# Patient Record
Sex: Female | Born: 1970 | Race: White | Hispanic: No
Health system: Southern US, Community
[De-identification: ages and names within clinical notes are randomized; demographics above are authoritative.]

## PROBLEM LIST (undated history)

## (undated) DIAGNOSIS — I422 Other hypertrophic cardiomyopathy: Secondary | ICD-10-CM

## (undated) DIAGNOSIS — I4819 Other persistent atrial fibrillation: Secondary | ICD-10-CM

## (undated) DIAGNOSIS — R000 Tachycardia, unspecified: Secondary | ICD-10-CM

## (undated) DIAGNOSIS — Z8719 Personal history of other diseases of the digestive system: Secondary | ICD-10-CM

## (undated) DIAGNOSIS — R931 Abnormal findings on diagnostic imaging of heart and coronary circulation: Secondary | ICD-10-CM

## (undated) DIAGNOSIS — R21 Rash and other nonspecific skin eruption: Secondary | ICD-10-CM

## (undated) DIAGNOSIS — I517 Cardiomegaly: Secondary | ICD-10-CM

## (undated) DIAGNOSIS — R011 Cardiac murmur, unspecified: Secondary | ICD-10-CM

## (undated) DIAGNOSIS — T4145XA Adverse effect of unspecified anesthetic, initial encounter: Secondary | ICD-10-CM

## (undated) DIAGNOSIS — I483 Typical atrial flutter: Secondary | ICD-10-CM

## (undated) DIAGNOSIS — Z720 Tobacco use: Secondary | ICD-10-CM

## (undated) DIAGNOSIS — I5030 Unspecified diastolic (congestive) heart failure: Secondary | ICD-10-CM

## (undated) DIAGNOSIS — T8859XA Other complications of anesthesia, initial encounter: Secondary | ICD-10-CM

## (undated) DIAGNOSIS — I639 Cerebral infarction, unspecified: Secondary | ICD-10-CM

## (undated) HISTORY — PX: ABDOMINAL HYSTERECTOMY: SHX81

## (undated) HISTORY — DX: Unspecified diastolic (congestive) heart failure: I50.30

## (undated) HISTORY — DX: Morbid (severe) obesity due to excess calories: E66.01

## (undated) HISTORY — PX: ANKLE SURGERY: SHX546

## (undated) HISTORY — DX: Cardiomegaly: I51.7

## (undated) HISTORY — DX: Cerebral infarction, unspecified: I63.9

## (undated) HISTORY — PX: TUBAL LIGATION: SHX77

## (undated) HISTORY — PX: HERNIA REPAIR: SHX51

## (undated) HISTORY — DX: Cardiac murmur, unspecified: R01.1

## (undated) HISTORY — DX: Tachycardia, unspecified: R00.0

## (undated) HISTORY — DX: Other persistent atrial fibrillation: I48.19

## (undated) HISTORY — DX: Tobacco use: Z72.0

---

## 2005-01-28 ENCOUNTER — Emergency Department: Payer: Self-pay | Admitting: Emergency Medicine

## 2005-01-30 ENCOUNTER — Emergency Department: Payer: Self-pay | Admitting: Emergency Medicine

## 2005-03-16 ENCOUNTER — Inpatient Hospital Stay: Payer: Self-pay | Admitting: Orthopedic Surgery

## 2006-08-28 ENCOUNTER — Ambulatory Visit: Payer: Self-pay | Admitting: Specialist

## 2008-04-24 ENCOUNTER — Emergency Department: Payer: Self-pay | Admitting: Unknown Physician Specialty

## 2008-05-21 ENCOUNTER — Ambulatory Visit: Payer: Self-pay | Admitting: Psychiatry

## 2010-11-14 ENCOUNTER — Ambulatory Visit: Payer: Self-pay | Admitting: Nephrology

## 2011-05-20 ENCOUNTER — Encounter: Payer: Self-pay | Admitting: Cardiovascular Disease

## 2011-05-21 ENCOUNTER — Encounter: Payer: Self-pay | Admitting: Cardiovascular Disease

## 2011-05-21 ENCOUNTER — Inpatient Hospital Stay: Payer: Self-pay | Admitting: *Deleted

## 2011-05-21 DIAGNOSIS — R7989 Other specified abnormal findings of blood chemistry: Secondary | ICD-10-CM

## 2011-05-21 DIAGNOSIS — I059 Rheumatic mitral valve disease, unspecified: Secondary | ICD-10-CM

## 2011-06-05 ENCOUNTER — Encounter: Payer: Self-pay | Admitting: Cardiovascular Disease

## 2011-06-05 ENCOUNTER — Ambulatory Visit (INDEPENDENT_AMBULATORY_CARE_PROVIDER_SITE_OTHER): Payer: 59 | Admitting: Cardiovascular Disease

## 2011-06-05 VITALS — BP 133/84 | HR 67 | Ht 66.0 in | Wt 320.5 lb

## 2011-06-05 DIAGNOSIS — R000 Tachycardia, unspecified: Secondary | ICD-10-CM

## 2011-06-05 DIAGNOSIS — I272 Pulmonary hypertension, unspecified: Secondary | ICD-10-CM

## 2011-06-05 DIAGNOSIS — E669 Obesity, unspecified: Secondary | ICD-10-CM

## 2011-06-05 DIAGNOSIS — I2789 Other specified pulmonary heart diseases: Secondary | ICD-10-CM

## 2011-06-05 DIAGNOSIS — R0602 Shortness of breath: Secondary | ICD-10-CM | POA: Insufficient documentation

## 2011-06-05 DIAGNOSIS — F17200 Nicotine dependence, unspecified, uncomplicated: Secondary | ICD-10-CM

## 2011-06-05 MED ORDER — FUROSEMIDE 40 MG PO TABS: 40.0000 mg | ORAL_TABLET | Freq: Every day | ORAL | Status: DC

## 2011-06-05 MED ORDER — METOPROLOL TARTRATE 25 MG PO TABS: 25.0000 mg | ORAL_TABLET | Freq: Three times a day (TID) | ORAL | Status: DC

## 2011-06-05 MED ORDER — POTASSIUM CHLORIDE CRYS ER 20 MEQ PO TBCR: 20.0000 meq | EXTENDED_RELEASE_TABLET | Freq: Every day | ORAL | Status: DC

## 2011-06-05 NOTE — Assessment & Plan Note (Signed)
We have encouraged continued exercise, careful diet management in an effort to lose weight. 

## 2011-06-05 NOTE — Progress Notes (Signed)
Patient ID: Hannah Zimmerman, female    DOB: 1971/07/21, 40 y.o.   MRN: [id number]  HPI Comments: [name] is a very pleasant 40 year old woman with history of obesity, Smoking, presented to the hospital with palpitations on May 21 2011. She had borderline elevated d-dimer and CT scan showed no embolus. Borderline elevated cardiac enzymes were felt to tachycardia. She was started on low-dose metoprolol tartrate. Echocardiogram showed normal LV systolic function, mild LVH, significantly elevated right ventricular systolic pressures estimated greater than 60 mm of mercury. BNP was 2500. She was started on Lasix 40 mg daily with potassium. She presents to the office to establish care.  Since his discharge, she reports that her breathing has improved. She is able to walk further as well as upstairs with significant improvement in her breathing.  She feels that she is close to her baseline though not 100% there.  Coughing has resolved.  Her heart rate has also been significantly improved on the metoprolol. She does have more breakthrough palpitations and fast heart rate at nighttime.  EKG shows normal sinus rhythm with rate 71 beats per minute with nonspecific ST abnormality in lead V5, V6, one, aVL    Outpatient Encounter Prescriptions as of 06/05/2011  Medication Sig Dispense Refill  . furosemide (LASIX) 40 MG tablet Take 1 tablet (40 mg total) by mouth daily.  90 tablet  4  . metoprolol tartrate (LOPRESSOR) 25 MG tablet Take 1 tablet (25 mg total) by mouth  2(two) times daily.  90 tablet  11  . potassium chloride SA (K-DUR,KLOR-CON) 20 MEQ tablet Take 1 tablet (20 mEq total) by mouth daily.  90 tablet  4      Review of Systems  Constitutional: Negative.   HENT: Negative.   Eyes: Negative.   Respiratory: Positive for shortness of breath.   Cardiovascular: Positive for palpitations.  Gastrointestinal: Negative.   Musculoskeletal: Negative.   Skin: Negative.   Neurological: Negative.     Hematological: Negative.   Psychiatric/Behavioral: Negative.   All other systems reviewed and are negative.    BP 133/84  Pulse 67  Ht 5\' 6"  (1.676 m)  Wt 320 lb 8 oz (145.378 kg)  BMI 51.73 kg/m2  Physical Exam  Nursing note and vitals reviewed. Constitutional: She is oriented to person, place, and time. She appears well-developed and well-nourished.       obese  HENT:  Head: Normocephalic.  Nose: Nose normal.  Mouth/Throat: Oropharynx is clear and moist.  Eyes: Conjunctivae are normal. Pupils are equal, round, and reactive to light.  Neck: Normal range of motion. Neck supple. No JVD present.  Cardiovascular: Normal rate, regular rhythm, S1 normal, S2 normal, normal heart sounds and intact distal pulses.  Exam reveals no gallop and no friction rub.   No murmur heard. Pulmonary/Chest: Effort normal and breath sounds normal. No respiratory distress. She has no wheezes. She has no rales. She exhibits no tenderness.  Abdominal: Soft. Bowel sounds are normal. She exhibits no distension. There is no tenderness.  Musculoskeletal: Normal range of motion. She exhibits no edema and no tenderness.  Lymphadenopathy:    She has no cervical adenopathy.  Neurological: She is alert and oriented to person, place, and time. Coordination normal.  Skin: Skin is warm and dry. No rash noted. No erythema.  Psychiatric: She has a normal mood and affect. Her behavior is normal. Judgment and thought content normal.         Assessment and Plan

## 2011-06-05 NOTE — Assessment & Plan Note (Signed)
Etiology of her pulmonary hypertension is concerning for tachycardia, diastolic dysfunction, or other etiology. Symptoms appear to be improving on diuretic and rate control. She feels well with no complaints. Would not plan a right heart catheterization at this time as she is doing well. We have strongly recommended smoking cessation.

## 2011-06-05 NOTE — Patient Instructions (Signed)
You are doing well. You can take an extra metoprolol at lunch or take 1 1/2 metoprolol late afternnoon Please call us if you have new issues that need to be addressed before your next appt.  We will call you for a follow up Appt. In 6 months]

## 2011-06-05 NOTE — Assessment & Plan Note (Signed)
We have encouraged him to continue to work on weaning his cigarettes and smoking cessation. He will continue to work on this and does not want any assistance with chantix.  

## 2011-06-05 NOTE — Assessment & Plan Note (Signed)
Heart rate has significant improved on metoprolol. She does have occasional breakthrough at nighttime. We have suggested she could try a slightly higher dose of metoprolol, 1-1/2 tablets in the p.m. Or take an additional metoprolol at lunchtime. No Holter ordered at this time. Symptoms have essentially resolved. Uncertain if her tachycardia was from atrial tachycardia secondary to stress or SVT. There did seem to be significant heart rate variation suggestive of atrial tachycardia.

## 2011-06-05 NOTE — Assessment & Plan Note (Signed)
Shortness of breath has improved on Lasix and with improved heart rate. Uncertain if her pulmonary hypertension was secondary to underlying tachycardia. She denies any sleep apnea. We'll not repeat echocardiogram at this time as she is feeling better symptomatically. We will check her basic metabolic panel to ensure that we are not overdiuresing.

## 2011-06-06 ENCOUNTER — Other Ambulatory Visit: Payer: Self-pay | Admitting: Cardiovascular Disease

## 2011-06-06 DIAGNOSIS — R0602 Shortness of breath: Secondary | ICD-10-CM

## 2011-06-11 ENCOUNTER — Encounter: Payer: Self-pay | Admitting: Cardiovascular Disease

## 2012-06-06 ENCOUNTER — Other Ambulatory Visit: Payer: Self-pay | Admitting: Cardiovascular Disease

## 2012-06-06 NOTE — Telephone Encounter (Signed)
Spoke with pt due to pt being overdue for f/u and scheduled appointment. Refilled metoprolol until able to be seen.

## 2012-06-17 ENCOUNTER — Encounter: Payer: Self-pay | Admitting: Cardiovascular Disease

## 2012-06-17 ENCOUNTER — Ambulatory Visit (INDEPENDENT_AMBULATORY_CARE_PROVIDER_SITE_OTHER): Payer: 59 | Admitting: Cardiovascular Disease

## 2012-06-17 VITALS — BP 124/76 | HR 76 | Ht 66.0 in | Wt 330.2 lb

## 2012-06-17 DIAGNOSIS — R002 Palpitations: Secondary | ICD-10-CM

## 2012-06-17 DIAGNOSIS — I272 Pulmonary hypertension, unspecified: Secondary | ICD-10-CM

## 2012-06-17 DIAGNOSIS — E669 Obesity, unspecified: Secondary | ICD-10-CM

## 2012-06-17 DIAGNOSIS — F17200 Nicotine dependence, unspecified, uncomplicated: Secondary | ICD-10-CM

## 2012-06-17 DIAGNOSIS — R000 Tachycardia, unspecified: Secondary | ICD-10-CM

## 2012-06-17 DIAGNOSIS — R0602 Shortness of breath: Secondary | ICD-10-CM

## 2012-06-17 DIAGNOSIS — I2789 Other specified pulmonary heart diseases: Secondary | ICD-10-CM

## 2012-06-17 MED ORDER — POTASSIUM CHLORIDE CRYS ER 10 MEQ PO TBCR: 10.0000 meq | EXTENDED_RELEASE_TABLET | Freq: Two times a day (BID) | ORAL | Status: DC | PRN

## 2012-06-17 MED ORDER — FUROSEMIDE 40 MG PO TABS: 20.0000 mg | ORAL_TABLET | Freq: Two times a day (BID) | ORAL | Status: DC | PRN

## 2012-06-17 MED ORDER — METOPROLOL TARTRATE 25 MG PO TABS: 25.0000 mg | ORAL_TABLET | Freq: Three times a day (TID) | ORAL | Status: DC

## 2012-06-17 MED ORDER — FUROSEMIDE 20 MG PO TABS: 20.0000 mg | ORAL_TABLET | Freq: Two times a day (BID) | ORAL | Status: DC | PRN

## 2012-06-17 NOTE — Assessment & Plan Note (Signed)
Previous history of pulmonary hypertension. We have suggested she stay on Lasix and we will decrease the dose to 20 mg daily and she is taking this sporadically given leg cramping.

## 2012-06-17 NOTE — Assessment & Plan Note (Signed)
Mild shortness of breath at baseline, possibly him deconditioning and obesity. Also with pulmonary hypertension likely related to weight. Continue Lasix.

## 2012-06-17 NOTE — Assessment & Plan Note (Signed)
Heart rate has improved with low-dose beta blockers eared we have suggested she take metoprolol at nighttime for afternoon as she is very fatigued in the morning.

## 2012-06-17 NOTE — Assessment & Plan Note (Signed)
We have encouraged continued exercise, careful diet management in an effort to lose weight. 

## 2012-06-17 NOTE — Assessment & Plan Note (Signed)
We have encouraged her to continue to work on weaning her cigarettes and smoking cessation. She will continue to work on this and does not want any assistance with chantix.  

## 2012-06-17 NOTE — Progress Notes (Signed)
Patient ID: Hannah Zimmerman, female    DOB: [birth year], 41 y.o.   MRN: [id number]  HPI Comments: Hannah Zimmerman is a very pleasant 41 year old woman with history of obesity, Smoking, presented to the hospital with palpitations on May 21 2011. She had borderline elevated d-dimer and CT scan showed no embolus. Borderline elevated cardiac enzymes were felt to tachycardia. She was started on low-dose metoprolol tartrate. Echocardiogram showed normal LV systolic function, mild LVH, significantly elevated right ventricular systolic pressures estimated greater than 60 mm of mercury. BNP was 2500. She was started on Lasix 40 mg daily with potassium. She presents to the office to establish care.  Overall she has been doing well. If she takes Lasix for consecutive days, she has significant leg cramping. Her work schedule has changed any she takes metoprolol in the morning, she has significant fatigue. Mild palpitations, occasional dizziness, occasional lower extreme edema otherwise she has been relatively stable. She continues to battle with her weight. She continues to smoke one pack per day, now rolling her own cigarettes.  EKG shows normal sinus rhythm with rate 76 beats per minute with nonspecific ST abnormality in lead V5, V6, one, aVL    Outpatient Encounter Prescriptions as of 06/17/2012  Medication Sig Dispense Refill  . metoprolol tartrate (LOPRESSOR) 25 MG tablet Take 1 tablet (25 mg total) by mouth 3 (three) times daily.  270 tablet  3  . potassium chloride SA (K-DUR,KLOR-CON) 10 MEQ tablet Take 1 tablet (10 mEq total) by mouth 2 (two) times daily as needed.  180 tablet  3  .  furosemide (LASIX) 40 MG tablet Take 1 tablet (40 mg total) by mouth daily.  90 tablet  4      Review of Systems  Constitutional: Negative.   HENT: Negative.   Eyes: Negative.   Respiratory: Positive for shortness of breath.   Cardiovascular: Positive for palpitations.  Gastrointestinal: Negative.   Musculoskeletal:  Negative.   Skin: Negative.   Neurological: Negative.   Hematological: Negative.   Psychiatric/Behavioral: Negative.   All other systems reviewed and are negative.    BP 124/76  Pulse 76  Ht 5\' 6"  (1.676 m)  Wt 330 lb 4 oz (149.8 kg)  BMI 53.30 kg/m2  Physical Exam  Nursing note and vitals reviewed. Constitutional: She is oriented to person, place, and time. She appears well-developed and well-nourished.       obese  HENT:  Head: Normocephalic.  Nose: Nose normal.  Mouth/Throat: Oropharynx is clear and moist.  Eyes: Conjunctivae normal are normal. Pupils are equal, round, and reactive to light.  Neck: Normal range of motion. Neck supple. No JVD present.  Cardiovascular: Normal rate, regular rhythm, S1 normal, S2 normal, normal heart sounds and intact distal pulses.  Exam reveals no gallop and no friction rub.   No murmur heard. Pulmonary/Chest: Effort normal and breath sounds normal. No respiratory distress. She has no wheezes. She has no rales. She exhibits no tenderness.  Abdominal: Soft. Bowel sounds are normal. She exhibits no distension. There is no tenderness.  Musculoskeletal: Normal range of motion. She exhibits no edema and no tenderness.  Lymphadenopathy:    She has no cervical adenopathy.  Neurological: She is alert and oriented to person, place, and time. Coordination normal.  Skin: Skin is warm and dry. No rash noted. No erythema.  Psychiatric: She has a normal mood and affect. Her behavior is normal. Judgment and thought content normal.         Assessment and Plan

## 2012-06-17 NOTE — Patient Instructions (Signed)
Please cut the lasix in 1/2 (20 mg) and take everyday or every other day Please take  Metoprolol in the late afternoon, or cut in 1/2   Please call us if you have new issues that need to be addressed before your next appt.  Your physician wants you to follow-up in: 12 months.  You will receive a reminder letter in the mail two months in advance. If you don't receive a letter, please call our office to schedule the follow-up appointment.

## 2013-06-09 ENCOUNTER — Ambulatory Visit: Payer: Self-pay | Admitting: Family Medicine

## 2013-08-05 ENCOUNTER — Ambulatory Visit (INDEPENDENT_AMBULATORY_CARE_PROVIDER_SITE_OTHER): Payer: 59 | Admitting: Cardiovascular Disease

## 2013-08-05 ENCOUNTER — Encounter: Payer: Self-pay | Admitting: Cardiovascular Disease

## 2013-08-05 VITALS — BP 140/92 | HR 78 | Ht 66.0 in | Wt 327.5 lb

## 2013-08-05 DIAGNOSIS — I272 Pulmonary hypertension, unspecified: Secondary | ICD-10-CM

## 2013-08-05 DIAGNOSIS — R0602 Shortness of breath: Secondary | ICD-10-CM

## 2013-08-05 DIAGNOSIS — F17200 Nicotine dependence, unspecified, uncomplicated: Secondary | ICD-10-CM

## 2013-08-05 DIAGNOSIS — E669 Obesity, unspecified: Secondary | ICD-10-CM

## 2013-08-05 DIAGNOSIS — R000 Tachycardia, unspecified: Secondary | ICD-10-CM

## 2013-08-05 DIAGNOSIS — I2789 Other specified pulmonary heart diseases: Secondary | ICD-10-CM

## 2013-08-05 NOTE — Patient Instructions (Signed)
You are doing well. No medication changes were made. Continue metoprolol and lasix as needed  Call the office if symptoms of dizziness gets worse Options include a 48 hour or 30 day monitor  Please call us if you have new issues that need to be addressed before your next appt.  Your physician wants you to follow-up in: 12 months.  You will receive a reminder letter in the mail two months in advance. If you don't receive a letter, please call our office to schedule the follow-up appointment.

## 2013-08-05 NOTE — Assessment & Plan Note (Signed)
Encouraged her to stay on her lasix, extra dosing for edema and shortness of breath

## 2013-08-05 NOTE — Assessment & Plan Note (Signed)
We have encouraged continued exercise, careful diet management in an effort to lose weight. 

## 2013-08-05 NOTE — Assessment & Plan Note (Signed)
We have encouraged her to continue to work on weaning her cigarettes and smoking cessation. She will continue to work on this and does not want any assistance with chantix.  

## 2013-08-05 NOTE — Assessment & Plan Note (Signed)
Stable sx, deconditioned, obesity

## 2013-08-05 NOTE — Progress Notes (Signed)
   Patient ID: Hannah Zimmerman, female    DOB: 09/15/1971, 42 y.o.   MRN: [id number]  HPI Comments: [name] is a very pleasant 42 year old woman with history of obesity, continued Smoking, presented to the hospital with palpitations on May 21 2011. She had borderline elevated d-dimer and CT scan showed no embolus. Borderline elevated cardiac enzymes were felt to tachycardia. She was started on low-dose metoprolol tartrate. Echocardiogram showed normal LV systolic function, mild LVH, significantly elevated right ventricular systolic pressures estimated greater than 60 mm of mercury. BNP was 2500. She was started on Lasix 40 mg daily with potassium. She presents for routine follow up.  Overall she has been doing well. she takes metoprolol once a day, lasix frequently.  Mild palpitations, occasional lower extremity edema otherwise she has been relatively stable. She continues to battle with her weight. She continues to smoke one pack per day, rolling her own cigarettes.  She does report having brief dizzy spells after a cough, usually improved with deep breaths. Sometimes happens without a cough, improved again with inspiration.  EKG shows normal sinus rhythm with rate 78 beats per minute with nonspecific ST abnormality in lead  V3 to V6, II, III and AVF    Outpatient Encounter Prescriptions as of 08/05/2013  Medication Sig  . furosemide (LASIX) 20 MG tablet Take 1 tablet (20 mg total) by mouth 2 (two) times daily as needed.  Marland Kitchen LORazepam (ATIVAN) 0.5 MG tablet Take 0.5 mg by mouth as needed for anxiety.  . metoprolol tartrate (LOPRESSOR) 25 MG tablet Take 1 tablet (25 mg total) by mouth 3 (three) times daily.  . potassium chloride SA (K-DUR,KLOR-CON) 10 MEQ tablet Take 1 tablet (10 mEq total) by mouth 2 (two) times daily as needed.      Review of Systems  Constitutional: Negative.   HENT: Negative.   Eyes: Negative.   Gastrointestinal: Negative.   Musculoskeletal: Negative.   Skin:  Negative.   Neurological: Negative.   Psychiatric/Behavioral: Negative.   All other systems reviewed and are negative.   BP 140/92  Pulse 78  Ht 5\' 6"  (1.676 m)  Wt 327 lb 8 oz (148.553 kg)  BMI 52.89 kg/m2  Physical Exam  Nursing note and vitals reviewed. Constitutional: She is oriented to person, place, and time. She appears well-developed and well-nourished.  obese  HENT:  Head: Normocephalic.  Nose: Nose normal.  Mouth/Throat: Oropharynx is clear and moist.  Eyes: Conjunctivae are normal. Pupils are equal, round, and reactive to light.  Neck: Normal range of motion. Neck supple. No JVD present.  Cardiovascular: Normal rate, regular rhythm, S1 normal, S2 normal, normal heart sounds and intact distal pulses.  Exam reveals no gallop and no friction rub.   No murmur heard. Pulmonary/Chest: Effort normal and breath sounds normal. No respiratory distress. She has no wheezes. She has no rales. She exhibits no tenderness.  Abdominal: Soft. Bowel sounds are normal. She exhibits no distension. There is no tenderness.  Musculoskeletal: Normal range of motion. She exhibits no edema and no tenderness.  Lymphadenopathy:    She has no cervical adenopathy.  Neurological: She is alert and oriented to person, place, and time. Coordination normal.  Skin: Skin is warm and dry. No rash noted. No erythema.  Psychiatric: She has a normal mood and affect. Her behavior is normal. Judgment and thought content normal.    Assessment and Plan

## 2013-08-05 NOTE — Assessment & Plan Note (Signed)
Improved sx on metoprolol daily

## 2013-10-20 ENCOUNTER — Inpatient Hospital Stay: Payer: Self-pay | Admitting: Internal Medicine

## 2013-10-20 LAB — BASIC METABOLIC PANEL
Anion Gap: 1 — ABNORMAL LOW (ref 7–16)
BUN: 9 mg/dL (ref 7–18)
Calcium, Total: 9.1 mg/dL (ref 8.5–10.1)
Chloride: 106 mmol/L (ref 98–107)
Co2: 28 mmol/L (ref 21–32)
Creatinine: 0.88 mg/dL (ref 0.60–1.30)
EGFR (African American): >60
EGFR (Non-African Amer.): >60
Glucose: 101 mg/dL — ABNORMAL HIGH (ref 65–99)
Osmolality: 269 (ref 275–301)
Potassium: 4.2 mmol/L (ref 3.5–5.1)
Sodium: 135 mmol/L — ABNORMAL LOW (ref 136–145)

## 2013-10-20 LAB — CBC WITH DIFFERENTIAL/PLATELET
Basophil #: 0.1 10*3/uL (ref 0.0–0.1)
Basophil %: 1 %
Eosinophil #: 0.3 10*3/uL (ref 0.0–0.7)
Eosinophil %: 4 %
HCT: 45.7 % (ref 35.0–47.0)
HGB: 15.5 g/dL (ref 12.0–16.0)
Lymphocyte #: 1.4 10*3/uL (ref 1.0–3.6)
Lymphocyte %: 16.7 %
MCH: 31.5 pg (ref 26.0–34.0)
MCHC: 34 g/dL (ref 32.0–36.0)
MCV: 93 fL (ref 80–100)
Monocyte #: 0.6 x10 3/mm (ref 0.2–0.9)
Monocyte %: 6.9 %
Neutrophil #: 6.1 10*3/uL (ref 1.4–6.5)
Neutrophil %: 71.4 %
Platelet: 337 10*3/uL (ref 150–440)
RBC: 4.93 10*6/uL (ref 3.80–5.20)
RDW: 15 % — ABNORMAL HIGH (ref 11.5–14.5)
WBC: 8.5 10*3/uL (ref 3.6–11.0)

## 2013-10-21 LAB — CBC WITH DIFFERENTIAL/PLATELET
Basophil #: 0.2 10*3/uL — ABNORMAL HIGH (ref 0.0–0.1)
Basophil %: 3.2 %
Eosinophil #: 0.4 10*3/uL (ref 0.0–0.7)
Eosinophil %: 5.9 %
HCT: 41.9 % (ref 35.0–47.0)
HGB: 14.3 g/dL (ref 12.0–16.0)
Lymphocyte #: 1.5 10*3/uL (ref 1.0–3.6)
Lymphocyte %: 20.9 %
MCH: 32 pg (ref 26.0–34.0)
MCHC: 34.2 g/dL (ref 32.0–36.0)
MCV: 94 fL (ref 80–100)
Monocyte #: 0.7 x10 3/mm (ref 0.2–0.9)
Monocyte %: 9.2 %
Neutrophil #: 4.4 10*3/uL (ref 1.4–6.5)
Neutrophil %: 60.8 %
Platelet: 289 10*3/uL (ref 150–440)
RBC: 4.48 10*6/uL (ref 3.80–5.20)
RDW: 15 % — ABNORMAL HIGH (ref 11.5–14.5)
WBC: 7.3 10*3/uL (ref 3.6–11.0)

## 2013-10-21 LAB — BASIC METABOLIC PANEL
Anion Gap: 2 — ABNORMAL LOW (ref 7–16)
BUN: 9 mg/dL (ref 7–18)
Calcium, Total: 8.9 mg/dL (ref 8.5–10.1)
Chloride: 107 mmol/L (ref 98–107)
Co2: 27 mmol/L (ref 21–32)
Creatinine: 0.84 mg/dL (ref 0.60–1.30)
EGFR (African American): >60
EGFR (Non-African Amer.): >60
Glucose: 104 mg/dL — ABNORMAL HIGH (ref 65–99)
Osmolality: 271 (ref 275–301)
Potassium: 4.4 mmol/L (ref 3.5–5.1)
Sodium: 136 mmol/L (ref 136–145)

## 2013-10-25 LAB — CULTURE, BLOOD (SINGLE)

## 2014-11-12 ENCOUNTER — Other Ambulatory Visit (INDEPENDENT_AMBULATORY_CARE_PROVIDER_SITE_OTHER): Payer: Self-pay | Admitting: Surgery

## 2014-12-02 ENCOUNTER — Ambulatory Visit (HOSPITAL_COMMUNITY)
Admission: RE | Admit: 2014-12-02 | Discharge: 2014-12-02 | Disposition: A | Discharge status: Home / Self Care | Payer: 59 | Source: Ambulatory Visit | Attending: Surgery | Admitting: Surgery

## 2014-12-02 ENCOUNTER — Other Ambulatory Visit: Payer: Self-pay

## 2014-12-15 ENCOUNTER — Other Ambulatory Visit: Payer: Self-pay | Admitting: Internal Medicine

## 2014-12-19 ENCOUNTER — Encounter: Payer: Self-pay | Admitting: Dietician

## 2014-12-19 ENCOUNTER — Encounter: Payer: 59 | Attending: Surgery | Admitting: Dietician

## 2014-12-19 VITALS — Ht 64.0 in | Wt 316.6 lb

## 2014-12-19 DIAGNOSIS — E669 Obesity, unspecified: Secondary | ICD-10-CM | POA: Diagnosis not present

## 2014-12-19 DIAGNOSIS — Z713 Dietary counseling and surveillance: Secondary | ICD-10-CM | POA: Diagnosis not present

## 2014-12-19 DIAGNOSIS — Z6841 Body Mass Index (BMI) 40.0 and over, adult: Secondary | ICD-10-CM | POA: Insufficient documentation

## 2014-12-19 NOTE — Progress Notes (Signed)
  Pre-Op Assessment Visit:  Pre-Operative Sleeve Gastrectomy Surgery  Medical Nutrition Therapy:  Appt start time: 4888   End time:  9169.  Patient was seen on 12/19/2014 for Pre-Operative Nutrition Assessment. Assessment and letter of approval faxed to [facility] Bariatric Surgery Program coordinator on 12/19/2014.   Preferred Learning Style:   No preference indicated   Learning Readiness:   Ready  Handouts given during visit include:  Pre-Op Goals Bariatric Surgery Protein Shakes   During the appointment today the following Pre-Op Goals were reviewed with the patient: Maintain or lose weight as instructed by your surgeon Make healthy food choices Begin to limit portion sizes Limited concentrated sugars and fried foods Keep fat/sugar in the single digits per serving on   food labels Practice CHEWING your food  (aim for 30 chews per bite or until applesauce consistency) Practice not drinking 15 minutes before, during, and 30 minutes after each meal/snack Avoid all carbonated beverages  Avoid/limit caffeinated beverages  Avoid all sugar-sweetened beverages Consume 3 meals per day; eat every 3-5 hours Make a list of non-food related activities Aim for 64-100 ounces of FLUID daily  Aim for at least 60-80 grams of PROTEIN daily Look for a liquid protein source that contain ?15 g protein and ?5 g carbohydrate  (ex: shakes, drinks, shots)  Patient-Centered Goals: She would like to be around to be healthy to see her kids grow up (avoid health conditions) and to be able to do more. 9.5 level of confidence/10 importance  Demonstrated degree of understanding via:  Teach Back  Teaching Method Utilized:  Visual Auditory Hands on  Barriers to learning/adherence to lifestyle change: none  Patient to call the [facility] to enroll in Pre-Op and Post-Op Nutrition Education when surgery date is scheduled.

## 2014-12-19 NOTE — Patient Instructions (Signed)

## 2015-01-09 NOTE — Discharge Summary (Signed)
PATIENT NAMETAMIEKA, Hannah Zimmerman MR#:  [id number] DATE OF BIRTH:  Sep 26, 1970  DATE OF ADMISSION:  10/20/2013 DATE OF DISCHARGE:  10/22/2013  ADMITTING PHYSICIAN: Shreyang H. Posey Pronto, MD   DISCHARGING PHYSICIAN: Gladstone Lighter, M.D.   PRIMARY CARE PHYSICIAN: Dr. Ellene Route .  [place]: None.   DISCHARGE DIAGNOSES: 1. Right leg pretibial cellulitis. 2.  Obesity.  3. Atrial tachycardia/history of supraventricular tachycardia.  4. History of pulmonary hypertension and left ventricular hypertrophy.   DISCHARGE HOME MEDICATIONS:  1. Clindamycin 300 mg p.o. q.8 hours for seven more days.  2. Metoprolol 25 mg p.o. 1 to 3 times a day as needed for palpitations.   DISCHARGE DIET: Regular diet.   DISCHARGE ACTIVITY: As tolerated.    FOLLOWUP INSTRUCTIONS: 1. PCP followup in one week.  2. Please watch for any diarrhea with the antibiotic. If diarrhea gets worse please seek medical attention.   LABORATORIES AND IMAGING STUDIES PRIOR TO DISCHARGE: WBC 7.3, hemoglobin 14.3, hematocrit 41.9, platelet count 289.   Sodium 136, potassium 4.4, chloride 107, bicarbonate 27, BUN 9, creatinine 0.84, glucose 104 and calcium of 8.9. Blood cultures are negative.   BRIEF HOSPITAL COURSE: [name] is a 44 year old obese Caucasian female with past medical history significant for history of SVT and palpitations. Presents to the hospital after she had a fall and had cellulitis of the right leg just beneath the knee. She used Bactrim for three days and failed treatment.  1. Right leg cellulitis failed outpatient treatment with Bactrim. She was started on IV vancomycin and Unasyn while in the hospital with some improvement with her symptoms. She is able to ambulate with minimal pain. She is being discharged on clindamycin. Her white count was normal and she has not had any fevers while in the hospital.  2. Atrial tachycardia. The patient was on metoprolol 1 to 3 times a day as needed prior to  admission, which she will continue taking.  3. Her course has been otherwise uneventful in the hospital.   DISCHARGE CONDITION: Stable.   DISCHARGE DISPOSITION: Home.   TIME SPENT ON DISCHARGE: 40 minutes.   ____________________________ Gladstone Lighter, MD rk:sg D: 10/22/2013 13:53:00 ET T: 10/22/2013 14:07:26 ET JOB#: [id number]  cc: Gladstone Lighter, MD, <Dictator> [name], MD Raye Sorrow, MD   Gladstone Lighter MD ELECTRONICALLY SIGNED 10/23/2013 14:59

## 2015-01-09 NOTE — H&P (Signed)
PATIENT NAMELITTIE, Hannah Zimmerman MR#:  [id number] DATE OF BIRTH:  May 13, 1971  DATE OF ADMISSION:  10/20/2013  PRIMARY CARE PROVIDER: Dr. Jeananne Rama.   EMERGENCY DEPARTMENT REFERRING PHYSICIAN: Dr. Mariea Clonts.   CHIEF COMPLAINT: Right pretibial area erythema.   HISTORY OF PRESENT ILLNESS: The patient is a 44 year old white female with previous history of atrial tachycardia, history of LVH with pulmonary hypertension noticed on previous echo, who has been doing well except about 2-1/2  ago, she fell on a concrete floor and scraped her leg. Subsequently developed an area of hardness and then started noticing erythema a few days ago. She was seen outpatient, was started on treatment with Bactrim and 1 dose of Rocephin. Her leg area continued to get worse with erythema and warmth; therefore, she came to the ED. We were asked to admit the patient for failed outpatient therapy for the right pretibial area cellulitis. The patient otherwise has had low-grade fevers, has not had any chest pains, palpitations or shortness of breath. No nausea, vomiting or diarrhea. Denies any urinary frequency, urgency or hesitancy.   PAST MEDICAL HISTORY: Morbid obesity, history of atrial tachycardia/SVT, history of LVH with pulmonary hypertension.   ALLERGIES: CODEINE AND HYDROCODONE.   CURRENT MEDICATIONS: She is on Bactrim double-strength 1 tab p.o. b.i.d., metoprolol tartrate 25 mg 1 tab p.o. t.i.d.   SOCIAL HISTORY: The patient is smoking 1/2 pack per day. Denies any alcohol or drug use.   FAMILY HISTORY: Father in 62s. Mother has hypertension and diabetes.   REVIEW OF SYSTEMS:    CONSTITUTIONAL: Complains of low-grade fevers, some pain in the right leg shin. No weight loss. No weight gain.  EYES: No blurred or double vision. No pain. No redness. No inflammation. No glaucoma.  EARS, NOSE, THROAT: No tinnitus. No ear pain. No hearing loss. No seasonal or year-round allergies. No epistaxis. No difficulty with swallowing.   RESPIRATORY: Denies any cough, wheezing, hemoptysis. No dyspnea.  CARDIOVASCULAR: Denies any chest pain, orthopnea, edema or arrhythmia. No palpitations.  GASTROINTESTINAL: No nausea, vomiting, diarrhea. No abdominal pain. No hematemesis. No melena. No ulcer. No GERD. No IBS. No jaundice.  GENITOURINARY: Denies any dysuria, hematuria, renal calculus or frequency.  ENDOCRINE: Denies any polyuria, nocturia or thyroid problems.  HEMATOLOGIC AND LYMPHATIC: Denies anemia, easy bruisability or bleeding.  SKIN: No acne. No rash. No changes in mole, hair or skin.  MUSCULOSKELETAL: No pain in neck, back or shoulder.  NEUROLOGIC: No numbness. No CVA. No TIA.  PSYCHIATRIC: No anxiety. No insomnia. No ADD.   PHYSICAL EXAMINATION: VITAL SIGNS: Temperature 97.7, pulse 86, respirations 18, blood pressure 123/71.  GENERAL: The patient is an obese female in no acute distress.  HEENT: Head atraumatic, normocephalic. Pupils equally round, reactive to light and accommodation. There is no conjunctival pallor. No scleral icterus. Nasal exam shows no drainage or ulceration. Oropharynx is clear without any exudate.  NECK: Supple without any JVD.  CARDIOVASCULAR: Regular rate and rhythm. No murmurs, rubs, clicks or gallops. PMI is not displaced.  LUNGS: Clear to auscultation bilaterally without any rales, rhonchi, wheezing.  ABDOMEN: Soft, nontender, nondistended. Positive bowel sounds x 4.  EXTREMITIES: No clubbing, cyanosis, edema.  SKIN: No rash.  LYMPHATICS: No lymph nodes palpable.  VASCULAR: Good DP, PT pulses.  SKIN: Right leg shin area: There is a large area of erythema and warmth that is extending below her knee.  NEUROLOGIC: Awake, alert, oriented x 3. No focal deficits.  PSYCHIATRIC: Not anxious or depressed.   LABORATORY DATA:  Glucose 101, BUN 9, creatinine 0.88, sodium 135, potassium 4.2, chloride 106; CO2 is 28, calcium 9.1. WBC 8.5, hemoglobin 15.5, platelet count 337.   ASSESSMENT AND PLAN: The  patient is a 44 year old white female with history of atrial tachycardia, comes in with a right pretibial area of cellulitis.  1.  Pretibial cellulitis, failed outpatient therapy: At this time, we will treat her with IV Unasyn. Obtain blood cultures. Follow her clinically. I expect she should be able to be discharged in the next 48 hours after receiving sufficient IV antibiotics.  2.  History of atrial tachycardia: Will continue metoprolol as taking at home.  3.  Nicotine addiction: The patient was counseled regarding smoking cessation, 4 minutes spent. The patient reports that she cannot tolerate nicotine substitutes, gives her severe headache. I strongly recommended that she stop smoking.    TIME SPENT: 50 minutes.     ____________________________ [name], MD shp:jcm D: 10/20/2013 15:50:22 ET T: 10/20/2013 16:11:09 ET JOB#: [id number]  cc: Hannah Zimmerman H. Posey Pronto, MD, <Dictator> Alric Seton MD ELECTRONICALLY SIGNED 11/02/2013 13:08

## 2015-01-18 ENCOUNTER — Encounter: Payer: 59 | Attending: Surgery | Admitting: Dietician

## 2015-01-18 ENCOUNTER — Encounter: Payer: Self-pay | Admitting: Dietician

## 2015-01-18 VITALS — Ht 64.0 in | Wt 312.1 lb

## 2015-01-18 DIAGNOSIS — Z6841 Body Mass Index (BMI) 40.0 and over, adult: Secondary | ICD-10-CM | POA: Diagnosis not present

## 2015-01-18 DIAGNOSIS — E669 Obesity, unspecified: Secondary | ICD-10-CM | POA: Diagnosis not present

## 2015-01-18 DIAGNOSIS — Z713 Dietary counseling and surveillance: Secondary | ICD-10-CM | POA: Insufficient documentation

## 2015-01-18 NOTE — Progress Notes (Signed)
  Supervised Weight Loss:  Appt start time: 455 end time:  510  SWL visit 1:  Primary concerns today: Damali returns having lost 4 pounds since last visit in preparation for gastric sleeve. Started taking sea kelp supplement. She also reports she has been active overall, "chasing kids" and walking 2x a week. She has been mindful of chewing more, cutting back on caffeine, and trying to drink more water. Also cutting back on sugar in tea to try to get used to having less. Cannot tolerate Splenda.  Weight: 312.1 lbs BMI: 53.7  Goal:  -Work on increasing water intake -Aim to walk at least 2x a week  MEDICATIONS: see list  DIETARY INTAKE:  24-hr recall:  B ( AM): vitamins, eggs or egg sandwich  Snk ( AM) :  L (1 PM): sandwich  Snk ( PM): sometimes pretzels D ( PM): meat, vegetable and starch  Snk ( PM):  Beverages: water or water with crystal light, sweet tea  Recent physical activity: walking, inconsistent   Estimated energy needs: 1600-1800 calories  Progress Towards Goal(s):  In progress.   Nutritional Diagnosis:  [place]-3.3 Overweight/obesity related to past poor dietary habits and physical inactivity as evidenced by patient in SWL for pending bariatric surgery following dietary guidelines for continued weight loss.     Intervention:  Nutrition counseling provided.  Monitoring/Evaluation:  Dietary intake, exercise, and body weight in 4 week(s).

## 2015-02-18 ENCOUNTER — Encounter: Payer: 59 | Attending: Surgery | Admitting: Dietician

## 2015-02-18 ENCOUNTER — Encounter: Payer: Self-pay | Admitting: Dietician

## 2015-02-18 VITALS — Ht 64.0 in | Wt 312.9 lb

## 2015-02-18 DIAGNOSIS — E669 Obesity, unspecified: Secondary | ICD-10-CM | POA: Diagnosis not present

## 2015-02-18 DIAGNOSIS — Z713 Dietary counseling and surveillance: Secondary | ICD-10-CM | POA: Insufficient documentation

## 2015-02-18 DIAGNOSIS — Z6841 Body Mass Index (BMI) 40.0 and over, adult: Secondary | ICD-10-CM | POA: Diagnosis not present

## 2015-02-18 NOTE — Progress Notes (Signed)
  Supervised Weight Loss:  Appt start time: 455 end time:  510  SWL visit 2:  Primary concerns today:  Started taking sea kelp supplement. She also reports she has been active overall, "chasing kids" and walking 2x a week. She has been mindful of chewing more, cutting back on caffeine, and trying to drink more water. Also cutting back on sugar in tea to try to get used to having less. Cannot tolerate Splenda.  Zylah returns having maintained her weight since last month. She reports that she has gotten at least an extra glass of water per day and is still working on reducing caffeine (has cut it in half). She is trying to walk more but schedule is busy with work and 3 kids.  She has a friend that will start walking with her this summer. Verta has a lot of support with friends and family.   Weight: 312.9 lbs BMI: 53.8  Goal:  -Work on increasing water intake -Aim to walk at least 3x a week  MEDICATIONS: see list  DIETARY INTAKE:  24-hr recall:  B ( AM): vitamins, eggs or egg sandwich  Snk ( AM) :  L (1 PM): sandwich  Snk ( PM): sometimes pretzels D ( PM): meat, vegetable and starch  Snk ( PM):  Beverages: water or water with crystal light, sweet tea  Recent physical activity: walking, inconsistent   Estimated energy needs: 1600-1800 calories  Progress Towards Goal(s):  In progress.   Nutritional Diagnosis:  [place]-3.3 Overweight/obesity related to past poor dietary habits and physical inactivity as evidenced by patient in SWL for pending bariatric surgery following dietary guidelines for continued weight loss.     Intervention:  Nutrition counseling provided.  Monitoring/Evaluation:  Dietary intake, exercise, and body weight in 4 week(s).

## 2015-03-23 ENCOUNTER — Encounter: Payer: 59 | Attending: Surgery | Admitting: Dietician

## 2015-03-23 ENCOUNTER — Encounter: Payer: Self-pay | Admitting: Dietician

## 2015-03-23 VITALS — Ht 64.0 in | Wt 314.1 lb

## 2015-03-23 DIAGNOSIS — Z6841 Body Mass Index (BMI) 40.0 and over, adult: Secondary | ICD-10-CM | POA: Insufficient documentation

## 2015-03-23 DIAGNOSIS — E669 Obesity, unspecified: Secondary | ICD-10-CM | POA: Diagnosis present

## 2015-03-23 DIAGNOSIS — Z713 Dietary counseling and surveillance: Secondary | ICD-10-CM | POA: Diagnosis not present

## 2015-03-23 NOTE — Patient Instructions (Signed)
Goal:  -Aim to walk at least 3x a week and/or 10,000 steps -Work on not drinking during meals -Try protein shakes

## 2015-03-23 NOTE — Progress Notes (Signed)
  Supervised Weight Loss:  Appt start time: 500 end time:  515  SWL visit 3:  Primary concerns today: Returns with a 2 lbs weight gain. Husband has been sick which has been making home life hard now. Tracking steps on her phone and averaging between 8000-10000 steps per day. Feels like she is chewing well. Having one glass of tea per and has been cutting down. Drinking more water. Not having sugar/fried foods. Working on not drinking during meals. Has not tried protein shakes yet.  Weight: 314.1 lbs BMI: 53.9  Goal:  -Aim to walk at least 3x a week and/or 10,000 steps -Work on not drinking during meals -Try protein shakes  MEDICATIONS: see list  DIETARY INTAKE:  24-hr recall:  B ( AM): vitamins, eggs or egg sandwich  Snk ( AM) :  L (1 PM): sandwich  Snk ( PM): sometimes pretzels D ( PM): meat, vegetable and starch  Snk ( PM):  Beverages: water or water with crystal light, sweet tea  Recent physical activity: walking, inconsistent   Estimated energy needs: 1600-1800 calories  Progress Towards Goal(s):  In progress.   Nutritional Diagnosis:  [place]-3.3 Overweight/obesity related to past poor dietary habits and physical inactivity as evidenced by patient in SWL for pending bariatric surgery following dietary guidelines for continued weight loss.     Intervention:  Nutrition counseling provided.  Monitoring/Evaluation:  Dietary intake, exercise, and body weight in 4 week(s).

## 2015-04-20 ENCOUNTER — Encounter: Payer: Self-pay | Admitting: Dietician

## 2015-04-20 ENCOUNTER — Encounter: Payer: 59 | Attending: Surgery | Admitting: Dietician

## 2015-04-20 VITALS — Ht 64.0 in | Wt 315.7 lb

## 2015-04-20 DIAGNOSIS — E669 Obesity, unspecified: Secondary | ICD-10-CM | POA: Diagnosis not present

## 2015-04-20 DIAGNOSIS — Z6841 Body Mass Index (BMI) 40.0 and over, adult: Secondary | ICD-10-CM | POA: Diagnosis not present

## 2015-04-20 DIAGNOSIS — Z713 Dietary counseling and surveillance: Secondary | ICD-10-CM | POA: Diagnosis not present

## 2015-04-20 NOTE — Patient Instructions (Signed)
Goal:  -Aim to walk at least 3 x a week and/or 10,000 steps -Work on not drinking during meals and chewing well -Try Premier Protein and Engineer, agricultural

## 2015-04-20 NOTE — Progress Notes (Signed)
  Supervised Weight Loss:  Appt start time: 445 end time:  500  SWL visit 3:  Primary concerns today: Returns with a 1 lbs weight gain. Feels like she is hold onto to some fluid. Has tried Protein shakes. Tracking steps on her phone and averaging between 9000 steps per day. Still having one glass of tea per day and has cut back on the sugar in the tea. Has been working on not drinking during meals which is hard.  Weight: 315.7 lbs BMI: 54.2  Goal:  -Aim to walk at least 3x a week and/or 10,000 steps -Work on not drinking during meals -Try Premier Protein  MEDICATIONS: see list  DIETARY INTAKE:  24-hr recall:  B ( AM): vitamins, eggs or egg sandwich  Snk ( AM) :  L (1 PM): sandwich  Snk ( PM): sometimes pretzels D ( PM): meat, vegetable and starch  Snk ( PM):  Beverages: water or water with crystal light, sweet tea  Recent physical activity: walking, inconsistent   Estimated energy needs: 1600-1800 calories  Progress Towards Goal(s):  In progress.   Nutritional Diagnosis:  [place]-3.3 Overweight/obesity related to past poor dietary habits and physical inactivity as evidenced by patient in SWL for pending bariatric surgery following dietary guidelines for continued weight loss.     Intervention:  Nutrition counseling provided.  Monitoring/Evaluation:  Dietary intake, exercise, and body weight in 4 week(s).

## 2015-05-25 ENCOUNTER — Encounter: Payer: 59 | Attending: Surgery | Admitting: Dietician

## 2015-05-25 VITALS — Ht 64.0 in | Wt 316.0 lb

## 2015-05-25 DIAGNOSIS — Z6841 Body Mass Index (BMI) 40.0 and over, adult: Secondary | ICD-10-CM | POA: Insufficient documentation

## 2015-05-25 DIAGNOSIS — E669 Obesity, unspecified: Secondary | ICD-10-CM

## 2015-05-25 DIAGNOSIS — Z713 Dietary counseling and surveillance: Secondary | ICD-10-CM | POA: Insufficient documentation

## 2015-05-25 NOTE — Progress Notes (Signed)
  Supervised Weight Loss:  Appt start time: 500 end time:  515  SWL visit 5:  Primary concerns today: Returns having maintained her weight. She is still working on reducing sugar in her tea and still only drinking 1 glass per day. Continues to get 8,000-10,000 steps per day. Tried Atkins Lift protein drinks and likes them. Has been working on not drinking while eating; still sometimes sips. Feels like she has been doing a good job chewing thoroughly. Working on quitting smoking (1-2 cigarettes per day).   Weight: 315.7 lbs BMI: 54.2  Goal:  -Work on not drinking during meals  MEDICATIONS: see list  DIETARY INTAKE:  24-hr recall:  B ( AM): vitamins, eggs or egg sandwich  Snk ( AM) :  L (1 PM): sandwich  Snk ( PM): sometimes pretzels D ( PM): meat, vegetable and starch  Snk ( PM):  Beverages: water or water with crystal light, 1 glass sweet tea per day  Recent physical activity: walking, inconsistent   Estimated energy needs: 1600-1800 calories  Progress Towards Goal(s):  In progress.   Nutritional Diagnosis:  -3.3 Overweight/obesity related to past poor dietary habits and physical inactivity as evidenced by patient in SWL for pending bariatric surgery following dietary guidelines for continued weight loss.     Intervention:  Nutrition counseling provided.  Monitoring/Evaluation:  Dietary intake, exercise, and body weight in 4 week(s).

## 2015-05-25 NOTE — Patient Instructions (Addendum)
-  Keep working on not drinking while eating

## 2015-05-26 ENCOUNTER — Encounter: Payer: Self-pay | Admitting: Dietician

## 2015-06-15 ENCOUNTER — Ambulatory Visit (INDEPENDENT_AMBULATORY_CARE_PROVIDER_SITE_OTHER): Payer: 59 | Admitting: Psychiatry

## 2015-06-21 ENCOUNTER — Encounter: Payer: Self-pay | Admitting: Dietician

## 2015-06-21 ENCOUNTER — Encounter: Payer: 59 | Attending: Surgery | Admitting: Dietician

## 2015-06-21 VITALS — Ht 64.0 in | Wt 318.8 lb

## 2015-06-21 DIAGNOSIS — E669 Obesity, unspecified: Secondary | ICD-10-CM | POA: Diagnosis not present

## 2015-06-21 DIAGNOSIS — Z6841 Body Mass Index (BMI) 40.0 and over, adult: Secondary | ICD-10-CM | POA: Diagnosis not present

## 2015-06-21 DIAGNOSIS — Z713 Dietary counseling and surveillance: Secondary | ICD-10-CM | POA: Diagnosis not present

## 2015-06-21 NOTE — Progress Notes (Signed)
  Supervised Weight Loss:  Appt start time: 500 end time:  515  SWL visit 6:  Primary concerns today: Returns having gained 3 pounds. She states she feels ready for surgery. She has not had a cigarette in 2.5 weeks. Scottlyn feels like she has gotten in the habit of chewing thoroughly. She finds it difficult not to have a sip of liquid during her meals but has mastered this for the most part.   Weight: 318.8 lbs BMI: 54.8  Goal:  -Work on not drinking during meals  MEDICATIONS: see list  DIETARY INTAKE:  24-hr recall:  B ( AM): vitamins, eggs or egg sandwich  Snk ( AM) :  L (1 PM): sandwich  Snk ( PM): sometimes pretzels D ( PM): meat, vegetable and starch  Snk ( PM):  Beverages: water or water with crystal light, 1 glass sweet tea per day  Recent physical activity: walking, inconsistent   Estimated energy needs: 1600-1800 calories  Progress Towards Goal(s):  In progress.   Nutritional Diagnosis:  [place]-3.3 Overweight/obesity related to past poor dietary habits and physical inactivity as evidenced by patient in SWL for pending bariatric surgery following dietary guidelines for continued weight loss.     Intervention:  Nutrition counseling provided.  Monitoring/Evaluation:  Dietary intake, exercise, and body weight in 4 week(s).

## 2015-06-29 ENCOUNTER — Ambulatory Visit (INDEPENDENT_AMBULATORY_CARE_PROVIDER_SITE_OTHER): Payer: 59 | Admitting: Psychiatry

## 2015-08-02 ENCOUNTER — Encounter: Payer: 59 | Attending: Surgery

## 2015-08-02 VITALS — Ht 64.0 in | Wt 316.0 lb

## 2015-08-02 DIAGNOSIS — Z6841 Body Mass Index (BMI) 40.0 and over, adult: Secondary | ICD-10-CM | POA: Diagnosis not present

## 2015-08-02 DIAGNOSIS — E669 Obesity, unspecified: Secondary | ICD-10-CM | POA: Insufficient documentation

## 2015-08-02 DIAGNOSIS — Z713 Dietary counseling and surveillance: Secondary | ICD-10-CM | POA: Insufficient documentation

## 2015-08-02 NOTE — Progress Notes (Signed)
  Pre-Operative Nutrition Class:  Appt start time: 9800   End time:  1830.  Patient was seen on 08/02/15 for Pre-Operative Bariatric Surgery Education at [facility].   Surgery date:  Surgery type: Sleeve gastrectomy Start weight at [facility]: 316.5 lbs on 12/19/14 Weight today: 316 lbs  TANITA  BODY COMP RESULTS  08/02/15   BMI (kg/m^2) 54.2   Fat Mass (lbs) 161.5   Fat Free Mass (lbs) 154.5   Total Body Water (lbs) 113   Samples given per MNT protocol. Patient educated on appropriate usage: Premier protein shake (vanilla - qty 1) Lot #: [id number] Exp: 06/2016  Celebrate Calcium Citrate chew (chocolate - qty 1) Lot #: [id number] Exp: 11/2016  Celebrate Vitamins Multivitamin chew (berry - qty 1) Lot #: [id number] Exp: 11/2016  PB2 (qty 1) Lot #: [id number] Exp:06/2017  Unjury Protein Powder (chicken soup - qty 1) Lot #: [id number] Exp: 03/2016  The following the learning objectives were met by the patient during this course:  Identify Pre-Op Dietary Goals and will begin 2 weeks pre-operatively  Identify appropriate sources of fluids and proteins   State protein recommendations and appropriate sources pre and post-operatively  Identify Post-Operative Dietary Goals and will follow for 2 weeks post-operatively  Identify appropriate multivitamin and calcium sources  Describe the need for physical activity post-operatively and will follow MD recommendations  State when to call healthcare provider regarding medication questions or post-operative complications  Handouts given during class include:  Pre-Op Bariatric Surgery Diet Handout  Protein Shake Handout  Post-Op Bariatric Surgery Nutrition Handout  BELT Program Information Flyer  Support Group Information Flyer  [facility] Bariatric Supplements Price List  Follow-Up Plan: Patient will follow-up at [facility] 2 weeks post operatively for diet advancement per MD.

## 2015-08-06 NOTE — Progress Notes (Signed)
Chest x ray 3/16 epic.  At pre op phone call scheduling patient stated had been seen by cardio- Dr Rockey Situ [phone] for "irregular heart beat"  Stated nothing changed in the two years she saw him and hasnt had to go back.  States is not on any heart medicine

## 2015-08-06 NOTE — Progress Notes (Signed)
ekg 3/16 epic.  Patient states was not required to have any clearances prior to surgery

## 2015-08-06 NOTE — Patient Instructions (Addendum)
SHAUNTAI GALPERIN  08/06/2015   Your procedure is scheduled on: 08-17-15  Report to [facility]  Entrance take [facility]  elevators to 3rd floor to  [place] at 745 AM.  Call this number if you have problems the morning of surgery [phone]   Remember: ONLY 1 PERSON MAY GO WITH YOU TO SHORT STAY TO GET  READY MORNING OF [place].  Do not eat food or drink liquids :After Midnight.     Take these medicines the morning of surgery with A SIP OF WATER: none                               You may not have any metal on your body including hair pins and              piercings  Do not wear jewelry, make-up, lotions, powders or perfumes, deodorant             Do not wear nail polish.  Do not shave  48 hours prior to surgery.              Men may shave face and neck.   Do not bring valuables to the hospital. [place].  Contacts, dentures or bridgework may not be worn into surgery.  Leave suitcase in the car. After surgery it may be brought to your room.     Patients discharged the day of surgery will not be allowed to drive home.  Name and phone number of your driver:  Special Instructions: N/A              Please read over the following fact sheets you were given: _____________________________________________________________________             [facility] - Preparing for Surgery Before surgery, you can play an important role.  Because skin is not sterile, your skin needs to be as free of germs as possible.  You can reduce the number of germs on your skin by washing with CHG (chlorahexidine gluconate) soap before surgery.  CHG is an antiseptic cleaner which kills germs and bonds with the skin to continue killing germs even after washing. Please DO NOT use if you have an allergy to CHG or antibacterial soaps.  If your skin becomes reddened/irritated stop using the CHG and inform your nurse when you  arrive at Short Stay. Do not shave (including legs and underarms) for at least 48 hours prior to the first CHG shower.  You may shave your face/neck. Please follow these instructions carefully:  1.  Shower with CHG Soap the night before surgery and the  morning of Surgery.  2.  If you choose to wash your hair, wash your hair first as usual with your  normal  shampoo.  3.  After you shampoo, rinse your hair and body thoroughly to remove the  shampoo.                           4.  Use CHG as you would any other liquid soap.  You can apply chg directly  to the skin and wash  Gently with a scrungie or clean washcloth.  5.  Apply the CHG Soap to your body ONLY FROM THE NECK DOWN.   Do not use on face/ open                           Wound or open sores. Avoid contact with eyes, ears mouth and genitals (private parts).                       Wash face,  Genitals (private parts) with your normal soap.             6.  Wash thoroughly, paying special attention to the area where your surgery  will be performed.  7.  Thoroughly rinse your body with warm water from the neck down.  8.  DO NOT shower/wash with your normal soap after using and rinsing off  the CHG Soap.                9.  Pat yourself dry with a clean towel.            10.  Wear clean pajamas.            11.  Place clean sheets on your bed the night of your first shower and do not  sleep with pets. Day of Surgery : Do not apply any lotions/deodorants the morning of surgery.  Please wear clean clothes to the hospital/surgery center.  FAILURE TO FOLLOW THESE INSTRUCTIONS MAY RESULT IN THE CANCELLATION OF YOUR SURGERY PATIENT SIGNATURE_________________________________  NURSE SIGNATURE__________________________________  ________________________________________________________________________

## 2015-08-09 ENCOUNTER — Ambulatory Visit: Payer: Self-pay | Admitting: Surgery

## 2015-08-09 ENCOUNTER — Encounter (HOSPITAL_COMMUNITY)
Admission: RE | Admit: 2015-08-09 | Discharge: 2015-08-09 | Disposition: A | Discharge status: Home / Self Care | Payer: 59 | Source: Ambulatory Visit | Attending: Surgery | Admitting: Surgery

## 2015-08-09 ENCOUNTER — Encounter (HOSPITAL_COMMUNITY): Payer: Self-pay

## 2015-08-09 DIAGNOSIS — Z01818 Encounter for other preprocedural examination: Secondary | ICD-10-CM | POA: Diagnosis not present

## 2015-08-09 HISTORY — DX: Rash and other nonspecific skin eruption: R21

## 2015-08-09 HISTORY — DX: Other complications of anesthesia, initial encounter: T88.59XA

## 2015-08-09 HISTORY — DX: Adverse effect of unspecified anesthetic, initial encounter: T41.45XA

## 2015-08-09 LAB — CBC
HCT: 46.5 % — ABNORMAL HIGH (ref 36.0–46.0)
Hemoglobin: 15.7 g/dL — ABNORMAL HIGH (ref 12.0–15.0)
MCH: 30.5 pg (ref 26.0–34.0)
MCHC: 33.8 g/dL (ref 30.0–36.0)
MCV: 90.5 fL (ref 78.0–100.0)
Platelets: 352 10*3/uL (ref 150–400)
RBC: 5.14 MIL/uL — ABNORMAL HIGH (ref 3.87–5.11)
RDW: 14.7 % (ref 11.5–15.5)
WBC: 11.3 10*3/uL — ABNORMAL HIGH (ref 4.0–10.5)

## 2015-08-09 LAB — BASIC METABOLIC PANEL
Anion gap: 8 (ref 5–15)
BUN: 20 mg/dL (ref 6–20)
CO2: 26 mmol/L (ref 22–32)
Calcium: 9.5 mg/dL (ref 8.9–10.3)
Chloride: 105 mmol/L (ref 101–111)
Creatinine, Ser: 0.64 mg/dL (ref 0.44–1.00)
GFR calc Af Amer: >60 mL/min (ref >60)
GFR calc non Af Amer: >60 mL/min (ref >60)
Glucose, Bld: 94 mg/dL (ref 65–99)
Potassium: 4.6 mmol/L (ref 3.5–5.1)
Sodium: 139 mmol/L (ref 135–145)

## 2015-08-09 NOTE — Progress Notes (Signed)
Spoke with dr Delma Post and made aware pt history and ekg results from 12-02-14 and 08-05-13, pt ok for surgery per dr Noel Gerold.

## 2015-08-10 ENCOUNTER — Ambulatory Visit: Payer: Self-pay | Admitting: Surgery

## 2015-08-10 ENCOUNTER — Other Ambulatory Visit (HOSPITAL_COMMUNITY): Payer: Self-pay | Admitting: *Deleted

## 2015-08-10 DIAGNOSIS — I272 Pulmonary hypertension, unspecified: Secondary | ICD-10-CM

## 2015-08-10 DIAGNOSIS — E669 Obesity, unspecified: Secondary | ICD-10-CM

## 2015-08-10 NOTE — H&P (Signed)
Chief Complaint:  Morbid obesity BMI 50  History of Present Illness:  Hannah Zimmerman is an 44 y.o. female who was seen by me initially back in February when she presented for bariatric evaluation.  She has been studying surgery for about 5 years.  She wants to proceed with sleeve gastrectomy and is admitted at this time for that operation .  She denies GERD and has had 3 C sections and hysterectomy  Past Medical History  Diagnosis Date  . Tachycardia   . LVH (left ventricular hypertrophy)     with pulmonary hypertension  . Complication of anesthesia 20 years ago    spinal with first c section went too high stopped breathing, low bp with 2nd c section, no further issues with anesthesia  . Rash     both arms from old bed bug bites, healing    Past Surgical History  Procedure Laterality Date  . Abdominal hysterectomy    . Tubal ligation      x 2  . Cesarean section      x 3  . Ankle surgery      left x 2    Current Outpatient Prescriptions  Medication Sig Dispense Refill  . calcium citrate-vitamin D (CITRACAL+D) 315-200 MG-UNIT tablet Take 1 tablet by mouth 2 (two) times daily.    Marland Kitchen ibuprofen (ADVIL,MOTRIN) 600 MG tablet Take 600 mg by mouth every 8 (eight) hours as needed (pain).     . Multiple Vitamin (MULTIVITAMIN WITH MINERALS) TABS tablet Take 1 tablet by mouth 2 (two) times daily.     No current facility-administered medications for this visit.   Codeine and Hydrocodone Family History  Problem Relation Age of Onset  . Hypertension Mother    Social History:   reports that she quit smoking about 3 months ago. Her smoking use included Cigarettes. She has a 20 pack-year smoking history. She has never used smokeless tobacco. She reports that she drinks about 0.5 oz of alcohol per week. She reports that she does not use illicit drugs.   REVIEW OF SYSTEMS : Negative except for see problem list  Physical Exam:   There were no vitals taken for this visit. There is no weight  on file to calculate BMI.--BMI 50   Gen:  WDWN WF NAD  Neurological: Alert and oriented to person, place, and time. Motor and sensory function is grossly intact  Head: Normocephalic and atraumatic.  Eyes: Conjunctivae are normal. Pupils are equal, round, and reactive to light. No scleral icterus.  Neck: Normal range of motion. Neck supple. No tracheal deviation or thyromegaly present.  Cardiovascular:  SR without murmurs or gallops.  No carotid bruits Breast:  Not examined Respiratory: Effort normal.  No respiratory distress. No chest wall tenderness. Breath sounds normal.  No wheezes, rales or rhonchi.  Abdomen:  nontender  GU:  Not examinted Musculoskeletal: Normal range of motion. Extremities are nontender. No cyanosis, edema or clubbing noted Lymphadenopathy: No cervical, preauricular, postauricular or axillary adenopathy is present Skin: Skin is warm and dry. No rash noted. No diaphoresis. No erythema. No pallor. Pscyh: Normal mood and affect. Behavior is normal. Judgment and thought content normal.   LABORATORY RESULTS: Results for orders placed or performed during the hospital encounter of 08/09/15 (from the past 48 hour(s))  Basic metabolic panel     Status: None   Collection Time: 08/09/15 11:35 AM  Result Value Ref Range   Sodium 139 135 - 145 mmol/L   Potassium 4.6 3.5 -  5.1 mmol/L   Chloride 105 101 - 111 mmol/L   CO2 26 22 - 32 mmol/L   Glucose, Bld 94 65 - 99 mg/dL   BUN 20 6 - 20 mg/dL   Creatinine, Ser 0.64 0.44 - 1.00 mg/dL   Calcium 9.5 8.9 - 10.3 mg/dL   GFR calc non Af Amer >60 >60 mL/min   GFR calc Af Amer >60 >60 mL/min    Comment: (NOTE) The eGFR has been calculated using the CKD EPI equation. This calculation has not been validated in all clinical situations. eGFR's persistently <60 mL/min signify possible Chronic Kidney Disease.    Anion gap 8 5 - 15  CBC     Status: Abnormal   Collection Time: 08/09/15 11:35 AM  Result Value Ref Range   WBC 11.3 (H)  4.0 - 10.5 K/uL   RBC 5.14 (H) 3.87 - 5.11 MIL/uL   Hemoglobin 15.7 (H) 12.0 - 15.0 g/dL   HCT 46.5 (H) 36.0 - 46.0 %   MCV 90.5 78.0 - 100.0 fL   MCH 30.5 26.0 - 34.0 pg   MCHC 33.8 30.0 - 36.0 g/dL   RDW 14.7 11.5 - 15.5 %   Platelets 352 150 - 400 K/uL     RADIOLOGY RESULTS: No results found.  Problem List: Patient Active Problem List   Diagnosis Date Noted  . Tachycardia 06/05/2011  . SOB (shortness of breath) 06/05/2011  . Pulmonary HTN ([place]) 06/05/2011  . Obesity 06/05/2011  . Smoking 06/05/2011    Assessment & Plan: Morbid obesity with BMI 50 for sleeve gastrectomy    Matt B. Hassell Done, MD, [facility] [phone] beeper [phone]  08/10/2015 3:36 PM

## 2015-08-17 ENCOUNTER — Encounter (HOSPITAL_COMMUNITY)
Admission: RE | Disposition: A | Discharge status: Home / Self Care | Payer: Self-pay | Source: Ambulatory Visit | Attending: Surgery

## 2015-08-17 ENCOUNTER — Inpatient Hospital Stay (HOSPITAL_COMMUNITY)
Admission: RE | Admit: 2015-08-17 | Discharge: 2015-08-19 | DRG: 621 | Disposition: A | Discharge status: Home / Self Care | Payer: 59 | Source: Ambulatory Visit | Attending: Surgery | Admitting: Surgery

## 2015-08-17 ENCOUNTER — Inpatient Hospital Stay (HOSPITAL_COMMUNITY): Payer: 59 | Admitting: Registered Nurse

## 2015-08-17 ENCOUNTER — Encounter (HOSPITAL_COMMUNITY): Payer: Self-pay | Admitting: *Deleted

## 2015-08-17 DIAGNOSIS — Z87891 Personal history of nicotine dependence: Secondary | ICD-10-CM

## 2015-08-17 DIAGNOSIS — Z6841 Body Mass Index (BMI) 40.0 and over, adult: Secondary | ICD-10-CM | POA: Diagnosis not present

## 2015-08-17 DIAGNOSIS — Z90710 Acquired absence of both cervix and uterus: Secondary | ICD-10-CM

## 2015-08-17 DIAGNOSIS — K429 Umbilical hernia without obstruction or gangrene: Secondary | ICD-10-CM | POA: Diagnosis present

## 2015-08-17 DIAGNOSIS — Z8249 Family history of ischemic heart disease and other diseases of the circulatory system: Secondary | ICD-10-CM

## 2015-08-17 DIAGNOSIS — K449 Diaphragmatic hernia without obstruction or gangrene: Secondary | ICD-10-CM | POA: Diagnosis present

## 2015-08-17 DIAGNOSIS — Z01812 Encounter for preprocedural laboratory examination: Secondary | ICD-10-CM

## 2015-08-17 DIAGNOSIS — I272 Other secondary pulmonary hypertension: Secondary | ICD-10-CM | POA: Diagnosis present

## 2015-08-17 DIAGNOSIS — Z9884 Bariatric surgery status: Secondary | ICD-10-CM

## 2015-08-17 HISTORY — PX: LAPAROSCOPIC GASTRIC SLEEVE RESECTION WITH HIATAL HERNIA REPAIR: SHX6512

## 2015-08-17 HISTORY — PX: UPPER GI ENDOSCOPY: SHX6162

## 2015-08-17 LAB — HEMOGLOBIN AND HEMATOCRIT, BLOOD
HCT: 44.3 % (ref 36.0–46.0)
Hemoglobin: 14.9 g/dL (ref 12.0–15.0)

## 2015-08-17 LAB — HEPATIC FUNCTION PANEL
ALT: 29 U/L (ref 14–54)
AST: 25 U/L (ref 15–41)
Albumin: 4.2 g/dL (ref 3.5–5.0)
Alkaline Phosphatase: 62 U/L (ref 38–126)
Bilirubin, Direct: 0.1 mg/dL (ref 0.1–0.5)
Indirect Bilirubin: 0.6 mg/dL (ref 0.3–0.9)
Total Bilirubin: 0.7 mg/dL (ref 0.3–1.2)
Total Protein: 7 g/dL (ref 6.5–8.1)

## 2015-08-17 LAB — CBC WITH DIFFERENTIAL/PLATELET
Basophils Absolute: 0.3 10*3/uL — ABNORMAL HIGH (ref 0.0–0.1)
Basophils Relative: 3 %
Eosinophils Absolute: 0.3 10*3/uL (ref 0.0–0.7)
Eosinophils Relative: 3 %
HCT: 45.3 % (ref 36.0–46.0)
Hemoglobin: 15.3 g/dL — ABNORMAL HIGH (ref 12.0–15.0)
Lymphocytes Relative: 23 %
Lymphs Abs: 2 10*3/uL (ref 0.7–4.0)
MCH: 31 pg (ref 26.0–34.0)
MCHC: 33.8 g/dL (ref 30.0–36.0)
MCV: 91.7 fL (ref 78.0–100.0)
Monocytes Absolute: 0.9 10*3/uL (ref 0.1–1.0)
Monocytes Relative: 10 %
Neutro Abs: 5.2 10*3/uL (ref 1.7–7.7)
Neutrophils Relative %: 61 %
Platelets: 342 10*3/uL (ref 150–400)
RBC: 4.94 MIL/uL (ref 3.87–5.11)
RDW: 14.9 % (ref 11.5–15.5)
WBC: 8.7 10*3/uL (ref 4.0–10.5)

## 2015-08-17 LAB — CREATININE, SERUM
Creatinine, Ser: 0.85 mg/dL (ref 0.44–1.00)
GFR calc Af Amer: >60 mL/min (ref >60)
GFR calc non Af Amer: >60 mL/min (ref >60)

## 2015-08-17 SURGERY — GASTRECTOMY, SLEEVE, LAPAROSCOPIC, WITH HIATAL HERNIA REPAIR
Anesthesia: General | Site: Esophagus

## 2015-08-17 MED ORDER — OXYCODONE HCL 5 MG/5ML PO SOLN
5.0000 mg | ORAL | Status: DC | PRN
  Administered 2015-08-18 (×2): 10 mg via ORAL
  Administered 2015-08-18: 5 mg via ORAL
  Administered 2015-08-19 (×3): 10 mg via ORAL
  Filled 2015-08-17 (×5): qty 10
  Filled 2015-08-17: qty 5

## 2015-08-17 MED ORDER — DEXAMETHASONE SODIUM PHOSPHATE 10 MG/ML IJ SOLN
INTRAMUSCULAR | Status: AC
  Filled 2015-08-17: qty 1

## 2015-08-17 MED ORDER — FENTANYL CITRATE (PF) 100 MCG/2ML IJ SOLN
12.5000 ug | INTRAMUSCULAR | Status: DC | PRN
  Administered 2015-08-17 – 2015-08-18 (×7): 12.5 ug via INTRAVENOUS
  Filled 2015-08-17 (×7): qty 2

## 2015-08-17 MED ORDER — ACETAMINOPHEN 160 MG/5ML PO SOLN: 325.0000 mg | ORAL | Status: DC | PRN

## 2015-08-17 MED ORDER — HYDROMORPHONE HCL 1 MG/ML IJ SOLN
INTRAMUSCULAR | Status: DC | PRN
  Administered 2015-08-17: 1 mg via INTRAVENOUS

## 2015-08-17 MED ORDER — KCL IN DEXTROSE-NACL 20-5-0.45 MEQ/L-%-% IV SOLN
INTRAVENOUS | Status: AC
  Filled 2015-08-17: qty 1000

## 2015-08-17 MED ORDER — MIDAZOLAM HCL 5 MG/5ML IJ SOLN
INTRAMUSCULAR | Status: DC | PRN
  Administered 2015-08-17: 2 mg via INTRAVENOUS

## 2015-08-17 MED ORDER — HEPARIN SODIUM (PORCINE) 5000 UNIT/ML IJ SOLN
5000.0000 [IU] | Freq: Three times a day (TID) | INTRAMUSCULAR | Status: DC
  Administered 2015-08-17 – 2015-08-19 (×4): 5000 [IU] via SUBCUTANEOUS
  Filled 2015-08-17 (×5): qty 1

## 2015-08-17 MED ORDER — ROCURONIUM BROMIDE 100 MG/10ML IV SOLN
INTRAVENOUS | Status: DC | PRN
  Administered 2015-08-17: 10 mg via INTRAVENOUS
  Administered 2015-08-17: 40 mg via INTRAVENOUS
  Administered 2015-08-17: 10 mg via INTRAVENOUS

## 2015-08-17 MED ORDER — HYDROMORPHONE HCL 1 MG/ML IJ SOLN
INTRAMUSCULAR | Status: AC
Start: 2015-08-17 — End: 2015-08-18
  Filled 2015-08-17: qty 1

## 2015-08-17 MED ORDER — SUGAMMADEX SODIUM 500 MG/5ML IV SOLN
INTRAVENOUS | Status: DC | PRN
  Administered 2015-08-17: 280 mg via INTRAVENOUS

## 2015-08-17 MED ORDER — LACTATED RINGERS IV SOLN
INTRAVENOUS | Status: DC
  Administered 2015-08-17: 1000 mL via INTRAVENOUS
  Administered 2015-08-17: 11:00:00 via INTRAVENOUS

## 2015-08-17 MED ORDER — BUPIVACAINE LIPOSOME 1.3 % IJ SUSP
20.0000 mL | Freq: Once | INTRAMUSCULAR | Status: AC
  Administered 2015-08-17: 20 mL
  Filled 2015-08-17: qty 20

## 2015-08-17 MED ORDER — CHLORHEXIDINE GLUCONATE CLOTH 2 % EX PADS: 6.0000 | MEDICATED_PAD | Freq: Once | CUTANEOUS | Status: DC

## 2015-08-17 MED ORDER — ONDANSETRON HCL 4 MG/2ML IJ SOLN
INTRAMUSCULAR | Status: AC
  Filled 2015-08-17: qty 2

## 2015-08-17 MED ORDER — DEXTROSE 5 % IV SOLN
INTRAVENOUS | Status: AC
  Filled 2015-08-17: qty 2

## 2015-08-17 MED ORDER — SUCCINYLCHOLINE CHLORIDE 20 MG/ML IJ SOLN
INTRAMUSCULAR | Status: DC | PRN
  Administered 2015-08-17: 100 mg via INTRAVENOUS

## 2015-08-17 MED ORDER — PROPOFOL 10 MG/ML IV BOLUS
INTRAVENOUS | Status: DC | PRN
  Administered 2015-08-17: 250 mg via INTRAVENOUS

## 2015-08-17 MED ORDER — DEXAMETHASONE SODIUM PHOSPHATE 10 MG/ML IJ SOLN
INTRAMUSCULAR | Status: DC | PRN
  Administered 2015-08-17: 10 mg via INTRAVENOUS

## 2015-08-17 MED ORDER — SUGAMMADEX SODIUM 500 MG/5ML IV SOLN
INTRAVENOUS | Status: AC
  Filled 2015-08-17: qty 5

## 2015-08-17 MED ORDER — IPRATROPIUM-ALBUTEROL 20-100 MCG/ACT IN AERS
INHALATION_SPRAY | RESPIRATORY_TRACT | Status: DC | PRN
  Administered 2015-08-17: 4 via RESPIRATORY_TRACT
  Administered 2015-08-17: 3 via RESPIRATORY_TRACT

## 2015-08-17 MED ORDER — 0.9 % SODIUM CHLORIDE (POUR BTL) OPTIME
TOPICAL | Status: DC | PRN
  Administered 2015-08-17: 1000 mL

## 2015-08-17 MED ORDER — ALBUTEROL SULFATE HFA 108 (90 BASE) MCG/ACT IN AERS
INHALATION_SPRAY | RESPIRATORY_TRACT | Status: AC
  Filled 2015-08-17: qty 6.7

## 2015-08-17 MED ORDER — FENTANYL CITRATE (PF) 100 MCG/2ML IJ SOLN
12.5000 ug | Freq: Once | INTRAMUSCULAR | Status: AC
  Administered 2015-08-17: 12.5 ug via INTRAVENOUS
  Filled 2015-08-17: qty 2

## 2015-08-17 MED ORDER — ONDANSETRON HCL 4 MG/2ML IJ SOLN
4.0000 mg | INTRAMUSCULAR | Status: DC | PRN
  Administered 2015-08-17 – 2015-08-19 (×9): 4 mg via INTRAVENOUS
  Filled 2015-08-17 (×8): qty 2

## 2015-08-17 MED ORDER — SODIUM CHLORIDE 0.9 % IJ SOLN
INTRAMUSCULAR | Status: DC | PRN
  Administered 2015-08-17: 20 mL

## 2015-08-17 MED ORDER — SODIUM CHLORIDE 0.9 % IJ SOLN
INTRAMUSCULAR | Status: AC
  Filled 2015-08-17: qty 20

## 2015-08-17 MED ORDER — LIDOCAINE HCL (CARDIAC) 20 MG/ML IV SOLN
INTRAVENOUS | Status: DC | PRN
  Administered 2015-08-17: 100 mg via INTRAVENOUS

## 2015-08-17 MED ORDER — LABETALOL HCL 5 MG/ML IV SOLN
INTRAVENOUS | Status: DC | PRN
  Administered 2015-08-17: 2.5 mg via INTRAVENOUS

## 2015-08-17 MED ORDER — DEXTROSE 5 % IV SOLN
2.0000 g | INTRAVENOUS | Status: AC
  Administered 2015-08-17: 2 g via INTRAVENOUS

## 2015-08-17 MED ORDER — LIDOCAINE HCL (CARDIAC) 20 MG/ML IV SOLN
INTRAVENOUS | Status: AC
  Filled 2015-08-17: qty 5

## 2015-08-17 MED ORDER — FENTANYL CITRATE (PF) 250 MCG/5ML IJ SOLN
INTRAMUSCULAR | Status: AC
  Filled 2015-08-17: qty 5

## 2015-08-17 MED ORDER — HYDROMORPHONE HCL 1 MG/ML IJ SOLN
0.2500 mg | INTRAMUSCULAR | Status: DC | PRN
  Administered 2015-08-17 (×2): 0.5 mg via INTRAVENOUS

## 2015-08-17 MED ORDER — FENTANYL CITRATE (PF) 100 MCG/2ML IJ SOLN
INTRAMUSCULAR | Status: DC | PRN
  Administered 2015-08-17 (×5): 50 ug via INTRAVENOUS

## 2015-08-17 MED ORDER — PREMIER PROTEIN SHAKE
2.0000 [oz_av] | Freq: Four times a day (QID) | ORAL | Status: DC
  Administered 2015-08-19: 2 [oz_av] via ORAL

## 2015-08-17 MED ORDER — LACTATED RINGERS IR SOLN
Status: DC | PRN
  Administered 2015-08-17: 1000 mL

## 2015-08-17 MED ORDER — PROPOFOL 10 MG/ML IV BOLUS
INTRAVENOUS | Status: AC
  Filled 2015-08-17: qty 40

## 2015-08-17 MED ORDER — LABETALOL HCL 5 MG/ML IV SOLN
INTRAVENOUS | Status: AC
  Filled 2015-08-17: qty 4

## 2015-08-17 MED ORDER — MIDAZOLAM HCL 2 MG/2ML IJ SOLN
INTRAMUSCULAR | Status: AC
  Filled 2015-08-17: qty 2

## 2015-08-17 MED ORDER — PROMETHAZINE HCL 25 MG/ML IJ SOLN
INTRAMUSCULAR | Status: AC
  Filled 2015-08-17: qty 1

## 2015-08-17 MED ORDER — PANTOPRAZOLE SODIUM 40 MG IV SOLR
40.0000 mg | Freq: Every day | INTRAVENOUS | Status: DC
  Administered 2015-08-17 – 2015-08-18 (×2): 40 mg via INTRAVENOUS
  Filled 2015-08-17 (×3): qty 40

## 2015-08-17 MED ORDER — KCL IN DEXTROSE-NACL 20-5-0.45 MEQ/L-%-% IV SOLN
INTRAVENOUS | Status: DC
  Administered 2015-08-17: 100 mL/h via INTRAVENOUS
  Administered 2015-08-17 – 2015-08-19 (×3): via INTRAVENOUS
  Filled 2015-08-17 (×6): qty 1000

## 2015-08-17 MED ORDER — ONDANSETRON HCL 4 MG/2ML IJ SOLN
INTRAMUSCULAR | Status: DC | PRN
  Administered 2015-08-17: 4 mg via INTRAVENOUS

## 2015-08-17 MED ORDER — ROCURONIUM BROMIDE 100 MG/10ML IV SOLN
INTRAVENOUS | Status: AC
  Filled 2015-08-17: qty 1

## 2015-08-17 MED ORDER — HYDROMORPHONE HCL 2 MG/ML IJ SOLN
INTRAMUSCULAR | Status: AC
  Filled 2015-08-17: qty 1

## 2015-08-17 MED ORDER — ACETAMINOPHEN 160 MG/5ML PO SOLN: 650.0000 mg | ORAL | Status: DC | PRN

## 2015-08-17 MED ORDER — HEPARIN SODIUM (PORCINE) 5000 UNIT/ML IJ SOLN
5000.0000 [IU] | INTRAMUSCULAR | Status: AC
  Administered 2015-08-17: 5000 [IU] via SUBCUTANEOUS
  Filled 2015-08-17: qty 1

## 2015-08-17 MED ORDER — PROMETHAZINE HCL 25 MG/ML IJ SOLN
6.2500 mg | INTRAMUSCULAR | Status: DC | PRN
  Administered 2015-08-17: 6.25 mg via INTRAVENOUS

## 2015-08-17 SURGICAL SUPPLY — 61 items
APPLICATOR COTTON TIP 6IN STRL (MISCELLANEOUS) ×8 IMPLANT
APPLIER CLIP 5 13 M/L LIGAMAX5 (MISCELLANEOUS)
APPLIER CLIP ROT 10 11.4 M/L (STAPLE)
APPLIER CLIP ROT 13.4 12 LRG (CLIP)
BLADE SURG 15 STRL LF DISP TIS (BLADE) ×3 IMPLANT
BLADE SURG 15 STRL SS (BLADE) ×1
CABLE HIGH FREQUENCY MONO STRZ (ELECTRODE) ×4 IMPLANT
CLIP APPLIE 5 13 M/L LIGAMAX5 (MISCELLANEOUS) IMPLANT
CLIP APPLIE ROT 10 11.4 M/L (STAPLE) IMPLANT
CLIP APPLIE ROT 13.4 12 LRG (CLIP) IMPLANT
COVER SURGICAL LIGHT HANDLE (MISCELLANEOUS) ×4 IMPLANT
DEVICE SUT QUICK LOAD TK 5 (STAPLE) IMPLANT
DEVICE SUT TI-KNOT TK 5X26 (MISCELLANEOUS) ×4 IMPLANT
DEVICE SUTURE ENDOST 10MM (ENDOMECHANICALS) ×4 IMPLANT
DEVICE TROCAR PUNCTURE CLOSURE (ENDOMECHANICALS) ×4 IMPLANT
DISSECTOR BLUNT TIP ENDO 5MM (MISCELLANEOUS) ×4 IMPLANT
DRAPE CAMERA CLOSED 9X96 (DRAPES) ×4 IMPLANT
ELECT REM PT RETURN 9FT ADLT (ELECTROSURGICAL) ×4
ELECTRODE REM PT RTRN 9FT ADLT (ELECTROSURGICAL) ×3 IMPLANT
GAUZE SPONGE 4X4 12PLY STRL (GAUZE/BANDAGES/DRESSINGS) IMPLANT
GLOVE BIOGEL M 8.0 STRL (GLOVE) ×4 IMPLANT
GOWN STRL REUS W/TWL XL LVL3 (GOWN DISPOSABLE) ×16 IMPLANT
HANDLE STAPLE EGIA 4 XL (STAPLE) ×4 IMPLANT
HOVERMATT SINGLE USE (MISCELLANEOUS) ×4 IMPLANT
KIT BASIN OR (CUSTOM PROCEDURE TRAY) ×4 IMPLANT
LIQUID BAND (GAUZE/BANDAGES/DRESSINGS) ×4 IMPLANT
NEEDLE SPNL 22GX3.5 QUINCKE BK (NEEDLE) ×4 IMPLANT
PACK UNIVERSAL I (CUSTOM PROCEDURE TRAY) ×4 IMPLANT
PEN SKIN MARKING BROAD (MISCELLANEOUS) ×4 IMPLANT
RELOAD TRI 45 ART MED THCK BLK (STAPLE) ×4 IMPLANT
RELOAD TRI 45 ART MED THCK PUR (STAPLE) ×4 IMPLANT
RELOAD TRI 60 ART MED THCK BLK (STAPLE) ×4 IMPLANT
RELOAD TRI 60 ART MED THCK PUR (STAPLE) ×16 IMPLANT
SCISSORS LAP 5X45 EPIX DISP (ENDOMECHANICALS) IMPLANT
SCRUB PCMX 4 OZ (MISCELLANEOUS) ×8 IMPLANT
SEALANT SURGICAL APPL DUAL CAN (MISCELLANEOUS) IMPLANT
SET IRRIG TUBING LAPAROSCOPIC (IRRIGATION / IRRIGATOR) ×4 IMPLANT
SHEARS HARMONIC ACE PLUS 45CM (MISCELLANEOUS) ×4 IMPLANT
SLEEVE ADV FIXATION 5X100MM (TROCAR) ×8 IMPLANT
SLEEVE GASTRECTOMY 36FR VISIGI (MISCELLANEOUS) ×4 IMPLANT
SOLUTION ANTI FOG 6CC (MISCELLANEOUS) ×4 IMPLANT
SPONGE LAP 18X18 X RAY DECT (DISPOSABLE) ×4 IMPLANT
STAPLER VISISTAT 35W (STAPLE) ×4 IMPLANT
SUT SURGIDAC NAB ES-9 0 48 120 (SUTURE) IMPLANT
SUT VIC AB 4-0 SH 18 (SUTURE) ×4 IMPLANT
SUT VICRYL 0 TIES 12 18 (SUTURE) ×4 IMPLANT
SYR 10ML ECCENTRIC (SYRINGE) ×4 IMPLANT
SYR 20CC LL (SYRINGE) ×4 IMPLANT
SYR 50ML LL SCALE MARK (SYRINGE) ×4 IMPLANT
TOWEL OR 17X26 10 PK STRL BLUE (TOWEL DISPOSABLE) ×8 IMPLANT
TOWEL OR NON WOVEN STRL DISP B (DISPOSABLE) ×4 IMPLANT
TRAY FOLEY W/METER SILVER 14FR (SET/KITS/TRAYS/PACK) IMPLANT
TRAY FOLEY W/METER SILVER 16FR (SET/KITS/TRAYS/PACK) IMPLANT
TROCAR ADV FIXATION 12X100MM (TROCAR) ×4 IMPLANT
TROCAR ADV FIXATION 5X100MM (TROCAR) ×4 IMPLANT
TROCAR BLADELESS 15MM (ENDOMECHANICALS) ×4 IMPLANT
TROCAR BLADELESS OPT 5 100 (ENDOMECHANICALS) ×4 IMPLANT
TUBE CALIBRATION LAPBAND (TUBING) IMPLANT
TUBING CONNECTING 10 (TUBING) ×4 IMPLANT
TUBING ENDO SMARTCAP (MISCELLANEOUS) ×4 IMPLANT
TUBING FILTER THERMOFLATOR (ELECTROSURGICAL) ×4 IMPLANT

## 2015-08-17 NOTE — Anesthesia Postprocedure Evaluation (Signed)
Anesthesia Post Note  Patient: Hannah Zimmerman  Procedure(s) Performed: Procedure(s): LAPAROSCOPIC GASTRIC SLEEVE RESECTION WITH HIATAL HERNIA REPAIR UPPER GI ENDOSCOPY  Patient location during evaluation: PACU Anesthesia Type: General Level of consciousness: awake and alert Pain management: pain level controlled Vital Signs Assessment: post-procedure vital signs reviewed and stable Respiratory status: spontaneous breathing, nonlabored ventilation, respiratory function stable and patient connected to nasal cannula oxygen Cardiovascular status: blood pressure returned to baseline and stable Postop Assessment: no signs of nausea or vomiting Anesthetic complications: no    Last Vitals:  Filed Vitals:   08/17/15 1600 08/17/15 1700  BP: 146/74 159/78  Pulse: 94 96  Temp: 36.8 C 36.7 C  Resp: 18 18    Last Pain:  Filed Vitals:   08/17/15 2013  PainSc: 6                  Karisma Meiser J

## 2015-08-17 NOTE — Anesthesia Preprocedure Evaluation (Addendum)
Anesthesia Evaluation  Patient identified by MRN, date of birth, ID band Patient awake  General Assessment Comment:Past Medical History Diagnosis Date . Tachycardia  . LVH (left ventricular hypertrophy)    with pulmonary hypertension . Complication of anesthesia 20 years ago   spinal with first c section went too high stopped breathing, low bp with 2nd c section, no further issues with anesthesia . Rash    both arms from old bed bug bites, healing       Reviewed: Allergy & Precautions, NPO status , Patient's Chart, lab work & pertinent test results  History of Anesthesia Complications (+) history of anesthetic complications  Airway Mallampati: II  TM Distance: >3 FB Neck ROM: Full    Dental no notable dental hx.    Pulmonary shortness of breath, former smoker,    Pulmonary exam normal breath sounds clear to auscultation       Cardiovascular Normal cardiovascular exam Rhythm:Regular Rate:Normal  Pulmonary HTN.  ECG reviewed. Prolonged QT. Lateral STand TWA  Cardiology office note with Dr. Rockey Situ 08-05-13 reviewed.   Neuro/Psych negative neurological ROS  negative psych ROS   GI/Hepatic negative GI ROS, Neg liver ROS,   Endo/Other  Morbid obesity  Renal/GU negative Renal ROS  negative genitourinary   Musculoskeletal negative musculoskeletal ROS (+)   Abdominal (+) + obese,   Peds negative pediatric ROS (+)  Hematology negative hematology ROS (+)   Anesthesia Other Findings   Reproductive/Obstetrics negative OB ROS                         Anesthesia Physical Anesthesia Plan  ASA: IV  Anesthesia Plan: General   Post-op Pain Management:    Induction: Intravenous  Airway Management Planned: Oral ETT  Additional Equipment:   Intra-op Plan:   Post-operative Plan: Extubation in OR  Informed Consent: I have reviewed the patients History and Physical,  chart, labs and discussed the procedure including the risks, benefits and alternatives for the proposed anesthesia with the patient or authorized representative who has indicated his/her understanding and acceptance.   Dental advisory given  Plan Discussed with: CRNA  Anesthesia Plan Comments:        Anesthesia Quick Evaluation

## 2015-08-17 NOTE — Op Note (Signed)
Surgeon: Kaylyn Lim, MD, FACS  Asst:  Greer Pickerel, MD, FACS  Anes:  General endotracheal  Procedure: Laparoscopic sleeve gastrectomy; posterior hiatal hernia repair with 2 sutures;  and upper endoscopy-Dr. Redmond Pulling  Diagnosis: Morbid obesity BMI 50 with pulmonary hypertension  Complications: none  EBL:   minimal cc  Description of Procedure:  The patient was take to OR 4 and given general anesthesia.  The abdomen was prepped with PCMX and draped sterilely.  A timeout was performed.  Access to the abdomen was achieved with 5 mm Optiview without difficulty.  Following insufflation, the state of the abdomen was found to be with a chronically incarcerated omental hernia up in an umbilical hernia.  A visible crural defect was seen anteriorly.  A balloon test was positive with 10 cc of air.  Two sutures were placed posteriorly and then the balloon test was negative.   The ViSiGi 36Fr tube was inserted to deflate the stomach and was pulled back into the esophagus.    The pylorus was identified and we measured 5 cm back and marked the antrum.  At that point we began dissection to take down the greater curvature of the stomach using the Harmonic scalpel.  This dissection was taken all the way up to the left crus.  Posterior attachments of the stomach were also taken down and these were slightly more than usual.    The ViSiGi tube was then passed into the antrum and suction applied so that it was snug along the lessor curvature.  The "crow's foot" or incisura was identified.  The sleeve gastrectomy was begun using the [employer] stapler beginning with a 4.5 cm black load with TRS. Another black load 6 cm was fired followed by several 6 cm purple loads with TRS.    When the sleeve was complete the tube was taken off suction and insufflated briefly.  The tube was withdrawn.  Upper endoscopy was then performed by Dr. Redmond Pulling.     The specimen was extracted through the 15 trocar site and this was  repaired with the Endoclose and a 0 vicryl suture.  Wounds were infiltrated with Exparel and closed with 4-0 vicryl.    Matt B. Hassell Done, [place], [facility], [place]

## 2015-08-17 NOTE — H&P (View-Only) (Signed)
Chief Complaint:  Morbid obesity BMI 50  History of Present Illness:  Hannah Zimmerman is an 44 y.o. female who was seen by me initially back in February when she presented for bariatric evaluation.  She has been studying surgery for about 5 years.  She wants to proceed with sleeve gastrectomy and is admitted at this time for that operation .  She denies GERD and has had 3 C sections and hysterectomy  Past Medical History  Diagnosis Date  . Tachycardia   . LVH (left ventricular hypertrophy)     with pulmonary hypertension  . Complication of anesthesia 20 years ago    spinal with first c section went too high stopped breathing, low bp with 2nd c section, no further issues with anesthesia  . Rash     both arms from old bed bug bites, healing    Past Surgical History  Procedure Laterality Date  . Abdominal hysterectomy    . Tubal ligation      x 2  . Cesarean section      x 3  . Ankle surgery      left x 2    Current Outpatient Prescriptions  Medication Sig Dispense Refill  . calcium citrate-vitamin D (CITRACAL+D) 315-200 MG-UNIT tablet Take 1 tablet by mouth 2 (two) times daily.    Marland Kitchen ibuprofen (ADVIL,MOTRIN) 600 MG tablet Take 600 mg by mouth every 8 (eight) hours as needed (pain).     . Multiple Vitamin (MULTIVITAMIN WITH MINERALS) TABS tablet Take 1 tablet by mouth 2 (two) times daily.     No current facility-administered medications for this visit.   Codeine and Hydrocodone Family History  Problem Relation Age of Onset  . Hypertension Mother    Social History:   reports that she quit smoking about 3 months ago. Her smoking use included Cigarettes. She has a 20 pack-year smoking history. She has never used smokeless tobacco. She reports that she drinks about 0.5 oz of alcohol per week. She reports that she does not use illicit drugs.   REVIEW OF SYSTEMS : Negative except for see problem list  Physical Exam:   There were no vitals taken for this visit. There is no weight  on file to calculate BMI.--BMI 50   Gen:  WDWN WF NAD  Neurological: Alert and oriented to person, place, and time. Motor and sensory function is grossly intact  Head: Normocephalic and atraumatic.  Eyes: Conjunctivae are normal. Pupils are equal, round, and reactive to light. No scleral icterus.  Neck: Normal range of motion. Neck supple. No tracheal deviation or thyromegaly present.  Cardiovascular:  SR without murmurs or gallops.  No carotid bruits Breast:  Not examined Respiratory: Effort normal.  No respiratory distress. No chest wall tenderness. Breath sounds normal.  No wheezes, rales or rhonchi.  Abdomen:  nontender  GU:  Not examinted Musculoskeletal: Normal range of motion. Extremities are nontender. No cyanosis, edema or clubbing noted Lymphadenopathy: No cervical, preauricular, postauricular or axillary adenopathy is present Skin: Skin is warm and dry. No rash noted. No diaphoresis. No erythema. No pallor. Pscyh: Normal mood and affect. Behavior is normal. Judgment and thought content normal.   LABORATORY RESULTS: Results for orders placed or performed during the hospital encounter of 08/09/15 (from the past 48 hour(s))  Basic metabolic panel     Status: None   Collection Time: 08/09/15 11:35 AM  Result Value Ref Range   Sodium 139 135 - 145 mmol/L   Potassium 4.6 3.5 -  5.1 mmol/L   Chloride 105 101 - 111 mmol/L   CO2 26 22 - 32 mmol/L   Glucose, Bld 94 65 - 99 mg/dL   BUN 20 6 - 20 mg/dL   Creatinine, Ser 0.64 0.44 - 1.00 mg/dL   Calcium 9.5 8.9 - 10.3 mg/dL   GFR calc non Af Amer >60 >60 mL/min   GFR calc Af Amer >60 >60 mL/min    Comment: (NOTE) The eGFR has been calculated using the CKD EPI equation. This calculation has not been validated in all clinical situations. eGFR's persistently <60 mL/min signify possible Chronic Kidney Disease.    Anion gap 8 5 - 15  CBC     Status: Abnormal   Collection Time: 08/09/15 11:35 AM  Result Value Ref Range   WBC 11.3 (H)  4.0 - 10.5 K/uL   RBC 5.14 (H) 3.87 - 5.11 MIL/uL   Hemoglobin 15.7 (H) 12.0 - 15.0 g/dL   HCT 46.5 (H) 36.0 - 46.0 %   MCV 90.5 78.0 - 100.0 fL   MCH 30.5 26.0 - 34.0 pg   MCHC 33.8 30.0 - 36.0 g/dL   RDW 14.7 11.5 - 15.5 %   Platelets 352 150 - 400 K/uL     RADIOLOGY RESULTS: No results found.  Problem List: Patient Active Problem List   Diagnosis Date Noted  . Tachycardia 06/05/2011  . SOB (shortness of breath) 06/05/2011  . Pulmonary HTN ([place]) 06/05/2011  . Obesity 06/05/2011  . Smoking 06/05/2011    Assessment & Plan: Morbid obesity with BMI 50 for sleeve gastrectomy    Matt B. Hassell Done, MD, [facility] [phone] beeper [phone]  08/10/2015 3:36 PM

## 2015-08-17 NOTE — Interval H&P Note (Signed)
History and Physical Interval Note:  08/17/2015 9:48 AM  Hannah Zimmerman  has presented today for surgery, with the diagnosis of Morbid Obesity  The various methods of treatment have been discussed with the patient and family. After consideration of risks, benefits and other options for treatment, the patient has consented to  Procedure(s): LAPAROSCOPIC GASTRIC SLEEVE RESECTION (N/A) as a surgical intervention .  The patient's history has been reviewed, patient examined, no change in status, stable for surgery.  I have reviewed the patient's chart and labs.  Questions were answered to the patient's satisfaction.     Randye Treichler B

## 2015-08-17 NOTE — Anesthesia Procedure Notes (Signed)
Procedure Name: Intubation Date/Time: 08/17/2015 10:27 AM Performed by: Carleene Cooper A Pre-anesthesia Checklist: Patient identified, Emergency Drugs available, Suction available, Patient being monitored and Timeout performed Patient Re-evaluated:Patient Re-evaluated prior to inductionOxygen Delivery Method: Circle system utilized Preoxygenation: Pre-oxygenation with 100% oxygen Intubation Type: IV induction Ventilation: Mask ventilation without difficulty Laryngoscope Size: Mac and 4 Grade View: Grade I Tube type: Oral Tube size: 7.5 mm Number of attempts: 1 Airway Equipment and Method: Stylet Placement Confirmation: ETT inserted through vocal cords under direct vision,  breath sounds checked- equal and bilateral and positive ETCO2 Secured at: 21 cm Tube secured with: Tape Dental Injury: Teeth and Oropharynx as per pre-operative assessment

## 2015-08-17 NOTE — Progress Notes (Signed)
Patient alert and oriented, Post op day 1.  Provided support and encouragement.  Encouraged pulmonary toilet and  Ambulation.  All questions answered.  Will continue to monitor.

## 2015-08-17 NOTE — Transfer of Care (Signed)
Immediate Anesthesia Transfer of Care Note  Patient: Hannah Zimmerman  Procedure(s) Performed: Procedure(s): LAPAROSCOPIC GASTRIC SLEEVE RESECTION WITH HIATAL HERNIA REPAIR UPPER GI ENDOSCOPY  Patient Location: PACU  Anesthesia Type:General  Level of Consciousness: awake, alert , oriented and patient cooperative  Airway & Oxygen Therapy: Patient Spontanous Breathing and Patient connected to face mask oxygen  Post-op Assessment: Report given to RN, Post -op Vital signs reviewed and stable and Patient moving all extremities  Post vital signs: Reviewed and stable  Last Vitals:  Filed Vitals:   08/17/15 0807  BP: 132/64  Pulse: 93  Temp: 36.7 C  Resp: 18    Complications: No apparent anesthesia complications

## 2015-08-17 NOTE — Op Note (Signed)
Hannah Zimmerman [id number] [birth year] 08/17/2015  Preoperative diagnosis: morbid obesity  Postoperative diagnosis: Same   Procedure: upper endoscopy   Surgeon: Leighton Ruff. Tucker Minter M.D., FACS   Anesthesia: Gen.   Indications for procedure: 44 y.o. year old female undergoing Laparoscopic Gastric Sleeve Resection and an EGD was requested to evaluate the new gastric sleeve.   Description of procedure: After we have completed the sleeve resection, I scrubbed out and obtained the Olympus endoscope. I gently placed endoscope in the patient's oropharynx and gently glided it down the esophagus without any difficulty under direct visualization. Once I was in the gastric sleeve, I insufflated the stomach with air. I was able to cannulate and advanced the scope through the gastric sleeve. I was able to cannulate the duodenum with ease. Dr. Hassell Done had placed saline in the upper abdomen. Upon further insufflation of the gastric sleeve there was no evidence of bubbles. GE junction located at 39 cm.  Upon further inspection of the gastric sleeve, the mucosa appeared normal. There is no evidence of any mucosal abnormality. The sleeve was widely patent at the angularis. There was no evidence of bleeding. The gastric sleeve was decompressed. The scope was withdrawn. The patient tolerated this portion of the procedure well. Please see Dr Earlie Server operative note for details regarding the laparoscopic gastric sleeve resection.   Leighton Ruff. Redmond Pulling, MD, FACS  General, Bariatric, & Minimally Invasive Surgery  [facility], [place]

## 2015-08-18 ENCOUNTER — Encounter (HOSPITAL_COMMUNITY): Payer: Self-pay | Admitting: Surgery

## 2015-08-18 LAB — HEMOGLOBIN AND HEMATOCRIT, BLOOD
HCT: 43.2 % (ref 36.0–46.0)
Hemoglobin: 14.1 g/dL (ref 12.0–15.0)

## 2015-08-18 LAB — CBC WITH DIFFERENTIAL/PLATELET
Basophils Absolute: 0 10*3/uL (ref 0.0–0.1)
Basophils Relative: 0 %
Eosinophils Absolute: 0 10*3/uL (ref 0.0–0.7)
Eosinophils Relative: 0 %
HCT: 41.5 % (ref 36.0–46.0)
Hemoglobin: 13.5 g/dL (ref 12.0–15.0)
Lymphocytes Relative: 7 %
Lymphs Abs: 1.5 10*3/uL (ref 0.7–4.0)
MCH: 29.7 pg (ref 26.0–34.0)
MCHC: 32.5 g/dL (ref 30.0–36.0)
MCV: 91.4 fL (ref 78.0–100.0)
Monocytes Absolute: 1.1 10*3/uL — ABNORMAL HIGH (ref 0.1–1.0)
Monocytes Relative: 6 %
Neutro Abs: 17.6 10*3/uL — ABNORMAL HIGH (ref 1.7–7.7)
Neutrophils Relative %: 87 %
Platelets: 374 10*3/uL (ref 150–400)
RBC: 4.54 MIL/uL (ref 3.87–5.11)
RDW: 14.6 % (ref 11.5–15.5)
WBC: 20.1 10*3/uL — ABNORMAL HIGH (ref 4.0–10.5)

## 2015-08-18 MED ORDER — ALBUTEROL SULFATE (2.5 MG/3ML) 0.083% IN NEBU
2.5000 mg | INHALATION_SOLUTION | Freq: Four times a day (QID) | RESPIRATORY_TRACT | Status: DC | PRN
  Administered 2015-08-18: 2.5 mg via RESPIRATORY_TRACT
  Filled 2015-08-18: qty 3

## 2015-08-18 NOTE — Care Management Note (Signed)
Case Management Note  Patient Details  Name: MELIDA TOLLER MRN: [id number] Date of Birth: [birth year]  Subjective/Objective:      S/p Laparoscopic sleeve gastrectomy; posterior hiatal hernia repair with 2 sutures; and upper endoscopy              Action/Plan: Discharge planning, no HH needs identified  Expected Discharge Date:                  Expected Discharge Plan:  Home/Self Care  In-House Referral:  NA  Discharge planning Services  CM Consult  Post Acute Care Choice:  NA Choice offered to:  NA  DME Arranged:  N/A DME Agency:  NA  HH Arranged:  NA HH Agency:  NA  Status of Service:  Completed, signed off  Medicare Important Message Given:    Date Medicare IM Given:    Medicare IM give by:    Date Additional Medicare IM Given:    Additional Medicare Important Message give by:     If discussed at [place] of Stay Meetings, dates discussed:    Additional Comments:  [name], RN 08/18/2015, 8:20 AM

## 2015-08-18 NOTE — Progress Notes (Signed)
Patient alert and oriented, Post op day 1.  Provided support and encouragement.  Encouraged pulmonary toilet, ambulation and small sips of liquids.  All questions answered.  Will continue to monitor. 

## 2015-08-18 NOTE — Plan of Care (Signed)
Problem: Food- and Nutrition-Related Knowledge Deficit (NB-1.1) Goal: Nutrition education Formal process to instruct or train a patient/client in a skill or to impart knowledge to help patients/clients voluntarily manage or modify food choices and eating behavior to maintain or improve health. Outcome: Completed/Met Date Met:  08/18/15 Nutrition Education Note  Received consult for diet education per DROP protocol.   Discussed 2 week post op diet with pt. Emphasized that liquids must be non carbonated, non caffeinated, and sugar free. Fluid goals discussed. Pt to follow up with outpatient bariatric RD for further diet progression after 2 weeks. Multivitamins and minerals also reviewed. Teach back method used, pt expressed understanding, expect good compliance.   Diet: First 2 Weeks  You will see the nutritionist about two (2) weeks after your surgery. The nutritionist will increase the types of foods you can eat if you are handling liquids well:  If you have severe vomiting or nausea and cannot handle clear liquids lasting longer than 1 day, call your surgeon  Protein Shake  Drink at least 2 ounces of shake 5-6 times per day  Each serving of protein shakes (usually 8 - 12 ounces) should have a minimum of:  15 grams of protein  And no more than 5 grams of carbohydrate  Goal for protein each day:  Men = 80 grams per day  Women = 60 grams per day  Protein powder may be added to fluids such as non-fat milk or Lactaid milk or Soy milk (limit to 35 grams added protein powder per serving)   Hydration  Slowly increase the amount of water and other clear liquids as tolerated (See Acceptable Fluids)  Slowly increase the amount of protein shake as tolerated  Sip fluids slowly and throughout the day  May use sugar substitutes in small amounts (no more than 6 - 8 packets per day; i.e. Splenda)   Fluid Goal  The first goal is to drink at least 8 ounces of protein shake/drink per day (or as directed  by the nutritionist); some examples of protein shakes are Syntrax Nectar, Adkins Advantage, EAS Edge HP, and Unjury. See handout from pre-op Bariatric Education Class:  Slowly increase the amount of protein shake you drink as tolerated  You may find it easier to slowly sip shakes throughout the day  It is important to get your proteins in first  Your fluid goal is to drink 64 - 100 ounces of fluid daily  It may take a few weeks to build up to this  32 oz (or more) should be clear liquids  And  32 oz (or more) should be full liquids (see below for examples)  Liquids should not contain sugar, caffeine, or carbonation   Clear Liquids:  Water or Sugar-free flavored water (i.e. Fruit H2O, Propel)  Decaffeinated coffee or tea (sugar-free)  Crystal Lite, Wyler's Lite, Minute Maid Lite  Sugar-free Jell-O  Bouillon or broth  Sugar-free Popsicle: *Less than 20 calories each; Limit 1 per day   Full Liquids:  Protein Shakes/Drinks + 2 choices per day of other full liquids  Full liquids must be:  No More Than 12 grams of Carbs per serving  No More Than 3 grams of Fat per serving  Strained low-fat cream soup  Non-Fat milk  Fat-free Lactaid Milk  Sugar-free yogurt (Dannon Lite & Fit, Greek yogurt)     Zedric Deroy, MS, RD, LDN Pager: [phone] After Hours Pager: [phone]        

## 2015-08-18 NOTE — Progress Notes (Signed)
Patient ID: Hannah Zimmerman, female   DOB: Oct 07, 1970, 44 y.o.   MRN: [id number] [facility] Progress Note:   1 Day Post-Op  Subjective: Mental status is clear Objective: Vital signs in last 24 hours: Temp:  [97.5 F (36.4 C)-98.9 F (37.2 C)] 98.4 F (36.9 C) (11/30 1000) Pulse Rate:  [78-98] 82 (11/30 1000) Resp:  [13-18] 18 (11/30 1000) BP: (130-180)/(71-108) 137/71 mmHg (11/30 1000) SpO2:  [90 %-100 %] 100 % (11/30 1000)  Intake/Output from previous day: 11/29 0701 - 11/30 0700 In: 3050 [I.V.:3000; IV Piggyback:50] Out: 625 [Urine:600; Blood:25] Intake/Output this shift: Total I/O In: 2033.3 [I.V.:2033.3] Out: 700 [Urine:700]  Physical Exam: Work of breathing is normal.  Up walking OK  Lab Results:  Results for orders placed or performed during the hospital encounter of 08/17/15 (from the past 48 hour(s))  CBC WITH DIFFERENTIAL     Status: Abnormal   Collection Time: 08/17/15  8:40 AM  Result Value Ref Range   WBC 8.7 4.0 - 10.5 K/uL   RBC 4.94 3.87 - 5.11 MIL/uL   Hemoglobin 15.3 (H) 12.0 - 15.0 g/dL   HCT 45.3 36.0 - 46.0 %   MCV 91.7 78.0 - 100.0 fL   MCH 31.0 26.0 - 34.0 pg   MCHC 33.8 30.0 - 36.0 g/dL   RDW 14.9 11.5 - 15.5 %   Platelets 342 150 - 400 K/uL   Neutrophils Relative % 61 %   Lymphocytes Relative 23 %   Monocytes Relative 10 %   Eosinophils Relative 3 %   Basophils Relative 3 %   Neutro Abs 5.2 1.7 - 7.7 K/uL   Lymphs Abs 2.0 0.7 - 4.0 K/uL   Monocytes Absolute 0.9 0.1 - 1.0 K/uL   Eosinophils Absolute 0.3 0.0 - 0.7 K/uL   Basophils Absolute 0.3 (H) 0.0 - 0.1 K/uL   Smear Review MORPHOLOGY UNREMARKABLE   Hepatic function panel     Status: None   Collection Time: 08/17/15  8:40 AM  Result Value Ref Range   Total Protein 7.0 6.5 - 8.1 g/dL   Albumin 4.2 3.5 - 5.0 g/dL   AST 25 15 - 41 U/L   ALT 29 14 - 54 U/L   Alkaline Phosphatase 62 38 - 126 U/L   Total Bilirubin 0.7 0.3 - 1.2 mg/dL   Bilirubin, Direct 0.1 0.1 - 0.5 mg/dL   Indirect Bilirubin 0.6 0.3 - 0.9 mg/dL  Creatinine, serum     Status: None   Collection Time: 08/17/15  8:40 AM  Result Value Ref Range   Creatinine, Ser 0.85 0.44 - 1.00 mg/dL   GFR calc non Af Amer >60 >60 mL/min   GFR calc Af Amer >60 >60 mL/min    Comment: (NOTE) The eGFR has been calculated using the CKD EPI equation. This calculation has not been validated in all clinical situations. eGFR's persistently <60 mL/min signify possible Chronic Kidney Disease.   Hemoglobin and hematocrit, blood     Status: None   Collection Time: 08/17/15  1:11 PM  Result Value Ref Range   Hemoglobin 14.9 12.0 - 15.0 g/dL   HCT 44.3 36.0 - 46.0 %  CBC WITH DIFFERENTIAL     Status: Abnormal   Collection Time: 08/18/15  5:55 AM  Result Value Ref Range   WBC 20.1 (H) 4.0 - 10.5 K/uL   RBC 4.54 3.87 - 5.11 MIL/uL   Hemoglobin 13.5 12.0 - 15.0 g/dL   HCT 41.5 36.0 - 46.0 %  MCV 91.4 78.0 - 100.0 fL   MCH 29.7 26.0 - 34.0 pg   MCHC 32.5 30.0 - 36.0 g/dL   RDW 14.6 11.5 - 15.5 %   Platelets 374 150 - 400 K/uL   Neutrophils Relative % 87 %   Neutro Abs 17.6 (H) 1.7 - 7.7 K/uL   Lymphocytes Relative 7 %   Lymphs Abs 1.5 0.7 - 4.0 K/uL   Monocytes Relative 6 %   Monocytes Absolute 1.1 (H) 0.1 - 1.0 K/uL   Eosinophils Relative 0 %   Eosinophils Absolute 0.0 0.0 - 0.7 K/uL   Basophils Relative 0 %   Basophils Absolute 0.0 0.0 - 0.1 K/uL    Radiology/Results: No results found.  Anti-infectives: Anti-infectives    Start     Dose/Rate Route Frequency Ordered Stop   08/17/15 0759  cefOXitin (MEFOXIN) 2 g in dextrose 5 % 50 mL IVPB     2 g 100 mL/hr over 30 Minutes Intravenous On call to O.R. 08/17/15 0759 08/17/15 1034      Assessment/Plan: Problem List: Patient Active Problem List   Diagnosis Date Noted  . S/P laparoscopic sleeve gastrectomy 08/17/2015  . Tachycardia 06/05/2011  . SOB (shortness of breath) 06/05/2011  . Pulmonary HTN ([place]) 06/05/2011  . Obesity 06/05/2011  . Smoking  06/05/2011    Begin PD 1 liquids 1 Day Post-Op    LOS: 1 day   Matt B. Hassell Done, MD, [facility] [phone] beeper [phone]  08/18/2015 12:13 PM

## 2015-08-19 LAB — CBC WITH DIFFERENTIAL/PLATELET
Basophils Absolute: 0.1 10*3/uL (ref 0.0–0.1)
Basophils Relative: 0 %
Eosinophils Absolute: 0 10*3/uL (ref 0.0–0.7)
Eosinophils Relative: 0 %
HCT: 41.8 % (ref 36.0–46.0)
Hemoglobin: 13.5 g/dL (ref 12.0–15.0)
Lymphocytes Relative: 11 %
Lymphs Abs: 1.8 10*3/uL (ref 0.7–4.0)
MCH: 30.3 pg (ref 26.0–34.0)
MCHC: 32.3 g/dL (ref 30.0–36.0)
MCV: 93.7 fL (ref 78.0–100.0)
Monocytes Absolute: 1.5 10*3/uL — ABNORMAL HIGH (ref 0.1–1.0)
Monocytes Relative: 9 %
Neutro Abs: 13.8 10*3/uL — ABNORMAL HIGH (ref 1.7–7.7)
Neutrophils Relative %: 80 %
Platelets: 360 10*3/uL (ref 150–400)
RBC: 4.46 MIL/uL (ref 3.87–5.11)
RDW: 15 % (ref 11.5–15.5)
WBC: 17.2 10*3/uL — ABNORMAL HIGH (ref 4.0–10.5)

## 2015-08-19 MED ORDER — HEPARIN SODIUM (PORCINE) 5000 UNIT/ML IJ SOLN
5000.0000 [IU] | Freq: Three times a day (TID) | INTRAMUSCULAR | Status: DC
  Administered 2015-08-19: 5000 [IU] via SUBCUTANEOUS
  Filled 2015-08-19 (×4): qty 1

## 2015-08-19 NOTE — Discharge Summary (Signed)
Physician Discharge Summary  Patient ID: Hannah Zimmerman MRN: [id number] DOB/AGE: [birth year] 44 y.o.  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  PD 1 began clears.  Protein shakes offered on PD 2.  Ready for discharge on PD 2.    Consults: none  Significant Diagnostic Studies: none    Discharge Exam: Blood pressure 134/72, pulse 108, temperature 98.6 F (37 C), temperature source Oral, resp. rate 20, height 5' 4.5" (1.638 m), weight 135.285 kg (298 lb 4 oz), SpO2 98 %. Incisions OK  Disposition:   Discharge Instructions    Ambulate hourly while awake    Complete by:  As directed      Call MD for:  difficulty breathing, headache or visual disturbances    Complete by:  As directed      Call MD for:  persistant dizziness or light-headedness    Complete by:  As directed      Call MD for:  persistant nausea and vomiting    Complete by:  As directed      Call MD for:  redness, tenderness, or signs of infection (pain, swelling, redness, odor or green/yellow discharge around incision site)    Complete by:  As directed      Call MD for:  severe uncontrolled pain    Complete by:  As directed      Call MD for:  temperature >101 F    Complete by:  As directed      Diet bariatric full liquid    Complete by:  As directed      Incentive spirometry    Complete by:  As directed   Perform hourly while awake            Medication List    TAKE these medications        calcium citrate-vitamin D 315-200 MG-UNIT tablet  Commonly known as:  CITRACAL+D  Take 1 tablet by mouth 2 (two) times daily.     ibuprofen 600 MG tablet  Commonly known as:  ADVIL,MOTRIN  Take 600 mg by mouth every 8 (eight) hours as  needed (pain).  Notes to Patient:  Avoid NSAIDs for 6-8 weeks after surgery      multivitamin with minerals Tabs tablet  Take 1 tablet by mouth 2 (two) times daily.           Follow-up Information    Follow up with Pedro Earls, MD. Go on 09/09/2015.   Specialty:  General Surgery   Why:  For Post-Op Check at 4:15   Contact information:   [address] [place] [address] [phone]       Follow up with Pedro Earls, MD. Go on 09/29/2015.   Specialty:  General Surgery   Why:  For Post-Op Check at 1:45   Contact information:   [address] [place] [address] [phone]       Signed: Pedro Earls 08/19/2015, 8:42 AM

## 2015-08-19 NOTE — Progress Notes (Signed)
Patient alert and oriented, pain is controlled. Patient is tolerating fluids,  advanced to protein shake today, patient tolerated well.  Reviewed Gastric sleeve discharge instructions with patient and patient is able to articulate understanding.  Provided information on BELT program, Support Group and [facility] outpatient pharmacy. All questions answered, will continue to monitor.  

## 2015-08-19 NOTE — Progress Notes (Signed)
Assessment unchanged. Tolerated shakes this am. Hannah Flake, RN completed bariatric teaching. Pt verbalized understanding of all discharge instructions including follow up care and when to call the doctor. Discharged via wc to front entrance to meet awaiting vehicle to carry home. Accompanied by NT and family member.

## 2015-08-19 NOTE — Discharge Instructions (Signed)

## 2015-08-25 ENCOUNTER — Telehealth (HOSPITAL_COMMUNITY): Payer: Self-pay

## 2015-08-25 NOTE — Telephone Encounter (Signed)

## 2015-08-31 ENCOUNTER — Encounter: Payer: 59 | Attending: Surgery

## 2015-08-31 VITALS — Ht 64.0 in | Wt 280.0 lb

## 2015-08-31 DIAGNOSIS — Z6841 Body Mass Index (BMI) 40.0 and over, adult: Secondary | ICD-10-CM | POA: Diagnosis not present

## 2015-08-31 DIAGNOSIS — E669 Obesity, unspecified: Secondary | ICD-10-CM | POA: Insufficient documentation

## 2015-08-31 DIAGNOSIS — Z713 Dietary counseling and surveillance: Secondary | ICD-10-CM | POA: Diagnosis not present

## 2015-09-01 NOTE — Progress Notes (Signed)
Bariatric Class:  Appt start time: 1530 end time:  1630.  2 Week Post-Operative Nutrition Class  Patient was seen on 08/31/2015 for Post-Operative Nutrition [facility].   Surgery date: 08/17/2015 Surgery type: Sleeve gastrectomy Start weight at [facility]: 316.5 lbs on 12/19/14 Weight today: 280.0 lbs  Weight change: 36 lbs  TANITA  BODY COMP RESULTS  08/02/15 09/01/15   BMI (kg/m^2) 54.2 48.1   Fat Mass (lbs) 161.5 149.5   Fat Free Mass (lbs) 154.5 130.5   Total Body Water (lbs) 113 95.5    The following the learning objectives were met by the patient during this course:  Identifies Phase 3A (Soft, High Proteins) Dietary Goals and will begin from 2 weeks post-operatively to 2 months post-operatively  Identifies appropriate sources of fluids and proteins   States protein recommendations and appropriate sources post-operatively  Identifies the need for appropriate texture modifications, mastication, and bite sizes when consuming solids  Identifies appropriate multivitamin and calcium sources post-operatively  Describes the need for physical activity post-operatively and will follow MD recommendations  States when to call healthcare provider regarding medication questions or post-operative complications  Handouts given during class include:  Phase 3A: Soft, High Protein Diet Handout  Follow-Up Plan: Patient will follow-up at [facility] in 6 weeks for 2 month post-op nutrition visit for diet advancement per MD.

## 2015-10-11 ENCOUNTER — Encounter: Payer: Self-pay | Admitting: Dietician

## 2015-10-11 ENCOUNTER — Encounter: Payer: 59 | Attending: Surgery | Admitting: Dietician

## 2015-10-11 DIAGNOSIS — Z713 Dietary counseling and surveillance: Secondary | ICD-10-CM | POA: Diagnosis not present

## 2015-10-11 DIAGNOSIS — E669 Obesity, unspecified: Secondary | ICD-10-CM | POA: Insufficient documentation

## 2015-10-11 DIAGNOSIS — Z6841 Body Mass Index (BMI) 40.0 and over, adult: Secondary | ICD-10-CM | POA: Diagnosis not present

## 2015-10-11 NOTE — Progress Notes (Signed)
  Follow-up visit:  8 Weeks Post-Operative sleeve gastrectomy Surgery  Medical Nutrition Therapy:  Appt start time: 250 end time:  330.  Primary concerns today: Post-operative Bariatric Surgery Nutrition Management. Hannah Zimmerman returns having lost a total of 63.5 lbs. She reports that she is able to eat only 1/2 oz of meat at a time. Also having difficulty meeting fluid needs. Able to tolerate cheese easily. Having difficulty tolerating most meats, especially chicken. Has tolerated about 2 Tbsp beans at a time. She reports an aversion to eggs. Feels nauseated most days and states the nausea worsens with sugar free flavoring; she has removed these from her diet. Has a bowel movement every other day and has not had any vomiting. Denies pain.   Surgery date: 08/17/2015 Surgery type: Sleeve gastrectomy Start weight at [facility]: 316.5 lbs on 12/19/14 Weight today: 253 lbs Weight change: 27 lbs Total weight lost: 63.5 lbs  TANITA  BODY COMP RESULTS  08/02/15 09/01/15 10/11/15   BMI (kg/m^2) 54.2 48.1 43.4   Fat Mass (lbs) 161.5 149.5 121.5   Fat Free Mass (lbs) 154.5 130.5 131.5   Total Body Water (lbs) 113 95.5 96.5    Preferred Learning Style:   No preference indicated   Learning Readiness:   Ready  24-hr recall: B (AM): water, Premier protein shake throughout the day (30g) Snk (AM):   L (PM):  Snk (PM):   D (PM): 1 slice cheese (4g) Snk (PM):   Fluid intake: 45-50 oz water and protein shakes Estimated total protein intake: 34g  Medications: see list Supplementation: taking  Drinking while eating: no Hair loss: none Carbonated beverages: none N/V/D/C: regurgitated 1x; had c-diff after surgery followed by constipation; taking stool softener every other day; stays nauseated Dumping syndrome: none  Recent physical activity:  walking daily  Progress Towards Goal(s):  In progress.  Handouts given during visit include:  none   Nutritional Diagnosis:  [name]-3.3 Overweight/obesity  related to past poor dietary habits and physical inactivity as evidenced by patient w/ recent sleeve gastrectomy surgery following dietary guidelines for continued weight loss.     Intervention:  Nutrition counseling provided.  Teaching Method Utilized:  Visual Auditory Hands on  Barriers to learning/adherence to lifestyle change: food aversions; unable to tolerate more than 1/2 oz food  Demonstrated degree of understanding via:  Teach Back   Monitoring/Evaluation:  Dietary intake, exercise, and body weight. Follow up in 1 months for 3 month post-op visit.

## 2015-10-11 NOTE — Patient Instructions (Addendum)
Goals:  Follow Phase 3B: High Protein + Non-Starchy Vegetables  Eat 3-6 small meals/snacks, every 3-5 hrs  Increase lean protein foods to meet 60g goal (beans, cheese, seafood, [place] sausage)  Try eating solid foods 2x a day instead of 1  Increase fluid intake to 64oz +  Keep sipping water and protein shake throughout the day  Avoid drinking 15 minutes before, during and 30 minutes after eating  Aim for >30 min of physical activity daily  Call [facility] to discuss nausea  Quest protein bars or Oh Yeah One bars  Try some low fiber veggies like green beans, zucchini, and squash (protein is the priority)  Surgery date: 08/17/2015 Surgery type: Sleeve gastrectomy Start weight at [facility]: 316.5 lbs on 12/19/14 Weight today: 253 lbs Weight change: 27 lbs Total weight lost: 63.5 lbs  TANITA  BODY COMP RESULTS  08/02/15 09/01/15 10/11/15   BMI (kg/m^2) 54.2 48.1 43.4   Fat Mass (lbs) 161.5 149.5 121.5   Fat Free Mass (lbs) 154.5 130.5 131.5   Total Body Water (lbs) 113 95.5 96.5

## 2015-11-15 ENCOUNTER — Encounter: Payer: 59 | Attending: Surgery | Admitting: Dietician

## 2015-11-15 DIAGNOSIS — Z6841 Body Mass Index (BMI) 40.0 and over, adult: Secondary | ICD-10-CM | POA: Insufficient documentation

## 2015-11-15 DIAGNOSIS — Z713 Dietary counseling and surveillance: Secondary | ICD-10-CM | POA: Insufficient documentation

## 2015-11-15 DIAGNOSIS — E669 Obesity, unspecified: Secondary | ICD-10-CM | POA: Diagnosis not present

## 2015-11-15 NOTE — Patient Instructions (Addendum)
Goals:  Follow Phase 3B: High Protein + Non-Starchy Vegetables  Eat 3-6 small meals/snacks, every 3-5 hrs  Increase lean protein foods to meet 60g goal (beans, cheese, seafood, [place] sausage)  Increase fluid intake to 64oz +  Keep sipping water and protein shake throughout the day  Avoid drinking 15 minutes before, during and 30 minutes after eating  Aim for >30 min of physical activity daily  Try some low fiber veggies like green beans, zucchini, and squash (protein is the priority)  Cut back on nuts if you see your weight loss slow  Surgery date: 08/17/2015 Surgery type: Sleeve gastrectomy Start weight at [facility]: 316.5 lbs on 12/19/14 Weight today: 241 lbs Weight change: 12 lbs Total weight lost: 75.5 lbs  TANITA  BODY COMP RESULTS  08/02/15 09/01/15 10/11/15 11/15/15   BMI (kg/m^2) 54.2 48.1 43.4 41.4   Fat Mass (lbs) 161.5 149.5 121.5 105.5   Fat Free Mass (lbs) 154.5 130.5 131.5 135.5   Total Body Water (lbs) 113 95.5 96.5 99

## 2015-11-15 NOTE — Progress Notes (Signed)
  Follow-up visit:  3 months Post-Operative sleeve gastrectomy Surgery  Medical Nutrition Therapy:  Appt start time: 525 end time:  545  Primary concerns today: Post-operative Bariatric Surgery Nutrition Management. Hannah Zimmerman returns having lost another 12 lbs. She states she is feeling much better and nausea has resolved. Able to tolerate about an ounce of meat at a time. Still short on protein intake but eating about every 2-3 hours.   Surgery date: 08/17/2015 Surgery type: Sleeve gastrectomy Start weight at [facility]: 316.5 lbs on 12/19/14 Weight today: 241 lbs Weight change: 12 lbs Total weight lost: 75.5 lbs  TANITA  BODY COMP RESULTS  08/02/15 09/01/15 10/11/15 11/15/15   BMI (kg/m^2) 54.2 48.1 43.4 41.4   Fat Mass (lbs) 161.5 149.5 121.5 105.5   Fat Free Mass (lbs) 154.5 130.5 131.5 135.5   Total Body Water (lbs) 113 95.5 96.5 99    Preferred Learning Style:   No preference indicated   Learning Readiness:   Ready  24-hr recall: B (AM): Premier protein shake throughout the day (30g) Snk (AM): nuts  (5g) L (PM): 1/2 cup homemade chicken soup or chili or clam chowder or tuna salad (7g) Snk (PM): nuts (5g) D (PM): 1 oz meat + vegetables (7g) Snk (PM): sometimes small [employer] ice cream bar  Fluid intake: 64 oz water with lemon + 11 oz protein shake Estimated total protein intake: ~50g  Medications: see list, none Supplementation: taking  Drinking while eating: no Hair loss: thinks she has noticed some shedding Carbonated beverages: none N/V/D/C: nausea resolving; constipation resolving with vitamin C Dumping syndrome: none  Recent physical activity:  walking daily at work and started going to the gym last week (elliptical 20-25 minutes and weight circuit)  Progress Towards Goal(s):  In progress.  Handouts given during visit include:  none   Nutritional Diagnosis:  [place]-3.3 Overweight/obesity related to past poor dietary habits and physical inactivity as  evidenced by patient w/ recent sleeve gastrectomy surgery following dietary guidelines for continued weight loss.     Intervention:  Nutrition counseling provided.  Teaching Method Utilized:  Visual Auditory Hands on  Barriers to learning/adherence to lifestyle change: none  Demonstrated degree of understanding via:  Teach Back   Monitoring/Evaluation:  Dietary intake, exercise, and body weight. Follow up in 2 months for 5 month post-op visit.

## 2015-11-16 ENCOUNTER — Encounter: Payer: Self-pay | Admitting: Dietician

## 2015-11-16 ENCOUNTER — Ambulatory Visit: Payer: Self-pay | Admitting: Dietician

## 2016-02-20 ENCOUNTER — Emergency Department
Admission: EM | Admit: 2016-02-20 | Discharge: 2016-02-20 | Disposition: A | Discharge status: Home / Self Care | Payer: 59 | Attending: Emergency Medicine | Admitting: Emergency Medicine

## 2016-02-20 ENCOUNTER — Emergency Department: Payer: 59

## 2016-02-20 ENCOUNTER — Encounter: Payer: Self-pay | Admitting: Emergency Medicine

## 2016-02-20 DIAGNOSIS — F17210 Nicotine dependence, cigarettes, uncomplicated: Secondary | ICD-10-CM | POA: Diagnosis not present

## 2016-02-20 DIAGNOSIS — R109 Unspecified abdominal pain: Secondary | ICD-10-CM

## 2016-02-20 DIAGNOSIS — R1031 Right lower quadrant pain: Secondary | ICD-10-CM | POA: Diagnosis not present

## 2016-02-20 LAB — URINALYSIS COMPLETE WITH MICROSCOPIC (ARMC ONLY)
Glucose, UA: NEGATIVE mg/dL
Hgb urine dipstick: NEGATIVE
Leukocytes, UA: NEGATIVE
Nitrite: NEGATIVE
Protein, ur: 30 mg/dL — AB
Specific Gravity, Urine: 1.035 — ABNORMAL HIGH (ref 1.005–1.030)
pH: 5 (ref 5.0–8.0)

## 2016-02-20 LAB — CBC
HCT: 43.8 % (ref 35.0–47.0)
Hemoglobin: 14.5 g/dL (ref 12.0–16.0)
MCH: 32.1 pg (ref 26.0–34.0)
MCHC: 33.1 g/dL (ref 32.0–36.0)
MCV: 96.9 fL (ref 80.0–100.0)
Platelets: 296 10*3/uL (ref 150–440)
RBC: 4.52 MIL/uL (ref 3.80–5.20)
RDW: 14.1 % (ref 11.5–14.5)
WBC: 13.4 10*3/uL — ABNORMAL HIGH (ref 3.6–11.0)

## 2016-02-20 LAB — COMPREHENSIVE METABOLIC PANEL
ALT: 14 U/L (ref 14–54)
AST: 16 U/L (ref 15–41)
Albumin: 4 g/dL (ref 3.5–5.0)
Alkaline Phosphatase: 58 U/L (ref 38–126)
Anion gap: 8 (ref 5–15)
BUN: 13 mg/dL (ref 6–20)
CO2: 24 mmol/L (ref 22–32)
Calcium: 9.1 mg/dL (ref 8.9–10.3)
Chloride: 106 mmol/L (ref 101–111)
Creatinine, Ser: 0.84 mg/dL (ref 0.44–1.00)
GFR calc Af Amer: >60 mL/min (ref >60)
GFR calc non Af Amer: >60 mL/min (ref >60)
Glucose, Bld: 92 mg/dL (ref 65–99)
Potassium: 3.6 mmol/L (ref 3.5–5.1)
Sodium: 138 mmol/L (ref 135–145)
Total Bilirubin: 0.6 mg/dL (ref 0.3–1.2)
Total Protein: 6.5 g/dL (ref 6.5–8.1)

## 2016-02-20 LAB — LIPASE, BLOOD: Lipase: 23 U/L (ref 11–51)

## 2016-02-20 MED ORDER — FENTANYL CITRATE (PF) 100 MCG/2ML IJ SOLN
INTRAMUSCULAR | Status: AC
  Administered 2016-02-20: 50 ug via INTRAVENOUS
  Filled 2016-02-20: qty 2

## 2016-02-20 MED ORDER — OXYCODONE-ACETAMINOPHEN 5-325 MG PO TABS: 1.0000 | ORAL_TABLET | Freq: Four times a day (QID) | ORAL | Status: DC | PRN

## 2016-02-20 MED ORDER — ONDANSETRON HCL 4 MG/2ML IJ SOLN
4.0000 mg | Freq: Once | INTRAMUSCULAR | Status: AC
  Administered 2016-02-20: 4 mg via INTRAVENOUS

## 2016-02-20 MED ORDER — OXYCODONE-ACETAMINOPHEN 5-325 MG PO TABS
1.0000 | ORAL_TABLET | Freq: Once | ORAL | Status: AC
  Administered 2016-02-20: 1 via ORAL

## 2016-02-20 MED ORDER — ONDANSETRON HCL 4 MG/2ML IJ SOLN
INTRAMUSCULAR | Status: AC
  Administered 2016-02-20: 4 mg via INTRAVENOUS
  Filled 2016-02-20: qty 2

## 2016-02-20 MED ORDER — OXYCODONE-ACETAMINOPHEN 5-325 MG PO TABS
ORAL_TABLET | ORAL | Status: AC
  Filled 2016-02-20: qty 1

## 2016-02-20 MED ORDER — SODIUM CHLORIDE 0.9 % IV BOLUS (SEPSIS)
1000.0000 mL | Freq: Once | INTRAVENOUS | Status: AC
  Administered 2016-02-20: 1000 mL via INTRAVENOUS

## 2016-02-20 MED ORDER — FENTANYL CITRATE (PF) 100 MCG/2ML IJ SOLN
50.0000 ug | Freq: Once | INTRAMUSCULAR | Status: AC
Start: 2016-02-20 — End: 2016-02-20
  Administered 2016-02-20: 50 ug via INTRAVENOUS

## 2016-02-20 NOTE — Discharge Instructions (Signed)

## 2016-02-20 NOTE — ED Provider Notes (Addendum)
[facility] Emergency Department Provider Note  ____________________________________________   I have reviewed the triage vital signs and the nursing notes.   HISTORY  Chief Complaint Abdominal Pain    HPI Hannah Zimmerman is a 45 y.o. female who presents today complaining of right-sided flank pain and right lower abdominal pain. Began suddenly about 2 hours ago. Felt nauseated and did not vomit. Nothing makes the pain better and nothing makes the pain worse. Does not feel that it is something she has ever felt before. Does have a history of ovarian cyst. Does not have a history of kidney stones. States he feels absolutely nothing like her prior ovarian cyst. Has not had no very insistent 16 years. States that she had a vaginal hysterectomy 6 years ago. She denies any other complaints. No fever or chills. Pain is sharp.      Past Medical History  Diagnosis Date  . Tachycardia   . LVH (left ventricular hypertrophy)     with pulmonary hypertension  . Complication of anesthesia 20 years ago    spinal with first c section went too high stopped breathing, low bp with 2nd c section, no further issues with anesthesia  . Rash     both arms from old bed bug bites, healing    Patient Active Problem List   Diagnosis Date Noted  . 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 Tachycardia 06/05/2011  . SOB (shortness of breath) 06/05/2011  . Pulmonary HTN ([place]) 06/05/2011  . Obesity 06/05/2011  . Smoking 06/05/2011    Past Surgical History  Procedure Laterality Date  . Abdominal hysterectomy    . Tubal ligation      x 2  . Cesarean section      x 3  . Ankle surgery      left x 2  . Laparoscopic gastric sleeve resection with hiatal hernia repair  08/17/2015    Procedure: LAPAROSCOPIC GASTRIC SLEEVE RESECTION WITH HIATAL HERNIA REPAIR;  Surgeon: Johnathan Hausen, MD;  Location: [facility] ORS;  Service: General;;  . Upper gi endoscopy  08/17/2015   Procedure: UPPER GI ENDOSCOPY;  Surgeon: Johnathan Hausen, MD;  Location: [facility];  Service: General;;    Current Outpatient Rx  Name  Route  Sig  Dispense  Refill  . calcium citrate-vitamin D (CITRACAL+D) 315-200 MG-UNIT tablet   Oral   Take 1 tablet by mouth 2 (two) times daily.         Marland Kitchen ibuprofen (ADVIL,MOTRIN) 600 MG tablet   Oral   Take 600 mg by mouth every 8 (eight) hours as needed (pain).          . Multiple Vitamin (MULTIVITAMIN WITH MINERALS) TABS tablet   Oral   Take 1 tablet by mouth 2 (two) times daily.           Allergies Codeine and Hydrocodone  Family History  Problem Relation Age of Onset  . Hypertension Mother     Social History Social History  Substance Use Topics  . Smoking status: Current Every Day Smoker -- 0.50 packs/day for 20 years    Types: Cigarettes    Last Attempt to Quit: 05/03/2015  . Smokeless tobacco: Never Used  . Alcohol Use: 0.5 oz/week    1 Standard drinks or equivalent per week     Comment: occasional    Review of Systems Constitutional: No fever/chills Eyes: No visual changes. ENT: No sore throat. No stiff neck no neck pain Cardiovascular: Denies chest pain.  Respiratory: Denies shortness of breath. Gastrointestinal:   no vomiting.  No diarrhea.  No constipation. Genitourinary: Negative for dysuria. Musculoskeletal: Negative lower extremity swelling Skin: Negative for rash. Neurological: Negative for headaches, focal weakness or numbness. 10-point ROS otherwise negative.  ____________________________________________   PHYSICAL EXAM:  VITAL SIGNS: ED Triage Vitals  Enc Vitals Group     BP 02/20/16 1917 144/78 mmHg     Pulse Rate 02/20/16 1917 85     Resp 02/20/16 1917 20     Temp 02/20/16 1917 97.8 F (36.6 C)     Temp Source 02/20/16 1917 Oral     SpO2 02/20/16 1917 96 %     Weight 02/20/16 1917 204 lb (92.534 kg)     Height 02/20/16 1917 5\' 4"  (1.626 m)     Head Cir --      Peak Flow --      Pain Score  02/20/16 1923 7     Pain Loc --      Pain Edu? --      Excl. in [place]? --     Constitutional: Alert and oriented. Well appearing and in no acute distress. Eyes: Conjunctivae are normal. PERRL. EOMI. Head: Atraumatic. Nose: No congestion/rhinnorhea. Mouth/Throat: Mucous membranes are moist.  Oropharynx non-erythematous. Neck: No stridor.   Nontender with no meningismus Cardiovascular: Normal rate, regular rhythm. Grossly normal heart sounds.  Good peripheral circulation. Respiratory: Normal respiratory effort.  No retractions. Lungs CTAB. Abdominal: Soft and tenderness palpation in the right lower quadrant going around towards the right flank. No distention. No guarding no rebound Back:  There is no focal tenderness or step off there is no midline tenderness there are no lesions noted. there is mild right CVA tenderness Musculoskeletal: No lower extremity tenderness. No joint effusions, no DVT signs strong distal pulses no edema Neurologic:  Normal speech and language. No gross focal neurologic deficits are appreciated.  Skin:  Skin is warm, dry and intact. No rash noted. Psychiatric: Mood and affect are normal. Speech and behavior are normal.  ____________________________________________   LABS (all labs ordered are listed, but only abnormal results are displayed)  Labs Reviewed  CBC - Abnormal; Notable for the following:    WBC 13.4 (*)    All other components within normal limits  URINALYSIS COMPLETEWITH MICROSCOPIC ([facility] ONLY) - Abnormal; Notable for the following:    Color, Urine AMBER (*)    APPearance CLOUDY (*)    Bilirubin Urine 1+ (*)    Ketones, ur 1+ (*)    Specific Gravity, Urine 1.035 (*)    Protein, ur 30 (*)    Bacteria, UA FEW (*)    Squamous Epithelial / LPF 6-30 (*)    All other components within normal limits  LIPASE, BLOOD  COMPREHENSIVE METABOLIC PANEL   ____________________________________________  EKG  I personally interpreted any EKGs ordered by me  or triage Sinus rhythm rate 88 bpm no acute ST elevation or acute ST depression nonspecific diffuse ST changes ____________________________________________  RADIOLOGY  I reviewed any imaging ordered by me or triage that were performed during my shift and, if possible, patient and/or family made aware of any abnormal findings. ____________________________________________   PROCEDURES  Procedure(s) performed: None  Critical Care performed: None  ____________________________________________   INITIAL IMPRESSION / ASSESSMENT AND PLAN / ED COURSE  Pertinent labs & imaging results that were available during my care of the patient were reviewed by me and considered in my medical decision making (see chart for details).  Patient  with rather sudden onset lower abdominal pain radiating to the right. She does not feel it is a cyst. She does have some minimal flank pain. This does not appear to be anything intrathoracic specifically noticed nothing to suggest that the patient as ACS PE or dissection causing her to have this reproducible lower abdominal pain. Given her history of gastric sleeve even though she does not have epigastric abdominal pain we'll obtain a CT scan and blood work and vital signs which are no evidence of any acute pathology.  ----------------------------------------- 9:45 PM on 02/20/2016 -----------------------------------------  Patient after pain medication can identify exactly where the pain is. There is a very circumscribed area partially 3 cm down into the right from the bellybutton were superficial palpation of the abdominal adipose tissue elicits discomfort. There is no obvious herniation although there is possibly a slight bulge there is not red or hot to touch. There is no induration or fluctuance. Deep palpation anywhere around here causes no discomfort. Patient does have an umbilical hernia but that is not incarcerated, does not contain intestines, and is  nontender. Patient states that she did have a cough when this started and she feels that she may have torn something in her abdomen is possible she has an abdominal wall injury from coughing. Her lungs are clear at this time. She states she has a occasional smoker's cough. There is no tenderness to palpation in the left flank or left lower pelvic suggesting that the small ovarian cyst that was noted as the cause of this discomfort patient would prefer not to have a pelvic exam. There is very isolated very specific spot of superficial tenderness to the abdominal wall with a reassuring CT.  ----------------------------------------- 10:17 PM on 02/20/2016 -----------------------------------------  Review CT scan with radiology they also see no evidence of any acute pathology in the area of the patient's primary concern. She has absolutely no right upper quadrant pain or tenderness. And deep palpation everywhere but that one spot is not evocative of any discomfort. I did offer to do a pelvic exam and the patient as she does have a left-sided ovarian cyst which could be causing some of her discomfort even though it is detached from where she feels the pain, and she declines. The cyst is small enough that I do not think likely to be a great benefit to do a pelvic exam anyway but it was available to the patient for diagnostic reasons. Patient would prefer to go home at this time which is not unreasonable. Return precautions and follow-up given and understood, she feels much better. She will follow-up with her doctor tomorrow for recheck and she does understand that she feels worse in any way she must come back. In addition, patient states that she is not allergic to Percocet she was on it after her surgery and has never had a problem with her. I did counsel her what to do should she develop allergic reaction to it because she has had allergic reactions to other narcotic-containing medications but she states that she  has had Percocet multiple times with no difficulty. Patient is very comfortable with this plan.   FINAL CLINICAL IMPRESSION(S) / ED DIAGNOSES  Final diagnoses:  Right flank pain      This chart was dictated using voice recognition software.  Despite best efforts to proofread,  errors can occur which can change meaning.     Schuyler Amor, MD 02/20/16 2027  Schuyler Amor, MD 02/20/16 2147  Gerda Diss  Burlene Arnt, MD 02/20/16 2218

## 2016-02-20 NOTE — ED Notes (Signed)
Pt. States rt. Abdominal pain that started around 5 pm today.  Pt. Denies n/v.   Pt. States having gastric bypass about 6 months ago.

## 2016-02-22 ENCOUNTER — Emergency Department (HOSPITAL_COMMUNITY): Payer: 59

## 2016-02-22 ENCOUNTER — Encounter (HOSPITAL_COMMUNITY): Payer: Self-pay | Admitting: Emergency Medicine

## 2016-02-22 ENCOUNTER — Emergency Department (HOSPITAL_COMMUNITY)
Admission: EM | Admit: 2016-02-22 | Discharge: 2016-02-22 | Disposition: A | Discharge status: Home / Self Care | Payer: 59 | Attending: Emergency Medicine | Admitting: Emergency Medicine

## 2016-02-22 DIAGNOSIS — R112 Nausea with vomiting, unspecified: Secondary | ICD-10-CM | POA: Insufficient documentation

## 2016-02-22 DIAGNOSIS — R110 Nausea: Secondary | ICD-10-CM

## 2016-02-22 DIAGNOSIS — F17210 Nicotine dependence, cigarettes, uncomplicated: Secondary | ICD-10-CM | POA: Diagnosis not present

## 2016-02-22 DIAGNOSIS — R109 Unspecified abdominal pain: Secondary | ICD-10-CM

## 2016-02-22 DIAGNOSIS — R197 Diarrhea, unspecified: Secondary | ICD-10-CM | POA: Diagnosis not present

## 2016-02-22 LAB — URINALYSIS, ROUTINE W REFLEX MICROSCOPIC
Glucose, UA: NEGATIVE mg/dL
Hgb urine dipstick: NEGATIVE
Ketones, ur: >80 mg/dL — AB
Leukocytes, UA: NEGATIVE
Nitrite: NEGATIVE
Protein, ur: NEGATIVE mg/dL
Specific Gravity, Urine: 1.03 (ref 1.005–1.030)
pH: 5.5 (ref 5.0–8.0)

## 2016-02-22 LAB — COMPREHENSIVE METABOLIC PANEL
ALT: 14 U/L (ref 14–54)
AST: 16 U/L (ref 15–41)
Albumin: 4 g/dL (ref 3.5–5.0)
Alkaline Phosphatase: 58 U/L (ref 38–126)
Anion gap: 7 (ref 5–15)
BUN: 9 mg/dL (ref 6–20)
CO2: 24 mmol/L (ref 22–32)
Calcium: 9.1 mg/dL (ref 8.9–10.3)
Chloride: 107 mmol/L (ref 101–111)
Creatinine, Ser: 0.64 mg/dL (ref 0.44–1.00)
GFR calc Af Amer: >60 mL/min (ref >60)
GFR calc non Af Amer: >60 mL/min (ref >60)
Glucose, Bld: 81 mg/dL (ref 65–99)
Potassium: 4 mmol/L (ref 3.5–5.1)
Sodium: 138 mmol/L (ref 135–145)
Total Bilirubin: 0.7 mg/dL (ref 0.3–1.2)
Total Protein: 6.5 g/dL (ref 6.5–8.1)

## 2016-02-22 LAB — CBC
HCT: 44.7 % (ref 36.0–46.0)
Hemoglobin: 15.3 g/dL — ABNORMAL HIGH (ref 12.0–15.0)
MCH: 32.3 pg (ref 26.0–34.0)
MCHC: 34.2 g/dL (ref 30.0–36.0)
MCV: 94.5 fL (ref 78.0–100.0)
Platelets: 320 10*3/uL (ref 150–400)
RBC: 4.73 MIL/uL (ref 3.87–5.11)
RDW: 13.8 % (ref 11.5–15.5)
WBC: 10.1 10*3/uL (ref 4.0–10.5)

## 2016-02-22 LAB — LIPASE, BLOOD: Lipase: 20 U/L (ref 11–51)

## 2016-02-22 LAB — URINE CULTURE

## 2016-02-22 MED ORDER — KETOROLAC TROMETHAMINE 30 MG/ML IJ SOLN
30.0000 mg | Freq: Once | INTRAMUSCULAR | Status: AC
  Administered 2016-02-22: 30 mg via INTRAVENOUS
  Filled 2016-02-22: qty 1

## 2016-02-22 MED ORDER — DICYCLOMINE HCL 20 MG PO TABS: 20.0000 mg | ORAL_TABLET | Freq: Four times a day (QID) | ORAL | Status: DC

## 2016-02-22 MED ORDER — OXYCODONE-ACETAMINOPHEN 5-325 MG PO TABS: 1.0000 | ORAL_TABLET | Freq: Three times a day (TID) | ORAL | Status: DC | PRN

## 2016-02-22 MED ORDER — ONDANSETRON HCL 4 MG/2ML IJ SOLN
4.0000 mg | Freq: Once | INTRAMUSCULAR | Status: AC
Start: 2016-02-22 — End: 2016-02-22
  Administered 2016-02-22: 4 mg via INTRAVENOUS
  Filled 2016-02-22: qty 2

## 2016-02-22 MED ORDER — SODIUM CHLORIDE 0.9 % IV BOLUS (SEPSIS)
1000.0000 mL | Freq: Once | INTRAVENOUS | Status: AC
  Administered 2016-02-22: 1000 mL via INTRAVENOUS

## 2016-02-22 MED ORDER — ONDANSETRON 4 MG PO TBDP
4.0000 mg | ORAL_TABLET | Freq: Three times a day (TID) | ORAL | Status: DC | PRN
Start: 2016-02-22 — End: 2016-03-02

## 2016-02-22 NOTE — Discharge Instructions (Signed)
Read the information below.   Your ultrasound did not show any gallstones. Your WBC was normal. Your lipase was normal.  Stick to a clear liquid diet for the next 24 hours. Then slowly re-introduce bland foods.  You are prescribed anti-nausea, anti-spasmodic, and pain medication. Take as prescribed.  Use the prescribed medication as directed.  Please discuss all new medications with your pharmacist.   Be sure to call and schedule a follow up appointment with your primary care provider within the next week.  You may return to the Emergency Department at any time for worsening condition or any new symptoms that concern you. Return to ED if your symptoms worsen, you develop a fever, you have focal abdominal pain, blood in urine or blood in stool, or if you are unable to keep fluids down.    Abdominal Pain, Adult Many things can cause belly (abdominal) pain. Most times, the belly pain is not dangerous. Many cases of belly pain can be watched and treated at home. HOME CARE   Do not take medicines that help you go poop (laxatives) unless told to by your doctor.  Only take medicine as told by your doctor.  Eat or drink as told by your doctor. Your doctor will tell you if you should be on a special diet. GET HELP IF:  You do not know what is causing your belly pain.  You have belly pain while you are sick to your stomach (nauseous) or have runny poop (diarrhea).  You have pain while you pee or poop.  Your belly pain wakes you up at night.  You have belly pain that gets worse or better when you eat.  You have belly pain that gets worse when you eat fatty foods.  You have a fever. GET HELP RIGHT AWAY IF:   The pain does not go away within 2 hours.  You keep throwing up (vomiting).  The pain changes and is only in the right or left part of the belly.  You have bloody or tarry looking poop. MAKE SURE YOU:   Understand these instructions.  Will watch your condition.  Will get help  right away if you are not doing well or get worse.   This information is not intended to replace advice given to you by your health care provider. Make sure you discuss any questions you have with your health care provider.   Document Released: 02/21/2008 Document Revised: 09/25/2014 Document Reviewed: 05/14/2013 Elsevier Interactive Patient Education 2016 [employer]   Nausea and Vomiting Nausea means you feel sick to your stomach. Throwing up (vomiting) is a reflex where stomach contents come out of your mouth. HOME CARE   Take medicine as told by your doctor.  Do not force yourself to eat. However, you do need to drink fluids.  If you feel like eating, eat a normal diet as told by your doctor.  Eat rice, wheat, potatoes, bread, lean meats, yogurt, fruits, and vegetables.  Avoid high-fat foods.  Drink enough fluids to keep your pee (urine) clear or pale yellow.  Ask your doctor how to replace body fluid losses (rehydrate). Signs of body fluid loss (dehydration) include:  Feeling very thirsty.  Dry lips and mouth.  Feeling dizzy.  Dark pee.  Peeing less than normal.  Feeling confused.  Fast breathing or heart rate. GET HELP RIGHT AWAY IF:   You have blood in your throw up.  You have black or bloody poop (stool).  You have a bad headache  or stiff neck.  You feel confused.  You have bad belly (abdominal) pain.  You have chest pain or trouble breathing.  You do not pee at least once every 8 hours.  You have cold, clammy skin.  You keep throwing up after 24 to 48 hours.  You have a fever. MAKE SURE YOU:   Understand these instructions.  Will watch your condition.  Will get help right away if you are not doing well or get worse.   This information is not intended to replace advice given to you by your health care provider. Make sure you discuss any questions you have with your health care provider.   Document Released: 02/21/2008 Document Revised:  11/27/2011 Document Reviewed: 02/03/2011 Elsevier Interactive Patient Education 2016 [employer].   Diarrhea Diarrhea is watery poop (stool). It can make you feel weak, tired, thirsty, or give you a dry mouth (signs of dehydration). Watery poop is a sign of another problem, most often an infection. It often lasts 2-3 days. It can last longer if it is a sign of something serious. Take care of yourself as told by your doctor. HOME CARE   Drink 1 cup (8 ounces) of fluid each time you have watery poop.  Do not drink the following fluids:  Those that contain simple sugars (fructose, glucose, galactose, lactose, sucrose, maltose).  Sports drinks.  Fruit juices.  Whole milk products.  Sodas.  Drinks with caffeine (coffee, tea, soda) or alcohol.  Oral rehydration solution may be used if the doctor says it is okay. You may make your own solution. Follow this recipe:   - teaspoon table salt.   teaspoon baking soda.   teaspoon salt substitute containing potassium chloride.  1 tablespoons sugar.  1 liter (34 ounces) of water.  Avoid the following foods:  High fiber foods, such as raw fruits and vegetables.  Nuts, seeds, and whole grain breads and cereals.   Those that are sweetened with sugar alcohols (xylitol, sorbitol, mannitol).  Try eating the following foods:  Starchy foods, such as rice, toast, pasta, low-sugar cereal, oatmeal, baked potatoes, crackers, and bagels.  Bananas.  Applesauce.  Eat probiotic-rich foods, such as yogurt and milk products that are fermented.  Wash your hands well after each time you have watery poop.  Only take medicine as told by your doctor.  Take a warm bath to help lessen burning or pain from having watery poop. GET HELP RIGHT AWAY IF:   You cannot drink fluids without throwing up (vomiting).  You keep throwing up.  You have blood in your poop, or your poop looks black and tarry.  You do not pee (urinate) in 6-8 hours, or  there is only a small amount of very dark pee.  You have belly (abdominal) pain that gets worse or stays in the same spot (localizes).  You are weak, dizzy, confused, or light-headed.  You have a very bad headache.  Your watery poop gets worse or does not get better.  You have a fever or lasting symptoms for more than 2-3 days.  You have a fever and your symptoms suddenly get worse. MAKE SURE YOU:   Understand these instructions.  Will watch your condition.  Will get help right away if you are not doing well or get worse.   This information is not intended to replace advice given to you by your health care provider. Make sure you discuss any questions you have with your health care provider.   Document Released:  02/21/2008 Document Revised: 09/25/2014 Document Reviewed: 05/12/2012 Elsevier Interactive Patient Education [employer].

## 2016-02-22 NOTE — ED Provider Notes (Signed)
CSN: [id number]     Arrival date & time 02/22/16  1521 History   First MD Initiated Contact with Patient 02/22/16 1936     Chief Complaint  Patient presents with  . Abdominal Pain  . Nausea  . Diarrhea     (Consider location/radiation/quality/duration/timing/severity/associated sxs/prior Treatment) HPI Comments: Hannah Zimmerman is a 45 y.o. Female with history of pulmonary hypertension, and obesity s/p gastric sleeve 6 months ago presents to ED with abdominal pain, nausea, and diarrhea. Symptoms started approximately two days ago. Pain initially in right flank and RLQ. Now she describes pain in suprapubic region and RUQ. Pain is a 7/10, constant dull pain with intermittently sharper pains into right back. Associated symptoms include subjective fevers, chills, and decrease appetite. No blood in stool. No vomiting. No urinary complaints. No gynecologic complaints.   She has had a three cesarean sections, tubal ligation, vaginal hysterectomy, and  ovarian cyst. She underwent gastric sleeve surgery approximately 6 months ago and had an unremarkable post-operative period.   She was seen at [facility] two days ago for her symptoms and underwent a CT scan that was unremarkable for an acute pathology; however, there was a question of possible gallbladder sludge noted. She was seen today at [facility] for her symptoms and was prompted to come to ED for further evaluation of possible cholecystitis.       The history is provided by the patient and medical records.    Past Medical History  Diagnosis Date  . Tachycardia   . LVH (left ventricular hypertrophy)     with pulmonary hypertension  . Complication of anesthesia 20 years ago    spinal with first c section went too high stopped breathing, low bp with 2nd c section, no further issues with anesthesia  . Rash     both arms from old bed bug bites, healing   Past Surgical History  Procedure Laterality Date  . Abdominal hysterectomy     . Tubal ligation      x 2  . Cesarean section      x 3  . Ankle surgery      left x 2  . Laparoscopic gastric sleeve resection with hiatal hernia repair  08/17/2015    Procedure: LAPAROSCOPIC GASTRIC SLEEVE RESECTION WITH HIATAL HERNIA REPAIR;  Surgeon: Johnathan Hausen, MD;  Location: [facility] ORS;  Service: General;;  . Upper gi endoscopy  08/17/2015    Procedure: UPPER GI ENDOSCOPY;  Surgeon: Johnathan Hausen, MD;  Location: [facility];  Service: General;;   Family History  Problem Relation Age of Onset  . Hypertension Mother    Social History  Substance Use Topics  . Smoking status: Current Every Day Smoker -- 0.50 packs/day for 20 years    Types: Cigarettes    Last Attempt to Quit: 05/03/2015  . Smokeless tobacco: Never Used  . Alcohol Use: 0.5 oz/week    1 Standard drinks or equivalent per week     Comment: occasional   OB History    No data available     Review of Systems  Constitutional: Positive for fever ( 99), chills and appetite change ( decrease).  Gastrointestinal: Positive for nausea, abdominal pain and diarrhea.  All other systems reviewed and are negative.     Allergies  Codeine and Hydrocodone  Home Medications   Prior to Admission medications   Medication Sig Start Date End Date Taking? Authorizing Provider  calcium citrate-vitamin D (CITRACAL+D) 315-200 MG-UNIT tablet Take 1 tablet by mouth  2 (two) times daily.   Yes Historical Provider, MD  Multiple Vitamin (MULTIVITAMIN WITH MINERALS) TABS tablet Take 1 tablet by mouth 2 (two) times daily.   Yes Historical Provider, MD  dicyclomine (BENTYL) 20 MG tablet Take 1 tablet (20 mg total) by mouth every 6 (six) hours. 02/22/16   Roxanna Mew, PA-C  ondansetron (ZOFRAN ODT) 4 MG disintegrating tablet Take 1 tablet (4 mg total) by mouth every 8 (eight) hours as needed for nausea or vomiting. 02/22/16   Roxanna Mew, PA-C  oxyCODONE-acetaminophen (ROXICET) 5-325 MG tablet Take 1 tablet by mouth every 8 (eight)  hours as needed for severe pain. 02/22/16   Roxanna Mew, PA-C   BP 115/71 mmHg  Pulse 68  Temp(Src) 98 F (36.7 C) (Oral)  Resp 18  Wt 92.534 kg  SpO2 95% Physical Exam  Constitutional: She appears well-developed and well-nourished. No distress.  HENT:  Head: Normocephalic and atraumatic.  Mouth/Throat: Oropharynx is clear and moist. No oropharyngeal exudate.  Eyes: Conjunctivae and EOM are normal. Pupils are equal, round, and reactive to light. Right eye exhibits no discharge. Left eye exhibits no discharge. No scleral icterus.  Neck: Normal range of motion. Neck supple.  Cardiovascular: Normal rate, regular rhythm, normal heart sounds and intact distal pulses.   No murmur heard. Pulmonary/Chest: Effort normal and breath sounds normal. No respiratory distress.  Abdominal: Soft. Bowel sounds are normal. She exhibits no pulsatile midline mass. There is tenderness ( suprapubic, RLQ, RUQ). There is guarding. There is no rebound and negative Murphy's sign.  Obese abdomen with well healed incisions.   Musculoskeletal: Normal range of motion.  Lymphadenopathy:    She has no cervical adenopathy.  Neurological: She is alert. Coordination normal.  Skin: Skin is warm and dry. She is not diaphoretic.  Psychiatric: She has a normal mood and affect.    ED Course  Procedures (including critical care time) Labs Review Labs Reviewed  CBC - Abnormal; Notable for the following:    Hemoglobin 15.3 (*)    All other components within normal limits  URINALYSIS, ROUTINE W REFLEX MICROSCOPIC (NOT AT [facility]) - Abnormal; Notable for the following:    Color, Urine AMBER (*)    APPearance CLOUDY (*)    Bilirubin Urine MODERATE (*)    Ketones, ur >80 (*)    All other components within normal limits  LIPASE, BLOOD  COMPREHENSIVE METABOLIC PANEL    Imaging Review US Abdomen Complete  02/22/2016  CLINICAL DATA:  Abdominal pain for 3 days, right upper quadrant EXAM: ABDOMEN ULTRASOUND COMPLETE  COMPARISON:  CT from 2 days ago FINDINGS: Gallbladder: No gallstones or wall thickening visualized. No sonographic Murphy sign noted by sonographer. Common bile duct: Diameter: 3 mm Liver: No focal lesion identified. Within normal limits in parenchymal echogenicity. IVC: No abnormality visualized. Pancreas: Visualized portion unremarkable. Spleen: Size and appearance within normal limits. Right Kidney: Length: 11.4 cm. Echogenicity within normal limits. No mass or hydronephrosis visualized. Left Kidney: Length: 11.9 cm. Echogenicity within normal limits. No mass or hydronephrosis visualized. Abdominal aorta: No aneurysm visualized. IMPRESSION: Negative abdominal ultrasound.  No cholelithiasis. Electronically Signed   By: Monte Fantasia M.D.   On: 02/22/2016 21:34   I have personally reviewed and evaluated these images and lab results as part of my medical decision-making.   EKG Interpretation None      MDM   Final diagnoses:  Abdominal pain, unspecified abdominal location  Non-intractable vomiting with nausea, vomiting of unspecified type  Diarrhea,  unspecified type    Patient is afebrile and non-toxic appearing. Her vital signs are stable. Physical exam remarkable for TTP in suprapubic, RLQ, and RUQ with mild guarding. Review of CT scan two days ago showed normal appearing appendix/pancreas/colon, no evidecne of renal stones or hydronephrosis, and left ovarian cyst; however, there was a question of possible gallbladder sludge. Concern for possible acute cholecystitis vs. Cholangitis vs. Cholelithiasis. Will check CMP, CBC, lipase, U/A, and abdominal US. IVF, zofran, and pain medication.   CBC and CMP reassuring. Lipase negative. Ketones noted on UA, suspect dehydration from diarrhea. Bilirubin present as well, no hemoglobin. No sign of infection. Abdominal US negative for acute cholecystitis and is otherwise normal.   On re-evaluation, patient states pain is 2/10. She is still TTP in  suprapubic, RLQ, and RUQ. ?gastroenteritis vs. ?IBS. Discussed laboratory and [place] results with patient. Rx anti-nausea, anti-spasmodic, and pain medication. Clear liquids for next 24hrs. Encouraged follow up with PCP in next few days for re-evaluation. Discussed return precautions. Patient voiced understanding and is agreeable.     Roxanna Mew, [place] 02/23/16 2212  Tanna Furry, MD 03/09/16 [phone]

## 2016-02-22 NOTE — ED Notes (Signed)
PA at bedside.

## 2016-02-22 NOTE — ED Notes (Signed)
Patient c/o abd pain x 2 days. Was seen at [facility] then also at bariatric clinic.  Patient sent to ED due to nausea and diarrhea starting down.  Patient had gastric sleeve surgery 6 months ago.

## 2016-02-24 ENCOUNTER — Ambulatory Visit: Payer: Self-pay | Admitting: Dietician

## 2016-03-02 ENCOUNTER — Encounter: Payer: Self-pay | Admitting: Cardiovascular Disease

## 2016-03-02 ENCOUNTER — Ambulatory Visit (INDEPENDENT_AMBULATORY_CARE_PROVIDER_SITE_OTHER): Payer: 59 | Admitting: Cardiovascular Disease

## 2016-03-02 VITALS — BP 90/60 | HR 64 | Ht 64.0 in | Wt 206.8 lb

## 2016-03-02 DIAGNOSIS — R6889 Other general symptoms and signs: Secondary | ICD-10-CM | POA: Diagnosis not present

## 2016-03-02 DIAGNOSIS — Z720 Tobacco use: Secondary | ICD-10-CM | POA: Diagnosis not present

## 2016-03-02 DIAGNOSIS — Z09 Encounter for follow-up examination after completed treatment for conditions other than malignant neoplasm: Secondary | ICD-10-CM | POA: Diagnosis not present

## 2016-03-02 DIAGNOSIS — R000 Tachycardia, unspecified: Secondary | ICD-10-CM | POA: Diagnosis not present

## 2016-03-02 DIAGNOSIS — I272 Other secondary pulmonary hypertension: Secondary | ICD-10-CM

## 2016-03-02 DIAGNOSIS — F17200 Nicotine dependence, unspecified, uncomplicated: Secondary | ICD-10-CM

## 2016-03-02 DIAGNOSIS — R899 Unspecified abnormal finding in specimens from other organs, systems and tissues: Secondary | ICD-10-CM

## 2016-03-02 DIAGNOSIS — E669 Obesity, unspecified: Secondary | ICD-10-CM

## 2016-03-02 MED ORDER — VARENICLINE TARTRATE 1 MG PO TABS: 1.0000 mg | ORAL_TABLET | Freq: Two times a day (BID) | ORAL | Status: DC

## 2016-03-02 NOTE — Addendum Note (Signed)
Addended by: Dede Query R on: 03/02/2016 09:36 AM   Modules accepted: Orders

## 2016-03-02 NOTE — Patient Instructions (Addendum)
You are doing well. No medication changes were made.  Please call us if you have new issues that need to be addressed before your next appt.  Your physician wants you to follow-up in: 12 months.  You will receive a reminder letter in the mail two months in advance. If you don't receive a letter, please call our office to schedule the follow-up appointment.  Steps to Quit Smoking  Smoking tobacco can be harmful to your health and can affect almost every organ in your body. Smoking puts you, and those around you, at risk for developing many serious chronic diseases. Quitting smoking is difficult, but it is one of the best things that you can do for your health. It is never too late to quit. WHAT ARE THE BENEFITS OF QUITTING SMOKING? When you quit smoking, you lower your risk of developing serious diseases and conditions, such as:  Lung cancer or lung disease, such as COPD.  Heart disease.  Stroke.  Heart attack.  Infertility.  Osteoporosis and bone fractures. Additionally, symptoms such as coughing, wheezing, and shortness of breath may get better when you quit. You may also find that you get sick less often because your body is stronger at fighting off colds and infections. If you are pregnant, quitting smoking can help to reduce your chances of having a baby of low birth weight. HOW DO I GET READY TO QUIT? When you decide to quit smoking, create a plan to make sure that you are successful. Before you quit:  Pick a date to quit. Set a date within the next two weeks to give you time to prepare.  Write down the reasons why you are quitting. Keep this list in places where you will see it often, such as on your bathroom mirror or in your car or wallet.  Identify the people, places, things, and activities that make you want to smoke (triggers) and avoid them. Make sure to take these actions:  Throw away all cigarettes at home, at work, and in your car.  Throw away smoking accessories,  such as Scientist, research (medical).  Clean your car and make sure to empty the ashtray.  Clean your home, including curtains and carpets.  Tell your family, friends, and coworkers that you are quitting. Support from your loved ones can make quitting easier.  Talk with your health care provider about your options for quitting smoking.  Find out what treatment options are covered by your health insurance. WHAT STRATEGIES CAN I USE TO QUIT SMOKING?  Talk with your healthcare provider about different strategies to quit smoking. Some strategies include:  Quitting smoking altogether instead of gradually lessening how much you smoke over a period of time. Research shows that quitting "cold [place]" is more successful than gradually quitting.  Attending in-person counseling to help you build problem-solving skills. You are more likely to have success in quitting if you attend several counseling sessions. Even short sessions of 10 minutes can be effective.  Finding resources and support systems that can help you to quit smoking and remain smoke-free after you quit. These resources are most helpful when you use them often. They can include:  Online chats with a Social worker.  Telephone quitlines.  Printed Furniture conservator/restorer.  Support groups or group counseling.  Text messaging programs.  Mobile phone applications.  Taking medicines to help you quit smoking. (If you are pregnant or breastfeeding, talk with your health care provider first.) Some medicines contain nicotine and some do not. Both types  of medicines help with cravings, but the medicines that include nicotine help to relieve withdrawal symptoms. Your health care provider may recommend:  Nicotine patches, gum, or lozenges.  Nicotine inhalers or sprays.  Non-nicotine medicine that is taken by mouth. Talk with your health care provider about combining strategies, such as taking medicines while you are also receiving in-person counseling.  Using these two strategies together makes you more likely to succeed in quitting than if you used either strategy on its own. If you are pregnant or breastfeeding, talk with your health care provider about finding counseling or other support strategies to quit smoking. Do not take medicine to help you quit smoking unless told to do so by your health care provider. WHAT THINGS CAN I DO TO MAKE IT EASIER TO QUIT? Quitting smoking might feel overwhelming at first, but there is a lot that you can do to make it easier. Take these important actions:  Reach out to your family and friends and ask that they support and encourage you during this time. Call telephone quitlines, reach out to support groups, or work with a counselor for support.  Ask people who smoke to avoid smoking around you.  Avoid places that trigger you to smoke, such as bars, parties, or smoke-break areas at work.  Spend time around people who do not smoke.  Lessen stress in your life, because stress can be a smoking trigger for some people. To lessen stress, try:  Exercising regularly.  Deep-breathing exercises.  Yoga.  Meditating.  Performing a body scan. This involves closing your eyes, scanning your body from head to toe, and noticing which parts of your body are particularly tense. Purposefully relax the muscles in those areas.  Download or purchase mobile phone or tablet apps (applications) that can help you stick to your quit plan by providing reminders, tips, and encouragement. There are many free apps, such as QuitGuide from the [employer] Office manager for Disease Control and Prevention). You can find other support for quitting smoking (smoking cessation) through smokefree.gov and other websites. HOW WILL I FEEL WHEN I QUIT SMOKING? Within the first 24 hours of quitting smoking, you may start to feel some withdrawal symptoms. These symptoms are usually most noticeable 2-3 days after quitting, but they usually do not last beyond  2-3 weeks. Changes or symptoms that you might experience include:  Mood swings.  Restlessness, anxiety, or irritation.  Difficulty concentrating.  Dizziness.  Strong cravings for sugary foods in addition to nicotine.  Mild weight gain.  Constipation.  Nausea.  Coughing or a sore throat.  Changes in how your medicines work in your body.  A depressed mood.  Difficulty sleeping (insomnia). After the first 2-3 weeks of quitting, you may start to notice more positive results, such as:  Improved sense of smell and taste.  Decreased coughing and sore throat.  Slower heart rate.  Lower blood pressure.  Clearer skin.  The ability to breathe more easily.  Fewer sick days. Quitting smoking is very challenging for most people. Do not get discouraged if you are not successful the first time. Some people need to make many attempts to quit before they achieve long-term success. Do your best to stick to your quit plan, and talk with your health care provider if you have any questions or concerns.   This information is not intended to replace advice given to you by your health care provider. Make sure you discuss any questions you have with your health care provider.  Document Released: 08/29/2001 Document Revised: 01/19/2015 Document Reviewed: 01/19/2015 Elsevier Interactive Patient Education 2016 [employer]. Smoking Hazards Smoking cigarettes is extremely bad for your health. Tobacco smoke has over 200 known poisons in it. It contains the poisonous gases nitrogen oxide and carbon monoxide. There are over 60 chemicals in tobacco smoke that cause cancer. Some of the chemicals found in cigarette smoke include:   Cyanide.   Benzene.   Formaldehyde.   Methanol (wood alcohol).   Acetylene (fuel used in welding torches).   Ammonia.  Even smoking lightly shortens your life expectancy by several years. You can greatly reduce the risk of medical problems for you and your  family by stopping now. Smoking is the most preventable cause of death and disease in our society. Within days of quitting smoking, your circulation improves, you decrease the risk of having a heart attack, and your lung capacity improves. There may be some increased phlegm in the first few days after quitting, and it may take months for your lungs to clear up completely. Quitting for 10 years reduces your risk of developing lung cancer to almost that of a nonsmoker.  WHAT ARE THE RISKS OF SMOKING? Cigarette smokers have an increased risk of many serious medical problems, including:  Lung cancer.   Lung disease (such as pneumonia, bronchitis, and emphysema).   Heart attack and chest pain due to the heart not getting enough oxygen (angina).   Heart disease and peripheral blood vessel disease.   Hypertension.   Stroke.   Oral cancer (cancer of the lip, mouth, or voice box).   Bladder cancer.   Pancreatic cancer.   Cervical cancer.   Pregnancy complications, including premature birth.   Stillbirths and smaller newborn babies, birth defects, and genetic damage to sperm.   Early menopause.   Lower estrogen level for women.   Infertility.   Facial wrinkles.   Blindness.   Increased risk of broken bones (fractures).   Senile dementia.   Stomach ulcers and internal bleeding.   Delayed wound healing and increased risk of complications during surgery. Because of secondhand smoke exposure, children of smokers have an increased risk of the following:   Sudden infant death syndrome (SIDS).   Respiratory infections.   Lung cancer.   Heart disease.   Ear infections.  WHY IS SMOKING ADDICTIVE? Nicotine is the chemical agent in tobacco that is capable of causing addiction or dependence. When you smoke and inhale, nicotine is absorbed rapidly into the bloodstream through your lungs. Both inhaled and noninhaled nicotine may be addictive.  WHAT ARE THE  BENEFITS OF QUITTING?  There are many health benefits to quitting smoking. Some are:   The likelihood of developing cancer and heart disease decreases. Health improvements are seen almost immediately.   Blood pressure, pulse rate, and breathing patterns start returning to normal soon after quitting.   People who quit may see an improvement in their overall quality of life.  HOW DO YOU QUIT SMOKING? Smoking is an addiction with both physical and psychological effects, and longtime habits can be hard to change. Your health care provider can recommend:  Programs and community resources, which may include group support, education, or therapy.  Replacement products, such as patches, gum, and nasal sprays. Use these products only as directed. Do not replace cigarette smoking with electronic cigarettes (commonly called e-cigarettes). The safety of e-cigarettes is unknown, and some may contain harmful chemicals. FOR MORE INFORMATION  American Lung Association: www.lung.org  American Cancer Society: www.cancer.org  This information is not intended to replace advice given to you by your health care provider. Make sure you discuss any questions you have with your health care provider.   Document Released: 10/12/2004 Document Revised: 06/25/2013 Document Reviewed: 02/24/2013 Elsevier Interactive Patient Education 2016 [place] WHAT IS SECONDHAND SMOKE? Secondhand smoke is smoke that comes from burning tobacco. It could be the smoke from a cigarette, a pipe, or a cigar. Even if you are not the one smoking, secondhand smoke exposes you to the dangers of smoking. This is called involuntary, or passive, smoking. There are two types of secondhand smoke:  Sidestream smoke is the smoke that comes off the lighted end of a cigarette, pipe, or cigar.  This type of smoke has the highest amount of cancer-causing agents (carcinogens).  The particles in sidestream smoke are smaller.  They get into your lungs more easily.  Mainstream smoke is the smoke that is exhaled by a person who is smoking.  This type of smoke is also dangerous to your health. HOW CAN SECONDHAND SMOKE AFFECT MY HEALTH? Studies show that there is no safe level of secondhand smoke. This smoke contains thousands of chemicals. At least 62 of them are known to cause cancer. Secondhand smoke can also cause many other health problems. It has been linked to:  Lung cancer.  Cancer of the voice box (larynx) or throat.  Cancer of the sinuses.  Brain cancer.  Bladder cancer.  Stomach cancer.  Breast cancer.  White blood cell cancers (lymphoma and leukemia).  Brain and liver tumors in children.  Heart disease and stroke in adults.  Pregnancy loss (miscarriage).  Diseases in children, such as:  Asthma.  Lung infections.  Ear infections.  Sudden infant death syndrome (SIDS).  Slow growth. WHERE CAN I BE AT RISK FOR EXPOSURE TO SECONDHAND SMOKE?   For adults, the workplace is the main source of exposure to secondhand smoke.  Your workplace should have a policy separating smoking areas from nonsmoking areas.  Smoking areas should have a system for ventilating and cleaning the air.  For children, the home may be the most dangerous place for exposure to secondhand smoke.  Children who live in apartment buildings may be at risk from smoke drifting from hallways or other people's homes.  For everyone, many public places are possible sources of exposure to secondhand smoke.  These places include restaurants, shopping centers, and parks. HOW CAN I REDUCE MY RISK FOR EXPOSURE TO SECONDHAND SMOKE? The most important thing you can do is not smoke. Discourage family members from smoking. Other ways to reduce exposure for you and your family include the following:  Keep your home smoke free.  Make sure your child care providers do not smoke.  Warn your child about the dangers of smoking  and secondhand smoke.  Do not allow smoking in your car. When someone smokes in a car, all the damaging chemicals from the smoke are confined in a small area.  Avoid public places where smoking is allowed.   This information is not intended to replace advice given to you by your health care provider. Make sure you discuss any questions you have with your health care provider.   Document Released: 10/12/2004 Document Revised: 09/25/2014 Document Reviewed: 12/19/2013 Elsevier Interactive Patient Education [employer].

## 2016-03-02 NOTE — Progress Notes (Signed)
Patient ID: Hannah Zimmerman, female   DOB: 02-19-1971, 45 y.o.   MRN: [id number] Cardiology Office Note  Date:  03/02/2016   ID:  Hannah Zimmerman, DOB 05-Feb-1971, MRN [id number]  PCP:  Golden Pop, MD   Chief Complaint  Patient presents with  . Tachycardia  . follow up blood work  . follow up after exam    @ the bariatric surgen hadblood work & it was elevated, per pt    HPI:  Hannah Zimmerman is a very pleasant 45 year old woman with history of obesity, continued Smoking, previous history of tachycardia, pulmonary hypertension who presents for routine follow-up of her tachycardia and pulmonary hypertension, last seen several years ago  Since her last clinic visit she has had gastric bypass surgery   lost 130 pounds, feels well More active, breathing is better, better energy  she has placed herself on a vitamin patch No regular exercise regiment, followed by dietitian   Recent lab work reviewed with her showing elevated BNP of 2000 She denies significance leg swelling, infect this has improved with weight loss Blood pressure running low typically. Today systolic 90 She is asymptomatic, no orthostasis  Recently in the emergency room for tachycardia, hernia  EKG on today's visit shows normal sinus rhythm with rate 70 bpm, diffuse ST and T wave abnormality, no change from prior EKG  Other past medical history reviewed presented to the hospital with palpitations on May 21 2011. She had borderline elevated d-dimer and CT scan showed no embolus. Borderline elevated cardiac enzymes were felt to tachycardia. She was started on low-dose metoprolol tartrate. Echocardiogram showed normal LV systolic function, mild LVH, significantly elevated right ventricular systolic pressures estimated greater than 60 mm of mercury. BNP was 2500. She was started on Lasix 40 mg daily with potassium.   Previously on metoprolol and Lasix   EKG shows normal sinus rhythm with rate 78 beats per minute with  nonspecific ST abnormality in lead V3 to V6, II, III and AVF  PMH:   has a past medical history of Tachycardia; LVH (left ventricular hypertrophy); Complication of anesthesia (20 years ago); and Rash.  PSH:    Past Surgical History  Procedure Laterality Date  . Abdominal hysterectomy    . Tubal ligation      x 2  . Cesarean section      x 3  . Ankle surgery      left x 2  . Laparoscopic gastric sleeve resection with hiatal hernia repair  08/17/2015    Procedure: LAPAROSCOPIC GASTRIC SLEEVE RESECTION WITH HIATAL HERNIA REPAIR;  Surgeon: Johnathan Hausen, MD;  Location: [facility];  Service: General;;  . Upper gi endoscopy  08/17/2015    Procedure: UPPER GI ENDOSCOPY;  Surgeon: Johnathan Hausen, MD;  Location: [facility];  Service: General;;    Current Outpatient Prescriptions  Medication Sig Dispense Refill  . calcium citrate-vitamin D (CITRACAL+D) 315-200 MG-UNIT tablet Take 1 tablet by mouth 2 (two) times daily.    . Multiple Vitamin (MULTIVITAMIN WITH MINERALS) TABS tablet Take 1 tablet by mouth 2 (two) times daily.     No current facility-administered medications for this visit.     Allergies:   Codeine and Hydrocodone   Social History:  The patient  reports that she has been smoking Cigarettes.  She has a 10 pack-year smoking history. She has never used smokeless tobacco. She reports that she drinks about 0.5 oz of alcohol per week. She reports that she does not use illicit drugs.  Family History:   family history includes Hypertension in her mother.    Review of Systems: Review of Systems  Constitutional: Positive for weight loss.  Respiratory: Negative.   Cardiovascular: Negative.   Gastrointestinal: Negative.   Musculoskeletal: Negative.   Neurological: Negative.   Psychiatric/Behavioral: Negative.   All other systems reviewed and are negative.    PHYSICAL EXAM: VS:  BP 90/60 mmHg  Pulse 64  Ht 5\' 4"  (1.626 m)  Wt 206 lb 12.8 oz (93.804 kg)  BMI 35.48 kg/m2  SpO2  97% , BMI Body mass index is 35.48 kg/(m^2). GEN: Well nourished, well developed, in no acute distress HEENT: normal Neck: no JVD, carotid bruits, or masses Cardiac: RRR; 1+ murmur right sternal border, no rubs, or gallops,no edema  Respiratory:  clear to auscultation bilaterally, normal work of breathing GI: soft, nontender, nondistended, + BS MS: no deformity or atrophy Skin: warm and dry, no rash Neuro:  Strength and sensation are intact Psych: euthymic mood, full affect    Recent Labs: 02/22/2016: ALT 14; BUN 9; Creatinine, Ser 0.64; Hemoglobin 15.3*; Platelets 320; Potassium 4.0; Sodium 138    Lipid Panel No results found for: CHOL, HDL, LDLCALC, TRIG    Wt Readings from Last 3 Encounters:  03/02/16 206 lb 12.8 oz (93.804 kg)  02/22/16 204 lb (92.534 kg)  02/20/16 204 lb (92.534 kg)       ASSESSMENT AND PLAN:     Tachycardia - Plan: EKG 12-Lead Recent tachycardia likely secondary to hernia   no further testing needed If she has recurrent tachycardia, recommended she call our office. Would have difficulty using metoprolol given low blood pressure  Smoking Long discussion concerning smoking cessation. We will start Chantix, prescription provided  Obesity Recent gastric bypass surgery, over 100 pound weight loss, feels well  Pulmonary hypertension Recent measurement of BNP of 2000 Leg swelling better, no significant shortness of breath. Unable to add diuretics given low blood pressure Will monitor for now If she develops worsening shortness of breath, leg swelling, would repeat echocardiogram Plan discussed with her in detail  Disposition:   F/U  12 months   Total encounter time more than 25 minutes  Greater than 50% was spent in counseling and coordination of care with the patient    Orders Placed This Encounter  Procedures  . EKG 12-Lead     Signed, Esmond Plants, M.D., Ph.D. 03/02/2016  [place],  [place]

## 2017-03-02 ENCOUNTER — Emergency Department: Payer: 59

## 2017-03-02 ENCOUNTER — Emergency Department
Admission: EM | Admit: 2017-03-02 | Discharge: 2017-03-03 | Disposition: A | Discharge status: Home / Self Care | Payer: 59 | Attending: Emergency Medicine | Admitting: Emergency Medicine

## 2017-03-02 DIAGNOSIS — R002 Palpitations: Secondary | ICD-10-CM

## 2017-03-02 DIAGNOSIS — R51 Headache: Secondary | ICD-10-CM | POA: Diagnosis not present

## 2017-03-02 DIAGNOSIS — R519 Headache, unspecified: Secondary | ICD-10-CM

## 2017-03-02 DIAGNOSIS — F17210 Nicotine dependence, cigarettes, uncomplicated: Secondary | ICD-10-CM | POA: Diagnosis not present

## 2017-03-02 LAB — COMPREHENSIVE METABOLIC PANEL
ALT: 25 U/L (ref 14–54)
AST: 39 U/L (ref 15–41)
Albumin: 4.1 g/dL (ref 3.5–5.0)
Alkaline Phosphatase: 54 U/L (ref 38–126)
Anion gap: 6 (ref 5–15)
BUN: 10 mg/dL (ref 6–20)
CO2: 25 mmol/L (ref 22–32)
Calcium: 9.1 mg/dL (ref 8.9–10.3)
Chloride: 109 mmol/L (ref 101–111)
Creatinine, Ser: 0.89 mg/dL (ref 0.44–1.00)
GFR calc Af Amer: >60 mL/min (ref >60)
GFR calc non Af Amer: >60 mL/min (ref >60)
Glucose, Bld: 101 mg/dL — ABNORMAL HIGH (ref 65–99)
Potassium: 4.1 mmol/L (ref 3.5–5.1)
Sodium: 140 mmol/L (ref 135–145)
Total Bilirubin: 0.7 mg/dL (ref 0.3–1.2)
Total Protein: 6.7 g/dL (ref 6.5–8.1)

## 2017-03-02 LAB — CBC
HCT: 42.4 % (ref 35.0–47.0)
Hemoglobin: 14.7 g/dL (ref 12.0–16.0)
MCH: 32.7 pg (ref 26.0–34.0)
MCHC: 34.6 g/dL (ref 32.0–36.0)
MCV: 94.6 fL (ref 80.0–100.0)
Platelets: 273 10*3/uL (ref 150–440)
RBC: 4.49 MIL/uL (ref 3.80–5.20)
RDW: 14.3 % (ref 11.5–14.5)
WBC: 9.8 10*3/uL (ref 3.6–11.0)

## 2017-03-02 LAB — TROPONIN I: Troponin I: <0.03 ng/mL (ref <0.03)

## 2017-03-02 NOTE — ED Notes (Signed)
Patient c/o sharp/stabbing left-sided headache that has since resolved. Pt reports continued weakness/dizziness/lightheadedness. Pt reports earlier episode of diaphoresis with headache - ems confirms patient to have been diaphoretic at arrival. EMS reports presence of PVCs on EKG.  Patient c/o 3 episodes of diarrhea today. Patient denies visual changes, no neurological changes present on exam.

## 2017-03-02 NOTE — ED Triage Notes (Signed)
Patient c/o headache earlier today that was sharp/stabbing on left side of head - since resolved. Pt c/o weakness and nausea. Pt has hx of arrhythmias.

## 2017-03-02 NOTE — ED Notes (Signed)
Patient transported to CT 

## 2017-03-03 LAB — TSH: TSH: 2.854 u[IU]/mL (ref 0.350–4.500)

## 2017-03-03 LAB — MAGNESIUM: Magnesium: 2 mg/dL (ref 1.7–2.4)

## 2017-03-03 LAB — TROPONIN I: Troponin I: <0.03 ng/mL (ref <0.03)

## 2017-03-03 LAB — T4, FREE: Free T4: 1.01 ng/dL (ref 0.61–1.12)

## 2017-03-03 NOTE — Discharge Instructions (Signed)
Please follow up with your primary care physician.

## 2017-03-03 NOTE — ED Notes (Signed)
Reviewed d/c instructions, follow-up care with patient. Pt verbalized understanding.  

## 2017-03-03 NOTE — ED Provider Notes (Signed)
[facility] Emergency Department Provider Note   ____________________________________________   First MD Initiated Contact with Patient 03/03/17 0006     (approximate)  I have reviewed the triage vital signs and the nursing notes.   HISTORY  Chief Complaint Weakness and Headache    HPI Hannah Zimmerman is a 46 y.o. female who comes into the hospital today with a sharp pain on the left side of her head as well as feeling hot and flushed. She reports that she felt a sharp pain on the left side of her head and then a minute or 2 later her heart started racing. She felt hot and flushed and clammy. She reports that she just didn't feel right. The patient has a history of irregular heartbeat and arrhythmia so she decided to contact the ambulance. She reports that the fast rate lasted about 5-10 minutes until she called the ambulance and then until she arrived to the hospital. She reports that the hot flushed and clammy feeling went away after the palpitations stopped. The patient reports that her head pain went away completely as well. She's had a couple of episodes of diarrhea today. The patient has no chest pain no shortness of breath and no vomiting. She did have some nausea which she reports is different from her normal nausea. She is here today for evaluation.    Past Medical History:  Diagnosis Date  . Complication of anesthesia 20 years ago   spinal with first c section went too high stopped breathing, low bp with 2nd c section, no further issues with anesthesia  . LVH (left ventricular hypertrophy)    with pulmonary hypertension  . Rash    both arms from old bed bug bites, healing  . Tachycardia     Patient Active Problem List   Diagnosis Date Noted  . 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 Tachycardia 06/05/2011  . SOB (shortness of breath) 06/05/2011  . Pulmonary HTN ([place]) 06/05/2011  . Obesity 06/05/2011  . Smoking 06/05/2011      Past Surgical History:  Procedure Laterality Date  . ABDOMINAL HYSTERECTOMY    . ANKLE SURGERY     left x 2  . CESAREAN SECTION     x 3  . LAPAROSCOPIC GASTRIC SLEEVE RESECTION WITH HIATAL HERNIA REPAIR  08/17/2015   Procedure: LAPAROSCOPIC GASTRIC SLEEVE RESECTION WITH HIATAL HERNIA REPAIR;  Surgeon: Johnathan Hausen, MD;  Location: [facility];  Service: General;;  . TUBAL LIGATION     x 2  . UPPER GI ENDOSCOPY  08/17/2015   Procedure: UPPER GI ENDOSCOPY;  Surgeon: Johnathan Hausen, MD;  Location: [facility];  Service: General;;    Prior to Admission medications   Medication Sig Start Date End Date Taking? Authorizing Provider  calcium citrate-vitamin D (CITRACAL+D) 315-200 MG-UNIT tablet Take 1 tablet by mouth 2 (two) times daily.    [provider]  Multiple Vitamin (MULTIVITAMIN WITH MINERALS) TABS tablet Take 1 tablet by mouth 2 (two) times daily.    [provider]  varenicline (CHANTIX) 1 MG tablet Take 1 tablet (1 mg total) by mouth 2 (two) times daily. 03/02/16   Minna Merritts, MD    Allergies Codeine and Hydrocodone  Family History  Problem Relation Age of Onset  . Hypertension Mother     Social History Social History  Substance Use Topics  . Smoking status: Current Every Day Smoker    Packs/day: 0.50    Years: 20.00  Types: Cigarettes    Last attempt to quit: 05/03/2015  . Smokeless tobacco: Never Used  . Alcohol use 0.5 oz/week    1 Standard drinks or equivalent per week     Comment: occasional    Review of Systems  Constitutional: No fever/chills Eyes: No visual changes. ENT: No sore throat. Cardiovascular: Palpitations Respiratory: Denies shortness of breath. Gastrointestinal: Nausea, No abdominal pain. no vomiting.  No diarrhea.  No constipation. Genitourinary: Negative for dysuria. Musculoskeletal: Negative for back pain. Skin: Negative for rash. Neurological: Negative for headaches, focal weakness or  numbness.   ____________________________________________   PHYSICAL EXAM:  VITAL SIGNS: ED Triage Vitals  Enc Vitals Group     BP 03/02/17 2306 120/74     Pulse Rate 03/02/17 2306 83     Resp 03/02/17 2306 18     Temp 03/02/17 2306 97.8 F (36.6 C)     Temp Source 03/02/17 2306 Oral     SpO2 03/02/17 2306 96 %     Weight 03/02/17 2307 176 lb (79.8 kg)     Height 03/02/17 2307 5\' 4"  (1.626 m)     Head Circumference --      Peak Flow --      Pain Score --      Pain Loc --      Pain Edu? --      Excl. in [place]? --     Constitutional: Alert and oriented. Well appearing and in mild distress. Eyes: Conjunctivae are normal. PERRL. EOMI. Head: Atraumatic. Nose: No congestion/rhinnorhea. Mouth/Throat: Mucous membranes are moist.  Oropharynx non-erythematous. Cardiovascular: Normal rate, regular rhythm. Systolic murmur  Good peripheral circulation. Respiratory: Normal respiratory effort.  No retractions. Lungs CTAB. Gastrointestinal: Soft and nontender. No distention. Positive bowel sounds Musculoskeletal: No lower extremity tenderness nor edema.  Neurologic:  Normal speech and language. Cranial nerves II through XII are grossly intact with no focal motor or neuro deficits Skin:  Skin is warm, dry and intact.  Psychiatric: Mood and affect are normal.   ____________________________________________   LABS (all labs ordered are listed, but only abnormal results are displayed)  Labs Reviewed  COMPREHENSIVE METABOLIC PANEL - Abnormal; Notable for the following:       Result Value   Glucose, Bld 101 (*)    All other components within normal limits  CBC  TROPONIN I  MAGNESIUM  TSH  T4, FREE  TROPONIN I  URINALYSIS, COMPLETE (UACMP) WITH MICROSCOPIC   ____________________________________________  EKG  ED ECG REPORT I, Loney Hering, the attending physician, personally viewed and interpreted this ECG.   Date: 03/02/2017  EKG Time: 2314  Rate: 66  Rhythm: normal  sinus rhythm with sinus arrythmia  Axis: normal  Intervals:none  ST&T Change: Flipped T waves in leads V4 V5 and V6.  ____________________________________________  RADIOLOGY  Ct Head Wo Contrast  Result Date: 03/02/2017 CLINICAL DATA:  Acute onset of sharp left-sided headache, generalized weakness and nausea. Initial encounter. EXAM: CT HEAD WITHOUT CONTRAST TECHNIQUE: Contiguous axial images were obtained from the base of the skull through the vertex without intravenous contrast. COMPARISON:  CT of the head performed 04/24/2008 FINDINGS: Brain: No evidence of acute infarction, hemorrhage, hydrocephalus, extra-axial collection or mass lesion/mass effect. Prominence of the sulci suggests mild cortical volume loss. The brainstem and fourth ventricle are within normal limits. The basal ganglia are unremarkable in appearance. The cerebral hemispheres demonstrate grossly normal gray-white differentiation. No mass effect or midline shift is seen. Vascular: No hyperdense vessel or unexpected  calcification. Skull: There is no evidence of fracture; visualized osseous structures are unremarkable in appearance. Sinuses/Orbits: The visualized portions of the orbits are within normal limits. The paranasal sinuses and mastoid air cells are well-aerated. Other: No significant soft tissue abnormalities are seen. IMPRESSION: 1. No acute intracranial pathology seen on CT. 2. Mild cortical volume loss suggested. Electronically Signed   By: Garald Balding M.D.   On: 03/02/2017 23:32    ____________________________________________   PROCEDURES  Procedure(s) performed: None  Procedures  Critical Care performed: No  ____________________________________________   INITIAL IMPRESSION / ASSESSMENT AND PLAN / ED COURSE  Pertinent labs & imaging results that were available during my care of the patient were reviewed by me and considered in my medical decision making (see chart for details).  This is a  46 year old female who comes into the hospital today with some sharp head pain and then palpitations. The patient felt hot and clammy while she had these palpitations. I will give the patient liter of normal saline. I will check some blood work to include a troponin and magnesium and a TSH. I will reassess the patient once I received all of her results.  Clinical Course as of Mar 04 339  Sat Mar 03, 2017  0016 1. No acute intracranial pathology seen on CT. 2. Mild cortical volume loss suggested.   CT Head Wo Contrast [AW]    Clinical Course User Index [AW] Loney Hering, MD    The patient's symptoms have resolved by the time she arrived to the emergency department. I did perform 2 troponins as well as other blood work which was unremarkable. I am unsure of the cause of the patient's symptoms but I will discharge the patient home to follow-up with her primary care physician. ____________________________________________   FINAL CLINICAL IMPRESSION(S) / ED DIAGNOSES  Final diagnoses:  Palpitations  Acute nonintractable headache, unspecified headache type      NEW MEDICATIONS STARTED DURING THIS VISIT:  New Prescriptions   No medications on file     Note:  This document was prepared using Dragon voice recognition software and may include unintentional dictation errors.    Loney Hering, MD 03/03/17 [phone]

## 2017-06-17 NOTE — Progress Notes (Signed)
Patient ID: Hannah Zimmerman, female   DOB: 11-Nov-1970, 46 y.o.   MRN: [id number] Cardiology Office Note  Date:  06/18/2017   ID:  Hannah Zimmerman, DOB 05-22-1971, MRN [id number]  PCP:  Hannah Maple, MD   Chief Complaint  Patient presents with  . other    ED follow up. Patient states Palpitaitons only happen when she is dehydrated. Meds reviewed verbally with patient.    HPI:  Hannah Zimmerman is a very pleasant 46 year old woman with history of  obesity,  Smoking,  previous history of tachycardia,  pulmonary hypertension  Gastric bypass surgery 07/2015, down 130 pounds, stable weight who presents for routine follow-up of her tachycardia and pulmonary hypertension  In follow-up today she reports that she is doing well Dramatic weight loss after gastric bypass surgery 2 years ago  In ER 03/03/17 for weakness and H/A Hospital records reviewed with the patient in detail Reports she got dehydrated, heatstroke  Denies any significant palpitations, chest pain or shortness of breath  EKG on today's visit shows normal sinus rhythm with rate 61 bpm, diffuse ST and T wave abnormality, no change from prior EKG  Other past medical history reviewed presented to the hospital with palpitations on May 21 2011. She had borderline elevated d-dimer and CT scan showed no embolus. Borderline elevated cardiac enzymes were felt to tachycardia. She was started on low-dose metoprolol tartrate. Echocardiogram showed normal LV systolic function, mild LVH, significantly elevated right ventricular systolic pressures estimated greater than 60 mm of mercury. BNP was 2500. She was started on Lasix 40 mg daily with potassium.   Previously on metoprolol and Lasix    PMH:   has a past medical history of Complication of anesthesia (20 years ago); LVH (left ventricular hypertrophy); Rash; and Tachycardia.  PSH:    Past Surgical History:  Procedure Laterality Date  . ABDOMINAL HYSTERECTOMY    . ANKLE SURGERY      left x 2  . CESAREAN SECTION     x 3  . LAPAROSCOPIC GASTRIC SLEEVE RESECTION WITH HIATAL HERNIA REPAIR  08/17/2015   Procedure: LAPAROSCOPIC GASTRIC SLEEVE RESECTION WITH HIATAL HERNIA REPAIR;  Surgeon: Hannah Hausen, MD;  Location: [facility];  Service: General;;  . TUBAL LIGATION     x 2  . UPPER GI ENDOSCOPY  08/17/2015   Procedure: UPPER GI ENDOSCOPY;  Surgeon: Hannah Hausen, MD;  Location: [facility];  Service: General;;    Current Outpatient Prescriptions  Medication Sig Dispense Refill  . calcium citrate-vitamin D (CITRACAL+D) 315-200 MG-UNIT tablet Take 1 tablet by mouth 2 (two) times daily.    . Multiple Vitamin (MULTIVITAMIN WITH MINERALS) TABS tablet Take 1 tablet by mouth 2 (two) times daily.     No current facility-administered medications for this visit.      Allergies:   Codeine and Hydrocodone   Social History:  The patient  reports that she has been smoking Cigarettes.  She has a 10.00 pack-year smoking history. She has never used smokeless tobacco. She reports that she drinks about 0.5 oz of alcohol per week . She reports that she does not use drugs.   Family History:   family history includes Hypertension in her mother.    Review of Systems: Review of Systems  Constitutional: Positive for weight loss.  Respiratory: Negative.   Cardiovascular: Negative.   Gastrointestinal: Negative.   Musculoskeletal: Negative.   Neurological: Negative.   Psychiatric/Behavioral: Negative.   All other systems reviewed and are negative.  PHYSICAL EXAM: VS:  BP 108/70 (BP Location: Left Arm, Patient Position: Sitting, Cuff Size: Normal)   Pulse 61   Ht 5\' 4"  (1.626 m)   Wt 175 lb (79.4 kg)   BMI 30.04 kg/m  , BMI Body mass index is 30.04 kg/m.  GEN: Well nourished, well developed, in no acute distress  HEENT: normal  Neck: no JVD, carotid bruits, or masses Cardiac: RRR; 1+ murmur right sternal border, no rubs, or gallops,no edema  Respiratory:  clear to auscultation  bilaterally, normal work of breathing GI: soft, nontender, nondistended, + BS MS: no deformity or atrophy  Skin: warm and dry, no rash Neuro:  Strength and sensation are intact Psych: euthymic mood, full affect    Recent Labs: 03/02/2017: ALT 25; BUN 10; Creatinine, Ser 0.89; Hemoglobin 14.7; Magnesium 2.0; Platelets 273; Potassium 4.1; Sodium 140; TSH 2.854    Lipid Panel No results found for: CHOL, HDL, LDLCALC, TRIG    Wt Readings from Last 3 Encounters:  06/18/17 175 lb (79.4 kg)  03/02/17 176 lb (79.8 kg)  03/02/16 206 lb 12.8 oz (93.8 kg)       ASSESSMENT AND PLAN:   Tachycardia - Plan: EKG 12-Lead No recent episodes of tachycardia Typically has tachycardia in the setting of anxiety/stress or dehydration/heat stroke Trying to stay hydrated  Smoking Long discussion concerning smoking cessation.  Unable to afford Chantix. She will try other modalities  Obesity History of gastric bypass surgery, over 100 pound weight loss, feels well  Pulmonary hypertension Elevated right heart pressures prior to dramatic weight loss following gastric bypass surgery Leg swelling better, no significant shortness of breath.  Taken off beta blockers and diuretics given low blood pressure and improved symptoms  Disposition:   F/U  12 months as needed   Total encounter time more than 25 minutes  Greater than 50% was spent in counseling and coordination of care with the patient    No orders of the defined types were placed in this encounter.    Signed, Hannah Zimmerman, M.D., Ph.D. 06/18/2017  [place], [place]

## 2017-06-18 ENCOUNTER — Ambulatory Visit (INDEPENDENT_AMBULATORY_CARE_PROVIDER_SITE_OTHER): Payer: 59 | Admitting: Cardiovascular Disease

## 2017-06-18 ENCOUNTER — Encounter: Payer: Self-pay | Admitting: Cardiovascular Disease

## 2017-06-18 VITALS — BP 108/70 | HR 61 | Ht 64.0 in | Wt 175.0 lb

## 2017-06-18 DIAGNOSIS — Z9884 Bariatric surgery status: Secondary | ICD-10-CM

## 2017-06-18 DIAGNOSIS — R0602 Shortness of breath: Secondary | ICD-10-CM

## 2017-06-18 DIAGNOSIS — F17200 Nicotine dependence, unspecified, uncomplicated: Secondary | ICD-10-CM

## 2017-06-18 DIAGNOSIS — I272 Pulmonary hypertension, unspecified: Secondary | ICD-10-CM | POA: Diagnosis not present

## 2017-06-18 NOTE — Patient Instructions (Signed)

## 2017-10-09 ENCOUNTER — Telehealth: Payer: Self-pay | Admitting: Cardiovascular Disease

## 2017-10-09 NOTE — Telephone Encounter (Signed)
Pt states for the last week she has had episodes of her heart racing. Denies any CP, SOB. Please call.

## 2017-10-09 NOTE — Telephone Encounter (Signed)
No answer. Left message to call back.   

## 2017-10-09 NOTE — Telephone Encounter (Signed)
Patient calling back. Patient has been experiencing "racing heartrate" over the past week. Sometimes it may last 1-2 minutes and sometime 5-10 minutes. She was on metoprolol in the past which helped but about 2 years ago she was able to come off the medication. She has history of bariatric surgery and she routinely has nausea and dizziness r/t to this. She would prefer to see Dr Rockey Situ to discuss symptoms and plan of care. Patient scheduled to see Dr Rockey Situ on 10/11/17. Pt verbalized understanding to call 911 or go to the emergency room, if he develops any new or worsening symptoms.

## 2017-10-10 NOTE — Progress Notes (Signed)
Patient ID: AVRYL ROEHM, female   DOB: 27-Apr-1971, 47 y.o.   MRN: [id number] Cardiology Office Note  Date:  10/11/2017   ID:  Hannah Zimmerman, DOB 05/06/1971, MRN [id number]  PCP:  Hannah Maple, MD   Chief Complaint  Patient presents with  . other    Early 12 month follow up. racing heart beat worsening over the past week along with skip beats. Meds reviewed verbally with patient.     HPI:  Hannah Zimmerman is a very pleasant 47 year old woman with history of  obesity,  Smoking,  previous history of tachycardia,  pulmonary hypertension  Gastric bypass surgery 07/2015, down 130 pounds, stable weight who presents for routine follow-up of her tachycardia and pulmonary hypertension  Tachycardia, episodes for the past 3 weeks or so Possibly related to stress, significant stressors with husband, sick children Acute onset, lasting several minutes at a time Continues to have problems with hydration, electrolyte repletion Waxing waning diarrhea with constipation  Dramatic weight loss after gastric bypass surgery   Tachycardia comes on abruptly rate typically 100 bpm or higher Denies any significant shortness of breath at baseline When she has tachycardia she has lightheadedness/orthostasis  EKG personally reviewed by myself on todays visit Shows normal sinus rhythm rate 69 bpm T wave abnormality anterior lateral leads, inferior leads, no change from previous EKG dating back to 2017  Past medical history reviewed In ER 03/03/17 for weakness and H/A Reports she got dehydrated, heatstroke  presented to the hospital with palpitations on May 21 2011. She had borderline elevated d-dimer and CT scan showed no embolus. Borderline elevated cardiac enzymes were felt to tachycardia. She was started on low-dose metoprolol tartrate. Echocardiogram showed normal LV systolic function, mild LVH, significantly elevated right ventricular systolic pressures estimated greater than 60 mm of mercury.  BNP was 2500. She was started on Lasix 40 mg daily with potassium.   Previously on metoprolol and Lasix    PMH:   has a past medical history of Complication of anesthesia (20 years ago), LVH (left ventricular hypertrophy), Rash, and Tachycardia.  PSH:    Past Surgical History:  Procedure Laterality Date  . ABDOMINAL HYSTERECTOMY    . ANKLE SURGERY     left x 2  . CESAREAN SECTION     x 3  . LAPAROSCOPIC GASTRIC SLEEVE RESECTION WITH HIATAL HERNIA REPAIR  08/17/2015   Procedure: LAPAROSCOPIC GASTRIC SLEEVE RESECTION WITH HIATAL HERNIA REPAIR;  Surgeon: Johnathan Hausen, MD;  Location: [facility];  Service: General;;  . TUBAL LIGATION     x 2  . UPPER GI ENDOSCOPY  08/17/2015   Procedure: UPPER GI ENDOSCOPY;  Surgeon: Johnathan Hausen, MD;  Location: [facility];  Service: General;;    Current Outpatient Medications  Medication Sig Dispense Refill  . calcium citrate-vitamin D (CITRACAL+D) 315-200 MG-UNIT tablet Take 1 tablet by mouth 2 (two) times daily.    . Multiple Vitamin (MULTIVITAMIN WITH MINERALS) TABS tablet Take 1 tablet by mouth 2 (two) times daily.     No current facility-administered medications for this visit.      Allergies:   Codeine and Hydrocodone   Social History:  The patient  reports that she has been smoking cigarettes.  She has a 10.00 pack-year smoking history. she has never used smokeless tobacco. She reports that she drinks about 0.5 oz of alcohol per week. She reports that she does not use drugs.   Family History:   family history includes Hypertension in her mother.  Review of Systems: Review of Systems  Constitutional: Negative.   Respiratory: Negative.   Cardiovascular: Positive for palpitations.       Tachycardia  Gastrointestinal: Negative.   Musculoskeletal: Negative.   Neurological: Negative.   Psychiatric/Behavioral: Negative.   All other systems reviewed and are negative.    PHYSICAL EXAM: VS:  BP 112/70 (BP Location: Left Arm, Patient  Position: Sitting, Cuff Size: Normal)   Pulse 69   Ht 5\' 4"  (1.626 m)   Wt 176 lb (79.8 kg)   BMI 30.21 kg/m  , BMI Body mass index is 30.21 kg/m.  Constitutional:  oriented to person, place, and time. No distress.  HENT:  Head: Normocephalic and atraumatic.  Eyes:  no discharge. No scleral icterus.  Neck: Normal range of motion. Neck supple. No JVD present.  Cardiovascular: Normal rate, regular rhythm, normal heart sounds and intact distal pulses. Exam reveals no gallop and no friction rub. No edema No murmur heard. Pulmonary/Chest: Effort normal and breath sounds normal. No stridor. No respiratory distress.  no wheezes.  no rales.  no tenderness.  Abdominal: Soft.  no distension.  no tenderness.  Musculoskeletal: Normal range of motion.  no  tenderness or deformity.  Neurological:  normal muscle tone. Coordination normal. No atrophy Skin: Skin is warm and dry. No rash noted. not diaphoretic.  Psychiatric:  normal mood and affect. behavior is normal. Thought content normal.       Recent Labs: 03/02/2017: ALT 25; BUN 10; Creatinine, Ser 0.89; Hemoglobin 14.7; Magnesium 2.0; Platelets 273; Potassium 4.1; Sodium 140; TSH 2.854    Lipid Panel No results found for: CHOL, HDL, LDLCALC, TRIG    Wt Readings from Last 3 Encounters:  10/11/17 176 lb (79.8 kg)  06/18/17 175 lb (79.4 kg)  03/02/17 176 lb (79.8 kg)       ASSESSMENT AND PLAN:   Tachycardia - Plan: EKG 12-Lead Recent episodes of tachycardia Etiology unclear, possibly secondary to electrolyte abnormality versus stress She is not excited to take metoprolol given low blood pressure, orthostasis Blood pressure heart rate is stable, recommended she try propranolol 5-10 mg as needed  Recommended Electrolyte repletion and hydration  Smoking Long discussion concerning smoking cessation.  Unable to afford Chantix. She will try other modalities Discussed with her again today  Morbid obesity History of gastric bypass  surgery, over 100 pound weight loss, feels well Continued problems with waxing waning constipation and diarrhea Weight has been stable  Pulmonary hypertension Currently not on any medications, denies shortness of breath  Disposition:   F/U  12 months    Total encounter time more than 25 minutes  Greater than 50% was spent in counseling and coordination of care with the patient    Orders Placed This Encounter  Procedures  . EKG 12-Lead     Signed, Esmond Plants, M.D., Ph.D. 10/11/2017  [place], [place]

## 2017-10-11 ENCOUNTER — Ambulatory Visit: Payer: 59 | Admitting: Cardiovascular Disease

## 2017-10-11 ENCOUNTER — Encounter: Payer: Self-pay | Admitting: Cardiovascular Disease

## 2017-10-11 VITALS — BP 112/70 | HR 69 | Ht 64.0 in | Wt 176.0 lb

## 2017-10-11 DIAGNOSIS — I272 Pulmonary hypertension, unspecified: Secondary | ICD-10-CM

## 2017-10-11 DIAGNOSIS — R0602 Shortness of breath: Secondary | ICD-10-CM | POA: Diagnosis not present

## 2017-10-11 DIAGNOSIS — R000 Tachycardia, unspecified: Secondary | ICD-10-CM | POA: Diagnosis not present

## 2017-10-11 DIAGNOSIS — IMO0001 Reserved for inherently not codable concepts without codable children: Secondary | ICD-10-CM

## 2017-10-11 DIAGNOSIS — F17200 Nicotine dependence, unspecified, uncomplicated: Secondary | ICD-10-CM | POA: Diagnosis not present

## 2017-10-11 MED ORDER — PROPRANOLOL HCL 10 MG PO TABS: 10.0000 mg | ORAL_TABLET | Freq: Three times a day (TID) | ORAL | 3 refills | Status: DC | PRN

## 2017-10-11 NOTE — Patient Instructions (Signed)
Medication Instructions:   Please take propranolol as needed , 10 mg  Lasts 4 to 6 hours  Labwork:  No new labs needed  Testing/Procedures:  No further testing at this time   Follow-Up: It was a pleasure seeing you in the office today. Please call us if you have new issues that need to be addressed before your next appt.  [phone]  Your physician wants you to follow-up in: 12 months.  You will receive a reminder letter in the mail two months in advance. If you don't receive a letter, please call our office to schedule the follow-up appointment.  If you need a refill on your cardiac medications before your next appointment, please call your pharmacy.

## 2018-09-18 HISTORY — PX: LAPAROSCOPIC GASTRIC SLEEVE RESECTION: SHX5895

## 2019-07-30 ENCOUNTER — Inpatient Hospital Stay
Admission: EM | Admit: 2019-07-30 | Discharge: 2019-08-02 | DRG: 308 | Disposition: A | Discharge status: Home / Self Care | Payer: 59 | Attending: Internal Medicine | Admitting: Internal Medicine

## 2019-07-30 ENCOUNTER — Other Ambulatory Visit: Payer: Self-pay

## 2019-07-30 ENCOUNTER — Emergency Department: Payer: 59

## 2019-07-30 ENCOUNTER — Encounter: Payer: Self-pay | Admitting: Emergency Medicine

## 2019-07-30 DIAGNOSIS — Z79899 Other long term (current) drug therapy: Secondary | ICD-10-CM

## 2019-07-30 DIAGNOSIS — J209 Acute bronchitis, unspecified: Secondary | ICD-10-CM | POA: Diagnosis present

## 2019-07-30 DIAGNOSIS — I340 Nonrheumatic mitral (valve) insufficiency: Secondary | ICD-10-CM | POA: Diagnosis not present

## 2019-07-30 DIAGNOSIS — I4891 Unspecified atrial fibrillation: Secondary | ICD-10-CM | POA: Diagnosis not present

## 2019-07-30 DIAGNOSIS — Z6837 Body mass index (BMI) 37.0-37.9, adult: Secondary | ICD-10-CM

## 2019-07-30 DIAGNOSIS — I509 Heart failure, unspecified: Secondary | ICD-10-CM | POA: Diagnosis not present

## 2019-07-30 DIAGNOSIS — IMO0001 Reserved for inherently not codable concepts without codable children: Secondary | ICD-10-CM | POA: Diagnosis present

## 2019-07-30 DIAGNOSIS — Z9884 Bariatric surgery status: Secondary | ICD-10-CM

## 2019-07-30 DIAGNOSIS — I5082 Biventricular heart failure: Secondary | ICD-10-CM | POA: Diagnosis present

## 2019-07-30 DIAGNOSIS — J40 Bronchitis, not specified as acute or chronic: Secondary | ICD-10-CM | POA: Diagnosis present

## 2019-07-30 DIAGNOSIS — Z8249 Family history of ischemic heart disease and other diseases of the circulatory system: Secondary | ICD-10-CM

## 2019-07-30 DIAGNOSIS — I5033 Acute on chronic diastolic (congestive) heart failure: Secondary | ICD-10-CM | POA: Diagnosis present

## 2019-07-30 DIAGNOSIS — Z20828 Contact with and (suspected) exposure to other viral communicable diseases: Secondary | ICD-10-CM | POA: Diagnosis present

## 2019-07-30 DIAGNOSIS — Z833 Family history of diabetes mellitus: Secondary | ICD-10-CM

## 2019-07-30 DIAGNOSIS — F17200 Nicotine dependence, unspecified, uncomplicated: Secondary | ICD-10-CM

## 2019-07-30 DIAGNOSIS — Z90710 Acquired absence of both cervix and uterus: Secondary | ICD-10-CM | POA: Diagnosis not present

## 2019-07-30 DIAGNOSIS — I4819 Other persistent atrial fibrillation: Secondary | ICD-10-CM | POA: Diagnosis present

## 2019-07-30 DIAGNOSIS — R600 Localized edema: Secondary | ICD-10-CM | POA: Diagnosis not present

## 2019-07-30 DIAGNOSIS — I272 Pulmonary hypertension, unspecified: Secondary | ICD-10-CM | POA: Diagnosis present

## 2019-07-30 DIAGNOSIS — I2729 Other secondary pulmonary hypertension: Secondary | ICD-10-CM | POA: Diagnosis present

## 2019-07-30 DIAGNOSIS — F17210 Nicotine dependence, cigarettes, uncomplicated: Secondary | ICD-10-CM | POA: Diagnosis present

## 2019-07-30 DIAGNOSIS — R7401 Elevation of levels of liver transaminase levels: Secondary | ICD-10-CM | POA: Diagnosis present

## 2019-07-30 DIAGNOSIS — I361 Nonrheumatic tricuspid (valve) insufficiency: Secondary | ICD-10-CM | POA: Diagnosis not present

## 2019-07-30 DIAGNOSIS — I5031 Acute diastolic (congestive) heart failure: Secondary | ICD-10-CM | POA: Diagnosis not present

## 2019-07-30 LAB — HEPATIC FUNCTION PANEL
ALT: 127 U/L — ABNORMAL HIGH (ref 0–44)
AST: 74 U/L — ABNORMAL HIGH (ref 15–41)
Albumin: 3.3 g/dL — ABNORMAL LOW (ref 3.5–5.0)
Alkaline Phosphatase: 69 U/L (ref 38–126)
Bilirubin, Direct: 0.1 mg/dL (ref 0.0–0.2)
Indirect Bilirubin: 0.7 mg/dL (ref 0.3–0.9)
Total Bilirubin: 0.8 mg/dL (ref 0.3–1.2)
Total Protein: 5.6 g/dL — ABNORMAL LOW (ref 6.5–8.1)

## 2019-07-30 LAB — T4, FREE: Free T4: 1.09 ng/dL (ref 0.61–1.12)

## 2019-07-30 LAB — CBC
HCT: 44.2 % (ref 36.0–46.0)
Hemoglobin: 15.2 g/dL — ABNORMAL HIGH (ref 12.0–15.0)
MCH: 32.5 pg (ref 26.0–34.0)
MCHC: 34.4 g/dL (ref 30.0–36.0)
MCV: 94.4 fL (ref 80.0–100.0)
Platelets: 307 10*3/uL (ref 150–400)
RBC: 4.68 MIL/uL (ref 3.87–5.11)
RDW: 15.5 % (ref 11.5–15.5)
WBC: 9.8 10*3/uL (ref 4.0–10.5)
nRBC: 0 % (ref 0.0–0.2)

## 2019-07-30 LAB — BASIC METABOLIC PANEL
Anion gap: 7 (ref 5–15)
BUN: 14 mg/dL (ref 6–20)
CO2: 25 mmol/L (ref 22–32)
Calcium: 8.5 mg/dL — ABNORMAL LOW (ref 8.9–10.3)
Chloride: 107 mmol/L (ref 98–111)
Creatinine, Ser: 0.63 mg/dL (ref 0.44–1.00)
GFR calc Af Amer: >60 mL/min (ref >60)
GFR calc non Af Amer: >60 mL/min (ref >60)
Glucose, Bld: 93 mg/dL (ref 70–99)
Potassium: 4 mmol/L (ref 3.5–5.1)
Sodium: 139 mmol/L (ref 135–145)

## 2019-07-30 LAB — TROPONIN I (HIGH SENSITIVITY)
Troponin I (High Sensitivity): 24 ng/L — ABNORMAL HIGH (ref <18)
Troponin I (High Sensitivity): 23 ng/L — ABNORMAL HIGH (ref <18)

## 2019-07-30 LAB — MAGNESIUM
Magnesium: 2 mg/dL (ref 1.7–2.4)
Magnesium: 1.8 mg/dL (ref 1.7–2.4)

## 2019-07-30 LAB — TSH: TSH: 2.226 u[IU]/mL (ref 0.350–4.500)

## 2019-07-30 LAB — HIV ANTIBODY (ROUTINE TESTING W REFLEX): HIV Screen 4th Generation wRfx: NONREACTIVE

## 2019-07-30 LAB — BRAIN NATRIURETIC PEPTIDE: B Natriuretic Peptide: 918 pg/mL — ABNORMAL HIGH (ref 0.0–100.0)

## 2019-07-30 MED ORDER — ALPRAZOLAM 0.25 MG PO TABS: 0.2500 mg | ORAL_TABLET | Freq: Two times a day (BID) | ORAL | Status: DC | PRN

## 2019-07-30 MED ORDER — SODIUM CHLORIDE 0.9 % IV SOLN: 250.0000 mL | INTRAVENOUS | Status: DC | PRN

## 2019-07-30 MED ORDER — VITAMIN D 25 MCG (1000 UNIT) PO TABS
1000.0000 [IU] | ORAL_TABLET | Freq: Every day | ORAL | Status: DC
  Administered 2019-07-31 – 2019-08-02 (×3): 1000 [IU] via ORAL
  Filled 2019-07-30 (×4): qty 1

## 2019-07-30 MED ORDER — ONDANSETRON HCL 4 MG/2ML IJ SOLN: 4.0000 mg | Freq: Four times a day (QID) | INTRAMUSCULAR | Status: DC | PRN

## 2019-07-30 MED ORDER — SODIUM CHLORIDE 0.9% FLUSH: 3.0000 mL | INTRAVENOUS | Status: DC | PRN

## 2019-07-30 MED ORDER — DILTIAZEM HCL 25 MG/5ML IV SOLN
10.0000 mg | Freq: Once | INTRAVENOUS | Status: AC
  Administered 2019-07-30: 10 mg via INTRAVENOUS

## 2019-07-30 MED ORDER — SODIUM CHLORIDE 0.9% FLUSH
3.0000 mL | Freq: Two times a day (BID) | INTRAVENOUS | Status: DC
  Administered 2019-07-31 – 2019-08-02 (×3): 3 mL via INTRAVENOUS

## 2019-07-30 MED ORDER — DILTIAZEM HCL 25 MG/5ML IV SOLN
15.0000 mg | Freq: Once | INTRAVENOUS | Status: DC
  Filled 2019-07-30: qty 5

## 2019-07-30 MED ORDER — ACETAMINOPHEN 325 MG PO TABS
650.0000 mg | ORAL_TABLET | ORAL | Status: DC | PRN
  Administered 2019-08-01: 650 mg via ORAL
  Filled 2019-07-30: qty 2

## 2019-07-30 MED ORDER — IOHEXOL 350 MG/ML SOLN
75.0000 mL | Freq: Once | INTRAVENOUS | Status: AC | PRN
  Administered 2019-07-30: 75 mL via INTRAVENOUS

## 2019-07-30 MED ORDER — METOPROLOL TARTRATE 12.5 MG HALF TABLET
12.5000 mg | ORAL_TABLET | Freq: Two times a day (BID) | ORAL | Status: DC
  Administered 2019-07-30 – 2019-07-31 (×2): 12.5 mg via ORAL
  Filled 2019-07-30 (×3): qty 1

## 2019-07-30 MED ORDER — ASPIRIN EC 81 MG PO TBEC
81.0000 mg | DELAYED_RELEASE_TABLET | Freq: Every day | ORAL | Status: DC
  Administered 2019-07-31 – 2019-08-02 (×3): 81 mg via ORAL
  Filled 2019-07-30 (×4): qty 1

## 2019-07-30 MED ORDER — SODIUM CHLORIDE 0.9 % IV SOLN
500.0000 mg | INTRAVENOUS | Status: DC
  Administered 2019-07-30 – 2019-07-31 (×2): 500 mg via INTRAVENOUS
  Filled 2019-07-30 (×4): qty 500

## 2019-07-30 MED ORDER — FUROSEMIDE 10 MG/ML IJ SOLN
20.0000 mg | Freq: Two times a day (BID) | INTRAMUSCULAR | Status: DC
  Administered 2019-07-31 – 2019-08-01 (×3): 20 mg via INTRAVENOUS
  Filled 2019-07-30 (×2): qty 2
  Filled 2019-07-30: qty 4
  Filled 2019-07-30: qty 2

## 2019-07-30 MED ORDER — ACETAMINOPHEN 325 MG PO TABS: 650.0000 mg | ORAL_TABLET | ORAL | Status: DC | PRN

## 2019-07-30 MED ORDER — ENOXAPARIN SODIUM 40 MG/0.4ML ~~LOC~~ SOLN
40.0000 mg | SUBCUTANEOUS | Status: DC
  Filled 2019-07-30 (×3): qty 0.4

## 2019-07-30 MED ORDER — ADULT MULTIVITAMIN W/MINERALS CH
1.0000 | ORAL_TABLET | Freq: Two times a day (BID) | ORAL | Status: DC
  Administered 2019-07-31 – 2019-08-02 (×5): 1 via ORAL
  Filled 2019-07-30 (×6): qty 1

## 2019-07-30 MED ORDER — CALCIUM CITRATE-VITAMIN D 500-500 MG-UNIT PO CHEW
1.0000 | CHEWABLE_TABLET | Freq: Two times a day (BID) | ORAL | Status: DC
  Administered 2019-07-31 – 2019-08-02 (×5): 1 via ORAL
  Filled 2019-07-30 (×10): qty 1

## 2019-07-30 MED ORDER — SODIUM CHLORIDE 0.9% FLUSH
3.0000 mL | Freq: Two times a day (BID) | INTRAVENOUS | Status: DC
  Administered 2019-07-31 – 2019-08-02 (×4): 3 mL via INTRAVENOUS

## 2019-07-30 NOTE — ED Triage Notes (Signed)
Pt in via POV, reports bilateral leg swelling x 2 days, reports approximate 15lb weight gain in last week.  Ambulatory to triage, NAD noted at this time.

## 2019-07-30 NOTE — ED Notes (Signed)
Patient transported to CT 

## 2019-07-30 NOTE — ED Notes (Signed)
Pt has returned from CT. NAD noted. Pt attached to cardiac monitor.

## 2019-07-30 NOTE — ED Notes (Signed)
Admitting MD at bedside at this time.

## 2019-07-30 NOTE — ED Notes (Signed)
During triage, pt heart rate up to 160's.  EKG performed; new onset Afibb w/ RVR.

## 2019-07-30 NOTE — ED Notes (Signed)
Assumed care of patient denies pain/sob or any discomforts, sinus on monitor. Reports no hx of afib. Patient vss, awaiting bed status.

## 2019-07-30 NOTE — ED Notes (Signed)
EDP at bedside at this time.  

## 2019-07-30 NOTE — H&P (Addendum)
History and Physical    Hannah Zimmerman [id number] DOB: 05-30-1971 DOA: 07/30/2019  PCP: [name], MD  Patient coming from: Home  I have personally briefly reviewed patient's old medical records in [place]  Chief Complaint: Leg extremity swelling  HPI: Hannah Zimmerman is a 48 y.o. female with medical history significant of tachycardia, pulmonary hypertension, status post gastric sleeve presented to the ED with a 1 to 2-day history of bilateral lower extremity swelling, 3-day history of fatigue in her legs and generalized weakness.  Patient does endorse a 15 pound weight gain over the past week.  Patient also endorses some lightheadedness.  Patient denies any fever, no chills, no chest pain, no shortness of breath, no change in chronic cough, no melena, no hematemesis, no hematochezia, no diarrhea no constipation.  Patient denies any asymmetric weakness or numbness.  Patient denies any loss of taste or smell.  Patient denies any palpitations.  Patient denies any syncopal episodes.  Patient states had 5 watery loose stools yesterday and 1 watery loose stool today.  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  Patient denies any recent antibiotic use.  ED Course: Patient seen in the ED and on presentation and noted to have heart rates in the 160s and atrial fibrillation.  Blood pressure on admission was borderline hypotensive of 95/65.  Patient noted to have sats range from 92 to 100% on room air.  Basic metabolic profile obtained was unremarkable.  Magnesium level of 2.  Hepatic function with a AST of 74, ALT of 127 otherwise was within normal limits.  High-sensitivity troponin was elevated at 24.  BNP of 918.  CBC with a hemoglobin of 15.2 otherwise was unremarkable.  TSH of 2.226.  Free T4 1.09.  Chest x-ray negative for any acute cardiopulmonary disease.  CT  angiogram chest done negative for PE.  Diffuse bilateral bronchial wall thickening consistent with nonspecific infectious or inflammatory bronchitis.  Mild geographic airspace attenuation throughout the lungs suggestive of small airways disease and air trapping.  Cardiomegaly.  Enlargement of main pulmonary artery as can be seen in pulmonary hypertension.  Lower extremity Dopplers done negative for DVT, pulsatile flow demonstrated within the bilateral lower extremity venous systems nonspecific but could be seen in the setting of right-sided heart failure.  Incidentally noted approximately 3.2 cm left sided Baker's cyst.   EKG with A. fib with RVR heart rate of 134.  Patient given 10 mg IV dose of diltiazem with heart rates in the 80s and going up into the 140s but trending back down into the 80s - 100s.  COVID-19 test ordered and pending.  Review of Systems: As per HPI otherwise 10 point review of systems negative.   Past Medical History:  Diagnosis Date   Complication of anesthesia 20 years ago   spinal with first c section went too high stopped breathing, low bp with 2nd c section, no further issues with anesthesia   LVH (left ventricular hypertrophy)    with pulmonary hypertension   Rash    both arms from old bed bug bites, healing   Tachycardia     Past Surgical History:  Procedure Laterality Date   ABDOMINAL HYSTERECTOMY     ANKLE SURGERY     left x 2   CESAREAN SECTION     x 3   LAPAROSCOPIC GASTRIC SLEEVE RESECTION WITH HIATAL  HERNIA REPAIR  08/17/2015   Procedure: LAPAROSCOPIC GASTRIC SLEEVE RESECTION WITH HIATAL HERNIA REPAIR;  Surgeon: Johnathan Hausen, MD;  Location: [facility];  Service: General;;   TUBAL LIGATION     x 2   UPPER GI ENDOSCOPY  08/17/2015   Procedure: UPPER GI ENDOSCOPY;  Surgeon: Johnathan Hausen, MD;  Location: [facility];  Service: General;;     reports that she has been smoking cigarettes. She has a 10.00 pack-year smoking history. She has never used  smokeless tobacco. She reports current alcohol use of about 1.0 standard drinks of alcohol per week. She reports that she does not use drugs.  Allergies  Allergen Reactions   Codeine Hives        Hydrocodone Hives    Family History  Problem Relation Age of Onset   Hypertension Mother    Diabetes Mother    Heart failure Mother    Mother alive age 55 with diabetes, hypertension, CHF, history of hip replacement.  Father alive age 74 and healthy.  Prior to Admission medications   Medication Sig Start Date End Date Taking? Authorizing Provider  calcium citrate-vitamin D (CITRACAL+D) 315-200 MG-UNIT tablet Take 1 tablet by mouth 2 (two) times daily.   Yes [provider]  cholecalciferol (VITAMIN D3) 25 MCG (1000 UT) tablet Take 1,000 Units by mouth daily.   Yes [provider]  Multiple Vitamin (MULTIVITAMIN WITH MINERALS) TABS tablet Take 1 tablet by mouth 2 (two) times daily.   Yes [provider]    Physical Exam: Vitals:   07/30/19 1623 07/30/19 1630 07/30/19 1654 07/30/19 1700  BP:  106/60  111/80  Pulse: 67 (!) 163 64 82  Resp: 16 16 16  (!) 25  Temp:      TempSrc:      SpO2: 95%  95% 92%  Weight:      Height:        Constitutional: NAD, calm, comfortable Vitals:   07/30/19 1623 07/30/19 1630 07/30/19 1654 07/30/19 1700  BP:  106/60  111/80  Pulse: 67 (!) 163 64 82  Resp: 16 16 16  (!) 25  Temp:      TempSrc:      SpO2: 95%  95% 92%  Weight:      Height:       Eyes: PERRL, lids and conjunctivae normal ENMT: Mucous membranes are moist. Posterior pharynx clear of any exudate or lesions.Normal dentition.  Neck: normal, supple, no masses, no thyromegaly Respiratory: clear to auscultation bilaterally, no wheezing, no crackles. Normal respiratory effort. No accessory muscle use.  Cardiovascular: Irregularly irregular.  No murmurs rubs or gallops.  2+ bilateral lower extremity edema. No carotid bruits.  Abdomen: no tenderness, no masses  palpated. No hepatosplenomegaly. Bowel sounds positive.  Musculoskeletal: no clubbing / cyanosis. No joint deformity upper and lower extremities. Good ROM, no contractures. Normal muscle tone.  Skin: no rashes, lesions, ulcers. No induration Neurologic: CN 2-12 grossly intact. Sensation intact, DTR normal. Strength 5/5 in all 4.  Psychiatric: Normal judgment and insight. Alert and oriented x 3. Normal mood.   Labs on Admission: I have personally reviewed following labs and imaging studies  CBC: Recent Labs  Lab 07/30/19 1417  WBC 9.8  HGB 15.2*  HCT 44.2  MCV 94.4  PLT [id number]   Basic Metabolic Panel: Recent Labs  Lab 07/30/19 1417 07/30/19 1424  NA 139  --   K 4.0  --   CL 107  --   CO2 25  --  GLUCOSE 93  --   BUN 14  --   CREATININE 0.63  --   CALCIUM 8.5*  --   MG  --  2.0   GFR: Estimated Creatinine Clearance: 99.5 mL/min (by C-G formula based on SCr of 0.63 mg/dL). Liver Function Tests: Recent Labs  Lab 07/30/19 1424  AST 74*  ALT 127*  ALKPHOS 69  BILITOT 0.8  PROT 5.6*  ALBUMIN 3.3*   No results for input(s): LIPASE, AMYLASE in the last 168 hours. No results for input(s): AMMONIA in the last 168 hours. Coagulation Profile: No results for input(s): INR, PROTIME in the last 168 hours. Cardiac Enzymes: No results for input(s): CKTOTAL, CKMB, CKMBINDEX, TROPONINI in the last 168 hours. BNP (last 3 results) No results for input(s): PROBNP in the last 8760 hours. HbA1C: No results for input(s): HGBA1C in the last 72 hours. CBG: No results for input(s): GLUCAP in the last 168 hours. Lipid Profile: No results for input(s): CHOL, HDL, LDLCALC, TRIG, CHOLHDL, LDLDIRECT in the last 72 hours. Thyroid Function Tests: Recent Labs    07/30/19 1424  TSH 2.226  FREET4 1.09   Anemia Panel: No results for input(s): VITAMINB12, FOLATE, FERRITIN, TIBC, IRON, RETICCTPCT in the last 72 hours. Urine analysis:    Component Value Date/Time   COLORURINE AMBER (A)  02/22/2016 2037   APPEARANCEUR CLOUDY (A) 02/22/2016 2037   LABSPEC 1.030 02/22/2016 2037   PHURINE 5.5 02/22/2016 2037   GLUCOSEU NEGATIVE 02/22/2016 2037   HGBUR NEGATIVE 02/22/2016 2037   BILIRUBINUR MODERATE (A) 02/22/2016 2037   KETONESUR >80 (A) 02/22/2016 2037   PROTEINUR NEGATIVE 02/22/2016 2037   NITRITE NEGATIVE 02/22/2016 2037   LEUKOCYTESUR NEGATIVE 02/22/2016 2037    Radiological Exams on Admission: Dg Chest 2 View  Result Date: 07/30/2019 CLINICAL DATA:  New onset atrial fibrillation. Bilateral leg swelling. EXAM: CHEST - 2 VIEW COMPARISON:  December 02, 2014 FINDINGS: The heart size and mediastinal contours are within normal limits. Both lungs are clear. The visualized skeletal structures are unremarkable. IMPRESSION: No active cardiopulmonary disease. Electronically Signed   By: Dorise Bullion III M.D   On: 07/30/2019 15:33   Ct Angio Chest Pe W And/or Wo Contrast  Result Date: 07/30/2019 CLINICAL DATA:  Chest pain, tachycardia, leg swelling intermediate probability of PE EXAM: CT ANGIOGRAPHY CHEST WITH CONTRAST TECHNIQUE: Multidetector CT imaging of the chest was performed using the standard protocol during bolus administration of intravenous contrast. Multiplanar CT image reconstructions and MIPs were obtained to evaluate the vascular anatomy. CONTRAST:  35mL OMNIPAQUE IOHEXOL 350 MG/ML SOLN COMPARISON:  None. FINDINGS: Cardiovascular: Satisfactory opacification of the pulmonary arteries to the segmental level. No evidence of pulmonary embolism. Cardiomegaly. Enlargement of the main pulmonary artery up to 3.8 cm. No pericardial effusion. Mediastinum/Nodes: No enlarged mediastinal, hilar, or axillary lymph nodes. Thyroid gland, trachea, and esophagus demonstrate no significant findings. Lungs/Pleura: Diffuse bilateral bronchial wall thickening. Mild geographic airspace attenuation throughout the lungs. No pleural effusion or pneumothorax. Upper Abdomen: No acute abnormality.  Musculoskeletal: No chest wall abnormality. No acute or significant osseous findings. Review of the MIP images confirms the above findings. IMPRESSION: 1.  Negative examination for pulmonary embolism. 2. Diffuse bilateral bronchial wall thickening, consistent with nonspecific infectious or inflammatory bronchitis. 3. Mild geographic airspace attenuation throughout the lungs, suggestive of small airways disease and air trapping. 4.  Cardiomegaly. 5. Enlargement of the main pulmonary artery, as can be seen in pulmonary hypertension. Electronically Signed   By: Dorna Bloom.D.  On: 07/30/2019 16:30   US Venous Img Lower Bilateral  Result Date: 07/30/2019 CLINICAL DATA:  Bilateral lower extremity edema since last night, left greater than right. History of smoking. Evaluate for DVT. EXAM: BILATERAL LOWER EXTREMITY VENOUS DOPPLER ULTRASOUND TECHNIQUE: Gray-scale sonography with graded compression, as well as color Doppler and duplex ultrasound were performed to evaluate the lower extremity deep venous systems from the level of the common femoral vein and including the common femoral, femoral, profunda femoral, popliteal and calf veins including the posterior tibial, peroneal and gastrocnemius veins when visible. The superficial great saphenous vein was also interrogated. Spectral Doppler was utilized to evaluate flow at rest and with distal augmentation maneuvers in the common femoral, femoral and popliteal veins. COMPARISON:  None. FINDINGS: RIGHT LOWER EXTREMITY Common Femoral Vein: No evidence of thrombus. Normal compressibility, respiratory phasicity and response to augmentation. Saphenofemoral Junction: No evidence of thrombus. Normal compressibility and flow on color Doppler imaging. Profunda Femoral Vein: No evidence of thrombus. Normal compressibility and flow on color Doppler imaging. Femoral Vein: No evidence of thrombus. Normal compressibility, respiratory phasicity and response to augmentation.  Popliteal Vein: No evidence of thrombus. Normal compressibility, respiratory phasicity and response to augmentation. Calf Veins: No evidence of thrombus. Normal compressibility and flow on color Doppler imaging. Superficial Great Saphenous Vein: No evidence of thrombus. Normal compressibility. Venous Reflux:  None. Other Findings: Pulsatile flow is demonstrated within the majority of the right lower extremity venous system. LEFT LOWER EXTREMITY Common Femoral Vein: No evidence of thrombus. Normal compressibility, respiratory phasicity and response to augmentation. Saphenofemoral Junction: No evidence of thrombus. Normal compressibility and flow on color Doppler imaging. Profunda Femoral Vein: No evidence of thrombus. Normal compressibility and flow on color Doppler imaging. Femoral Vein: No evidence of thrombus. Normal compressibility, respiratory phasicity and response to augmentation. Popliteal Vein: No evidence of thrombus. Normal compressibility, respiratory phasicity and response to augmentation. Calf Veins: No evidence of thrombus. Normal compressibility and flow on color Doppler imaging. Superficial Great Saphenous Vein: No evidence of thrombus. Normal compressibility. Venous Reflux:  None. Other Findings: Pulsatile flow is demonstrated within majority of left lower extremity venous system note is made of an approximately 3.2 x 2.4 x 1.1 cm anechoic left-sided Baker's cyst. IMPRESSION: 1. No evidence DVT within either lower extremity. 2. Pulsatile flow demonstrated within the bilateral lower extremity venous systems, nonspecific though could be seen in the setting of right-sided heart failure. Clinical correlation is advised. 3. Incidentally noted approximately 3.2 cm left-sided Baker's cyst Electronically Signed   By: Sandi Mariscal M.D.   On: 07/30/2019 15:33    EKG: Independently reviewed.  A. fib with RVR heart rate 134.  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 fib noted to have heart rates in the 160s on presentation to the ED and EKG consistent with A. fib.  Patient with no prior history of A. fib however that has a history of tachycardia on propranolol as needed being followed by cardiology.  Patient denied any chest pain or palpitations.  Patient given a dose of Cardizem 10 mg IV x1 with heart rate in the 70s to 80s however going up into the 140s transiently and trending back down  in the 70s - 100s.  TSH and free T4 within normal limits.  High-sensitivity troponin slightly elevated at 24.  BNP noted to be elevated at 918.  Check a 2D echo.  Cycle cardiac enzymes.  Patient with a CHA2DS2-VASc of less than 2.  Placed on low-dose metoprolol 12.5 mg twice daily for rate control and monitor on telemetry in the stepdown unit.  If patient goes back into A. fib with RVR will need to be placed on a Cardizem drip.  Place on aspirin.  Consult with cardiology for further evaluation and management.  2.  Acute exacerbation of CHF/?  Right heart failure Likely secondary to new onset A. fib versus pulmonary hypertension.  Patient with complaints on presentation with bilateral lower extremity edema with 15 pound weight gain over the past week.  BNP noted to be elevated at 918.  High-sensitivity troponin at 24.  Patient also noted to have a transaminitis.  Patient denies any chest pain.  Check a 2D echo.  Cycle cardiac enzymes.  patient noted to have borderline blood pressure and as such we will place on low-dose Lasix 20 mg IV every 12 hours.  Placed on low-dose beta-blocker.  Strict I's and O's.  Daily weights.  Consult with cardiology for further evaluation and management.  3.  Bronchitis Noted on CT angiogram  chest.  Patient ongoing tobacco use.  Patient with recent exposure to Covid +19 patient.  COVID-19 test pending.  Placed on azithromycin.  Tobacco cessation.  4.  Tobacco abuse Tobacco cessation stressed to patient.  5.  Transaminitis Likely secondary to problem #2.  Check an acute hepatitis panel.  Follow.  6.  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  Patient states had 1 day episode of diarrhea with watery stools.  Patient denies any significant shortness of breath.  Patient not noted to be hypoxic.  COVID-19 test pending.  Place on droplet and contact precautions pending COVID-19 test.  Supportive care.    DVT prophylaxis: Lovenox Code Status: Full Family Communication: Updated patient and family at bedside. Disposition Plan: Likely home when clinically improved. Consults called: Cardiology: [facility] Admission status: Admit to inpatient/stepdown unit   Irine Seal MD [facility]  If 7PM-7AM, please contact night-coverage [email or web address]  07/30/2019, 6:27 PM

## 2019-07-30 NOTE — ED Provider Notes (Signed)
[facility] Emergency Department Provider Note  ____________________________________________   First MD Initiated Contact with Patient 07/30/19 1420     (approximate)  I have reviewed the triage vital signs and the nursing notes.   HISTORY  Chief Complaint Leg Swelling    HPI Hannah Zimmerman is a 48 y.o. female with gastric sleeve, pulmonary hypertension who comes in with bilateral leg swelling x2 days.  Patient is reporting a 15 pound weight gain in the last 1 week.. Denies any chest pain or palpitations.  She noticed the leg swelling yesterday.  The left leg is worse than the right leg.  Is been constant, nothing makes it better, nothing makes it worse.  She has had some prior surgeries on her left leg.  Denies any estrogen use, recent travel, prior blood clots.          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 08/17/2015   Tachycardia 06/05/2011   SOB (shortness of breath) 06/05/2011   Pulmonary HTN ([place]) 06/05/2011   Morbid obesity ([place]) 06/05/2011   Smoking 06/05/2011    Past Surgical History:  Procedure Laterality Date   ABDOMINAL HYSTERECTOMY     ANKLE SURGERY     left x 2   CESAREAN SECTION     x 3   LAPAROSCOPIC GASTRIC SLEEVE RESECTION WITH HIATAL HERNIA REPAIR  08/17/2015   Procedure: LAPAROSCOPIC GASTRIC SLEEVE RESECTION WITH HIATAL HERNIA REPAIR;  Surgeon: Johnathan Hausen, MD;  Location: [facility] ORS;  Service: General;;   TUBAL LIGATION     x 2   UPPER GI ENDOSCOPY  08/17/2015   Procedure: UPPER GI ENDOSCOPY;  Surgeon:  Johnathan Hausen, MD;  Location: [facility];  Service: General;;    Prior to Admission medications   Medication Sig Start Date End Date Taking? Authorizing Provider  calcium citrate-vitamin D (CITRACAL+D) 315-200 MG-UNIT tablet Take 1 tablet by mouth 2 (two) times daily.    [provider]  Multiple Vitamin (MULTIVITAMIN WITH MINERALS) TABS tablet Take 1 tablet by mouth 2 (two) times daily.    [provider]  propranolol (INDERAL) 10 MG tablet Take 1 tablet (10 mg total) by mouth 3 (three) times daily as needed. 10/11/17   Minna Merritts, MD    Allergies Codeine and Hydrocodone  Family History  Problem Relation Age of Onset   Hypertension Mother     Social History Social History   Tobacco Use   Smoking status: Current Every Day Smoker    Packs/day: 0.50    Years: 20.00    Pack years: 10.00    Types: Cigarettes    Last attempt to quit: 05/03/2015    Years since quitting: 4.2   Smokeless tobacco: Never Used  Substance Use Topics   Alcohol use: Yes    Alcohol/week: 1.0 standard drinks    Types: 1 Standard drinks or equivalent per week   Drug use: No      Review of Systems Constitutional: No fever/chills Eyes: No visual changes. ENT: No sore throat. Cardiovascular: No  chest pain Respiratory: No shortness of breath Gastrointestinal: No abdominal pain.  No nausea, no vomiting.  No diarrhea.  No constipation. Genitourinary: Negative for dysuria. Musculoskeletal: Negative for back pain.  Bilateral leg swelling Skin: Negative for rash. Neurological: Negative for headaches, focal weakness or numbness. All other ROS negative ____________________________________________   PHYSICAL EXAM:  VITAL SIGNS: ED Triage Vitals  Enc Vitals Group     BP 07/30/19 1405 (!) 136/106     Pulse Rate 07/30/19 1405 (!) 122     Resp 07/30/19 1405 16     Temp 07/30/19 1405 97.7 F (36.5 C)     Temp Source 07/30/19 1405 Oral     SpO2 07/30/19 1405 100 %     Weight  07/30/19 1406 219 lb (99.3 kg)     Height 07/30/19 1406 5\' 4"  (1.626 m)     Head Circumference --      Peak Flow --      Pain Score 07/30/19 1405 0     Pain Loc --      Pain Edu? --      Excl. in [place]? --     Constitutional: Alert and oriented. Well appearing and in no acute distress. Eyes: Conjunctivae are normal. EOMI. Head: Atraumatic. Nose: No congestion/rhinnorhea. Mouth/Throat: Mucous membranes are moist.   Neck: No stridor. Trachea Midline. FROM Cardiovascular: Normal rate, regular rhythm. Grossly normal heart sounds.  Good peripheral circulation. Respiratory: Normal respiratory effort.  No retractions. Lungs CTAB. Gastrointestinal: Soft and nontender. No distention. No abdominal bruits.  Musculoskeletal: 2+ edema bilaterally.  Left no joint effusions. Neurologic:  Normal speech and language. No gross focal neurologic deficits are appreciated.  Skin:  Skin is warm, dry and intact. No rash noted. Psychiatric: Mood and affect are normal. Speech and behavior are normal. GU: Deferred   ____________________________________________   LABS (all labs ordered are listed, but only abnormal results are displayed)  Labs Reviewed  BASIC METABOLIC PANEL - Abnormal; Notable for the following components:      Result Value   Calcium 8.5 (*)    All other components within normal limits  CBC - Abnormal; Notable for the following components:   Hemoglobin 15.2 (*)    All other components within normal limits  HEPATIC FUNCTION PANEL - Abnormal; Notable for the following components:   Total Protein 5.6 (*)    Albumin 3.3 (*)    AST 74 (*)    ALT 127 (*)    All other components within normal limits  BRAIN NATRIURETIC PEPTIDE - Abnormal; Notable for the following components:   B Natriuretic Peptide 918.0 (*)    All other components within normal limits  TROPONIN I (HIGH SENSITIVITY) - Abnormal; Notable for the following components:   Troponin I (High Sensitivity) 24 (*)    All other  components within normal limits  MAGNESIUM  TSH  T4, FREE  POC URINE PREG, ED   ____________________________________________   ED ECG REPORT I, Vanessa McGregor, the attending physician, personally viewed and interpreted this ECG.  EKG is atrial fibrillation rate of 134, no ST elevation, T wave inversion in V5 and V6, normal intervals   ____________________________________________  RADIOLOGY Robert Bellow, personally viewed and evaluated these images (plain radiographs) as part of my medical decision making, as well as reviewing the written report by the radiologist.  ED MD interpretation: Chest x-ray no pneumonia  Official radiology report(s): Dg Chest 2 View  Result Date: 07/30/2019 CLINICAL DATA:  New onset atrial  fibrillation. Bilateral leg swelling. EXAM: CHEST - 2 VIEW COMPARISON:  December 02, 2014 FINDINGS: The heart size and mediastinal contours are within normal limits. Both lungs are clear. The visualized skeletal structures are unremarkable. IMPRESSION: No active cardiopulmonary disease. Electronically Signed   By: Dorise Bullion III M.D   On: 07/30/2019 15:33   Ct Angio Chest Pe W And/or Wo Contrast  Result Date: 07/30/2019 CLINICAL DATA:  Chest pain, tachycardia, leg swelling intermediate probability of PE EXAM: CT ANGIOGRAPHY CHEST WITH CONTRAST TECHNIQUE: Multidetector CT imaging of the chest was performed using the standard protocol during bolus administration of intravenous contrast. Multiplanar CT image reconstructions and MIPs were obtained to evaluate the vascular anatomy. CONTRAST:  53mL OMNIPAQUE IOHEXOL 350 MG/ML SOLN COMPARISON:  None. FINDINGS: Cardiovascular: Satisfactory opacification of the pulmonary arteries to the segmental level. No evidence of pulmonary embolism. Cardiomegaly. Enlargement of the main pulmonary artery up to 3.8 cm. No pericardial effusion. Mediastinum/Nodes: No enlarged mediastinal, hilar, or axillary lymph nodes. Thyroid gland, trachea, and  esophagus demonstrate no significant findings. Lungs/Pleura: Diffuse bilateral bronchial wall thickening. Mild geographic airspace attenuation throughout the lungs. No pleural effusion or pneumothorax. Upper Abdomen: No acute abnormality. Musculoskeletal: No chest wall abnormality. No acute or significant osseous findings. Review of the MIP images confirms the above findings. IMPRESSION: 1.  Negative examination for pulmonary embolism. 2. Diffuse bilateral bronchial wall thickening, consistent with nonspecific infectious or inflammatory bronchitis. 3. Mild geographic airspace attenuation throughout the lungs, suggestive of small airways disease and air trapping. 4.  Cardiomegaly. 5. Enlargement of the main pulmonary artery, as can be seen in pulmonary hypertension. Electronically Signed   By: Eddie Candle M.D.   On: 07/30/2019 16:30   US Venous Img Lower Bilateral  Result Date: 07/30/2019 CLINICAL DATA:  Bilateral lower extremity edema since last night, left greater than right. History of smoking. Evaluate for DVT. EXAM: BILATERAL LOWER EXTREMITY VENOUS DOPPLER ULTRASOUND TECHNIQUE: Gray-scale sonography with graded compression, as well as color Doppler and duplex ultrasound were performed to evaluate the lower extremity deep venous systems from the level of the common femoral vein and including the common femoral, femoral, profunda femoral, popliteal and calf veins including the posterior tibial, peroneal and gastrocnemius veins when visible. The superficial great saphenous vein was also interrogated. Spectral Doppler was utilized to evaluate flow at rest and with distal augmentation maneuvers in the common femoral, femoral and popliteal veins. COMPARISON:  None. FINDINGS: RIGHT LOWER EXTREMITY Common Femoral Vein: No evidence of thrombus. Normal compressibility, respiratory phasicity and response to augmentation. Saphenofemoral Junction: No evidence of thrombus. Normal compressibility and flow on color  Doppler imaging. Profunda Femoral Vein: No evidence of thrombus. Normal compressibility and flow on color Doppler imaging. Femoral Vein: No evidence of thrombus. Normal compressibility, respiratory phasicity and response to augmentation. Popliteal Vein: No evidence of thrombus. Normal compressibility, respiratory phasicity and response to augmentation. Calf Veins: No evidence of thrombus. Normal compressibility and flow on color Doppler imaging. Superficial Great Saphenous Vein: No evidence of thrombus. Normal compressibility. Venous Reflux:  None. Other Findings: Pulsatile flow is demonstrated within the majority of the right lower extremity venous system. LEFT LOWER EXTREMITY Common Femoral Vein: No evidence of thrombus. Normal compressibility, respiratory phasicity and response to augmentation. Saphenofemoral Junction: No evidence of thrombus. Normal compressibility and flow on color Doppler imaging. Profunda Femoral Vein: No evidence of thrombus. Normal compressibility and flow on color Doppler imaging. Femoral Vein: No evidence of thrombus. Normal compressibility, respiratory phasicity and response to augmentation. Popliteal Vein:  No evidence of thrombus. Normal compressibility, respiratory phasicity and response to augmentation. Calf Veins: No evidence of thrombus. Normal compressibility and flow on color Doppler imaging. Superficial Great Saphenous Vein: No evidence of thrombus. Normal compressibility. Venous Reflux:  None. Other Findings: Pulsatile flow is demonstrated within majority of left lower extremity venous system note is made of an approximately 3.2 x 2.4 x 1.1 cm anechoic left-sided Baker's cyst. IMPRESSION: 1. No evidence DVT within either lower extremity. 2. Pulsatile flow demonstrated within the bilateral lower extremity venous systems, nonspecific though could be seen in the setting of right-sided heart failure. Clinical correlation is advised. 3. Incidentally noted approximately 3.2 cm  left-sided Baker's cyst Electronically Signed   By: Sandi Mariscal M.D.   On: 07/30/2019 15:33    ____________________________________________   PROCEDURES  Procedure(s) performed (including Critical Care):  .Critical Care Performed by: Vanessa Lenoir, MD Authorized by: Vanessa [place], MD   Critical care provider statement:    Critical care time (minutes):  31   Critical care was necessary to treat or prevent imminent or life-threatening deterioration of the following conditions:  Cardiac failure   Critical care was time spent personally by me on the following activities:  Discussions with consultants, evaluation of patient's response to treatment, examination of patient, ordering and performing treatments and interventions, ordering and review of laboratory studies, ordering and review of radiographic studies, pulse oximetry, re-evaluation of patient's condition, obtaining history from patient or surrogate and review of old charts     ____________________________________________   INITIAL IMPRESSION / ASSESSMENT AND PLAN / ED COURSE  Hannah Zimmerman was evaluated in Emergency Department on 07/30/2019 for the symptoms described in the history of present illness. She was evaluated in the context of the global COVID-19 pandemic, which necessitated consideration that the patient might be at risk for infection with the SARS-CoV-2 virus that causes COVID-19. Institutional protocols and algorithms that pertain to the evaluation of patients at risk for COVID-19 are in a state of rapid change based on information released by regulatory bodies including the CDC and federal and state organizations. These policies and algorithms were followed during the patient's care in the ED.    Patient is EKG is consistent with new atrial fibrillation.  Will get labs evaluate for electrolyte abnormalities, AKI, thyroid issues.  Will get ultrasounds given the left leg is larger than the right leg to evaluate for  DVTs.  We will give a dose of diltiazem to help slow her heart rates.  I suspect that her leg swelling is secondary to her heart not tolerating the A. fib which is putting out into heart failure.   Labs notable for troponin of 24.  Most likely demand from the A. fib.  Her BNP is elevated 918 which is consistent with her fluid overload.  LFTs are slightly elevated but no right upper quadrant pain.  No evidence of thyroid issues.  Electrolytes are normal.  Ultrasound was concerning for right heart failure.  Will get CT scan shows no evidence of PE.  CT is without PE.  Patient's heart has come down with diltiazem.  Will discuss with hospital team for admission.   ____________________________________________   FINAL CLINICAL IMPRESSION(S) / ED DIAGNOSES   Final diagnoses:  Leg edema  Atrial fibrillation with rapid ventricular response (HCC)      MEDICATIONS GIVEN DURING THIS VISIT:  Medications  diltiazem (CARDIZEM) injection 10 mg (10 mg Intravenous Given 07/30/19 1600)  iohexol (OMNIPAQUE) 350 MG/ML injection 75 mL (  75 mLs Intravenous Contrast Given 07/30/19 1607)     ED Discharge Orders    None       Note:  This document was prepared using Dragon voice recognition software and may include unintentional dictation errors.   Vanessa Spencer, MD 07/30/19 [id number]

## 2019-07-31 ENCOUNTER — Inpatient Hospital Stay (HOSPITAL_COMMUNITY)
Admit: 2019-07-31 | Discharge: 2019-07-31 | Disposition: A | Discharge status: Home / Self Care | Payer: 59 | Attending: Internal Medicine | Admitting: Internal Medicine

## 2019-07-31 DIAGNOSIS — I5031 Acute diastolic (congestive) heart failure: Secondary | ICD-10-CM

## 2019-07-31 DIAGNOSIS — J40 Bronchitis, not specified as acute or chronic: Secondary | ICD-10-CM | POA: Diagnosis present

## 2019-07-31 DIAGNOSIS — I4891 Unspecified atrial fibrillation: Principal | ICD-10-CM

## 2019-07-31 DIAGNOSIS — I361 Nonrheumatic tricuspid (valve) insufficiency: Secondary | ICD-10-CM

## 2019-07-31 DIAGNOSIS — R600 Localized edema: Secondary | ICD-10-CM

## 2019-07-31 DIAGNOSIS — I340 Nonrheumatic mitral (valve) insufficiency: Secondary | ICD-10-CM

## 2019-07-31 LAB — COMPREHENSIVE METABOLIC PANEL
ALT: 98 U/L — ABNORMAL HIGH (ref 0–44)
AST: 51 U/L — ABNORMAL HIGH (ref 15–41)
Albumin: 2.9 g/dL — ABNORMAL LOW (ref 3.5–5.0)
Alkaline Phosphatase: 63 U/L (ref 38–126)
Anion gap: 7 (ref 5–15)
BUN: 12 mg/dL (ref 6–20)
CO2: 22 mmol/L (ref 22–32)
Calcium: 8 mg/dL — ABNORMAL LOW (ref 8.9–10.3)
Chloride: 112 mmol/L — ABNORMAL HIGH (ref 98–111)
Creatinine, Ser: 0.51 mg/dL (ref 0.44–1.00)
GFR calc Af Amer: >60 mL/min (ref >60)
GFR calc non Af Amer: >60 mL/min (ref >60)
Glucose, Bld: 79 mg/dL (ref 70–99)
Potassium: 3.9 mmol/L (ref 3.5–5.1)
Sodium: 141 mmol/L (ref 135–145)
Total Bilirubin: 1 mg/dL (ref 0.3–1.2)
Total Protein: 4.7 g/dL — ABNORMAL LOW (ref 6.5–8.1)

## 2019-07-31 LAB — LIPID PANEL
Cholesterol: 121 mg/dL (ref 0–200)
HDL: 35 mg/dL — ABNORMAL LOW (ref >40)
LDL Cholesterol: 72 mg/dL (ref 0–99)
Total CHOL/HDL Ratio: 3.5 RATIO
Triglycerides: 68 mg/dL (ref <150)
VLDL: 14 mg/dL (ref 0–40)

## 2019-07-31 LAB — CBC
HCT: 42 % (ref 36.0–46.0)
Hemoglobin: 14.1 g/dL (ref 12.0–15.0)
MCH: 32.9 pg (ref 26.0–34.0)
MCHC: 33.6 g/dL (ref 30.0–36.0)
MCV: 97.9 fL (ref 80.0–100.0)
Platelets: 254 10*3/uL (ref 150–400)
RBC: 4.29 MIL/uL (ref 3.87–5.11)
RDW: 15.6 % — ABNORMAL HIGH (ref 11.5–15.5)
WBC: 7.1 10*3/uL (ref 4.0–10.5)
nRBC: 0 % (ref 0.0–0.2)

## 2019-07-31 LAB — ECHOCARDIOGRAM COMPLETE
Height: 64 in
Weight: 3504 oz

## 2019-07-31 LAB — HEPATITIS PANEL, ACUTE
HCV Ab: NONREACTIVE
Hep A IgM: NONREACTIVE
Hep B C IgM: NONREACTIVE
Hepatitis B Surface Ag: NONREACTIVE

## 2019-07-31 LAB — SARS CORONAVIRUS 2 (TAT 6-24 HRS): SARS Coronavirus 2: NEGATIVE

## 2019-07-31 LAB — TROPONIN I (HIGH SENSITIVITY): Troponin I (High Sensitivity): 22 ng/L — ABNORMAL HIGH (ref <18)

## 2019-07-31 MED ORDER — APIXABAN 5 MG PO TABS
5.0000 mg | ORAL_TABLET | Freq: Two times a day (BID) | ORAL | Status: DC
  Administered 2019-07-31 – 2019-08-02 (×5): 5 mg via ORAL
  Filled 2019-07-31 (×6): qty 1

## 2019-07-31 MED ORDER — METOPROLOL TARTRATE 12.5 MG HALF TABLET
12.5000 mg | ORAL_TABLET | Freq: Once | ORAL | Status: AC
  Administered 2019-07-31: 12.5 mg via ORAL
  Filled 2019-07-31: qty 1

## 2019-07-31 MED ORDER — LEVALBUTEROL HCL 0.63 MG/3ML IN NEBU
0.6300 mg | INHALATION_SOLUTION | Freq: Four times a day (QID) | RESPIRATORY_TRACT | Status: DC
  Administered 2019-07-31 – 2019-08-01 (×3): 0.63 mg via RESPIRATORY_TRACT
  Filled 2019-07-31 (×6): qty 3

## 2019-07-31 MED ORDER — IPRATROPIUM BROMIDE 0.02 % IN SOLN
0.5000 mg | Freq: Four times a day (QID) | RESPIRATORY_TRACT | Status: DC
  Administered 2019-07-31 – 2019-08-01 (×3): 0.5 mg via RESPIRATORY_TRACT
  Filled 2019-07-31 (×6): qty 2.5

## 2019-07-31 MED ORDER — METOPROLOL TARTRATE 25 MG PO TABS
25.0000 mg | ORAL_TABLET | Freq: Two times a day (BID) | ORAL | Status: DC
  Administered 2019-07-31 – 2019-08-02 (×4): 25 mg via ORAL
  Filled 2019-07-31: qty 1
  Filled 2019-07-31: qty 0.5
  Filled 2019-07-31 (×3): qty 1

## 2019-07-31 MED ORDER — MAGNESIUM SULFATE 2 GM/50ML IV SOLN
2.0000 g | Freq: Once | INTRAVENOUS | Status: AC
  Administered 2019-07-31: 2 g via INTRAVENOUS
  Filled 2019-07-31: qty 50

## 2019-07-31 MED ORDER — METHYLPREDNISOLONE SODIUM SUCC 125 MG IJ SOLR
60.0000 mg | Freq: Once | INTRAMUSCULAR | Status: AC
  Administered 2019-07-31: 60 mg via INTRAVENOUS
  Filled 2019-07-31: qty 2

## 2019-07-31 MED ORDER — DIGOXIN 0.25 MG/ML IJ SOLN
0.2500 mg | INTRAMUSCULAR | Status: AC
  Administered 2019-07-31 (×2): 0.25 mg via INTRAVENOUS
  Filled 2019-07-31 (×2): qty 2

## 2019-07-31 NOTE — Progress Notes (Signed)
*  PRELIMINARY RESULTS* Echocardiogram 2D Echocardiogram has been performed.  Hannah Zimmerman [name] 07/31/2019, 11:31 AM

## 2019-07-31 NOTE — ED Notes (Signed)
This RN asked pharmacy to tube floor medications to main ed tube station.

## 2019-07-31 NOTE — Progress Notes (Signed)
Talked to Dr. Grandville Silos and notify him about patient's HR to 150's with ambulation. Patient received 25 mg of p.o metoprolol today. Per MD to call cardiology on call, notify primary RN.

## 2019-07-31 NOTE — Progress Notes (Signed)
PROGRESS NOTE    TAMATHA MURTHY  [id number] DOB: Nov 14, 1970 DOA: 07/30/2019 PCP: [name], MD    Brief Narrative:  Patient is a pleasant 48 year old female history of tachycardia, pulmonary hypertension, status post gastric sleeve presented to the ED with a 1 to 2-day history of bilateral lower extremity edema.  Patient noted to be in new onset A. fib with RVR.  CT angiogram chest done negative for PE, however consistent with bronchitis.  Lower extremity Dopplers done negative for DVT.  Patient placed on IV Lasix, oral metoprolol for rate control.  COVID-19 test negative.  Cardiology consulted.   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 Fib with RVR Patient presented with new onset A. fib with heart rates in the 160s.  Questionable etiology.  CT angiogram chest negative for PE however consistent with bronchitis.  Patient with no prior history of A. fib however history of tachycardia was on propranolol as needed.  Patient received a dose of Cardizem 10 mg IV x1 and heart rate was in the 70s in the ED however will transiently go back and forth to the 140s.  TSH and free T4 within normal limits.  2D echo pending.  Cardiac enzymes minimally elevated at 24 however seem flattened and likely secondary to demand.  Patient noted to have a elevated BNP of 918.  Patient placed on oral propranolol 12.5 mg twice daily.  Patient noted to have heart rates ranging from the 90s to the 140s.  2D echo pending.???  Oral Cardizem however blood pressure somewhat borderline.  Cardiology consultation pending.  CHA2DS2-VASc of less than 2.  Continue aspirin.  Follow.  2.  Acute exacerbation of CHF, not currently  specified/right heart failure Likely secondary to new onset A. fib versus history of pulmonary hypertension.  Patient did complain of a 15 pound weight gain over the past week with worsening lower extremity edema.  BNP noted to be at 918.  High-sensitivity troponin is in the 20s however flattened.  2D echo pending.  Patient with borderline blood pressure and as such started on low-dose Lasix 20 mg IV every 12 hours.  Patient just received her first dose this morning.  Urine output pending.  Daily weights pending.  Patient is now on low-dose beta-blocker secondary to problem #1.  Strict I's and O's.  Cardiology consulted.  Follow.  3.  Bronchitis with wheezing Patient noted to have some wheezing this morning on examination.  CT angiogram on admission consistent with bronchitis.  Patient with ongoing tobacco use and tobacco cessation expressed to patient.  COVID-19 negative.  Continue azithromycin.  Give a dose of IV Solu-Medrol, placed on scheduled Xopenex and Atrovent nebs, continue azithromycin.  Follow.  4.  Tobacco abuse Tobacco cessation stressed to patient.  Patient currently does not want a nicotine patch at this time and will let us know if she changes her mind.  5.  Transaminitis Likely secondary to problem #2.  LFTs trending down.  Acute hepatitis panel negative.  Follow.  6.  COVID-19 ruled out. Patient stated that had exposure to somebody recently diagnosed with COVID-19 on 07/29/2019.  COVID-19 negative.  Continue supportive care.    DVT  prophylaxis: Lovenox Code Status: Full Family Communication: Updated patient.  No family at bedside. Disposition Plan: Likely home when clinically improved, A. fib is rate controlled, patient is euvolemic and per cardiology.   Consultants:   Cardiology pending  Procedures:  2D echo pending  Chest x-ray 07/30/2019  CT angiogram chest 07/30/2019  Bilateral lower extremity Dopplers 07/30/2019  Antimicrobials:   IV azithromycin  07/30/2019   Subjective: Patient laying in bed.  Denies any shortness of breath.  States some improvement with lower extremity edema after receiving her first dose of Lasix this morning.  Patient states did not receive any Lasix yesterday evening nor did she get any food.  Denies any chest pain.  Patient noted to have heart rates ranging from the 90s to 140s during my interview in the room.  Patient did not receive any medications in the ED overnight.  Objective: Vitals:   07/31/19 0500 07/31/19 0515 07/31/19 0717 07/31/19 0830  BP:   (!) 126/100   Pulse: 64 (!) 56 (!) 57 100  Resp: 17 17 (!) 24 17  Temp:   97.6 F (36.4 C)   TempSrc:   Oral   SpO2: 92% 93% 94% 95%  Weight:      Height:        Intake/Output Summary (Last 24 hours) at 07/31/2019 0922 Last data filed at 07/31/2019 0908 Gross per 24 hour  Intake 490 ml  Output -  Net 490 ml   Filed Weights   07/30/19 1406  Weight: 99.3 kg    Examination:  General exam: Appears calm and comfortable  Respiratory system: Mild expiratory wheezing.  Fair air movement.  Speaking in full sentences.   Cardiovascular system: Irregularly irregular.  2+ bilateral lower extremity edema.  Gastrointestinal system: Abdomen is nondistended, soft and nontender. No organomegaly or masses felt. Normal bowel sounds heard. Central nervous system: Alert and oriented. No focal neurological deficits. Extremities: 2+ bilateral lower extremity edema.  Skin: No rashes, lesions or ulcers Psychiatry: Judgement and insight appear normal. Mood & affect appropriate.     Data Reviewed: I have personally reviewed following labs and imaging studies  CBC: Recent Labs  Lab 07/30/19 1417 07/31/19 0559  WBC 9.8 7.1  HGB 15.2* 14.1  HCT 44.2 42.0  MCV 94.4 97.9  PLT 307 0000000   Basic Metabolic Panel: Recent Labs  Lab 07/30/19 1417 07/30/19 1424 07/30/19 1835 07/31/19 0559  NA 139  --   --  141  K 4.0  --   --  3.9  CL 107  --   --  112*  CO2  25  --   --  22  GLUCOSE 93  --   --  79  BUN 14  --   --  12  CREATININE 0.63  --   --  0.51  CALCIUM 8.5*  --   --  8.0*  MG  --  2.0 1.8  --    GFR: Estimated Creatinine Clearance: 99.5 mL/min (by C-G formula based on SCr of 0.51 mg/dL). Liver Function Tests: Recent Labs  Lab 07/30/19 1424 07/31/19 0559  AST 74* 51*  ALT 127* 98*  ALKPHOS 69 63  BILITOT 0.8 1.0  PROT 5.6* 4.7*  ALBUMIN 3.3* 2.9*   No results for input(s): LIPASE, AMYLASE in the last 168 hours. No results for input(s): AMMONIA in the last 168 hours. Coagulation Profile: No results for input(s): INR, PROTIME in the last 168 hours. Cardiac Enzymes: No results for input(s): CKTOTAL, CKMB, CKMBINDEX,  TROPONINI in the last 168 hours. BNP (last 3 results) No results for input(s): PROBNP in the last 8760 hours. HbA1C: No results for input(s): HGBA1C in the last 72 hours. CBG: No results for input(s): GLUCAP in the last 168 hours. Lipid Profile: Recent Labs    07/31/19 0559  CHOL 121  HDL 35*  LDLCALC 72  TRIG 68  CHOLHDL 3.5   Thyroid Function Tests: Recent Labs    07/30/19 1424  TSH 2.226  FREET4 1.09   Anemia Panel: No results for input(s): VITAMINB12, FOLATE, FERRITIN, TIBC, IRON, RETICCTPCT in the last 72 hours. Sepsis Labs: No results for input(s): PROCALCITON, LATICACIDVEN in the last 168 hours.  Recent Results (from the past 240 hour(s))  SARS CORONAVIRUS 2 (TAT 6-24 HRS) Nasopharyngeal Nasopharyngeal Swab     Status: None   Collection Time: 07/30/19  5:48 PM   Specimen: Nasopharyngeal Swab  Result Value Ref Range Status   SARS Coronavirus 2 NEGATIVE NEGATIVE Final    Comment: (NOTE) SARS-CoV-2 target nucleic acids are NOT DETECTED. The SARS-CoV-2 RNA is generally detectable in upper and lower respiratory specimens during the acute phase of infection. Negative results do not preclude SARS-CoV-2 infection, do not rule out co-infections with other pathogens, and should not be used as  the sole basis for treatment or other patient management decisions. Negative results must be combined with clinical observations, patient history, and epidemiological information. The expected result is Negative. Fact Sheet for Patients: SugarRoll.be Fact Sheet for Healthcare Providers: https://www.woods-mathews.com/ This test is not yet approved or cleared by the [place] FDA and  has been authorized for detection and/or diagnosis of SARS-CoV-2 by FDA under an Emergency Use Authorization (EUA). This EUA will remain  in effect (meaning this test can be used) for the duration of the COVID-19 declaration under Section 56 4(b)(1) of the Act, 21 U.S.C. section 360bbb-3(b)(1), unless the authorization is terminated or revoked sooner. Performed at [facility], [place] [address] [place], [place] [address]          Radiology Studies: Dg Chest 2 View  Result Date: 07/30/2019 CLINICAL DATA:  New onset atrial fibrillation. Bilateral leg swelling. EXAM: CHEST - 2 VIEW COMPARISON:  December 02, 2014 FINDINGS: The heart size and mediastinal contours are within normal limits. Both lungs are clear. The visualized skeletal structures are unremarkable. IMPRESSION: No active cardiopulmonary disease. Electronically Signed   By: Dorise Bullion III M.D   On: 07/30/2019 15:33   Ct Angio Chest Pe W And/or Wo Contrast  Result Date: 07/30/2019 CLINICAL DATA:  Chest pain, tachycardia, leg swelling intermediate probability of PE EXAM: CT ANGIOGRAPHY CHEST WITH CONTRAST TECHNIQUE: Multidetector CT imaging of the chest was performed using the standard protocol during bolus administration of intravenous contrast. Multiplanar CT image reconstructions and MIPs were obtained to evaluate the vascular anatomy. CONTRAST:  29mL OMNIPAQUE IOHEXOL 350 MG/ML SOLN COMPARISON:  None. FINDINGS: Cardiovascular: Satisfactory opacification of the pulmonary arteries to the  segmental level. No evidence of pulmonary embolism. Cardiomegaly. Enlargement of the main pulmonary artery up to 3.8 cm. No pericardial effusion. Mediastinum/Nodes: No enlarged mediastinal, hilar, or axillary lymph nodes. Thyroid gland, trachea, and esophagus demonstrate no significant findings. Lungs/Pleura: Diffuse bilateral bronchial wall thickening. Mild geographic airspace attenuation throughout the lungs. No pleural effusion or pneumothorax. Upper Abdomen: No acute abnormality. Musculoskeletal: No chest wall abnormality. No acute or significant osseous findings. Review of the MIP images confirms the above findings. IMPRESSION: 1.  Negative examination for pulmonary embolism. 2. Diffuse  bilateral bronchial wall thickening, consistent with nonspecific infectious or inflammatory bronchitis. 3. Mild geographic airspace attenuation throughout the lungs, suggestive of small airways disease and air trapping. 4.  Cardiomegaly. 5. Enlargement of the main pulmonary artery, as can be seen in pulmonary hypertension. Electronically Signed   By: Eddie Candle M.D.   On: 07/30/2019 16:30   US Venous Img Lower Bilateral  Result Date: 07/30/2019 CLINICAL DATA:  Bilateral lower extremity edema since last night, left greater than right. History of smoking. Evaluate for DVT. EXAM: BILATERAL LOWER EXTREMITY VENOUS DOPPLER ULTRASOUND TECHNIQUE: Gray-scale sonography with graded compression, as well as color Doppler and duplex ultrasound were performed to evaluate the lower extremity deep venous systems from the level of the common femoral vein and including the common femoral, femoral, profunda femoral, popliteal and calf veins including the posterior tibial, peroneal and gastrocnemius veins when visible. The superficial great saphenous vein was also interrogated. Spectral Doppler was utilized to evaluate flow at rest and with distal augmentation maneuvers in the common femoral, femoral and popliteal veins. COMPARISON:  None.  FINDINGS: RIGHT LOWER EXTREMITY Common Femoral Vein: No evidence of thrombus. Normal compressibility, respiratory phasicity and response to augmentation. Saphenofemoral Junction: No evidence of thrombus. Normal compressibility and flow on color Doppler imaging. Profunda Femoral Vein: No evidence of thrombus. Normal compressibility and flow on color Doppler imaging. Femoral Vein: No evidence of thrombus. Normal compressibility, respiratory phasicity and response to augmentation. Popliteal Vein: No evidence of thrombus. Normal compressibility, respiratory phasicity and response to augmentation. Calf Veins: No evidence of thrombus. Normal compressibility and flow on color Doppler imaging. Superficial Great Saphenous Vein: No evidence of thrombus. Normal compressibility. Venous Reflux:  None. Other Findings: Pulsatile flow is demonstrated within the majority of the right lower extremity venous system. LEFT LOWER EXTREMITY Common Femoral Vein: No evidence of thrombus. Normal compressibility, respiratory phasicity and response to augmentation. Saphenofemoral Junction: No evidence of thrombus. Normal compressibility and flow on color Doppler imaging. Profunda Femoral Vein: No evidence of thrombus. Normal compressibility and flow on color Doppler imaging. Femoral Vein: No evidence of thrombus. Normal compressibility, respiratory phasicity and response to augmentation. Popliteal Vein: No evidence of thrombus. Normal compressibility, respiratory phasicity and response to augmentation. Calf Veins: No evidence of thrombus. Normal compressibility and flow on color Doppler imaging. Superficial Great Saphenous Vein: No evidence of thrombus. Normal compressibility. Venous Reflux:  None. Other Findings: Pulsatile flow is demonstrated within majority of left lower extremity venous system note is made of an approximately 3.2 x 2.4 x 1.1 cm anechoic left-sided Baker's cyst. IMPRESSION: 1. No evidence DVT within either lower extremity.  2. Pulsatile flow demonstrated within the bilateral lower extremity venous systems, nonspecific though could be seen in the setting of right-sided heart failure. Clinical correlation is advised. 3. Incidentally noted approximately 3.2 cm left-sided Baker's cyst Electronically Signed   By: Sandi Mariscal M.D.   On: 07/30/2019 15:33        Scheduled Meds: . aspirin EC  81 mg Oral Daily  . calcium citrate-vitamin D  1 tablet Oral BID  . cholecalciferol  1,000 Units Oral Daily  . enoxaparin (LOVENOX) injection  40 mg Subcutaneous Q24H  . furosemide  20 mg Intravenous Q12H  . metoprolol tartrate  12.5 mg Oral BID  . multivitamin with minerals  1 tablet Oral BID  . sodium chloride flush  3 mL Intravenous Q12H  . sodium chloride flush  3 mL Intravenous Q12H   Continuous Infusions: . sodium chloride    .  sodium chloride    . azithromycin Stopped (07/31/19 0022)  . magnesium sulfate bolus IVPB 2 g (07/31/19 0900)     LOS: 1 day    Time spent: 40 minutes    Irine Seal, MD [facility]  If 7PM-7AM, please contact night-coverage [email or web address] 07/31/2019, 9:22 AM

## 2019-07-31 NOTE — ED Notes (Signed)
Given toothrush/toothpaste and deodorant.

## 2019-07-31 NOTE — Consult Note (Signed)
Cardiology Consultation:   Patient ID: Hannah Zimmerman; [id number]; 19-Feb-1971   Admit date: 07/30/2019 Date of Consult: 07/31/2019  Primary Care Provider: [name], MD Primary Cardiologist: Rockey Situ   Patient Profile:   Hannah Zimmerman is a 48 y.o. female with a hx of pulmonary hypertension, morbid obesity s/p gastric bypass, tachy-palpitations of uncertain etiology on as needed propranolol, and tobacco abuse who is being seen today for the evaluation of new onset A. fib with RVR and volume overload at the request of Dr. Grandville Silos.  History of Present Illness:   Hannah Zimmerman has a long history of tachypalpitations of uncertain etiology and has been managed on as needed propanolol.  She was admitted to hospital in 2012 with palpitations and noted to have elevated D-dimer with CTA showing no evidence of PE.  Echo showed normal LV systolic function, mild LVH, significant elevated right heart pressures estimated at 60 mmHg and a BNP of 2500.  She was last seen in the office in 09/2017 noting continued intermittent tachypalpitations.  She comes in with a 1 to 1.5 week history of worsening lower extremity swelling and SOB. She noted some tachypalpitations with this, though did not think much of them given her history of palpitations. She reports her weight has gone up by 15 pounds over the past 1 to 1.5 weeks with associated lower extremity swelling and mild orthopnea, though not worse than her baseline.   BP in the ED noted to be soft in the 90s over 60s.  EKG showed A. fib with RVR as detailed below, CXR showed no active cardiopulmonary disease.  Lower extremity venous ultrasound negative for DVT bilaterally with pulsatile flow demonstrated in the bilateral lower extremity venous systems along with a left-sided Baker's cyst.  CTA negative for PE with diffuse bilateral bronchial wall thickening, small airway disease, cardiomegaly, and enlargement of the main pulmonary artery.  Labs showed  initial high-sensitivity troponin of 24 with a delta of 23, BNP 918, potassium 4.0, BUN 14, serum creatinine 0.63, albumin 3.3 trending to 2.9, AST 74 trending to 51, ALT 127 trended to 98, WBC 9.8, Hgb 15.2, PLT 307, magnesium 1.8, COVID-19 negative, TSH normal.  She was given IV diltiazem 10 mg with brief improvement in ventricular rates.  Upon admission she was placed on Lopressor 12.5 mg twice daily for rate control along with IV Lasix 20 mg twice daily volume overload as well as azithromycin, IV steroids, and nebulizers for acute bronchitis.  Currently, notes some improvement in her lower extremity swelling. She continues to note tachy-palpitations and mild SOB. No chest pain.   Past Medical History:  Diagnosis Date  . Complication of anesthesia 20 years ago   spinal with first c section went too high stopped breathing, low bp with 2nd c section, no further issues with anesthesia  . LVH (left ventricular hypertrophy)    with pulmonary hypertension  . Rash    both arms from old bed bug bites, healing  . Tachycardia     Past Surgical History:  Procedure Laterality Date  . ABDOMINAL HYSTERECTOMY    . ANKLE SURGERY     left x 2  . CESAREAN SECTION     x 3  . LAPAROSCOPIC GASTRIC SLEEVE RESECTION WITH HIATAL HERNIA REPAIR  08/17/2015   Procedure: LAPAROSCOPIC GASTRIC SLEEVE RESECTION WITH HIATAL HERNIA REPAIR;  Surgeon: Johnathan Hausen, MD;  Location: [facility];  Service: General;;  . TUBAL LIGATION     x 2  .  UPPER GI ENDOSCOPY  08/17/2015   Procedure: UPPER GI ENDOSCOPY;  Surgeon: Johnathan Hausen, MD;  Location: [facility];  Service: General;;     Home Meds: Prior to Admission medications   Medication Sig Start Date End Date Taking? Authorizing Provider  calcium citrate-vitamin D (CITRACAL+D) 315-200 MG-UNIT tablet Take 1 tablet by mouth 2 (two) times daily.   Yes [provider]  cholecalciferol (VITAMIN D3) 25 MCG (1000 UT) tablet Take 1,000 Units by mouth daily.   Yes [provider]  Multiple Vitamin (MULTIVITAMIN WITH MINERALS) TABS tablet Take 1 tablet by mouth 2 (two) times daily.   Yes [provider]    Inpatient Medications: Scheduled Meds: . aspirin EC  81 mg Oral Daily  . calcium citrate-vitamin D  1 tablet Oral BID  . cholecalciferol  1,000 Units Oral Daily  . enoxaparin (LOVENOX) injection  40 mg Subcutaneous Q24H  . furosemide  20 mg Intravenous Q12H  . ipratropium  0.5 mg Nebulization Q6H  . levalbuterol  0.63 mg Nebulization Q6H  . metoprolol tartrate  12.5 mg Oral BID  . multivitamin with minerals  1 tablet Oral BID  . sodium chloride flush  3 mL Intravenous Q12H  . sodium chloride flush  3 mL Intravenous Q12H   Continuous Infusions: . sodium chloride    . sodium chloride    . azithromycin Stopped (07/31/19 0022)   PRN Meds: sodium chloride, sodium chloride, acetaminophen, ALPRAZolam, ondansetron (ZOFRAN) IV, sodium chloride flush, sodium chloride flush  Allergies:   Allergies  Allergen Reactions  . Codeine Hives       . Hydrocodone Hives    Social History:   Social History   Socioeconomic History  . Marital status: Married    Spouse name: Not on file  . Number of children: Not on file  . Years of education: Not on file  . Highest education level: Not on file  Occupational History  . Not on file  Social Needs  . Financial resource strain: Not on file  . Food insecurity    Worry: Not on file    Inability: Not on file  . Transportation needs    Medical: Not on file    Non-medical: Not on file  Tobacco Use  . Smoking status: Current Every Day Smoker    Packs/day: 0.50    Years: 20.00    Pack years: 10.00    Types: Cigarettes    Last attempt to quit: 05/03/2015    Years since quitting: 4.2  . Smokeless tobacco: Never Used  Substance and Sexual Activity  . Alcohol use: Yes    Alcohol/week: 1.0 standard drinks    Types: 1 Standard drinks or equivalent per week  . Drug use: No  . Sexual activity:  Not on file  Lifestyle  . Physical activity    Days per week: Not on file    Minutes per session: Not on file  . Stress: Not on file  Relationships  . Social Herbalist on phone: Not on file    Gets together: Not on file    Attends religious service: Not on file    Active member of club or organization: Not on file    Attends meetings of clubs or organizations: Not on file    Relationship status: Not on file  . Intimate partner violence    Fear of current or ex partner: Not on file    Emotionally abused: Not on file  Physically abused: Not on file    Forced sexual activity: Not on file  Other Topics Concern  . Not on file  Social History Narrative  . Not on file     Family History:   Family History  Problem Relation Age of Onset  . Hypertension Mother   . Diabetes Mother   . Heart failure Mother     ROS:  Review of Systems  Constitutional: Positive for malaise/fatigue. Negative for chills, diaphoresis, fever and weight loss.  HENT: Negative for congestion.   Eyes: Negative for discharge and redness.  Respiratory: Positive for shortness of breath. Negative for cough, hemoptysis, sputum production and wheezing.   Cardiovascular: Positive for palpitations, orthopnea and leg swelling. Negative for chest pain, claudication and PND.  Gastrointestinal: Negative for abdominal pain, blood in stool, heartburn, melena, nausea and vomiting.  Genitourinary: Negative for hematuria.  Musculoskeletal: Negative for falls and myalgias.  Skin: Negative for rash.  Neurological: Positive for weakness. Negative for dizziness, tingling, tremors, sensory change, speech change, focal weakness and loss of consciousness.  Endo/Heme/Allergies: Does not bruise/bleed easily.  Psychiatric/Behavioral: Negative for substance abuse. The patient is not nervous/anxious.   All other systems reviewed and are negative.     Physical Exam/Data:   Vitals:   07/31/19 0900 07/31/19 0930 07/31/19  0943 07/31/19 1000  BP: 104/76 (!) 120/92  (!) 125/95  Pulse: 84 (!) 131  72  Resp: 15 19  (!) 29  Temp:      TempSrc:      SpO2: 92% 95%  93%  Weight:   99.3 kg   Height:        Intake/Output Summary (Last 24 hours) at 07/31/2019 1108 Last data filed at 07/31/2019 1005 Gross per 24 hour  Intake 540 ml  Output -  Net 540 ml   Filed Weights   07/30/19 1406 07/31/19 0943  Weight: 99.3 kg 99.3 kg   Body mass index is 37.59 kg/m.   Physical Exam: General: Well developed, well nourished, in no acute distress. Head: Normocephalic, atraumatic, sclera non-icteric, no xanthomas, nares without discharge.  Neck: Negative for carotid bruits. JVD not elevated. Lungs: Clear bilaterally to auscultation without wheezes, rales, or rhonchi. Breathing is unlabored. Heart: Tachycardic, irregularly irregular with S1 S2. II/VI systolic murmur at the apex, no rubs, or gallops appreciated. Abdomen: Soft, non-tender, non-distended with normoactive bowel sounds. No hepatomegaly. No rebound/guarding. No obvious abdominal masses. Msk:  Strength and tone appear normal for age. Extremities: No clubbing or cyanosis. 1+ bilateral lower extremity pitting edema. Distal pedal pulses are 2+ and equal bilaterally. Neuro: Alert and oriented X 3. No facial asymmetry. No focal deficit. Moves all extremities spontaneously. Psych:  Responds to questions appropriately with a normal affect.   EKG:  The EKG was personally reviewed and demonstrates: Afib with RVR, 134 bpm, cannot exclude prior septal infarct, nonspecific inferolateral st/t changes Telemetry:  Telemetry was personally reviewed and demonstrates: Afib with intermittent RVR with ventricular rates in the 90s to 130s bpm  Weights: Filed Weights   07/30/19 1406 07/31/19 0943  Weight: 99.3 kg 99.3 kg    Relevant CV Studies: As above. __________  2D echo this admission pending  Laboratory Data:  Chemistry Recent Labs  Lab 07/30/19 1417 07/31/19  0559  NA 139 141  K 4.0 3.9  CL 107 112*  CO2 25 22  GLUCOSE 93 79  BUN 14 12  CREATININE 0.63 0.51  CALCIUM 8.5* 8.0*  GFRNONAA >60 >60  GFRAA >60 >60  ANIONGAP 7 7    Recent Labs  Lab 07/30/19 1424 07/31/19 0559  PROT 5.6* 4.7*  ALBUMIN 3.3* 2.9*  AST 74* 51*  ALT 127* 98*  ALKPHOS 69 63  BILITOT 0.8 1.0   Hematology Recent Labs  Lab 07/30/19 1417 07/31/19 0559  WBC 9.8 7.1  RBC 4.68 4.29  HGB 15.2* 14.1  HCT 44.2 42.0  MCV 94.4 97.9  MCH 32.5 32.9  MCHC 34.4 33.6  RDW 15.5 15.6*  PLT 307 254   Cardiac EnzymesNo results for input(s): TROPONINI in the last 168 hours. No results for input(s): TROPIPOC in the last 168 hours.  BNP Recent Labs  Lab 07/30/19 1426  BNP 918.0*    DDimer No results for input(s): DDIMER in the last 168 hours.  Radiology/Studies:  Dg Chest 2 View  Result Date: 07/30/2019 IMPRESSION: No active cardiopulmonary disease. Electronically Signed   By: Dorise Bullion III M.D   On: 07/30/2019 15:33   Ct Angio Chest Pe W And/or Wo Contrast  Result Date: 07/30/2019 IMPRESSION: 1.  Negative examination for pulmonary embolism. 2. Diffuse bilateral bronchial wall thickening, consistent with nonspecific infectious or inflammatory bronchitis. 3. Mild geographic airspace attenuation throughout the lungs, suggestive of small airways disease and air trapping. 4.  Cardiomegaly. 5. Enlargement of the main pulmonary artery, as can be seen in pulmonary hypertension. Electronically Signed   By: Eddie Candle M.D.   On: 07/30/2019 16:30   US Venous Img Lower Bilateral  Result Date: 07/30/2019 IMPRESSION: 1. No evidence DVT within either lower extremity. 2. Pulsatile flow demonstrated within the bilateral lower extremity venous systems, nonspecific though could be seen in the setting of right-sided heart failure. Clinical correlation is advised. 3. Incidentally noted approximately 3.2 cm left-sided Baker's cyst Electronically Signed   By: Sandi Mariscal M.D.    On: 07/30/2019 15:33    Assessment and Plan:   1.  New onset A. fib with RVR: -Of uncertain duration at this time -Remains in Afib with intermittent RVR into the 130s bpm with movement -Increase Lopressor to 25 mg bid -CHADS2VASc at least 2 (CHF, female) -Preliminary echo with preserved LVSF with moderately dilated left atrium indicating we may need AAT moving forward  -Start Eliquis 5 mg bid -If she proves to have difficult to control ventricular rates with ambulation prior to discharge, she will require TEE-guided DCCV prior to discharge; otherwise, we will plan for DCCV as an outpatient after she has been adequately anticoagulated without interruption for the next 3-4 weeks -Potassium at goal -TSH normal -Discuss outpatient sleep study in follow up  2.  Acute CHF: -Type uncertain -Echo pending -Weight is up 15 pounds over the past 1.5 weeks -Cannot exclude some degree of third spacing from hypoalbuminemia  -IV Lasix 20 mg bid, consider escalation with KCl repletion   3.  Pulmonary hypertension: -Diuresis as above -Echo as above -Outpatient follow up -Smoking cessation advised -Discuss outpatient sleep study in follow up  4.  High-sensitivity troponin: -Initial high-sensitivity troponin 24 with a delta of 23, not consistent with ACS -Likely supply demand ischemia in the setting of A. fib with RVR volume overload -Heparin gtt as above -Echo pending as above -She will need ischemic evaluation which will be determined based on echo results  5.  Transaminitis: -Possibly in setting of hepatic congestion secondary to volume overload -Diuresis as above -Continue to trend  6.  Possible bronchitis: -Antibiotics, steroids, nebs per IM  7.  Morbid obesity status post gastric bypass: -Continued  weight loss is advised  8.  Tobacco abuse: -Complete cessation is recommended   For questions or updates, please contact [place] Please consult [email or web address] for contact  info under Cardiology/STEMI.   Signed, Christell Faith, PA-C [place] Pager: [phone] 07/31/2019, 11:08 AM

## 2019-07-31 NOTE — ED Notes (Signed)
Report given to Anna, RN 

## 2019-07-31 NOTE — Progress Notes (Signed)
Patients HR goes up to 150s - 160s every time she gets up and ambulates. Messaged Dr. Curt Bears nut patient had already got back in the bed and her HR had returned to normal at 88. Dr. Curt Bears suggested that patient had complete bedrest until cardiology sees patient tomorrow.

## 2019-07-31 NOTE — Progress Notes (Signed)
PT Cancellation Note  Patient Details Name: MCKINZLEY SONNENTAG MRN: [id number] DOB: [birth year]   Cancelled Treatment:    Reason Eval/Treat Not Completed: Patient at procedure or test/unavailable(Consult received and chart reviewed.  Patient currently transitioning from ER to floor.  Will hold at this time to allow for admission assessment.  Will re-attempt at later time/date as medically appropriate and available.)   Minnette Merida H. Owens Shark, PT, DPT, NCS 07/31/19, 4:14 PM [phone]

## 2019-08-01 DIAGNOSIS — I509 Heart failure, unspecified: Secondary | ICD-10-CM

## 2019-08-01 LAB — CBC WITH DIFFERENTIAL/PLATELET
Abs Immature Granulocytes: 0.08 10*3/uL — ABNORMAL HIGH (ref 0.00–0.07)
Basophils Absolute: 0.1 10*3/uL (ref 0.0–0.1)
Basophils Relative: 1 %
Eosinophils Absolute: 0 10*3/uL (ref 0.0–0.5)
Eosinophils Relative: 0 %
HCT: 42.2 % (ref 36.0–46.0)
Hemoglobin: 14.6 g/dL (ref 12.0–15.0)
Immature Granulocytes: 1 %
Lymphocytes Relative: 11 %
Lymphs Abs: 1.4 10*3/uL (ref 0.7–4.0)
MCH: 33.7 pg (ref 26.0–34.0)
MCHC: 34.6 g/dL (ref 30.0–36.0)
MCV: 97.5 fL (ref 80.0–100.0)
Monocytes Absolute: 0.9 10*3/uL (ref 0.1–1.0)
Monocytes Relative: 7 %
Neutro Abs: 10.5 10*3/uL — ABNORMAL HIGH (ref 1.7–7.7)
Neutrophils Relative %: 80 %
Platelets: 283 10*3/uL (ref 150–400)
RBC: 4.33 MIL/uL (ref 3.87–5.11)
RDW: 15.2 % (ref 11.5–15.5)
WBC: 12.9 10*3/uL — ABNORMAL HIGH (ref 4.0–10.5)
nRBC: 0 % (ref 0.0–0.2)

## 2019-08-01 LAB — COMPREHENSIVE METABOLIC PANEL
ALT: 86 U/L — ABNORMAL HIGH (ref 0–44)
AST: 30 U/L (ref 15–41)
Albumin: 2.8 g/dL — ABNORMAL LOW (ref 3.5–5.0)
Alkaline Phosphatase: 63 U/L (ref 38–126)
Anion gap: 6 (ref 5–15)
BUN: 9 mg/dL (ref 6–20)
CO2: 29 mmol/L (ref 22–32)
Calcium: 8.6 mg/dL — ABNORMAL LOW (ref 8.9–10.3)
Chloride: 107 mmol/L (ref 98–111)
Creatinine, Ser: 0.5 mg/dL (ref 0.44–1.00)
GFR calc Af Amer: >60 mL/min (ref >60)
GFR calc non Af Amer: >60 mL/min (ref >60)
Glucose, Bld: 94 mg/dL (ref 70–99)
Potassium: 4 mmol/L (ref 3.5–5.1)
Sodium: 142 mmol/L (ref 135–145)
Total Bilirubin: 0.7 mg/dL (ref 0.3–1.2)
Total Protein: 4.9 g/dL — ABNORMAL LOW (ref 6.5–8.1)

## 2019-08-01 LAB — MAGNESIUM: Magnesium: 2 mg/dL (ref 1.7–2.4)

## 2019-08-01 MED ORDER — IPRATROPIUM BROMIDE 0.02 % IN SOLN: 0.5000 mg | Freq: Four times a day (QID) | RESPIRATORY_TRACT | Status: DC | PRN

## 2019-08-01 MED ORDER — GUAIFENESIN ER 600 MG PO TB12
1200.0000 mg | ORAL_TABLET | Freq: Two times a day (BID) | ORAL | Status: DC
  Administered 2019-08-01 – 2019-08-02 (×3): 1200 mg via ORAL
  Filled 2019-08-01 (×3): qty 2

## 2019-08-01 MED ORDER — LEVALBUTEROL HCL 0.63 MG/3ML IN NEBU
0.6300 mg | INHALATION_SOLUTION | Freq: Four times a day (QID) | RESPIRATORY_TRACT | Status: DC | PRN
  Administered 2019-08-01: 0.63 mg via RESPIRATORY_TRACT
  Filled 2019-08-01: qty 3

## 2019-08-01 MED ORDER — AZITHROMYCIN 250 MG PO TABS
500.0000 mg | ORAL_TABLET | Freq: Every day | ORAL | Status: DC
  Administered 2019-08-01: 500 mg via ORAL
  Filled 2019-08-01: qty 2

## 2019-08-01 MED ORDER — PREDNISONE 20 MG PO TABS
40.0000 mg | ORAL_TABLET | Freq: Every day | ORAL | Status: DC
  Administered 2019-08-01 – 2019-08-02 (×2): 40 mg via ORAL
  Filled 2019-08-01 (×2): qty 2

## 2019-08-01 MED ORDER — DIGOXIN 250 MCG PO TABS
0.2500 mg | ORAL_TABLET | Freq: Once | ORAL | Status: AC
  Administered 2019-08-01: 12:00:00 0.25 mg via ORAL
  Filled 2019-08-01: qty 1

## 2019-08-01 MED ORDER — FUROSEMIDE 10 MG/ML IJ SOLN
20.0000 mg | Freq: Three times a day (TID) | INTRAMUSCULAR | Status: DC
  Administered 2019-08-01 – 2019-08-02 (×3): 20 mg via INTRAVENOUS
  Filled 2019-08-01 (×3): qty 2

## 2019-08-01 MED ORDER — POTASSIUM CHLORIDE CRYS ER 10 MEQ PO TBCR
10.0000 meq | EXTENDED_RELEASE_TABLET | Freq: Two times a day (BID) | ORAL | Status: DC
  Administered 2019-08-01 – 2019-08-02 (×3): 10 meq via ORAL
  Filled 2019-08-01 (×3): qty 1

## 2019-08-01 MED ORDER — SODIUM CHLORIDE 0.9 % IV SOLN: 500.0000 mg | INTRAVENOUS | Status: DC

## 2019-08-01 NOTE — Progress Notes (Signed)
PT Cancellation Note  Patient Details Name: Hannah Zimmerman MRN: [id number] DOB: [birth year]   Cancelled Treatment:    Reason Eval/Treat Not Completed: PT screened, no needs identified, will sign off(Per chart review (and confirmed with primary RN), patient indep ambulatory in room and around unit without deficit in balance, strength or safety.  Nursing aware of and monitoring HR response to activity.  No acute PT needs identified at this time.  Will complete order at this time; please re-consult as medically appropriate should needs change.)   Xaine Sansom H. Owens Shark, PT, DPT, NCS 08/01/19, 9:13 AM [phone]

## 2019-08-01 NOTE — Progress Notes (Signed)
Progress Note  Patient Name: Hannah Zimmerman Date of Encounter: 08/01/2019  Primary Cardiologist: [place] in Afib with controlled ventricular response at rest. With ambulation, ventricular rates will go into the 120s bpm. Improved lower extremity swelling and dyspnea. Received IV digoxin 0.25 mg x 2 overnight. Documented UOP of 1.5 L for the past 24 hours with a net - 1.3 L for the admission. Weight 99.3-->98.9 kg. Potassium 4.0. BUN/SCr 12/0.51-->9/0.5. Albumin 2.9-->2.8. AST 51-->30, ALT 98-->86. WBC 7.1-->12.9. HGB 14.6. Magnesium 2.0.   Inpatient Medications    Scheduled Meds: . apixaban  5 mg Oral BID  . aspirin EC  81 mg Oral Daily  . calcium citrate-vitamin D  1 tablet Oral BID  . cholecalciferol  1,000 Units Oral Daily  . furosemide  20 mg Intravenous Q12H  . ipratropium  0.5 mg Nebulization Q6H  . levalbuterol  0.63 mg Nebulization Q6H  . metoprolol tartrate  25 mg Oral BID  . multivitamin with minerals  1 tablet Oral BID  . sodium chloride flush  3 mL Intravenous Q12H  . sodium chloride flush  3 mL Intravenous Q12H   Continuous Infusions: . sodium chloride    . sodium chloride    . azithromycin 500 mg (07/31/19 1837)   PRN Meds: sodium chloride, sodium chloride, acetaminophen, ALPRAZolam, ondansetron (ZOFRAN) IV, sodium chloride flush, sodium chloride flush   Vital Signs    Vitals:   07/31/19 1613 07/31/19 1839 07/31/19 1953 08/01/19 0517  BP: (!) 132/96 (!) 118/99 117/78 (!) 116/92  Pulse: 66 84 71 79  Resp: 19 20 18 18   Temp: 97.8 F (36.6 C)  98.2 F (36.8 C) 98.3 F (36.8 C)  TempSrc: Oral  Oral Oral  SpO2: 98% 97% 96% 97%  Weight: 99.3 kg   98.9 kg  Height: 5\' 4"  (1.626 m)       Intake/Output Summary (Last 24 hours) at 08/01/2019 0723 Last data filed at 08/01/2019 [id number] Gross per 24 hour  Intake 530 ml  Output 2100 ml  Net -1570 ml   Filed Weights   07/31/19 0943 07/31/19 1613 08/01/19 0517  Weight: 99.3 kg 99.3 kg  98.9 kg    Telemetry    Afib with ventricular rates ranging from the 80s to 120s bpm - Personally Reviewed  ECG    No new tracings - Personally Reviewed  Physical Exam   GEN: No acute distress.   Neck: No JVD. Cardiac: Irregularly irregular, no murmurs, rubs, or gallops.  Respiratory: Clear to auscultation bilaterally.  GI: Soft, nontender, non-distended.   MS: Improving lower extremity edema; No deformity. Neuro:  Alert and oriented x 3; Nonfocal.  Psych: Normal affect.  Labs    Chemistry Recent Labs  Lab 07/30/19 1417 07/30/19 1424 07/31/19 0559 08/01/19 0514  NA 139  --  141 142  K 4.0  --  3.9 4.0  CL 107  --  112* 107  CO2 25  --  22 29  GLUCOSE 93  --  79 94  BUN 14  --  12 9  CREATININE 0.63  --  0.51 0.50  CALCIUM 8.5*  --  8.0* 8.6*  PROT  --  5.6* 4.7* 4.9*  ALBUMIN  --  3.3* 2.9* 2.8*  AST  --  74* 51* 30  ALT  --  127* 98* 86*  ALKPHOS  --  69 63 63  BILITOT  --  0.8 1.0 0.7  GFRNONAA >60  --  >60 >  60  GFRAA >60  --  >60 >60  ANIONGAP 7  --  7 6     Hematology Recent Labs  Lab 07/30/19 1417 07/31/19 0559 08/01/19 0514  WBC 9.8 7.1 12.9*  RBC 4.68 4.29 4.33  HGB 15.2* 14.1 14.6  HCT 44.2 42.0 42.2  MCV 94.4 97.9 97.5  MCH 32.5 32.9 33.7  MCHC 34.4 33.6 34.6  RDW 15.5 15.6* 15.2  PLT 307 254 283    Cardiac EnzymesNo results for input(s): TROPONINI in the last 168 hours. No results for input(s): TROPIPOC in the last 168 hours.   BNP Recent Labs  Lab 07/30/19 1426  BNP 918.0*     DDimer No results for input(s): DDIMER in the last 168 hours.   Radiology    Dg Chest 2 View  Result Date: 07/30/2019 IMPRESSION: No active cardiopulmonary disease. Electronically Signed   By: Dorise Bullion III M.D   On: 07/30/2019 15:33   Ct Angio Chest Pe W And/or Wo Contrast  Result Date: 07/30/2019 IMPRESSION: 1.  Negative examination for pulmonary embolism. 2. Diffuse bilateral bronchial wall thickening, consistent with nonspecific  infectious or inflammatory bronchitis. 3. Mild geographic airspace attenuation throughout the lungs, suggestive of small airways disease and air trapping. 4.  Cardiomegaly. 5. Enlargement of the main pulmonary artery, as can be seen in pulmonary hypertension. Electronically Signed   By: Eddie Candle M.D.   On: 07/30/2019 16:30   US Venous Img Lower Bilateral  Result Date: 07/30/2019 IMPRESSION: 1. No evidence DVT within either lower extremity. 2. Pulsatile flow demonstrated within the bilateral lower extremity venous systems, nonspecific though could be seen in the setting of right-sided heart failure. Clinical correlation is advised. 3. Incidentally noted approximately 3.2 cm left-sided Baker's cyst Electronically Signed   By: Sandi Mariscal M.D.   On: 07/30/2019 15:33    Cardiac Studies   2D Echo 07/31/2019: 1. Left ventricular ejection fraction, by visual estimation, is 60 to 65%. The left ventricle has normal function. There is moderately increased left ventricular hypertrophy.  2. Left ventricular diastolic parameters are indeterminate.  3. Global right ventricle has normal systolic function.The right ventricular size is normal. No increase in right ventricular wall thickness.  4. Left atrial size was severely dilated.  5. Right atrial size was mildly dilated  6. Moderately elevated pulmonary artery systolic pressure.  7. Rhythm is atrial fibrillation.  Patient Profile     48 y.o. female with history of pulmonary hypertension, morbid obesity s/p gastric bypass, tachy-palpitations of uncertain etiology on as needed propranolol, and tobacco abuse who is being seen today for the evaluation of new onset A. fib with RVR and volume overload.  Assessment & Plan    1. New onset A. fib with RVR: -Of uncertain duration at this time -Remains in Afib with intermittent RVR into the 120s bpm with movement -At rest, ventricular rates are in the 80s bpm -Received IV digoxin 0.25 mg x 2 overnight  -Discussed with MD, plan for PO digoxin 0.25 mg at noon today -Check digoxin level in the morning -Maintain K+ 4.0 -Continue Lopressor to 25 mg bid, BP precludes further escalation at this time -Would try to avoid CCB in the setting of lower extremity swelling -CHADS2VASc at least 2 (CHF, female) -Echo as above -Continue Eliquis 5 mg bid -If she proves to have difficult to control ventricular rates with ambulation prior to discharge, she will require TEE-guided DCCV prior to discharge; otherwise, we will plan for DCCV as an outpatient  after she has been adequately anticoagulated without interruption for the next 3-4 weeks -Ambulate today -Potassium at goal -TSH normal -Discuss outpatient sleep study in follow up  2.  Acute on chronic HFpEF: -Volume status improving, though still volume up -Echo as above -Increase IV Lasix to 20 mg tid with KCl repletion -Weight is up 15 pounds over the past 1.5 weeks  3.  Pulmonary hypertension: -Increase siuresis as above -Echo as above -Outpatient follow up -Smoking cessation advised -Discuss outpatient sleep study in follow up  4.  High-sensitivity troponin: -Initial high-sensitivity troponin 24 with a delta of 23, not consistent with ACS -Likely supply demand ischemia in the setting of A. fib with RVR volume overload -Heparin gtt as above -Echo pending as above -She will need ischemic evaluation which will be determined based on echo results  5.  Transaminitis: -Possibly in setting of hepatic congestion secondary to volume overload -Diuresis as above -Continue to trend  6.  Possible bronchitis: -Antibiotics, steroids, nebs per IM  7.  Morbid obesity status post gastric bypass: -Continued weight loss is advised  8.  Tobacco abuse: -Complete cessation is recommended  For questions or updates, please contact [place] Please consult [email or web address] for contact info under Cardiology/STEMI.    Signed, Christell Faith, PA-C  [place] Pager: [phone] 08/01/2019, 7:23 AM

## 2019-08-01 NOTE — Progress Notes (Signed)
OT Cancellation Note  Patient Details Name: Hannah Zimmerman MRN: [id number] DOB: Jan 27, 1971   Cancelled Treatment:    Reason Eval/Treat Not Completed: OT screened, no needs identified, will sign off. OT screened, no needs identified, will sign off. (Per chart review (and confirmed with primary RN), patient indep ambulatory in room and around unit without deficit in balance, strength or safety. Nursing aware of and monitoring HR response to activity. No acute OT needs identified at this time. Will complete order at this time; please re-consult as medically appropriate should needs change.  Jeni Salles, MPH, MS, OTR/L ascom [phone] 08/01/19, 9:15 AM

## 2019-08-01 NOTE — Progress Notes (Addendum)
PROGRESS NOTE    Hannah Zimmerman  [id number] DOB: 07-Jul-1971 DOA: 07/30/2019 PCP: [name], MD    Brief Narrative:  Patient is a pleasant 48 year old female history of tachycardia, pulmonary hypertension, status post gastric sleeve presented to the ED with a 1 to 2-day history of bilateral lower extremity edema.  Patient noted to be in new onset A. fib with RVR.  CT angiogram chest done negative for PE, however consistent with bronchitis.  Lower extremity Dopplers done negative for DVT.  Patient placed on IV Lasix, oral metoprolol for rate control.  COVID-19 test negative.  Cardiology consulted.   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 Fib with RVR CHA2DS2VASC score 2 Patient presented with new onset A. fib with heart rates in the 160s.  Questionable etiology.  CT angiogram chest negative for PE however consistent with bronchitis.  Patient with no prior history of A. fib however history of tachycardia was on propranolol as needed.  Patient received a dose of Cardizem 10 mg IV x1 and heart rate was in the 70s in the ED however will transiently go back and forth to the 140s.  TSH and free T4 within normal limits.  2D echo EF of 60 to 65%, moderate LVH, severely dilated left atrium, mildly dilated right atrium.  Cardiac enzymes minimally elevated at 24 however seem flattened and likely secondary to demand.  Patient noted to have a elevated BNP of 918.  Patient noted to have heart rates in the 140s overnight on minimal exertion.  Patient received 2 doses of IV digoxin per cardiology.  Patient currently on metoprolol 25 mg twice daily.  Patient also started on oral digoxin per cardiology.  Patient also  on Eliquis for anticoagulation per cardiology to see patient of probable cardioversion as outpatient after 4 weeks of anticoagulation.    2.  Acute exacerbation of diastolic CHF, Likely secondary to new onset A. fib versus history of pulmonary hypertension.  Patient did complain of a 15 pound weight gain over the past week with worsening lower extremity edema.  BNP noted to be at 918.  High-sensitivity troponin is in the 20s however flattened.  2D echo with EF 60- to 65%, moderate LVH, severely dilated left atrium, mildly dilated right atrium, moderately elevated pulmonary artery systolic pressure. Patient with borderline blood pressure and as such started on low-dose Lasix 20 mg IV every 12 hours.  Urine output of 2.1 L over the past 24 hours.  Patient is -2.8 L during this hospitalization.  Continue IV Lasix, metoprolol, aspirin strict I's and O's.  Daily weights.  Cardiology following and appreciate input and recommendations.   3.  Bronchitis with wheezing Patient noted to have some wheezing the morning of 07/31/2019 on examination.  CT angiogram on admission consistent with bronchitis.  Patient with ongoing tobacco use and tobacco cessation expressed to patient.  COVID-19 negative.  Continue azithromycin.  Patient received a dose of IV Solu-Medrol 07/31/2019.  Will place on prednisone 40 mg daily x4 to 5 days.  Continue scheduled Xopenex and Atrovent nebs.  Add Mucinex twice daily.  Follow.   4.  Tobacco abuse Tobacco cessation stressed to patient.  Patient  currently does not want a nicotine patch at this time and will let us know if she changes her mind.  5.  Transaminitis Likely secondary to problem #2.  LFTs trending down with diuresis.  Acute hepatitis panel negative.  Outpatient follow-up.    6.  COVID-19 ruled out. Patient stated that had exposure to somebody recently diagnosed with COVID-19 on 07/29/2019.  COVID-19 negative.  Continue supportive care.    DVT prophylaxis: Eliquis Code  Status: Full Family Communication: Updated patient.  No family at bedside. Disposition Plan: Likely home when clinically improved, A. fib is rate controlled, patient is euvolemic and per cardiology.   Consultants:   Cardiology: Dr. Rockey Situ 07/31/2019  Procedures:  2D echo pending  Chest x-ray 07/30/2019  CT angiogram chest 07/30/2019  Bilateral lower extremity Dopplers 07/30/2019  Antimicrobials:   IV azithromycin 07/30/2019>>> oral azithromycin 08/01/2019   Subjective: Patient sitting up in bed.  States she is feeling better.  His lower extremity swelling improved.  States diuresing well.  Noted to have tachycardia with minimal exertion with heart rates going into the 140s yesterday evening with minimal exertion.  Patient noted to have received 2 doses of IV digoxin overnight.  Objective: Vitals:   07/31/19 1839 07/31/19 1953 08/01/19 0517 08/01/19 0829  BP: (!) 118/99 117/78 (!) 116/92 106/67  Pulse: 84 71 79 60  Resp: 20 18 18    Temp:  98.2 F (36.8 C) 98.3 F (36.8 C) 97.6 F (36.4 C)  TempSrc:  Oral Oral Oral  SpO2: 97% 96% 97% 97%  Weight:   98.9 kg   Height:        Intake/Output Summary (Last 24 hours) at 08/01/2019 1024 Last data filed at 08/01/2019 0831 Gross per 24 hour  Intake 240 ml  Output 3200 ml  Net -2960 ml   Filed Weights   07/31/19 0943 07/31/19 1613 08/01/19 0517  Weight: 99.3 kg 99.3 kg 98.9 kg    Examination:  General exam: NAD  Respiratory system: Minimal expiratory wheezing.  Fair air movement.  No crackles.    Cardiovascular system: Irregularly irregular.  1-2+ bilateral lower extremity edema.  Gastrointestinal system: Abdomen is soft, nontender, nondistended, positive bowel sounds.  No rebound.  No guarding.  Central nervous system: Alert and oriented. No focal neurological deficits. Extremities: 1-2+ bilateral lower extremity edema.  Skin: No rashes, lesions or ulcers Psychiatry: Judgement and insight appear normal. Mood &  affect appropriate.     Data Reviewed: I have personally reviewed following labs and imaging studies  CBC: Recent Labs  Lab 07/30/19 1417 07/31/19 0559 08/01/19 0514  WBC 9.8 7.1 12.9*  NEUTROABS  --   --  10.5*  HGB 15.2* 14.1 14.6  HCT 44.2 42.0 42.2  MCV 94.4 97.9 97.5  PLT 307 254 [id number]   Basic Metabolic Panel: Recent Labs  Lab 07/30/19 1417 07/30/19 1424 07/30/19 1835 07/31/19 0559 08/01/19 0514  NA 139  --   --  141 142  K 4.0  --   --  3.9 4.0  CL 107  --   --  112* 107  CO2 25  --   --  22 29  GLUCOSE 93  --   --  79 94  BUN 14  --   --  12 9  CREATININE 0.63  --   --  0.51 0.50  CALCIUM 8.5*  --   --  8.0* 8.6*  MG  --  2.0 1.8  --  2.0   GFR: Estimated Creatinine  Clearance: 99.4 mL/min (by C-G formula based on SCr of 0.5 mg/dL). Liver Function Tests: Recent Labs  Lab 07/30/19 1424 07/31/19 0559 08/01/19 0514  AST 74* 51* 30  ALT 127* 98* 86*  ALKPHOS 69 63 63  BILITOT 0.8 1.0 0.7  PROT 5.6* 4.7* 4.9*  ALBUMIN 3.3* 2.9* 2.8*   No results for input(s): LIPASE, AMYLASE in the last 168 hours. No results for input(s): AMMONIA in the last 168 hours. Coagulation Profile: No results for input(s): INR, PROTIME in the last 168 hours. Cardiac Enzymes: No results for input(s): CKTOTAL, CKMB, CKMBINDEX, TROPONINI in the last 168 hours. BNP (last 3 results) No results for input(s): PROBNP in the last 8760 hours. HbA1C: No results for input(s): HGBA1C in the last 72 hours. CBG: No results for input(s): GLUCAP in the last 168 hours. Lipid Profile: Recent Labs    07/31/19 0559  CHOL 121  HDL 35*  LDLCALC 72  TRIG 68  CHOLHDL 3.5   Thyroid Function Tests: Recent Labs    07/30/19 1424  TSH 2.226  FREET4 1.09   Anemia Panel: No results for input(s): VITAMINB12, FOLATE, FERRITIN, TIBC, IRON, RETICCTPCT in the last 72 hours. Sepsis Labs: No results for input(s): PROCALCITON, LATICACIDVEN in the last 168 hours.  Recent Results (from the past 240  hour(s))  SARS CORONAVIRUS 2 (TAT 6-24 HRS) Nasopharyngeal Nasopharyngeal Swab     Status: None   Collection Time: 07/30/19  5:48 PM   Specimen: Nasopharyngeal Swab  Result Value Ref Range Status   SARS Coronavirus 2 NEGATIVE NEGATIVE Final    Comment: (NOTE) SARS-CoV-2 target nucleic acids are NOT DETECTED. The SARS-CoV-2 RNA is generally detectable in upper and lower respiratory specimens during the acute phase of infection. Negative results do not preclude SARS-CoV-2 infection, do not rule out co-infections with other pathogens, and should not be used as the sole basis for treatment or other patient management decisions. Negative results must be combined with clinical observations, patient history, and epidemiological information. The expected result is Negative. Fact Sheet for Patients: SugarRoll.be Fact Sheet for Healthcare Providers: https://www.woods-mathews.com/ This test is not yet approved or cleared by the [place] FDA and  has been authorized for detection and/or diagnosis of SARS-CoV-2 by FDA under an Emergency Use Authorization (EUA). This EUA will remain  in effect (meaning this test can be used) for the duration of the COVID-19 declaration under Section 56 4(b)(1) of the Act, 21 U.S.C. section 360bbb-3(b)(1), unless the authorization is terminated or revoked sooner. Performed at [facility], [place] [address] [place], [place] [address]          Radiology Studies: Dg Chest 2 View  Result Date: 07/30/2019 CLINICAL DATA:  New onset atrial fibrillation. Bilateral leg swelling. EXAM: CHEST - 2 VIEW COMPARISON:  December 02, 2014 FINDINGS: The heart size and mediastinal contours are within normal limits. Both lungs are clear. The visualized skeletal structures are unremarkable. IMPRESSION: No active cardiopulmonary disease. Electronically Signed   By: Dorise Bullion III M.D   On: 07/30/2019 15:33   Ct Angio Chest Pe  W And/or Wo Contrast  Result Date: 07/30/2019 CLINICAL DATA:  Chest pain, tachycardia, leg swelling intermediate probability of PE EXAM: CT ANGIOGRAPHY CHEST WITH CONTRAST TECHNIQUE: Multidetector CT imaging of the chest was performed using the standard protocol during bolus administration of intravenous contrast. Multiplanar CT image reconstructions and MIPs were obtained to evaluate the vascular anatomy. CONTRAST:  12mL OMNIPAQUE IOHEXOL 350 MG/ML SOLN COMPARISON:  None. FINDINGS: Cardiovascular:  Satisfactory opacification of the pulmonary arteries to the segmental level. No evidence of pulmonary embolism. Cardiomegaly. Enlargement of the main pulmonary artery up to 3.8 cm. No pericardial effusion. Mediastinum/Nodes: No enlarged mediastinal, hilar, or axillary lymph nodes. Thyroid gland, trachea, and esophagus demonstrate no significant findings. Lungs/Pleura: Diffuse bilateral bronchial wall thickening. Mild geographic airspace attenuation throughout the lungs. No pleural effusion or pneumothorax. Upper Abdomen: No acute abnormality. Musculoskeletal: No chest wall abnormality. No acute or significant osseous findings. Review of the MIP images confirms the above findings. IMPRESSION: 1.  Negative examination for pulmonary embolism. 2. Diffuse bilateral bronchial wall thickening, consistent with nonspecific infectious or inflammatory bronchitis. 3. Mild geographic airspace attenuation throughout the lungs, suggestive of small airways disease and air trapping. 4.  Cardiomegaly. 5. Enlargement of the main pulmonary artery, as can be seen in pulmonary hypertension. Electronically Signed   By: Eddie Candle M.D.   On: 07/30/2019 16:30   US Venous Img Lower Bilateral  Result Date: 07/30/2019 CLINICAL DATA:  Bilateral lower extremity edema since last night, left greater than right. History of smoking. Evaluate for DVT. EXAM: BILATERAL LOWER EXTREMITY VENOUS DOPPLER ULTRASOUND TECHNIQUE: Gray-scale sonography with  graded compression, as well as color Doppler and duplex ultrasound were performed to evaluate the lower extremity deep venous systems from the level of the common femoral vein and including the common femoral, femoral, profunda femoral, popliteal and calf veins including the posterior tibial, peroneal and gastrocnemius veins when visible. The superficial great saphenous vein was also interrogated. Spectral Doppler was utilized to evaluate flow at rest and with distal augmentation maneuvers in the common femoral, femoral and popliteal veins. COMPARISON:  None. FINDINGS: RIGHT LOWER EXTREMITY Common Femoral Vein: No evidence of thrombus. Normal compressibility, respiratory phasicity and response to augmentation. Saphenofemoral Junction: No evidence of thrombus. Normal compressibility and flow on color Doppler imaging. Profunda Femoral Vein: No evidence of thrombus. Normal compressibility and flow on color Doppler imaging. Femoral Vein: No evidence of thrombus. Normal compressibility, respiratory phasicity and response to augmentation. Popliteal Vein: No evidence of thrombus. Normal compressibility, respiratory phasicity and response to augmentation. Calf Veins: No evidence of thrombus. Normal compressibility and flow on color Doppler imaging. Superficial Great Saphenous Vein: No evidence of thrombus. Normal compressibility. Venous Reflux:  None. Other Findings: Pulsatile flow is demonstrated within the majority of the right lower extremity venous system. LEFT LOWER EXTREMITY Common Femoral Vein: No evidence of thrombus. Normal compressibility, respiratory phasicity and response to augmentation. Saphenofemoral Junction: No evidence of thrombus. Normal compressibility and flow on color Doppler imaging. Profunda Femoral Vein: No evidence of thrombus. Normal compressibility and flow on color Doppler imaging. Femoral Vein: No evidence of thrombus. Normal compressibility, respiratory phasicity and response to augmentation.  Popliteal Vein: No evidence of thrombus. Normal compressibility, respiratory phasicity and response to augmentation. Calf Veins: No evidence of thrombus. Normal compressibility and flow on color Doppler imaging. Superficial Great Saphenous Vein: No evidence of thrombus. Normal compressibility. Venous Reflux:  None. Other Findings: Pulsatile flow is demonstrated within majority of left lower extremity venous system note is made of an approximately 3.2 x 2.4 x 1.1 cm anechoic left-sided Baker's cyst. IMPRESSION: 1. No evidence DVT within either lower extremity. 2. Pulsatile flow demonstrated within the bilateral lower extremity venous systems, nonspecific though could be seen in the setting of right-sided heart failure. Clinical correlation is advised. 3. Incidentally noted approximately 3.2 cm left-sided Baker's cyst Electronically Signed   By: Sandi Mariscal M.D.   On: 07/30/2019 15:33  Scheduled Meds:  apixaban  5 mg Oral BID   aspirin EC  81 mg Oral Daily   calcium citrate-vitamin D  1 tablet Oral BID   cholecalciferol  1,000 Units Oral Daily   digoxin  0.25 mg Oral Once   furosemide  20 mg Intravenous Q8H   ipratropium  0.5 mg Nebulization Q6H   levalbuterol  0.63 mg Nebulization Q6H   metoprolol tartrate  25 mg Oral BID   multivitamin with minerals  1 tablet Oral BID   potassium chloride  10 mEq Oral BID   predniSONE  40 mg Oral QAC breakfast   sodium chloride flush  3 mL Intravenous Q12H   sodium chloride flush  3 mL Intravenous Q12H   Continuous Infusions:  sodium chloride     sodium chloride     azithromycin 500 mg (07/31/19 1837)     LOS: 2 days    Time spent: 40 minutes    Irine Seal, MD [facility]  If 7PM-7AM, please contact night-coverage [email or web address] 08/01/2019, 10:24 AM

## 2019-08-02 DIAGNOSIS — I5033 Acute on chronic diastolic (congestive) heart failure: Secondary | ICD-10-CM

## 2019-08-02 LAB — BASIC METABOLIC PANEL
Anion gap: 8 (ref 5–15)
BUN: 12 mg/dL (ref 6–20)
CO2: 33 mmol/L — ABNORMAL HIGH (ref 22–32)
Calcium: 8.9 mg/dL (ref 8.9–10.3)
Chloride: 103 mmol/L (ref 98–111)
Creatinine, Ser: 0.81 mg/dL (ref 0.44–1.00)
GFR calc Af Amer: >60 mL/min (ref >60)
GFR calc non Af Amer: >60 mL/min (ref >60)
Glucose, Bld: 85 mg/dL (ref 70–99)
Potassium: 3.7 mmol/L (ref 3.5–5.1)
Sodium: 144 mmol/L (ref 135–145)

## 2019-08-02 LAB — CBC
HCT: 42.6 % (ref 36.0–46.0)
Hemoglobin: 14.9 g/dL (ref 12.0–15.0)
MCH: 33.2 pg (ref 26.0–34.0)
MCHC: 35 g/dL (ref 30.0–36.0)
MCV: 94.9 fL (ref 80.0–100.0)
Platelets: 267 10*3/uL (ref 150–400)
RBC: 4.49 MIL/uL (ref 3.87–5.11)
RDW: 15.2 % (ref 11.5–15.5)
WBC: 12.6 10*3/uL — ABNORMAL HIGH (ref 4.0–10.5)
nRBC: 0 % (ref 0.0–0.2)

## 2019-08-02 LAB — DIGOXIN LEVEL: Digoxin Level: 1 ng/mL (ref 0.8–2.0)

## 2019-08-02 MED ORDER — METOPROLOL TARTRATE 25 MG PO TABS: 37.5000 mg | ORAL_TABLET | Freq: Two times a day (BID) | ORAL | 1 refills | Status: DC

## 2019-08-02 MED ORDER — GUAIFENESIN ER 600 MG PO TB12: 1200.0000 mg | ORAL_TABLET | Freq: Two times a day (BID) | ORAL | 0 refills | Status: AC

## 2019-08-02 MED ORDER — AZITHROMYCIN 500 MG PO TABS: 500.0000 mg | ORAL_TABLET | Freq: Every day | ORAL | 0 refills | Status: AC

## 2019-08-02 MED ORDER — LEVALBUTEROL TARTRATE 45 MCG/ACT IN AERO: 2.0000 | INHALATION_SPRAY | RESPIRATORY_TRACT | 1 refills | Status: DC | PRN

## 2019-08-02 MED ORDER — DIGOXIN 125 MCG PO TABS: 0.1250 mg | ORAL_TABLET | Freq: Every day | ORAL | 1 refills | Status: DC

## 2019-08-02 MED ORDER — FUROSEMIDE 40 MG PO TABS: 40.0000 mg | ORAL_TABLET | Freq: Every day | ORAL | 1 refills | Status: DC

## 2019-08-02 MED ORDER — FUROSEMIDE 40 MG PO TABS: 40.0000 mg | ORAL_TABLET | Freq: Every day | ORAL | Status: DC

## 2019-08-02 MED ORDER — DIGOXIN 125 MCG PO TABS
0.1250 mg | ORAL_TABLET | Freq: Every day | ORAL | Status: DC
  Administered 2019-08-02: 0.125 mg via ORAL
  Filled 2019-08-02: qty 1

## 2019-08-02 MED ORDER — POTASSIUM CHLORIDE CRYS ER 20 MEQ PO TBCR
20.0000 meq | EXTENDED_RELEASE_TABLET | Freq: Once | ORAL | Status: AC
  Administered 2019-08-02: 20 meq via ORAL

## 2019-08-02 MED ORDER — PREDNISONE 20 MG PO TABS: 40.0000 mg | ORAL_TABLET | Freq: Every day | ORAL | 0 refills | Status: AC

## 2019-08-02 MED ORDER — POTASSIUM CHLORIDE CRYS ER 20 MEQ PO TBCR: 20.0000 meq | EXTENDED_RELEASE_TABLET | Freq: Every day | ORAL | 1 refills | Status: DC

## 2019-08-02 MED ORDER — POTASSIUM CHLORIDE CRYS ER 20 MEQ PO TBCR
20.0000 meq | EXTENDED_RELEASE_TABLET | Freq: Every day | ORAL | Status: DC
  Filled 2019-08-02: qty 1

## 2019-08-02 MED ORDER — APIXABAN 5 MG PO TABS: 5.0000 mg | ORAL_TABLET | Freq: Two times a day (BID) | ORAL | 1 refills | Status: DC

## 2019-08-02 NOTE — Progress Notes (Addendum)
Progress Note  Patient Name: Hannah Zimmerman Date of Encounter: 08/02/2019  Primary Cardiologist: Rockey Situ  Subjective   Feels great this morning. No chest pain, SOB, or palpitations. Lower extremity swelling has resolved. Has ambulated without symptoms and improved ventricular rates. Documented UOP of 3 L over the past 24 hours with a net - 4.4 L for the admission. Digoxin level of 1.0 this morning. Potassium 4.0-->3.7, BUN/SCr 9/0.5-->12/0.81. Weight 99.3-->98.9-->94.3 kg.   Inpatient Medications    Scheduled Meds: . apixaban  5 mg Oral BID  . aspirin EC  81 mg Oral Daily  . azithromycin  500 mg Oral Daily  . calcium citrate-vitamin D  1 tablet Oral BID  . cholecalciferol  1,000 Units Oral Daily  . furosemide  20 mg Intravenous Q8H  . guaiFENesin  1,200 mg Oral BID  . metoprolol tartrate  25 mg Oral BID  . multivitamin with minerals  1 tablet Oral BID  . potassium chloride  10 mEq Oral BID  . predniSONE  40 mg Oral QAC breakfast  . sodium chloride flush  3 mL Intravenous Q12H  . sodium chloride flush  3 mL Intravenous Q12H   Continuous Infusions: . sodium chloride    . sodium chloride     PRN Meds: sodium chloride, sodium chloride, acetaminophen, ALPRAZolam, ipratropium, levalbuterol, ondansetron (ZOFRAN) IV, sodium chloride flush, sodium chloride flush   Vital Signs    Vitals:   08/01/19 1229 08/01/19 1533 08/01/19 1944 08/02/19 0421  BP:  105/84 117/82 105/81  Pulse: 65 76 61 78  Resp:   20 20  Temp:  (!) 97.5 F (36.4 C) 98.5 F (36.9 C) 97.7 F (36.5 C)  TempSrc:  Oral Oral Oral  SpO2:  99% 95% 96%  Weight:    94.3 kg  Height:        Intake/Output Summary (Last 24 hours) at 08/02/2019 0736 Last data filed at 08/02/2019 0700 Gross per 24 hour  Intake 120 ml  Output 2700 ml  Net -2580 ml   Filed Weights   07/31/19 1613 08/01/19 0517 08/02/19 0421  Weight: 99.3 kg 98.9 kg 94.3 kg    Telemetry    Afib 70s to 80s bpm, rare RVR into the 120s bpm -  Personally Reviewed  ECG    No new tracings - Personally Reviewed  Physical Exam   GEN: No acute distress.   Neck: No JVD. Cardiac: Irregularly irregular, no murmurs, rubs, or gallops.  Respiratory: Clear to auscultation bilaterally.  GI: Soft, nontender, non-distended.   MS: Improving lower extremity edema; No deformity. DP/PT pulses 2+ bilaterally.  Neuro:  Alert and oriented x 3; Nonfocal.  Psych: Normal affect.  Labs    Chemistry Recent Labs  Lab 07/30/19 1424 07/31/19 0559 08/01/19 0514 08/02/19 0454  NA  --  141 142 144  K  --  3.9 4.0 3.7  CL  --  112* 107 103  CO2  --  22 29 33*  GLUCOSE  --  79 94 85  BUN  --  12 9 12   CREATININE  --  0.51 0.50 0.81  CALCIUM  --  8.0* 8.6* 8.9  PROT 5.6* 4.7* 4.9*  --   ALBUMIN 3.3* 2.9* 2.8*  --   AST 74* 51* 30  --   ALT 127* 98* 86*  --   ALKPHOS 69 63 63  --   BILITOT 0.8 1.0 0.7  --   GFRNONAA  --  >60 >60 >60  GFRAA  --  >  60 >60 >60  ANIONGAP  --  7 6 8      Hematology Recent Labs  Lab 07/31/19 0559 08/01/19 0514 08/02/19 0454  WBC 7.1 12.9* 12.6*  RBC 4.29 4.33 4.49  HGB 14.1 14.6 14.9  HCT 42.0 42.2 42.6  MCV 97.9 97.5 94.9  MCH 32.9 33.7 33.2  MCHC 33.6 34.6 35.0  RDW 15.6* 15.2 15.2  PLT 254 283 267    Cardiac EnzymesNo results for input(s): TROPONINI in the last 168 hours. No results for input(s): TROPIPOC in the last 168 hours.   BNP Recent Labs  Lab 07/30/19 1426  BNP 918.0*     DDimer No results for input(s): DDIMER in the last 168 hours.   Radiology    Dg Chest 2 View  Result Date: 07/30/2019 IMPRESSION: No active cardiopulmonary disease. Electronically Signed   By: Dorise Bullion III M.D   On: 07/30/2019 15:33   Ct Angio Chest Pe W And/or Wo Contrast  Result Date: 07/30/2019 IMPRESSION: 1.  Negative examination for pulmonary embolism. 2. Diffuse bilateral bronchial wall thickening, consistent with nonspecific infectious or inflammatory bronchitis. 3. Mild geographic airspace  attenuation throughout the lungs, suggestive of small airways disease and air trapping. 4.  Cardiomegaly. 5. Enlargement of the main pulmonary artery, as can be seen in pulmonary hypertension. Electronically Signed   By: Eddie Candle M.D.   On: 07/30/2019 16:30   US Venous Img Lower Bilateral  Result Date: 07/30/2019 IMPRESSION: 1. No evidence DVT within either lower extremity. 2. Pulsatile flow demonstrated within the bilateral lower extremity venous systems, nonspecific though could be seen in the setting of right-sided heart failure. Clinical correlation is advised. 3. Incidentally noted approximately 3.2 cm left-sided Baker's cyst Electronically Signed   By: Sandi Mariscal M.D.   On: 07/30/2019 15:33    Cardiac Studies   2D Echo 07/31/2019: 1. Left ventricular ejection fraction, by visual estimation, is 60 to 65%. The left ventricle has normal function. There is moderately increased left ventricular hypertrophy.  2. Left ventricular diastolic parameters are indeterminate.  3. Global right ventricle has normal systolic function.The right ventricular size is normal. No increase in right ventricular wall thickness.  4. Left atrial size was severely dilated.  5. Right atrial size was mildly dilated  6. Moderately elevated pulmonary artery systolic pressure.  7. Rhythm is atrial fibrillation.  Patient Profile     48 y.o. female with history of pulmonary hypertension, morbid obesity s/p gastric bypass, tachy-palpitations of uncertain etiology on as needed propranolol, and tobacco abuse who is being seen today for the evaluation of new onset A. fib with RVR and volume overload.  Assessment & Plan    1. New onset A. fib with RVR: -Of uncertain duration at this time -Remains in Afib with improvement in ventricular rates -Received IV digoxin 0.25 mg x 2 overnight on 11/12 followed by PO digoxin 0.25 on 11/13 with a digoxin level of 1.0 this morning -Decrease digoxin to 0.125 mg -Maintain K+ 4.0  -Continue Lopressor to 25 mg bid, BP precludes further escalation at this time -Would try to avoid CCB in the setting of lower extremity swelling -CHADS2VASc at least 2 (CHF, female) -Echo as above -Continue Eliquis 5 mg bid -Ambulate today, if ventricular rates remained well controlled, plan for DCCV as an outpatient after she has been adequately anticoagulated without interruption for the next 3-4 weeks -If she demonstrates poorly controlled ventricular rates with ambulation, she may need to remain admitted for TEE-guided DCCV on Monday,  11/16 -TSH normal -Discuss outpatient sleep study in follow up  2.  Acute on chronic HFpEF: -Volume status improving, though still volume up -Echo as above -Transition from IV Lasix to PO 40 mg daily with KCl repletion -Weight is up 15 pounds over the past 1.5 weeks -Diuresed 11 pounds this admission to date  3.  Pulmonary hypertension: -Increase siuresis as above -Echo as above -Outpatient follow up -Smoking cessation advised -Discuss outpatient sleep study in follow up  4.  High-sensitivity troponin: -Initial high-sensitivity troponin 24 with a delta of 23, not consistent with ACS -Likely supply demand ischemia in the setting of A. fib with RVR volume overload -Heparin gtt as above -Echo pending as above -She will need ischemic evaluation which will be determined based on echo results  5.  Transaminitis: -Possibly in setting of hepatic congestion secondary to volume overload -Diuresis as above -Improving   6.  Possible bronchitis: -Steroids, nebs per IM  7.  Morbid obesity status post gastric bypass: -Continued weight loss is advised  8.  Tobacco abuse: -Complete cessation is recommended   For questions or updates, please contact [place] Please consult [email or web address] for contact info under Cardiology/STEMI.    Signed, Christell Faith, PA-C [facility] Pager: [phone] 08/02/2019, 7:36 AM  I have seen and  examined this patient with Christell Faith.  Agree with above, note added to reflect my findings.  On exam, regular, tachycardic, lungs clear.  Remains in rate controlled atrial fibrillation.  She does have heart rates at times ago into the 120s when she is up and moving around.  I do think that she is stable to return home.  She did have some wide-complex tachycardia which could have been Ashman's phenomenon in atrial fibrillation.  Despite that, she is comfortable and feeling well.  We will arrange follow-up in cardiology clinic for discussion of cardioversion.  Would plan for discharge on digoxin and metoprolol 37.5 mg twice a day.  Will M. Camnitz MD 08/02/2019 1:05 PM

## 2019-08-02 NOTE — Progress Notes (Signed)
[name] to be D/C'd Home per MD order.  Discussed prescriptions and follow up appointments with the patient. Prescriptions given to patient, medication list explained in detail. Pt verbalized understanding.  Allergies as of 08/02/2019      Reactions   Codeine Hives      Hydrocodone Hives      Medication List    TAKE these medications   apixaban 5 MG Tabs tablet Commonly known as: ELIQUIS Take 1 tablet (5 mg total) by mouth 2 (two) times daily.   azithromycin 500 MG tablet Commonly known as: ZITHROMAX Take 1 tablet (500 mg total) by mouth daily for 2 days.   calcium citrate-vitamin D 315-200 MG-UNIT tablet Commonly known as: CITRACAL+D Take 1 tablet by mouth 2 (two) times daily.   cholecalciferol 25 MCG (1000 UT) tablet Commonly known as: VITAMIN D3 Take 1,000 Units by mouth daily.   digoxin 0.125 MG tablet Commonly known as: LANOXIN Take 1 tablet (0.125 mg total) by mouth daily. Start taking on: August 03, 2019   furosemide 40 MG tablet Commonly known as: LASIX Take 1 tablet (40 mg total) by mouth daily. Start taking on: August 03, 2019   guaiFENesin 600 MG 12 hr tablet Commonly known as: MUCINEX Take 2 tablets (1,200 mg total) by mouth 2 (two) times daily for 4 days.   levalbuterol 45 MCG/ACT inhaler Commonly known as: XOPENEX HFA Inhale 2 puffs into the lungs every 4 (four) hours as needed for wheezing. Inhale 2 puffs 3 times daily x4 days, then every 4 hours as needed.   metoprolol tartrate 25 MG tablet Commonly known as: LOPRESSOR Take 1.5 tablets (37.5 mg total) by mouth 2 (two) times daily.   multivitamin with minerals Tabs tablet Take 1 tablet by mouth 2 (two) times daily.   potassium chloride SA 20 MEQ tablet Commonly known as: KLOR-CON Take 1 tablet (20 mEq total) by mouth daily. Start taking on: August 03, 2019   predniSONE 20 MG tablet Commonly known as: DELTASONE Take 2 tablets (40 mg total) by mouth daily before breakfast for 2  days. Start taking on: August 03, 2019       Vitals:   08/02/19 0849 08/02/19 1125  BP: 114/82 98/75  Pulse: 69 68  Resp:    Temp:    SpO2:  95%    Tele box removed and returned. Skin clean, dry and intact without evidence of skin break down, no evidence of skin tears noted. IV catheter discontinued intact. Site without signs and symptoms of complications. Dressing and pressure applied. Pt denies pain at this time. No complaints noted.  An After Visit Summary was printed and given to the patient. Patient escorted via [place], and D/C home via private auto.  Rolley Sims

## 2019-08-02 NOTE — Progress Notes (Signed)
With ambulation around the nurses station patient had heart rate into the 120s and briefly into the 150s bpm.  Upon reaching her room she had a 12 beat run of WCT, cannot exclude Ashman's phenomenon versus NSVT.  We will have EP review strip.

## 2019-08-02 NOTE — Progress Notes (Addendum)
Per CCMD pt had 12 beat run of nonstain vtach, pt asymtomatic, VSS. Christell Faith PA on the floor and notified.Will continue to monitor.

## 2019-08-02 NOTE — Discharge Summary (Signed)
Physician Discharge Summary  RAYNI HINDSMAN [id number] DOB: 02-Mar-1971 DOA: 07/30/2019  PCP: [name], MD  Admit date: 07/30/2019 Discharge date: 08/02/2019  Time spent: 60 minutes  Recommendations for Outpatient Follow-up:  1. Follow-up with [name], MD in 2 weeks.  On follow-up patient's bronchitis will need to be followed up upon. 2. Follow-up with Dr. Rockey Situ in 1 to 2 weeks. 3. Follow up at [facility] heart failure clinic on 08/07/2019 at 2:30 PM.   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 female with medical history significant of tachycardia, pulmonary hypertension, status post gastric sleeve presented to the ED with a 1 to 2-day history of bilateral lower extremity swelling, 3-day history of fatigue in her legs and generalized weakness.  Patient does endorse a 15 pound weight gain over the past week.  Patient also endorsed some lightheadedness.  Patient denied any fever, no chills, no chest pain, no shortness of breath, no change in chronic cough, no melena, no hematemesis, no hematochezia, no diarrhea no constipation.  Patient denied any asymmetric weakness or numbness.  Patient denied any loss of taste or smell.  Patient denied any palpitations.  Patient denied any syncopal episodes.  Patient stated had 5 watery loose stools yesterday and 1 watery loose stool today.  Patient did state that she had been exposed to a Covid positive person  who was her supervisor at work who was recently diagnosed with Covid yesterday 07/29/2019.  Patient denies any recent antibiotic use.  ED Course: Patient seen in the ED and on presentation and noted to have heart rates in the 160s and atrial fibrillation.  Blood pressure on admission was borderline hypotensive of 95/65.  Patient noted to have sats range from 92 to 100% on room air.  Basic metabolic profile obtained was unremarkable.  Magnesium level of 2.  Hepatic function with a AST of 74, ALT of 127 otherwise was within normal limits.  High-sensitivity troponin was elevated at 24.  BNP of 918.  CBC with a hemoglobin of 15.2 otherwise was unremarkable.  TSH of 2.226.  Free T4 1.09.  Chest x-ray negative for any acute cardiopulmonary disease.  CT angiogram chest done negative for PE.  Diffuse bilateral bronchial wall thickening consistent with nonspecific infectious or inflammatory bronchitis.  Mild geographic airspace attenuation throughout the lungs suggestive of small airways disease and air trapping.  Cardiomegaly.  Enlargement of main pulmonary artery as can be seen in pulmonary hypertension.  Lower extremity Dopplers done negative for DVT, pulsatile flow demonstrated within the bilateral lower extremity venous systems nonspecific but could be seen in the setting of right-sided heart failure.  Incidentally noted approximately 3.2 cm left sided Baker's cyst.   EKG with A. fib with RVR heart rate of 134.  Patient given 10 mg IV dose of diltiazem with heart rates in the 80s and going up into the 140s but trending back down into the 80s - 100s.  COVID-19 test ordered and pending.  Hospital Course:  1 new onset A. Fib with RVR CHA2DS2VASC score 2 Patient presented with new onset A. fib with heart rates in the 160s.  Questionable etiology.  CT angiogram chest negative for PE however consistent with bronchitis.  Patient with no prior history of A. fib however history of tachycardia was on propranolol as  needed.  Patient received a dose of Cardizem 10 mg IV x1 and heart rate was in the 70s in the ED however will transiently go back and forth to the 140s.  TSH and free T4 within normal limits.  2D echo EF of 60 to 65%, moderate LVH, severely dilated left atrium, mildly dilated right atrium.  Cardiac enzymes minimally elevated at 24 however seem flattened and likely secondary to demand.  Patient noted to have a elevated BNP of 918.  Patient noted to have heart rates in the 140s early on during the hospitalization on minimal exertion.  Patient received 2 doses of IV digoxin per cardiology some oral digoxin.  Patient also placed on metoprolol 25 mg twice daily.  Patient noted on day of discharge to have heart rates at times going to the 120s when up and moving around however per cardiology/EP patient was stable for return home.  It was noted that patient may have had some wide-complex tachycardia which could have been Ashman's phenomenon in atrial fibrillation.  Despite that patient felt comfortable.  Patient was also started on anticoagulation.  Patient will follow up in the outpatient setting with cardiology for discussion on cardioversion in the outpatient setting after 4 weeks of anticoagulation.  Patient will be discharged home in stable and improved condition.   2.  Acute exacerbation of diastolic CHF, Likely secondary to new onset A. fib versus history of pulmonary hypertension.  Patient did complain of a 15 pound weight gain over the past week with worsening lower extremity edema.  BNP noted to be at 918 on admission.  High-sensitivity troponin is in the 20s however flattened.  2D echo with EF 60- to 65%, moderate LVH, severely dilated left atrium, mildly dilated right atrium, moderately elevated pulmonary artery systolic pressure. Patient with borderline blood pressure and as such started on low-dose Lasix 20 mg IV every 12 hours.   Patient with good urine output  and was -4.360 L during this  hospitalization.  Patient improved clinically.  Patient subsequently transition to oral Lasix which will be discharged home on.  Patient also placed on metoprolol for rate control as well as digoxin.  Patient was followed by cardiology throughout the hospitalization.  Outpatient follow-up with cardiology.    3.  Bronchitis with wheezing Patient noted to have some wheezing the morning of 07/31/2019 on examination.  CT angiogram on admission consistent with bronchitis.  Patient with ongoing tobacco use and tobacco cessation expressed to patient.  COVID-19 negative.    Patient was placed on azithromycin as well as IV Solu-Medrol and subsequently oral prednisone taper.  Patient was placed on Xopenex and Atrovent nebs as well as Mucinex with clinical improvement.  Patient was discharged home on 2 more days of azithromycin, Mucinex, steroid taper.  Outpatient follow-up with PCP.   4.  Tobacco abuse Tobacco cessation stressed to patient.  Patient refused nicotine patch.  Outpatient follow-up.   5.  Transaminitis Likely secondary to problem #2.  LFTs trended down with diuresis.  Acute hepatitis panel was negative.  Outpatient follow-up.    6.  COVID-19 ruled out. Patient stated that had  exposure to somebody recently diagnosed with COVID-19 on 07/29/2019.  COVID-19 negative.  7.  Morbid obesity status post gastric bypass Outpatient follow-up.    Procedures:  2D echo 07/31/2019  Chest x-ray 07/30/2019  CT angiogram chest 07/30/2019  Bilateral lower extremity Dopplers 07/30/2019  Consultations:  Cardiology: Dr. Rockey Situ 07/31/2019  Discharge Exam: Vitals:   08/02/19 0849 08/02/19 1125  BP: 114/82 98/75  Pulse: 69 68  Resp:    Temp:    SpO2:  95%    General: NAD Cardiovascular: RRR Respiratory: Minimal expiratory wheezing.  Discharge Instructions   Discharge Instructions    Amb referral to AFIB Clinic   Complete by: As directed    Diet - low sodium heart healthy    Complete by: As directed    Increase activity slowly   Complete by: As directed      Allergies as of 08/02/2019      Reactions   Codeine Hives      Hydrocodone Hives      Medication List    TAKE these medications   apixaban 5 MG Tabs tablet Commonly known as: ELIQUIS Take 1 tablet (5 mg total) by mouth 2 (two) times daily.   azithromycin 500 MG tablet Commonly known as: ZITHROMAX Take 1 tablet (500 mg total) by mouth daily for 2 days.   calcium citrate-vitamin D 315-200 MG-UNIT tablet Commonly known as: CITRACAL+D Take 1 tablet by mouth 2 (two) times daily.   cholecalciferol 25 MCG (1000 UT) tablet Commonly known as: VITAMIN D3 Take 1,000 Units by mouth daily.   digoxin 0.125 MG tablet Commonly known as: LANOXIN Take 1 tablet (0.125 mg total) by mouth daily. Start taking on: August 03, 2019   furosemide 40 MG tablet Commonly known as: LASIX Take 1 tablet (40 mg total) by mouth daily. Start taking on: August 03, 2019   guaiFENesin 600 MG 12 hr tablet Commonly known as: MUCINEX Take 2 tablets (1,200 mg total) by mouth 2 (two) times daily for 4 days.   levalbuterol 45 MCG/ACT inhaler Commonly known as: XOPENEX HFA Inhale 2 puffs into the lungs every 4 (four) hours as needed for wheezing. Inhale 2 puffs 3 times daily x4 days, then every 4 hours as needed.   metoprolol tartrate 25 MG tablet Commonly known as: LOPRESSOR Take 1.5 tablets (37.5 mg total) by mouth 2 (two) times daily.   multivitamin with minerals Tabs tablet Take 1 tablet by mouth 2 (two) times daily.   potassium chloride SA 20 MEQ tablet Commonly known as: KLOR-CON Take 1 tablet (20 mEq total) by mouth daily. Start taking on: August 03, 2019   predniSONE 20 MG tablet Commonly known as: DELTASONE Take 2 tablets (40 mg total) by mouth daily before breakfast for 2 days. Start taking on: August 03, 2019      Allergies  Allergen Reactions  . Codeine Hives       . Hydrocodone Hives    Follow-up Information    [facility] Follow up on 08/07/2019.   Specialty: Cardiology Why: @ 2:30pm. Please enter through the [place] entrance Contact information: [place] [employer]       Minna Merritts, MD Follow up.   Specialty: Cardiology Why: please schdule patient for TCM within 1-2 weeks.  Contact information: [place] [address] [phone]        [name], MD. Schedule an appointment as  soon as possible for a visit in 2 week(s).   Specialty: Family Medicine Contact information: [address] [place]  [address] [phone]            The results of significant diagnostics from this hospitalization (including imaging, microbiology, ancillary and laboratory) are listed below for reference.    Significant Diagnostic Studies: Dg Chest 2 View  Result Date: 07/30/2019 CLINICAL DATA:  New onset atrial fibrillation. Bilateral leg swelling. EXAM: CHEST - 2 VIEW COMPARISON:  December 02, 2014 FINDINGS: The heart size and mediastinal contours are within normal limits. Both lungs are clear. The visualized skeletal structures are unremarkable. IMPRESSION: No active cardiopulmonary disease. Electronically Signed   By: Dorise Bullion III M.D   On: 07/30/2019 15:33   Ct Angio Chest Pe W And/or Wo Contrast  Result Date: 07/30/2019 CLINICAL DATA:  Chest pain, tachycardia, leg swelling intermediate probability of PE EXAM: CT ANGIOGRAPHY CHEST WITH CONTRAST TECHNIQUE: Multidetector CT imaging of the chest was performed using the standard protocol during bolus administration of intravenous contrast. Multiplanar CT image reconstructions and MIPs were obtained to evaluate the vascular anatomy. CONTRAST:  80mL OMNIPAQUE IOHEXOL 350 MG/ML SOLN COMPARISON:  None. FINDINGS: Cardiovascular: Satisfactory opacification of the pulmonary  arteries to the segmental level. No evidence of pulmonary embolism. Cardiomegaly. Enlargement of the main pulmonary artery up to 3.8 cm. No pericardial effusion. Mediastinum/Nodes: No enlarged mediastinal, hilar, or axillary lymph nodes. Thyroid gland, trachea, and esophagus demonstrate no significant findings. Lungs/Pleura: Diffuse bilateral bronchial wall thickening. Mild geographic airspace attenuation throughout the lungs. No pleural effusion or pneumothorax. Upper Abdomen: No acute abnormality. Musculoskeletal: No chest wall abnormality. No acute or significant osseous findings. Review of the MIP images confirms the above findings. IMPRESSION: 1.  Negative examination for pulmonary embolism. 2. Diffuse bilateral bronchial wall thickening, consistent with nonspecific infectious or inflammatory bronchitis. 3. Mild geographic airspace attenuation throughout the lungs, suggestive of small airways disease and air trapping. 4.  Cardiomegaly. 5. Enlargement of the main pulmonary artery, as can be seen in pulmonary hypertension. Electronically Signed   By: Eddie Candle M.D.   On: 07/30/2019 16:30   US Venous Img Lower Bilateral  Result Date: 07/30/2019 CLINICAL DATA:  Bilateral lower extremity edema since last night, left greater than right. History of smoking. Evaluate for DVT. EXAM: BILATERAL LOWER EXTREMITY VENOUS DOPPLER ULTRASOUND TECHNIQUE: Gray-scale sonography with graded compression, as well as color Doppler and duplex ultrasound were performed to evaluate the lower extremity deep venous systems from the level of the common femoral vein and including the common femoral, femoral, profunda femoral, popliteal and calf veins including the posterior tibial, peroneal and gastrocnemius veins when visible. The superficial great saphenous vein was also interrogated. Spectral Doppler was utilized to evaluate flow at rest and with distal augmentation maneuvers in the common femoral, femoral and popliteal veins.  COMPARISON:  None. FINDINGS: RIGHT LOWER EXTREMITY Common Femoral Vein: No evidence of thrombus. Normal compressibility, respiratory phasicity and response to augmentation. Saphenofemoral Junction: No evidence of thrombus. Normal compressibility and flow on color Doppler imaging. Profunda Femoral Vein: No evidence of thrombus. Normal compressibility and flow on color Doppler imaging. Femoral Vein: No evidence of thrombus. Normal compressibility, respiratory phasicity and response to augmentation. Popliteal Vein: No evidence of thrombus. Normal compressibility, respiratory phasicity and response to augmentation. Calf Veins: No evidence of thrombus. Normal compressibility and flow on color Doppler imaging. Superficial Great Saphenous Vein: No evidence of thrombus. Normal compressibility. Venous Reflux:  None. Other Findings: Pulsatile flow is  demonstrated within the majority of the right lower extremity venous system. LEFT LOWER EXTREMITY Common Femoral Vein: No evidence of thrombus. Normal compressibility, respiratory phasicity and response to augmentation. Saphenofemoral Junction: No evidence of thrombus. Normal compressibility and flow on color Doppler imaging. Profunda Femoral Vein: No evidence of thrombus. Normal compressibility and flow on color Doppler imaging. Femoral Vein: No evidence of thrombus. Normal compressibility, respiratory phasicity and response to augmentation. Popliteal Vein: No evidence of thrombus. Normal compressibility, respiratory phasicity and response to augmentation. Calf Veins: No evidence of thrombus. Normal compressibility and flow on color Doppler imaging. Superficial Great Saphenous Vein: No evidence of thrombus. Normal compressibility. Venous Reflux:  None. Other Findings: Pulsatile flow is demonstrated within majority of left lower extremity venous system note is made of an approximately 3.2 x 2.4 x 1.1 cm anechoic left-sided Baker's cyst. IMPRESSION: 1. No evidence DVT within  either lower extremity. 2. Pulsatile flow demonstrated within the bilateral lower extremity venous systems, nonspecific though could be seen in the setting of right-sided heart failure. Clinical correlation is advised. 3. Incidentally noted approximately 3.2 cm left-sided Baker's cyst Electronically Signed   By: Sandi Mariscal M.D.   On: 07/30/2019 15:33    Microbiology: Recent Results (from the past 240 hour(s))  SARS CORONAVIRUS 2 (TAT 6-24 HRS) Nasopharyngeal Nasopharyngeal Swab     Status: None   Collection Time: 07/30/19  5:48 PM   Specimen: Nasopharyngeal Swab  Result Value Ref Range Status   SARS Coronavirus 2 NEGATIVE NEGATIVE Final    Comment: (NOTE) SARS-CoV-2 target nucleic acids are NOT DETECTED. The SARS-CoV-2 RNA is generally detectable in upper and lower respiratory specimens during the acute phase of infection. Negative results do not preclude SARS-CoV-2 infection, do not rule out co-infections with other pathogens, and should not be used as the sole basis for treatment or other patient management decisions. Negative results must be combined with clinical observations, patient history, and epidemiological information. The expected result is Negative. Fact Sheet for Patients: SugarRoll.be Fact Sheet for Healthcare Providers: https://www.woods-mathews.com/ This test is not yet approved or cleared by the [place] FDA and  has been authorized for detection and/or diagnosis of SARS-CoV-2 by FDA under an Emergency Use Authorization (EUA). This EUA will remain  in effect (meaning this test can be used) for the duration of the COVID-19 declaration under Section 56 4(b)(1) of the Act, 21 U.S.C. section 360bbb-3(b)(1), unless the authorization is terminated or revoked sooner. Performed at [facility], [place] [address] [place], [place] [address]      Labs: Basic Metabolic Panel: Recent Labs  Lab 07/30/19 1417 07/30/19 1424  07/30/19 1835 07/31/19 0559 08/01/19 0514 08/02/19 0454  NA 139  --   --  141 142 144  K 4.0  --   --  3.9 4.0 3.7  CL 107  --   --  112* 107 103  CO2 25  --   --  22 29 33*  GLUCOSE 93  --   --  79 94 85  BUN 14  --   --  12 9 12   CREATININE 0.63  --   --  0.51 0.50 0.81  CALCIUM 8.5*  --   --  8.0* 8.6* 8.9  MG  --  2.0 1.8  --  2.0  --    Liver Function Tests: Recent Labs  Lab 07/30/19 1424 07/31/19 0559 08/01/19 0514  AST 74* 51* 30  ALT 127* 98* 86*  ALKPHOS 69 63 63  BILITOT  0.8 1.0 0.7  PROT 5.6* 4.7* 4.9*  ALBUMIN 3.3* 2.9* 2.8*   No results for input(s): LIPASE, AMYLASE in the last 168 hours. No results for input(s): AMMONIA in the last 168 hours. CBC: Recent Labs  Lab 07/30/19 1417 07/31/19 0559 08/01/19 0514 08/02/19 0454  WBC 9.8 7.1 12.9* 12.6*  NEUTROABS  --   --  10.5*  --   HGB 15.2* 14.1 14.6 14.9  HCT 44.2 42.0 42.2 42.6  MCV 94.4 97.9 97.5 94.9  PLT 307 254 283 267   Cardiac Enzymes: No results for input(s): CKTOTAL, CKMB, CKMBINDEX, TROPONINI in the last 168 hours. BNP: BNP (last 3 results) Recent Labs    07/30/19 1426  BNP 918.0*    ProBNP (last 3 results) No results for input(s): PROBNP in the last 8760 hours.  CBG: No results for input(s): GLUCAP in the last 168 hours.     Signed:  Irine Seal MD.  [facility] 08/02/2019, 2:52 PM

## 2019-08-02 NOTE — Progress Notes (Signed)
Pt ambulated around the nurses stationx3  without difficulty, no shortness of breath, no dizziness, HR remained in the Afib 90-120's highest was 126. Will continue to monitor.

## 2019-08-04 ENCOUNTER — Telehealth: Payer: Self-pay | Admitting: Cardiovascular Disease

## 2019-08-04 NOTE — Telephone Encounter (Signed)
1st TCM attempt. No answer. Left message to call back.

## 2019-08-04 NOTE — Telephone Encounter (Signed)
TCM....  Patient is being discharged   They saw [facility]  They are scheduled to see Angelica Ran on 11/19  They were seen for new onset afib with RVR  They need to be seen within 1-2 weeks  Pt not added to wait list   Please call

## 2019-08-05 NOTE — Telephone Encounter (Signed)
Patient returning call for tcm fu call

## 2019-08-05 NOTE — Telephone Encounter (Signed)
Patient contacted regarding discharge from [facility] on 08/02/19.  Patient understands to follow up with provider 08/07/19 on 08:25 am at [facility]. Patient understands discharge instructions? yes Patient understands medications and regiment? yes Patient understands to bring all medications to this visit? yes

## 2019-08-06 ENCOUNTER — Telehealth: Payer: Self-pay | Admitting: Cardiovascular Disease

## 2019-08-06 NOTE — Telephone Encounter (Signed)
° °  Patient concerned about extreme fatigue she has had since discharging from [facility] and also questions about medications they added at discharge   She wanted to discuss at appt tomorrow with Sharolyn Douglas that was rescheduled .    Please call.

## 2019-08-06 NOTE — Telephone Encounter (Signed)
Spoke with patient and she went to hospital last week and was in afib. They started her on medications and now she is just so tired. Instructed her to start keeping a blood pressure log and to bring that in with her. Encouraged her to increase activity slowly and monitor her symptoms. Advised that medications do have some side effects of fatigue and hopefully that will improve once she has been on it longer. Confirmed her upcoming appointment with provider here in the office and also to call back if her symptoms should persist or worsen. She verbalized understanding of our conversation with no further questions at this time.

## 2019-08-06 NOTE — Patient Instructions (Signed)
How to Take Your Blood Pressure You can take your blood pressure at home with a machine. You may need to check your blood pressure at home:  To check if you have high blood pressure (hypertension).  To check your blood pressure over time.  To make sure your blood pressure medicine is working. Supplies needed: You will need a blood pressure machine, or monitor. You can buy one at a drugstore or online. When choosing one:  Choose one with an arm cuff.  Choose one that wraps around your upper arm. Only one finger should fit between your arm and the cuff.  Do not choose one that measures your blood pressure from your wrist or finger. Your doctor can suggest a monitor. How to prepare Avoid these things for 30 minutes before checking your blood pressure:  Drinking caffeine.  Drinking alcohol.  Eating.  Smoking.  Exercising. Five minutes before checking your blood pressure:  Pee.  Sit in a dining chair. Avoid sitting in a soft couch or armchair.  Be quiet. Do not talk. How to take your blood pressure Follow the instructions that came with your machine. If you have a digital blood pressure monitor, these may be the instructions: 1. Sit up straight. 2. Place your feet on the floor. Do not cross your ankles or legs. 3. Rest your left arm at the level of your heart. You may rest it on a table, desk, or chair. 4. Pull up your shirt sleeve. 5. Wrap the blood pressure cuff around the upper part of your left arm. The cuff should be 1 inch (2.5 cm) above your elbow. It is best to wrap the cuff around bare skin. 6. Fit the cuff snugly around your arm. You should be able to place only one finger between the cuff and your arm. 7. Put the cord inside the groove of your elbow. 8. Press the power button. 9. Sit quietly while the cuff fills with air and loses air. 10. Write down the numbers on the screen. 11. Wait 2-3 minutes and then repeat steps 1-10. What do the numbers mean? Two  numbers make up your blood pressure. The first number is called systolic pressure. The second is called diastolic pressure. An example of a blood pressure reading is "120 over 80" (or 120/80). If you are an adult and do not have a medical condition, use this guide to find out if your blood pressure is normal: Normal  First number: below 120.  Second number: below 80. Elevated  First number: 120-129.  Second number: below 80. Hypertension stage 1  First number: 130-139.  Second number: 80-89. Hypertension stage 2  First number: 140 or above.  Second number: 90 or above. Your blood pressure is above normal even if only the top or bottom number is above normal. Follow these instructions at home:  Check your blood pressure as often as your doctor tells you to.  Take your monitor to your next doctor's appointment. Your doctor will: ? Make sure you are using it correctly. ? Make sure it is working right.  Make sure you understand what your blood pressure numbers should be.  Tell your doctor if your medicines are causing side effects. Contact a doctor if:  Your blood pressure keeps being high. Get help right away if:  Your first blood pressure number is higher than 180.  Your second blood pressure number is higher than 120. This information is not intended to replace advice given to you by your health   care provider. Make sure you discuss any questions you have with your health care provider. Document Released: 08/17/2008 Document Revised: 08/17/2017 Document Reviewed: 02/11/2016 Elsevier Patient Education  2020 [employer].  Blood Pressure Record Sheet To take your blood pressure, you will need a blood pressure machine. You can buy a blood pressure machine (blood pressure monitor) at your clinic, drug store, or online. When choosing one, consider:  An automatic monitor that has an arm cuff.  A cuff that wraps snugly around your upper arm. You should be able to fit only  one finger between your arm and the cuff.  A device that stores blood pressure reading results.  Do not choose a monitor that measures your blood pressure from your wrist or finger. Follow your health care provider's instructions for how to take your blood pressure. To use this form:  Get one reading in the morning (a.m.) before you take any medicines.  Get one reading in the evening (p.m.) before supper.  Take at least 2 readings with each blood pressure check. This makes sure the results are correct. Wait 1-2 minutes between measurements.  Write down the results in the spaces on this form.  Repeat this once a week, or as told by your health care provider.  Make a follow-up appointment with your health care provider to discuss the results. Blood pressure log Date: _______________________  a.m. _____________________(1st reading) _____________________(2nd reading)  p.m. _____________________(1st reading) _____________________(2nd reading) Date: _______________________  a.m. _____________________(1st reading) _____________________(2nd reading)  p.m. _____________________(1st reading) _____________________(2nd reading) Date: _______________________  a.m. _____________________(1st reading) _____________________(2nd reading)  p.m. _____________________(1st reading) _____________________(2nd reading) Date: _______________________  a.m. _____________________(1st reading) _____________________(2nd reading)  p.m. _____________________(1st reading) _____________________(2nd reading) Date: _______________________  a.m. _____________________(1st reading) _____________________(2nd reading)  p.m. _____________________(1st reading) _____________________(2nd reading) This information is not intended to replace advice given to you by your health care provider. Make sure you discuss any questions you have with your health care provider. Document Released: 06/03/2003 Document Revised:  11/02/2017 Document Reviewed: 09/04/2017 Elsevier Patient Education  2020 [employer].

## 2019-08-07 ENCOUNTER — Ambulatory Visit: Payer: 59 | Admitting: Family

## 2019-08-07 ENCOUNTER — Ambulatory Visit: Payer: 59 | Admitting: Nurse Practitioner
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
## 2019-08-11 ENCOUNTER — Encounter: Payer: Self-pay | Admitting: Cardiovascular Disease

## 2019-08-11 ENCOUNTER — Ambulatory Visit (INDEPENDENT_AMBULATORY_CARE_PROVIDER_SITE_OTHER): Payer: 59 | Admitting: Cardiovascular Disease

## 2019-08-11 ENCOUNTER — Other Ambulatory Visit: Payer: Self-pay

## 2019-08-11 VITALS — BP 80/58 | HR 75 | Ht 64.0 in | Wt 199.0 lb

## 2019-08-11 DIAGNOSIS — I5033 Acute on chronic diastolic (congestive) heart failure: Secondary | ICD-10-CM

## 2019-08-11 DIAGNOSIS — I4891 Unspecified atrial fibrillation: Secondary | ICD-10-CM

## 2019-08-11 DIAGNOSIS — I272 Pulmonary hypertension, unspecified: Secondary | ICD-10-CM | POA: Diagnosis not present

## 2019-08-11 DIAGNOSIS — F17200 Nicotine dependence, unspecified, uncomplicated: Secondary | ICD-10-CM

## 2019-08-11 MED ORDER — POTASSIUM CHLORIDE CRYS ER 20 MEQ PO TBCR: 20.0000 meq | EXTENDED_RELEASE_TABLET | Freq: Every day | ORAL | 2 refills | Status: DC

## 2019-08-11 MED ORDER — METOPROLOL TARTRATE 25 MG PO TABS: 25.0000 mg | ORAL_TABLET | Freq: Two times a day (BID) | ORAL | 2 refills | Status: DC

## 2019-08-11 MED ORDER — FUROSEMIDE 40 MG PO TABS: 40.0000 mg | ORAL_TABLET | Freq: Every day | ORAL | 2 refills | Status: DC

## 2019-08-11 MED ORDER — APIXABAN 5 MG PO TABS: 5.0000 mg | ORAL_TABLET | Freq: Two times a day (BID) | ORAL | 2 refills | Status: DC

## 2019-08-11 NOTE — Patient Instructions (Addendum)
Medication Instructions:  Decrease the metoprolol down to 25 mg twice a day  If you need a refill on your cardiac medications before your next appointment, please call your pharmacy.    Lab work: Digoxin level today to [facility]   If you have labs (blood work) drawn today and your tests are completely normal, you will receive your results only by: Marland Kitchen MyChart Message (if you have MyChart) OR . A paper copy in the mail If you have any lab test that is abnormal or we need to change your treatment, we will call you to review the results.   Testing/Procedures: No new testing needed   Follow-Up: At [facility], you and your health needs are our priority.  As part of our continuing mission to provide you with exceptional heart care, we have created designated Provider Care Teams.  These Care Teams include your primary Cardiologist (physician) and Advanced Practice Providers (APPs -  Physician Assistants and Nurse Practitioners) who all work together to provide you with the care you need, when you need it.  . You will need a follow up EKG in 3 weeks on 09/01/19 at 08:30. Arrive at 08:15 am at [facility].   Send [place] a [employer] or call to let us know you have had it done and for further instructions.   Providers on your designated Care Team:   . Murray Hodgkins, NP . Christell Faith, PA-C . Marrianne Mood, PA-C  Any Other Special Instructions Will Be Listed Below (If Applicable).  For educational health videos Log in to : [email or web address] Or : [email or web address], password : [email or web address]

## 2019-08-12 ENCOUNTER — Ambulatory Visit: Payer: 59 | Admitting: Nurse Practitioner

## 2019-08-12 ENCOUNTER — Encounter: Payer: Self-pay | Admitting: Cardiovascular Disease

## 2019-08-12 NOTE — Telephone Encounter (Signed)
Routing to Dr Rockey Situ. Results from [facility] are under the Media Tab.  Digoxin level from 08/12/19 is 0.5.

## 2019-08-12 NOTE — Telephone Encounter (Signed)
increase digoxin up to 0.25 mg daily Would stay on metop 25 BID if no orthostasis or extreme fatigue, Otherwise we might need to drop metoprolol to 12.5 BID

## 2019-08-12 NOTE — Telephone Encounter (Signed)
This encounter was created in error - please disregard.

## 2019-08-13 ENCOUNTER — Other Ambulatory Visit: Payer: Self-pay | Admitting: *Deleted

## 2019-08-13 MED ORDER — DIGOXIN 250 MCG PO TABS: 0.2500 mg | ORAL_TABLET | Freq: Every day | ORAL | 3 refills | Status: DC

## 2019-08-22 ENCOUNTER — Encounter

## 2019-08-22 ENCOUNTER — Ambulatory Visit: Payer: 59 | Admitting: Nurse Practitioner

## 2019-08-22 ENCOUNTER — Ambulatory Visit: Payer: 59 | Attending: Family | Admitting: Family

## 2019-08-22 ENCOUNTER — Other Ambulatory Visit: Payer: Self-pay

## 2019-08-22 ENCOUNTER — Encounter: Payer: Self-pay | Admitting: Family

## 2019-08-22 VITALS — BP 100/69 | HR 71 | Resp 18 | Ht 64.0 in | Wt 199.6 lb

## 2019-08-22 DIAGNOSIS — Z7901 Long term (current) use of anticoagulants: Secondary | ICD-10-CM | POA: Insufficient documentation

## 2019-08-22 DIAGNOSIS — Z8249 Family history of ischemic heart disease and other diseases of the circulatory system: Secondary | ICD-10-CM | POA: Diagnosis not present

## 2019-08-22 DIAGNOSIS — I5032 Chronic diastolic (congestive) heart failure: Secondary | ICD-10-CM | POA: Diagnosis not present

## 2019-08-22 DIAGNOSIS — F17210 Nicotine dependence, cigarettes, uncomplicated: Secondary | ICD-10-CM | POA: Diagnosis not present

## 2019-08-22 DIAGNOSIS — I4891 Unspecified atrial fibrillation: Secondary | ICD-10-CM | POA: Insufficient documentation

## 2019-08-22 DIAGNOSIS — Z9884 Bariatric surgery status: Secondary | ICD-10-CM | POA: Diagnosis not present

## 2019-08-22 DIAGNOSIS — Z720 Tobacco use: Secondary | ICD-10-CM

## 2019-08-22 DIAGNOSIS — I4819 Other persistent atrial fibrillation: Secondary | ICD-10-CM

## 2019-08-22 DIAGNOSIS — F419 Anxiety disorder, unspecified: Secondary | ICD-10-CM | POA: Insufficient documentation

## 2019-08-22 DIAGNOSIS — I272 Pulmonary hypertension, unspecified: Secondary | ICD-10-CM | POA: Diagnosis not present

## 2019-08-22 DIAGNOSIS — Z79899 Other long term (current) drug therapy: Secondary | ICD-10-CM | POA: Diagnosis not present

## 2019-08-22 MED ORDER — FUROSEMIDE 40 MG PO TABS: 40.0000 mg | ORAL_TABLET | Freq: Every day | ORAL | 2 refills | Status: DC

## 2019-08-22 NOTE — Patient Instructions (Signed)
Continue weighing daily and call for an overnight weight gain of > 2 pounds or a weekly weight gain of >5 pounds. 

## 2019-08-22 NOTE — Progress Notes (Signed)
Patient ID: Hannah Zimmerman, female    DOB: [birth year], 48 y.o.   MRN: [id number]  HPI  Hannah Zimmerman is a 48 y/o female with a history of atrial fibrillation, pulmonary HTN, current tobacco use and chronic heart failure.   Echo report from 07/31/2019 reviewed and showed an EF of 60-65% along with moderately elevated PA pressure.   Admitted 07/30/2019 due to new-onset atrial fibrillation. Cardiology consult obtained. CT angiogram was negative for PE. Initially given IV lasix and then transitioned to oral diuretics. Given IV cardizem for a. Fib. Given antibiotics and solu-medrol due to bronchitis/wheezing. Discharged after 3 days.   She presents today for her initial visit with a chief complaint of minimal fatigue upon moderate exertion. She describes this as having been present for several months. She has associated intermittent palpitations, dizziness and anxiety along with this. She denies any difficulty sleeping, abdominal distention, pedal edema, chest pain, shortness of breath or weight gain. She does notice that she has a decreased appetite.   She admits to being anxious because her husband just left her earlier this week. She says that he has a history of schizophrenia and bipolar. She does report a good support system that she can lean on.   Past Medical History:  Diagnosis Date  . Arrhythmia    atrial fibrillation  . CHF (congestive heart failure) ([place])   . Complication of anesthesia 20 years ago   spinal with first c section went too high stopped breathing, low bp with 2nd c section, no further issues with anesthesia  . Heart murmur   . LVH (left ventricular hypertrophy)    with pulmonary hypertension  . Rash    both arms from old bed bug bites, healing  . Tachycardia    Past Surgical History:  Procedure Laterality Date  . ABDOMINAL HYSTERECTOMY    . ANKLE SURGERY     left x 2  . CESAREAN SECTION     x 3  . LAPAROSCOPIC GASTRIC SLEEVE RESECTION WITH HIATAL HERNIA REPAIR   08/17/2015   Procedure: LAPAROSCOPIC GASTRIC SLEEVE RESECTION WITH HIATAL HERNIA REPAIR;  Surgeon: Johnathan Hausen, MD;  Location: [facility];  Service: General;;  . TUBAL LIGATION     x 2  . UPPER GI ENDOSCOPY  08/17/2015   Procedure: UPPER GI ENDOSCOPY;  Surgeon: Johnathan Hausen, MD;  Location: [facility];  Service: General;;   Family History  Problem Relation Age of Onset  . Hypertension Mother   . Diabetes Mother   . Heart failure Mother    Social History   Tobacco Use  . Smoking status: Current Every Day Smoker    Packs/day: 0.50    Years: 20.00    Pack years: 10.00    Types: Cigarettes    Last attempt to quit: 05/03/2015    Years since quitting: 4.3  . Smokeless tobacco: Never Used  Substance Use Topics  . Alcohol use: Yes    Alcohol/week: 1.0 standard drinks    Types: 1 Standard drinks or equivalent per week   Allergies  Allergen Reactions  . Codeine Hives       . Hydrocodone Hives   Prior to Admission medications   Medication Sig Start Date End Date Taking? Authorizing Provider  apixaban (ELIQUIS) 5 MG TABS tablet Take 1 tablet (5 mg total) by mouth 2 (two) times daily. 08/11/19  Yes Minna Merritts, MD  calcium citrate-vitamin D (CITRACAL+D) 315-200 MG-UNIT tablet Take 1 tablet by mouth 2 (two) times daily.  Yes [provider]  cholecalciferol (VITAMIN D3) 25 MCG (1000 UT) tablet Take 1,000 Units by mouth daily.   Yes [provider]  digoxin (LANOXIN) 0.25 MG tablet Take 1 tablet (0.25 mg total) by mouth daily. 08/13/19  Yes Gollan, Kathlene November, MD  furosemide (LASIX) 40 MG tablet Take 1 tablet (40 mg total) by mouth daily. 08/22/19  Yes Darylene Price A, FNP  levalbuterol [facility] HFA) 45 MCG/ACT inhaler Inhale 2 puffs into the lungs every 4 (four) hours as needed for wheezing. Inhale 2 puffs 3 times daily x4 days, then every 4 hours as needed. 08/02/19 08/01/20 Yes Eugenie Filler, MD  metoprolol tartrate (LOPRESSOR) 25 MG tablet Take 1 tablet (25 mg  total) by mouth 2 (two) times daily. 08/11/19  Yes Minna Merritts, MD  Multiple Vitamin (MULTIVITAMIN WITH MINERALS) TABS tablet Take 1 tablet by mouth 2 (two) times daily.   Yes [provider]  potassium chloride SA (KLOR-CON) 20 MEQ tablet Take 1 tablet (20 mEq total) by mouth daily. 08/11/19  Yes Minna Merritts, MD    Review of Systems  Constitutional: Positive for appetite change (decreased) and fatigue.  HENT: Negative for congestion, postnasal drip and sore throat.   Eyes: Negative.   Respiratory: Negative for chest tightness and shortness of breath.   Cardiovascular: Positive for palpitations (at times). Negative for chest pain and leg swelling.  Gastrointestinal: Negative for abdominal distention and abdominal pain.  Endocrine: Negative.   Genitourinary: Negative.   Musculoskeletal: Positive for back pain. Negative for neck pain.  Skin: Negative.   Allergic/Immunologic: Negative.   Neurological: Positive for dizziness and light-headedness.  Hematological: Negative for adenopathy. Does not bruise/bleed easily.  Psychiatric/Behavioral: Negative for dysphoric mood and sleep disturbance. The patient is nervous/anxious.     Vitals:   08/22/19 0855  BP: 100/69  Pulse: 71  Resp: 18  SpO2: 99%  Weight: 199 lb 9.6 oz (90.5 kg)  Height: 5\' 4"  (1.626 m)   Wt Readings from Last 3 Encounters:  08/22/19 199 lb 9.6 oz (90.5 kg)  08/11/19 199 lb (90.3 kg)  08/02/19 207 lb 12.8 oz (94.3 kg)   Lab Results  Component Value Date   CREATININE 0.81 08/02/2019   CREATININE 0.50 08/01/2019   CREATININE 0.51 07/31/2019     Physical Exam Vitals signs and nursing note reviewed.  Constitutional:      Appearance: Normal appearance.  HENT:     Head: Normocephalic and atraumatic.  Neck:     Musculoskeletal: Normal range of motion and neck supple.  Cardiovascular:     Rate and Rhythm: Normal rate. Rhythm irregular.  Pulmonary:     Effort: Pulmonary effort is normal. No  respiratory distress.     Breath sounds: Wheezing (LLL) present. No rales.  Abdominal:     General: There is no distension.     Palpations: Abdomen is soft.     Tenderness: There is no abdominal tenderness.  Musculoskeletal:        General: No tenderness.     Right lower leg: No edema.     Left lower leg: No edema.  Skin:    General: Skin is warm and dry.  Neurological:     General: No focal deficit present.     Mental Status: She is alert and oriented to person, place, and time.  Psychiatric:        Mood and Affect: Mood is anxious. Affect is tearful.        Behavior:  Behavior normal.    Assessment & Plan:  1: Chronic heart failure with preserved ejection fraction- - NYHA class II - euvolemic today - weighing daily and she was reminded to call for an overnight weight gain of >2 pounds or a weekly weight gain of >5 pounds - rarely adds salt to her food; reviewed the importance of reading food labels so that she can closely follow a 2000mg  sodium / day; written dietary information along with a low sodium cookbook was given to patient - BNP 07/30/2019 was 918.0 - she does not take the flu vaccine; good handwashing emphasized  2: Atrial fibrillation- - saw cardiology Rockey Situ) 08/11/2019 - possible cardioversion in the future - BMP from 08/02/2019 reviewed and showed sodium 144, potassium 3.7, creatinine 0.81 and GFR >60  3: Anxiety- - patient is tearful as she says that her husband recently left and she was wondering what she could take to help her deal with her anxiety over the situation - she says that she is planning on getting established with a PCP; explained that any antidepressant would need to come from a PCP - if she's having trouble sleeping she could try melatonin or benadryl but did caution about drowsiness - she says that she does have a good support system  4: Tobacco use- - currently smoking 1/4 ppd of cigarettes - says that her resolution is to completely stop  the first of 2021 - complete cessation discussed for 3 minutes with her  Patient did not bring her medications nor a list. Each medication was verbally reviewed with the patient and she was encouraged to bring the bottles to every visit to confirm accuracy of list.  Return in 2 months or sooner for any questions/problems before then.

## 2019-09-01 ENCOUNTER — Ambulatory Visit
Admission: RE | Admit: 2019-09-01 | Discharge: 2019-09-01 | Disposition: A | Discharge status: Home / Self Care | Payer: 59 | Source: Ambulatory Visit | Attending: Cardiovascular Disease | Admitting: Cardiovascular Disease

## 2019-09-01 ENCOUNTER — Other Ambulatory Visit: Payer: Self-pay

## 2019-09-01 DIAGNOSIS — I4891 Unspecified atrial fibrillation: Secondary | ICD-10-CM

## 2019-09-08 ENCOUNTER — Telehealth: Payer: Self-pay | Admitting: Cardiovascular Disease

## 2019-09-08 ENCOUNTER — Other Ambulatory Visit: Payer: Self-pay | Admitting: *Deleted

## 2019-09-08 DIAGNOSIS — I4891 Unspecified atrial fibrillation: Secondary | ICD-10-CM

## 2019-09-08 NOTE — Telephone Encounter (Signed)
Spoke with patient and reviewed provider instructions to STOP digoxin and schedule cardioversion if interested. Patient is interested in having that done and reviewed that we would need to schedule labs, COVID test, and procedure because it is after hours. Let her know that I would send this to triage for someone to assist with scheduling. She was appreciative for the call with no further questions at this time.

## 2019-09-08 NOTE — Telephone Encounter (Signed)
EKG shows atrial fibrillation Ventricular rate is slow Would stop the digoxin  It is difficult to start antiarrhythmics given slow ventricular rate as we could make this worse Would suggest we arrange cardioversion, possibly even this week if she is available  If she can watch heart rate for [place] and call us with some pulse numbers

## 2019-09-09 NOTE — Telephone Encounter (Addendum)
Spoke with the pt to follow up with her re: Dr. Donivan Scull recommendations for Cardioversion.. pt says she is open to anytime including the quarantine time after her COVID test. Which she verbalized understanding of... will call the pt back with a date and time and the rest of her instructions.   Dr. Donivan Scull next available for cardioversion is 09/24/2019 will forward to him to see if appropriate to wait until then.

## 2019-09-11 NOTE — Telephone Encounter (Signed)
Secure chat sent to Dr. Rockey Situ to advise if he is ok for her to wait until the week after next for her DCCV.

## 2019-09-11 NOTE — Telephone Encounter (Signed)
Secure chat received back from Dr. Rockey Situ- ok to schedule DCCV for week of 09/22/19  To Dr. Donivan Scull nurse arrange when back in clinic next week.

## 2019-09-11 NOTE — Telephone Encounter (Signed)
Patient calling to check on status.

## 2019-09-16 NOTE — Telephone Encounter (Signed)
Spoke with patient and reviewed instructions and information for upcoming procedure. Instructed her to have labs done at the [facility] on Monday then go to the [facility] for her COVID swab between 12:30 PM and 2:30 PM on Monday 09/22/2019.   You are scheduled for a Cardioversion on Wednesday 09/24/19 with Dr. Rockey Situ Please arrive at the [facility] at 06:30 a.m. on the day of your procedure.  DIET INSTRUCTIONS:  Nothing to eat or drink after midnight except your medications with a sip of water.         1) Labs: CBC & BMP to be done on Monday 09/22/2019 at [facility]tr[facility] 2) COVID test to be done on Monday 09/22/2019 between 12:30PM and 2:30PM then you must go home and quarantine until the morning of your procedure on 09/24/2019  3) Medications:  Hold Furosemide (Lasix) on YOU MAY TAKE ALL of your remaining medications with a small amount of water.  4) Must have a responsible person to drive you home.  5) Bring a current list of your medications and current insurance cards.    If you have any questions after you get home, please call the office at [phone]

## 2019-09-16 NOTE — Telephone Encounter (Signed)
Left voicemail message for patient to call back.

## 2019-09-16 NOTE — Telephone Encounter (Signed)
Spoke with patient and reviewed cardioversion and dates that are available. She was agreeable and told her I would get everything scheduled and give her a call back with updated instructions.

## 2019-09-22 ENCOUNTER — Other Ambulatory Visit
Admission: RE | Admit: 2019-09-22 | Discharge: 2019-09-22 | Disposition: A | Discharge status: Home / Self Care | Payer: Managed Care, Other (non HMO) | Source: Ambulatory Visit | Attending: Cardiovascular Disease | Admitting: Cardiovascular Disease

## 2019-09-22 DIAGNOSIS — I4891 Unspecified atrial fibrillation: Secondary | ICD-10-CM

## 2019-09-22 DIAGNOSIS — Z01812 Encounter for preprocedural laboratory examination: Secondary | ICD-10-CM | POA: Diagnosis present

## 2019-09-22 DIAGNOSIS — Z20822 Contact with and (suspected) exposure to covid-19: Secondary | ICD-10-CM | POA: Diagnosis not present

## 2019-09-22 LAB — BASIC METABOLIC PANEL
Anion gap: 9 (ref 5–15)
BUN: 17 mg/dL (ref 6–20)
CO2: 27 mmol/L (ref 22–32)
Calcium: 8.7 mg/dL — ABNORMAL LOW (ref 8.9–10.3)
Chloride: 103 mmol/L (ref 98–111)
Creatinine, Ser: 0.79 mg/dL (ref 0.44–1.00)
GFR calc Af Amer: >60 mL/min (ref >60)
GFR calc non Af Amer: >60 mL/min (ref >60)
Glucose, Bld: 95 mg/dL (ref 70–99)
Potassium: 4.1 mmol/L (ref 3.5–5.1)
Sodium: 139 mmol/L (ref 135–145)

## 2019-09-22 LAB — CBC
HCT: 42.9 % (ref 36.0–46.0)
Hemoglobin: 15.1 g/dL — ABNORMAL HIGH (ref 12.0–15.0)
MCH: 32.8 pg (ref 26.0–34.0)
MCHC: 35.2 g/dL (ref 30.0–36.0)
MCV: 93.3 fL (ref 80.0–100.0)
Platelets: 218 10*3/uL (ref 150–400)
RBC: 4.6 MIL/uL (ref 3.87–5.11)
RDW: 14.7 % (ref 11.5–15.5)
WBC: 8.6 10*3/uL (ref 4.0–10.5)
nRBC: 0 % (ref 0.0–0.2)

## 2019-09-22 LAB — SARS CORONAVIRUS 2 (TAT 6-24 HRS): SARS Coronavirus 2: NEGATIVE

## 2019-09-23 ENCOUNTER — Other Ambulatory Visit: Payer: Self-pay | Admitting: Cardiovascular Disease

## 2019-09-24 ENCOUNTER — Other Ambulatory Visit: Payer: Self-pay

## 2019-09-24 ENCOUNTER — Encounter: Admission: RE | Payer: Self-pay | Source: Home / Self Care

## 2019-09-24 ENCOUNTER — Ambulatory Visit: Payer: Managed Care, Other (non HMO) | Admitting: Anesthesiology

## 2019-09-24 ENCOUNTER — Encounter: Payer: Self-pay | Admitting: Cardiovascular Disease

## 2019-09-24 ENCOUNTER — Ambulatory Visit
Admission: RE | Admit: 2019-09-24 | Discharge: 2019-09-24 | Disposition: A | Discharge status: Home / Self Care | Payer: Managed Care, Other (non HMO) | Attending: Cardiovascular Disease | Admitting: Cardiovascular Disease

## 2019-09-24 ENCOUNTER — Encounter
Admission: RE | Disposition: A | Discharge status: Home / Self Care | Payer: Self-pay | Source: Home / Self Care | Attending: Cardiovascular Disease

## 2019-09-24 DIAGNOSIS — Z79899 Other long term (current) drug therapy: Secondary | ICD-10-CM | POA: Diagnosis not present

## 2019-09-24 DIAGNOSIS — I272 Pulmonary hypertension, unspecified: Secondary | ICD-10-CM | POA: Insufficient documentation

## 2019-09-24 DIAGNOSIS — I509 Heart failure, unspecified: Secondary | ICD-10-CM | POA: Insufficient documentation

## 2019-09-24 DIAGNOSIS — F419 Anxiety disorder, unspecified: Secondary | ICD-10-CM | POA: Diagnosis not present

## 2019-09-24 DIAGNOSIS — Z7901 Long term (current) use of anticoagulants: Secondary | ICD-10-CM | POA: Diagnosis not present

## 2019-09-24 DIAGNOSIS — Z6834 Body mass index (BMI) 34.0-34.9, adult: Secondary | ICD-10-CM | POA: Diagnosis not present

## 2019-09-24 DIAGNOSIS — Z9884 Bariatric surgery status: Secondary | ICD-10-CM | POA: Insufficient documentation

## 2019-09-24 DIAGNOSIS — F17210 Nicotine dependence, cigarettes, uncomplicated: Secondary | ICD-10-CM | POA: Insufficient documentation

## 2019-09-24 DIAGNOSIS — I4819 Other persistent atrial fibrillation: Secondary | ICD-10-CM | POA: Insufficient documentation

## 2019-09-24 DIAGNOSIS — E669 Obesity, unspecified: Secondary | ICD-10-CM | POA: Diagnosis not present

## 2019-09-24 HISTORY — PX: CARDIOVERSION: SHX1299

## 2019-09-24 SURGERY — CARDIOVERSION
Anesthesia: General

## 2019-09-24 MED ORDER — PROPOFOL 10 MG/ML IV BOLUS
INTRAVENOUS | Status: AC
  Filled 2019-09-24: qty 40

## 2019-09-24 MED ORDER — SODIUM CHLORIDE 0.9 % IV SOLN: INTRAVENOUS | Status: DC | PRN

## 2019-09-24 MED ORDER — PROPOFOL 10 MG/ML IV BOLUS
INTRAVENOUS | Status: DC | PRN
  Administered 2019-09-24: 60 mg via INTRAVENOUS
  Administered 2019-09-24: 30 mg via INTRAVENOUS

## 2019-09-24 NOTE — Transfer of Care (Signed)
Immediate Anesthesia Transfer of Care Note  Patient: Hannah Zimmerman  Procedure(s) Performed: CARDIOVERSION (N/A )  Patient Location: PACU  Anesthesia Type:General  Level of Consciousness: drowsy and patient cooperative  Airway & Oxygen Therapy: Patient Spontanous Breathing and Patient connected to nasal cannula oxygen  Post-op Assessment: Report given to RN and Post -op Vital signs reviewed and stable  Post vital signs: Reviewed and stable  Last Vitals:  Vitals Value Taken Time  BP 78/55 09/24/19 0750  Temp    Pulse 45 09/24/19 0751  Resp 22 09/24/19 0751  SpO2 97 % 09/24/19 0751  Vitals shown include unvalidated device data.  Last Pain:  Vitals:   09/24/19 0704  PainSc: 0-No pain         Complications: No apparent anesthesia complications

## 2019-09-24 NOTE — CV Procedure (Signed)
Cardioversion procedure note For atrial fibrillation, persistent  Procedure Details:  Consent: Risks of procedure as well as the alternatives and risks of each were explained to the (patient/caregiver).  Consent for procedure obtained.  Time Out: Verified patient identification, verified procedure, site/side was marked, verified correct patient position, special equipment/implants available, medications/allergies/relevent history reviewed, required imaging and test results available.  Performed  Patient placed on cardiac monitor, pulse oximetry, supplemental oxygen as necessary.   Sedation given: propofol IV, Dr. Karenz Pacer pads placed anterior and posterior chest.   Cardioverted 1 time(s).   Cardioverted at  150 J. Synchronized biphasic Converted to NSR   Evaluation: Findings: Post procedure EKG shows: NSR Complications: None Patient did tolerate procedure well.  Time Spent Directly with the Patient:  45 minutes   Tim Normal Recinos, M.D., Ph.D.  

## 2019-09-24 NOTE — H&P (Signed)
H&P Addendum, pre-cardioversion  Patient was seen and evaluated prior to -cardioversion procedure Symptoms, prior testing details again confirmed with the patient Patient examined, no significant change from prior exam Lab work reviewed in detail personally by myself Patient understands risk and benefit of the procedure, willing to proceed  Signed, Tim Yoltzin Barg, MD, Ph.D [facility]  

## 2019-09-24 NOTE — Anesthesia Preprocedure Evaluation (Signed)
Anesthesia Evaluation  Patient identified by MRN, date of birth, ID band Patient awake    Reviewed: Allergy & Precautions, H&P , NPO status , Patient's Chart, lab work & pertinent test results, reviewed documented beta blocker date and time   History of Anesthesia Complications (+) PROLONGED EMERGENCE and history of anesthetic complications  Airway Mallampati: III  TM Distance: >3 FB Neck ROM: full    Dental  (+) Dental Advidsory Given, Poor Dentition, Missing   Pulmonary neg shortness of breath, neg COPD, neg recent URI, Current Smoker and Patient abstained from smoking.,    Pulmonary exam normal        Cardiovascular Exercise Tolerance: Good (-) angina+CHF  (-) CAD, (-) Past MI, (-) Cardiac Stents and (-) CABG + dysrhythmias Atrial Fibrillation + Valvular Problems/Murmurs      Neuro/Psych negative neurological ROS  negative psych ROS   GI/Hepatic negative GI ROS, Neg liver ROS,   Endo/Other  negative endocrine ROS  Renal/GU negative Renal ROS  negative genitourinary   Musculoskeletal   Abdominal   Peds  Hematology negative hematology ROS (+)   Anesthesia Other Findings Past Medical History: No date: Arrhythmia     Comment:  atrial fibrillation No date: CHF (congestive heart failure) (HCC) 20 years ago: Complication of anesthesia     Comment:  spinal with first c section went too high stopped               breathing, low bp with 2nd c section, no further issues               with anesthesia No date: Heart murmur No date: LVH (left ventricular hypertrophy)     Comment:  with pulmonary hypertension No date: Rash     Comment:  both arms from old bed bug bites, healing No date: Tachycardia   Reproductive/Obstetrics negative OB ROS                             Anesthesia Physical Anesthesia Plan  ASA: II  Anesthesia Plan: General   Post-op Pain Management:    Induction:  Intravenous  PONV Risk Score and Plan: 2 and Propofol infusion and TIVA  Airway Management Planned: Natural Airway and Nasal Cannula  Additional Equipment:   Intra-op Plan:   Post-operative Plan:   Informed Consent: I have reviewed the patients History and Physical, chart, labs and discussed the procedure including the risks, benefits and alternatives for the proposed anesthesia with the patient or authorized representative who has indicated his/her understanding and acceptance.     Dental Advisory Given  Plan Discussed with: Anesthesiologist, CRNA and Surgeon  Anesthesia Plan Comments:         Anesthesia Quick Evaluation

## 2019-09-24 NOTE — Anesthesia Postprocedure Evaluation (Signed)
Anesthesia Post Note  Patient: Hannah Zimmerman  Procedure(s) Performed: CARDIOVERSION (N/A )  Patient location during evaluation: PACU Anesthesia Type: General Level of consciousness: awake and alert Pain management: pain level controlled Vital Signs Assessment: post-procedure vital signs reviewed and stable Respiratory status: spontaneous breathing, nonlabored ventilation, respiratory function stable and patient connected to nasal cannula oxygen Cardiovascular status: blood pressure returned to baseline and stable Postop Assessment: no apparent nausea or vomiting Anesthetic complications: no     Last Vitals:  Vitals:   09/24/19 0830 09/24/19 0845  BP: 93/72 98/63  Pulse: (!) 34 (!) 49  Resp: 14 15  Temp:    SpO2: 99% 99%    Last Pain:  Vitals:   09/24/19 0845  PainSc: 0-No pain                 Martha Clan

## 2019-09-26 ENCOUNTER — Ambulatory Visit
Admission: RE | Admit: 2019-09-26 | Payer: Managed Care, Other (non HMO) | Source: Home / Self Care | Admitting: Cardiovascular Disease

## 2019-09-30 ENCOUNTER — Telehealth: Payer: Self-pay | Admitting: Cardiovascular Disease

## 2019-09-30 NOTE — Telephone Encounter (Signed)
Patient c/o Palpitations:  High priority if patient c/o lightheadedness, shortness of breath, or chest pain  1) How long have you had palpitations/irregular HR/ Afib? Are you having the symptoms now? yes  2) Are you currently experiencing lightheadedness, SOB or CP? No SOB   3) Do you have a history of afib (atrial fibrillation) or irregular heart rhythm? yes  4) Have you checked your BP or HR? (document readings if available): HR has been fluctuating from 133-53  5) Are you experiencing any other symptoms? Headache won't go away, swelling and weight gain is back - just had cardioversion recently

## 2019-09-30 NOTE — Telephone Encounter (Signed)
Spoke with patient and she reports that she is back in afib. Reports heart rates from 138 then down to 51 with increased fluid, headache, and blood pressure was 118/81. Scheduled her to come in this week to see APP and she was agreeable with this plan. She was appreciative for taking the call with no further questions for now.

## 2019-10-01 NOTE — Progress Notes (Signed)
Office Visit    Patient Name: Hannah Zimmerman Date of Encounter: 10/02/2019  Primary Care Provider:  Patient, No Pcp Per Primary Cardiologist:  [name], MD  Chief Complaint    49 year old female with a history of morbid obesity status post gastric bypass in 2016, tobacco abuse, and recent diagnosis of HFpEF and persistent atrial fibrillation, who presents for follow-up after recent cardioversion.  Past Medical History    Past Medical History:  Diagnosis Date  . (HFpEF) heart failure with preserved ejection fraction ([place])    a. 07/2019 Echo: EF 60-65%, mod LVH. Sev dil LA, mildly dil RA. Mod elev PASP.  Marland Kitchen Complication of anesthesia 20 years ago   a. spinal with first c section went too high stopped breathing, low bp with 2nd c section, no further issues with anesthesia  . Heart murmur    a. 07/2019 Echo: no significant valvular dzs.  Marland Kitchen LVH (left ventricular hypertrophy)    a. 07/2019 Echo: Mod LVH.  . Morbid obesity ([place])    a. 09/2014 s/p gastric bypass.  . Persistent atrial fibrillation ([place])    a. Dx 07/2019-->CHA2DS2VASc = 2 (diast CHF/Fem)-->Eliquis; b. 09/2018 s/p DCCV (150J (biphasic) x 1).  . Rash    both arms from old bed bug bites, healing  . Tobacco abuse    Past Surgical History:  Procedure Laterality Date  . ABDOMINAL HYSTERECTOMY    . ANKLE SURGERY     left x 2  . CARDIOVERSION N/A 09/24/2019   Procedure: CARDIOVERSION;  Surgeon: Minna Merritts, MD;  Location: [facility] ORS;  Service: Cardiovascular;  Laterality: N/A;  . CESAREAN SECTION     x 3  . LAPAROSCOPIC GASTRIC SLEEVE RESECTION WITH HIATAL HERNIA REPAIR  08/17/2015   Procedure: LAPAROSCOPIC GASTRIC SLEEVE RESECTION WITH HIATAL HERNIA REPAIR;  Surgeon: Johnathan Hausen, MD;  Location: [facility] ORS;  Service: General;;  . TUBAL LIGATION     x 2  . UPPER GI ENDOSCOPY  08/17/2015   Procedure: UPPER GI ENDOSCOPY;  Surgeon: Johnathan Hausen, MD;  Location: [facility] ORS;  Service: General;;     Allergies  Allergies  Allergen Reactions  . Codeine Hives       . Hydrocodone Hives    History of Present Illness    49 year old female with the above past medical history including morbid obesity status post gastric bypass in 2016, and ongoing tobacco abuse.  In November 2020, she presented to [facility] with a 2-week history of increasing leg swelling and 15 pound weight gain.  She was found to be in atrial fibrillation.  Echocardiogram showed normal LV function with moderate LVH and a severely dilated left atrium.  Her pulmonary artery systolic pressure was also moderately elevated.  Following aggressive diuresis down to 200 pounds, she was discharged on Lasix, digoxin, metoprolol, and Eliquis.  She was stable at follow-up on November 23 with a plan for continued anticoagulation followed by cardioversion.  Cardioversion was successfully carried out on January 6.    Unfortunately, she believes that she only held sinus rhythm for 1 day, as she started noticing increases in heart rates into the 130s by January 7 and over the past week, she has put back on 10 pounds.  With this, she has had increased lower extremity swelling.  She has been doubling her Lasix the past few days.  She is in atrial fibrillation today at a rate of 76.  She denies chest pain, PND, orthopnea, dizziness, syncope, or early satiety.  Home  Medications    Prior to Admission medications   Medication Sig Start Date End Date Taking? Authorizing Provider  apixaban (ELIQUIS) 5 MG TABS tablet Take 1 tablet (5 mg total) by mouth 2 (two) times daily. 08/11/19   Minna Merritts, MD  calcium citrate-vitamin D (CITRACAL+D) 315-200 MG-UNIT tablet Take 1 tablet by mouth 2 (two) times daily.    [provider]  cholecalciferol (VITAMIN D3) 25 MCG (1000 UT) tablet Take 1,000 Units by mouth daily.    [provider]  furosemide (LASIX) 40 MG tablet Take 1 tablet (40 mg total) by mouth daily. 08/22/19    Alisa Graff, FNP  levalbuterol [facility] HFA) 45 MCG/ACT inhaler Inhale 2 puffs into the lungs every 4 (four) hours as needed for wheezing. Inhale 2 puffs 3 times daily x4 days, then every 4 hours as needed. Patient not taking: Reported on 09/16/2019 08/02/19 08/01/20  Eugenie Filler, MD  metoprolol tartrate (LOPRESSOR) 25 MG tablet Take 1 tablet (25 mg total) by mouth 2 (two) times daily. 08/11/19   Minna Merritts, MD  Multiple Vitamin (MULTIVITAMIN WITH MINERALS) TABS tablet Take 1 tablet by mouth 2 (two) times daily.    [provider]  potassium chloride SA (KLOR-CON) 20 MEQ tablet Take 1 tablet (20 mEq total) by mouth daily. 08/11/19   Minna Merritts, MD    Review of Systems    Increasing lower extremity swelling with 10 pound weight gain, dyspnea, along with recurrent palpitations in the setting of recurrent atrial fibrillation.  She denies chest pain, palpitations, PND, orthopnea, dizziness, syncope, or early satiety.  All other systems reviewed and are otherwise negative except as noted above.  Physical Exam    VS:  BP 100/70 (BP Location: Left Arm, Patient Position: Sitting, Cuff Size: Normal)   Pulse 76   Ht 5\' 4"  (1.626 m)   Wt 209 lb 8 oz (95 kg)   SpO2 97%   BMI 35.96 kg/m  , BMI Body mass index is 35.96 kg/m. GEN: Well nourished, well developed, in no acute distress. HEENT: normal. Neck: Supple, no JVD, carotid bruits, or masses. Cardiac: Irregularly, irregular, no murmurs, rubs, or gallops. No clubbing, cyanosis, trace to 1+ bilateral ankle edema.  Radials/PT 2+ and equal bilaterally.  Respiratory:  Respirations regular and unlabored, clear to auscultation bilaterally. GI: Soft, nontender, nondistended, BS + x 4. MS: no deformity or atrophy. Skin: warm and dry, no rash. Neuro:  Strength and sensation are intact. Psych: Normal affect.  Accessory Clinical Findings    ECG personally reviewed by me today - afib, 76, inf, antlat twi - no acute  changes.  Lab Results  Component Value Date   WBC 8.6 09/22/2019   HGB 15.1 (H) 09/22/2019   HCT 42.9 09/22/2019   MCV 93.3 09/22/2019   PLT 218 09/22/2019   Lab Results  Component Value Date   CREATININE 0.79 09/22/2019   BUN 17 09/22/2019   NA 139 09/22/2019   K 4.1 09/22/2019   CL 103 09/22/2019   CO2 27 09/22/2019   Lab Results  Component Value Date   ALT 86 (H) 08/01/2019   AST 30 08/01/2019   ALKPHOS 63 08/01/2019   BILITOT 0.7 08/01/2019   Lab Results  Component Value Date   CHOL 121 07/31/2019   HDL 35 (L) 07/31/2019   LDLCALC 72 07/31/2019   TRIG 68 07/31/2019   CHOLHDL 3.5 07/31/2019     Assessment & Plan    1.  Persistent  Afib: Patient diagnosed with atrial fibrillation in November 2020 in the setting of presentation with acute diastolic heart failure.  Echo showed normal LV function with severely dilated left atrium and moderately elevated PASP.  She has been rate controlled on beta-blocker therapy and anticoagulated with Eliquis and subsequently underwent successful cardioversion on January 6 however, by January 7 she noted recurrent palpitations and tachycardia and has since been experiencing lower extremity swelling and need for additional Lasix.  She is in atrial fibrillation at a rate of 76 bpm today.  We did discuss options for management and I also reviewed her ECGs and echocardiogram with Dr. Klein-electrophysiology-today.  We will plan to initiate flecainide 50 mg twice daily today, though we do have concern that this may not be successful in the setting of severely dilated left atrium.  We would not expect flecainide to contribute to further bradycardia in the absence of heart block.  I will plan to see her back in approximately 1 week and if she remains in atrial fibrillation at that time, we can pursue cardioversion as she has not missed any doses of Eliquis.  She has never undergone sleep evaluation and we agreed that we can consider this in the near  future.  Finally, Dr. Caryl Comes has recommended that given youth and structural abnormalities noted on echo, that she may benefit from cardiac MRI to better define her chamber sizes and septal thickness, which may guide therapies in the future.  2.  Acute on chronic diastolic ingestive heart failure: In the setting of recurrent A. fib, she has noted increasing lower extremity swelling and is up 9.8 pounds since previous visit.  She has been doubling up on her Lasix and has trace to 1+ lower extremity edema today.  Continue Lasix 40 twice daily and follow-up basic metabolic panel today.  Hopefully with restoration of sinus rhythm, she will have resolution of heart failure symptoms.  Blood pressure and heart rate are currently well controlled.    3.  Tob abuse: Cessation advised.  4.  Disposition: Follow-up basic metabolic panel today.  Follow-up in clinic in 1 week or sooner if necessary.  For future planning, will need sleep study and can consider cardiac MRI as outlined above.  Murray Hodgkins, NP 10/02/2019, 9:07 AM

## 2019-10-02 ENCOUNTER — Other Ambulatory Visit: Payer: Self-pay

## 2019-10-02 ENCOUNTER — Ambulatory Visit (INDEPENDENT_AMBULATORY_CARE_PROVIDER_SITE_OTHER): Payer: Managed Care, Other (non HMO) | Admitting: Nurse Practitioner

## 2019-10-02 ENCOUNTER — Encounter: Payer: Self-pay | Admitting: Nurse Practitioner

## 2019-10-02 VITALS — BP 100/70 | HR 76 | Ht 64.0 in | Wt 209.5 lb

## 2019-10-02 DIAGNOSIS — Z720 Tobacco use: Secondary | ICD-10-CM

## 2019-10-02 DIAGNOSIS — I5033 Acute on chronic diastolic (congestive) heart failure: Secondary | ICD-10-CM

## 2019-10-02 DIAGNOSIS — I4819 Other persistent atrial fibrillation: Secondary | ICD-10-CM

## 2019-10-02 MED ORDER — FUROSEMIDE 40 MG PO TABS: 40.0000 mg | ORAL_TABLET | Freq: Two times a day (BID) | ORAL | 3 refills | Status: DC

## 2019-10-02 MED ORDER — FLECAINIDE ACETATE 50 MG PO TABS: 50.0000 mg | ORAL_TABLET | Freq: Two times a day (BID) | ORAL | 3 refills | Status: DC

## 2019-10-02 NOTE — Patient Instructions (Signed)
Medication Instructions:  1- We updated Lasix Rx to reflect what you have been taking. Take 1 tablet (40 mg total) by mouth 2 (two) times daily.  2- START Flecainide Take 1 tablet (50 mg total) by mouth 2 (two) times daily.  *If you need a refill on your cardiac medications before your next appointment, please call your pharmacy*  Lab Work: Your physician recommends that you have lab work today(BMET)  If you have labs (blood work) drawn today and your tests are completely normal, you will receive your results only by: Marland Kitchen MyChart Message (if you have MyChart) OR . A paper copy in the mail If you have any lab test that is abnormal or we need to change your treatment, we will call you to review the results.  Testing/Procedures: None ordered   Follow-Up: At [facility], you and your health needs are our priority.  As part of our continuing mission to provide you with exceptional heart care, we have created designated Provider Care Teams.  These Care Teams include your primary Cardiologist (physician) and Advanced Practice Providers (APPs -  Physician Assistants and Nurse Practitioners) who all work together to provide you with the care you need, when you need it.  Your next appointment:   1 week(s)  The format for your next appointment:   In Person  Provider:    You may see [name], MD or Murray Hodgkins, NP.

## 2019-10-03 LAB — BASIC METABOLIC PANEL
BUN/Creatinine Ratio: 24 — ABNORMAL HIGH (ref 9–23)
BUN: 17 mg/dL (ref 6–24)
CO2: 28 mmol/L (ref 20–29)
Calcium: 9.2 mg/dL (ref 8.7–10.2)
Chloride: 101 mmol/L (ref 96–106)
Creatinine, Ser: 0.71 mg/dL (ref 0.57–1.00)
GFR calc Af Amer: 116 mL/min/{1.73_m2} (ref >59)
GFR calc non Af Amer: 101 mL/min/{1.73_m2} (ref >59)
Glucose: 78 mg/dL (ref 65–99)
Potassium: 4.3 mmol/L (ref 3.5–5.2)
Sodium: 141 mmol/L (ref 134–144)

## 2019-10-08 NOTE — Progress Notes (Addendum)
Office Visit    Patient Name: Hannah Zimmerman Date of Encounter: 10/09/2019  Primary Care Provider:  Patient, No Pcp Per Primary Cardiologist:  [name], MD Electrophysiologist:  None   Chief Complaint    Hannah Zimmerman is a 49 y.o. female with a hx of morbid obesity s/p gastric bypass 2016, tobacco abuse, recent diagnosis HFpEF, persistent atrial fibrillation presents today for follow up of atrial fibrillation.   Past Medical History    Past Medical History:  Diagnosis Date  . (HFpEF) heart failure with preserved ejection fraction ([place])    a. 07/2019 Echo: EF 60-65%, mod LVH. Sev dil LA, mildly dil RA. Mod elev PASP.  Marland Kitchen Complication of anesthesia 20 years ago   a. spinal with first c section went too high stopped breathing, low bp with 2nd c section, no further issues with anesthesia  . Heart murmur    a. 07/2019 Echo: no significant valvular dzs.  Marland Kitchen LVH (left ventricular hypertrophy)    a. 07/2019 Echo: Mod LVH.  . Morbid obesity ([place])    a. 09/2014 s/p gastric bypass.  . Persistent atrial fibrillation ([place])    a. Dx 07/2019-->CHA2DS2VASc = 2 (diast CHF/Fem)-->Eliquis; b. 09/2018 s/p DCCV (150J (biphasic) x 1).  . Rash    both arms from old bed bug bites, healing  . Tobacco abuse    Past Surgical History:  Procedure Laterality Date  . ABDOMINAL HYSTERECTOMY    . ANKLE SURGERY     left x 2  . CARDIOVERSION N/A 09/24/2019   Procedure: CARDIOVERSION;  Surgeon: Minna Merritts, MD;  Location: [facility] ORS;  Service: Cardiovascular;  Laterality: N/A;  . CESAREAN SECTION     x 3  . LAPAROSCOPIC GASTRIC SLEEVE RESECTION WITH HIATAL HERNIA REPAIR  08/17/2015   Procedure: LAPAROSCOPIC GASTRIC SLEEVE RESECTION WITH HIATAL HERNIA REPAIR;  Surgeon: Johnathan Hausen, MD;  Location: [facility] ORS;  Service: General;;  . TUBAL LIGATION     x 2  . UPPER GI ENDOSCOPY  08/17/2015   Procedure: UPPER GI ENDOSCOPY;  Surgeon: Johnathan Hausen, MD;  Location: [facility] ORS;  Service: General;;     Allergies  Allergies  Allergen Reactions  . Codeine Hives       . Hydrocodone Hives    History of Present Illness    Hannah Zimmerman is a 49 y.o. female with a hx of arrhythmia/PE gastric bypass thousand 17, tobacco use, HFpEF, persistent atrial fibrillation on anticoagulation and flecainide therapy.  Last seen by Ignacia Bayley, NP 10/02/19.  November 2020 presented to [facility] with 2-week history of increasing lower extremity edema and 15 pound weight gain.  Noted to be in atrial fibrillation.  Echo with normal LV function with moderate LVH and severely dilated LA.  Pulmonary artery systolic pressure moderately elevated.  Post diuresis tender to 100 pounds was discharged on Lasix, digoxin, metoprolol and Eliquis.  She had cardioversion 07/24/2020.  Unfortunately lateral sinus rhythm for 1 day and her conversion back to the 130s by 09/25/2019.  At her last office visit 10/01/2018 and flecainide was initiated.  She was recommended to follow-up today for consideration of repeat cardioversion.  EKG today shows rate controlled atrial fibrillation.  No acute evidence of QTC prolongation.  She has been checking her heart rate at home and reports it is typically 70s 80s.  She has however atrial fibrillation she notes her lower extremities are weak and she tires more quickly normal.  But she is still holding onto some fluid but  reports this is better compared to when she was last seen.  Her weight at home has been trending down.  On our scale today she is down 6 pounds from her visit last week.  We discussed repeat cardioversion in the flecainide or her planing of heart hold sinus rhythm.  She is agreeable to proceed with procedure.  We discussed the risks and benefits at length.  She tells me he has missed no doses of her Eliquis in the last 3 weeks.  Denies bleeding complications. EKGs/Labs/Other Studies Reviewed:   The following studies were reviewed today:  Echo 07/31/2019  1. Left ventricular  ejection fraction, by visual estimation, is 60 to 65%. The left ventricle has normal function. There is moderately increased left ventricular hypertrophy.  2. Left ventricular diastolic parameters are indeterminate.  3. Global right ventricle has normal systolic function.The right ventricular size is normal. No increase in right ventricular wall thickness.  4. Left atrial size was severely dilated.  5. Right atrial size was mildly dilated  6. Moderately elevated pulmonary artery systolic pressure.  7. Rhythm is atrial fibrillation.   EKG:  EKG is ordered today.  The ekg ordered today demonstrates rate controlled atrial fibrillation stable t-wave inversion in inferiorior and anteriorlateral leads - no acute ST/T wave changes. Two EKGs today with same findings with exception of rates - one with rate 78 bpm and 1 with rate 59 bpm.   Recent Labs: 07/30/2019: B Natriuretic Peptide 918.0; TSH 2.226 08/01/2019: ALT 86; Magnesium 2.0 09/22/2019: Hemoglobin 15.1; Platelets 218 10/02/2019: BUN 17; Creatinine, Ser 0.71; Potassium 4.3; Sodium 141  Recent Lipid Panel    Component Value Date/Time   CHOL 121 07/31/2019 0559   TRIG 68 07/31/2019 0559   HDL 35 (L) 07/31/2019 0559   CHOLHDL 3.5 07/31/2019 0559   VLDL 14 07/31/2019 0559   LDLCALC 72 07/31/2019 0559    Home Medications   Current Meds  Medication Sig  . apixaban (ELIQUIS) 5 MG TABS tablet Take 1 tablet (5 mg total) by mouth 2 (two) times daily.  . calcium citrate-vitamin D (CITRACAL+D) 315-200 MG-UNIT tablet Take 1 tablet by mouth 2 (two) times daily.  . cholecalciferol (VITAMIN D3) 25 MCG (1000 UT) tablet Take 1,000 Units by mouth daily.  . flecainide (TAMBOCOR) 50 MG tablet Take 1 tablet (50 mg total) by mouth 2 (two) times daily.  . furosemide (LASIX) 40 MG tablet Take 1 tablet (40 mg total) by mouth 2 (two) times daily.  Marland Kitchen levalbuterol (XOPENEX HFA) 45 MCG/ACT inhaler Inhale 2 puffs into the lungs every 4 (four) hours as needed for  wheezing. Inhale 2 puffs 3 times daily x4 days, then every 4 hours as needed. (Patient taking differently: Inhale 2 puffs into the lungs every 4 (four) hours as needed for wheezing. )  . metoprolol tartrate (LOPRESSOR) 25 MG tablet Take 1 tablet (25 mg total) by mouth 2 (two) times daily.  . Multiple Vitamin (MULTIVITAMIN WITH MINERALS) TABS tablet Take 1 tablet by mouth 2 (two) times daily.  . potassium chloride SA (KLOR-CON) 20 MEQ tablet Take 1 tablet (20 mEq total) by mouth daily.      Review of Systems      Review of Systems  Constitution: Positive for malaise/fatigue. Negative for chills and fever.  Cardiovascular: Positive for irregular heartbeat and leg swelling. Negative for chest pain, dyspnea on exertion, near-syncope, orthopnea, palpitations and syncope.  Respiratory: Negative for cough, shortness of breath and wheezing.   Gastrointestinal: Negative for melena, nausea  and vomiting.  Genitourinary: Negative for hematuria.  Neurological: Negative for dizziness, light-headedness and weakness.   All other systems reviewed and are otherwise negative except as noted above.  Physical Exam    VS:  BP 100/64 (BP Location: Right Arm, Patient Position: Sitting, Cuff Size: Large)   Pulse (!) 59   Ht 5\' 4"  (1.626 m)   Wt 203 lb (92.1 kg)   SpO2 98%   BMI 34.84 kg/m  , BMI Body mass index is 34.84 kg/m. GEN: Well nourished, overweight, well developed, in no acute distress. HEENT: normal. Neck: Supple, no JVD, carotid bruits, or masses. Cardiac: Irregularly irregular, no murmurs, rubs, or gallops. No clubbing, cyanosis.  Trace pedal edema bilateral.  Radials/DP/PT 2+ and equal bilaterally.  Respiratory:  Respirations regular and unlabored, clear to auscultation bilaterally. GI: Soft, nontender, nondistended, BS + x 4. MS: No deformity or atrophy. Skin: Warm and dry, no rash. Neuro:  Strength and sensation are intact. Psych: Normal affect.  Accessory Clinical Findings    ECG  personally reviewed by me today -  rate controlled atrial fibrillation stable t-wave inversion in inferiorior and anteriorlateral leads - no acute ST/T wave changes. Two EKGs today with same findings with exception of rates - one with rate 78 bpm and 1 with rate 59 bpm.  - no acute changes.  Assessment & Plan    1. Persistent atrial fibrillation -s/p cardioversion 09/24/2019 that lasted 24 hours.  She has since been started on Flecainide.  Her atrial fibrillation is symptomatic fluid retention, fatigue.  Heart rate better controlled since addition of Flecainide 50 mg twice daily.  Anticoagulated on Eliquis.  Continue present metoprolol dose.  We will plan for cardioversion.  Risks and benefits of the procedure were discussed with the patient in depth and she is agreeable to proceed.  Has never undergone sleep evaluation.  We discussed this today and she will consider in the future  At last office visit with Jorja Loa, NP -  Dr. Caryl Comes recommended given youth and structural abnormalities noted on echo that she may benefit from cardiac MRI to better define chamber sizes and septal thickness to guide future therapies.  Cardiac MRI ordered today.  2. Chronic anticoagulation - Secondary to PAF.  Denies missed doses of Eliquis.  Denies bleeding complications.  CBC prior to cardioversion.  3. High risk medication use - On Flecainide secondary to PAF.  EKG today after initiation shows rate controlled atrial fibrillation with normal QTC.  Remains on metoprolol for rate control.  4. Chronic diastolic congestive heart failure -Trace lower extremity edema on exam today..  Down 6 pounds since her last visit.  Improvement in volume status with improvement in rate control.  Continue Lasix 40 mg twice daily, potassium 20 mEq daily.  Edema next week.  Hopefully with restoration of sinus rhythm until resolution of heart failure symptoms.  5. Tobacco use - Smoking cessation encouraged. Recommend utilization of  [id number].  Disposition: BMET, CBC, COVID test 10/15/19. Plan for cardioversion 10/17/19. Follow up in 1 week(s) after cardioversion with Dr. Rockey Situ on 10/27/19.  Cardiac MRI ordered today.  Loel Dubonnet, NP 10/09/2019, 8:27 AM

## 2019-10-09 ENCOUNTER — Other Ambulatory Visit: Payer: Self-pay

## 2019-10-09 ENCOUNTER — Ambulatory Visit (INDEPENDENT_AMBULATORY_CARE_PROVIDER_SITE_OTHER): Payer: Managed Care, Other (non HMO) | Admitting: Family

## 2019-10-09 ENCOUNTER — Encounter: Payer: Self-pay | Admitting: Family

## 2019-10-09 VITALS — BP 100/64 | HR 59 | Ht 64.0 in | Wt 203.0 lb

## 2019-10-09 DIAGNOSIS — Z7901 Long term (current) use of anticoagulants: Secondary | ICD-10-CM

## 2019-10-09 DIAGNOSIS — I517 Cardiomegaly: Secondary | ICD-10-CM | POA: Diagnosis not present

## 2019-10-09 DIAGNOSIS — I4819 Other persistent atrial fibrillation: Secondary | ICD-10-CM

## 2019-10-09 DIAGNOSIS — Z720 Tobacco use: Secondary | ICD-10-CM | POA: Diagnosis not present

## 2019-10-09 DIAGNOSIS — Z79899 Other long term (current) drug therapy: Secondary | ICD-10-CM

## 2019-10-09 DIAGNOSIS — I5032 Chronic diastolic (congestive) heart failure: Secondary | ICD-10-CM

## 2019-10-09 NOTE — Patient Instructions (Addendum)
Medication Instructions:  Your physician recommends that you continue on your current medications as directed. Please refer to the Current Medication list given to you today. *If you need a refill on your cardiac medications before your next appointment, please call your pharmacy*  Lab Work: 1- Your physician recommends that you return for lab work on 1/27 at the [facility] mall. (CBC, BMET) No appt is needed. Hours are M-F 7AM- 6 PM. 2- CV19 Pre admit testing DRIVE THRU  Please report to the PAT testing site ([facility]) on _____1/27____ date ______12:30-2:30PM_____ time for your DRIVE THRU covid testing that is required prior to your procedure.  Following covid testing, please remain in quarantine. If you must be around others, please wash hands, avoid touching face and wear your mask.    If you have labs (blood work) drawn today and your tests are completely normal, you will receive your results only by: Marland Kitchen MyChart Message (if you have MyChart) OR . A paper copy in the mail If you have any lab test that is abnormal or we need to change your treatment, we will call you to review the results.  Testing/Procedures: 1-  You are scheduled for a Cardioversion on 1/29 with Dr. Rockey Situ.  Please arrive at the [place] [facility] at 0630 am. (1 hour prior to procedure unless lab work is needed; if lab work is needed arrive 1.5 hours ahead)  DIET: Nothing to eat or drink after midnight except a sip of water with medications (see medication instructions below)  Medication Instructions: Hold Lasix  Take Metoprolol  Continue your anticoagulant: Eliquis You will need to continue your anticoagulant after your procedure until you are told by your  Provider that it is safe to stop   Labs: see other   You must have a responsible person to drive you home and stay in the waiting area during your procedure. Failure to do so could result in cancellation.  Bring your insurance  cards.  *Special Note: Every effort is made to have your procedure done on time. Occasionally there are emergencies that occur at the hospital that may cause delays. Please be patient if a delay does occur.   2- Scheduling will call you!! You are scheduled for Cardiac MRI on ______. Please arrive at the [facility] main entrance of [facility] at ________ (30-45 minutes prior to test start time). ?  [facility]  [address]  [place], [place] [address]  [phone]  Proceed to the [facility] Radiology Department (First Floor).  ?  Magnetic resonance imaging (MRI) is a painless test that produces images of the inside of the body without using X-rays. During an MRI, strong magnets and radio waves work together in a [employer], political party to form detailed images. MRI images may provide more details about a medical condition than X-rays, CT scans, and ultrasounds can provide.  You may be given earphones to listen for instructions.  You may eat a light breakfast and take medications as ordered with the exception of Lasix (fluid pill, other). If a contrast material will be used, an IV will be inserted into one of your veins. Contrast material will be injected into your IV.  You will be asked to remove all metal, including: Watch, jewelry, and other metal objects including hearing aids, hair pieces and dentures. (Braces and fillings normally are not a problem.)  If contrast material was used:  It will leave your body through your urine within a  day. You may be told to drink plenty of fluids to help flush the contrast material out of your system.  TEST WILL TAKE APPROXIMATELY 1 HOUR  PLEASE NOTIFY SCHEDULING AT LEAST 24 HOURS IN ADVANCE IF YOU ARE UNABLE TO KEEP YOUR APPOINTMENT.     Follow-Up: At [facility], you and your health needs are our priority.  As part of our continuing mission to provide you with exceptional heart care, we have created designated Provider Care Teams.   These Care Teams include your primary Cardiologist (physician) and Advanced Practice Providers (APPs -  Physician Assistants and Nurse Practitioners) who all work together to provide you with the care you need, when you need it.  Your next appointment:   2-3 weeks with Dr. Rockey Situ or app.

## 2019-10-15 ENCOUNTER — Other Ambulatory Visit
Admission: RE | Admit: 2019-10-15 | Discharge: 2019-10-15 | Disposition: A | Discharge status: Home / Self Care | Payer: Managed Care, Other (non HMO) | Source: Ambulatory Visit | Attending: Cardiovascular Disease | Admitting: Cardiovascular Disease

## 2019-10-15 ENCOUNTER — Telehealth: Payer: Self-pay

## 2019-10-15 ENCOUNTER — Other Ambulatory Visit
Admission: RE | Admit: 2019-10-15 | Discharge: 2019-10-15 | Disposition: A | Discharge status: Home / Self Care | Payer: Managed Care, Other (non HMO) | Source: Ambulatory Visit | Attending: Family | Admitting: Family

## 2019-10-15 ENCOUNTER — Other Ambulatory Visit: Payer: Self-pay

## 2019-10-15 DIAGNOSIS — Z20822 Contact with and (suspected) exposure to covid-19: Secondary | ICD-10-CM | POA: Insufficient documentation

## 2019-10-15 DIAGNOSIS — Z01812 Encounter for preprocedural laboratory examination: Secondary | ICD-10-CM | POA: Insufficient documentation

## 2019-10-15 DIAGNOSIS — I4819 Other persistent atrial fibrillation: Secondary | ICD-10-CM

## 2019-10-15 LAB — BASIC METABOLIC PANEL
Anion gap: 7 (ref 5–15)
BUN: 18 mg/dL (ref 6–20)
CO2: 29 mmol/L (ref 22–32)
Calcium: 8.6 mg/dL — ABNORMAL LOW (ref 8.9–10.3)
Chloride: 105 mmol/L (ref 98–111)
Creatinine, Ser: 0.85 mg/dL (ref 0.44–1.00)
GFR calc Af Amer: >60 mL/min (ref >60)
GFR calc non Af Amer: >60 mL/min (ref >60)
Glucose, Bld: 81 mg/dL (ref 70–99)
Potassium: 4 mmol/L (ref 3.5–5.1)
Sodium: 141 mmol/L (ref 135–145)

## 2019-10-15 LAB — SARS CORONAVIRUS 2 (TAT 6-24 HRS): SARS Coronavirus 2: NEGATIVE

## 2019-10-15 LAB — CBC
HCT: 47.2 % — ABNORMAL HIGH (ref 36.0–46.0)
Hemoglobin: 15.9 g/dL — ABNORMAL HIGH (ref 12.0–15.0)
MCH: 32.7 pg (ref 26.0–34.0)
MCHC: 33.7 g/dL (ref 30.0–36.0)
MCV: 97.1 fL (ref 80.0–100.0)
Platelets: 275 10*3/uL (ref 150–400)
RBC: 4.86 MIL/uL (ref 3.87–5.11)
RDW: 15 % (ref 11.5–15.5)
WBC: 9.5 10*3/uL (ref 4.0–10.5)
nRBC: 0 % (ref 0.0–0.2)

## 2019-10-15 NOTE — Telephone Encounter (Signed)
Results provided to patient as per Laurann Montana, NP. Pt had no questions and she verbalized understanding.

## 2019-10-15 NOTE — Telephone Encounter (Signed)
-----   Message from Loel Dubonnet, NP sent at 10/15/2019  1:36 PM EST ----- CBC (complete blood count) is stable compared to previous. No evidence of infection or anemia. Normal kidney function. Normal potassium. Calcium mildly low, but stable compared to previous and not of concern. Continue with plan for cardioversion as discussed in office visit.

## 2019-10-15 NOTE — Telephone Encounter (Signed)
LVM to call back.

## 2019-10-17 ENCOUNTER — Ambulatory Visit: Payer: Managed Care, Other (non HMO) | Admitting: Anesthesiology

## 2019-10-17 ENCOUNTER — Other Ambulatory Visit: Payer: Self-pay

## 2019-10-17 ENCOUNTER — Encounter: Payer: Self-pay | Admitting: Cardiovascular Disease

## 2019-10-17 ENCOUNTER — Ambulatory Visit
Admission: RE | Admit: 2019-10-17 | Discharge: 2019-10-17 | Disposition: A | Discharge status: Home / Self Care | Payer: Managed Care, Other (non HMO) | Attending: Cardiovascular Disease | Admitting: Cardiovascular Disease

## 2019-10-17 ENCOUNTER — Encounter
Admission: RE | Disposition: A | Discharge status: Home / Self Care | Payer: Managed Care, Other (non HMO) | Source: Home / Self Care | Attending: Cardiovascular Disease

## 2019-10-17 DIAGNOSIS — I4819 Other persistent atrial fibrillation: Secondary | ICD-10-CM

## 2019-10-17 DIAGNOSIS — Z885 Allergy status to narcotic agent status: Secondary | ICD-10-CM | POA: Insufficient documentation

## 2019-10-17 DIAGNOSIS — F17200 Nicotine dependence, unspecified, uncomplicated: Secondary | ICD-10-CM | POA: Insufficient documentation

## 2019-10-17 DIAGNOSIS — Z7901 Long term (current) use of anticoagulants: Secondary | ICD-10-CM | POA: Diagnosis not present

## 2019-10-17 DIAGNOSIS — Z6834 Body mass index (BMI) 34.0-34.9, adult: Secondary | ICD-10-CM | POA: Diagnosis not present

## 2019-10-17 DIAGNOSIS — Z79899 Other long term (current) drug therapy: Secondary | ICD-10-CM | POA: Insufficient documentation

## 2019-10-17 DIAGNOSIS — I5032 Chronic diastolic (congestive) heart failure: Secondary | ICD-10-CM | POA: Insufficient documentation

## 2019-10-17 DIAGNOSIS — Z9884 Bariatric surgery status: Secondary | ICD-10-CM | POA: Diagnosis not present

## 2019-10-17 HISTORY — PX: CARDIOVERSION: SHX1299

## 2019-10-17 SURGERY — CARDIOVERSION
Anesthesia: General

## 2019-10-17 MED ORDER — SODIUM CHLORIDE 0.9 % IV SOLN: INTRAVENOUS | Status: DC | PRN

## 2019-10-17 MED ORDER — ONDANSETRON HCL 4 MG/2ML IJ SOLN: 4.0000 mg | Freq: Once | INTRAMUSCULAR | Status: DC | PRN

## 2019-10-17 MED ORDER — FENTANYL CITRATE (PF) 100 MCG/2ML IJ SOLN: 25.0000 ug | INTRAMUSCULAR | Status: DC | PRN

## 2019-10-17 MED ORDER — PROPOFOL 10 MG/ML IV BOLUS
INTRAVENOUS | Status: DC | PRN
  Administered 2019-10-17: 80 mg via INTRAVENOUS

## 2019-10-17 MED ORDER — PROPOFOL 10 MG/ML IV BOLUS
INTRAVENOUS | Status: AC
  Filled 2019-10-17: qty 20

## 2019-10-17 MED ORDER — EPHEDRINE SULFATE 50 MG/ML IJ SOLN
INTRAMUSCULAR | Status: DC | PRN
  Administered 2019-10-17: 10 mg via INTRAVENOUS

## 2019-10-17 NOTE — Progress Notes (Signed)
Reviewed medication change recommendations with patient per Dr. Donivan Scull verbal instructions given to patient. Patient states understanding and no further questions at this time. Patient states she will contact Dr. Donivan Scull office if she has any questions. Recommended to patient to monitor blood pressure and heart rate periodically.

## 2019-10-17 NOTE — Anesthesia Preprocedure Evaluation (Signed)
Anesthesia Evaluation  Patient identified by MRN, date of birth, ID band Patient awake    Reviewed: Allergy & Precautions, H&P , NPO status , Patient's Chart, lab work & pertinent test results, reviewed documented beta blocker date and time   History of Anesthesia Complications (+) PROLONGED EMERGENCE and history of anesthetic complications  Airway Mallampati: III  TM Distance: >3 FB Neck ROM: full    Dental  (+) Dental Advidsory Given, Poor Dentition, Missing   Pulmonary neg shortness of breath, neg COPD, neg recent URI, Current Smoker and Patient abstained from smoking.,    Pulmonary exam normal        Cardiovascular Exercise Tolerance: Good (-) angina+CHF  (-) CAD, (-) Past MI, (-) Cardiac Stents and (-) CABG + dysrhythmias Atrial Fibrillation + Valvular Problems/Murmurs      Neuro/Psych negative neurological ROS  negative psych ROS   GI/Hepatic negative GI ROS, Neg liver ROS,   Endo/Other  negative endocrine ROS  Renal/GU negative Renal ROS  negative genitourinary   Musculoskeletal   Abdominal   Peds  Hematology negative hematology ROS (+)   Anesthesia Other Findings Past Medical History: No date: Arrhythmia     Comment:  atrial fibrillation No date: CHF (congestive heart failure) (HCC) 20 years ago: Complication of anesthesia     Comment:  spinal with first c section went too high stopped               breathing, low bp with 2nd c section, no further issues               with anesthesia No date: Heart murmur No date: LVH (left ventricular hypertrophy)     Comment:  with pulmonary hypertension No date: Rash     Comment:  both arms from old bed bug bites, healing No date: Tachycardia   Reproductive/Obstetrics negative OB ROS                             Anesthesia Physical  Anesthesia Plan  ASA: III  Anesthesia Plan: General   Post-op Pain Management:    Induction:  Intravenous  PONV Risk Score and Plan: 2 and Propofol infusion and TIVA  Airway Management Planned: Nasal Cannula and Natural Airway  Additional Equipment:   Intra-op Plan:   Post-operative Plan:   Informed Consent: I have reviewed the patients History and Physical, chart, labs and discussed the procedure including the risks, benefits and alternatives for the proposed anesthesia with the patient or authorized representative who has indicated his/her understanding and acceptance.     Dental Advisory Given  Plan Discussed with: Anesthesiologist, CRNA and Surgeon  Anesthesia Plan Comments:         Anesthesia Quick Evaluation

## 2019-10-17 NOTE — Transfer of Care (Signed)
Immediate Anesthesia Transfer of Care Note  Patient: Hannah Zimmerman  Procedure(s) Performed: CARDIOVERSION (N/A )  Patient Location: PACU  Anesthesia Type:General  Level of Consciousness: awake, alert  and oriented  Airway & Oxygen Therapy: Patient Spontanous Breathing and Patient connected to nasal cannula oxygen  Post-op Assessment: Report given to RN and Post -op Vital signs reviewed and stable  Post vital signs: Reviewed and stable  Last Vitals:  Vitals Value Taken Time  BP    Temp    Pulse    Resp    SpO2      Last Pain:  Vitals:   10/17/19 0649  TempSrc: Oral  PainSc: 0-No pain         Complications: No apparent anesthesia complications

## 2019-10-17 NOTE — Discharge Instructions (Signed)
Moderate Conscious Sedation, Adult, Care After These instructions provide you with information about caring for yourself after your procedure. Your health care provider may also give you more specific instructions. Your treatment has been planned according to current medical practices, but problems sometimes occur. Call your health care provider if you have any problems or questions after your procedure. What can I expect after the procedure? After your procedure, it is common:  To feel sleepy for several hours.  To feel clumsy and have poor balance for several hours.  To have poor judgment for several hours.  To vomit if you eat too soon. Follow these instructions at home: For at least 24 hours after the procedure:   Do not: ? Participate in activities where you could fall or become injured. ? Drive. ? Use heavy machinery. ? Drink alcohol. ? Take sleeping pills or medicines that cause drowsiness. ? Make important decisions or sign legal documents. ? Take care of children on your own.  Rest. Eating and drinking  Follow the diet recommended by your health care provider.  If you vomit: ? Drink water, juice, or soup when you can drink without vomiting. ? Make sure you have little or no nausea before eating solid foods. General instructions  Have a responsible adult stay with you until you are awake and alert.  Take over-the-counter and prescription medicines only as told by your health care provider.  If you smoke, do not smoke without supervision.  Keep all follow-up visits as told by your health care provider. This is important. Contact a health care provider if:  You keep feeling nauseous or you keep vomiting.  You feel light-headed.  You develop a rash.  You have a fever. Get help right away if:  You have trouble breathing. This information is not intended to replace advice given to you by your health care provider. Make sure you discuss any questions you have  with your health care provider. Document Revised: 08/17/2017 Document Reviewed: 12/25/2015 Elsevier Patient Education  2020 Elsevier Inc. Electrical Cardioversion Electrical cardioversion is the delivery of a jolt of electricity to restore a normal rhythm to the heart. A rhythm that is too fast or is not regular keeps the heart from pumping well. In this procedure, sticky patches or metal paddles are placed on the chest to deliver electricity to the heart from a device. This procedure may be done in an emergency if:  There is low or no blood pressure as a result of the heart rhythm.  Normal rhythm must be restored as fast as possible to protect the brain and heart from further damage.  It may save a life. This may also be a scheduled procedure for irregular or fast heart rhythms that are not immediately life-threatening. Tell a health care provider about:  Any allergies you have.  All medicines you are taking, including vitamins, herbs, eye drops, creams, and over-the-counter medicines.  Any problems you or family members have had with anesthetic medicines.  Any blood disorders you have.  Any surgeries you have had.  Any medical conditions you have.  Whether you are pregnant or may be pregnant. What are the risks? Generally, this is a safe procedure. However, problems may occur, including:  Allergic reactions to medicines.  A blood clot that breaks free and travels to other parts of your body.  The possible return of an abnormal heart rhythm within hours or days after the procedure.  Your heart stopping (cardiac arrest). This is rare. What happens   before the procedure? Medicines  Your health care provider may have you start taking: ? Blood-thinning medicines (anticoagulants) so your blood does not clot as easily. ? Medicines to help stabilize your heart rate and rhythm.  Ask your health care provider about: ? Changing or stopping your regular medicines. This is  especially important if you are taking diabetes medicines or blood thinners. ? Taking medicines such as aspirin and ibuprofen. These medicines can thin your blood. Do not take these medicines unless your health care provider tells you to take them. ? Taking over-the-counter medicines, vitamins, herbs, and supplements. General instructions  Follow instructions from your health care provider about eating or drinking restrictions.  Plan to have someone take you home from the hospital or clinic.  If you will be going home right after the procedure, plan to have someone with you for 24 hours.  Ask your health care provider what steps will be taken to help prevent infection. These may include washing your skin with a germ-killing soap. What happens during the procedure?   An IV will be inserted into one of your veins.  Sticky patches (electrodes) or metal paddles may be placed on your chest.  You will be given a medicine to help you relax (sedative).  An electrical shock will be delivered. The procedure may vary among health care providers and hospitals. What can I expect after the procedure?  Your blood pressure, heart rate, breathing rate, and blood oxygen level will be monitored until you leave the hospital or clinic.  Your heart rhythm will be watched to make sure it does not change.  You may have some redness on the skin where the shocks were given. Follow these instructions at home:  Do not drive for 24 hours if you were given a sedative during your procedure.  Take over-the-counter and prescription medicines only as told by your health care provider.  Ask your health care provider how to check your pulse. Check it often.  Rest for 48 hours after the procedure or as told by your health care provider.  Avoid or limit your caffeine use as told by your health care provider.  Keep all follow-up visits as told by your health care provider. This is important. Contact a health  care provider if:  You feel like your heart is beating too quickly or your pulse is not regular.  You have a serious muscle cramp that does not go away. Get help right away if:  You have discomfort in your chest.  You are dizzy or you feel faint.  You have trouble breathing or you are short of breath.  Your speech is slurred.  You have trouble moving an arm or leg on one side of your body.  Your fingers or toes turn cold or blue. Summary  Electrical cardioversion is the delivery of a jolt of electricity to restore a normal rhythm to the heart.  This procedure may be done right away in an emergency or may be a scheduled procedure if the condition is not an emergency.  Generally, this is a safe procedure.  After the procedure, check your pulse often as told by your health care provider. This information is not intended to replace advice given to you by your health care provider. Make sure you discuss any questions you have with your health care provider. Document Revised: 04/07/2019 Document Reviewed: 04/07/2019 Elsevier Patient Education  2020 Elsevier Inc.  

## 2019-10-17 NOTE — H&P (Signed)
H&P Addendum, pre-cardioversion  Patient was seen and evaluated prior to -cardioversion procedure Symptoms, prior testing details again confirmed with the patient Patient examined, no significant change from prior exam Lab work reviewed in detail personally by myself Patient understands risk and benefit of the procedure, willing to proceed  Signed, Tim Falynn Ailey, MD, Ph.D [facility]  

## 2019-10-17 NOTE — CV Procedure (Signed)
Cardioversion procedure note For atrial fibrillation/flutter, persistent.  Procedure Details:  Consent: Risks of procedure as well as the alternatives and risks of each were explained to the (patient/caregiver). Consent for procedure obtained.  Time Out: Verified patient identification, verified procedure, site/side was marked, verified correct patient position, special equipment/implants available, medications/allergies/relevent history reviewed, required imaging and test results available. Performed  Patient placed on cardiac monitor, pulse oximetry, supplemental oxygen as necessary.  Sedation given: propofol IV, Dr. Kayleen Memos Pacer pads placed anterior and posterior chest.   Cardioverted 1 time(s).  Cardioverted at  150 J. Synchronized biphasic Converted to NSR   Evaluation: Findings: Post procedure EKG shows: NSR Complications: None Patient did tolerate procedure well.  Time Spent Directly with the Patient:  52 minutes   Esmond Plants, M.D., Ph.D.

## 2019-10-17 NOTE — Anesthesia Procedure Notes (Signed)
Date/Time: 10/17/2019 7:23 AM Performed by: Nelda Marseille, CRNA Pre-anesthesia Checklist: Patient identified, Emergency Drugs available, Suction available, Patient being monitored and Timeout performed Oxygen Delivery Method: Nasal cannula

## 2019-10-18 NOTE — Anesthesia Postprocedure Evaluation (Signed)
Anesthesia Post Note  Patient: Hannah Zimmerman  Procedure(s) Performed: CARDIOVERSION (N/A )  Patient location during evaluation: PACU Anesthesia Type: General Level of consciousness: awake and alert and oriented Pain management: pain level controlled Vital Signs Assessment: post-procedure vital signs reviewed and stable Respiratory status: spontaneous breathing Cardiovascular status: blood pressure returned to baseline Anesthetic complications: no     Last Vitals:  Vitals:   10/17/19 0815 10/17/19 0830  BP: (!) 84/63 (!) 86/64  Pulse: (!) 53 (!) 50  Resp: 15 13  Temp:    SpO2: 98% 99%    Last Pain:  Vitals:   10/17/19 0830  TempSrc:   PainSc: 0-No pain                 Sanders Manninen

## 2019-10-18 NOTE — Progress Notes (Deleted)
Patient ID: ALEXYZ GUT, female    DOB: 11/07/1970, 49 y.o.   MRN: [id number]  HPI  Ms Hannah Zimmerman is a 49 y/o female with a history of atrial fibrillation, pulmonary HTN, current tobacco use and chronic heart failure.   Echo report from 07/31/2019 reviewed and showed an EF of 60-65% along with moderately elevated PA pressure.   Cardioverted as outpatient on 10/17/19. Admitted 07/30/2019 due to new-onset atrial fibrillation. Cardiology consult obtained. CT angiogram was negative for PE. Initially given IV lasix and then transitioned to oral diuretics. Given IV cardizem for a. Fib. Given antibiotics and solu-medrol due to bronchitis/wheezing. Discharged after 3 days.   She presents today for a follow-up visit with a chief complaint of   .   Past Medical History:  Diagnosis Date  . (HFpEF) heart failure with preserved ejection fraction ([place])    a. 07/2019 Echo: EF 60-65%, mod LVH. Sev dil LA, mildly dil RA. Mod elev PASP.  Marland Kitchen Complication of anesthesia 20 years ago   a. spinal with first c section went too high stopped breathing, low bp with 2nd c section, no further issues with anesthesia  . Heart murmur    a. 07/2019 Echo: no significant valvular dzs.  Marland Kitchen LVH (left ventricular hypertrophy)    a. 07/2019 Echo: Mod LVH.  . Morbid obesity ([place])    a. 09/2014 s/p gastric bypass.  . Persistent atrial fibrillation ([place])    a. Dx 07/2019-->CHA2DS2VASc = 2 (diast CHF/Fem)-->Eliquis; b. 09/2018 s/p DCCV (150J (biphasic) x 1).  . Rash    both arms from old bed bug bites, healing  . Tobacco abuse    Past Surgical History:  Procedure Laterality Date  . ABDOMINAL HYSTERECTOMY    . ANKLE SURGERY     left x 2  . CARDIOVERSION N/A 09/24/2019   Procedure: CARDIOVERSION;  Surgeon: Minna Merritts, MD;  Location: [facility] ORS;  Service: Cardiovascular;  Laterality: N/A;  . CARDIOVERSION N/A 10/17/2019   Procedure: CARDIOVERSION;  Surgeon: Minna Merritts, MD;  Location: [facility] ORS;  Service:  Cardiovascular;  Laterality: N/A;  . CESAREAN SECTION     x 3  . LAPAROSCOPIC GASTRIC SLEEVE RESECTION WITH HIATAL HERNIA REPAIR  08/17/2015   Procedure: LAPAROSCOPIC GASTRIC SLEEVE RESECTION WITH HIATAL HERNIA REPAIR;  Surgeon: Johnathan Hausen, MD;  Location: [facility];  Service: General;;  . TUBAL LIGATION     x 2  . UPPER GI ENDOSCOPY  08/17/2015   Procedure: UPPER GI ENDOSCOPY;  Surgeon: Johnathan Hausen, MD;  Location: [facility];  Service: General;;   Family History  Problem Relation Age of Onset  . Hypertension Mother   . Diabetes Mother   . Heart failure Mother    Social History   Tobacco Use  . Smoking status: Current Every Day Smoker    Packs/day: 0.50    Years: 31.00    Pack years: 15.50    Types: Cigarettes    Last attempt to quit: 05/03/2015    Years since quitting: 4.4  . Smokeless tobacco: Never Used  Substance Use Topics  . Alcohol use: Yes    Alcohol/week: 1.0 standard drinks    Types: 1 Standard drinks or equivalent per week   Allergies  Allergen Reactions  . Codeine Hives       . Hydrocodone Hives     Review of Systems  Constitutional: Positive for appetite change (decreased) and fatigue.  HENT: Negative for congestion, postnasal drip and sore throat.   Eyes: Negative.  Respiratory: Negative for chest tightness and shortness of breath.   Cardiovascular: Positive for palpitations (at times). Negative for chest pain and leg swelling.  Gastrointestinal: Negative for abdominal distention and abdominal pain.  Endocrine: Negative.   Genitourinary: Negative.   Musculoskeletal: Positive for back pain. Negative for neck pain.  Skin: Negative.   Allergic/Immunologic: Negative.   Neurological: Positive for dizziness and light-headedness.  Hematological: Negative for adenopathy. Does not bruise/bleed easily.  Psychiatric/Behavioral: Negative for dysphoric mood and sleep disturbance. The patient is nervous/anxious.        Physical Exam Vitals and nursing note  reviewed.  Constitutional:      Appearance: Normal appearance.  HENT:     Head: Normocephalic and atraumatic.  Cardiovascular:     Rate and Rhythm: Normal rate. Rhythm irregular.  Pulmonary:     Effort: Pulmonary effort is normal. No respiratory distress.     Breath sounds: Wheezing (LLL) present. No rales.  Abdominal:     General: There is no distension.     Palpations: Abdomen is soft.     Tenderness: There is no abdominal tenderness.  Musculoskeletal:        General: No tenderness.     Cervical back: Normal range of motion and neck supple.     Right lower leg: No edema.     Left lower leg: No edema.  Skin:    General: Skin is warm and dry.  Neurological:     General: No focal deficit present.     Mental Status: She is alert and oriented to person, place, and time.  Psychiatric:        Mood and Affect: Mood is anxious. Affect is tearful.        Behavior: Behavior normal.    Assessment & Plan:  1: Chronic heart failure with preserved ejection fraction- - NYHA class II - euvolemic today - weighing daily and she was reminded to call for an overnight weight gain of >2 pounds or a weekly weight gain of >5 pounds - weight 199.9 from last visit here 2 months ago - rarely adds salt to her food; reviewed the importance of reading food labels so that she can closely follow a 2000mg  sodium / day - BNP 07/30/2019 was 918.0 - she does not take the flu vaccine; good handwashing emphasized  2: Atrial fibrillation- - saw cardiology Gilford Rile) 10/09/19 - cardioverted 10/17/19 - BMP from 10/15/19 reviewed and showed sodium 141, potassium 4.0, creatinine 0.85 and GFR >60  3: Anxiety- - patient is tearful as she says that her husband recently left and she was wondering what she could take to help her deal with her anxiety over the situation - she says that she is planning on getting established with a PCP; explained that any antidepressant would need to come from a PCP - if she's having  trouble sleeping she could try melatonin or benadryl but did caution about drowsiness - she says that she does have a good support system  4: Tobacco use- - currently smoking 1/4 ppd of cigarettes - says that her resolution is to completely stop the first of 2021 - complete cessation discussed for 3 minutes with her  Patient did not bring her medications nor a list. Each medication was verbally reviewed with the patient and she was encouraged to bring the bottles to every visit to confirm accuracy of list.

## 2019-10-20 ENCOUNTER — Ambulatory Visit: Payer: 59 | Admitting: Family

## 2019-10-20 ENCOUNTER — Telehealth: Payer: Self-pay | Admitting: Family

## 2019-10-20 NOTE — Telephone Encounter (Signed)
Patient did not show for her Heart Failure Clinic appointment on 10/20/19. Will attempt to reschedule.

## 2019-10-24 ENCOUNTER — Encounter: Payer: Self-pay | Admitting: *Deleted

## 2019-10-24 ENCOUNTER — Telehealth: Payer: Self-pay | Admitting: *Deleted

## 2019-10-24 NOTE — Telephone Encounter (Signed)
Left message regarding appointment for cardiac mri scheduled 11/20/19 at 3:00pm at [facility]---arrival time is 2:15pm 1st floor admissions office----will mail information to patient and it is also available in My Chart

## 2019-10-25 NOTE — Progress Notes (Signed)
Patient ID: Hannah Zimmerman, female   DOB: May 27, 1971, 49 y.o.   MRN: [id number] Cardiology Office Note  Date:  10/27/2019   ID:  Hannah Zimmerman, DOB 1970-12-19, MRN [id number]  PCP:  Patient, No Pcp Per   Chief Complaint  Patient presents with  . office visit    Cardioversion. irrregular heart beat. Pt suspects Afib. Meds verbally reviewed w/ pt.    HPI:  Hannah Zimmerman is a very pleasant 49 year old woman with history of  obesity,  Smoking,  previous history of tachycardia,  pulmonary hypertension  Gastric bypass surgery 07/2015, down 130 pounds, stable weight who presents for routine follow-up of her tachycardia and pulmonary hypertension, atrial fibrillation  History of atrial fibrillation, tachy-brady She has completed 2 cardioversions On flecainide with metoprolol Presents today, reports that normal sinus rhythm did not last more than 12 hours following most recent cardioversion  Reports she continues to have symptoms from her atrial fibrillation, general fatigue, leg weakness, shortness of breath on exertion -She would very much like to consider other options such as ablation for her atrial fibrillation  Cardiac MRI planned early March 2021 Was ordered given size of her left atrium and possible need for ablation in the future after discussion with Dr. Caryl Comes  Prior records date back to November 2020, new onset atrial fibrillation with congestive heart failure At that time she had leg swelling, 15 pound weight gain  -Echocardiogram with normal ejection fraction,  severely dilated left atrium Was started on Eliquis in the hospital  aggressive diuresis Goal weight 200 pounds, she was up to 215  She had anticoagulation 4 weeks then cardioversion This did not hold, then started on flecainide  On Lasix 40 twice daily, weight stable  EKG personally reviewed by myself on todays visit Shows atrial fibrillation with ventricular rate 85 bpm ST-T wave abnormality V4 through V6, 1  aVL   PMH:   has a past medical history of (HFpEF) heart failure with preserved ejection fraction ([place]), Complication of anesthesia (20 years ago), Heart murmur, LVH (left ventricular hypertrophy), Morbid obesity ([place]), Persistent atrial fibrillation ([place]), Rash, and Tobacco abuse.  PSH:    Past Surgical History:  Procedure Laterality Date  . ABDOMINAL HYSTERECTOMY    . ANKLE SURGERY     left x 2  . CARDIOVERSION N/A 09/24/2019   Procedure: CARDIOVERSION;  Surgeon: Minna Merritts, MD;  Location: [facility];  Service: Cardiovascular;  Laterality: N/A;  . CARDIOVERSION N/A 10/17/2019   Procedure: CARDIOVERSION;  Surgeon: Minna Merritts, MD;  Location: [facility];  Service: Cardiovascular;  Laterality: N/A;  . CESAREAN SECTION     x 3  . LAPAROSCOPIC GASTRIC SLEEVE RESECTION WITH HIATAL HERNIA REPAIR  08/17/2015   Procedure: LAPAROSCOPIC GASTRIC SLEEVE RESECTION WITH HIATAL HERNIA REPAIR;  Surgeon: Johnathan Hausen, MD;  Location: [facility];  Service: General;;  . TUBAL LIGATION     x 2  . UPPER GI ENDOSCOPY  08/17/2015   Procedure: UPPER GI ENDOSCOPY;  Surgeon: Johnathan Hausen, MD;  Location: [facility];  Service: General;;    Current Outpatient Medications  Medication Sig Dispense Refill  . acetaminophen (TYLENOL) 500 MG tablet Take 1,000 mg by mouth every 6 (six) hours as needed (for headaches.).    Marland Kitchen apixaban (ELIQUIS) 5 MG TABS tablet Take 1 tablet (5 mg total) by mouth 2 (two) times daily. 180 tablet 2  . calcium citrate-vitamin D (CITRACAL+D) 315-200 MG-UNIT tablet Take 1 tablet by mouth daily.     Marland Kitchen  cholecalciferol (VITAMIN D3) 25 MCG (1000 UT) tablet Take 1,000 Units by mouth daily.    . flecainide (TAMBOCOR) 50 MG tablet Take 1 tablet (50 mg total) by mouth 2 (two) times daily. (Patient taking differently: Take 100 mg by mouth 2 (two) times daily. ) 180 tablet 3  . furosemide (LASIX) 40 MG tablet Take 1 tablet (40 mg total) by mouth 2 (two) times daily. 180 tablet 3  . metoprolol  tartrate (LOPRESSOR) 25 MG tablet Take 1 tablet (25 mg total) by mouth 2 (two) times daily. 180 tablet 2  . Multiple Vitamin (MULTIVITAMIN WITH MINERALS) TABS tablet Take 1 tablet by mouth daily.     . potassium chloride SA (KLOR-CON) 20 MEQ tablet Take 1 tablet (20 mEq total) by mouth daily. 90 tablet 2   No current facility-administered medications for this visit.    Allergies:   Codeine and Hydrocodone   Social History:  The patient  reports that she has been smoking cigarettes. She has a 15.50 pack-year smoking history. She has never used smokeless tobacco. She reports current alcohol use of about 1.0 standard drinks of alcohol per week. She reports that she does not use drugs.   Family History:   family history includes Diabetes in her mother; Heart failure in her mother; Hypertension in her mother.    Review of Systems: Review of Systems  Constitutional: Positive for malaise/fatigue.  HENT: Negative.   Respiratory: Positive for shortness of breath.   Cardiovascular: Positive for palpitations.  Gastrointestinal: Negative.   Musculoskeletal: Negative.   Neurological: Negative.   Psychiatric/Behavioral: Negative.   All other systems reviewed and are negative.  PHYSICAL EXAM: VS:  BP 92/62 (BP Location: Left Arm, Patient Position: Sitting, Cuff Size: Large)   Pulse 85   Ht 5\' 4"  (1.626 m)   Wt 203 lb 8 oz (92.3 kg)   SpO2 96%   BMI 34.93 kg/m  , BMI Body mass index is 34.93 kg/m.  Constitutional:  oriented to person, place, and time. No distress.  HENT:  Head: Normocephalic and atraumatic.  Eyes:  no discharge. No scleral icterus.  Neck: Normal range of motion. Neck supple. No JVD present.  Cardiovascular: Irregularly irregular normal heart sounds and intact distal pulses. Exam reveals no gallop and no friction rub. No edema No murmur heard. Pulmonary/Chest: Effort normal and breath sounds normal. No stridor. No respiratory distress.  no wheezes.  no rales.  no  tenderness.  Abdominal: Soft.  no distension.  no tenderness.  Musculoskeletal: Normal range of motion.  no  tenderness or deformity.  Neurological:  normal muscle tone. Coordination normal. No atrophy Skin: Skin is warm and dry. No rash noted. not diaphoretic.  Psychiatric:  normal mood and affect. behavior is normal. Thought content normal.   Recent Labs: 07/30/2019: B Natriuretic Peptide 918.0; TSH 2.226 08/01/2019: ALT 86; Magnesium 2.0 10/15/2019: BUN 18; Creatinine, Ser 0.85; Hemoglobin 15.9; Platelets 275; Potassium 4.0; Sodium 141    Lipid Panel Lab Results  Component Value Date   CHOL 121 07/31/2019   HDL 35 (L) 07/31/2019   LDLCALC 72 07/31/2019   TRIG 68 07/31/2019      Wt Readings from Last 3 Encounters:  10/27/19 203 lb 8 oz (92.3 kg)  10/17/19 199 lb (90.3 kg)  10/09/19 203 lb (92.1 kg)     ASSESSMENT AND PLAN:  Atrial fibrillation, persistent Frustrating situation, has had 2 cardioversions, not holding Most recent cardioversion did not hold more than 12 hours even on metoprolol with  flecainide -Of note is in normal sinus rhythm she has profound bradycardia limiting advancement of her medications Heart rates typically 40 Size of left atrium previously reported to be severely dilated on echo, cardiac MRI pending the annual March 2021 -She is very eager to consider ablation of her atrial fibrillation given her age and symptoms from atrial fibrillation -Discussed with her, will place referral to EP for consideration of ablation  Smoking Smoking cessation recommended  Morbid obesity History of gastric bypass surgery, over 100 pound weight loss, Weight stable, goal weight 200 pounds  Pulmonary hypertension/diastolic CHF Exacerbated by atrial fibrillation, recommend she stay on Lasix 40 twice daily  Disposition:   F/U 6 month   Total encounter time more than 25 minutes  Greater than 50% was spent in counseling and coordination of care with the  patient   Orders Placed This Encounter  Procedures  . Ambulatory referral to Cardiac Electrophysiology  . EKG 12-Lead     Signed, Esmond Plants, M.D., Ph.D. 10/27/2019  [place], [place]

## 2019-10-27 ENCOUNTER — Encounter: Payer: Self-pay | Admitting: Cardiovascular Disease

## 2019-10-27 ENCOUNTER — Ambulatory Visit (INDEPENDENT_AMBULATORY_CARE_PROVIDER_SITE_OTHER): Payer: Managed Care, Other (non HMO) | Admitting: Cardiovascular Disease

## 2019-10-27 ENCOUNTER — Other Ambulatory Visit: Payer: Self-pay

## 2019-10-27 VITALS — BP 92/62 | HR 85 | Ht 64.0 in | Wt 203.5 lb

## 2019-10-27 DIAGNOSIS — F419 Anxiety disorder, unspecified: Secondary | ICD-10-CM

## 2019-10-27 DIAGNOSIS — Z720 Tobacco use: Secondary | ICD-10-CM | POA: Diagnosis not present

## 2019-10-27 DIAGNOSIS — I5032 Chronic diastolic (congestive) heart failure: Secondary | ICD-10-CM | POA: Diagnosis not present

## 2019-10-27 DIAGNOSIS — I4819 Other persistent atrial fibrillation: Secondary | ICD-10-CM

## 2019-10-27 DIAGNOSIS — I272 Pulmonary hypertension, unspecified: Secondary | ICD-10-CM

## 2019-10-27 NOTE — Patient Instructions (Addendum)
We will place a referral to EP/[facility] for atrial fib ablation   Medication Instructions:  No changes  If you need a refill on your cardiac medications before your next appointment, please call your pharmacy.    Lab work: No new labs needed   If you have labs (blood work) drawn today and your tests are completely normal, you will receive your results only by: Marland Kitchen MyChart Message (if you have MyChart) OR . A paper copy in the mail If you have any lab test that is abnormal or we need to change your treatment, we will call you to review the results.   Testing/Procedures: No new testing needed   Follow-Up: At [facility], you and your health needs are our priority.  As part of our continuing mission to provide you with exceptional heart care, we have created designated Provider Care Teams.  These Care Teams include your primary Cardiologist (physician) and Advanced Practice Providers (APPs -  Physician Assistants and Nurse Practitioners) who all work together to provide you with the care you need, when you need it.  . You will need a follow up appointment in 3 months   . Providers on your designated Care Team:   . Murray Hodgkins, NP . Christell Faith, PA-C . Marrianne Mood, PA-C  Any Other Special Instructions Will Be Listed Below (If Applicable).  For educational health videos Log in to : [email or web address] Or : [email or web address], password : [email or web address]

## 2019-11-04 ENCOUNTER — Ambulatory Visit (INDEPENDENT_AMBULATORY_CARE_PROVIDER_SITE_OTHER): Payer: Managed Care, Other (non HMO) | Admitting: Cardiology

## 2019-11-04 ENCOUNTER — Other Ambulatory Visit: Payer: Self-pay

## 2019-11-04 ENCOUNTER — Encounter: Payer: Self-pay | Admitting: Cardiology

## 2019-11-04 VITALS — BP 90/64 | HR 80 | Ht 64.0 in | Wt 202.0 lb

## 2019-11-04 DIAGNOSIS — Z01812 Encounter for preprocedural laboratory examination: Secondary | ICD-10-CM

## 2019-11-04 DIAGNOSIS — I4819 Other persistent atrial fibrillation: Secondary | ICD-10-CM

## 2019-11-04 NOTE — Patient Instructions (Addendum)
Medication Instructions:  Your physician recommends that you continue on your current medications as directed. Please refer to the Current Medication list given to you today.  *If you need a refill on your cardiac medications before your next appointment, please call your pharmacy*  Lab Work: Go by [facility] between: 3/22 - 3/26 for pre procedure lab work. If you have labs (blood work) drawn today and your tests are completely normal, you will receive your results only by: Marland Kitchen MyChart Message (if you have MyChart) OR . A paper copy in the mail If you have any lab test that is abnormal or we need to change your treatment, we will call you to review the results.  Testing/Procedures: Your physician has requested that you have cardiac CT within 7 days prior to your ablation. Cardiac computed tomography (CT) is a painless test that uses an x-ray machine to take clear, detailed pictures of your heart. For further information please visit HugeFiesta.tn. Please follow instruction sheet as given.   Your physician has recommended that you have an ablation. Catheter ablation is a medical procedure used to treat some cardiac arrhythmias (irregular heartbeats). During catheter ablation, a long, thin, flexible tube is put into a blood vessel in your groin (upper thigh), or neck. This tube is called an ablation catheter. It is then guided to your heart through the blood vessel. Radio frequency waves destroy small areas of heart tissue where abnormal heartbeats may cause an arrhythmia to start. Please see the instruction sheet given to you today.    Follow-Up: At [facility], you and your health needs are our priority.  As part of our continuing mission to provide you with exceptional heart care, we have created designated Provider Care Teams.  These Care Teams include your primary Cardiologist (physician) and Advanced Practice Providers (APPs -  Physician Assistants and Nurse Practitioners) who all work  together to provide you with the care you need, when you need it.  Your next appointment:   To be determined once procedure date is scheduled.  Other Instructions  CT INSTRUCTIONS Your cardiac CT will be scheduled at:  [facility] [address] [place], [place] [address] [phone]  Please arrive at the [facility] main entrance of [facility] on ___________ @ ___________, please 30 minutes prior to test start time. Proceed to the [facility] Radiology Department (first floor) to check-in and test prep.  Please follow these instructions carefully (unless otherwise directed):  On the Night Before the Test: . Be sure to Drink plenty of water. . Do not consume any caffeinated/decaffeinated beverages or chocolate 12 hours prior to your test. . Do not take any antihistamines 12 hours prior to your test.  On the Day of the Test: . Drink plenty of water. Do not drink any water within one hour of the test. . Do not eat any food 4 hours prior to the test. . You may take your regular medications prior to the test.  . Take metoprolol (Lopressor) two hours prior to test. . HOLD Furosemide/Hydrochlorothiazide morning of the test. . FEMALES- please wear underwire-free bra if available      After the Test: . Drink plenty of water. . After receiving IV contrast, you may experience a mild flushed feeling. This is normal. . On occasion, you may experience a mild rash up to 24 hours after the test. This is not dangerous. If this occurs, you can take Benadryl 25 mg and increase your fluid intake. . If  you experience trouble breathing, this can be serious. If it is severe call 911 IMMEDIATELY. If it is mild, please call our office.   Once we have confirmed authorization from your insurance company, we will call you to set up a date and time for your test.   For non-scheduling related questions, please contact the cardiac imaging nurse navigator should you have any  questions/concerns: Marchia Bond, RN Navigator Cardiac Imaging [name] [phone] mobile     Electrophysiology/Ablation Procedure Instructions   You are scheduled for a(n)  ablation on 12/24/19 with Dr. Allegra Lai.   1.   Pre procedure testing-             A.  LAB WORK --- Between 3/22 - 3/26  for your pre procedure blood work.                 B. COVID TEST-- On 12/22/2019 @ 9:00 am at the [facility].  Stay in your car and the nurse team will come to your car to test you.  After you are tested please go home and self quarantine until the day of your procedure.     2. On the day of your procedure 12/24/19 you will go to [facility] hospital ([address]) at 6:30 am.  Dennis Bast will go to the main entrance A [employer]) and enter where the [employer] are.  Your driver will drop you off and you will head down the hallway to ADMITTING.  You may have one support person come in to the hospital with you.  They will be asked to wait in the waiting room.   3.   Do not eat or drink after midnight prior to your procedure.   4.   Do NOT take any medications the morning of your procedure.   5.  Plan for an overnight stay.  If you use your phone frequently bring your phone charger.   6. You will follow up with the AFIB clinic 4 weeks after your procedure.  You will follow up with Dr. Curt Bears  3 months after your procedure.  These appointments will be made for you.   * If you have ANY questions please call the office [phone] and ask for Marcus Groll RN or send me a MyChart message   * Occasionally, EP Studies and ablations can become lengthy.  Please make your family aware of this before your procedure starts.  Average time ranges from 2-8 hours for EP studies/ablations.  Your physician will call your family after the procedure with the results.          Cardiac Ablation Cardiac ablation is a procedure to disable (ablate) a small amount of heart tissue  in very specific places. The heart has many electrical connections. Sometimes these connections are abnormal and can cause the heart to beat very fast or irregularly. Ablating some of the problem areas can improve the heart rhythm or return it to normal. Ablation may be done for people who:  Have Wolff-Parkinson-White syndrome.  Have fast heart rhythms (tachycardia).  Have taken medicines for an abnormal heart rhythm (arrhythmia) that were not effective or caused side effects.  Have a high-risk heartbeat that may be life-threatening. During the procedure, a small incision is made in the neck or the groin, and a long, thin, flexible tube (catheter) is inserted into the incision and moved to the heart. Small devices (electrodes) on the tip of the catheter will  send out electrical currents. A type of X-ray (fluoroscopy) will be used to help guide the catheter and to provide images of the heart. Tell a health care provider about:  Any allergies you have.  All medicines you are taking, including vitamins, herbs, eye drops, creams, and over-the-counter medicines.  Any problems you or family members have had with anesthetic medicines.  Any blood disorders you have.  Any surgeries you have had.  Any medical conditions you have, such as kidney failure.  Whether you are pregnant or may be pregnant. What are the risks? Generally, this is a safe procedure. However, problems may occur, including:  Infection.  Bruising and bleeding at the catheter insertion site.  Bleeding into the chest, especially into the sac that surrounds the heart. This is a serious complication.  Stroke or blood clots.  Damage to other structures or organs.  Allergic reaction to medicines or dyes.  Need for a permanent pacemaker if the normal electrical system is damaged. A pacemaker is a small computer that sends electrical signals to the heart and helps your heart beat normally.  The procedure not being fully  effective. This may not be recognized until months later. Repeat ablation procedures are sometimes required. What happens before the procedure?  Follow instructions from your health care provider about eating or drinking restrictions.  Ask your health care provider about: ? Changing or stopping your regular medicines. This is especially important if you are taking diabetes medicines or blood thinners. ? Taking medicines such as aspirin and ibuprofen. These medicines can thin your blood. Do not take these medicines before your procedure if your health care provider instructs you not to.  Plan to have someone take you home from the hospital or clinic.  If you will be going home right after the procedure, plan to have someone with you for 24 hours. What happens during the procedure?  To lower your risk of infection: ? Your health care team will wash or sanitize their hands. ? Your skin will be washed with soap. ? Hair may be removed from the incision area.  An IV tube will be inserted into one of your veins.  You will be given a medicine to help you relax (sedative).  The skin on your neck or groin will be numbed.  An incision will be made in your neck or your groin.  A needle will be inserted through the incision and into a large vein in your neck or groin.  A catheter will be inserted into the needle and moved to your heart.  Dye may be injected through the catheter to help your surgeon see the area of the heart that needs treatment.  Electrical currents will be sent from the catheter to ablate heart tissue in desired areas. There are three types of energy that may be used to ablate heart tissue: ? Heat (radiofrequency energy). ? Laser energy. ? Extreme cold (cryoablation).  When the necessary tissue has been ablated, the catheter will be removed.  Pressure will be held on the catheter insertion area to prevent excessive bleeding.  A bandage (dressing) will be placed over the  catheter insertion area. The procedure may vary among health care providers and hospitals. What happens after the procedure?  Your blood pressure, heart rate, breathing rate, and blood oxygen level will be monitored until the medicines you were given have worn off.  Your catheter insertion area will be monitored for bleeding. You will need to lie still for a few hours  to ensure that you do not bleed from the catheter insertion area.  Do not drive for 24 hours or as long as directed by your health care provider. Summary  Cardiac ablation is a procedure to disable (ablate) a small amount of heart tissue in very specific places. Ablating some of the problem areas can improve the heart rhythm or return it to normal.  During the procedure, electrical currents will be sent from the catheter to ablate heart tissue in desired areas. This information is not intended to replace advice given to you by your health care provider. Make sure you discuss any questions you have with your health care provider. Document Revised: 02/25/2018 Document Reviewed: 07/24/2016 Elsevier Patient Education  [place].

## 2019-11-04 NOTE — Progress Notes (Signed)
Electrophysiology Office Note   Date:  11/04/2019   ID:  TAETUM ARRATIA, DOB [birth year], MRN [id number]  PCP:  Patient, No Pcp Per  Cardiologist:  Rockey Situ Primary Electrophysiologist:  Dvora Buitron Meredith Leeds, MD    Chief Complaint: AF   History of Present Illness: OSMARY SCHLEIFER is a 49 y.o. female who is being seen today for the evaluation of AF at the request of Gollan, Kathlene November, MD. Presenting today for electrophysiology evaluation.  She has a history significant for heart failure with preserved ejection fraction, morbid obesity status post gastric bypass, persistent atrial fibrillation, and tobacco abuse.  Her symptoms from atrial fibrillation include generalized fatigue, leg weakness, and dyspnea on exertion.  He has had 2 cardioversions in the past, but neither of them lasted long.  Her left atrium is severely dilated and she has a cardiac MRI to further evaluate this.  She states that she is gotten quite frustrated with her atrial fibrillation.  She is fatigued and tired all the time which is gotten in the way of her daily activities.  Today, she denies symptoms of palpitations, chest pain, shortness of breath, orthopnea, PND, lower extremity edema, claudication, dizziness, presyncope, syncope, bleeding, or neurologic sequela. The patient is tolerating medications without difficulties.    Past Medical History:  Diagnosis Date  . (HFpEF) heart failure with preserved ejection fraction ([place])    a. 07/2019 Echo: EF 60-65%, mod LVH. Sev dil LA, mildly dil RA. Mod elev PASP.  Marland Kitchen Complication of anesthesia 20 years ago   a. spinal with first c section went too high stopped breathing, low bp with 2nd c section, no further issues with anesthesia  . Heart murmur    a. 07/2019 Echo: no significant valvular dzs.  Marland Kitchen LVH (left ventricular hypertrophy)    a. 07/2019 Echo: Mod LVH.  . Morbid obesity ([place])    a. 09/2014 s/p gastric bypass.  . Persistent atrial fibrillation ([place])    a. Dx  07/2019-->CHA2DS2VASc = 2 (diast CHF/Fem)-->Eliquis; b. 09/2018 s/p DCCV (150J (biphasic) x 1).  . Rash    both arms from old bed bug bites, healing  . Tobacco abuse    Past Surgical History:  Procedure Laterality Date  . ABDOMINAL HYSTERECTOMY    . ANKLE SURGERY     left x 2  . CARDIOVERSION N/A 09/24/2019   Procedure: CARDIOVERSION;  Surgeon: Minna Merritts, MD;  Location: [facility] ORS;  Service: Cardiovascular;  Laterality: N/A;  . CARDIOVERSION N/A 10/17/2019   Procedure: CARDIOVERSION;  Surgeon: Minna Merritts, MD;  Location: [facility] ORS;  Service: Cardiovascular;  Laterality: N/A;  . CESAREAN SECTION     x 3  . LAPAROSCOPIC GASTRIC SLEEVE RESECTION WITH HIATAL HERNIA REPAIR  08/17/2015   Procedure: LAPAROSCOPIC GASTRIC SLEEVE RESECTION WITH HIATAL HERNIA REPAIR;  Surgeon: Johnathan Hausen, MD;  Location: [facility] ORS;  Service: General;;  . TUBAL LIGATION     x 2  . UPPER GI ENDOSCOPY  08/17/2015   Procedure: UPPER GI ENDOSCOPY;  Surgeon: Johnathan Hausen, MD;  Location: [facility] ORS;  Service: General;;     Current Outpatient Medications  Medication Sig Dispense Refill  . acetaminophen (TYLENOL) 500 MG tablet Take 1,000 mg by mouth every 6 (six) hours as needed (for headaches.).    Marland Kitchen apixaban (ELIQUIS) 5 MG TABS tablet Take 1 tablet (5 mg total) by mouth 2 (two) times daily. 180 tablet 2  . calcium citrate-vitamin D (CITRACAL+D) 315-200 MG-UNIT tablet Take 1 tablet by mouth daily.     Marland Kitchen  cholecalciferol (VITAMIN D3) 25 MCG (1000 UT) tablet Take 1,000 Units by mouth daily.    . flecainide (TAMBOCOR) 100 MG tablet Take 100 mg by mouth 2 (two) times daily.    . furosemide (LASIX) 40 MG tablet Take 1 tablet (40 mg total) by mouth 2 (two) times daily. 180 tablet 3  . metoprolol tartrate (LOPRESSOR) 25 MG tablet Take 1 tablet (25 mg total) by mouth 2 (two) times daily. 180 tablet 2  . Multiple Vitamin (MULTIVITAMIN WITH MINERALS) TABS tablet Take 1 tablet by mouth daily.     . potassium chloride SA  (KLOR-CON) 20 MEQ tablet Take 1 tablet (20 mEq total) by mouth daily. 90 tablet 2   No current facility-administered medications for this visit.    Allergies:   Codeine and Hydrocodone   Social History:  The patient  reports that she has been smoking cigarettes. She has a 15.50 pack-year smoking history. She has never used smokeless tobacco. She reports current alcohol use of about 1.0 standard drinks of alcohol per week. She reports that she does not use drugs.   Family History:  The patient's family history includes Diabetes in her mother; Heart failure in her mother; Hypertension in her mother.    ROS:  Please see the history of present illness.   Otherwise, review of systems is positive for none.   All other systems are reviewed and negative.    PHYSICAL EXAM: VS:  BP 90/64   Pulse 80   Ht 5\' 4"  (1.626 m)   Wt 202 lb (91.6 kg)   BMI 34.67 kg/m  , BMI Body mass index is 34.67 kg/m. GEN: Well nourished, well developed, in no acute distress  HEENT: normal  Neck: no JVD, carotid bruits, or masses Cardiac: irregular; no murmurs, rubs, or gallops,no edema  Respiratory:  clear to auscultation bilaterally, normal work of breathing GI: soft, nontender, nondistended, + BS MS: no deformity or atrophy  Skin: warm and dry Neuro:  Strength and sensation are intact Psych: euthymic mood, full affect  EKG:  EKG is not ordered today. Personal review of the ekg ordered 10/27/19 shows AF, rate 85  Recent Labs: 07/30/2019: B Natriuretic Peptide 918.0; TSH 2.226 08/01/2019: ALT 86; Magnesium 2.0 10/15/2019: BUN 18; Creatinine, Ser 0.85; Hemoglobin 15.9; Platelets 275; Potassium 4.0; Sodium 141    Lipid Panel     Component Value Date/Time   CHOL 121 07/31/2019 0559   TRIG 68 07/31/2019 0559   HDL 35 (L) 07/31/2019 0559   CHOLHDL 3.5 07/31/2019 0559   VLDL 14 07/31/2019 0559   LDLCALC 72 07/31/2019 0559     Wt Readings from Last 3 Encounters:  11/04/19 202 lb (91.6 kg)  10/27/19 203  lb 8 oz (92.3 kg)  10/17/19 199 lb (90.3 kg)      Other studies Reviewed: Additional studies/ records that were reviewed today include: TTE 07/31/19  Review of the above records today demonstrates:  1. Left ventricular ejection fraction, by visual estimation, is 60 to  65%. The left ventricle has normal function. There is moderately increased  left ventricular hypertrophy.  2. Left ventricular diastolic parameters are indeterminate.  3. Global right ventricle has normal systolic function.The right  ventricular size is normal. No increase in right ventricular wall  thickness.  4. Left atrial size was severely dilated.  5. Right atrial size was mildly dilated  6. Moderately elevated pulmonary artery systolic pressure.  7. Rhythm is atrial fibrillation.    ASSESSMENT AND PLAN:  1.  Persistent atrial fibrillation: Currently on Eliquis, flecainide, metoprolol.  She has had 2 cardioversions.  CHA2DS2-VASc of 1.  She is quite symptomatic with her atrial fibrillation and would prefer ablation.  Risks and benefits were discussed which include bleeding, tamponade, infection, heart block, stroke, pneumothorax.  She understands these risks and is agreed to the procedure.  We Kimmie Doren review her cardiac MRI prior to her procedure.  2.  Tobacco abuse: Smoking cessation recommended  3.  Morbid obesity: Goal weight of 200 pounds.  Diet and exercise encouraged  Case discussed with referring cardiologist  Current medicines are reviewed at length with the patient today.   The patient does not have concerns regarding her medicines.  The following changes were made today:  none  Labs/ tests ordered today include:  Orders Placed This Encounter  Procedures  . CT CARDIAC MORPH/PULM VEIN W/CM&W/O CA SCORE  . CT CORONARY FRACTIONAL FLOW RESERVE DATA PREP  . CT CORONARY FRACTIONAL FLOW RESERVE FLUID ANALYSIS  . Basic metabolic panel  . CBC     Disposition:   FU with Kenasia Scheller 3  months  Signed, Deshondra Worst Meredith Leeds, MD  11/04/2019 10:37 AM     [facility] [address] [place] [address] [phone] (office) [phone] (fax)

## 2019-11-19 ENCOUNTER — Telehealth (HOSPITAL_COMMUNITY): Payer: Self-pay | Admitting: Emergency Medicine

## 2019-11-19 ENCOUNTER — Encounter (HOSPITAL_COMMUNITY): Payer: Self-pay

## 2019-11-19 NOTE — Telephone Encounter (Signed)
Reaching out to patient to offer assistance regarding upcoming cardiac imaging study; pt verbalizes understanding of appt date/time, parking situation and where to check in, and verified current allergies; name and call back number provided for further questions should they arise Hannah Bond RN Navigator Cardiac Imaging [facility] [phone] office [phone] cell   Pt denies implants, denies claustro, denies diabetes supplies

## 2019-11-20 ENCOUNTER — Telehealth (HOSPITAL_COMMUNITY): Payer: Self-pay | Admitting: Emergency Medicine

## 2019-11-20 ENCOUNTER — Ambulatory Visit (HOSPITAL_COMMUNITY): Admission: RE | Admit: 2019-11-20 | Payer: Managed Care, Other (non HMO) | Source: Ambulatory Visit

## 2019-11-20 NOTE — Telephone Encounter (Signed)
Informed by MR tech that the order placed for this patient and indication is wrong. She needs contrast and the study ordered is without contrast.   A new order needs placed and auth'd and we will r/s patient.   I attempted to call patient and inform her that her appt is cancelled however she didn't answer so I left detailed message on VM.   Marchia Bond RN Navigator Cardiac Imaging [facility] [phone] Office  [phone] Cell

## 2019-11-20 NOTE — Addendum Note (Signed)
Addended by: Loel Dubonnet on: 11/20/2019 02:16 PM   Modules accepted: Orders

## 2019-11-24 ENCOUNTER — Other Ambulatory Visit: Payer: Self-pay | Admitting: Cardiovascular Disease

## 2019-11-24 MED ORDER — FLECAINIDE ACETATE 100 MG PO TABS: 100.0000 mg | ORAL_TABLET | Freq: Two times a day (BID) | ORAL | 3 refills | Status: DC

## 2019-11-24 NOTE — Telephone Encounter (Signed)
Please review for refill.  Medication change was made by Dr. Curt Bears:   Medication Changes   Flecainide Acetate    (Error)  Patient taking differently:  Take 100 mg by mouth 2 (two) times daily.   100 mg Oral 2 times daily

## 2019-11-24 NOTE — Telephone Encounter (Signed)
°*  STAT* If patient is at the pharmacy, call can be transferred to refill team.   1. Which medications need to be refilled? (please list name of each medication and dose if known)  flecainide (TAMBOCOR) 100 MG 2 times daily   2. Which pharmacy/location (including street and city if local pharmacy) is medication to be sent to? [facility] on [address] and [address]  3. Do they need a 30 day or 90 day supply? 90 day

## 2019-11-27 ENCOUNTER — Telehealth: Payer: Self-pay

## 2019-11-27 MED ORDER — APIXABAN 5 MG PO TABS: 5.0000 mg | ORAL_TABLET | Freq: Two times a day (BID) | ORAL | 1 refills | Status: DC

## 2019-11-27 NOTE — Addendum Note (Signed)
Addended by: Brynda Peon on: 11/27/2019 03:48 PM   Modules accepted: Orders

## 2019-11-27 NOTE — Telephone Encounter (Signed)
Incoming fax from [facility] a refill for Eliquis 5MG 

## 2019-11-27 NOTE — Telephone Encounter (Signed)
Pt last saw Dr Curt Bears 11/04/19, last labs 10/15/19 Creat 0.85, age 49, weight 91.6kg, based on specified criteria pt is on appropriate dosage of Eliquis 5mg  BID.  Will refill rx.

## 2019-12-18 ENCOUNTER — Encounter (HOSPITAL_COMMUNITY): Payer: Self-pay

## 2019-12-19 ENCOUNTER — Other Ambulatory Visit: Payer: Self-pay

## 2019-12-19 ENCOUNTER — Ambulatory Visit (HOSPITAL_COMMUNITY)
Admission: RE | Admit: 2019-12-19 | Discharge: 2019-12-19 | Disposition: A | Discharge status: Home / Self Care | Payer: Managed Care, Other (non HMO) | Source: Ambulatory Visit | Attending: Cardiology | Admitting: Cardiology

## 2019-12-19 DIAGNOSIS — I4819 Other persistent atrial fibrillation: Secondary | ICD-10-CM | POA: Insufficient documentation

## 2019-12-19 MED ORDER — METOPROLOL TARTRATE 5 MG/5ML IV SOLN
INTRAVENOUS | Status: AC
  Filled 2019-12-19: qty 5

## 2019-12-19 MED ORDER — IOHEXOL 350 MG/ML SOLN
80.0000 mL | Freq: Once | INTRAVENOUS | Status: AC | PRN
  Administered 2019-12-19: 80 mL via INTRAVENOUS

## 2019-12-22 ENCOUNTER — Other Ambulatory Visit: Payer: Self-pay

## 2019-12-22 ENCOUNTER — Other Ambulatory Visit
Admission: RE | Admit: 2019-12-22 | Discharge: 2019-12-22 | Disposition: A | Discharge status: Home / Self Care | Payer: Managed Care, Other (non HMO) | Source: Ambulatory Visit | Attending: Cardiology | Admitting: Cardiology

## 2019-12-22 DIAGNOSIS — Z20822 Contact with and (suspected) exposure to covid-19: Secondary | ICD-10-CM | POA: Diagnosis not present

## 2019-12-22 DIAGNOSIS — Z01812 Encounter for preprocedural laboratory examination: Secondary | ICD-10-CM | POA: Insufficient documentation

## 2019-12-22 LAB — SARS CORONAVIRUS 2 (TAT 6-24 HRS): SARS Coronavirus 2: NEGATIVE

## 2019-12-23 ENCOUNTER — Telehealth (INDEPENDENT_AMBULATORY_CARE_PROVIDER_SITE_OTHER): Payer: Managed Care, Other (non HMO) | Admitting: Cardiology

## 2019-12-23 ENCOUNTER — Telehealth: Payer: Self-pay | Admitting: Cardiology

## 2019-12-23 DIAGNOSIS — I4891 Unspecified atrial fibrillation: Secondary | ICD-10-CM | POA: Diagnosis not present

## 2019-12-23 LAB — BASIC METABOLIC PANEL
BUN/Creatinine Ratio: 14 (ref 9–23)
BUN: 14 mg/dL (ref 6–24)
CO2: 26 mmol/L (ref 20–29)
Calcium: 9.3 mg/dL (ref 8.7–10.2)
Chloride: 99 mmol/L (ref 96–106)
Creatinine, Ser: 0.97 mg/dL (ref 0.57–1.00)
GFR calc Af Amer: 80 mL/min/{1.73_m2} (ref >59)
GFR calc non Af Amer: 69 mL/min/{1.73_m2} (ref >59)
Glucose: 86 mg/dL (ref 65–99)
Potassium: 3.8 mmol/L (ref 3.5–5.2)
Sodium: 142 mmol/L (ref 134–144)

## 2019-12-23 LAB — CBC
Hematocrit: 49.4 % — ABNORMAL HIGH (ref 34.0–46.6)
Hemoglobin: 17.5 g/dL — ABNORMAL HIGH (ref 11.1–15.9)
MCH: 35.8 pg — ABNORMAL HIGH (ref 26.6–33.0)
MCHC: 35.4 g/dL (ref 31.5–35.7)
MCV: 101 fL — ABNORMAL HIGH (ref 79–97)
Platelets: 274 10*3/uL (ref 150–450)
RBC: 4.89 x10E6/uL (ref 3.77–5.28)
RDW: 14.8 % (ref 11.7–15.4)
WBC: 9 10*3/uL (ref 3.4–10.8)

## 2019-12-23 NOTE — Progress Notes (Signed)
Electrophysiology TeleHealth Note   Due to national recommendations of social distancing due to COVID 19, an audio/video telehealth visit is felt to be most appropriate for this patient at this time.  See Epic message for the patient's consent to telehealth for [facility].   Date:  12/23/2019   ID:  Hannah Zimmerman, DOB [birth year], MRN [id number]  Location: patient's home  Provider location: [address] [place]  Evaluation Performed: Follow-up visit  PCP:  Patient, No Pcp Per  Cardiologist:  [name], MD  Electrophysiologist:  Dr Curt Bears  Chief Complaint: Atrial fibrillation  History of Present Illness:    Hannah Zimmerman is a 49 y.o. female who presents via audio/video conferencing for a telehealth visit today.  Since last being seen in our clinic, the patient reports doing very well.  Today, she denies symptoms of palpitations, chest pain, shortness of breath,  lower extremity edema, dizziness, presyncope, or syncope.  The patient is otherwise without complaint today.  The patient denies symptoms of fevers, chills, cough, or new SOB worrisome for COVID 19.  She has a history of diastolic heart failure, LVH, morbid obesity, and persistent atrial fibrillation.  She has plans for atrial fibrillation ablation tomorrow.  She had a preablation CT scan that showed evidence of hypertrophic cardiomyopathy.  Today, denies symptoms of chest pain,  orthopnea, PND, lower extremity edema, claudication, dizziness, presyncope, syncope, bleeding, or neurologic sequela. The patient is tolerating medications without difficulties.  Unfortunately, she remains in atrial fibrillation.  She has been compliant with her anticoagulation.  She continues to have palpitations and variable heart rates, up into the low 100s.  Past Medical History:  Diagnosis Date  . (HFpEF) heart failure with preserved ejection fraction ([place])    a. 07/2019 Echo: EF 60-65%, mod LVH. Sev dil LA, mildly  dil RA. Mod elev PASP.  Marland Kitchen Complication of anesthesia 20 years ago   a. spinal with first c section went too high stopped breathing, low bp with 2nd c section, no further issues with anesthesia  . Heart murmur    a. 07/2019 Echo: no significant valvular dzs.  Marland Kitchen LVH (left ventricular hypertrophy)    a. 07/2019 Echo: Mod LVH.  . Morbid obesity ([place])    a. 09/2014 s/p gastric bypass.  . Persistent atrial fibrillation ([place])    a. Dx 07/2019-->CHA2DS2VASc = 2 (diast CHF/Fem)-->Eliquis; b. 09/2018 s/p DCCV (150J (biphasic) x 1).  . Rash    both arms from old bed bug bites, healing  . Tobacco abuse     Past Surgical History:  Procedure Laterality Date  . ABDOMINAL HYSTERECTOMY    . ANKLE SURGERY     left x 2  . CARDIOVERSION N/A 09/24/2019   Procedure: CARDIOVERSION;  Surgeon: Minna Merritts, MD;  Location: [facility] ORS;  Service: Cardiovascular;  Laterality: N/A;  . CARDIOVERSION N/A 10/17/2019   Procedure: CARDIOVERSION;  Surgeon: Minna Merritts, MD;  Location: [facility] ORS;  Service: Cardiovascular;  Laterality: N/A;  . CESAREAN SECTION     x 3  . LAPAROSCOPIC GASTRIC SLEEVE RESECTION WITH HIATAL HERNIA REPAIR  08/17/2015   Procedure: LAPAROSCOPIC GASTRIC SLEEVE RESECTION WITH HIATAL HERNIA REPAIR;  Surgeon: Johnathan Hausen, MD;  Location: [facility] ORS;  Service: General;;  . TUBAL LIGATION     x 2  . UPPER GI ENDOSCOPY  08/17/2015   Procedure: UPPER GI ENDOSCOPY;  Surgeon: Johnathan Hausen, MD;  Location: [facility] ORS;  Service: General;;    Current Outpatient  Medications  Medication Sig Dispense Refill  . acetaminophen (TYLENOL) 500 MG tablet Take 1,000 mg by mouth every 6 (six) hours as needed (for headaches.).    Marland Kitchen apixaban (ELIQUIS) 5 MG TABS tablet Take 1 tablet (5 mg total) by mouth 2 (two) times daily. 180 tablet 1  . flecainide (TAMBOCOR) 100 MG tablet Take 1 tablet (100 mg total) by mouth 2 (two) times daily. 180 tablet 3  . furosemide (LASIX) 40 MG tablet Take 1 tablet (40 mg total) by mouth  2 (two) times daily. 180 tablet 3  . metoprolol tartrate (LOPRESSOR) 25 MG tablet Take 1 tablet (25 mg total) by mouth 2 (two) times daily. 180 tablet 2  . potassium chloride SA (KLOR-CON) 20 MEQ tablet Take 1 tablet (20 mEq total) by mouth daily. 90 tablet 2   No current facility-administered medications for this visit.    Allergies:   Codeine and Hydrocodone   Social History:  The patient  reports that she has been smoking cigarettes. She has a 15.50 pack-year smoking history. She has never used smokeless tobacco. She reports current alcohol use of about 1.0 standard drinks of alcohol per week. She reports that she does not use drugs.   Family History:  The patient's  family history includes Diabetes in her mother; Heart failure in her mother; Hypertension in her mother.   ROS:  Please see the history of present illness.   All other systems are personally reviewed and negative.    Exam:    Vital Signs:  There were no vitals taken for this visit.  no acute distress, no shortness of breath.  Labs/Other Tests and Data Reviewed:    Recent Labs: 07/30/2019: B Natriuretic Peptide 918.0; TSH 2.226 08/01/2019: ALT 86; Magnesium 2.0 12/22/2019: BUN 14; Creatinine, Ser 0.97; Hemoglobin 17.5; Platelets 274; Potassium 3.8; Sodium 142   Wt Readings from Last 3 Encounters:  11/04/19 202 lb (91.6 kg)  10/27/19 203 lb 8 oz (92.3 kg)  10/17/19 199 lb (90.3 kg)     Other studies personally reviewed: Additional studies/ records that were reviewed today include: ECG 10/27/2019 personally reviewed Review of the above records today demonstrates: Atrial fibrillation    ASSESSMENT & PLAN:    1.  Persistent atrial fibrillation: Patient currently on Eliquis and flecainide.  Despite this she remains in atrial fibrillation.  We Adit Riddles plan for ablation.  Risks and benefits were discussed which include bleeding, tamponade, heart block, stroke, damage surrounding organs.  She understand these risks and has  agreed to the procedure.  2.  Hypertrophic cardiomyopathy: Diagnosed on CT scan preablation.  We Sharita Bienaime plan for MRI for further evaluation as an outpatient.   COVID 19 screen The patient denies symptoms of COVID 19 at this time.  The importance of social distancing was discussed today.  Follow-up: 3 months   Current medicines are reviewed at length with the patient today.   The patient does not have concerns regarding her medicines.  The following changes were made today:  none  Labs/ tests ordered today include:  No orders of the defined types were placed in this encounter.    Patient Risk:  after full review of this patients clinical status, I feel that they are at moderate risk at this time.  Today, I have spent 9 minutes with the patient with telehealth technology discussing AF .    Signed, Hannah Zimmerman Meredith Leeds, MD  12/23/2019 9:59 AM     [facility] [address]  [place] [address] [phone] (office) [phone] (fax)

## 2019-12-23 NOTE — Progress Notes (Signed)
Attempted to call patient regarding procedure scheduled for tomorrow.  No answer

## 2019-12-23 NOTE — Telephone Encounter (Signed)
New message  Patient states that she is returning a call to [facility]. Please give patient a call back.

## 2019-12-23 NOTE — Telephone Encounter (Signed)
Pt scheduled for virtual visit w/ Camnitz at 9:15 this morning for H&P for ablation procedure tomorrow. Pt agreeable to plan.

## 2019-12-24 ENCOUNTER — Encounter (HOSPITAL_COMMUNITY)
Admission: RE | Disposition: A | Discharge status: Home / Self Care | Payer: Managed Care, Other (non HMO) | Source: Home / Self Care | Attending: Cardiology

## 2019-12-24 ENCOUNTER — Ambulatory Visit (HOSPITAL_COMMUNITY)
Admission: RE | Admit: 2019-12-24 | Discharge: 2019-12-24 | Disposition: A | Discharge status: Home / Self Care | Payer: Managed Care, Other (non HMO) | Attending: Cardiology | Admitting: Cardiology

## 2019-12-24 ENCOUNTER — Ambulatory Visit (HOSPITAL_COMMUNITY): Payer: Managed Care, Other (non HMO) | Admitting: Anesthesiology

## 2019-12-24 ENCOUNTER — Other Ambulatory Visit: Payer: Self-pay

## 2019-12-24 ENCOUNTER — Encounter (HOSPITAL_COMMUNITY): Payer: Self-pay | Admitting: Cardiology

## 2019-12-24 DIAGNOSIS — I4892 Unspecified atrial flutter: Secondary | ICD-10-CM | POA: Insufficient documentation

## 2019-12-24 DIAGNOSIS — Z885 Allergy status to narcotic agent status: Secondary | ICD-10-CM | POA: Diagnosis not present

## 2019-12-24 DIAGNOSIS — F17200 Nicotine dependence, unspecified, uncomplicated: Secondary | ICD-10-CM | POA: Insufficient documentation

## 2019-12-24 DIAGNOSIS — Z6833 Body mass index (BMI) 33.0-33.9, adult: Secondary | ICD-10-CM | POA: Insufficient documentation

## 2019-12-24 DIAGNOSIS — Z8249 Family history of ischemic heart disease and other diseases of the circulatory system: Secondary | ICD-10-CM | POA: Insufficient documentation

## 2019-12-24 DIAGNOSIS — Z79899 Other long term (current) drug therapy: Secondary | ICD-10-CM | POA: Insufficient documentation

## 2019-12-24 DIAGNOSIS — Z7901 Long term (current) use of anticoagulants: Secondary | ICD-10-CM | POA: Insufficient documentation

## 2019-12-24 DIAGNOSIS — I483 Typical atrial flutter: Secondary | ICD-10-CM | POA: Diagnosis not present

## 2019-12-24 DIAGNOSIS — Z903 Acquired absence of stomach [part of]: Secondary | ICD-10-CM | POA: Insufficient documentation

## 2019-12-24 DIAGNOSIS — I422 Other hypertrophic cardiomyopathy: Secondary | ICD-10-CM | POA: Diagnosis not present

## 2019-12-24 DIAGNOSIS — I5030 Unspecified diastolic (congestive) heart failure: Secondary | ICD-10-CM | POA: Insufficient documentation

## 2019-12-24 DIAGNOSIS — I4819 Other persistent atrial fibrillation: Secondary | ICD-10-CM | POA: Insufficient documentation

## 2019-12-24 HISTORY — PX: ATRIAL FIBRILLATION ABLATION: EP1191

## 2019-12-24 LAB — POCT ACTIVATED CLOTTING TIME
Activated Clotting Time: 274 seconds
Activated Clotting Time: 307 seconds
Activated Clotting Time: 307 seconds
Activated Clotting Time: 334 seconds

## 2019-12-24 SURGERY — ATRIAL FIBRILLATION ABLATION
Anesthesia: General

## 2019-12-24 MED ORDER — PROTAMINE SULFATE 10 MG/ML IV SOLN
INTRAVENOUS | Status: DC | PRN
  Administered 2019-12-24: 50 mg via INTRAVENOUS

## 2019-12-24 MED ORDER — OXYCODONE HCL 5 MG/5ML PO SOLN: 5.0000 mg | Freq: Once | ORAL | Status: DC | PRN

## 2019-12-24 MED ORDER — ONDANSETRON HCL 4 MG/2ML IJ SOLN
INTRAMUSCULAR | Status: DC | PRN
  Administered 2019-12-24: 4 mg via INTRAVENOUS

## 2019-12-24 MED ORDER — HEPARIN (PORCINE) IN NACL 1000-0.9 UT/500ML-% IV SOLN
INTRAVENOUS | Status: DC | PRN
  Administered 2019-12-24: 500 mL

## 2019-12-24 MED ORDER — FENTANYL CITRATE (PF) 100 MCG/2ML IJ SOLN: 25.0000 ug | INTRAMUSCULAR | Status: DC | PRN

## 2019-12-24 MED ORDER — PHENYLEPHRINE HCL-NACL 10-0.9 MG/250ML-% IV SOLN
INTRAVENOUS | Status: DC | PRN
  Administered 2019-12-24: 20 ug/min via INTRAVENOUS

## 2019-12-24 MED ORDER — SUGAMMADEX SODIUM 200 MG/2ML IV SOLN
INTRAVENOUS | Status: DC | PRN
  Administered 2019-12-24: 190 mg via INTRAVENOUS

## 2019-12-24 MED ORDER — ONDANSETRON HCL 4 MG/2ML IJ SOLN: 4.0000 mg | Freq: Four times a day (QID) | INTRAMUSCULAR | Status: DC | PRN

## 2019-12-24 MED ORDER — HEPARIN SODIUM (PORCINE) 1000 UNIT/ML IJ SOLN
INTRAMUSCULAR | Status: AC
  Filled 2019-12-24: qty 1

## 2019-12-24 MED ORDER — SODIUM CHLORIDE 0.9 % IV SOLN
INTRAVENOUS | Status: DC
  Administered 2019-12-24: 50 mL/h via INTRAVENOUS

## 2019-12-24 MED ORDER — MIDAZOLAM HCL 2 MG/2ML IJ SOLN
INTRAMUSCULAR | Status: DC | PRN
  Administered 2019-12-24: 2 mg via INTRAVENOUS

## 2019-12-24 MED ORDER — ROCURONIUM BROMIDE 10 MG/ML (PF) SYRINGE
PREFILLED_SYRINGE | INTRAVENOUS | Status: DC | PRN
  Administered 2019-12-24: 50 mg via INTRAVENOUS

## 2019-12-24 MED ORDER — ALBUMIN HUMAN 5 % IV SOLN: INTRAVENOUS | Status: DC | PRN

## 2019-12-24 MED ORDER — OXYCODONE HCL 5 MG PO TABS: 5.0000 mg | ORAL_TABLET | Freq: Once | ORAL | Status: DC | PRN

## 2019-12-24 MED ORDER — DOBUTAMINE IN D5W 4-5 MG/ML-% IV SOLN
INTRAVENOUS | Status: DC | PRN
  Administered 2019-12-24: 4 ug/kg/min via INTRAVENOUS

## 2019-12-24 MED ORDER — DEXAMETHASONE SODIUM PHOSPHATE 10 MG/ML IJ SOLN
INTRAMUSCULAR | Status: DC | PRN
  Administered 2019-12-24: 10 mg via INTRAVENOUS

## 2019-12-24 MED ORDER — PHENYLEPHRINE 40 MCG/ML (10ML) SYRINGE FOR IV PUSH (FOR BLOOD PRESSURE SUPPORT)
PREFILLED_SYRINGE | INTRAVENOUS | Status: DC | PRN
  Administered 2019-12-24 (×2): 80 ug via INTRAVENOUS
  Administered 2019-12-24: 40 ug via INTRAVENOUS
  Administered 2019-12-24: 80 ug via INTRAVENOUS

## 2019-12-24 MED ORDER — SODIUM CHLORIDE 0.9% FLUSH: 3.0000 mL | INTRAVENOUS | Status: DC | PRN

## 2019-12-24 MED ORDER — DOBUTAMINE IN D5W 4-5 MG/ML-% IV SOLN
INTRAVENOUS | Status: AC
  Filled 2019-12-24: qty 250

## 2019-12-24 MED ORDER — HEPARIN (PORCINE) IN NACL 1000-0.9 UT/500ML-% IV SOLN
INTRAVENOUS | Status: AC
  Filled 2019-12-24: qty 2500

## 2019-12-24 MED ORDER — FENTANYL CITRATE (PF) 250 MCG/5ML IJ SOLN
INTRAMUSCULAR | Status: DC | PRN
  Administered 2019-12-24 (×2): 50 ug via INTRAVENOUS

## 2019-12-24 MED ORDER — SODIUM CHLORIDE 0.9% FLUSH: 3.0000 mL | Freq: Two times a day (BID) | INTRAVENOUS | Status: DC

## 2019-12-24 MED ORDER — SODIUM CHLORIDE 0.9 % IV SOLN: 250.0000 mL | INTRAVENOUS | Status: DC | PRN

## 2019-12-24 MED ORDER — LIDOCAINE 2% (20 MG/ML) 5 ML SYRINGE
INTRAMUSCULAR | Status: DC | PRN
  Administered 2019-12-24: 40 mg via INTRAVENOUS

## 2019-12-24 MED ORDER — HEPARIN SODIUM (PORCINE) 1000 UNIT/ML IJ SOLN
INTRAMUSCULAR | Status: DC | PRN
  Administered 2019-12-24: 14000 [IU] via INTRAVENOUS
  Administered 2019-12-24: 5000 [IU] via INTRAVENOUS
  Administered 2019-12-24: 3000 [IU] via INTRAVENOUS
  Administered 2019-12-24 (×2): 2000 [IU] via INTRAVENOUS

## 2019-12-24 MED ORDER — PROPOFOL 10 MG/ML IV BOLUS
INTRAVENOUS | Status: DC | PRN
  Administered 2019-12-24: 100 mg via INTRAVENOUS
  Administered 2019-12-24 (×2): 10 mg via INTRAVENOUS

## 2019-12-24 MED ORDER — ACETAMINOPHEN 325 MG PO TABS: 650.0000 mg | ORAL_TABLET | ORAL | Status: DC | PRN

## 2019-12-24 MED ORDER — DEXAMETHASONE SODIUM PHOSPHATE 10 MG/ML IJ SOLN: INTRAMUSCULAR | Status: DC | PRN

## 2019-12-24 SURGICAL SUPPLY — 23 items
BAG SNAP BAND KOVER 36X36 (MISCELLANEOUS) ×1 IMPLANT
BLANKET WARM UNDERBOD FULL ACC (MISCELLANEOUS) ×2 IMPLANT
CATH 8FR REPROCESSED SOUNDSTAR (CATHETERS) ×2 IMPLANT
CATH 8FR SOUNDSTAR REPROCESSED (CATHETERS) IMPLANT
CATH MAPPNG PENTARAY F 2-6-2MM (CATHETERS) IMPLANT
CATH SMTCH THERMOCOOL SF DF (CATHETERS) ×1 IMPLANT
CATH WEBSTER BI DIR CS D-F CRV (CATHETERS) ×1 IMPLANT
COVER SWIFTLINK CONNECTOR (BAG) ×2 IMPLANT
DEVICE CLOSURE PERCLS PRGLD 6F (VASCULAR PRODUCTS) IMPLANT
PACK EP LATEX FREE (CUSTOM PROCEDURE TRAY) ×1
PACK EP LF (CUSTOM PROCEDURE TRAY) ×1 IMPLANT
PAD PRO RADIOLUCENT 2001M-C (PAD) ×2 IMPLANT
PATCH CARTO3 (PAD) ×1 IMPLANT
PENTARAY F 2-6-2MM (CATHETERS) ×2
PERCLOSE PROGLIDE 6F (VASCULAR PRODUCTS) ×10
SHEATH BAYLIS SUREFLEX  M 8.5 (SHEATH) ×1
SHEATH BAYLIS SUREFLEX M 8.5 (SHEATH) IMPLANT
SHEATH BAYLIS TRANSSEPTAL 98CM (NEEDLE) ×1 IMPLANT
SHEATH CARTO VIZIGO SM CVD (SHEATH) ×1 IMPLANT
SHEATH PINNACLE 7F 10CM (SHEATH) ×1 IMPLANT
SHEATH PINNACLE 8F 10CM (SHEATH) ×2 IMPLANT
SHEATH PINNACLE 9F 10CM (SHEATH) ×1 IMPLANT
TUBING SMART ABLATE COOLFLOW (TUBING) ×1 IMPLANT

## 2019-12-24 NOTE — Anesthesia Preprocedure Evaluation (Addendum)
Anesthesia Evaluation  Patient identified by MRN, date of birth, ID band Patient awake    Reviewed: Allergy & Precautions, H&P , NPO status , Patient's Chart, lab work & pertinent test results  Airway Mallampati: II  TM Distance: >3 FB Neck ROM: full    Dental  (+) Poor Dentition, Dental Advisory Given   Pulmonary Current Smoker and Patient abstained from smoking.,    breath sounds clear to auscultation       Cardiovascular +CHF  + dysrhythmias Atrial Fibrillation  Rhythm:regular Rate:Normal  LVH   Neuro/Psych    GI/Hepatic   Endo/Other  obese  Renal/GU      Musculoskeletal   Abdominal   Peds  Hematology   Anesthesia Other Findings   Reproductive/Obstetrics                            Anesthesia Physical Anesthesia Plan  ASA: III  Anesthesia Plan: General   Post-op Pain Management:    Induction: Intravenous  PONV Risk Score and Plan: 2 and Ondansetron, Dexamethasone, Midazolam and Treatment may vary due to age or medical condition  Airway Management Planned: Oral ETT  Additional Equipment:   Intra-op Plan:   Post-operative Plan: Extubation in OR  Informed Consent: I have reviewed the patients History and Physical, chart, labs and discussed the procedure including the risks, benefits and alternatives for the proposed anesthesia with the patient or authorized representative who has indicated his/her understanding and acceptance.       Plan Discussed with: CRNA, Anesthesiologist and Surgeon  Anesthesia Plan Comments:         Anesthesia Quick Evaluation

## 2019-12-24 NOTE — Transfer of Care (Signed)
Immediate Anesthesia Transfer of Care Note  Patient: Hannah Zimmerman  Procedure(s) Performed: ATRIAL FIBRILLATION ABLATION (N/A )  Patient Location: PACU  Anesthesia Type:General  Level of Consciousness: awake and patient cooperative  Airway & Oxygen Therapy: Patient Spontanous Breathing  Post-op Assessment: Report given to RN and Post -op Vital signs reviewed and stable  Post vital signs: Reviewed and stable  Last Vitals:  Vitals Value Taken Time  BP    Temp    Pulse    Resp    SpO2      Last Pain:  Vitals:   12/24/19 0623  PainSc: 0-No pain      Patients Stated Pain Goal: 3 ([id number])  Complications: No apparent anesthesia complications

## 2019-12-24 NOTE — H&P (Signed)
Hannah Zimmerman has presented today for surgery, with the diagnosis of atrial fibrillation.  The various methods of treatment have been discussed with the patient and family. After consideration of risks, benefits and other options for treatment, the patient has consented to  Procedure(s): Catheter ablation as a surgical intervention .  Risks include but not limited to bleeding, tamponade, heart block, stroke, damage to surrounding organs, among others. The patient's history has been reviewed, patient examined, no change in status, stable for surgery.  I have reviewed the patient's chart and labs.  Questions were answered to the patient's satisfaction.    Alekhya Gravlin Curt Bears, MD 12/24/2019 7:59 AM

## 2019-12-24 NOTE — Discharge Instructions (Signed)
Cardiac Ablation, Care After This sheet gives you information about how to care for yourself after your procedure. Your health care provider may also give you more specific instructions. If you have problems or questions, contact your health care provider. What can I expect after the procedure? After the procedure, it is common to have:  Bruising around your puncture site.  Tenderness around your puncture site.  Skipped heartbeats.  Tiredness (fatigue). Follow these instructions at home: Puncture site care   Follow instructions from your health care provider about how to take care of your puncture site. Make sure you: ? Wash your hands with soap and water before you change your bandage (dressing). If soap and water are not available, use hand sanitizer. ? Change your dressing as told by your health care provider. ? Leave stitches (sutures), skin glue, or adhesive strips in place. These skin closures may need to stay in place for up to 2 weeks. If adhesive strip edges start to loosen and curl up, you may trim the loose edges. Do not remove adhesive strips completely unless your health care provider tells you to do that.  Check your puncture site every day for signs of infection. Check for: ? Redness, swelling, or pain. ? Fluid or blood. If your puncture site starts to bleed, lie down on your back, apply firm pressure to the area, and contact your health care provider. ? Warmth. ? Pus or a bad smell. Driving  Ask your health care provider when it is safe for you to drive again after the procedure.  Do not drive or use heavy machinery while taking prescription pain medicine.  Do not drive for 24 hours if you were given a medicine to help you relax (sedative) during your procedure. Activity  Avoid activities that take a lot of effort for at least 3 days after your procedure.  Do not lift anything that is heavier than 10 lb (4.5 kg), or the limit that you are told, until your health  care provider says that it is safe.  Return to your normal activities as told by your health care provider. Ask your health care provider what activities are safe for you. General instructions  Take over-the-counter and prescription medicines only as told by your health care provider.  Do not use any products that contain nicotine or tobacco, such as cigarettes and e-cigarettes. If you need help quitting, ask your health care provider.  Do not take baths, swim, or use a hot tub until your health care provider approves.  Do not drink alcohol for 24 hours after your procedure.  Keep all follow-up visits as told by your health care provider. This is important. Contact a health care provider if:  You have redness, mild swelling, or pain around your puncture site.  You have fluid or blood coming from your puncture site that stops after applying firm pressure to the area.  Your puncture site feels warm to the touch.  You have pus or a bad smell coming from your puncture site.  You have a fever.  You have chest pain or discomfort that spreads to your neck, jaw, or arm.  You are sweating a lot.  You feel nauseous.  You have a fast or irregular heartbeat.  You have shortness of breath.  You are dizzy or light-headed and feel the need to lie down.  You have pain or numbness in the arm or leg closest to your puncture site. Get help right away if:  Your puncture   site suddenly swells.  Your puncture site is bleeding and the bleeding does not stop after applying firm pressure to the area. These symptoms may represent a serious problem that is an emergency. Do not wait to see if the symptoms will go away. Get medical help right away. Call your local emergency services (911 in the [place] Do not drive yourself to the hospital. Summary  After the procedure, it is normal to have bruising and tenderness at the puncture site in your groin, neck, or forearm.  Check your puncture site every  day for signs of infection.  Get help right away if your puncture site is bleeding and the bleeding does not stop after applying firm pressure to the area. This is a medical emergency. This information is not intended to replace advice given to you by your health care provider. Make sure you discuss any questions you have with your health care provider. Document Revised: 08/17/2017 Document Reviewed: 12/14/2016 Elsevier Patient Education  2020 [employer]  

## 2019-12-24 NOTE — Progress Notes (Signed)
Discharge instructions reviewed with pt and her sister (via telephone) both voice understanding. 

## 2019-12-24 NOTE — Progress Notes (Signed)
Ambulated in hallway tol well  

## 2019-12-25 MED ORDER — COLCHICINE 0.6 MG PO TABS: 0.6000 mg | ORAL_TABLET | Freq: Every day | ORAL | 0 refills | Status: DC

## 2019-12-25 NOTE — Telephone Encounter (Signed)
Pt reports chest discomfort.  She can't take ibuprofen d/t stomach issues when taking after her gastric bypass. Pt advised, per Dr. Curt Bears, to take Colchicine 0.6 mg once daily for once week.  She will take it for 3 days and stop if pain improves after the 3 days. She also reports 12 lb weight gain post procedure yesterday.  Advised, per Camnitz, to increase Lasix to 80 mg BID & K+ 40 mEq daily for next 3 days. Patient verbalized understanding and agreeable to plan.

## 2019-12-26 ENCOUNTER — Ambulatory Visit (HOSPITAL_COMMUNITY): Payer: Managed Care, Other (non HMO)

## 2019-12-26 NOTE — Anesthesia Postprocedure Evaluation (Signed)
Anesthesia Post Note  Patient: Hannah Zimmerman  Procedure(s) Performed: ATRIAL FIBRILLATION ABLATION (N/A )     Patient location during evaluation: PACU Anesthesia Type: General Level of consciousness: awake and alert Pain management: pain level controlled Vital Signs Assessment: post-procedure vital signs reviewed and stable Respiratory status: spontaneous breathing, nonlabored ventilation, respiratory function stable and patient connected to nasal cannula oxygen Cardiovascular status: blood pressure returned to baseline and stable Postop Assessment: no apparent nausea or vomiting Anesthetic complications: no    Last Vitals:  Vitals:   12/24/19 1715 12/24/19 1730  BP: (!) 87/57 100/70  Pulse: (!) 58 63  Resp: 17 16  Temp:    SpO2: 98% 95%    Last Pain:  Vitals:   12/25/19 1325  TempSrc:   PainSc: 0-No pain                 Cohan Stipes S

## 2019-12-31 ENCOUNTER — Encounter: Payer: Self-pay | Admitting: *Deleted

## 2019-12-31 ENCOUNTER — Telehealth: Payer: Self-pay | Admitting: Cardiovascular Disease

## 2019-12-31 NOTE — Telephone Encounter (Signed)
Spoke with patient regarding appointment for Cardiac MRI scheduled 01/28/20 at 8:00 am at [facility]---arrival time is 7:15 am 1st floor radiology.  Will mail information to patient and it is available in My Chart

## 2020-01-21 ENCOUNTER — Encounter (HOSPITAL_COMMUNITY): Payer: Self-pay | Admitting: Physician Assistant

## 2020-01-21 ENCOUNTER — Ambulatory Visit (HOSPITAL_COMMUNITY)
Admission: RE | Admit: 2020-01-21 | Discharge: 2020-01-21 | Disposition: A | Discharge status: Home / Self Care | Payer: Managed Care, Other (non HMO) | Source: Ambulatory Visit | Attending: Physician Assistant | Admitting: Physician Assistant

## 2020-01-21 ENCOUNTER — Other Ambulatory Visit: Payer: Self-pay

## 2020-01-21 VITALS — BP 118/78 | HR 53 | Ht 64.0 in | Wt 197.2 lb

## 2020-01-21 DIAGNOSIS — Z79899 Other long term (current) drug therapy: Secondary | ICD-10-CM | POA: Diagnosis not present

## 2020-01-21 DIAGNOSIS — F17210 Nicotine dependence, cigarettes, uncomplicated: Secondary | ICD-10-CM | POA: Insufficient documentation

## 2020-01-21 DIAGNOSIS — Z6833 Body mass index (BMI) 33.0-33.9, adult: Secondary | ICD-10-CM | POA: Insufficient documentation

## 2020-01-21 DIAGNOSIS — I4819 Other persistent atrial fibrillation: Secondary | ICD-10-CM | POA: Diagnosis present

## 2020-01-21 DIAGNOSIS — Z8249 Family history of ischemic heart disease and other diseases of the circulatory system: Secondary | ICD-10-CM | POA: Insufficient documentation

## 2020-01-21 DIAGNOSIS — Z7901 Long term (current) use of anticoagulants: Secondary | ICD-10-CM | POA: Insufficient documentation

## 2020-01-21 DIAGNOSIS — I5030 Unspecified diastolic (congestive) heart failure: Secondary | ICD-10-CM | POA: Insufficient documentation

## 2020-01-21 DIAGNOSIS — Z9884 Bariatric surgery status: Secondary | ICD-10-CM | POA: Diagnosis not present

## 2020-01-21 DIAGNOSIS — I440 Atrioventricular block, first degree: Secondary | ICD-10-CM | POA: Diagnosis not present

## 2020-01-21 DIAGNOSIS — I422 Other hypertrophic cardiomyopathy: Secondary | ICD-10-CM | POA: Insufficient documentation

## 2020-01-21 NOTE — Progress Notes (Signed)
Primary Care Physician: Patient, No Pcp Per Primary Cardiologist: Dr Rockey Situ Primary Electrophysiologist: Dr Curt Bears Referring Physician: Dr Pat Patrick is a 49 y.o. female with a history of diastolic CHF, morbid obesity s/p gastric bypass, hypertrophic CM, and persistent atrial fibrillation who presents for follow up in the [facility]. Patient is on Eliquis for a CHADS2VASC score of 1. Patient continued to have episodes of afib with symptoms of generalized fatigue and dyspnea with exertion despite flecainide. She underwent afib ablation with Dr Curt Bears on 12/24/19. She had two very brief episodes of heart racing lasting only a few seconds since the procedure. She denies CP, swallowing, or groin issues. She has a cardiac MRI scheduled for next week.   Today, she denies symptoms of chest pain, shortness of breath, orthopnea, PND, lower extremity edema, dizziness, presyncope, syncope, snoring, daytime somnolence, bleeding, or neurologic sequela. The patient is tolerating medications without difficulties and is otherwise without complaint today.    Atrial Fibrillation Risk Factors:  she does not have symptoms or diagnosis of sleep apnea. she does not have a history of rheumatic fever. The patient does have a history of early familial atrial fibrillation or other arrhythmias.  she has a BMI of Body mass index is 33.85 kg/m.Marland Kitchen Filed Weights   01/21/20 1539  Weight: 89.4 kg    Family History  Problem Relation Age of Onset  . Hypertension Mother   . Diabetes Mother   . Heart failure Mother      Atrial Fibrillation Management history:  Previous antiarrhythmic drugs: flecainide Previous cardioversions: 09/24/19, 10/17/19 Previous ablations: 12/24/19 CHADS2VASC score: 1 Anticoagulation history: Eliquis   Past Medical History:  Diagnosis Date  . (HFpEF) heart failure with preserved ejection fraction ([place])    a. 07/2019 Echo: EF 60-65%, mod LVH.  Sev dil LA, mildly dil RA. Mod elev PASP.  Marland Kitchen Complication of anesthesia 20 years ago   a. spinal with first c section went too high stopped breathing, low bp with 2nd c section, no further issues with anesthesia  . Heart murmur    a. 07/2019 Echo: no significant valvular dzs.  Marland Kitchen LVH (left ventricular hypertrophy)    a. 07/2019 Echo: Mod LVH.  . Morbid obesity ([place])    a. 09/2014 s/p gastric bypass.  . Persistent atrial fibrillation ([place])    a. Dx 07/2019-->CHA2DS2VASc = 2 (diast CHF/Fem)-->Eliquis; b. 09/2018 s/p DCCV (150J (biphasic) x 1).  . Rash    both arms from old bed bug bites, healing  . Tobacco abuse    Past Surgical History:  Procedure Laterality Date  . ABDOMINAL HYSTERECTOMY    . ANKLE SURGERY     left x 2  . ATRIAL FIBRILLATION ABLATION N/A 12/24/2019   Procedure: ATRIAL FIBRILLATION ABLATION;  Surgeon: Constance Haw, MD;  Location: [facility];  Service: Cardiovascular;  Laterality: N/A;  . CARDIOVERSION N/A 09/24/2019   Procedure: CARDIOVERSION;  Surgeon: Minna Merritts, MD;  Location: [facility];  Service: Cardiovascular;  Laterality: N/A;  . CARDIOVERSION N/A 10/17/2019   Procedure: CARDIOVERSION;  Surgeon: Minna Merritts, MD;  Location: [facility];  Service: Cardiovascular;  Laterality: N/A;  . CESAREAN SECTION     x 3  . LAPAROSCOPIC GASTRIC SLEEVE RESECTION WITH HIATAL HERNIA REPAIR  08/17/2015   Procedure: LAPAROSCOPIC GASTRIC SLEEVE RESECTION WITH HIATAL HERNIA REPAIR;  Surgeon: Johnathan Hausen, MD;  Location: [facility];  Service: General;;  . TUBAL LIGATION  x 2  . UPPER GI ENDOSCOPY  08/17/2015   Procedure: UPPER GI ENDOSCOPY;  Surgeon: Johnathan Hausen, MD;  Location: [facility];  Service: General;;    Current Outpatient Medications  Medication Sig Dispense Refill  . acetaminophen (TYLENOL) 500 MG tablet Take 1,000 mg by mouth every 6 (six) hours as needed (for headaches.).    Marland Kitchen apixaban (ELIQUIS) 5 MG TABS tablet Take 1 tablet (5 mg total) by mouth 2  (two) times daily. 180 tablet 1  . flecainide (TAMBOCOR) 100 MG tablet Take 1 tablet (100 mg total) by mouth 2 (two) times daily. 180 tablet 3  . furosemide (LASIX) 40 MG tablet Take 1 tablet (40 mg total) by mouth 2 (two) times daily. 180 tablet 3  . metoprolol tartrate (LOPRESSOR) 25 MG tablet Take 1 tablet (25 mg total) by mouth 2 (two) times daily. 180 tablet 2  . potassium chloride SA (KLOR-CON) 20 MEQ tablet Take 1 tablet (20 mEq total) by mouth daily. 90 tablet 2   No current facility-administered medications for this encounter.    Allergies  Allergen Reactions  . Codeine Hives       . Hydrocodone Hives    Social History   Socioeconomic History  . Marital status: Legally Separated    Spouse name: Not on file  . Number of children: Not on file  . Years of education: Not on file  . Highest education level: Not on file  Occupational History  . Not on file  Tobacco Use  . Smoking status: Current Every Day Smoker    Packs/day: 0.50    Years: 31.00    Pack years: 15.50    Types: Cigarettes    Last attempt to quit: 05/03/2015    Years since quitting: 4.7  . Smokeless tobacco: Never Used  . Tobacco comment: half pack daily  Substance and Sexual Activity  . Alcohol use: Yes    Alcohol/week: 1.0 standard drinks    Types: 1 Standard drinks or equivalent per week  . Drug use: No  . Sexual activity: Yes  Other Topics Concern  . Not on file  Social History Narrative  . Not on file   Social Determinants of Health   Financial Resource Strain: Low Risk   . Difficulty of Paying Living Expenses: Not hard at all  Food Insecurity: No Food Insecurity  . Worried About Charity fundraiser in the Last Year: Never true  . Ran Out of Food in the Last Year: Never true  Transportation Needs: No Transportation Needs  . Lack of Transportation (Medical): No  . Lack of Transportation (Non-Medical): No  Physical Activity: Sufficiently Active  . Days of Exercise per Week: 7 days  .  Minutes of Exercise per Session: 150+ min  Stress: No Stress Concern Present  . Feeling of Stress : Only a little  Social Connections: Somewhat Isolated  . Frequency of Communication with Friends and Family: More than three times a week  . Frequency of Social Gatherings with Friends and Family: More than three times a week  . Attends Religious Services: 1 to 4 times per year  . Active Member of Clubs or Organizations: No  . Attends Archivist Meetings: Never  . Marital Status: Separated  Intimate Partner Violence: Not At Risk  . Fear of Current or Ex-Partner: No  . Emotionally Abused: No  . Physically Abused: No  . Sexually Abused: No     ROS- All systems are reviewed and negative except as  per the HPI above.  Physical Exam: Vitals:   01/21/20 1539  BP: 118/78  Pulse: (!) 53  Weight: 89.4 kg  Height: 5\' 4"  (1.626 m)    GEN- The patient is well appearing obese female, alert and oriented x 3 today.   Head- normocephalic, atraumatic Eyes-  Sclera clear, conjunctiva pink Ears- hearing intact Oropharynx- clear Neck- supple  Lungs- Clear to ausculation bilaterally, normal work of breathing Heart- Regular rate and rhythm, no murmurs, rubs or gallops  GI- soft, NT, ND, + BS Extremities- no clubbing, cyanosis, or edema MS- no significant deformity or atrophy Skin- no rash or lesion Psych- euthymic mood, full affect Neuro- strength and sensation are intact  Wt Readings from Last 3 Encounters:  01/21/20 89.4 kg  12/24/19 88.5 kg  11/04/19 91.6 kg    EKG today demonstrates SB HR 53, NST, 1st degree AV block, PR 206, QRS 82, QTc 422  Echo 07/31/19 demonstrated  1. Left ventricular ejection fraction, by visual estimation, is 60 to  65%. The left ventricle has normal function. There is moderately increased  left ventricular hypertrophy.  2. Left ventricular diastolic parameters are indeterminate.  3. Global right ventricle has normal systolic function.The right   ventricular size is normal. No increase in right ventricular wall  thickness.  4. Left atrial size was severely dilated.  5. Right atrial size was mildly dilated  6. Moderately elevated pulmonary artery systolic pressure.  7. Rhythm is atrial fibrillation.   Epic records are reviewed at length today  CHA2DS2-VASc Score = 1  The patient's score is based upon: CHF History: 0 HTN History: 0 Age : 0 Diabetes History: 0 Stroke History: 0 Vascular Disease History: 0 Gender: 1      ASSESSMENT AND PLAN: 1. Persistent Atrial Fibrillation (ICD10:  I48.19) The patient's CHA2DS2-VASc score is 1, indicating a 0.6% annual risk of stroke.   S/p afib ablation on 12/24/19. Patient appears to be maintaining SR. Continue Eliquis 5 mg BID with no missed doses for at least 3 months post ablation. Continue flecainide 100 mg BID.  If her MRI shows HCM, may need to consider alternate AAD or d/c flecainide. Long term anticoagulation would also be indicated despite low CHADS2VASC score.  Continue Lopressor 25 mg BID  2. Obesity Body mass index is 33.85 kg/m. S/p gastric bypass. Lifestyle modification was discussed at length including regular exercise and weight reduction.   3. Hypertrophic CM Noted on cardiac CT. Cardiac MRI pending.   Follow up with Dr Rockey Situ as scheduled and Dr Curt Bears per recall.    [facility] [address] [place], [place] [address] [phone] 01/21/2020 4:02 PM

## 2020-01-26 ENCOUNTER — Ambulatory Visit: Payer: Managed Care, Other (non HMO) | Admitting: Cardiovascular Disease

## 2020-01-27 ENCOUNTER — Telehealth (HOSPITAL_COMMUNITY): Payer: Self-pay | Admitting: Emergency Medicine

## 2020-01-27 NOTE — Telephone Encounter (Signed)
Reaching out to patient to offer assistance regarding upcoming cardiac imaging study; pt verbalizes understanding of appt date/time, parking situation and where to check in, and verified current allergies; name and call back number provided for further questions should they arise Hannah Mountz RN Navigator Cardiac Imaging [facility] [phone] office [phone] cell   Denies implants, denies claustro  

## 2020-01-28 ENCOUNTER — Other Ambulatory Visit: Payer: Self-pay

## 2020-01-28 ENCOUNTER — Ambulatory Visit (HOSPITAL_COMMUNITY)
Admission: RE | Admit: 2020-01-28 | Discharge: 2020-01-28 | Disposition: A | Discharge status: Home / Self Care | Payer: Managed Care, Other (non HMO) | Source: Ambulatory Visit | Attending: Family | Admitting: Family

## 2020-01-28 DIAGNOSIS — I517 Cardiomegaly: Secondary | ICD-10-CM

## 2020-01-28 DIAGNOSIS — I5032 Chronic diastolic (congestive) heart failure: Secondary | ICD-10-CM

## 2020-01-28 DIAGNOSIS — I4819 Other persistent atrial fibrillation: Secondary | ICD-10-CM | POA: Diagnosis present

## 2020-01-28 MED ORDER — GADOBUTROL 1 MMOL/ML IV SOLN
9.0000 mL | Freq: Once | INTRAVENOUS | Status: AC | PRN
  Administered 2020-01-28: 9 mL via INTRAVENOUS

## 2020-03-01 NOTE — Progress Notes (Signed)
Patient ID: MARGEAN KORELL, female   DOB: June 03, 1971, 49 y.o.   MRN: [id number] Cardiology Office Note  Date:  03/02/2020   ID:  LESA VANDALL, DOB [birth year], MRN [id number]  PCP:  Patient, No Pcp Per   Chief Complaint  Patient presents with  . Other    3 monthf ollow up. meds reviewed verbally with patient.     HPI:  [name] is a very pleasant 49 year old woman with history of  obesity,  Smoking,  previous history of tachycardia,  pulmonary hypertension  Gastric bypass surgery 07/2015, down 130 pounds, stable weight who presents for routine follow-up of her tachycardia and pulmonary hypertension, atrial fibrillation  Recent events discussed in detail S/p EP study 12/24/2019 1. Persistent atrial fibrillation. 2. Typical appearing atrial flutter  CONCLUSIONS: 1. Sinus rhythm upon presentation.   2. Successful electrical isolation and anatomical encircling of all four pulmonary veins with radiofrequency current.   Further ablation across the roof of the left atrium. 3. Cavo-tricuspid isthmus ablation was performed with complete bidirectional isthmus block achieved.  4. No inducible arrhythmias following ablation both on and off of dobutamine 5.  Cardioversion  Reports that she has felt well, denies any significant tachyarrhythmias Sometimes very short event that is self-limiting Has been more active, denies significant shortness of breath on exertion  Separated from her husband, Reports it was a good move, less stress  Has had orthostasis on metoprolol 25 twice daily, she decrease the dose down to 12.5 twice daily with flecainide 100 twice daily Remains on anticoagulation  She remains on Lasix 40 twice daily Denies leg swelling  EKG personally reviewed by myself on todays visit Shows normal sinus rhythm with rate 59 bpm nonspecific T wave abnormality anterolateral leads inferior leads   PMH:   has a past medical history of (HFpEF) heart failure with preserved  ejection fraction ([place]), Complication of anesthesia (20 years ago), Heart murmur, LVH (left ventricular hypertrophy), Morbid obesity ([place]), Persistent atrial fibrillation ([place]), Rash, and Tobacco abuse.  PSH:    Past Surgical History:  Procedure Laterality Date  . ABDOMINAL HYSTERECTOMY    . ANKLE SURGERY     left x 2  . ATRIAL FIBRILLATION ABLATION N/A 12/24/2019   Procedure: ATRIAL FIBRILLATION ABLATION;  Surgeon: Constance Haw, MD;  Location: [facility];  Service: Cardiovascular;  Laterality: N/A;  . CARDIOVERSION N/A 09/24/2019   Procedure: CARDIOVERSION;  Surgeon: Minna Merritts, MD;  Location: [facility];  Service: Cardiovascular;  Laterality: N/A;  . CARDIOVERSION N/A 10/17/2019   Procedure: CARDIOVERSION;  Surgeon: Minna Merritts, MD;  Location: [facility];  Service: Cardiovascular;  Laterality: N/A;  . CESAREAN SECTION     x 3  . LAPAROSCOPIC GASTRIC SLEEVE RESECTION WITH HIATAL HERNIA REPAIR  08/17/2015   Procedure: LAPAROSCOPIC GASTRIC SLEEVE RESECTION WITH HIATAL HERNIA REPAIR;  Surgeon: Johnathan Hausen, MD;  Location: [facility];  Service: General;;  . TUBAL LIGATION     x 2  . UPPER GI ENDOSCOPY  08/17/2015   Procedure: UPPER GI ENDOSCOPY;  Surgeon: Johnathan Hausen, MD;  Location: [facility];  Service: General;;    Current Outpatient Medications  Medication Sig Dispense Refill  . acetaminophen (TYLENOL) 500 MG tablet Take 1,000 mg by mouth every 6 (six) hours as needed (for headaches.).    Marland Kitchen apixaban (ELIQUIS) 5 MG TABS tablet Take 1 tablet (5 mg total) by mouth 2 (two) times daily. 180 tablet 1  . flecainide (TAMBOCOR) 100 MG tablet Take 1  tablet (100 mg total) by mouth 2 (two) times daily. 180 tablet 3  . furosemide (LASIX) 40 MG tablet Take 1 tablet (40 mg total) by mouth 2 (two) times daily. 180 tablet 3  . metoprolol tartrate (LOPRESSOR) 25 MG tablet Take 12.5 mg by mouth 2 (two) times daily.    . potassium chloride SA (KLOR-CON) 20 MEQ tablet Take 1 tablet (20 mEq  total) by mouth daily. 90 tablet 2   No current facility-administered medications for this visit.    Allergies:   Codeine and Hydrocodone   Social History:  The patient  reports that she has been smoking cigarettes. She has a 15.50 pack-year smoking history. She has never used smokeless tobacco. She reports current alcohol use of about 1.0 standard drink of alcohol per week. She reports that she does not use drugs.   Family History:   family history includes Diabetes in her mother; Heart failure in her mother; Hypertension in her mother.    Review of Systems: Review of Systems  HENT: Negative.   Respiratory: Negative.   Cardiovascular: Negative.   Gastrointestinal: Negative.   Musculoskeletal: Negative.   Neurological: Negative.   Psychiatric/Behavioral: Negative.   All other systems reviewed and are negative.  PHYSICAL EXAM: VS:  BP 100/66 (BP Location: Left Arm, Patient Position: Sitting, Cuff Size: Normal)   Pulse (!) 59   Ht 5\' 4"  (1.626 m)   Wt 197 lb (89.4 kg)   SpO2 93%   BMI 33.81 kg/m  , BMI Body mass index is 33.81 kg/m.  Constitutional:  oriented to person, place, and time. No distress.  HENT:  Head: Normocephalic and atraumatic.  Eyes:  no discharge. No scleral icterus.  Neck: Normal range of motion. Neck supple. No JVD present.  Cardiovascular: Irregularly irregular normal heart sounds and intact distal pulses. Exam reveals no gallop and no friction rub. No edema No murmur heard. Pulmonary/Chest: Effort normal and breath sounds normal. No stridor. No respiratory distress.  no wheezes.  no rales.  no tenderness.  Abdominal: Soft.  no distension.  no tenderness.  Musculoskeletal: Normal range of motion.  no  tenderness or deformity.  Neurological:  normal muscle tone. Coordination normal. No atrophy Skin: Skin is warm and dry. No rash noted. not diaphoretic.  Psychiatric:  normal mood and affect. behavior is normal. Thought content normal.   Recent  Labs: 07/30/2019: B Natriuretic Peptide 918.0; TSH 2.226 08/01/2019: ALT 86; Magnesium 2.0 12/22/2019: BUN 14; Creatinine, Ser 0.97; Hemoglobin 17.5; Platelets 274; Potassium 3.8; Sodium 142    Lipid Panel Lab Results  Component Value Date   CHOL 121 07/31/2019   HDL 35 (L) 07/31/2019   LDLCALC 72 07/31/2019   TRIG 68 07/31/2019      Wt Readings from Last 3 Encounters:  03/02/20 197 lb (89.4 kg)  01/21/20 197 lb 3.2 oz (89.4 kg)  12/24/19 195 lb (88.5 kg)     ASSESSMENT AND PLAN:  Atrial fibrillation, persistent Recent ablation for flutter, fib Reports it was very successful has had minimal arrhythmia since procedure April 2021 Will stay on low-dose metoprolol and flecainide twice daily, On anticoagulation, has follow-up with Dr. Curt Bears  Smoking Smoking cessation recommended Very strategies discussed  Morbid obesity History of gastric bypass surgery, over 100 pound weight loss, Weight stable,  Active now that arrhythmia issues are well controlled  Pulmonary hypertension/diastolic CHF Continue Lasix 40 twice daily, hold dose for orthostasis She does have high fluid intake  Disposition:   F/U 12 month  Total encounter time more than 25 minutes  Greater than 50% was spent in counseling and coordination of care with the patient   No orders of the defined types were placed in this encounter.    Signed, Esmond Plants, M.D., Ph.D. 03/02/2020  [place], [place]

## 2020-03-02 ENCOUNTER — Encounter: Payer: Self-pay | Admitting: Cardiovascular Disease

## 2020-03-02 ENCOUNTER — Other Ambulatory Visit: Payer: Self-pay

## 2020-03-02 ENCOUNTER — Ambulatory Visit: Payer: Managed Care, Other (non HMO) | Admitting: Cardiovascular Disease

## 2020-03-02 VITALS — BP 100/66 | HR 59 | Ht 64.0 in | Wt 197.0 lb

## 2020-03-02 DIAGNOSIS — I4819 Other persistent atrial fibrillation: Secondary | ICD-10-CM

## 2020-03-02 DIAGNOSIS — I5032 Chronic diastolic (congestive) heart failure: Secondary | ICD-10-CM | POA: Diagnosis not present

## 2020-03-02 DIAGNOSIS — I272 Pulmonary hypertension, unspecified: Secondary | ICD-10-CM | POA: Diagnosis not present

## 2020-03-02 DIAGNOSIS — Z720 Tobacco use: Secondary | ICD-10-CM | POA: Diagnosis not present

## 2020-03-02 DIAGNOSIS — I517 Cardiomegaly: Secondary | ICD-10-CM

## 2020-03-02 NOTE — Patient Instructions (Signed)

## 2020-03-31 ENCOUNTER — Telehealth: Payer: Self-pay | Admitting: Cardiovascular Disease

## 2020-03-31 NOTE — Telephone Encounter (Signed)
Left voicemail message to call back  

## 2020-03-31 NOTE — Telephone Encounter (Signed)
Spoke to pt. Scheduled to see Dr. Curt Bears on Monday, 7/19, for post ablation follow up. She reports that she feels "off" and BP have been low, she has reduced her Metoprolol to 12.5 mg BID. Aware we will further address when we see her Monday. Advised to call the office if worsening issues prior to Monday. Patient verbalized understanding and agreeable to plan.

## 2020-03-31 NOTE — Telephone Encounter (Signed)
S/p ablation 3 months feels "off" fatigue  wants ekg    Please call

## 2020-04-05 ENCOUNTER — Encounter: Payer: Self-pay | Admitting: *Deleted

## 2020-04-05 ENCOUNTER — Encounter: Payer: Self-pay | Admitting: Cardiology

## 2020-04-05 ENCOUNTER — Other Ambulatory Visit: Payer: Self-pay

## 2020-04-05 ENCOUNTER — Ambulatory Visit (INDEPENDENT_AMBULATORY_CARE_PROVIDER_SITE_OTHER): Payer: Managed Care, Other (non HMO) | Admitting: Cardiology

## 2020-04-05 VITALS — BP 102/60 | HR 63 | Ht 64.0 in | Wt 200.0 lb

## 2020-04-05 DIAGNOSIS — I4819 Other persistent atrial fibrillation: Secondary | ICD-10-CM

## 2020-04-05 DIAGNOSIS — R002 Palpitations: Secondary | ICD-10-CM | POA: Diagnosis not present

## 2020-04-05 NOTE — Progress Notes (Signed)
Patient ID: Hannah Zimmerman, female   DOB: [birth year], 49 y.o.   MRN: [id number] Patient enrolled for [employer] to ship a14 day ZIO XT long term holter monitor to her home.

## 2020-04-05 NOTE — Progress Notes (Signed)
Electrophysiology Office Note   Date:  04/05/2020   ID:  Hannah Zimmerman, DOB August 31, 1971, MRN [id number]  PCP:  Patient, No Pcp Per  Cardiologist:  Hannah Zimmerman Primary Electrophysiologist:  Hannah Zimmerman Hannah Leeds, MD    Chief Complaint: AF   History of Present Illness: Hannah Zimmerman is a 49 y.o. female who is being seen today for the evaluation of AF at the request of No ref. provider found. Presenting today for electrophysiology evaluation.  She has a history significant for heart failure with preserved ejection fraction, morbid obesity status post gastric bypass, persistent atrial fibrillation, and tobacco abuse.  Her symptoms from atrial fibrillation include generalized fatigue, leg weakness, and dyspnea on exertion.  He has had 2 cardioversions in the past, but neither of them lasted long.  Her left atrium is severely dilated and she has a cardiac MRI to further evaluate this.  She states that she is gotten quite frustrated with her atrial fibrillation.  She is fatigued and tired all the time which is gotten in the way of her daily activities.  Today, denies symptoms of palpitations, chest pain, shortness of breath, orthopnea, PND, lower extremity edema, claudication, dizziness, presyncope, syncope, bleeding, or neurologic sequela. The patient is tolerating medications without difficulties.  She has been doing well.  She has no chest pain or shortness of breath.  She does note intermittent palpitations that feel different from her atrial fibrillation.  She also has felt somewhat fatigued over the last few weeks.  She does not feel like she has had any further episodes of AF.   Past Medical History:  Diagnosis Date  . (HFpEF) heart failure with preserved ejection fraction ([place])    a. 07/2019 Echo: EF 60-65%, mod LVH. Sev dil LA, mildly dil RA. Mod elev PASP.  Hannah Zimmerman Complication of anesthesia 20 years ago   a. spinal with first c section went too high stopped breathing, low bp with 2nd c  section, no further issues with anesthesia  . Heart murmur    a. 07/2019 Echo: no significant valvular dzs.  Hannah Zimmerman LVH (left ventricular hypertrophy)    a. 07/2019 Echo: Mod LVH.  . Morbid obesity ([place])    a. 09/2014 s/p gastric bypass.  . Persistent atrial fibrillation ([place])    a. Dx 07/2019-->CHA2DS2VASc = 2 (diast CHF/Fem)-->Eliquis; b. 09/2018 s/p DCCV (150J (biphasic) x 1).  . Rash    both arms from old bed bug bites, healing  . Tobacco abuse    Past Surgical History:  Procedure Laterality Date  . ABDOMINAL HYSTERECTOMY    . ANKLE SURGERY     left x 2  . ATRIAL FIBRILLATION ABLATION N/A 12/24/2019   Procedure: ATRIAL FIBRILLATION ABLATION;  Surgeon: Constance Haw, MD;  Location: [facility];  Service: Cardiovascular;  Laterality: N/A;  . CARDIOVERSION N/A 09/24/2019   Procedure: CARDIOVERSION;  Surgeon: Minna Merritts, MD;  Location: [facility];  Service: Cardiovascular;  Laterality: N/A;  . CARDIOVERSION N/A 10/17/2019   Procedure: CARDIOVERSION;  Surgeon: Minna Merritts, MD;  Location: [facility];  Service: Cardiovascular;  Laterality: N/A;  . CESAREAN SECTION     x 3  . LAPAROSCOPIC GASTRIC SLEEVE RESECTION WITH HIATAL HERNIA REPAIR  08/17/2015   Procedure: LAPAROSCOPIC GASTRIC SLEEVE RESECTION WITH HIATAL HERNIA REPAIR;  Surgeon: Hannah Hausen, MD;  Location: [facility];  Service: General;;  . TUBAL LIGATION     x 2  . UPPER GI ENDOSCOPY  08/17/2015   Procedure: UPPER GI ENDOSCOPY;  Surgeon: Hannah Hausen, MD;  Location: [facility];  Service: General;;     Current Outpatient Medications  Medication Sig Dispense Refill  . acetaminophen (TYLENOL) 500 MG tablet Take 1,000 mg by mouth every 6 (six) hours as needed (for headaches.).    Hannah Zimmerman apixaban (ELIQUIS) 5 MG TABS tablet Take 1 tablet (5 mg total) by mouth 2 (two) times daily. 180 tablet 1  . furosemide (LASIX) 40 MG tablet Take 1 tablet (40 mg total) by mouth 2 (two) times daily. 180 tablet 3  . potassium chloride SA  (KLOR-CON) 20 MEQ tablet Take 1 tablet (20 mEq total) by mouth daily. 90 tablet 2   No current facility-administered medications for this visit.    Allergies:   Codeine and Hydrocodone   Social History:  The patient  reports that she has been smoking cigarettes. She has a 15.50 pack-year smoking history. She has never used smokeless tobacco. She reports current alcohol use of about 1.0 standard drink of alcohol per week. She reports that she does not use drugs.   Family History:  The patient's family history includes Diabetes in her mother; Heart failure in her mother; Hypertension in her mother.   ROS:  Please see the history of present illness.   Otherwise, review of systems is positive for none.   All other systems are reviewed and negative.   PHYSICAL EXAM: VS:  BP 102/60   Pulse 63   Ht 5\' 4"  (1.626 m)   Wt 200 lb (90.7 kg)   SpO2 96%   BMI 34.33 kg/m  , BMI Body mass index is 34.33 kg/m. GEN: Well nourished, well developed, in no acute distress  HEENT: normal  Neck: no JVD, carotid bruits, or masses Cardiac: RRR; no murmurs, rubs, or gallops,no edema  Respiratory:  clear to auscultation bilaterally, normal work of breathing GI: soft, nontender, nondistended, + BS MS: no deformity or atrophy  Skin: warm and dry Neuro:  Strength and sensation are intact Psych: euthymic mood, full affect  EKG:  EKG is ordered today. Personal review of the ekg ordered shows sinus rhythm, rate 63  Recent Labs: 07/30/2019: B Natriuretic Peptide 918.0; TSH 2.226 08/01/2019: ALT 86; Magnesium 2.0 12/22/2019: BUN 14; Creatinine, Ser 0.97; Hemoglobin 17.5; Platelets 274; Potassium 3.8; Sodium 142    Lipid Panel     Component Value Date/Time   CHOL 121 07/31/2019 0559   TRIG 68 07/31/2019 0559   HDL 35 (L) 07/31/2019 0559   CHOLHDL 3.5 07/31/2019 0559   VLDL 14 07/31/2019 0559   LDLCALC 72 07/31/2019 0559     Wt Readings from Last 3 Encounters:  04/05/20 200 lb (90.7 kg)  03/02/20 197  lb (89.4 kg)  01/21/20 197 lb 3.2 oz (89.4 kg)      Other studies Reviewed: Additional studies/ records that were reviewed today include: Cardiac MRI 01/28/2020 Review of the above records today demonstrates:  1.  Normal LV chamber size, with hyperdynamic function, LVEF 67%.  2.  Normal RV chamber size, RVEF 70%.  3. 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 Maximal wall thickness at the mid ventricular level is 19 mm.  4. Large area of dense delayed myocardial enhancement at the mid ventricular level in the area of maximal wall thickness. Visually appears > 15% of the myocardial mass.  5. Severe left atrial enlargement. Iatrogenic left to right atrial level shunt.  6.  No findings to suggests cardiac amyloidosis.  ECV 22%.   ASSESSMENT AND PLAN:  1.  Persistent atrial fibrillation: Currently on Eliquis, flecainide, metoprolol.  Status post AF ablation 12/24/2019.  She does not feel like she has had any further episodes of atrial fibrillation.  Due to that, we Vishruth Seoane stop flecainide and metoprolol.  She is having some mild palpitations.  We Amparo Donalson have her wear a 2-week monitor.  2.  Tobacco abuse: Smoking cessation encouraged  3.  Morbid obesity: Goal weight of 200 pounds.  Diet and exercise encouraged  4.  Hypertrophic cardiomyopathy: Found on CT scan.  Cardiac MRI confirmed, though not massive LVH.  She does have some LGE noted.  We Arali Somera plan for cardiac monitoring.  Current medicines are reviewed at length with the patient today.   The patient does not have concerns regarding her medicines.  The following changes were made today: Stop flecainide, metoprolol  Labs/ tests ordered today include:  Orders Placed This Encounter  Procedures  . LONG TERM MONITOR (3-14 DAYS)  . EKG 12-Lead     Disposition:   FU with Vi Biddinger 3 months  Signed, Torrey Horseman Hannah Leeds, MD  04/05/2020 1:48 PM     [place] [address] [phone] (office) [phone] (fax)

## 2020-04-05 NOTE — Patient Instructions (Signed)
Medication Instructions:  Your physician has recommended you make the following change in your medication: 1. STOP Flecainide 2. STOP Metoprolol (Lopressor  *If you need a refill on your cardiac medications before your next appointment, please call your pharmacy*   Lab Work: None ordered   Testing/Procedures: Your physician has recommended that you wear a 2 week holter monitor. Holter monitors are medical devices that record the heart's electrical activity. Doctors most often use these monitors to diagnose arrhythmias. Arrhythmias are problems with the speed or rhythm of the heartbeat. The monitor is a small, portable device. You can wear one while you do your normal daily activities. This is usually used to diagnose what is causing palpitations/syncope (passing out).   Follow-Up: At [facility], you and your health needs are our priority.  As part of our continuing mission to provide you with exceptional heart care, we have created designated Provider Care Teams.  These Care Teams include your primary Cardiologist (physician) and Advanced Practice Providers (APPs -  Physician Assistants and Nurse Practitioners) who all work together to provide you with the care you need, when you need it.  We recommend signing up for the patient portal called "MyChart".  Sign up information is provided on this After Visit Summary.  MyChart is used to connect with patients for Virtual Visits (Telemedicine).  Patients are able to view lab/test results, encounter notes, upcoming appointments, etc.  Non-urgent messages can be sent to your provider as well.   To learn more about what you can do with MyChart, go to [email or web address].    Your next appointment:   3 month(s)  The format for your next appointment:   In Person  Provider:   Allegra Lai, MD   Thank you for choosing [facility]!!   Trinidad Curet, RN [phone]    Other Instructions  ZIO XT- Long Term Monitor Instructions     Your physician has requested you wear your ZIO patch monitor_______days.   This is a single patch monitor.  [employer] supplies one patch monitor per enrollment.  Additional stickers are not available.   Please do not apply patch if you will be having a Nuclear Stress Test, Echocardiogram, Cardiac CT, MRI, or Chest Xray during the time frame you would be wearing the monitor. The patch cannot be worn during these tests.  You cannot remove and re-apply the ZIO XT patch monitor.   Your ZIO patch monitor will be sent USPS Priority mail from [facility] directly to your home address. The monitor may also be mailed to a PO BOX if home delivery is not available.   It may take 3-5 days to receive your monitor after you have been enrolled.   Once you have received you monitor, please review enclosed instructions.  Your monitor has already been registered assigning a specific monitor serial # to you.   Applying the monitor   Shave hair from upper left chest.   Hold abrader disc by orange tab.  Rub abrader in 40 strokes over left upper chest as indicated in your monitor instructions.   Clean area with 4 enclosed alcohol pads .  Use all pads to assure are is cleaned thoroughly.  Let dry.   Apply patch as indicated in monitor instructions.  Patch will be place under collarbone on left side of chest with arrow pointing upward.   Rub patch adhesive wings for 2 minutes.Remove white label marked "1".  Remove white label marked "2".  Rub patch adhesive wings for 2  additional minutes.   While looking in a mirror, press and release button in center of patch.  A small green light will flash 3-4 times .  This will be your only indicator the monitor has been turned on.     Do not shower for the first 24 hours.  You may shower after the first 24 hours.   Press button if you feel a symptom. You will hear a small click.  Record Date, Time and Symptom in the Patient Log Book.   When you are ready to  remove patch, follow instructions on last 2 pages of Patient Log Book.  Stick patch monitor onto last page of Patient Log Book.   Place Patient Log Book in [place] box.  Use locking tab on box and tape box closed securely.  The [employer] and [employer] has [employer] on it.  Please place in mailbox as soon as possible.  Your physician should have your test results approximately 7 days after the monitor has been mailed back to [facility].   Call [place] at [phone] if you have questions regarding your ZIO XT patch monitor.  Call them immediately if you see an orange light blinking on your monitor.   If your monitor falls off in less than 4 days contact our Monitor department at [phone].  If your monitor becomes loose or falls off after 4 days call [employer] at [phone] for suggestions on securing your monitor.

## 2020-05-13 ENCOUNTER — Other Ambulatory Visit: Payer: Self-pay | Admitting: Cardiovascular Disease

## 2020-05-13 NOTE — Telephone Encounter (Signed)
Please review for refill. Thanks!  

## 2020-07-05 ENCOUNTER — Other Ambulatory Visit: Payer: Self-pay

## 2020-07-05 ENCOUNTER — Ambulatory Visit (INDEPENDENT_AMBULATORY_CARE_PROVIDER_SITE_OTHER): Payer: Managed Care, Other (non HMO) | Admitting: Cardiology

## 2020-07-05 ENCOUNTER — Encounter: Payer: Self-pay | Admitting: Cardiology

## 2020-07-05 VITALS — BP 114/68 | HR 94 | Ht 64.0 in | Wt 196.8 lb

## 2020-07-05 DIAGNOSIS — I4819 Other persistent atrial fibrillation: Secondary | ICD-10-CM | POA: Diagnosis not present

## 2020-07-05 DIAGNOSIS — Z01812 Encounter for preprocedural laboratory examination: Secondary | ICD-10-CM | POA: Diagnosis not present

## 2020-07-05 NOTE — Patient Instructions (Addendum)
Medication Instructions:  Your physician recommends that you continue on your current medications as directed. Please refer to the Current Medication list given to you today.  *If you need a refill on your cardiac medications before your next appointment, please call your pharmacy*   Lab Work: Pre procedure labs between 10/25 - 11/12:  BMP & CBC You can go to the [facility] for this blood work, you do NOT need to be fasting. If you have labs (blood work) drawn today and your tests are completely normal, you will receive your results only by: Marland Kitchen MyChart Message (if you have MyChart) OR . A paper copy in the mail If you have any lab test that is abnormal or we need to change your treatment, we will call you to review the results.   Testing/Procedures: Your physician has requested that you have cardiac CT within 7 days PRIOR to your ablation. Cardiac computed tomography (CT) is a painless test that uses an x-ray machine to take clear, detailed pictures of your heart.  Please follow instruction below located under "other instructions". You will get a call from our office to schedule the date for this test.  Your physician has recommended that you have an ablation. Catheter ablation is a medical procedure used to treat some cardiac arrhythmias (irregular heartbeats). During catheter ablation, a long, thin, flexible tube is put into a blood vessel in your groin (upper thigh), or neck. This tube is called an ablation catheter. It is then guided to your heart through the blood vessel. Radio frequency waves destroy small areas of heart tissue where abnormal heartbeats may cause an arrhythmia to start. Please follow instruction below located under "other instructions".   Follow-Up: At [facility], you and your health needs are our priority.  As part of our continuing mission to provide you with exceptional heart care, we have created designated Provider Care Teams.  These Care Teams include  your primary Cardiologist (physician) and Advanced Practice Providers (APPs -  Physician Assistants and Nurse Practitioners) who all work together to provide you with the care you need, when you need it.   You will need an H&P within 30 days prior to repeat ablation.  We can do this as a virtual visit (phone call only).  You are scheduled on _________________ @ ________________.   Your next appointment:   1 month(s) after your ablation on 08/11/20  The format for your next appointment:   In Person  Provider:   AFib clinic   Thank you for choosing [facility]!!   Trinidad Curet, RN [phone]    Other Instruction  CT INSTRUCTIONS Your cardiac CT will be scheduled at:  [facility] [address] [place], [place] [address] [phone]  If scheduled at [facility], please arrive 15 mins early for check-in and test prep.   Please follow these instructions carefully (unless otherwise directed):  On the Night Before the Test: . Be sure to Drink plenty of water. . Do not consume any caffeinated/decaffeinated beverages or chocolate 12 hours prior to your test. . Do not take any antihistamines 12 hours prior to your test.  On the Day of the Test: . Drink plenty of water. Do not drink any water within one hour of the test. . Do not eat any food 4 hours prior to the test. . You may take your regular medications prior to the test.  . Take metoprolol (Lopressor) 25 mg two hours  prior to test. . HOLD Furosemide/Hydrochlorothiazide morning of the test. . FEMALES- please wear underwire-free bra if available       After the Test: . Drink plenty of water. . After receiving IV contrast, you may experience a mild flushed feeling. This is normal. . On occasion, you may experience a mild rash up to 24 hours after the test. This is not dangerous. If this occurs, you can take Benadryl 25 mg and increase your  fluid intake. . If you experience trouble breathing, this can be serious. If it is severe call 911 IMMEDIATELY. If it is mild, please call our office. . If you take any of these medications: Glipizide/Metformin, Avandament, Glucavance, please do not take 48 hours after completing test unless otherwise instructed.   Once we have confirmed authorization from your insurance company, we will call you to set up a date and time for your test. Based on how quickly your insurance processes prior authorizations requests, please allow up to 4 weeks to be contacted for scheduling your Cardiac CT appointment. Be advised that routine Cardiac CT appointments could be scheduled as many as 8 weeks after your provider has ordered it.  For non-scheduling related questions, please contact the cardiac imaging nurse navigator should you have any questions/concerns: Marchia Bond, Cardiac Imaging Nurse Navigator Burley Saver, Interim Cardiac Imaging Nurse [place] [facility] Direct Office Dial: [phone]   For scheduling needs, including cancellations and rescheduling, please call Vivien Rota at [phone], option 3.      Electrophysiology/Ablation Procedure Instructions   You are scheduled for a(n) repeat ablation on 08/11/2020 with Dr. Allegra Lai.   1.   Pre procedure testing-             A.  LAB WORK --- Between  10/25 - 11/12 for your pre procedure blood work. You do NOT need to be fasting.  You can stop by [facility] for this bloodwork                B. COVID TEST-- On 08/09/2020 -  This is a Drive Up Visit at [address] [place], [place] [address].  Someone will direct you to the appropriate testing line. Stay in your car and someone will be with you shortly.   After you are tested please go home and self quarantine until the day of your procedure.     2. On the day of your procedure 08/11/20 you will go to [facility] [phone]) at 6:30 am.  Dennis Bast will  go to the main entrance [address] [employer]) and enter where the [employer] are.  Your driver will drop you off and you will head down the hallway to ADMITTING.  You may have one support person come in to the hospital with you.  They will be asked to wait in the waiting room.   3.   Do not eat or drink after midnight prior to your procedure.   4.   Do NOT take any medications the morning of your procedure.   5.  Plan for an overnight stay (if you go home same day, you will need someone to stay with you for 24 hours after the procedure).  If you use your phone frequently bring your phone charger.   6. You will follow up with the AFIB clinic 4 weeks after your procedure.  You will follow up with Dr. Curt Bears  3 months after your procedure.  These appointments  will be made for you.   * If you have ANY questions please call the office [phone] and ask for Shalana Jardin RN or send me a MyChart message   * Occasionally, EP Studies and ablations can become lengthy.  Please make your family aware of this before your procedure starts.  Average time ranges from 2-8 hours for EP studies/ablations.  Your physician will call your family after the procedure with the results.

## 2020-07-05 NOTE — Progress Notes (Signed)
Electrophysiology Office Note   Date:  07/06/2020   ID:  Hannah Zimmerman, DOB [birth year], MRN [id number]  PCP:  Patient, No Pcp Per  Cardiologist:  Rockey Situ Primary Electrophysiologist:  Marcanthony Sleight Meredith Leeds, MD    Chief Complaint: AF   History of Present Illness: Hannah Zimmerman is a 49 y.o. female who is being seen today for the evaluation of AF at the request of No ref. provider found. Presenting today for electrophysiology evaluation.  She has a history significant for heart failure with preserved ejection fraction, morbid obesity status post gastric bypass, persistent atrial fibrillation, and tobacco abuse.  She is now status post AF ablation on 12/24/2019.  Her CT prior to ablation showed hypertrophic cardiomyopathy which was confirmed by cardiac MRI with greater than 15% LGE.  Today, denies symptoms of palpitations, chest pain, shortness of breath, orthopnea, PND, lower extremity edema, claudication, dizziness, presyncope, syncope, bleeding, or neurologic sequela. The patient is tolerating medications without difficulties.  Unfortunately she has gone into atrial flutter.  Atrial flutter cycle length is different from her prior.  At this point, she would like to avoid cardioversion and plan for repeat ablation.  She does have some weakness and fatigue.  Otherwise she has been doing well.   Past Medical History:  Diagnosis Date  . (HFpEF) heart failure with preserved ejection fraction ([place])    a. 07/2019 Echo: EF 60-65%, mod LVH. Sev dil LA, mildly dil RA. Mod elev PASP.  Marland Kitchen Complication of anesthesia 20 years ago   a. spinal with first c section went too high stopped breathing, low bp with 2nd c section, no further issues with anesthesia  . Heart murmur    a. 07/2019 Echo: no significant valvular dzs.  Marland Kitchen LVH (left ventricular hypertrophy)    a. 07/2019 Echo: Mod LVH.  . Morbid obesity ([place])    a. 09/2014 s/p gastric bypass.  . Persistent atrial fibrillation ([place])    a. Dx  07/2019-->CHA2DS2VASc = 2 (diast CHF/Fem)-->Eliquis; b. 09/2018 s/p DCCV (150J (biphasic) x 1).  . Rash    both arms from old bed bug bites, healing  . Tobacco abuse    Past Surgical History:  Procedure Laterality Date  . ABDOMINAL HYSTERECTOMY    . ANKLE SURGERY     left x 2  . ATRIAL FIBRILLATION ABLATION N/A 12/24/2019   Procedure: ATRIAL FIBRILLATION ABLATION;  Surgeon: Constance Haw, MD;  Location: [place] [facility];  Service: Cardiovascular;  Laterality: N/A;  . CARDIOVERSION N/A 09/24/2019   Procedure: CARDIOVERSION;  Surgeon: Minna Merritts, MD;  Location: [facility];  Service: Cardiovascular;  Laterality: N/A;  . CARDIOVERSION N/A 10/17/2019   Procedure: CARDIOVERSION;  Surgeon: Minna Merritts, MD;  Location: [facility];  Service: Cardiovascular;  Laterality: N/A;  . CESAREAN SECTION     x 3  . LAPAROSCOPIC GASTRIC SLEEVE RESECTION WITH HIATAL HERNIA REPAIR  08/17/2015   Procedure: LAPAROSCOPIC GASTRIC SLEEVE RESECTION WITH HIATAL HERNIA REPAIR;  Surgeon: Johnathan Hausen, MD;  [facility];  Service: General;;  . TUBAL LIGATION     x 2  . UPPER GI ENDOSCOPY  08/17/2015   Procedure: UPPER GI ENDOSCOPY;  Surgeon: Johnathan Hausen, MD;  [facility];  Service: General;;     Current Outpatient Medications  Medication Sig Dispense Refill  . acetaminophen (TYLENOL) 500 MG tablet Take 1,000 mg by mouth every 6 (six) hours as needed (for headaches.).    Marland Kitchen ELIQUIS 5 MG TABS tablet TAKE 1 TABLET(5  MG) BY MOUTH TWICE DAILY 180 tablet 1  . flecainide (TAMBOCOR) 100 MG tablet Take 100 mg by mouth 2 (two) times daily.    . furosemide (LASIX) 40 MG tablet Take 40 mg by mouth daily as needed.    . metoprolol tartrate (LOPRESSOR) 25 MG tablet Take 12.5 mg by mouth daily.    . potassium chloride SA (KLOR-CON) 20 MEQ tablet Take 20 mEq by mouth daily as needed.     No current facility-administered medications for this visit.    Allergies:   Codeine and Hydrocodone   Social  History:  The patient  reports that she has been smoking cigarettes. She has a 15.50 pack-year smoking history. She has never used smokeless tobacco. She reports current alcohol use of about 1.0 standard drink of alcohol per week. She reports that she does not use drugs.   Family History:  The patient's family history includes Diabetes in her mother; Heart failure in her mother; Hypertension in her mother.   ROS:  Please see the history of present illness.   Otherwise, review of systems is positive for none.   All other systems are reviewed and negative.   PHYSICAL EXAM: VS:  BP 114/68   Pulse 94   Ht 5\' 4"  (1.626 m)   Wt 196 lb 12.8 oz (89.3 kg)   SpO2 98%   BMI 33.78 kg/m  , BMI Body mass index is 33.78 kg/m. GEN: Well nourished, well developed, in no acute distress  HEENT: normal  Neck: no JVD, carotid bruits, or masses Cardiac: RRR; no murmurs, rubs, or gallops,no edema  Respiratory:  clear to auscultation bilaterally, normal work of breathing GI: soft, nontender, nondistended, + BS MS: no deformity or atrophy  Skin: warm and dry Neuro:  Strength and sensation are intact Psych: euthymic mood, full affect  EKG:  EKG is ordered today. Personal review of the ekg ordered shows atrial flutter, rate 103  Recent Labs: 07/30/2019: B Natriuretic Peptide 918.0; TSH 2.226 08/01/2019: ALT 86; Magnesium 2.0 12/22/2019: BUN 14; Creatinine, Ser 0.97; Hemoglobin 17.5; Platelets 274; Potassium 3.8; Sodium 142    Lipid Panel     Component Value Date/Time   CHOL 121 07/31/2019 0559   TRIG 68 07/31/2019 0559   HDL 35 (L) 07/31/2019 0559   CHOLHDL 3.5 07/31/2019 0559   VLDL 14 07/31/2019 0559   LDLCALC 72 07/31/2019 0559     Wt Readings from Last 3 Encounters:  07/05/20 196 lb 12.8 oz (89.3 kg)  04/05/20 200 lb (90.7 kg)  03/02/20 197 lb (89.4 kg)      Other studies Reviewed: Additional studies/ records that were reviewed today include: Cardiac MRI 01/28/2020 Review of the above  records today demonstrates:  1.  Normal LV chamber size, with hyperdynamic function, LVEF 67%.  2.  Normal RV chamber size, RVEF 70%.  3. 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 Maximal wall thickness at the mid ventricular level is 19 mm.  4. Large area of dense delayed myocardial enhancement at the mid ventricular level in the area of maximal wall thickness. Visually appears > 15% of the myocardial mass.  5. Severe left atrial enlargement. Iatrogenic left to right atrial level shunt.  6.  No findings to suggests cardiac amyloidosis.  ECV 22%.   ASSESSMENT AND PLAN:  1.  Persistent atrial fibrillation/atrial flutter: Currently on Eliquis and flecainide (ECG monitoring for high risk medication).  CHA2DS2-VASc of 1.  Status post AF ablation 12/24/2019.  Unfortunately she returns  today in atrial flutter.  She would like to avoid further medications and cardioversion and would prefer ablation.  Risks and benefits were discussed which include bleeding, tamponade, heart block, stroke, damage to chest organs.  She understands these risks and has agreed to the procedure.  We Xaria Judon plan to stop her flecainide today.  2.  Tobacco abuse: Smoking cessation encouraged  3.  Morbid obesity: Goal weight of 200 pounds.  Diet and exercise encouraged  4.  Hypertrophic cardiomyopathy: On MRI.  She does have LGE.  Current medicines are reviewed at length with the patient today.   The patient does not have concerns regarding her medicines.  The following changes were made today: Stop flecainide  Labs/ tests ordered today include:  Orders Placed This Encounter  Procedures  . CT CARDIAC MORPH/PULM VEIN W/CM&W/O CA SCORE  . Basic metabolic panel  . CBC  . EKG 12-Lead     Disposition:   FU with Keerat [place] 3 months  Signed, Kemya Shed Meredith Leeds, MD  07/06/2020 7:42 AM     [facility] [address] [place]  [address] [phone] (office) [phone] (fax)

## 2020-07-16 NOTE — Addendum Note (Signed)
Addended by: Stanton Kidney on: 07/16/2020 05:57 PM   Modules accepted: Orders

## 2020-08-03 ENCOUNTER — Other Ambulatory Visit
Admission: RE | Admit: 2020-08-03 | Discharge: 2020-08-03 | Disposition: A | Discharge status: Home / Self Care | Payer: Managed Care, Other (non HMO) | Attending: Cardiology | Admitting: Cardiology

## 2020-08-03 ENCOUNTER — Telehealth (HOSPITAL_COMMUNITY): Payer: Self-pay | Admitting: Emergency Medicine

## 2020-08-03 ENCOUNTER — Telehealth (INDEPENDENT_AMBULATORY_CARE_PROVIDER_SITE_OTHER): Payer: Managed Care, Other (non HMO) | Admitting: Cardiology

## 2020-08-03 ENCOUNTER — Encounter: Payer: Self-pay | Admitting: Cardiology

## 2020-08-03 ENCOUNTER — Other Ambulatory Visit: Payer: Self-pay

## 2020-08-03 DIAGNOSIS — Z79899 Other long term (current) drug therapy: Secondary | ICD-10-CM | POA: Insufficient documentation

## 2020-08-03 DIAGNOSIS — Z720 Tobacco use: Secondary | ICD-10-CM | POA: Diagnosis not present

## 2020-08-03 DIAGNOSIS — Z7901 Long term (current) use of anticoagulants: Secondary | ICD-10-CM | POA: Insufficient documentation

## 2020-08-03 DIAGNOSIS — I484 Atypical atrial flutter: Secondary | ICD-10-CM

## 2020-08-03 DIAGNOSIS — Z01812 Encounter for preprocedural laboratory examination: Secondary | ICD-10-CM | POA: Diagnosis not present

## 2020-08-03 DIAGNOSIS — I4819 Other persistent atrial fibrillation: Secondary | ICD-10-CM | POA: Diagnosis not present

## 2020-08-03 LAB — CBC
HCT: 43.3 % (ref 36.0–46.0)
Hemoglobin: 15 g/dL (ref 12.0–15.0)
MCH: 36.3 pg — ABNORMAL HIGH (ref 26.0–34.0)
MCHC: 34.6 g/dL (ref 30.0–36.0)
MCV: 104.8 fL — ABNORMAL HIGH (ref 80.0–100.0)
Platelets: 247 10*3/uL (ref 150–400)
RBC: 4.13 MIL/uL (ref 3.87–5.11)
RDW: 14.4 % (ref 11.5–15.5)
WBC: 9.3 10*3/uL (ref 4.0–10.5)
nRBC: 0 % (ref 0.0–0.2)

## 2020-08-03 LAB — BASIC METABOLIC PANEL
Anion gap: 8 (ref 5–15)
BUN: 14 mg/dL (ref 6–20)
CO2: 27 mmol/L (ref 22–32)
Calcium: 8.7 mg/dL — ABNORMAL LOW (ref 8.9–10.3)
Chloride: 106 mmol/L (ref 98–111)
Creatinine, Ser: 0.76 mg/dL (ref 0.44–1.00)
GFR, Estimated: >60 mL/min (ref >60)
Glucose, Bld: 94 mg/dL (ref 70–99)
Potassium: 3.6 mmol/L (ref 3.5–5.1)
Sodium: 141 mmol/L (ref 135–145)

## 2020-08-03 NOTE — Progress Notes (Signed)
Electrophysiology TeleHealth Note   Due to national recommendations of social distancing due to COVID 19, an audio/video telehealth visit is felt to be most appropriate for this patient at this time.  See Epic message for the patient's consent to telehealth for [facility].   Date:  08/03/2020   ID:  Hannah Zimmerman, DOB [birth year], MRN [id number]  Location: patient's home  Provider location: [address] [place]  Evaluation Performed: Follow-up visit  PCP:  Patient, No Pcp Per  Cardiologist:  [name], MD  Electrophysiologist:  Dr Curt Bears  Chief Complaint:  AF  History of Present Illness:    Hannah Zimmerman is a 49 y.o. female who presents via audio/video conferencing for a telehealth visit today.  Since last being seen in our clinic, the patient reports doing very well.  Today, she denies symptoms of palpitations, chest pain, shortness of breath,  lower extremity edema, dizziness, presyncope, or syncope.  The patient is otherwise without complaint today.  The patient denies symptoms of fevers, chills, cough, or new SOB worrisome for COVID 19.  She has a history of heart failure with preserved ejection fraction, morbid obesity status post gastric bypass, persistent atrial fibrillation, and tobacco abuse.  She is status post AF ablation, but unfortunately has now had more frequent episodes of atrial flutter.  Plan for atrial flutter ablation 08/11/2020.  Today, denies symptoms of palpitations, chest pain, shortness of breath, orthopnea, PND, lower extremity edema, claudication, dizziness, presyncope, syncope, bleeding, or neurologic sequela. The patient is tolerating medications without difficulties.  She currently feels well.  She has no major complaints.  She states that she had her vitals taking approximately 1 week ago, and her heart rate and blood pressure are around where they usually are.  Her heart rate was in the 90s to 100.  Past Medical History:    Diagnosis Date  . (HFpEF) heart failure with preserved ejection fraction ([place])    a. 07/2019 Echo: EF 60-65%, mod LVH. Sev dil LA, mildly dil RA. Mod elev PASP.  Marland Kitchen Complication of anesthesia 20 years ago   a. spinal with first c section went too high stopped breathing, low bp with 2nd c section, no further issues with anesthesia  . Heart murmur    a. 07/2019 Echo: no significant valvular dzs.  Marland Kitchen LVH (left ventricular hypertrophy)    a. 07/2019 Echo: Mod LVH.  . Morbid obesity ([place])    a. 09/2014 s/p gastric bypass.  . Persistent atrial fibrillation ([place])    a. Dx 07/2019-->CHA2DS2VASc = 2 (diast CHF/Fem)-->Eliquis; b. 09/2018 s/p DCCV (150J (biphasic) x 1).  . Rash    both arms from old bed bug bites, healing  . Tobacco abuse     Past Surgical History:  Procedure Laterality Date  . ABDOMINAL HYSTERECTOMY    . ANKLE SURGERY     left x 2  . ATRIAL FIBRILLATION ABLATION N/A 12/24/2019   Procedure: ATRIAL FIBRILLATION ABLATION;  Surgeon: Constance Haw, MD;  Location: [place] [facility];  Service: Cardiovascular;  Laterality: N/A;  . CARDIOVERSION N/A 09/24/2019   Procedure: CARDIOVERSION;  Surgeon: Minna Merritts, MD;  Location: [facility];  Service: Cardiovascular;  Laterality: N/A;  . CARDIOVERSION N/A 10/17/2019   Procedure: CARDIOVERSION;  Surgeon: Minna Merritts, MD;  Location: [facility];  Service: Cardiovascular;  Laterality: N/A;  . CESAREAN SECTION     x 3  . LAPAROSCOPIC GASTRIC SLEEVE RESECTION WITH HIATAL HERNIA REPAIR  08/17/2015  Procedure: LAPAROSCOPIC GASTRIC SLEEVE RESECTION WITH HIATAL HERNIA REPAIR;  Surgeon: Johnathan Hausen, MD;  [facility];  Service: General;;  . TUBAL LIGATION     x 2  . UPPER GI ENDOSCOPY  08/17/2015   Procedure: UPPER GI ENDOSCOPY;  Surgeon: Johnathan Hausen, MD;  [facility];  Service: General;;    Current Outpatient Medications  Medication Sig Dispense Refill  . acetaminophen (TYLENOL) 500 MG tablet Take 1,000 mg by mouth  every 6 (six) hours as needed (for headaches.).    Marland Kitchen ELIQUIS 5 MG TABS tablet TAKE 1 TABLET(5 MG) BY MOUTH TWICE DAILY 180 tablet 1  . flecainide (TAMBOCOR) 100 MG tablet Take 100 mg by mouth 2 (two) times daily.    . furosemide (LASIX) 40 MG tablet Take 40 mg by mouth daily as needed.    . metoprolol tartrate (LOPRESSOR) 25 MG tablet Take 12.5 mg by mouth daily.    . potassium chloride SA (KLOR-CON) 20 MEQ tablet Take 20 mEq by mouth daily as needed.     No current facility-administered medications for this visit.    Allergies:   Codeine and Hydrocodone   Social History:  The patient  reports that she has been smoking cigarettes. She has a 15.50 pack-year smoking history. She has never used smokeless tobacco. She reports current alcohol use of about 1.0 standard drink of alcohol per week. She reports that she does not use drugs.   Family History:  The patient's  family history includes Diabetes in her mother; Heart failure in her mother; Hypertension in her mother.   ROS:  Please see the history of present illness.   All other systems are personally reviewed and negative.    Exam:    Vital Signs:  There were no vitals taken for this visit.  no acute distress, no shortness of breath.  Labs/Other Tests and Data Reviewed:    Recent Labs: 12/22/2019: BUN 14; Creatinine, Ser 0.97; Hemoglobin 17.5; Platelets 274; Potassium 3.8; Sodium 142   Wt Readings from Last 3 Encounters:  07/05/20 196 lb 12.8 oz (89.3 kg)  04/05/20 200 lb (90.7 kg)  03/02/20 197 lb (89.4 kg)     Other studies personally reviewed: Additional studies/ records that were reviewed today include: ECG 07/05/20  Review of the above records today demonstrates:  Atrial flutter  ASSESSMENT & PLAN:    1.  Persistent atrial fibrillation/atrial flutter: Currently on Eliquis and flecainide.  CHA2DS2-VASc of 1.  Status post AF ablation 12/24/2019.  Unfortunately she is now in atrial flutter.  Plan for ablation 08/11/2020.   Risks and benefits have been discussed.  Risk include bleeding, tamponade, heart block, stroke, damage to chest organs, among others.  She understands these risks and is agreed to the procedure.  2.  Tobacco abuse: Complete cessation encouraged  3.  Morbid obesity: Goal weight less than 200 pounds.  Diet and exercise encouraged.  COVID 19 screen The patient denies symptoms of COVID 19 at this time.  The importance of social distancing was discussed today.  Follow-up:  3 months  Current medicines are reviewed at length with the patient today.   The patient does not have concerns regarding her medicines.  The following changes were made today:  none  Labs/ tests ordered today include:  No orders of the defined types were placed in this encounter.    Patient Risk:  after full review of this patients clinical status, I feel that they are at moderate risk at this time.  Today, I have spent 10 minutes with the patient with telehealth technology discussing AF .    Signed, Lemont Sitzmann Meredith Leeds, MD  08/03/2020 8:32 AM     [facility] [address] [place] [address] [phone] (office) [phone] (fax)

## 2020-08-03 NOTE — Telephone Encounter (Signed)
Reaching out to patient to offer assistance regarding upcoming cardiac imaging study; pt verbalizes understanding of appt date/time, parking situation and where to check in, pre-test NPO status and medications ordered, and verified current allergies; name and call back number provided for further questions should they arise Marchia Bond RN [facility] [phone] office [phone] cell  Pt states office reviewed CT instructions few days ago, denies further questions. Call back number given. Phone call interrupted by call from [facility] office. Pt will call back if needed. Clarise Cruz

## 2020-08-03 NOTE — Addendum Note (Signed)
Addended by: Stanton Kidney on: 08/03/2020 08:34 AM   Modules accepted: Orders

## 2020-08-03 NOTE — H&P (View-Only) (Signed)
Electrophysiology TeleHealth Note   Due to national recommendations of social distancing due to COVID 19, an audio/video telehealth visit is felt to be most appropriate for this patient at this time.  See Epic message for the patient's consent to telehealth for [facility].   Date:  08/03/2020   ID:  Hannah Zimmerman, DOB [birth year], MRN [id number]  Location: patient's home  Provider location: [address] [place]  Evaluation Performed: Follow-up visit  PCP:  Patient, No Pcp Per  Cardiologist:  [name], MD  Electrophysiologist:  Dr Curt Bears  Chief Complaint:  AF  History of Present Illness:    Hannah Zimmerman is a 49 y.o. female who presents via audio/video conferencing for a telehealth visit today.  Since last being seen in our clinic, the patient reports doing very well.  Today, she denies symptoms of palpitations, chest pain, shortness of breath,  lower extremity edema, dizziness, presyncope, or syncope.  The patient is otherwise without complaint today.  The patient denies symptoms of fevers, chills, cough, or new SOB worrisome for COVID 19.  She has a history of heart failure with preserved ejection fraction, morbid obesity status post gastric bypass, persistent atrial fibrillation, and tobacco abuse.  She is status post AF ablation, but unfortunately has now had more frequent episodes of atrial flutter.  Plan for atrial flutter ablation 08/11/2020.  Today, denies symptoms of palpitations, chest pain, shortness of breath, orthopnea, PND, lower extremity edema, claudication, dizziness, presyncope, syncope, bleeding, or neurologic sequela. The patient is tolerating medications without difficulties.  She currently feels well.  She has no major complaints.  She states that she had her vitals taking approximately 1 week ago, and her heart rate and blood pressure are around where they usually are.  Her heart rate was in the 90s to 100.  Past Medical History:   Diagnosis Date  . (HFpEF) heart failure with preserved ejection fraction ([place])    a. 07/2019 Echo: EF 60-65%, mod LVH. Sev dil LA, mildly dil RA. Mod elev PASP.  Marland Kitchen Complication of anesthesia 20 years ago   a. spinal with first c section went too high stopped breathing, low bp with 2nd c section, no further issues with anesthesia  . Heart murmur    a. 07/2019 Echo: no significant valvular dzs.  Marland Kitchen LVH (left ventricular hypertrophy)    a. 07/2019 Echo: Mod LVH.  . Morbid obesity ([place])    a. 09/2014 s/p gastric bypass.  . Persistent atrial fibrillation ([place])    a. Dx 07/2019-->CHA2DS2VASc = 2 (diast CHF/Fem)-->Eliquis; b. 09/2018 s/p DCCV (150J (biphasic) x 1).  . Rash    both arms from old bed bug bites, healing  . Tobacco abuse     Past Surgical History:  Procedure Laterality Date  . ABDOMINAL HYSTERECTOMY    . ANKLE SURGERY     left x 2  . ATRIAL FIBRILLATION ABLATION N/A 12/24/2019   Procedure: ATRIAL FIBRILLATION ABLATION;  Surgeon: Constance Haw, MD;  Location: [place] [facility];  Service: Cardiovascular;  Laterality: N/A;  . CARDIOVERSION N/A 09/24/2019   Procedure: CARDIOVERSION;  Surgeon: Minna Merritts, MD;  Location: [facility];  Service: Cardiovascular;  Laterality: N/A;  . CARDIOVERSION N/A 10/17/2019   Procedure: CARDIOVERSION;  Surgeon: Minna Merritts, MD;  Location: [facility];  Service: Cardiovascular;  Laterality: N/A;  . CESAREAN SECTION     x 3  . LAPAROSCOPIC GASTRIC SLEEVE RESECTION WITH HIATAL HERNIA REPAIR  08/17/2015  Procedure: LAPAROSCOPIC GASTRIC SLEEVE RESECTION WITH HIATAL HERNIA REPAIR;  Surgeon: Johnathan Hausen, MD;  [facility];  Service: General;;  . TUBAL LIGATION     x 2  . UPPER GI ENDOSCOPY  08/17/2015   Procedure: UPPER GI ENDOSCOPY;  Surgeon: Johnathan Hausen, MD;  [facility];  Service: General;;    Current Outpatient Medications  Medication Sig Dispense Refill  . acetaminophen (TYLENOL) 500 MG tablet Take 1,000 mg by mouth  every 6 (six) hours as needed (for headaches.).    Marland Kitchen ELIQUIS 5 MG TABS tablet TAKE 1 TABLET(5 MG) BY MOUTH TWICE DAILY 180 tablet 1  . flecainide (TAMBOCOR) 100 MG tablet Take 100 mg by mouth 2 (two) times daily.    . furosemide (LASIX) 40 MG tablet Take 40 mg by mouth daily as needed.    . metoprolol tartrate (LOPRESSOR) 25 MG tablet Take 12.5 mg by mouth daily.    . potassium chloride SA (KLOR-CON) 20 MEQ tablet Take 20 mEq by mouth daily as needed.     No current facility-administered medications for this visit.    Allergies:   Codeine and Hydrocodone   Social History:  The patient  reports that she has been smoking cigarettes. She has a 15.50 pack-year smoking history. She has never used smokeless tobacco. She reports current alcohol use of about 1.0 standard drink of alcohol per week. She reports that she does not use drugs.   Family History:  The patient's  family history includes Diabetes in her mother; Heart failure in her mother; Hypertension in her mother.   ROS:  Please see the history of present illness.   All other systems are personally reviewed and negative.    Exam:    Vital Signs:  There were no vitals taken for this visit.  no acute distress, no shortness of breath.  Labs/Other Tests and Data Reviewed:    Recent Labs: 12/22/2019: BUN 14; Creatinine, Ser 0.97; Hemoglobin 17.5; Platelets 274; Potassium 3.8; Sodium 142   Wt Readings from Last 3 Encounters:  07/05/20 196 lb 12.8 oz (89.3 kg)  04/05/20 200 lb (90.7 kg)  03/02/20 197 lb (89.4 kg)     Other studies personally reviewed: Additional studies/ records that were reviewed today include: ECG 07/05/20  Review of the above records today demonstrates:  Atrial flutter  ASSESSMENT & PLAN:    1.  Persistent atrial fibrillation/atrial flutter: Currently on Eliquis and flecainide.  CHA2DS2-VASc of 1.  Status post AF ablation 12/24/2019.  Unfortunately she is now in atrial flutter.  Plan for ablation 08/11/2020.   Risks and benefits have been discussed.  Risk include bleeding, tamponade, heart block, stroke, damage to chest organs, among others.  She understands these risks and is agreed to the procedure.  2.  Tobacco abuse: Complete cessation encouraged  3.  Morbid obesity: Goal weight less than 200 pounds.  Diet and exercise encouraged.  COVID 19 screen The patient denies symptoms of COVID 19 at this time.  The importance of social distancing was discussed today.  Follow-up:  3 months  Current medicines are reviewed at length with the patient today.   The patient does not have concerns regarding her medicines.  The following changes were made today:  none  Labs/ tests ordered today include:  No orders of the defined types were placed in this encounter.    Patient Risk:  after full review of this patients clinical status, I feel that they are at moderate risk at this time.  Today, I have spent 10 minutes with the patient with telehealth technology discussing AF .    Signed, Olivya Sobol Meredith Leeds, MD  08/03/2020 8:32 AM     [facility] [address] [place] [address] [phone] (office) [phone] (fax)

## 2020-08-04 ENCOUNTER — Ambulatory Visit (HOSPITAL_COMMUNITY)
Admission: RE | Admit: 2020-08-04 | Discharge: 2020-08-04 | Disposition: A | Discharge status: Home / Self Care | Payer: Managed Care, Other (non HMO) | Source: Ambulatory Visit | Attending: Cardiology | Admitting: Cardiology

## 2020-08-04 ENCOUNTER — Other Ambulatory Visit: Payer: Self-pay

## 2020-08-04 DIAGNOSIS — I4819 Other persistent atrial fibrillation: Secondary | ICD-10-CM | POA: Diagnosis not present

## 2020-08-04 MED ORDER — IOHEXOL 350 MG/ML SOLN
80.0000 mL | Freq: Once | INTRAVENOUS | Status: AC | PRN
  Administered 2020-08-04: 80 mL via INTRAVENOUS

## 2020-08-09 ENCOUNTER — Other Ambulatory Visit: Payer: Self-pay

## 2020-08-09 ENCOUNTER — Other Ambulatory Visit
Admission: RE | Admit: 2020-08-09 | Discharge: 2020-08-09 | Disposition: A | Discharge status: Home / Self Care | Payer: Managed Care, Other (non HMO) | Source: Ambulatory Visit | Attending: Cardiology | Admitting: Cardiology

## 2020-08-09 DIAGNOSIS — Z20822 Contact with and (suspected) exposure to covid-19: Secondary | ICD-10-CM | POA: Insufficient documentation

## 2020-08-09 DIAGNOSIS — Z01812 Encounter for preprocedural laboratory examination: Secondary | ICD-10-CM | POA: Diagnosis not present

## 2020-08-09 LAB — SARS CORONAVIRUS 2 (TAT 6-24 HRS): SARS Coronavirus 2: NEGATIVE

## 2020-08-10 NOTE — Progress Notes (Signed)
Instructed patient on the following items: Arrival time 0630 Nothing to eat or drink after midnight No meds AM of procedure Responsible person to drive you home and stay with you for 24 hrs  Have you missed any doses of anti-coagulant Eliquis- hasn't missed any doses.   

## 2020-08-11 ENCOUNTER — Ambulatory Visit (HOSPITAL_COMMUNITY): Payer: Managed Care, Other (non HMO) | Admitting: Anesthesiology

## 2020-08-11 ENCOUNTER — Other Ambulatory Visit: Payer: Self-pay

## 2020-08-11 ENCOUNTER — Ambulatory Visit (HOSPITAL_COMMUNITY)
Admission: RE | Admit: 2020-08-11 | Discharge: 2020-08-11 | Disposition: A | Discharge status: Home / Self Care | Payer: Managed Care, Other (non HMO) | Attending: Cardiology | Admitting: Cardiology

## 2020-08-11 ENCOUNTER — Encounter (HOSPITAL_COMMUNITY): Payer: Self-pay | Admitting: Cardiology

## 2020-08-11 ENCOUNTER — Ambulatory Visit (HOSPITAL_BASED_OUTPATIENT_CLINIC_OR_DEPARTMENT_OTHER): Payer: Managed Care, Other (non HMO)

## 2020-08-11 ENCOUNTER — Encounter (HOSPITAL_COMMUNITY)
Admission: RE | Disposition: A | Discharge status: Home / Self Care | Payer: Self-pay | Source: Home / Self Care | Attending: Cardiology

## 2020-08-11 DIAGNOSIS — I361 Nonrheumatic tricuspid (valve) insufficiency: Secondary | ICD-10-CM

## 2020-08-11 DIAGNOSIS — I4819 Other persistent atrial fibrillation: Secondary | ICD-10-CM | POA: Diagnosis present

## 2020-08-11 DIAGNOSIS — I340 Nonrheumatic mitral (valve) insufficiency: Secondary | ICD-10-CM

## 2020-08-11 DIAGNOSIS — F17210 Nicotine dependence, cigarettes, uncomplicated: Secondary | ICD-10-CM | POA: Insufficient documentation

## 2020-08-11 DIAGNOSIS — I484 Atypical atrial flutter: Secondary | ICD-10-CM

## 2020-08-11 DIAGNOSIS — Z7901 Long term (current) use of anticoagulants: Secondary | ICD-10-CM | POA: Diagnosis not present

## 2020-08-11 DIAGNOSIS — Z885 Allergy status to narcotic agent status: Secondary | ICD-10-CM | POA: Diagnosis not present

## 2020-08-11 DIAGNOSIS — I4892 Unspecified atrial flutter: Secondary | ICD-10-CM | POA: Insufficient documentation

## 2020-08-11 DIAGNOSIS — I081 Rheumatic disorders of both mitral and tricuspid valves: Secondary | ICD-10-CM | POA: Insufficient documentation

## 2020-08-11 DIAGNOSIS — Z6833 Body mass index (BMI) 33.0-33.9, adult: Secondary | ICD-10-CM | POA: Insufficient documentation

## 2020-08-11 DIAGNOSIS — Z79899 Other long term (current) drug therapy: Secondary | ICD-10-CM | POA: Diagnosis not present

## 2020-08-11 HISTORY — PX: TEE WITHOUT CARDIOVERSION: SHX5443

## 2020-08-11 HISTORY — PX: ATRIAL FIBRILLATION ABLATION: EP1191

## 2020-08-11 LAB — POCT ACTIVATED CLOTTING TIME
Activated Clotting Time: 318 seconds
Activated Clotting Time: 362 seconds

## 2020-08-11 SURGERY — ATRIAL FIBRILLATION ABLATION
Anesthesia: General

## 2020-08-11 MED ORDER — ONDANSETRON HCL 4 MG/2ML IJ SOLN
INTRAMUSCULAR | Status: DC | PRN
  Administered 2020-08-11: 4 mg via INTRAVENOUS

## 2020-08-11 MED ORDER — DEXAMETHASONE SODIUM PHOSPHATE 10 MG/ML IJ SOLN
INTRAMUSCULAR | Status: DC | PRN
  Administered 2020-08-11: 4 mg via INTRAVENOUS

## 2020-08-11 MED ORDER — SUGAMMADEX SODIUM 200 MG/2ML IV SOLN
INTRAVENOUS | Status: DC | PRN
  Administered 2020-08-11: 200 mg via INTRAVENOUS

## 2020-08-11 MED ORDER — HEPARIN SODIUM (PORCINE) 1000 UNIT/ML IJ SOLN
INTRAMUSCULAR | Status: AC
  Filled 2020-08-11: qty 2

## 2020-08-11 MED ORDER — SODIUM CHLORIDE 0.9 % IV SOLN: INTRAVENOUS | Status: DC

## 2020-08-11 MED ORDER — FENTANYL CITRATE (PF) 100 MCG/2ML IJ SOLN
INTRAMUSCULAR | Status: AC
  Filled 2020-08-11: qty 2

## 2020-08-11 MED ORDER — ACETAMINOPHEN 325 MG PO TABS: 650.0000 mg | ORAL_TABLET | ORAL | Status: DC | PRN

## 2020-08-11 MED ORDER — DOBUTAMINE IN D5W 4-5 MG/ML-% IV SOLN
INTRAVENOUS | Status: AC
  Filled 2020-08-11: qty 250

## 2020-08-11 MED ORDER — PROTAMINE SULFATE 10 MG/ML IV SOLN
INTRAVENOUS | Status: DC | PRN
  Administered 2020-08-11: 40 mg via INTRAVENOUS

## 2020-08-11 MED ORDER — HEPARIN (PORCINE) IN NACL 1000-0.9 UT/500ML-% IV SOLN
INTRAVENOUS | Status: AC
  Filled 2020-08-11: qty 500

## 2020-08-11 MED ORDER — SODIUM CHLORIDE 0.9% FLUSH: 3.0000 mL | INTRAVENOUS | Status: DC | PRN

## 2020-08-11 MED ORDER — ALBUTEROL SULFATE HFA 108 (90 BASE) MCG/ACT IN AERS
INHALATION_SPRAY | RESPIRATORY_TRACT | Status: DC | PRN
  Administered 2020-08-11: 6 via RESPIRATORY_TRACT

## 2020-08-11 MED ORDER — HEPARIN SODIUM (PORCINE) 1000 UNIT/ML IJ SOLN
INTRAMUSCULAR | Status: DC | PRN
  Administered 2020-08-11: 1000 [IU] via INTRAVENOUS

## 2020-08-11 MED ORDER — DOBUTAMINE IN D5W 4-5 MG/ML-% IV SOLN
INTRAVENOUS | Status: DC | PRN
  Administered 2020-08-11: 20 ug/kg/min via INTRAVENOUS

## 2020-08-11 MED ORDER — SODIUM CHLORIDE 0.9 % IV SOLN: 250.0000 mL | INTRAVENOUS | Status: DC | PRN

## 2020-08-11 MED ORDER — ROCURONIUM BROMIDE 10 MG/ML (PF) SYRINGE
PREFILLED_SYRINGE | INTRAVENOUS | Status: DC | PRN
  Administered 2020-08-11: 80 mg via INTRAVENOUS

## 2020-08-11 MED ORDER — HEPARIN (PORCINE) IN NACL 1000-0.9 UT/500ML-% IV SOLN
INTRAVENOUS | Status: DC | PRN
  Administered 2020-08-11 (×5): 500 mL

## 2020-08-11 MED ORDER — PROPOFOL 10 MG/ML IV BOLUS
INTRAVENOUS | Status: DC | PRN
  Administered 2020-08-11 (×3): 50 mg via INTRAVENOUS
  Administered 2020-08-11: 150 mg via INTRAVENOUS

## 2020-08-11 MED ORDER — HEPARIN SODIUM (PORCINE) 1000 UNIT/ML IJ SOLN
INTRAMUSCULAR | Status: DC | PRN
  Administered 2020-08-11: 15000 [IU] via INTRAVENOUS
  Administered 2020-08-11: 2000 [IU] via INTRAVENOUS

## 2020-08-11 MED ORDER — ONDANSETRON HCL 4 MG/2ML IJ SOLN: 4.0000 mg | Freq: Four times a day (QID) | INTRAMUSCULAR | Status: DC | PRN

## 2020-08-11 MED ORDER — MIDAZOLAM HCL 2 MG/2ML IJ SOLN
INTRAMUSCULAR | Status: AC
  Filled 2020-08-11: qty 2

## 2020-08-11 MED ORDER — LIDOCAINE 2% (20 MG/ML) 5 ML SYRINGE
INTRAMUSCULAR | Status: DC | PRN
  Administered 2020-08-11: 60 mg via INTRAVENOUS

## 2020-08-11 MED ORDER — SODIUM CHLORIDE 0.9% FLUSH: 3.0000 mL | Freq: Two times a day (BID) | INTRAVENOUS | Status: DC

## 2020-08-11 MED ORDER — FENTANYL CITRATE (PF) 100 MCG/2ML IJ SOLN
INTRAMUSCULAR | Status: DC | PRN
  Administered 2020-08-11 (×2): 50 ug via INTRAVENOUS

## 2020-08-11 MED ORDER — MIDAZOLAM HCL 5 MG/5ML IJ SOLN
INTRAMUSCULAR | Status: DC | PRN
  Administered 2020-08-11: 2 mg via INTRAVENOUS

## 2020-08-11 SURGICAL SUPPLY — 21 items
BAG SNAP BAND KOVER 36X36 (MISCELLANEOUS) ×2 IMPLANT
BLANKET WARM UNDERBOD FULL ACC (MISCELLANEOUS) ×2 IMPLANT
CATH 8FR REPROCESSED SOUNDSTAR (CATHETERS) ×2 IMPLANT
CATH MAPPNG PENTARAY F 2-6-2MM (CATHETERS) ×1 IMPLANT
CATH S CIRCA THERM PROBE 10F (CATHETERS) ×2 IMPLANT
CATH SMTCH THERMOCOOL SF DF (CATHETERS) ×2 IMPLANT
CATH WEBSTER BI DIR CS D-F CRV (CATHETERS) ×2 IMPLANT
CLOSURE PERCLOSE PROSTYLE (VASCULAR PRODUCTS) ×8 IMPLANT
COVER SWIFTLINK CONNECTOR (BAG) ×2 IMPLANT
KIT VERSACROSS STEERABLE D1 (CATHETERS) ×2 IMPLANT
MAT PREVALON FULL STRYKER (MISCELLANEOUS) ×2 IMPLANT
PACK EP LATEX FREE (CUSTOM PROCEDURE TRAY) ×2
PACK EP LF (CUSTOM PROCEDURE TRAY) ×1 IMPLANT
PAD PRO RADIOLUCENT 2001M-C (PAD) ×2 IMPLANT
PATCH CARTO3 (PAD) ×2 IMPLANT
PENTARAY F 2-6-2MM (CATHETERS) ×2
SHEATH CARTO VIZIGO SM CVD (SHEATH) ×2 IMPLANT
SHEATH PINNACLE 7F 10CM (SHEATH) ×2 IMPLANT
SHEATH PINNACLE 8F 10CM (SHEATH) ×4 IMPLANT
SHEATH PINNACLE 9F 10CM (SHEATH) ×2 IMPLANT
TUBING SMART ABLATE COOLFLOW (TUBING) ×2 IMPLANT

## 2020-08-11 NOTE — Interval H&P Note (Signed)
History and Physical Interval Note:  08/11/2020 7:43 AM  Hannah Zimmerman  has presented today for surgery, with the diagnosis of Atrial fibrillation.  The various methods of treatment have been discussed with the patient and family. After consideration of risks, benefits and other options for treatment, the patient has consented to  Procedure(s): ATRIAL FIBRILLATION ABLATION (N/A) TRANSESOPHAGEAL ECHOCARDIOGRAM (TEE) (N/A) as a surgical intervention.  The patient's history has been reviewed, patient examined, no change in status, stable for surgery.  I have reviewed the patient's chart and labs.  Questions were answered to the patient's satisfaction.     Charie Pinkus A Artist Bloom

## 2020-08-11 NOTE — Transfer of Care (Signed)
Immediate Anesthesia Transfer of Care Note  Patient: [name]  Procedure(s) Performed: ATRIAL FIBRILLATION ABLATION (N/A ) TRANSESOPHAGEAL ECHOCARDIOGRAM (TEE) (N/A )  Patient Location: Cath Lab  Anesthesia Type:General  Level of Consciousness: awake and alert   Airway & Oxygen Therapy: Patient Spontanous Breathing and Patient connected to nasal cannula oxygen  Post-op Assessment: Report given to RN and Post -op Vital signs reviewed and stable  Post vital signs: Reviewed and stable  Last Vitals:  Vitals Value Taken Time  BP 127/81 08/11/20 1044  Temp    Pulse 87 08/11/20 1047  Resp 18 08/11/20 1047  SpO2 91 % 08/11/20 1047  Vitals shown include unvalidated device data.  Last Pain:  Vitals:   08/11/20 0709  TempSrc:   PainSc: 0-No pain         Complications: No complications documented.

## 2020-08-11 NOTE — Interval H&P Note (Signed)
History and Physical Interval Note:  08/11/2020 8:05 AM  Hannah Zimmerman  has presented today for surgery, with the diagnosis of Atrial fibrillation.  The various methods of treatment have been discussed with the patient and family. After consideration of risks, benefits and other options for treatment, the patient has consented to  Procedure(s): ATRIAL FIBRILLATION ABLATION (N/A) TRANSESOPHAGEAL ECHOCARDIOGRAM (TEE) (N/A) as a surgical intervention.  The patient's history has been reviewed, patient examined, no change in status, stable for surgery.  I have reviewed the patient's chart and labs.  Questions were answered to the patient's satisfaction.     Hannah Zimmerman [employer]

## 2020-08-11 NOTE — Progress Notes (Signed)
Echocardiogram Echocardiogram Transesophageal has been performed.  Oneal Deputy Jared Whorley 08/11/2020, 8:49 AM

## 2020-08-11 NOTE — Discharge Instructions (Signed)
Post procedure care instructions No driving for 4 days. No lifting over 5 lbs for 1 week. No vigorous or sexual activity for 1 week. You may return to work/your usual activities on 08/18/2020. Keep procedure site clean & dry. If you notice increased pain, swelling, bleeding or pus, call/return!  You may shower after 24 hours, but no soaking in baths/hot tubs/pools for 1 week.     You have an appointment set up with the [facility].  Multiple studies have shown that being followed by a dedicated atrial fibrillation clinic in addition to the standard care you receive from your other physicians improves health. We believe that enrollment in the atrial fibrillation clinic will allow [place] to better care for you.   The phone number to the [facility] is [phone]. The clinic is staffed Monday through Friday from 8:30am to 5pm.  Parking Directions: The clinic is located in the Heart and Vascular Building connected to [facility]. 1)From [address] turn on to [address] and go to the 3rd entrance  (Heart and Vascular entrance) on the right. 2)Look to the right for Heart &Vascular Parking Garage. 3)A code for the entrance is required, for December is 3007.   4)Take the elevators to the 1st floor. Registration is in the room with the glass walls at the end of the hallway.  If you have any trouble parking or locating the clinic, please don't hesitate to call [phone].

## 2020-08-11 NOTE — Anesthesia Procedure Notes (Signed)
Procedure Name: Intubation Date/Time: 08/11/2020 8:16 AM Performed by: Inda Coke, CRNA Pre-anesthesia Checklist: Patient identified, Emergency Drugs available, Suction available and Patient being monitored Patient Re-evaluated:Patient Re-evaluated prior to induction Oxygen Delivery Method: Circle System Utilized Preoxygenation: Pre-oxygenation with 100% oxygen Induction Type: IV induction Ventilation: Mask ventilation without difficulty Laryngoscope Size: Mac and 3 Grade View: Grade I Tube type: Oral Tube size: 7.0 mm Number of attempts: 1 Airway Equipment and Method: Stylet and Oral airway Placement Confirmation: ETT inserted through vocal cords under direct vision,  positive ETCO2 and breath sounds checked- equal and bilateral Secured at: 21 cm Tube secured with: Tape Dental Injury: Teeth and Oropharynx as per pre-operative assessment

## 2020-08-11 NOTE — Progress Notes (Signed)
Patient took home dose of Eliquis per Dr. Curt Bears verbal instructions.

## 2020-08-11 NOTE — Anesthesia Postprocedure Evaluation (Signed)
Anesthesia Post Note  Patient: Hannah Zimmerman  Procedure(s) Performed: ATRIAL FIBRILLATION ABLATION (N/A ) TRANSESOPHAGEAL ECHOCARDIOGRAM (TEE) (N/A )     Patient location during evaluation: PACU Anesthesia Type: General Level of consciousness: awake and alert Pain management: pain level controlled Vital Signs Assessment: post-procedure vital signs reviewed and stable Respiratory status: spontaneous breathing, nonlabored ventilation, respiratory function stable and patient connected to nasal cannula oxygen Cardiovascular status: blood pressure returned to baseline and stable Postop Assessment: no apparent nausea or vomiting Anesthetic complications: no   No complications documented.  Last Vitals:  Vitals:   08/11/20 1235 08/11/20 1250  BP: 129/88 120/72  Pulse: 81 83  Resp: 14 (!) 22  Temp:    SpO2: 90% (!) 89%    Last Pain:  Vitals:   08/11/20 1215  TempSrc:   PainSc: 0-No pain                 Delsa Walder L Kinzlee Selvy

## 2020-08-11 NOTE — CV Procedure (Signed)
    TRANSESOPHAGEAL ECHOCARDIOGRAM   NAME:  Hannah Zimmerman    MRN: [id number] DOB:  06/11/1971    ADMIT DATE: 08/11/2020  INDICATIONS: Eval of LAA Thrombus  PROCEDURE:   Informed consent was obtained prior to the procedure. The risks, benefits and alternatives for the procedure were discussed and the patient comprehended these risks.  Risks include, but are not limited to, cough, sore throat, vomiting, nausea, somnolence, esophageal and stomach trauma or perforation, bleeding, low blood pressure, aspiration, pneumonia, infection, trauma to the teeth and death.    Procedural time out performed.   Anesthesia was administered by anesthesia team concurrently used for likely atrial flutter ablation (general anesthesia).  The patient's heart rate, blood pressure, and oxygen saturation are monitored continuously during the procedure.  The transesophageal probe was inserted in the esophagus and stomach without difficulty and multiple views were obtained.   COMPLICATIONS:    There were no immediate complications.  KEY FINDINGS:  1. No left atrial appendage thrombus. 2. Moderate, central tricuspid regurgitation 3. There is a left to right shunt, likely iatrogenic in the setting of prior atrial fibrillation ablation 4. Full report to follow. 5. Further management per primary team.   Rudean Haskell, MD [place]  8:33 AM

## 2020-08-11 NOTE — Anesthesia Preprocedure Evaluation (Signed)
Anesthesia Evaluation  Patient identified by MRN, date of birth, ID band Patient awake    Reviewed: Allergy & Precautions, NPO status , Patient's Chart, lab work & pertinent test results, reviewed documented beta blocker date and time   Airway Mallampati: II  TM Distance: >3 FB Neck ROM: Full    Dental  (+) Poor Dentition, Dental Advisory Given, Missing,    Pulmonary neg pulmonary ROS, Current Smoker and Patient abstained from smoking.,    Pulmonary exam normal breath sounds clear to auscultation       Cardiovascular +CHF  Normal cardiovascular exam+ dysrhythmias (on eliquis) Atrial Fibrillation  Rhythm:Irregular Rate:Normal  TTE 2020 1. Left ventricular ejection fraction, by visual estimation, is 60 to  65%. The left ventricle has normal function. There is moderately increased  left ventricular hypertrophy.  2. Left ventricular diastolic parameters are indeterminate.  3. Global right ventricle has normal systolic function.The right  ventricular size is normal. No increase in right ventricular wall  thickness.  4. Left atrial size was severely dilated.  5. Right atrial size was mildly dilated  6. Moderately elevated pulmonary artery systolic pressure.  7. Rhythm is atrial fibrillation.    Neuro/Psych negative neurological ROS  negative psych ROS   GI/Hepatic negative GI ROS, Neg liver ROS,   Endo/Other  negative endocrine ROS  Renal/GU negative Renal ROS  negative genitourinary   Musculoskeletal negative musculoskeletal ROS (+)   Abdominal   Peds  Hematology negative hematology ROS (+)   Anesthesia Other Findings   Reproductive/Obstetrics                             Anesthesia Physical Anesthesia Plan  ASA: III  Anesthesia Plan: General   Post-op Pain Management:    Induction: Intravenous  PONV Risk Score and Plan: 2 and Midazolam, Dexamethasone and Ondansetron  Airway  Management Planned: Oral ETT  Additional Equipment:   Intra-op Plan:   Post-operative Plan: Extubation in OR  Informed Consent: I have reviewed the patients History and Physical, chart, labs and discussed the procedure including the risks, benefits and alternatives for the proposed anesthesia with the patient or authorized representative who has indicated his/her understanding and acceptance.     Dental advisory given  Plan Discussed with: CRNA  Anesthesia Plan Comments:         Anesthesia Quick Evaluation

## 2020-09-13 ENCOUNTER — Ambulatory Visit (HOSPITAL_COMMUNITY)
Admission: RE | Admit: 2020-09-13 | Discharge: 2020-09-13 | Disposition: A | Discharge status: Home / Self Care | Payer: Managed Care, Other (non HMO) | Source: Ambulatory Visit | Attending: Physician Assistant | Admitting: Physician Assistant

## 2020-09-13 ENCOUNTER — Other Ambulatory Visit: Payer: Self-pay

## 2020-09-13 VITALS — BP 138/88 | HR 85 | Ht 64.0 in | Wt 198.8 lb

## 2020-09-13 DIAGNOSIS — Z8249 Family history of ischemic heart disease and other diseases of the circulatory system: Secondary | ICD-10-CM | POA: Insufficient documentation

## 2020-09-13 DIAGNOSIS — Z7901 Long term (current) use of anticoagulants: Secondary | ICD-10-CM | POA: Insufficient documentation

## 2020-09-13 DIAGNOSIS — Z885 Allergy status to narcotic agent status: Secondary | ICD-10-CM | POA: Diagnosis not present

## 2020-09-13 DIAGNOSIS — Z9884 Bariatric surgery status: Secondary | ICD-10-CM | POA: Insufficient documentation

## 2020-09-13 DIAGNOSIS — Z79899 Other long term (current) drug therapy: Secondary | ICD-10-CM | POA: Diagnosis not present

## 2020-09-13 DIAGNOSIS — I5032 Chronic diastolic (congestive) heart failure: Secondary | ICD-10-CM | POA: Insufficient documentation

## 2020-09-13 DIAGNOSIS — Z6834 Body mass index (BMI) 34.0-34.9, adult: Secondary | ICD-10-CM | POA: Diagnosis not present

## 2020-09-13 DIAGNOSIS — F17210 Nicotine dependence, cigarettes, uncomplicated: Secondary | ICD-10-CM | POA: Diagnosis not present

## 2020-09-13 DIAGNOSIS — I4892 Unspecified atrial flutter: Secondary | ICD-10-CM | POA: Insufficient documentation

## 2020-09-13 DIAGNOSIS — I4819 Other persistent atrial fibrillation: Secondary | ICD-10-CM

## 2020-09-13 DIAGNOSIS — E669 Obesity, unspecified: Secondary | ICD-10-CM | POA: Insufficient documentation

## 2020-09-13 DIAGNOSIS — I422 Other hypertrophic cardiomyopathy: Secondary | ICD-10-CM | POA: Diagnosis not present

## 2020-09-13 NOTE — Progress Notes (Signed)
Primary Care Physician: Patient, No Pcp Per Primary Cardiologist: Dr Rockey Situ Primary Electrophysiologist: Dr Curt Bears Referring Physician: Dr Pat Patrick is a 49 y.o. female with a history of diastolic CHF, morbid obesity s/p gastric bypass, hypertrophic CM, and persistent atrial fibrillation who presents for follow up in the [facility]. Patient is on Eliquis for a CHADS2VASC score of 1. Patient continued to have episodes of afib with symptoms of generalized fatigue and dyspnea with exertion despite flecainide. She underwent afib ablation with Dr Curt Bears on 12/24/19 and repeat ablation 08/11/20. Cardiac MRI 01/28/20 confirmed HCM without obstruction.   On follow up today, patient reports that she has done well since her repeat ablation. She denies any heart racing or palpitations. She denies any CP, swallowing, or groin issues. No bleeding issues on anticoagulation.   Today, she denies symptoms of palpitaitons, chest pain, shortness of breath, orthopnea, PND, lower extremity edema, dizziness, presyncope, syncope, snoring, daytime somnolence, bleeding, or neurologic sequela. The patient is tolerating medications without difficulties and is otherwise without complaint today.    Atrial Fibrillation Risk Factors:  she does not have symptoms or diagnosis of sleep apnea. she does not have a history of rheumatic fever. The patient does have a history of early familial atrial fibrillation or other arrhythmias.  she has a BMI of Body mass index is 34.12 kg/m.Marland Kitchen Filed Weights   09/13/20 1535  Weight: 90.2 kg    Family History  Problem Relation Age of Onset  . Hypertension Mother   . Diabetes Mother   . Heart failure Mother      Atrial Fibrillation Management history:  Previous antiarrhythmic drugs: flecainide Previous cardioversions: 09/24/19, 10/17/19 Previous ablations: 12/24/19, 08/11/20 CHADS2VASC score: 1 Anticoagulation history:  Eliquis   Past Medical History:  Diagnosis Date  . (HFpEF) heart failure with preserved ejection fraction ([place])    a. 07/2019 Echo: EF 60-65%, mod LVH. Sev dil LA, mildly dil RA. Mod elev PASP.  Marland Kitchen Complication of anesthesia 20 years ago   a. spinal with first c section went too high stopped breathing, low bp with 2nd c section, no further issues with anesthesia  . Heart murmur    a. 07/2019 Echo: no significant valvular dzs.  Marland Kitchen LVH (left ventricular hypertrophy)    a. 07/2019 Echo: Mod LVH.  . Morbid obesity ([place])    a. 09/2014 s/p gastric bypass.  . Persistent atrial fibrillation ([place])    a. Dx 07/2019-->CHA2DS2VASc = 2 (diast CHF/Fem)-->Eliquis; b. 09/2018 s/p DCCV (150J (biphasic) x 1).  . Rash    both arms from old bed bug bites, healing  . Tobacco abuse    Past Surgical History:  Procedure Laterality Date  . ABDOMINAL HYSTERECTOMY    . ANKLE SURGERY     left x 2  . ATRIAL FIBRILLATION ABLATION N/A 12/24/2019   Procedure: ATRIAL FIBRILLATION ABLATION;  Surgeon: Constance Haw, MD;  Location: [place] [facility];  Service: Cardiovascular;  Laterality: N/A;  . ATRIAL FIBRILLATION ABLATION N/A 08/11/2020   Procedure: ATRIAL FIBRILLATION ABLATION;  Surgeon: Constance Haw, MD;  Location: [facility] [facility];  Service: Cardiovascular;  Laterality: N/A;  . CARDIOVERSION N/A 09/24/2019   Procedure: CARDIOVERSION;  Surgeon: Minna Merritts, MD;  Location: [facility];  Service: Cardiovascular;  Laterality: N/A;  . CARDIOVERSION N/A 10/17/2019   Procedure: CARDIOVERSION;  Surgeon: Minna Merritts, MD;  Location: [facility];  Service: Cardiovascular;  Laterality: N/A;  . CESAREAN SECTION  x 3  . LAPAROSCOPIC GASTRIC SLEEVE RESECTION WITH HIATAL HERNIA REPAIR  08/17/2015   Procedure: LAPAROSCOPIC GASTRIC SLEEVE RESECTION WITH HIATAL HERNIA REPAIR;  Surgeon: Johnathan Hausen, MD;  Location: [facility];  Service: General;;  . TEE WITHOUT CARDIOVERSION N/A 08/11/2020   Procedure:  TRANSESOPHAGEAL ECHOCARDIOGRAM (TEE);  Surgeon: Constance Haw, MD;  Location: [place] [facility];  Service: Cardiovascular;  Laterality: N/A;  . TUBAL LIGATION     x 2  . UPPER GI ENDOSCOPY  08/17/2015   Procedure: UPPER GI ENDOSCOPY;  Surgeon: Johnathan Hausen, MD;  Location: [facility];  Service: General;;    Current Outpatient Medications  Medication Sig Dispense Refill  . acetaminophen (TYLENOL) 500 MG tablet Take 1,000 mg by mouth every 6 (six) hours as needed (for headaches.).    Marland Kitchen ELIQUIS 5 MG TABS tablet TAKE 1 TABLET(5 MG) BY MOUTH TWICE DAILY (Patient taking differently: Take 5 mg by mouth 2 (two) times daily.) 180 tablet 1  . furosemide (LASIX) 40 MG tablet Take 40 mg by mouth daily as needed for edema.     . potassium chloride SA (KLOR-CON) 20 MEQ tablet Take 20 mEq by mouth daily as needed (when taking lasix).      No current facility-administered medications for this encounter.    Allergies  Allergen Reactions  . Codeine Hives       . Hydrocodone Hives    Social History   Socioeconomic History  . Marital status: Legally Separated    Spouse name: Not on file  . Number of children: Not on file  . Years of education: Not on file  . Highest education level: Not on file  Occupational History  . Not on file  Tobacco Use  . Smoking status: Current Every Day Smoker    Packs/day: 0.50    Years: 31.00    Pack years: 15.50    Types: Cigarettes    Last attempt to quit: 05/03/2015    Years since quitting: 5.3  . Smokeless tobacco: Never Used  . Tobacco comment: half pack daily  Vaping Use  . Vaping Use: Never used  Substance and Sexual Activity  . Alcohol use: Yes    Alcohol/week: 1.0 standard drink    Types: 1 Standard drinks or equivalent per week  . Drug use: No  . Sexual activity: Yes  Other Topics Concern  . Not on file  Social History Narrative  . Not on file   Social Determinants of Health   Financial Resource Strain: Not on file  Food Insecurity:  Not on file  Transportation Needs: Not on file  Physical Activity: Not on file  Stress: Not on file  Social Connections: Not on file  Intimate Partner Violence: Not on file     ROS- All systems are reviewed and negative except as per the HPI above.  Physical Exam: Vitals:   09/13/20 1535  BP: 138/88  Pulse: 85  Weight: 90.2 kg  Height: 5\' 4"  (1.626 m)    GEN- The patient is well appearing obese female, alert and oriented x 3 today.   HEENT-head normocephalic, atraumatic, sclera clear, conjunctiva pink, hearing intact, trachea midline. Lungs- Clear to ausculation bilaterally, normal work of breathing Heart- Regular rate and rhythm, no murmurs, rubs or gallops  GI- soft, NT, ND, + BS Extremities- no clubbing, cyanosis, or edema MS- no significant deformity or atrophy Skin- no rash or lesion Psych- euthymic mood, full affect Neuro- strength and sensation are intact   Wt Readings from  Last 3 Encounters:  09/13/20 90.2 kg  08/11/20 89.8 kg  07/05/20 89.3 kg    EKG today demonstrates SR HR 85, NST, PR 164, QRS 72, QTc 464  Echo 07/31/19 demonstrated  1. Left ventricular ejection fraction, by visual estimation, is 60 to  65%. The left ventricle has normal function. There is moderately increased  left ventricular hypertrophy.  2. Left ventricular diastolic parameters are indeterminate.  3. Global right ventricle has normal systolic function.The right  ventricular size is normal. No increase in right ventricular wall  thickness.  4. Left atrial size was severely dilated.  5. Right atrial size was mildly dilated  6. Moderately elevated pulmonary artery systolic pressure.  7. Rhythm is atrial fibrillation.   Epic records are reviewed at length today  CHA2DS2-VASc Score = 1  The patient's score is based upon: CHF History: No HTN History: No Diabetes History: No Stroke History: No Vascular Disease History: No      ASSESSMENT AND PLAN: 1. Persistent Atrial  Fibrillation/atrial flutter The patient's CHA2DS2-VASc score is 1, indicating a 0.6% annual risk of stroke.   S/p afib ablation on 12/24/19 with repeat ablation on 08/11/20 Patient appears to be maintaining SR. Continue Eliquis 5 mg BID with no missed doses for at least 3 months post ablation. Long term anticoagulation indicated despite low CHADS2VASC score given HCM.  Continue Lopressor 25 mg BID  2. Obesity Body mass index is 34.12 kg/m. S/p gastric bypass. Lifestyle modification was discussed and encouraged including regular physical activity and weight reduction.  3. Hypertrophic CM Noted on cardiac CT, MRI confirmed.   Follow up with Dr [name] as scheduled.     Jorja Loa PA-C [facility] [address] [place], [place] [address] [phone] 09/13/2020 3:55 PM

## 2020-10-29 ENCOUNTER — Other Ambulatory Visit: Payer: Self-pay | Admitting: Cardiovascular Disease

## 2020-10-29 NOTE — Telephone Encounter (Signed)
Pt last saw clint Fenton, PA on 09/13/20, last labs 08/03/20 Creat 0.76, age 50, weight 90.2kg, based on specified criteria pt is on appropriate dosage of Eliquis 5mg  BID for afib.  Will refill rx.

## 2020-10-29 NOTE — Telephone Encounter (Signed)
Refill request

## 2020-11-18 ENCOUNTER — Other Ambulatory Visit: Payer: Self-pay

## 2020-11-18 ENCOUNTER — Encounter: Payer: Self-pay | Admitting: Cardiology

## 2020-11-18 ENCOUNTER — Ambulatory Visit: Payer: Managed Care, Other (non HMO) | Admitting: Cardiology

## 2020-11-18 VITALS — BP 128/82 | HR 87 | Ht 64.0 in | Wt 201.0 lb

## 2020-11-18 DIAGNOSIS — I4819 Other persistent atrial fibrillation: Secondary | ICD-10-CM

## 2020-11-18 NOTE — Patient Instructions (Signed)
Medication Instructions:  Your physician recommends that you continue on your current medications as directed. Please refer to the Current Medication list given to you today.  *If you need a refill on your cardiac medications before your next appointment, please call your pharmacy*   Lab Work: None ordered   Testing/Procedures: None ordered   Follow-Up: At [facility], you and your health needs are our priority.  As part of our continuing mission to provide you with exceptional heart care, we have created designated Provider Care Teams.  These Care Teams include your primary Cardiologist (physician) and Advanced Practice Providers (APPs -  Physician Assistants and Nurse Practitioners) who all work together to provide you with the care you need, when you need it.  Your next appointment:   3 month(s)  The format for your next appointment:   In Person  Provider:   Will Camnitz, MD    Thank you for choosing [facility]!!   Kaizlee Carlino, RN [phone]     

## 2020-11-18 NOTE — Progress Notes (Signed)
Electrophysiology Office Note   Date:  11/18/2020   ID:  Hannah Zimmerman, DOB 1971-02-28, MRN [id number]  PCP:  Patient, No Pcp Per  Cardiologist:  Hannah Zimmerman Primary Electrophysiologist:  Hannah Buehrle Meredith Leeds, MD    Chief Complaint: AF   History of Present Illness: Hannah Zimmerman is a 50 y.o. female who is being seen today for the evaluation of AF at the request of No ref. provider found. Presenting today for electrophysiology evaluation.  She has a history significant for heart failure with preserved ejection fraction, morbid obesity status post gastric bypass, persistent atrial fibrillation, and tobacco abuse.  She is status post AF ablation 12/24/2019.  Her CT prior to ablation showed hypertrophic cardiomyopathy which is confirmed by MRI with greater than 15%.  She return to clinic with left atrial flutter.  She is now status post ablation 08/11/2020.  Today, denies symptoms of palpitations, chest pain, shortness of breath, orthopnea, PND, lower extremity edema, claudication, dizziness, presyncope, syncope, bleeding, or neurologic sequela. The patient is tolerating medications without difficulties.  Since her ablation she has done well.  She is noted no further episodes of palpitations.  She is able do all of her daily activities without restriction.   Past Medical History:  Diagnosis Date  . (HFpEF) heart failure with preserved ejection fraction ([place])    a. 07/2019 Echo: EF 60-65%, mod LVH. Sev dil LA, mildly dil RA. Mod elev PASP.  Marland Kitchen Complication of anesthesia 20 years ago   a. spinal with first c section went too high stopped breathing, low bp with 2nd c section, no further issues with anesthesia  . Heart murmur    a. 07/2019 Echo: no significant valvular dzs.  Marland Kitchen LVH (left ventricular hypertrophy)    a. 07/2019 Echo: Mod LVH.  . Morbid obesity ([place])    a. 09/2014 s/p gastric bypass.  . Persistent atrial fibrillation ([place])    a. Dx 07/2019-->CHA2DS2VASc = 2 (diast  CHF/Fem)-->Eliquis; b. 09/2018 s/p DCCV (150J (biphasic) x 1).  . Rash    both arms from old bed bug bites, healing  . Tobacco abuse    Past Surgical History:  Procedure Laterality Date  . ABDOMINAL HYSTERECTOMY    . ANKLE SURGERY     left x 2  . ATRIAL FIBRILLATION ABLATION N/A 12/24/2019   Procedure: ATRIAL FIBRILLATION ABLATION;  Surgeon: Constance Haw, MD;  Location: [place] [facility];  Service: Cardiovascular;  Laterality: N/A;  . ATRIAL FIBRILLATION ABLATION N/A 08/11/2020   Procedure: ATRIAL FIBRILLATION ABLATION;  Surgeon: Constance Haw, MD;  Location: [facility];  Service: Cardiovascular;  Laterality: N/A;  . CARDIOVERSION N/A 09/24/2019   Procedure: CARDIOVERSION;  Surgeon: Minna Merritts, MD;  Location: [facility];  Service: Cardiovascular;  Laterality: N/A;  . CARDIOVERSION N/A 10/17/2019   Procedure: CARDIOVERSION;  Surgeon: Minna Merritts, MD;  Location: [facility];  Service: Cardiovascular;  Laterality: N/A;  . CESAREAN SECTION     x 3  . LAPAROSCOPIC GASTRIC SLEEVE RESECTION WITH HIATAL HERNIA REPAIR  08/17/2015   Procedure: LAPAROSCOPIC GASTRIC SLEEVE RESECTION WITH HIATAL HERNIA REPAIR;  Surgeon: Johnathan Hausen, MD;  [facility];  Service: General;;  . TEE WITHOUT CARDIOVERSION N/A 08/11/2020   Procedure: TRANSESOPHAGEAL ECHOCARDIOGRAM (TEE);  Surgeon: Constance Haw, MD;  Location: [place] [facility];  Service: Cardiovascular;  Laterality: N/A;  . TUBAL LIGATION     x 2  . UPPER GI ENDOSCOPY  08/17/2015   Procedure: UPPER GI ENDOSCOPY;  Surgeon: Johnathan Hausen, MD;  [facility];  Service: General;;     Current Outpatient Medications  Medication Sig Dispense Refill  . acetaminophen (TYLENOL) 500 MG tablet Take 1,000 mg by mouth every 6 (six) hours as needed (for headaches.).    Marland Kitchen ELIQUIS 5 MG TABS tablet TAKE 1 TABLET(5 MG) BY MOUTH TWICE DAILY 180 tablet 1  . furosemide (LASIX) 40 MG tablet Take 40 mg by mouth daily as needed  for edema.     . potassium chloride SA (KLOR-CON) 20 MEQ tablet Take 20 mEq by mouth daily as needed (when taking lasix).      No current facility-administered medications for this visit.    Allergies:   Codeine and Hydrocodone   Social History:  The patient  reports that she has been smoking cigarettes. She has a 15.50 pack-year smoking history. She has never used smokeless tobacco. She reports current alcohol use of about 1.0 standard drink of alcohol per week. She reports that she does not use drugs.   Family History:  The patient's family history includes Diabetes in her mother; Heart failure in her mother; Hypertension in her mother.  ROS:  Please see the history of present illness.   Otherwise, review of systems is positive for none.   All other systems are reviewed and negative.   PHYSICAL EXAM: VS:  BP 128/82   Pulse 87   Ht 5\' 4"  (1.626 m)   Wt 201 lb (91.2 kg)   SpO2 98%   BMI 34.50 kg/m  , BMI Body mass index is 34.5 kg/m. GEN: Well nourished, well developed, in no acute distress  HEENT: normal  Neck: no JVD, carotid bruits, or masses Cardiac: RRR; no murmurs, rubs, or gallops,no edema  Respiratory:  clear to auscultation bilaterally, normal work of breathing GI: soft, nontender, nondistended, + BS MS: no deformity or atrophy  Skin: warm and dry Neuro:  Strength and sensation are intact Psych: euthymic mood, full affect  EKG:  EKG is ordered today. Personal review of the ekg ordered shows sinus rhythm, rate 87  Recent Labs: 08/03/2020: BUN 14; Creatinine, Ser 0.76; Hemoglobin 15.0; Platelets 247; Potassium 3.6; Sodium 141    Lipid Panel     Component Value Date/Time   CHOL 121 07/31/2019 0559   TRIG 68 07/31/2019 0559   HDL 35 (L) 07/31/2019 0559   CHOLHDL 3.5 07/31/2019 0559   VLDL 14 07/31/2019 0559   LDLCALC 72 07/31/2019 0559     Wt Readings from Last 3 Encounters:  11/18/20 201 lb (91.2 kg)  09/13/20 198 lb 12.8 oz (90.2 kg)  08/11/20 198 lb  (89.8 kg)      Other studies Reviewed: Additional studies/ records that were reviewed today include: Cardiac MRI 01/28/2020 Review of the above records today demonstrates:  1.  Normal LV chamber size, with hyperdynamic function, LVEF 67%.  2.  Normal RV chamber size, RVEF 70%.  3. 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 Maximal wall thickness at the mid ventricular level is 19 mm.  4. Large area of dense delayed myocardial enhancement at the mid ventricular level in the area of maximal wall thickness. Visually appears > 15% of the myocardial mass.  5. Severe left atrial enlargement. Iatrogenic left to right atrial level shunt.  6.  No findings to suggests cardiac amyloidosis.  ECV 22%.  TEE 08/11/2020 1. Left ventricular ejection fraction, by estimation, is 50 to 55%. The  left ventricle has low normal function. The left  ventricle has no regional  wall motion abnormalities. There is severe concentric left ventricular  hypertrophy. Left ventricular  diastolic function could not be evaluated.  2. Right ventricular systolic function is normal. The right ventricular  size is normal.  3. Left atrial size was severely dilated. No left atrial/left atrial  appendage thrombus was detected. The LAA emptying velocity was 36 cm/s.  4. Right atrial size was moderately dilated.  5. The mitral valve is grossly normal. Mild to moderate mitral valve  regurgitation.  6. Tricuspid valve regurgitation is moderate.  7. The aortic valve is tricuspid. There is mild calcification of the  aortic valve. There is mild thickening of the aortic valve. Aortic valve  regurgitation is not visualized.  8. The left upper pulmonary vein and left lower pulmonary vein are  abnormal.  9. Evidence of atrial level shunting detected by color flow Doppler.  ASSESSMENT AND PLAN:  1.  Persistent atrial fibrillation/atrial flutter: Currently on  Eliquis.  CHA2DS2-VASc of 1.  Status post ablation 12/24/2019.  She return to clinic in atrial flutter and is now status post repeat ablation 08/11/2020.  She remains in sinus rhythm.  No changes.  2.  Tobacco abuse: Smoking cessation encouraged  3.  Morbid obesity: Goal weight loss of 200 pounds.  Diet and exercise encouraged.  4.  Hypertrophic cardiomyopathy: Found on MRI.  She had does have LGE.  Current medicines are reviewed at length with the patient today.   The patient does not have concerns regarding her medicines.  The following changes were made today: None  Labs/ tests ordered today include:  Orders Placed This Encounter  Procedures  . EKG 12-Lead     Disposition:   FU with Clair Bardwell 3 months  Signed, Caelin Rayl Meredith Leeds, MD  11/18/2020 4:01 PM     [address] [place] [address] [phone] (office) [phone] (fax)

## 2021-02-17 ENCOUNTER — Ambulatory Visit: Payer: Managed Care, Other (non HMO) | Admitting: Cardiology

## 2021-11-03 ENCOUNTER — Other Ambulatory Visit: Payer: Self-pay

## 2021-11-03 ENCOUNTER — Emergency Department: Payer: Managed Care, Other (non HMO)

## 2021-11-03 ENCOUNTER — Inpatient Hospital Stay
Admission: EM | Admit: 2021-11-03 | Discharge: 2021-11-04 | DRG: 309 | Disposition: A | Discharge status: Home / Self Care | Payer: Managed Care, Other (non HMO) | Attending: Internal Medicine | Admitting: Internal Medicine

## 2021-11-03 DIAGNOSIS — I483 Typical atrial flutter: Secondary | ICD-10-CM | POA: Diagnosis present

## 2021-11-03 DIAGNOSIS — I248 Other forms of acute ischemic heart disease: Secondary | ICD-10-CM | POA: Diagnosis present

## 2021-11-03 DIAGNOSIS — I361 Nonrheumatic tricuspid (valve) insufficiency: Secondary | ICD-10-CM | POA: Diagnosis not present

## 2021-11-03 DIAGNOSIS — I422 Other hypertrophic cardiomyopathy: Secondary | ICD-10-CM | POA: Diagnosis present

## 2021-11-03 DIAGNOSIS — Z885 Allergy status to narcotic agent status: Secondary | ICD-10-CM | POA: Diagnosis not present

## 2021-11-03 DIAGNOSIS — Z90710 Acquired absence of both cervix and uterus: Secondary | ICD-10-CM | POA: Diagnosis not present

## 2021-11-03 DIAGNOSIS — E876 Hypokalemia: Secondary | ICD-10-CM | POA: Diagnosis present

## 2021-11-03 DIAGNOSIS — Z20822 Contact with and (suspected) exposure to covid-19: Secondary | ICD-10-CM | POA: Diagnosis present

## 2021-11-03 DIAGNOSIS — Z597 Insufficient social insurance and welfare support: Secondary | ICD-10-CM | POA: Diagnosis not present

## 2021-11-03 DIAGNOSIS — I4819 Other persistent atrial fibrillation: Secondary | ICD-10-CM | POA: Diagnosis not present

## 2021-11-03 DIAGNOSIS — F17210 Nicotine dependence, cigarettes, uncomplicated: Secondary | ICD-10-CM | POA: Diagnosis present

## 2021-11-03 DIAGNOSIS — D751 Secondary polycythemia: Secondary | ICD-10-CM | POA: Diagnosis present

## 2021-11-03 DIAGNOSIS — Z8249 Family history of ischemic heart disease and other diseases of the circulatory system: Secondary | ICD-10-CM

## 2021-11-03 DIAGNOSIS — I4891 Unspecified atrial fibrillation: Secondary | ICD-10-CM | POA: Diagnosis present

## 2021-11-03 DIAGNOSIS — I2510 Atherosclerotic heart disease of native coronary artery without angina pectoris: Secondary | ICD-10-CM | POA: Diagnosis present

## 2021-11-03 DIAGNOSIS — I484 Atypical atrial flutter: Secondary | ICD-10-CM

## 2021-11-03 DIAGNOSIS — I480 Paroxysmal atrial fibrillation: Secondary | ICD-10-CM | POA: Diagnosis not present

## 2021-11-03 DIAGNOSIS — Z9851 Tubal ligation status: Secondary | ICD-10-CM | POA: Diagnosis not present

## 2021-11-03 DIAGNOSIS — Z635 Disruption of family by separation and divorce: Secondary | ICD-10-CM | POA: Diagnosis not present

## 2021-11-03 DIAGNOSIS — I5032 Chronic diastolic (congestive) heart failure: Secondary | ICD-10-CM | POA: Diagnosis present

## 2021-11-03 DIAGNOSIS — Z9884 Bariatric surgery status: Secondary | ICD-10-CM

## 2021-11-03 DIAGNOSIS — E860 Dehydration: Secondary | ICD-10-CM | POA: Diagnosis present

## 2021-11-03 DIAGNOSIS — I959 Hypotension, unspecified: Secondary | ICD-10-CM

## 2021-11-03 HISTORY — DX: Typical atrial flutter: I48.3

## 2021-11-03 HISTORY — DX: Other hypertrophic cardiomyopathy: I42.2

## 2021-11-03 HISTORY — DX: Abnormal findings on diagnostic imaging of heart and coronary circulation: R93.1

## 2021-11-03 LAB — RESP PANEL BY RT-PCR (FLU A&B, COVID) ARPGX2
Influenza A by PCR: NEGATIVE
Influenza B by PCR: NEGATIVE
SARS Coronavirus 2 by RT PCR: NEGATIVE

## 2021-11-03 LAB — MAGNESIUM: Magnesium: 2 mg/dL (ref 1.7–2.4)

## 2021-11-03 LAB — PHOSPHORUS: Phosphorus: 3.7 mg/dL (ref 2.5–4.6)

## 2021-11-03 LAB — CBC
HCT: 52.1 % — ABNORMAL HIGH (ref 36.0–46.0)
Hemoglobin: 18.1 g/dL — ABNORMAL HIGH (ref 12.0–15.0)
MCH: 35.1 pg — ABNORMAL HIGH (ref 26.0–34.0)
MCHC: 34.7 g/dL (ref 30.0–36.0)
MCV: 101.2 fL — ABNORMAL HIGH (ref 80.0–100.0)
Platelets: 309 10*3/uL (ref 150–400)
RBC: 5.15 MIL/uL — ABNORMAL HIGH (ref 3.87–5.11)
RDW: 12.8 % (ref 11.5–15.5)
WBC: 10.9 10*3/uL — ABNORMAL HIGH (ref 4.0–10.5)
nRBC: 0 % (ref 0.0–0.2)

## 2021-11-03 LAB — BASIC METABOLIC PANEL
Anion gap: 9 (ref 5–15)
BUN: 21 mg/dL — ABNORMAL HIGH (ref 6–20)
CO2: 28 mmol/L (ref 22–32)
Calcium: 9.3 mg/dL (ref 8.9–10.3)
Chloride: 102 mmol/L (ref 98–111)
Creatinine, Ser: 0.83 mg/dL (ref 0.44–1.00)
GFR, Estimated: >60 mL/min (ref >60)
Glucose, Bld: 129 mg/dL — ABNORMAL HIGH (ref 70–99)
Potassium: 3.2 mmol/L — ABNORMAL LOW (ref 3.5–5.1)
Sodium: 139 mmol/L (ref 135–145)

## 2021-11-03 LAB — APTT: aPTT: 30 seconds (ref 24–36)

## 2021-11-03 LAB — TROPONIN I (HIGH SENSITIVITY)
Troponin I (High Sensitivity): 111 ng/L (ref <18)
Troponin I (High Sensitivity): 71 ng/L — ABNORMAL HIGH (ref <18)

## 2021-11-03 LAB — HEPARIN LEVEL (UNFRACTIONATED): Heparin Unfractionated: 0.18 IU/mL — ABNORMAL LOW (ref 0.30–0.70)

## 2021-11-03 LAB — PROTIME-INR
INR: 1 (ref 0.8–1.2)
Prothrombin Time: 12.8 seconds (ref 11.4–15.2)

## 2021-11-03 MED ORDER — HEPARIN BOLUS VIA INFUSION
2100.0000 [IU] | Freq: Once | INTRAVENOUS | Status: AC
  Administered 2021-11-03: 2100 [IU] via INTRAVENOUS
  Filled 2021-11-03: qty 2100

## 2021-11-03 MED ORDER — POTASSIUM CHLORIDE CRYS ER 20 MEQ PO TBCR: 40.0000 meq | EXTENDED_RELEASE_TABLET | Freq: Once | ORAL | Status: DC

## 2021-11-03 MED ORDER — AMIODARONE HCL IN DEXTROSE 360-4.14 MG/200ML-% IV SOLN
30.0000 mg/h | INTRAVENOUS | Status: DC
  Administered 2021-11-03: 30 mg/h via INTRAVENOUS
  Filled 2021-11-03 (×2): qty 200

## 2021-11-03 MED ORDER — DIGOXIN 0.25 MG/ML IJ SOLN
2.5000 ug/kg | Freq: Once | INTRAMUSCULAR | Status: AC
  Administered 2021-11-03: 187.5 ug via INTRAVENOUS
  Filled 2021-11-03: qty 2

## 2021-11-03 MED ORDER — SODIUM CHLORIDE 0.9 % IV BOLUS
500.0000 mL | Freq: Once | INTRAVENOUS | Status: AC
  Administered 2021-11-03: 500 mL via INTRAVENOUS

## 2021-11-03 MED ORDER — SODIUM CHLORIDE 0.9 % IV BOLUS
1000.0000 mL | Freq: Once | INTRAVENOUS | Status: AC
  Administered 2021-11-03: 500 mL via INTRAVENOUS

## 2021-11-03 MED ORDER — HEPARIN (PORCINE) 25000 UT/250ML-% IV SOLN
1050.0000 [IU]/h | INTRAVENOUS | Status: DC
  Administered 2021-11-03: 800 [IU]/h via INTRAVENOUS
  Filled 2021-11-03 (×2): qty 250

## 2021-11-03 MED ORDER — AMIODARONE HCL IN DEXTROSE 360-4.14 MG/200ML-% IV SOLN
60.0000 mg/h | INTRAVENOUS | Status: AC
  Administered 2021-11-03: 60 mg/h via INTRAVENOUS
  Filled 2021-11-03 (×2): qty 200

## 2021-11-03 MED ORDER — HEPARIN BOLUS VIA INFUSION
4000.0000 [IU] | Freq: Once | INTRAVENOUS | Status: AC
  Administered 2021-11-03: 4000 [IU] via INTRAVENOUS
  Filled 2021-11-03: qty 4000

## 2021-11-03 MED ORDER — POTASSIUM CHLORIDE CRYS ER 20 MEQ PO TBCR
40.0000 meq | EXTENDED_RELEASE_TABLET | Freq: Once | ORAL | Status: AC
  Administered 2021-11-03: 40 meq via ORAL
  Filled 2021-11-03: qty 2

## 2021-11-03 MED ORDER — ACETAMINOPHEN 500 MG PO TABS
1000.0000 mg | ORAL_TABLET | Freq: Four times a day (QID) | ORAL | Status: DC | PRN
  Administered 2021-11-03: 1000 mg via ORAL
  Filled 2021-11-03: qty 2

## 2021-11-03 MED ORDER — ONDANSETRON HCL 4 MG/2ML IJ SOLN: 4.0000 mg | Freq: Four times a day (QID) | INTRAMUSCULAR | Status: DC | PRN

## 2021-11-03 MED ORDER — ACETAMINOPHEN 325 MG PO TABS: 650.0000 mg | ORAL_TABLET | ORAL | Status: DC | PRN

## 2021-11-03 MED ORDER — AMIODARONE LOAD VIA INFUSION
150.0000 mg | Freq: Once | INTRAVENOUS | Status: AC
  Administered 2021-11-03: 150 mg via INTRAVENOUS
  Filled 2021-11-03: qty 83.34

## 2021-11-03 NOTE — H&P (Signed)
History and Physical    Hannah Zimmerman BMW:[id number] DOB: 02/22/1971 DOA: 11/03/2021  PCP: Patient, No Pcp Per (Inactive) (Confirm with patient/family/NH records and if not entered, this has to be entered at [facility] point of entry) Patient coming from: Home  I have personally briefly reviewed patient's old medical records in [place]  Chief Complaint: Palpitations  HPI: Hannah Zimmerman is a 51 y.o. female with medical history significant of PAF s/p cardioversion x3 and ablation x2, off anticoagulations, chronic HFpEF came with palpitations.  Patient underwent most recent ablation about 1 year ago and since then she has been remaining in sinus rhythm, and she was taken off of rate control medication and Eliquis things.  Starting 3 to 4 days ago, patient started to feel episode of palpitations associated with lightheadedness.  Denied loss of consciousness or fall, no chest pains.  This morning, patient started to feel palpitation become constant and came to the hospital.   ED Course: Patient was found to be in rapid A-fib no hypoxia, BP on the lower side.  Chest x-ray clear no infiltrates.  Troponin 111, potassium 3.4.  Patient was started on amiodarone drip and 1 dose of digoxin and heparin drip.  Cardiology consulted.  Review of Systems: As per HPI otherwise 14 point review of systems negative.    Past Medical History:  Diagnosis Date   (HFpEF) heart failure with preserved ejection fraction ([place])    a. 07/2019 Echo: EF 60-65%, mod LVH. Sev dil LA, mildly dil RA. Mod elev PASP.   Complication of anesthesia 20 years ago   a. spinal with first c section went too high stopped breathing, low bp with 2nd c section, no further issues with anesthesia   Heart murmur    a. 07/2019 Echo: no significant valvular dzs.   LVH (left ventricular hypertrophy)    a. 07/2019 Echo: Mod LVH.   Morbid obesity ([place])    a. 09/2014 s/p gastric bypass.   Persistent atrial fibrillation ([place])    a. Dx  07/2019-->CHA2DS2VASc = 2 (diast CHF/Fem)-->Eliquis; b. 09/2018 s/p DCCV (150J (biphasic) x 1).   Rash    both arms from old bed bug bites, healing   Tobacco abuse     Past Surgical History:  Procedure Laterality Date   ABDOMINAL HYSTERECTOMY     ANKLE SURGERY     left x 2   ATRIAL FIBRILLATION ABLATION N/A 12/24/2019   Procedure: ATRIAL FIBRILLATION ABLATION;  Surgeon: Constance Haw, MD;  Location: [place] [facility];  Service: Cardiovascular;  Laterality: N/A;   ATRIAL FIBRILLATION ABLATION N/A 08/11/2020   Procedure: ATRIAL FIBRILLATION ABLATION;  Surgeon: Constance Haw, MD;  Location: [place] [facility];  Service: Cardiovascular;  Laterality: N/A;   CARDIOVERSION N/A 09/24/2019   Procedure: CARDIOVERSION;  Surgeon: Minna Merritts, MD;  Location: [place] [facility];  Service: Cardiovascular;  Laterality: N/A;   CARDIOVERSION N/A 10/17/2019   Procedure: CARDIOVERSION;  Surgeon: Minna Merritts, MD;  Location: [facility];  Service: Cardiovascular;  Laterality: N/A;   CESAREAN SECTION     x 3   LAPAROSCOPIC GASTRIC SLEEVE RESECTION WITH HIATAL HERNIA REPAIR  08/17/2015   Procedure: LAPAROSCOPIC GASTRIC SLEEVE RESECTION WITH HIATAL HERNIA REPAIR;  Surgeon: Johnathan Hausen, MD;  [facility];  Service: General;;   TEE WITHOUT CARDIOVERSION N/A 08/11/2020   Procedure: TRANSESOPHAGEAL ECHOCARDIOGRAM (TEE);  Surgeon: Constance Haw, MD;  Location: [place] [facility];  Service: Cardiovascular;  Laterality: N/A;   TUBAL LIGATION  x 2   UPPER GI ENDOSCOPY  08/17/2015   Procedure: UPPER GI ENDOSCOPY;  Surgeon: Johnathan Hausen, MD;  [facility];  Service: General;;     reports that she has been smoking cigarettes. She has a 15.50 pack-year smoking history. She has never used smokeless tobacco. She reports current alcohol use of about 1.0 standard drink per week. She reports that she does not use drugs.  Allergies  Allergen Reactions   Codeine Hives        Hydrocodone  Hives    Family History  Problem Relation Age of Onset   Hypertension Mother    Diabetes Mother    Heart failure Mother      Prior to Admission medications   Medication Sig Start Date End Date Taking? Authorizing Provider  acetaminophen (TYLENOL) 500 MG tablet Take 1,000 mg by mouth every 6 (six) hours as needed (for headaches.).    [provider]  ELIQUIS 5 MG TABS tablet TAKE 1 TABLET(5 MG) BY MOUTH TWICE DAILY Patient not taking: Reported on 11/03/2021 10/29/20   Minna Merritts, MD  furosemide (LASIX) 40 MG tablet Take 40 mg by mouth daily as needed for edema.  Patient not taking: Reported on 11/03/2021    [provider]  potassium chloride SA (KLOR-CON) 20 MEQ tablet Take 20 mEq by mouth daily as needed (when taking lasix).  Patient not taking: Reported on 11/03/2021    [provider]    Physical Exam: Vitals:   11/03/21 1411 11/03/21 1430 11/03/21 1438 11/03/21 1444  BP: 100/81 101/89    Pulse:   (!) 132 93  Resp: 20 19 (!) 23   Temp:      SpO2: 98%  98% 94%  Weight:      Height:        Constitutional: NAD, calm, comfortable Vitals:   11/03/21 1411 11/03/21 1430 11/03/21 1438 11/03/21 1444  BP: 100/81 101/89    Pulse:   (!) 132 93  Resp: 20 19 (!) 23   Temp:      SpO2: 98%  98% 94%  Weight:      Height:       Eyes: PERRL, lids and conjunctivae normal ENMT: Mucous membranes are moist. Posterior pharynx clear of any exudate or lesions.Normal dentition.  Neck: normal, supple, no masses, no thyromegaly Respiratory: clear to auscultation bilaterally, no wheezing, no crackles. Normal respiratory effort. No accessory muscle use.  Cardiovascular: Irregular heart rate, tachycardia, no murmurs / rubs / gallops. No extremity edema. 2+ pedal pulses. No carotid bruits.  Abdomen: no tenderness, no masses palpated. No hepatosplenomegaly. Bowel sounds positive.  Musculoskeletal: no clubbing / cyanosis. No joint deformity upper and lower  extremities. Good ROM, no contractures. Normal muscle tone.  Skin: no rashes, lesions, ulcers. No induration Neurologic: CN 2-12 grossly intact. Sensation intact, DTR normal. Strength 5/5 in all 4.  Psychiatric: Normal judgment and insight. Alert and oriented x 3. Normal mood.     Labs on Admission: I have personally reviewed following labs and imaging studies  CBC: Recent Labs  Lab 11/03/21 1255  WBC 10.9*  HGB 18.1*  HCT 52.1*  MCV 101.2*  PLT 308   Basic Metabolic Panel: Recent Labs  Lab 11/03/21 1255  NA 139  K 3.2*  CL 102  CO2 28  GLUCOSE 129*  BUN 21*  CREATININE 0.83  CALCIUM 9.3   GFR: Estimated Creatinine Clearance: 80.5 mL/min (by C-G formula based on SCr of 0.83 mg/dL). Liver Function Tests:  No results for input(s): AST, ALT, ALKPHOS, BILITOT, PROT, ALBUMIN in the last 168 hours. No results for input(s): LIPASE, AMYLASE in the last 168 hours. No results for input(s): AMMONIA in the last 168 hours. Coagulation Profile: Recent Labs  Lab 11/03/21 1255  INR 1.0   Cardiac Enzymes: No results for input(s): CKTOTAL, CKMB, CKMBINDEX, TROPONINI in the last 168 hours. BNP (last 3 results) No results for input(s): PROBNP in the last 8760 hours. HbA1C: No results for input(s): HGBA1C in the last 72 hours. CBG: No results for input(s): GLUCAP in the last 168 hours. Lipid Profile: No results for input(s): CHOL, HDL, LDLCALC, TRIG, CHOLHDL, LDLDIRECT in the last 72 hours. Thyroid Function Tests: No results for input(s): TSH, T4TOTAL, FREET4, T3FREE, THYROIDAB in the last 72 hours. Anemia Panel: No results for input(s): VITAMINB12, FOLATE, FERRITIN, TIBC, IRON, RETICCTPCT in the last 72 hours. Urine analysis:    Component Value Date/Time   COLORURINE AMBER (A) 02/22/2016 2037   APPEARANCEUR CLOUDY (A) 02/22/2016 2037   LABSPEC 1.030 02/22/2016 2037   PHURINE 5.5 02/22/2016 2037   GLUCOSEU NEGATIVE 02/22/2016 2037   HGBUR NEGATIVE 02/22/2016 2037    BILIRUBINUR MODERATE (A) 02/22/2016 2037   KETONESUR >80 (A) 02/22/2016 2037   PROTEINUR NEGATIVE 02/22/2016 2037   NITRITE NEGATIVE 02/22/2016 2037   LEUKOCYTESUR NEGATIVE 02/22/2016 2037    Radiological Exams on Admission: DG Chest Port 1 View  Result Date: 11/03/2021 CLINICAL DATA:  Atrial fibrillation.  Near-syncope. EXAM: PORTABLE CHEST 1 VIEW COMPARISON:  Chest x-ray dated July 30, 2019. FINDINGS: Stable cardiomediastinal silhouette with pulmonary artery enlargement. Normal pulmonary vascularity. No focal consolidation, pleural effusion, or pneumothorax. No acute osseous abnormality. IMPRESSION: 1. No active disease. Electronically Signed   By: Titus Dubin M.D.   On: 11/03/2021 13:24    EKG: Independently reviewed.  Rapid A-fib, no acute ST changes.  Assessment/Plan Principal Problem:   A-fib [facility]) Active Problems:   Atrial fibrillation with rapid ventricular response (HCC)  (please populate well all problems here in Problem List. (For example, if patient is on BP meds at home and you resume or decide to hold them, it is a problem that needs to be her. Same for CAD, COPD, HLD and so on)  A-fib with RVR -On amiodarone loading and heparin drip.  Also received 1 dose of digoxin.  Cardiology aware. -Will keep patient NPO after midnight in case ablation needed. -TSH, UDS  Near syncope -Secondary to uncontrolled A-fib, treatment as above.  Hypokalemia -P.o. replacement x2, check magnesium and phosphorus level.  Dehydration with hemoconcentration -We will give another 500 mL bolus in addition to the 1 L bolus received in the ED.  DVT prophylaxis: Heparin drip Code Status: Full code Family Communication: None at bedside Disposition Plan: A-fib with RVR, heart rate controlled in the past, on amiodarone loading x2 days, expect more than 2 midnight hospital stay. Consults called: Cardiology Dr. Rockey Situ Admission status: PCU   Lequita Halt MD [facility] Pager  [phone]  11/03/2021, 3:08 PM

## 2021-11-03 NOTE — Consult Note (Signed)
[place] for IV Heparin Indication: chest pain/ACS  Patient Measurements: Height: 5\' 4"  (162.6 cm) Weight: 75.1 kg (165 lb 8 oz) IBW/kg (Calculated) : 54.7 Heparin Dosing Weight: 70.4 kg  Labs: Recent Labs    11/03/21 1255 11/03/21 1427 11/03/21 2040  HGB 18.1*  --   --   HCT 52.1*  --   --   PLT 309  --   --   APTT 30  --   --   LABPROT 12.8  --   --   INR 1.0  --   --   HEPARINUNFRC  --   --  0.18*  CREATININE 0.83  --   --   TROPONINIHS 111* 71*  --      Estimated Creatinine Clearance: 80.5 mL/min (by C-G formula based on SCr of 0.83 mg/dL).   Medical History: Past Medical History:  Diagnosis Date   (HFpEF) heart failure with preserved ejection fraction ([place])    a. 07/2019 Echo: EF 60-65%, mod LVH. Sev dil LA, mildly dil RA. Mod elev PASP; b. 07/2020 TEE: EF 50-55%, no rwma, sev conc LVH. Nl RV fxn. Mod dil RA. Mild-mod MR, mod TR.   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 07/2020 Cardiac CT: Cor Ca2+ = 338 (99th %'ile).   Complication of anesthesia 20 years ago   a. spinal with first c section went too high stopped breathing, low bp with 2nd c section, no further issues with anesthesia   Heart murmur    a. 07/2019 Echo: no significant valvular dzs; b. 07/2020 TEE: Mild-mod MR, mod TR.   Hypertrophic cardiomyopathy ([place])    a. 07/2019 Echo: Mod LVH; b. 01/2020 cMRI: EF 67%, HCM w/o obstruction. Max wall thickness 79mm. Sev LAE w/ L->R atrial level shunt. Large area of LGE @ mid-ventricular level in area of max wall thickness; c. 07/2020 Cardiac CT: Asymm hypertrophy up to 48mm in mid inferoseptum consistent w/ known HCM.   Morbid obesity ([place])    a. 09/2014 s/p gastric bypass.   Persistent atrial fibrillation ([place])    a. Dx 07/2019-->CHA2DS2VASc = 2 (diast CHF/Fem)-->Eliquis; b. 09/2018 s/p DCCV (150J (biphasic) x 1); c. 12/2019 RFCA/PVI; d. 07/2020 repeat RFCA/PVI.   Rash    both arms from old bed bug  bites, healing   Tobacco abuse    Typical atrial flutter ([place])    a. 12/2019 s/p RFCA.    Medications:  Apixaban 5 mg BID on PTA medication list but patient reports not taking. No evident fill history  Assessment: Patient is a 51 y/o F with medical history as above who presented to the ED 2/16 c/o Afib and near syncope. Patient reports history of ablations and is not currently taking medication for atrial fibrillation. Onset of symptoms was 3-4 days prior to presentation. Pharmacy consulted to initiate heparin infusion for ACS.   Baseline aPTT and PT-INR are within normal limits. Baseline CBC acceptable, mild polycythemia.   2/16 2040 HL 0.18  Goal of Therapy:  Heparin level 0.3-0.7 units/ml Monitor platelets by anticoagulation protocol: Yes   Plan:  2/16 2040 HL 0.18 Heparin 2100 unit IV bolus x 1  INCREASE heparin infusion to 1050 units/hr Will check HL 6 hours after rate change Daily CBC per protocol while on IV heparin  Dorothe Pea, PharmD, BCPS Clinical Pharmacist   11/03/2021,9:25 PM

## 2021-11-03 NOTE — ED Notes (Addendum)
Orders locked for Heparin rate/dose change by Virl Cagey, [facility]

## 2021-11-03 NOTE — ED Notes (Signed)
Pt's oxygen sats drop to 88% on room air while sleeping. Pt placed on 2 liters of oxygen via nasal canula while sleeping.

## 2021-11-03 NOTE — ED Triage Notes (Signed)
Pt comes with c/o AFib and feeling like she is going to pass out. Pt states hx of ablations and no current meds.  Pt states this Started 3-4 days ago. Pt states no CP or pain.

## 2021-11-03 NOTE — Consult Note (Signed)
ANTICOAGULATION CONSULT NOTE  Pharmacy Consult for IV Heparin Indication: chest pain/ACS  Patient Measurements: Height: 5\' 4"  (162.6 cm) Weight: 75.1 kg (165 lb 8 oz) IBW/kg (Calculated) : 54.7 Heparin Dosing Weight: 70.4 kg  Labs: Recent Labs    11/03/21 1255  HGB 18.1*  HCT 52.1*  PLT 309  CREATININE 0.83  TROPONINIHS 111*    Estimated Creatinine Clearance: 80.5 mL/min (by C-G formula based on SCr of 0.83 mg/dL).   Medical History: Past Medical History:  Diagnosis Date   (HFpEF) heart failure with preserved ejection fraction ([place])    a. 07/2019 Echo: EF 60-65%, mod LVH. Sev dil LA, mildly dil RA. Mod elev PASP.   Complication of anesthesia 20 years ago   a. spinal with first c section went too high stopped breathing, low bp with 2nd c section, no further issues with anesthesia   Heart murmur    a. 07/2019 Echo: no significant valvular dzs.   LVH (left ventricular hypertrophy)    a. 07/2019 Echo: Mod LVH.   Morbid obesity ([place])    a. 09/2014 s/p gastric bypass.   Persistent atrial fibrillation ([place])    a. Dx 07/2019-->CHA2DS2VASc = 2 (diast CHF/Fem)-->Eliquis; b. 09/2018 s/p DCCV (150J (biphasic) x 1).   Rash    both arms from old bed bug bites, healing   Tobacco abuse     Medications:  Apixaban 5 mg BID on PTA medication list but patient reports not taking. No evident fill history  Assessment: Patient is a 51 y/o F with medical history as above who presented to the ED 2/16 c/o Afib and near syncope. Patient reports history of ablations and is not currently taking medication for atrial fibrillation. Onset of symptoms was 3-4 days prior to presentation. Pharmacy consulted to initiate heparin infusion for ACS.   Baseline aPTT and PT-INR are within normal limits. Baseline CBC acceptable, mild polycythemia.   Goal of Therapy:  Heparin level 0.3-0.7 units/ml Monitor platelets by anticoagulation protocol: Yes   Plan:  --Heparin 4000 unit IV bolus followed by  continuous infusion at 800 units/hr --Will check HL 6 hours after initiation of infusion --Daily CBC per protocol while on IV heparin  Benita Gutter 11/03/2021,2:20 PM

## 2021-11-03 NOTE — ED Provider Notes (Signed)
[facility] Provider Note    Event Date/Time   First MD Initiated Contact with Patient 11/03/21 1256     (approximate)   History   Atrial Fibrillation   HPI  Hannah Zimmerman is a 51 y.o. female with a history of A-fib not currently on anticoagulation or rate controlling medication with history of LVH presents to the ER for evaluation of palpitations feeling she is about to pass out for the past 4 days.  She denies any chest pain.  No cough or fevers.  She was taken off of her medications about a year ago as she was not having persistent A-fib.  She has required multiple ablations in the past.  Has also required cardioversion.  Was previously on digoxin.      Physical Exam   Triage Vital Signs: ED Triage Vitals  Enc Vitals Group     BP 11/03/21 1247 (!) 79/69     Pulse Rate 11/03/21 1247 (!) 167     Resp 11/03/21 1247 18     Temp 11/03/21 1247 98 F (36.7 C)     Temp src --      SpO2 11/03/21 1247 99 %     Weight --      Height --      Head Circumference --      Peak Flow --      Pain Score 11/03/21 1252 0     Pain Loc --      Pain Edu? --      Excl. in [place]? --     Most recent vital signs: Vitals:   11/03/21 1430 11/03/21 1438  BP: 101/89   Pulse:  (!) 132  Resp: 19 (!) 23  Temp:    SpO2:  98%     Constitutional: Alert  Eyes: Conjunctivae are normal.  Head: Atraumatic. Nose: No congestion/rhinnorhea. Mouth/Throat: Mucous membranes are moist.   Neck: Painless ROM.  Cardiovascular:   Good peripheral circulation. Respiratory: Normal respiratory effort.  No retractions.  Gastrointestinal: Soft and nontender.  Musculoskeletal:  no deformity Neurologic:  MAE spontaneously. No gross focal neurologic deficits are appreciated.  Skin:  Skin is warm, dry and intact. No rash noted. Psychiatric: Mood and affect are normal. Speech and behavior are normal.    ED Results / Procedures / Treatments   Labs (all labs ordered are listed, but  only abnormal results are displayed) Labs Reviewed  BASIC METABOLIC PANEL - Abnormal; Notable for the following components:      Result Value   Potassium 3.2 (*)    Glucose, Bld 129 (*)    BUN 21 (*)    All other components within normal limits  CBC - Abnormal; Notable for the following components:   WBC 10.9 (*)    RBC 5.15 (*)    Hemoglobin 18.1 (*)    HCT 52.1 (*)    MCV 101.2 (*)    MCH 35.1 (*)    All other components within normal limits  TROPONIN I (HIGH SENSITIVITY) - Abnormal; Notable for the following components:   Troponin I (High Sensitivity) 111 (*)    All other components within normal limits  RESP PANEL BY RT-PCR (FLU A&B, COVID) ARPGX2  APTT  PROTIME-INR  HEPARIN LEVEL (UNFRACTIONATED)  TROPONIN I (HIGH SENSITIVITY)     EKG  ED ECG REPORT I, Merlyn Lot, the attending physician, personally viewed and interpreted this ECG.   Date: 11/03/2021  EKG Time: 12:43  Rate: 170  Rhythm:  afib with rvr  Axis: normal  Intervals: afib  ST&T Change: nonspecific st abn likely rate dependent    RADIOLOGY Please see ED Course for my review and interpretation.  I personally reviewed all radiographic images ordered to evaluate for the above acute complaints and reviewed radiology reports and findings.  These findings were personally discussed with the patient.  Please see medical record for radiology report.    PROCEDURES:  Critical Care performed: Yes, see critical care procedure note(s)  .Critical Care Performed by: Merlyn Lot, MD Authorized by: Merlyn Lot, MD   Critical care provider statement:    Critical care time (minutes):  40   Critical care was necessary to treat or prevent imminent or life-threatening deterioration of the following conditions:  Circulatory failure and cardiac failure   Critical care was time spent personally by me on the following activities:  Ordering and performing treatments and interventions, ordering and review  of laboratory studies, ordering and review of radiographic studies, pulse oximetry, re-evaluation of patient's condition, review of old charts, obtaining history from patient or surrogate, examination of patient, evaluation of patient's response to treatment, discussions with primary provider, discussions with consultants and development of treatment plan with patient or surrogate .1-3 Lead EKG Interpretation Performed by: Merlyn Lot, MD Authorized by: Merlyn Lot, MD     Interpretation: abnormal     ECG rate:  120   ECG rate assessment: tachycardic     Rhythm: atrial fibrillation     MEDICATIONS ORDERED IN ED: Medications  amiodarone (NEXTERONE) 1.8 mg/mL load via infusion 150 mg (150 mg Intravenous Bolus from Bag 11/03/21 1350)    Followed by  amiodarone (NEXTERONE PREMIX) 360-4.14 MG/200ML-% (1.8 mg/mL) IV infusion (60 mg/hr Intravenous New Bag/Given 11/03/21 1350)    Followed by  amiodarone (NEXTERONE PREMIX) 360-4.14 MG/200ML-% (1.8 mg/mL) IV infusion (has no administration in time range)  heparin bolus via infusion 4,000 Units (4,000 Units Intravenous Bolus from Bag 11/03/21 1436)    Followed by  heparin ADULT infusion 100 units/mL (25000 units/237mL) (800 Units/hr Intravenous New Bag/Given 11/03/21 1437)  sodium chloride 0.9 % bolus 1,000 mL (0 mLs Intravenous Stopped 11/03/21 1425)  digoxin (LANOXIN) 0.25 MG/ML injection 187.5 mcg (187.5 mcg Intravenous Given 11/03/21 1428)  potassium chloride SA (KLOR-CON M) CR tablet 40 mEq (40 mEq Oral Given 11/03/21 1437)     IMPRESSION / MDM / ASSESSMENT AND PLAN / ED COURSE  I reviewed the triage vital signs and the nursing notes.                              Differential diagnosis includes, but is not limited to, dysrhythmia, lecture light abnormality, CHF, ACS, sepsis, pneumonia, pneumothorax  Presenting to the ER for evaluation of palpitations malaise and symptoms as described above appears to be in acute A-fib with RVR  starting roughly 4 days ago not currently on anticoagulation.  Reported history of CHF but most recent echocardiogram in 2021 showed EF of 50 to 55%.  No exacerbating factors.  Patient placed on cardiac monitor bladder present for the blood differential given IV fluids.  She is mildly hypotensive but mentating well and appears well perfused.   Clinical Course as of 11/03/21 1442  Thu Nov 03, 2021  1400 Discussed case in consultation with Dr. Rockey Situ cardiology.  She is mentating well but her blood pressures are soft she has been on digoxin in the past will give slow loading dose of  digoxin in combination with amiodarone.  She is receiving IV fluids because I do suspect she is mildly dehydrated.  Despite low blood pressure she does appear well perfused and is mentating well.  We discussed possible need for cardioversion but also addressed the risk associated with this that she is not on anticoagulation.  I am getting go ahead and heparinize as her troponin is elevated although this is likely demand ischemia in the setting of her A-fib she will need anticoagulation. [PR]  1412 Heart rate is improving with amiodarone.  Blood pressure stabilizing. [PR]  1426 Reassessed.  Heart rate is improving blood pressure also stabilizing.  Does appear appropriate for admission to the hospital. [PR]    Clinical Course User Index [PR] Merlyn Lot, MD     FINAL CLINICAL IMPRESSION(S) / ED DIAGNOSES   Final diagnoses:  Atrial fibrillation with RVR ([place])     Rx / DC Orders   ED Discharge Orders     None        Note:  This document was prepared using Dragon voice recognition software and may include unintentional dictation errors.    Merlyn Lot, MD 11/03/21 [phone]

## 2021-11-03 NOTE — Consult Note (Addendum)
Cardiology Consult    Patient ID: [name] MRN: [id number], DOB/AGE: 1971-07-06   Admit date: 11/03/2021 Date of Consult: 11/03/2021  Primary Physician: Patient, No Pcp Per (Inactive) Primary Cardiologist: Hannah Rogue, MD Requesting Provider: Celesta Gentile, MD  Patient Profile    Hannah Zimmerman is a 51 y.o. female with a history of persistent atrial fibrillation status post catheter ablation, typical atrial flutter status post catheter ablation, chronic HFpEF, hypertrophic cardiomyopathy, coronary calcifications, obesity status post gastric bypass, and tobacco abuse, who is being seen today for the evaluation of persistent atrial fibrillation at the request of Hannah Zimmerman.  Past Medical History   Past Medical History:  Diagnosis Date   (HFpEF) heart failure with preserved ejection fraction ([place])    a. 07/2019 Echo: EF 60-65%, mod LVH. Sev dil LA, mildly dil RA. Mod elev PASP; b. 07/2020 TEE: EF 50-55%, no rwma, sev conc LVH. Nl RV fxn. Mod dil RA. Mild-mod MR, mod TR.   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 07/2020 Cardiac CT: Cor Ca2+ = 338 (99th %'ile).   Complication of anesthesia 20 years ago   a. spinal with first c section went too high stopped breathing, low bp with 2nd c section, no further issues with anesthesia   Heart murmur    a. 07/2019 Echo: no significant valvular dzs; b. 07/2020 TEE: Mild-mod MR, mod TR.   Hypertrophic cardiomyopathy ([place])    a. 07/2019 Echo: Mod LVH; b. 01/2020 cMRI: EF 67%, HCM w/o obstruction. Max wall thickness 65mm. Sev LAE w/ L->R atrial level shunt. Large area of LGE @ mid-ventricular level in area of max wall thickness; c. 07/2020 Cardiac CT: Asymm hypertrophy up to 25mm in mid inferoseptum consistent w/ known HCM.   Morbid obesity ([place])    a. 09/2014 s/p gastric bypass.   Persistent atrial fibrillation ([place])    a. Dx 07/2019-->CHA2DS2VASc = 2 (diast CHF/Fem)-->Eliquis; b. 09/2018 s/p DCCV (150J (biphasic) x 1);  c. 12/2019 RFCA/PVI; d. 07/2020 repeat RFCA/PVI.   Rash    both arms from old bed bug bites, healing   Tobacco abuse    Typical atrial flutter ([place])    a. 12/2019 s/p RFCA.    Past Surgical History:  Procedure Laterality Date   ABDOMINAL HYSTERECTOMY     ANKLE SURGERY     left x 2   ATRIAL FIBRILLATION ABLATION N/A 12/24/2019   Procedure: ATRIAL FIBRILLATION ABLATION;  Surgeon: Constance Haw, MD;  Location: [place] [facility];  Service: Cardiovascular;  Laterality: N/A;   ATRIAL FIBRILLATION ABLATION N/A 08/11/2020   Procedure: ATRIAL FIBRILLATION ABLATION;  Surgeon: Constance Haw, MD;  Location: [place] [facility];  Service: Cardiovascular;  Laterality: N/A;   CARDIOVERSION N/A 09/24/2019   Procedure: CARDIOVERSION;  Surgeon: Minna Merritts, MD;  Location: [place] [facility];  Service: Cardiovascular;  Laterality: N/A;   CARDIOVERSION N/A 10/17/2019   Procedure: CARDIOVERSION;  Surgeon: Minna Merritts, MD;  Location: [facility];  Service: Cardiovascular;  Laterality: N/A;   CESAREAN SECTION     x 3   LAPAROSCOPIC GASTRIC SLEEVE RESECTION WITH HIATAL HERNIA REPAIR  08/17/2015   Procedure: LAPAROSCOPIC GASTRIC SLEEVE RESECTION WITH HIATAL HERNIA REPAIR;  Surgeon: Johnathan Hausen, MD;  Location: [facility];  Service: General;;   TEE WITHOUT CARDIOVERSION N/A 08/11/2020   Procedure: TRANSESOPHAGEAL ECHOCARDIOGRAM (TEE);  Surgeon: Constance Haw, MD;  Location: [place] [facility];  Service: Cardiovascular;  Laterality: N/A;   TUBAL  LIGATION     x 2   UPPER GI ENDOSCOPY  08/17/2015   Procedure: UPPER GI ENDOSCOPY;  Surgeon: Johnathan Hausen, MD;  Location: [facility];  Service: General;;     Allergies  Allergies  Allergen Reactions   Codeine Hives        Hydrocodone Hives    History of Present Illness    51 year old female with the above past medical history including persistent atrial fibrillation, typical atrial flutter, chronic HFpEF, hypertrophic cardiomyopathy, coronary  calcifications, obesity status post gastric bypass, and tobacco abuse.  In November 2020, she presented to [facility] with a 2-week history of increasing leg swelling and 15 pound weight gain.  She was found to be in atrial fibrillation.  Echo showed normal LV function with moderate LVH and severely dilated left atrium.  She was aggressively diuresed and subsequently underwent successful cardioversion in early January 2021.  She had ERAF and was placed on flecainide 50 mg twice daily and underwent repeat cardioversion in late January 2021, which was only successful for about 12 hours.  She subsequently evaluated by EP and in April 2021, underwent catheter ablations for atrial fibrillation and flutter.  She was maintained on flecainide and Eliquis postprocedure but later in the year developed left-sided atrial flutter and subsequently underwent repeat catheter ablation in November 2021.  She was last seen in cardiology clinic in March 2022, at which time she was doing well.  She subsequently came off of Eliquis in June 2022.  She was in her usoh until ~ 1 wk ago, when she had a hot flash and this was followed by tachypalpitations.  She believes that she's been in afib/flutter since last Friday, with variable rates.  Over the past 3-4 days, heart rates have been higher and tachycardia more persistent.  This became associated with lightheadedness, and as a result, she presented to the ED today.  Here, she was found to be in rapid atypical atrial flutter, initially in the 170's.  She was initially hypotensive @ 79/69.  In the setting hypotension, she was given IV digoxin (187.5 mcg) and started on amio and heparin.  Rates have been trending in the low 100s to 1 teens.  She currently denies chest pain or dyspnea.  Inpatient Medications     potassium chloride  40 mEq Oral Once  Amiodarone infusion Heparin infusion  Prior to Admission medications   Medication Sig Start Date End Date Taking? Authorizing  Provider  acetaminophen (TYLENOL) 500 MG tablet Take 1,000 mg by mouth every 6 (six) hours as needed (for headaches.).    [provider]  furosemide (LASIX) 40 MG tablet Take 40 mg by mouth daily as needed for edema.  Patient not taking: Reported on 11/03/2021    [provider]  potassium chloride SA (KLOR-CON) 20 MEQ tablet Take 20 mEq by mouth daily as needed (when taking lasix).  Patient not taking: Reported on 11/03/2021    [provider]     Family History    Family History  Problem Relation Age of Onset   Hypertension Mother    Diabetes Mother    Heart failure Mother    She indicated that her mother is alive. She indicated that her father is alive. She indicated that her sister is alive. She indicated that both of her brothers are alive.   Social History    Social History   Socioeconomic History   Marital status: Legally Separated    Spouse name: Not on file  Number of children: Not on file   Years of education: Not on file   Highest education level: Not on file  Occupational History   Not on file  Tobacco Use   Smoking status: Every Day    Packs/day: 0.50    Years: 31.00    Pack years: 15.50    Types: Cigarettes    Last attempt to quit: 05/03/2015    Years since quitting: 6.5   Smokeless tobacco: Never   Tobacco comments:    half pack daily  Vaping Use   Vaping Use: Never used  Substance and Sexual Activity   Alcohol use: Yes    Alcohol/week: 1.0 standard drink    Types: 1 Standard drinks or equivalent per week   Drug use: No   Sexual activity: Yes  Other Topics Concern   Not on file  Social History Narrative   Not on file   Social Determinants of Health   Financial Resource Strain: Not on file  Food Insecurity: Not on file  Transportation Needs: Not on file  Physical Activity: Not on file  Stress: Not on file  Social Connections: Not on file  Intimate Partner Violence: Not on file     Review of Systems    General:   No chills, fever, night sweats or weight changes.  Cardiovascular:  No chest pain, +++ dyspnea on exertion, no edema, orthopnea, +++ palpitations, no paroxysmal nocturnal dyspnea. Dermatological: No rash, lesions/masses Respiratory: No cough, +++ dyspnea Urologic: No hematuria, dysuria Abdominal:   No nausea, vomiting, diarrhea, bright red blood per rectum, melena, or hematemesis Neurologic:  No visual changes, wkns, changes in mental status. All other systems reviewed and are otherwise negative except as noted above.  Physical Exam    Blood pressure 95/79, pulse 61, temperature 98 F (36.7 C), resp. rate 16, height 5\' 4"  (1.626 m), weight 75.1 kg, SpO2 96 %.  General: Pleasant, NAD Psych: Normal affect. Neuro: Alert and oriented X 3. Moves all extremities spontaneously. HEENT: Normal  Neck: Supple without bruits or JVD. Lungs:  Resp regular and unlabored, CTA. Heart: Irregularly irregular, tachycardic, no s3, s4, or murmurs. Abdomen: Soft, non-tender, non-distended, BS + x 4.  Extremities: No clubbing, cyanosis or edema. DP/PT2+, Radials 2+ and equal bilaterally.  Labs    Cardiac Enzymes Recent Labs  Lab 11/03/21 1255 11/03/21 1427  TROPONINIHS 111* 71*      Lab Results  Component Value Date   WBC 10.9 (H) 11/03/2021   HGB 18.1 (H) 11/03/2021   HCT 52.1 (H) 11/03/2021   MCV 101.2 (H) 11/03/2021   PLT 309 11/03/2021    Recent Labs  Lab 11/03/21 1255  NA 139  K 3.2*  CL 102  CO2 28  BUN 21*  CREATININE 0.83  CALCIUM 9.3  GLUCOSE 129*   Lab Results  Component Value Date   CHOL 121 07/31/2019   HDL 35 (L) 07/31/2019   LDLCALC 72 07/31/2019   TRIG 68 07/31/2019     Radiology Studies    DG Chest Port 1 View  Result Date: 11/03/2021 CLINICAL DATA:  Atrial fibrillation.  Near-syncope. EXAM: PORTABLE CHEST 1 VIEW COMPARISON:  Chest x-ray dated July 30, 2019. FINDINGS: Stable cardiomediastinal silhouette with pulmonary artery enlargement. Normal pulmonary  vascularity. No focal consolidation, pleural effusion, or pneumothorax. No acute osseous abnormality. IMPRESSION: 1. No active disease. Electronically Signed   By: Titus Dubin M.D.   On: 11/03/2021 13:24    ECG & Cardiac Imaging    Afib  vs atypical flutter, 171, inferolateral ST depression - personally reviewed.  Assessment & Plan    1.  Persistent atypical atrial flutter: Patient with prior history of A-fib and flutter status post catheter ablations in April 2021 with touchup ablation for atypical flutter in November 2021.  She came off of anticoagulation in June 2022 in the setting of lack of insurance, and has been lost to follow-up.  Over the past week, she has noticed an increase in tachypalpitations, with more persistent tachycardia over the past 3 to 4 days.  She presented to the ED today and was found to be in rapid atypical atrial flutter in the 170s.  She was also hypotensive, limiting medical therapy.  She was placed on amiodarone and provided IV digoxin with improved rates, though she still remains mildly tachycardic.  Currently she is asymptomatic.  She had previously been managed with flecainide and metoprolol but again notes today that metoprolol caused hypotension in the past.  Given duration of greater than 48 hours in the absence of anticoagulation, we do have concern with ongoing amiodarone usage however in the setting of hypotension, options are limited.  If rates remain poorly controlled in the morning, we will likely plan on TEE and cardioversion at that point, can consider switching her back to flecainide 100 mg twice daily with low-dose beta-blocker if appropriate, and plan for follow-up with Dr. Curt Bears as soon as possible.  Of note, though flecainide and low-dose beta-blocker were unsuccessful in keeping her in sinus rhythm in the past, she was at least rate controlled on those agents, and may have a stronger likelihood of maintaining sinus rhythm now that she is status post  ablation x2.  Cont heparin for now and can transition to eliquis tomorrow after decision re: TEE/DCCV.  2.  Chronic HFpEF/hypertrophic cardiomyopathy: Currently euvolemic.  No role for diuresis at this time.  3.  Hypokalemia: Potassium 3.2 on arrival.  This has been supplemented.  4.  Tobacco abuse: Cessation advised.  Signed, Murray Hodgkins, NP 11/03/2021, 5:29 PM  For questions or updates, please contact   Please consult [email or web address] for contact info under Cardiology/STEMI.

## 2021-11-04 ENCOUNTER — Other Ambulatory Visit: Payer: Self-pay | Admitting: Nurse Practitioner

## 2021-11-04 DIAGNOSIS — I4891 Unspecified atrial fibrillation: Secondary | ICD-10-CM

## 2021-11-04 DIAGNOSIS — I484 Atypical atrial flutter: Secondary | ICD-10-CM

## 2021-11-04 LAB — HEPARIN LEVEL (UNFRACTIONATED)
Heparin Unfractionated: 0.48 IU/mL (ref 0.30–0.70)
Heparin Unfractionated: 0.32 IU/mL (ref 0.30–0.70)

## 2021-11-04 LAB — CBC
HCT: 44.4 % (ref 36.0–46.0)
Hemoglobin: 15.1 g/dL — ABNORMAL HIGH (ref 12.0–15.0)
MCH: 34.6 pg — ABNORMAL HIGH (ref 26.0–34.0)
MCHC: 34 g/dL (ref 30.0–36.0)
MCV: 101.6 fL — ABNORMAL HIGH (ref 80.0–100.0)
Platelets: 202 10*3/uL (ref 150–400)
RBC: 4.37 MIL/uL (ref 3.87–5.11)
RDW: 12.6 % (ref 11.5–15.5)
WBC: 5.6 10*3/uL (ref 4.0–10.5)
nRBC: 0 % (ref 0.0–0.2)

## 2021-11-04 LAB — HIV ANTIBODY (ROUTINE TESTING W REFLEX): HIV Screen 4th Generation wRfx: NONREACTIVE

## 2021-11-04 MED ORDER — METOPROLOL TARTRATE 25 MG PO TABS
25.0000 mg | ORAL_TABLET | Freq: Two times a day (BID) | ORAL | 0 refills | Status: DC
Start: 2021-11-04 — End: 2021-11-30

## 2021-11-04 MED ORDER — METOPROLOL TARTRATE 25 MG PO TABS
25.0000 mg | ORAL_TABLET | Freq: Two times a day (BID) | ORAL | Status: DC
  Administered 2021-11-04: 25 mg via ORAL
  Filled 2021-11-04: qty 1

## 2021-11-04 MED ORDER — APIXABAN 5 MG PO TABS: 5.0000 mg | ORAL_TABLET | Freq: Two times a day (BID) | ORAL | 0 refills | Status: DC

## 2021-11-04 MED ORDER — APIXABAN 5 MG PO TABS
5.0000 mg | ORAL_TABLET | Freq: Two times a day (BID) | ORAL | Status: DC
Start: 2021-11-04 — End: 2021-11-04
  Administered 2021-11-04: 5 mg via ORAL
  Filled 2021-11-04: qty 1

## 2021-11-04 NOTE — Progress Notes (Addendum)
Progress Note  Patient Name: Hannah Zimmerman Date of Encounter: 11/04/2021  Primary Cardiologist: [name], MD  Subjective   Feels well this AM.  Remains in afib and continues to note palpitations.  Interested in pursuing TEE and cardioversion as rates while awake remain over 100.  Inpatient Medications    Scheduled Meds:  potassium chloride  40 mEq Oral Once   Continuous Infusions:  amiodarone 30 mg/hr (11/04/21 0033)   heparin 1,050 Units/hr (11/03/21 2233)   PRN Meds: acetaminophen, acetaminophen, ondansetron (ZOFRAN) IV   Vital Signs    Vitals:   11/04/21 0400 11/04/21 0430 11/04/21 0500 11/04/21 0700  BP: 98/86 113/84 (!) 114/96 (!) 120/102  Pulse:    (!) 49  Resp: 16 18 15 20   Temp:      SpO2:    98%  Weight:      Height:        Intake/Output Summary (Last 24 hours) at 11/04/2021 0725 Last data filed at 11/04/2021 0033 Gross per 24 hour  Intake 1334.22 ml  Output --  Net 1334.22 ml   Filed Weights   11/03/21 1402  Weight: 75.1 kg    Physical Exam   GEN: Well nourished, well developed, in no acute distress.  HEENT: Grossly normal.  Neck: Supple, no JVD, carotid bruits, or masses. Cardiac: Irregularly irregular, tachycardic, no murmurs, rubs, or gallops. No clubbing, cyanosis, edema.  Radials 2+, DP/PT 2+ and equal bilaterally.  Respiratory:  Respirations regular and unlabored, clear to auscultation bilaterally. GI: Soft, nontender, nondistended, BS + x 4. MS: no deformity or atrophy. Skin: warm and dry, no rash. Neuro:  Strength and sensation are intact. Psych: AAOx3.  Normal affect.  Labs    Chemistry Recent Labs  Lab 11/03/21 1255  NA 139  K 3.2*  CL 102  CO2 28  GLUCOSE 129*  BUN 21*  CREATININE 0.83  CALCIUM 9.3  GFRNONAA >60  ANIONGAP 9     Hematology Recent Labs  Lab 11/03/21 1255 11/04/21 0357  WBC 10.9* 5.6  RBC 5.15* 4.37  HGB 18.1* 15.1*  HCT 52.1* 44.4  MCV 101.2* 101.6*  MCH 35.1* 34.6*  MCHC 34.7 34.0   RDW 12.8 12.6  PLT 309 202    Cardiac Enzymes  Recent Labs  Lab 11/03/21 1255 11/03/21 1427  TROPONINIHS 111* 71*     Lipids  Lab Results  Component Value Date   CHOL 121 07/31/2019   HDL 35 (L) 07/31/2019   LDLCALC 72 07/31/2019   TRIG 68 07/31/2019   CHOLHDL 3.5 07/31/2019    Radiology    DG Chest Port 1 View  Result Date: 11/03/2021 CLINICAL DATA:  Atrial fibrillation.  Near-syncope. EXAM: PORTABLE CHEST 1 VIEW COMPARISON:  Chest x-ray dated July 30, 2019. FINDINGS: Stable cardiomediastinal silhouette with pulmonary artery enlargement. Normal pulmonary vascularity. No focal consolidation, pleural effusion, or pneumothorax. No acute osseous abnormality. IMPRESSION: 1. No active disease. Electronically Signed   By: Titus Dubin M.D.   On: 11/03/2021 13:24    Telemetry    Persistent atrial fibrillation versus atypical atrial flutter, 70s to 90s overnight though now greater than 100 while awake- Personally Reviewed  Cardiac Studies   -----------------------  Patient Profile     51 y.o. female with a history of persistent atrial fibrillation status post catheter ablation, typical atrial flutter status post Ablation, chronic HFpEF, hypertrophic cardiomyopathy, coronary calcifications, obesity status post gastric bypass, and ongoing tobacco abuse, who presented to the [facility] ED on February 16  with a 6-day history of tachypalpitations and persistent A-fib/flutter.  Assessment & Plan    1.  Persistent atrial fibrillation/atypical atrial flutter: Status post catheter ablations in April 2021 with touchup ablation for atypical flutter in November 2021.  She came off of anticoagulation in June 2022 in the setting of lack of insurance.  Over the past week, she has been experiencing tachypalpitations, which have been more persistent over the past 3 to 4 days, prompting ED evaluation February 16, at which time she was noted to be in atrial flutter at 170 bpm.  She was initially  hypotensive and treated with IV digoxin and subsequently intravenous amiodarone.  She has remained on amiodarone overnight and rates were mostly in the 70-90 range.  Currently 100-120 while sitting up and awake.  Still with some palpitations though overall improved from the time of presentation.  Discussed options for management today, and we mutually agreed that proceeding with TEE and cardioversion is the safest and most appropriate approach.  I will try and get this scheduled for sometime around lunch.  If we're unable to perform TEE/DCCV today, we will start metoprolol 25 bid, and look to d/c amio if rate controlled.  Pt would like to go home if at all possible, so if rate-controlled, would add eliquis and arrange for outpt EP f/u for consideration of touchup ablation versus resumption of antiarrhythmics.  2.  Chronic HFpEF/hypertrophic cardiomyopathy: Euvolemic.  3.  Elevated troponin/demand ischemia: Troponin was 111 on arrival with subsequent value of 71.  She has not been experiencing chest pain.  Suspect demand ischemia in the setting of rapid atrial flutter and history of hypertrophic cardiomyopathy.  TEE pending to evaluate LV function.  We will need to consider ischemic evaluation in the inpatient versus outpatient setting depending on LV function, especially if we are to consider resumption of flecainide, which she was on before.  She is currently on heparin which be transitioned to Eliquis post cardioversion.  4.  Hypokalemia: Potassium was 3.2 on arrival.  This was supplemented.  Pending this morning.  5.  Ongoing tobacco abuse: Cessation advised.  Signed, Murray Hodgkins, NP  11/04/2021, 7:25 AM    For questions or updates, please contact   Please consult [email or web address] for contact info under Cardiology/STEMI.

## 2021-11-04 NOTE — H&P (View-Only) (Signed)
Progress Note  Patient Name: Hannah Zimmerman Date of Encounter: 11/04/2021  Primary Cardiologist: [name], MD  Subjective   Feels well this AM.  Remains in afib and continues to note palpitations.  Interested in pursuing TEE and cardioversion as rates while awake remain over 100.  Inpatient Medications    Scheduled Meds:  potassium chloride  40 mEq Oral Once   Continuous Infusions:  amiodarone 30 mg/hr (11/04/21 0033)   heparin 1,050 Units/hr (11/03/21 2233)   PRN Meds: acetaminophen, acetaminophen, ondansetron (ZOFRAN) IV   Vital Signs    Vitals:   11/04/21 0400 11/04/21 0430 11/04/21 0500 11/04/21 0700  BP: 98/86 113/84 (!) 114/96 (!) 120/102  Pulse:    (!) 49  Resp: 16 18 15 20   Temp:      SpO2:    98%  Weight:      Height:        Intake/Output Summary (Last 24 hours) at 11/04/2021 0725 Last data filed at 11/04/2021 0033 Gross per 24 hour  Intake 1334.22 ml  Output --  Net 1334.22 ml   Filed Weights   11/03/21 1402  Weight: 75.1 kg    Physical Exam   GEN: Well nourished, well developed, in no acute distress.  HEENT: Grossly normal.  Neck: Supple, no JVD, carotid bruits, or masses. Cardiac: Irregularly irregular, tachycardic, no murmurs, rubs, or gallops. No clubbing, cyanosis, edema.  Radials 2+, DP/PT 2+ and equal bilaterally.  Respiratory:  Respirations regular and unlabored, clear to auscultation bilaterally. GI: Soft, nontender, nondistended, BS + x 4. MS: no deformity or atrophy. Skin: warm and dry, no rash. Neuro:  Strength and sensation are intact. Psych: AAOx3.  Normal affect.  Labs    Chemistry Recent Labs  Lab 11/03/21 1255  NA 139  K 3.2*  CL 102  CO2 28  GLUCOSE 129*  BUN 21*  CREATININE 0.83  CALCIUM 9.3  GFRNONAA >60  ANIONGAP 9     Hematology Recent Labs  Lab 11/03/21 1255 11/04/21 0357  WBC 10.9* 5.6  RBC 5.15* 4.37  HGB 18.1* 15.1*  HCT 52.1* 44.4  MCV 101.2* 101.6*  MCH 35.1* 34.6*  MCHC 34.7 34.0   RDW 12.8 12.6  PLT 309 202    Cardiac Enzymes  Recent Labs  Lab 11/03/21 1255 11/03/21 1427  TROPONINIHS 111* 71*     Lipids  Lab Results  Component Value Date   CHOL 121 07/31/2019   HDL 35 (L) 07/31/2019   LDLCALC 72 07/31/2019   TRIG 68 07/31/2019   CHOLHDL 3.5 07/31/2019    Radiology    DG Chest Port 1 View  Result Date: 11/03/2021 CLINICAL DATA:  Atrial fibrillation.  Near-syncope. EXAM: PORTABLE CHEST 1 VIEW COMPARISON:  Chest x-ray dated July 30, 2019. FINDINGS: Stable cardiomediastinal silhouette with pulmonary artery enlargement. Normal pulmonary vascularity. No focal consolidation, pleural effusion, or pneumothorax. No acute osseous abnormality. IMPRESSION: 1. No active disease. Electronically Signed   By: Titus Dubin M.D.   On: 11/03/2021 13:24    Telemetry    Persistent atrial fibrillation versus atypical atrial flutter, 70s to 90s overnight though now greater than 100 while awake- Personally Reviewed  Cardiac Studies   -----------------------  Patient Profile     51 y.o. female with a history of persistent atrial fibrillation status post catheter ablation, typical atrial flutter status post Ablation, chronic HFpEF, hypertrophic cardiomyopathy, coronary calcifications, obesity status post gastric bypass, and ongoing tobacco abuse, who presented to the [facility] ED on February 16  with a 6-day history of tachypalpitations and persistent A-fib/flutter.  Assessment & Plan    1.  Persistent atrial fibrillation/atypical atrial flutter: Status post catheter ablations in April 2021 with touchup ablation for atypical flutter in November 2021.  She came off of anticoagulation in June 2022 in the setting of lack of insurance.  Over the past week, she has been experiencing tachypalpitations, which have been more persistent over the past 3 to 4 days, prompting ED evaluation February 16, at which time she was noted to be in atrial flutter at 170 bpm.  She was initially  hypotensive and treated with IV digoxin and subsequently intravenous amiodarone.  She has remained on amiodarone overnight and rates were mostly in the 70-90 range.  Currently 100-120 while sitting up and awake.  Still with some palpitations though overall improved from the time of presentation.  Discussed options for management today, and we mutually agreed that proceeding with TEE and cardioversion is the safest and most appropriate approach.  I will try and get this scheduled for sometime around lunch.  If we're unable to perform TEE/DCCV today, we will start metoprolol 25 bid, and look to d/c amio if rate controlled.  Pt would like to go home if at all possible, so if rate-controlled, would add eliquis and arrange for outpt EP f/u for consideration of touchup ablation versus resumption of antiarrhythmics.  2.  Chronic HFpEF/hypertrophic cardiomyopathy: Euvolemic.  3.  Elevated troponin/demand ischemia: Troponin was 111 on arrival with subsequent value of 71.  She has not been experiencing chest pain.  Suspect demand ischemia in the setting of rapid atrial flutter and history of hypertrophic cardiomyopathy.  TEE pending to evaluate LV function.  We will need to consider ischemic evaluation in the inpatient versus outpatient setting depending on LV function, especially if we are to consider resumption of flecainide, which she was on before.  She is currently on heparin which be transitioned to Eliquis post cardioversion.  4.  Hypokalemia: Potassium was 3.2 on arrival.  This was supplemented.  Pending this morning.  5.  Ongoing tobacco abuse: Cessation advised.  Signed, Murray Hodgkins, NP  11/04/2021, 7:25 AM    For questions or updates, please contact   Please consult [email or web address] for contact info under Cardiology/STEMI.

## 2021-11-04 NOTE — TOC CM/SW Note (Signed)
Patient has orders to discharge home today. Chart reviewed. On room air. No wounds. No TOC needs identified. CSW signing off.  Dayton Scrape, [place]

## 2021-11-04 NOTE — ED Notes (Signed)
Informed RN bed assigned 

## 2021-11-04 NOTE — Consult Note (Signed)
[place] for IV Heparin Indication: chest pain/ACS  Patient Measurements: Height: 5\' 4"  (162.6 cm) Weight: 75.1 kg (165 lb 8 oz) IBW/kg (Calculated) : 54.7 Heparin Dosing Weight: 70.4 kg  Labs: Recent Labs    11/03/21 1255 11/03/21 1427 11/03/21 2040 11/04/21 0357  HGB 18.1*  --   --  15.1*  HCT 52.1*  --   --  44.4  PLT 309  --   --  202  APTT 30  --   --   --   LABPROT 12.8  --   --   --   INR 1.0  --   --   --   HEPARINUNFRC  --   --  0.18* 0.48  CREATININE 0.83  --   --   --   TROPONINIHS 111* 71*  --   --      Estimated Creatinine Clearance: 80.5 mL/min (by C-G formula based on SCr of 0.83 mg/dL).   Medical History: Past Medical History:  Diagnosis Date   (HFpEF) heart failure with preserved ejection fraction ([place])    a. 07/2019 Echo: EF 60-65%, mod LVH. Sev dil LA, mildly dil RA. Mod elev PASP; b. 07/2020 TEE: EF 50-55%, no rwma, sev conc LVH. Nl RV fxn. Mod dil RA. Mild-mod MR, mod TR.   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 07/2020 Cardiac CT: Cor Ca2+ = 338 (99th %'ile).   Complication of anesthesia 20 years ago   a. spinal with first c section went too high stopped breathing, low bp with 2nd c section, no further issues with anesthesia   Heart murmur    a. 07/2019 Echo: no significant valvular dzs; b. 07/2020 TEE: Mild-mod MR, mod TR.   Hypertrophic cardiomyopathy ([place])    a. 07/2019 Echo: Mod LVH; b. 01/2020 cMRI: EF 67%, HCM w/o obstruction. Max wall thickness 33mm. Sev LAE w/ L->R atrial level shunt. Large area of LGE @ mid-ventricular level in area of max wall thickness; c. 07/2020 Cardiac CT: Asymm hypertrophy up to 76mm in mid inferoseptum consistent w/ known HCM.   Morbid obesity ([place])    a. 09/2014 s/p gastric bypass.   Persistent atrial fibrillation ([place])    a. Dx 07/2019-->CHA2DS2VASc = 2 (diast CHF/Fem)-->Eliquis; b. 09/2018 s/p DCCV (150J (biphasic) x 1); c. 12/2019 RFCA/PVI; d. 07/2020  repeat RFCA/PVI.   Rash    both arms from old bed bug bites, healing   Tobacco abuse    Typical atrial flutter ([place])    a. 12/2019 s/p RFCA.    Medications:  Apixaban 5 mg BID on PTA medication list but patient reports not taking. No evident fill history  Assessment: Patient is a 51 y/o F with medical history as above who presented to the ED 2/16 c/o Afib and near syncope. Patient reports history of ablations and is not currently taking medication for atrial fibrillation. Onset of symptoms was 3-4 days prior to presentation. Pharmacy consulted to initiate heparin infusion for ACS.   Baseline aPTT and PT-INR are within normal limits. Baseline CBC acceptable, mild polycythemia.   2/16 2040 HL 0.18  Goal of Therapy:  Heparin level 0.3-0.7 units/ml Monitor platelets by anticoagulation protocol: Yes   Plan:  2/17:  HL @ 0357 = 0.48, therapeutic X 1  Will continue pt on current rate and draw confirmation level in 6 hrs @ 1000.   Orene Desanctis, PharmD Clinical Pharmacist   11/04/2021,5:05 AM

## 2021-11-05 NOTE — Discharge Summary (Signed)
Physician Discharge Summary   Patient: Hannah Zimmerman MRN: [id number] DOB: 1971-01-15  Admit date:     11/03/2021  Discharge date: 11/04/2021  Discharge Physician: Max Sane   PCP: Patient, No Pcp Per (Inactive)   Recommendations at discharge:   Follow-up with outpatient providers as requested  Discharge Diagnoses: Principal Problem:   A-fib [facility]) Active Problems:   Atrial fibrillation with rapid ventricular response [facility])  Hospital Course: 51 year old female admitted for persistent A-fib.   Persistent A-fib with RVR -Rate is controlled at this time  patient was seen by cardiology with plan was to proceed with TEE guided cardioversion but there were no availability in the lab.  Patient was also anxious to leave and did not have much symptoms.  After discussion with cardiology and patient she was discharged home with medical management. -Metoprolol 25 mg p.o. twice daily and anticoagulation with Eliquis 5 mg p.o. twice daily. She is scheduled for outpatient cardioversion on coming Tuesday 2/21  Near syncope Now symptoms resolved.  This was likely secondary to uncontrolled A-fib  Hypokalemia Repleted and resolved  Dehydration Improved with IV hydration.  Encourage oral hydration       Consultants: Cardiology Disposition: Home Diet recommendation:  Discharge Diet Orders (From admission, onward)     Start     Ordered   11/04/21 0000  Diet - low sodium heart healthy        11/04/21 1458           Cardiac diet  DISCHARGE MEDICATION: Allergies as of 11/04/2021       Reactions   Codeine Hives      Hydrocodone Hives        Medication List     STOP taking these medications    furosemide 40 MG tablet Commonly known as: LASIX   potassium chloride SA 20 MEQ tablet Commonly known as: KLOR-CON M       TAKE these medications    acetaminophen 500 MG tablet Commonly known as: TYLENOL Take 1,000 mg by mouth every 6 (six) hours as needed (for  headaches.).   apixaban 5 MG Tabs tablet Commonly known as: ELIQUIS Take 1 tablet (5 mg total) by mouth 2 (two) times daily. What changed: See the new instructions.   metoprolol tartrate 25 MG tablet Commonly known as: LOPRESSOR Take 1 tablet (25 mg total) by mouth 2 (two) times daily.        Follow-up Information     [facility] Follow up on 11/08/2021.   Why: -Nothing to eat after midnight. -Present to [facility] on Tues 2/21 @ 6:30 AM for 7:30 AM TEE and cardioversion. -Take your metoprolol and eliquis as Rx. Contact information: [place] [id number] [address] [place]        Minna Merritts, MD. Schedule an appointment as soon as possible for a visit in 1 week(s).   Specialty: Cardiology Why: [facility] Discharge F/UP Contact information: [place] [address] [phone]         Constance Haw, MD .   Specialty: Cardiology Why: We will arrange for follow-up and contact you. Contact information: [address] [place] [address] [phone]                 Discharge Exam: [place] Danker Weights   11/03/21 1402  Weight: 75.1 kg   Eyes: PERRL, lids and conjunctivae normal ENMT: Mucous membranes are moist.  Posterior pharynx clear of any exudate or lesions.Normal dentition.  Neck: normal, supple, no masses, no thyromegaly Respiratory: clear to auscultation bilaterally, no wheezing, no crackles. Normal respiratory effort. No accessory muscle use.  Cardiovascular: Irregular heart rate, tachycardia, no murmurs / rubs / gallops. No extremity edema. 2+ pedal pulses. No carotid bruits.  Abdomen: no tenderness, no masses palpated. No hepatosplenomegaly. Bowel sounds positive.  Musculoskeletal: no clubbing / cyanosis. No joint deformity upper and lower extremities. Good ROM, no contractures. Normal muscle tone.  Skin: no  rashes, lesions, ulcers. No induration Neurologic: CN 2-12 grossly intact. Sensation intact, DTR normal. Strength 5/5 in all 4.  Psychiatric: Normal judgment and insight. Alert and oriented x 3. Normal mood.     Condition at discharge: good  The results of significant diagnostics from this hospitalization (including imaging, microbiology, ancillary and laboratory) are listed below for reference.   Imaging Studies: DG Chest Port 1 View  Result Date: 11/03/2021 CLINICAL DATA:  Atrial fibrillation.  Near-syncope. EXAM: PORTABLE CHEST 1 VIEW COMPARISON:  Chest x-ray dated July 30, 2019. FINDINGS: Stable cardiomediastinal silhouette with pulmonary artery enlargement. Normal pulmonary vascularity. No focal consolidation, pleural effusion, or pneumothorax. No acute osseous abnormality. IMPRESSION: 1. No active disease. Electronically Signed   By: Titus Dubin M.D.   On: 11/03/2021 13:24    Microbiology: Results for orders placed or performed during the hospital encounter of 11/03/21  Resp Panel by RT-PCR (Flu A&B, Covid) Nasopharyngeal Swab     Status: None   Collection Time: 11/03/21  2:03 PM   Specimen: Nasopharyngeal Swab; Nasopharyngeal(NP) swabs in vial transport medium  Result Value Ref Range Status   SARS Coronavirus 2 by RT PCR NEGATIVE NEGATIVE Final    Comment: (NOTE) SARS-CoV-2 target nucleic acids are NOT DETECTED.  The SARS-CoV-2 RNA is generally detectable in upper respiratory specimens during the acute phase of infection. The lowest concentration of SARS-CoV-2 viral copies this assay can detect is 138 copies/mL. A negative result does not preclude SARS-Cov-2 infection and should not be used as the sole basis for treatment or other patient management decisions. A negative result may occur with  improper specimen collection/handling, submission of specimen other than nasopharyngeal swab, presence of viral mutation(s) within the areas targeted by this assay, and inadequate  number of viral copies(<138 copies/mL). A negative result must be combined with clinical observations, patient history, and epidemiological information. The expected result is Negative.  Fact Sheet for Patients:  EntrepreneurPulse.com.au  Fact Sheet for Healthcare Providers:  IncredibleEmployment.be  This test is no t yet approved or cleared by the [place] FDA and  has been authorized for detection and/or diagnosis of SARS-CoV-2 by FDA under an Emergency Use Authorization (EUA). This EUA will remain  in effect (meaning this test can be used) for the duration of the COVID-19 declaration under Section 564(b)(1) of the Act, 21 U.S.C.section 360bbb-3(b)(1), unless the authorization is terminated  or revoked sooner.       Influenza A by PCR NEGATIVE NEGATIVE Final   Influenza B by PCR NEGATIVE NEGATIVE Final    Comment: (NOTE) The Xpert Xpress SARS-CoV-2/FLU/RSV plus assay is intended as an aid in the diagnosis of influenza from Nasopharyngeal swab specimens and should not be used as a sole basis for treatment. Nasal washings and aspirates are unacceptable for Xpert Xpress SARS-CoV-2/FLU/RSV testing.  Fact Sheet for Patients: EntrepreneurPulse.com.au  Fact Sheet for Healthcare Providers: IncredibleEmployment.be  This test is not yet approved or cleared by the [place] FDA and has been  authorized for detection and/or diagnosis of SARS-CoV-2 by FDA under an Emergency Use Authorization (EUA). This EUA will remain in effect (meaning this test can be used) for the duration of the COVID-19 declaration under Section 564(b)(1) of the Act, 21 U.S.C. section 360bbb-3(b)(1), unless the authorization is terminated or revoked.  Performed at [facility], [address] [place], [place] [address]     Labs: CBC: Recent Labs  Lab 11/03/21 1255 11/04/21 0357  WBC 10.9* 5.6  HGB 18.1* 15.1*   HCT 52.1* 44.4  MCV 101.2* 101.6*  PLT 309 638   Basic Metabolic Panel: Recent Labs  Lab 11/03/21 1255  NA 139  K 3.2*  CL 102  CO2 28  GLUCOSE 129*  BUN 21*  CREATININE 0.83  CALCIUM 9.3  MG 2.0  PHOS 3.7   Liver Function Tests: No results for input(s): AST, ALT, ALKPHOS, BILITOT, PROT, ALBUMIN in the last 168 hours. CBG: No results for input(s): GLUCAP in the last 168 hours.  Discharge time spent: greater than 30 minutes.  Signed: Max Sane, MD [facility] 11/05/2021

## 2021-11-08 ENCOUNTER — Other Ambulatory Visit: Payer: Self-pay | Admitting: Cardiovascular Disease

## 2021-11-08 ENCOUNTER — Ambulatory Visit (HOSPITAL_COMMUNITY)
Admission: RE | Admit: 2021-11-08 | Discharge: 2021-11-08 | Disposition: A | Discharge status: Home / Self Care | Payer: Commercial Managed Care - HMO | Source: Ambulatory Visit | Attending: Nurse Practitioner | Admitting: Nurse Practitioner

## 2021-11-08 ENCOUNTER — Ambulatory Visit: Payer: Commercial Managed Care - HMO | Admitting: Anesthesiology

## 2021-11-08 ENCOUNTER — Encounter: Payer: Self-pay | Admitting: Cardiovascular Disease

## 2021-11-08 ENCOUNTER — Ambulatory Visit
Admission: RE | Admit: 2021-11-08 | Discharge: 2021-11-08 | Disposition: A | Discharge status: Home / Self Care | Payer: Commercial Managed Care - HMO | Source: Ambulatory Visit | Attending: Cardiovascular Disease | Admitting: Cardiovascular Disease

## 2021-11-08 ENCOUNTER — Encounter
Admission: RE | Disposition: A | Discharge status: Home / Self Care | Payer: Self-pay | Source: Ambulatory Visit | Attending: Cardiovascular Disease

## 2021-11-08 DIAGNOSIS — I5032 Chronic diastolic (congestive) heart failure: Secondary | ICD-10-CM | POA: Insufficient documentation

## 2021-11-08 DIAGNOSIS — F17210 Nicotine dependence, cigarettes, uncomplicated: Secondary | ICD-10-CM | POA: Diagnosis not present

## 2021-11-08 DIAGNOSIS — Z9884 Bariatric surgery status: Secondary | ICD-10-CM | POA: Diagnosis not present

## 2021-11-08 DIAGNOSIS — I422 Other hypertrophic cardiomyopathy: Secondary | ICD-10-CM | POA: Insufficient documentation

## 2021-11-08 DIAGNOSIS — I483 Typical atrial flutter: Secondary | ICD-10-CM

## 2021-11-08 DIAGNOSIS — E876 Hypokalemia: Secondary | ICD-10-CM | POA: Diagnosis not present

## 2021-11-08 DIAGNOSIS — Z7901 Long term (current) use of anticoagulants: Secondary | ICD-10-CM | POA: Diagnosis not present

## 2021-11-08 DIAGNOSIS — I4819 Other persistent atrial fibrillation: Secondary | ICD-10-CM | POA: Diagnosis present

## 2021-11-08 DIAGNOSIS — I361 Nonrheumatic tricuspid (valve) insufficiency: Secondary | ICD-10-CM

## 2021-11-08 DIAGNOSIS — E669 Obesity, unspecified: Secondary | ICD-10-CM | POA: Diagnosis not present

## 2021-11-08 DIAGNOSIS — I2510 Atherosclerotic heart disease of native coronary artery without angina pectoris: Secondary | ICD-10-CM | POA: Insufficient documentation

## 2021-11-08 HISTORY — PX: CARDIOVERSION: SHX1299

## 2021-11-08 HISTORY — PX: TEE WITHOUT CARDIOVERSION: SHX5443

## 2021-11-08 SURGERY — CARDIOVERSION
Anesthesia: General

## 2021-11-08 SURGERY — ECHOCARDIOGRAM, TRANSESOPHAGEAL
Anesthesia: General

## 2021-11-08 MED ORDER — SODIUM CHLORIDE 0.9 % IV SOLN: INTRAVENOUS | Status: DC

## 2021-11-08 MED ORDER — PROPOFOL 10 MG/ML IV BOLUS
INTRAVENOUS | Status: AC
  Filled 2021-11-08: qty 20

## 2021-11-08 MED ORDER — FLECAINIDE ACETATE 100 MG PO TABS: 100.0000 mg | ORAL_TABLET | Freq: Two times a day (BID) | ORAL | 1 refills | Status: DC

## 2021-11-08 MED ORDER — MIDAZOLAM HCL 2 MG/2ML IJ SOLN
INTRAMUSCULAR | Status: AC
  Filled 2021-11-08: qty 2

## 2021-11-08 MED ORDER — MIDAZOLAM HCL 2 MG/2ML IJ SOLN
INTRAMUSCULAR | Status: DC | PRN
Start: 2021-11-08 — End: 2021-11-08
  Administered 2021-11-08 (×2): 1 mg via INTRAVENOUS

## 2021-11-08 MED ORDER — LIDOCAINE VISCOUS HCL 2 % MT SOLN
OROMUCOSAL | Status: AC
  Filled 2021-11-08: qty 15

## 2021-11-08 MED ORDER — PROPOFOL 10 MG/ML IV BOLUS
INTRAVENOUS | Status: DC | PRN
  Administered 2021-11-08: 10 mg via INTRAVENOUS
  Administered 2021-11-08 (×2): 30 mg via INTRAVENOUS

## 2021-11-08 MED ORDER — SODIUM CHLORIDE FLUSH 0.9 % IV SOLN
INTRAVENOUS | Status: AC
  Filled 2021-11-08: qty 10

## 2021-11-08 MED ORDER — BUTAMBEN-TETRACAINE-BENZOCAINE 2-2-14 % EX AERO
INHALATION_SPRAY | CUTANEOUS | Status: AC
  Filled 2021-11-08: qty 5

## 2021-11-08 NOTE — Transfer of Care (Signed)
Immediate Anesthesia Transfer of Care Note  Patient: CHRYSTINE FROGGE  Procedure(s) Performed: TRANSESOPHAGEAL ECHOCARDIOGRAM (TEE) CARDIOVERSION  Patient Location: PACU  Anesthesia Type:General  Level of Consciousness: awake, alert  and oriented  Airway & Oxygen Therapy: Patient Spontanous Breathing and Patient connected to nasal cannula oxygen  Post-op Assessment: Report given to RN and Post -op Vital signs reviewed and stable  Post vital signs: Reviewed and stable  Last Vitals:  Vitals Value Taken Time  BP 90/63 11/08/21 0805  Temp    Pulse 74 11/08/21 0805  Resp    SpO2 100 % 11/08/21 0805    Last Pain:  Vitals:   11/08/21 0704  TempSrc: Oral  PainSc: 0-No pain         Complications: No notable events documented.

## 2021-11-08 NOTE — Anesthesia Preprocedure Evaluation (Addendum)
Anesthesia Evaluation   Patient awake    Reviewed: Allergy & Precautions, NPO status , Patient's Chart, lab work & pertinent test results, reviewed documented beta blocker date and time   Airway Mallampati: II  TM Distance: >3 FB Neck ROM: Full    Dental  (+) Poor Dentition, Dental Advisory Given, Missing,    Pulmonary Current Smoker and Patient abstained from smoking.,    Pulmonary exam normal breath sounds clear to auscultation       Cardiovascular +CHF (chronic HFpEF)  + dysrhythmias (on eliquis) Atrial Fibrillation  Rhythm:Irregular Rate:Tachycardia  07/2020 TEE: Mild-mod MR, mod TR.  Afib- a. Dx 07/2019-->CHA2DS2VASc = 2 (diast CHF/Fem)-->Eliquis; b. 09/2018 s/p DCCV (150J (biphasic) x 1); c. 12/2019 RFCA/PVI; d. 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   Endo/Other  negative endocrine ROS  Renal/GU negative Renal ROS  negative genitourinary   Musculoskeletal negative musculoskeletal ROS (+)   Abdominal Normal abdominal exam  (+)   Peds  Hematology negative hematology ROS (+)   Anesthesia Other Findings   Reproductive/Obstetrics                           Anesthesia Physical  Anesthesia Plan  ASA: III  Anesthesia Plan: General   Post-op Pain Management:    Induction: Intravenous  PONV Risk Score and Plan: 2 and Propofol infusion and Treatment may vary due to age or medical condition  Airway Management Planned: Natural Airway  Additional Equipment:   Intra-op Plan:   Post-operative Plan:   Informed Consent: I have reviewed the patients History and Physical, chart, labs and discussed the procedure including the risks, benefits and alternatives for the proposed anesthesia with the patient or authorized representative who has indicated  his/her understanding and acceptance.     Dental advisory given  Plan Discussed with: CRNA  Anesthesia Plan Comments:         Anesthesia Quick Evaluation

## 2021-11-08 NOTE — Progress Notes (Signed)
Transesophageal Echocardiogram :  Indication: Atrial flutter with rapid ventricular rate Requesting/ordering  physician: Fletcher Anon  Procedure: Benzocaine spray x2 and 2 mls x 2 of viscous lidocaine were given orally to provide local anesthesia to the oropharynx. The patient was positioned supine on the left side, bite block provided. The patient was moderately sedated with the doses of versed and fentanyl as detailed below.  Using digital technique an omniplane probe was advanced into the distal esophagus without incident.   Moderate sedation: 1. Sedation used: Propofol given by anesthesia   See report in EPIC  for complete details: In brief, transgastric imaging revealed normal LV function with no RWMAs and no mural apical thrombus.  .  Estimated ejection fraction was 50% accurate estimation of LV function challenging in the setting of near cavity obliteration, tachycardia rate 140 bpm . right sided cardiac chambers were normal with no evidence of pulmonary hypertension.  Imaging of the septum showed no ASD or VSD Bubble study was negative for shunt 2D and color flow confirmed no PFO  Left atrium severely dilated The LA was well visualized in orthogonal views.  There was no spontaneous contrast and no thrombus in the LA and LA appendage   Mild to moderate tricuspid valve regurgitation right ventricular systolic pressure estimated 35 to 40 mmHg  The descending thoracic aorta had no  mural aortic debris with no evidence of aneurysmal dilation or disection.  Mild aortic atherosclerosis  Cardioversion to follow   Hannah Zimmerman 11/08/2021 8:18 AM

## 2021-11-08 NOTE — Anesthesia Procedure Notes (Signed)
Date/Time: 11/08/2021 7:34 AM Performed by: Johnna Acosta, CRNA Pre-anesthesia Checklist: Patient identified, Emergency Drugs available, Suction available, Patient being monitored and Timeout performed Patient Re-evaluated:Patient Re-evaluated prior to induction Oxygen Delivery Method: Nasal cannula Preoxygenation: Pre-oxygenation with 100% oxygen Induction Type: IV induction

## 2021-11-08 NOTE — CV Procedure (Addendum)
Cardioversion procedure note For atrial flutter with rapid ventricular rate  Procedure Details:  Consent: Risks of procedure as well as the alternatives and risks of each were explained to the (patient/caregiver).  Consent for procedure obtained.  Time Out: Verified patient identification, verified procedure, site/side was marked, verified correct patient position, special equipment/implants available, medications/allergies/relevent history reviewed, required imaging and test results available.  Performed  Patient placed on cardiac monitor, pulse oximetry, supplemental oxygen as necessary.   Sedation given: propofol IV, Dr. Wynetta Emery Pacer pads placed anterior and posterior chest.   Cardioverted 1 time(s).   Cardioverted at 120 J. Synchronized biphasic Converted to NSR   Evaluation: Findings: Post procedure EKG shows: NSR Complications: None Patient did tolerate procedure well.  Discussed various medication options with Ms. Schurman in effort to maintain normal sinus rhythm She prefers to restart flecainide with her metoprolol Prescription sent in for flecainide 100 twice daily continue metoprolol tartrate 25 twice daily and Eliquis 5 twice daily We will arrange close follow-up in clinic We will send a note to EP  Time Spent Directly with the Patient:  45 minutes   Esmond Plants, M.D., Ph.D.

## 2021-11-08 NOTE — Anesthesia Postprocedure Evaluation (Signed)
Anesthesia Post Note  Patient: Hannah Zimmerman  Procedure(s) Performed: TRANSESOPHAGEAL ECHOCARDIOGRAM (TEE) CARDIOVERSION  Patient location during evaluation: Specials Recovery Anesthesia Type: General Level of consciousness: awake and alert Pain management: pain level controlled Vital Signs Assessment: post-procedure vital signs reviewed and stable Respiratory status: spontaneous breathing, nonlabored ventilation and respiratory function stable Cardiovascular status: blood pressure returned to baseline and stable Postop Assessment: no apparent nausea or vomiting Anesthetic complications: no   No notable events documented.   Last Vitals:  Vitals:   11/08/21 0815 11/08/21 0845  BP:  91/72  Pulse: 65   Resp: 11   Temp:    SpO2: 100%     Last Pain:  Vitals:   11/08/21 0845  TempSrc:   PainSc: 0-No pain                 Iran Ouch

## 2021-11-08 NOTE — Progress Notes (Signed)
*  PRELIMINARY RESULTS* Echocardiogram 2D Echocardiogram has been performed.  Sherrie Sport 11/08/2021, 8:06 AM

## 2021-11-09 ENCOUNTER — Encounter: Payer: Self-pay | Admitting: Cardiovascular Disease

## 2021-11-10 ENCOUNTER — Ambulatory Visit (INDEPENDENT_AMBULATORY_CARE_PROVIDER_SITE_OTHER): Payer: Managed Care, Other (non HMO) | Admitting: Cardiology

## 2021-11-10 ENCOUNTER — Encounter: Payer: Self-pay | Admitting: Cardiology

## 2021-11-10 ENCOUNTER — Other Ambulatory Visit: Payer: Self-pay

## 2021-11-10 VITALS — BP 106/78 | HR 51 | Ht 64.0 in | Wt 169.8 lb

## 2021-11-10 DIAGNOSIS — I4819 Other persistent atrial fibrillation: Secondary | ICD-10-CM | POA: Diagnosis not present

## 2021-11-10 MED ORDER — PROPAFENONE HCL ER 325 MG PO CP12: 325.0000 mg | ORAL_CAPSULE | Freq: Two times a day (BID) | ORAL | 3 refills | Status: DC

## 2021-11-10 NOTE — Patient Instructions (Addendum)
Medication Instructions:  Your physician has recommended you make the following change in your medication:  STOP Flecainide START Propafenone 325 mg twice daily  *If you need a refill on your cardiac medications before your next appointment, please call your pharmacy*   Lab Work: None ordered   Testing/Procedures: None ordered   Follow-Up: At [facility], you and your health needs are our priority.  As part of our continuing mission to provide you with exceptional heart care, we have created designated Provider Care Teams.  These Care Teams include your primary Cardiologist (physician) and Advanced Practice Providers (APPs -  Physician Assistants and Nurse Practitioners) who all work together to provide you with the care you need, when you need it.  Your next appointment:   3 month(s)  The format for your next appointment:   In Person  Provider:   Tommye Standard,  [place] or Oda Kilts, [place]    Thank you for choosing [facility]!!   Trinidad Curet, RN [phone]   Other Instructions  Propafenone Tablets What is this medication? PROPAFENONE (proe pa FEEN one) prevents and treats a fast or irregular heartbeat (arrhythmia). It is often used to treat a type of arrhythmia known as AFib (atrial fibrillation). It works by slowing down overactive electric signals in the heart, which stabilizes your heart rhythm. It belongs to a group of medications called antiarrhythmics. This medicine may be used for other purposes; ask your health care provider or pharmacist if you have questions. COMMON BRAND NAME(S): Rythmol What should I tell my care team before I take this medication? They need to know if you have any of these conditions: Heart disease High potassium level Kidney disease Liver disease Low blood pressure Lung or breathing disease like asthma, chronic bronchitis, or emphysema Pacemaker Slow heart rate An unusual or allergic reaction to propafenone, other  medications, foods, dyes, or preservatives Pregnant or trying to get pregnant Breast-feeding How should I use this medication? Take this medication by mouth with a glass of water. Follow the directions on the prescription label. You can take this medication with or without food. Take your doses at regular intervals. Do not take your medication more often than directed. Do not stop taking except on the advice of your care team. Talk to your care team regarding the use of this medication in children. Special care may be needed. Overdosage: If you think you have taken too much of this medicine contact a poison control center or emergency room at once. NOTE: This medicine is only for you. Do not share this medicine with others. What if I miss a dose? If you miss a dose, take it as soon as you can. If it is almost time for your next dose, take only that dose. Do not take double or extra doses. What may interact with this medication? Do not take this medication with any of the following: Arsenic trioxide Certain antibiotics like clarithromycin, erythromycin, grepafloxacin, pentamidine, sparfloxacin, troleandomycin Certain medications for depression or mental illness like amoxapine, haloperidol, maprotiline, pimozide, sertindole, thioridazine, tricyclic antidepressants Certain medications for fungal infections like fluconazole, itraconazole, ketoconazole, posaconazole, voriconazole Certain medications for irregular heartbeat like dronedarone Certain medications for malaria like chloroquine, halofantrine Cisapride Droperidol Levomethadyl Ranolazine This medication may also interact with the following: Certain medications for angina or blood pressure Certain medications for asthma or breathing difficulties like formoterol, salmeterol Certain medications that treat or prevent blood clots like warfarin Cimetidine Cyclosporine Digoxin Diuretics Local anesthetics Other medications that prolong the  QT interval (cause an abnormal heart rhythm) like dofetilide, ziprasidone Rifampin Ritonavir Theophylline This list may not describe all possible interactions. Give your health care provider a list of all the medicines, herbs, non-prescription drugs, or dietary supplements you use. Also tell them if you smoke, drink alcohol, or use illegal drugs. Some items may interact with your medicine. What should I watch for while using this medication? Your condition will be monitored closely when you first begin therapy. Often, this medication is first started in a hospital or other monitored health care setting. Once you are on maintenance therapy, visit your care team for regular checks on your progress. Because your condition and use of this medication carry some risk, it is a good idea to carry an identification card, necklace or bracelet with details of your condition, medications, and care team. You may get drowsy or dizzy. Do not drive, use machinery, or do anything that needs mental alertness until you know how this medication affects you. Do not stand or sit up quickly, especially if you are an older patient. This reduces the risk of dizzy or fainting spells. If you are going to have surgery, tell your care team that you are taking this medication. What side effects may I notice from receiving this medication? Side effects that you should report to your care team as soon as possible: Allergic reactions--skin rash, itching, hives, swelling of the face, lips, tongue, or throat Heart failure--shortness of breath, swelling of the ankles, feet, or hands, sudden weight gain, unusual weakness or fatigue Heart rhythm changes--fast or irregular heartbeat, dizziness, feeling faint or lightheaded, chest pain, trouble breathing Infection--fever, chills, cough, sore throat Unusual bruising or bleeding Side effects that usually do not require medical attention (report to your care team if they continue or are  bothersome): Change in taste Constipation Dizziness Fatigue Nausea This list may not describe all possible side effects. Call your doctor for medical advice about side effects. You may report side effects to FDA at 1-800-FDA-1088. Where should I keep my medication? Keep out of the reach of children and pets. Store at room temperature between 15 and 30 degrees C (59 and 86 degrees F). Protect from light. Keep container tightly closed. Throw away any unused medication after the expiration date. NOTE: This sheet is a summary. It may not cover all possible information. If you have questions about this medicine, talk to your doctor, pharmacist, or health care provider.  2022 Elsevier/[employer] (2021-01-12 00:00:00)

## 2021-11-10 NOTE — Progress Notes (Signed)
Electrophysiology Office Note   Date:  11/10/2021   ID:  [name], DOB 12-Aug-1971, MRN [id number]  PCP:  Patient, No Pcp Per (Inactive)  Cardiologist:  Rockey Situ Primary Electrophysiologist:  Robben Jagiello Meredith Leeds, MD    Chief Complaint: AF   History of Present Illness: Hannah Zimmerman is a 51 y.o. female who is being seen today for the evaluation of AF at the request of No ref. provider found. Presenting today for electrophysiology evaluation.  He has a history significant for heart failure with preserved ejection fraction, morbid obesity status post gastric bypass, persistent atrial fibrillation, and tobacco abuse.  She is status post atrial fibrillation ablation on 12/24/2019 with repeat ablation 08/11/2020.  She had a cardiac MRI which confirmed hypertrophic cardiomyopathy with greater than 15% LGE.  She represented to [facility] with atrial fibrillation.  She states that she has started to go through menopause and has been having hormone swings which she feels is what started her atrial fibrillation.  She is now status post TEE and cardioversion.  She was started on flecainide, Eliquis, metoprolol.  Since starting these medications, she has been having headaches.  Today, denies symptoms of palpitations, chest pain, shortness of breath, orthopnea, PND, lower extremity edema, claudication, dizziness, presyncope, syncope, bleeding, or neurologic sequela. The patient is tolerating medications without difficulties.     Past Medical History:  Diagnosis Date   (HFpEF) heart failure with preserved ejection fraction ([place])    a. 07/2019 Echo: EF 60-65%, mod LVH. Sev dil LA, mildly dil RA. Mod elev PASP; b. 07/2020 TEE: EF 50-55%, no rwma, sev conc LVH. Nl RV fxn. Mod dil RA. Mild-mod MR, mod TR.   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 07/2020 Cardiac CT: Cor Ca2+ = 338 (99th %'ile).   Complication of anesthesia 20 years ago   a. spinal with first c  section went too high stopped breathing, low bp with 2nd c section, no further issues with anesthesia   Heart murmur    a. 07/2019 Echo: no significant valvular dzs; b. 07/2020 TEE: Mild-mod MR, mod TR.   Hypertrophic cardiomyopathy ([place])    a. 07/2019 Echo: Mod LVH; b. 01/2020 cMRI: EF 67%, HCM w/o obstruction. Max wall thickness 30mm. Sev LAE w/ L->R atrial level shunt. Large area of LGE @ mid-ventricular level in area of max wall thickness; c. 07/2020 Cardiac CT: Asymm hypertrophy up to 32mm in mid inferoseptum consistent w/ known HCM.   Morbid obesity ([place])    a. 09/2014 s/p gastric bypass.   Persistent atrial fibrillation ([place])    a. Dx 07/2019-->CHA2DS2VASc = 2 (diast CHF/Fem)-->Eliquis; b. 09/2018 s/p DCCV (150J (biphasic) x 1); c. 12/2019 RFCA/PVI; d. 07/2020 repeat RFCA/PVI.   Rash    both arms from old bed bug bites, healing   Tobacco abuse    Typical atrial flutter ([place])    a. 12/2019 s/p RFCA.   Past Surgical History:  Procedure Laterality Date   ABDOMINAL HYSTERECTOMY     ANKLE SURGERY     left x 2   ATRIAL FIBRILLATION ABLATION N/A 12/24/2019   Procedure: ATRIAL FIBRILLATION ABLATION;  Surgeon: Constance Haw, MD;  Location: [place] [facility];  Service: Cardiovascular;  Laterality: N/A;   ATRIAL FIBRILLATION ABLATION N/A 08/11/2020   Procedure: ATRIAL FIBRILLATION ABLATION;  Surgeon: Constance Haw, MD;  Location: [place] [facility];  Service: Cardiovascular;  Laterality: N/A;   CARDIOVERSION N/A 09/24/2019   Procedure: CARDIOVERSION;  Surgeon: Minna Merritts, MD;  Location: [facility];  Service: Cardiovascular;  Laterality: N/A;   CARDIOVERSION N/A 10/17/2019   Procedure: CARDIOVERSION;  Surgeon: Minna Merritts, MD;  Location: [place] [facility];  Service: Cardiovascular;  Laterality: N/A;   CARDIOVERSION N/A 11/08/2021   Procedure: CARDIOVERSION;  Surgeon: Minna Merritts, MD;  Location: [facility];  Service: Cardiovascular;  Laterality: N/A;   CESAREAN SECTION     x 3    LAPAROSCOPIC GASTRIC SLEEVE RESECTION WITH HIATAL HERNIA REPAIR  08/17/2015   Procedure: LAPAROSCOPIC GASTRIC SLEEVE RESECTION WITH HIATAL HERNIA REPAIR;  Surgeon: Johnathan Hausen, MD;  Location: [facility];  Service: General;;   TEE WITHOUT CARDIOVERSION N/A 08/11/2020   Procedure: TRANSESOPHAGEAL ECHOCARDIOGRAM (TEE);  Surgeon: Constance Haw, MD;  Location: [place] [facility];  Service: Cardiovascular;  Laterality: N/A;   TEE WITHOUT CARDIOVERSION N/A 11/08/2021   Procedure: TRANSESOPHAGEAL ECHOCARDIOGRAM (TEE);  Surgeon: Minna Merritts, MD;  Location: [facility];  Service: Cardiovascular;  Laterality: N/A;   TUBAL LIGATION     x 2   UPPER GI ENDOSCOPY  08/17/2015   Procedure: UPPER GI ENDOSCOPY;  Surgeon: Johnathan Hausen, MD;  Location: [facility];  Service: General;;     Current Outpatient Medications  Medication Sig Dispense Refill   acetaminophen (TYLENOL) 500 MG tablet Take 1,000 mg by mouth every 6 (six) hours as needed (pain/headaches.).     apixaban (ELIQUIS) 5 MG TABS tablet Take 1 tablet (5 mg total) by mouth 2 (two) times daily. 60 tablet 0   Cholecalciferol (VITAMIN D3 PO) Take 1 tablet by mouth in the morning.     Cyanocobalamin (VITAMIN B-12 PO) Take 1 tablet by mouth in the morning.     metoprolol tartrate (LOPRESSOR) 25 MG tablet Take 1 tablet (25 mg total) by mouth 2 (two) times daily. 60 tablet 0   propafenone (RYTHMOL SR) 325 MG 12 hr capsule Take 1 capsule (325 mg total) by mouth 2 (two) times daily. 60 capsule 3   No current facility-administered medications for this visit.    Allergies:   Codeine and Hydrocodone   Social History:  The patient  reports that she has been smoking cigarettes. She has a 15.50 pack-year smoking history. She has been exposed to tobacco smoke. She has never used smokeless tobacco. She reports current alcohol use of about 1.0 standard drink per week. She reports that she does not use drugs.   Family History:  The patient's family history  includes Diabetes in her mother; Heart failure in her mother; Hypertension in her mother.   ROS:  Please see the history of present illness.   Otherwise, review of systems is positive for none.   All other systems are reviewed and negative.   PHYSICAL EXAM: VS:  BP 106/78    Pulse (!) 51    Ht 5\' 4"  (1.626 m)    Wt 169 lb 12.8 oz (77 kg)    SpO2 96%    BMI 29.15 kg/m  , BMI Body mass index is 29.15 kg/m. GEN: Well nourished, well developed, in no acute distress  HEENT: normal  Neck: no JVD, carotid bruits, or masses Cardiac: RRR; no murmurs, rubs, or gallops,no edema  Respiratory:  clear to auscultation bilaterally, normal work of breathing GI: soft, nontender, nondistended, + BS MS: no deformity or atrophy  Skin: warm and dry Neuro:  Strength and sensation are intact Psych: euthymic mood, full affect  EKG:  EKG is ordered today. Personal review of the ekg  ordered shows sinus rhythm, rate 51  Recent Labs: 11/03/2021: BUN 21; Creatinine, Ser 0.83; Magnesium 2.0; Potassium 3.2; Sodium 139 11/04/2021: Hemoglobin 15.1; Platelets 202    Lipid Panel     Component Value Date/Time   CHOL 121 07/31/2019 0559   TRIG 68 07/31/2019 0559   HDL 35 (L) 07/31/2019 0559   CHOLHDL 3.5 07/31/2019 0559   VLDL 14 07/31/2019 0559   LDLCALC 72 07/31/2019 0559     Wt Readings from Last 3 Encounters:  11/10/21 169 lb 12.8 oz (77 kg)  11/08/21 161 lb (73 kg)  11/03/21 165 lb 8 oz (75.1 kg)      Other studies Reviewed: Additional studies/ records that were reviewed today include: Cardiac MRI 01/28/2020 Review of the above records today demonstrates:  1.  Normal LV chamber size, with hyperdynamic function, LVEF 67%.   2.  Normal RV chamber size, RVEF 70%.   3. 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 Maximal wall thickness at the mid ventricular level is 19 mm.   4. Large area of dense delayed myocardial enhancement at the mid ventricular  level in the area of maximal wall thickness. Visually appears > 15% of the myocardial mass.   5. Severe left atrial enlargement. Iatrogenic left to right atrial level shunt.   6.  No findings to suggests cardiac amyloidosis.  ECV 22%.  TEE 11/08/21   1. Left ventricular ejection fraction, by estimation, is 50-55 %. The  left ventricle has normal function. The left ventricle has no regional  wall motion abnormalities. There is mild concentric left ventricular  hypertrophy.   2. Right ventricular systolic function is normal. The right ventricular  size is normal.   3. Left atrial size was severely dilated. No left atrial/left atrial  appendage thrombus was detected.   4. Right atrial size was moderately dilated.   5. The mitral valve is normal in structure. No evidence of mitral valve  regurgitation. No evidence of mitral stenosis.   6. Tricuspid valve regurgitation is moderate.   7. The aortic valve is normal in structure. Aortic valve regurgitation is  not visualized. Aortic valve sclerosis is present, with no evidence of  aortic valve stenosis.   8. The inferior vena cava is normal in size with greater than 50%  respiratory variability, suggesting right atrial pressure of 3 mmHg.   9. Agitated saline contrast bubble study was negative, with no evidence  of any interatrial shunt.   ASSESSMENT AND PLAN:  1..  Persistent atrial fibrillation/flutter: Currently on Eliquis.  CHA2DS2-VASc of 1.  She is also on 100 mg of flecainide and 25 mg of metoprolol.  I risk medication monitoring for flecainide via ECG 2.  She remains in sinus rhythm after her cardioversion.  She is having headaches which can occur with flecainide.  We Arwin Bisceglia stop flecainide and start propafenone 325 mg twice daily.  2.  Tobacco abuse: Smoking cessation encouraged  3.  Morbid obesity: Status post gastric bypass.  Has lost up to 200 pounds.  4.  Hypertrophic cardiomyopathy: Found on MRI.  Has LGE.  Continue  management per primary cardiology   Current medicines are reviewed at length with the patient today.   The patient does not have concerns regarding her medicines.  The following changes were made today: Flecainide, start propafenone  Labs/ tests ordered today include:  Orders Placed This Encounter  Procedures   EKG 12-Lead     Disposition:   FU with Courteny Egler 3 months  Signed, Anjeli Casad Meredith Leeds, MD  11/10/2021 4:43 PM     [address] [place] [address] [phone] (office) [phone] (fax)

## 2021-11-12 ENCOUNTER — Emergency Department (HOSPITAL_COMMUNITY): Payer: Commercial Managed Care - HMO | Admitting: Anesthesiology

## 2021-11-12 ENCOUNTER — Inpatient Hospital Stay (HOSPITAL_COMMUNITY)
Admission: EM | Admit: 2021-11-12 | Discharge: 2021-11-15 | DRG: 024 | Disposition: A | Discharge status: Home / Self Care | Payer: Commercial Managed Care - HMO | Source: Other Acute Inpatient Hospital | Attending: Internal Medicine | Admitting: Internal Medicine

## 2021-11-12 ENCOUNTER — Emergency Department: Payer: Commercial Managed Care - HMO

## 2021-11-12 ENCOUNTER — Emergency Department
Admission: EM | Admit: 2021-11-12 | Discharge: 2021-11-12 | Disposition: A | Discharge status: Other Acute Inpatient Hospital | Payer: Commercial Managed Care - HMO | Attending: Emergency Medicine | Admitting: Emergency Medicine

## 2021-11-12 ENCOUNTER — Emergency Department (HOSPITAL_COMMUNITY): Payer: Commercial Managed Care - HMO

## 2021-11-12 ENCOUNTER — Inpatient Hospital Stay: Admit: 2021-11-12 | Payer: Self-pay | Admitting: Interventional Radiology

## 2021-11-12 ENCOUNTER — Encounter (HOSPITAL_COMMUNITY)
Admission: EM | Disposition: A | Discharge status: Home / Self Care | Payer: Self-pay | Source: Home / Self Care | Attending: Neurology

## 2021-11-12 ENCOUNTER — Other Ambulatory Visit: Payer: Self-pay

## 2021-11-12 DIAGNOSIS — I63511 Cerebral infarction due to unspecified occlusion or stenosis of right middle cerebral artery: Secondary | ICD-10-CM

## 2021-11-12 DIAGNOSIS — I6601 Occlusion and stenosis of right middle cerebral artery: Secondary | ICD-10-CM | POA: Diagnosis present

## 2021-11-12 DIAGNOSIS — I639 Cerebral infarction, unspecified: Principal | ICD-10-CM

## 2021-11-12 DIAGNOSIS — Z7901 Long term (current) use of anticoagulants: Secondary | ICD-10-CM | POA: Diagnosis not present

## 2021-11-12 DIAGNOSIS — R29724 NIHSS score 24: Secondary | ICD-10-CM | POA: Diagnosis present

## 2021-11-12 DIAGNOSIS — I483 Typical atrial flutter: Secondary | ICD-10-CM | POA: Diagnosis present

## 2021-11-12 DIAGNOSIS — H518 Other specified disorders of binocular movement: Secondary | ICD-10-CM | POA: Diagnosis present

## 2021-11-12 DIAGNOSIS — Z20822 Contact with and (suspected) exposure to covid-19: Secondary | ICD-10-CM | POA: Diagnosis not present

## 2021-11-12 DIAGNOSIS — F17210 Nicotine dependence, cigarettes, uncomplicated: Secondary | ICD-10-CM | POA: Diagnosis present

## 2021-11-12 DIAGNOSIS — I6523 Occlusion and stenosis of bilateral carotid arteries: Secondary | ICD-10-CM | POA: Diagnosis present

## 2021-11-12 DIAGNOSIS — Z9884 Bariatric surgery status: Secondary | ICD-10-CM

## 2021-11-12 DIAGNOSIS — I110 Hypertensive heart disease with heart failure: Secondary | ICD-10-CM | POA: Diagnosis present

## 2021-11-12 DIAGNOSIS — I959 Hypotension, unspecified: Secondary | ICD-10-CM | POA: Diagnosis not present

## 2021-11-12 DIAGNOSIS — I4891 Unspecified atrial fibrillation: Secondary | ICD-10-CM | POA: Diagnosis not present

## 2021-11-12 DIAGNOSIS — G8194 Hemiplegia, unspecified affecting left nondominant side: Secondary | ICD-10-CM | POA: Diagnosis present

## 2021-11-12 DIAGNOSIS — G460 Middle cerebral artery syndrome: Secondary | ICD-10-CM | POA: Diagnosis present

## 2021-11-12 DIAGNOSIS — Z90710 Acquired absence of both cervix and uterus: Secondary | ICD-10-CM | POA: Diagnosis not present

## 2021-11-12 DIAGNOSIS — I6389 Other cerebral infarction: Secondary | ICD-10-CM | POA: Diagnosis not present

## 2021-11-12 DIAGNOSIS — R29810 Facial weakness: Secondary | ICD-10-CM | POA: Diagnosis present

## 2021-11-12 DIAGNOSIS — Y900 Blood alcohol level of less than 20 mg/100 ml: Secondary | ICD-10-CM | POA: Diagnosis not present

## 2021-11-12 DIAGNOSIS — R414 Neurologic neglect syndrome: Secondary | ICD-10-CM | POA: Diagnosis present

## 2021-11-12 DIAGNOSIS — I4819 Other persistent atrial fibrillation: Secondary | ICD-10-CM | POA: Diagnosis not present

## 2021-11-12 DIAGNOSIS — E785 Hyperlipidemia, unspecified: Secondary | ICD-10-CM | POA: Diagnosis present

## 2021-11-12 DIAGNOSIS — I5032 Chronic diastolic (congestive) heart failure: Secondary | ICD-10-CM | POA: Diagnosis present

## 2021-11-12 DIAGNOSIS — Z79899 Other long term (current) drug therapy: Secondary | ICD-10-CM | POA: Diagnosis not present

## 2021-11-12 DIAGNOSIS — I63231 Cerebral infarction due to unspecified occlusion or stenosis of right carotid arteries: Secondary | ICD-10-CM | POA: Diagnosis not present

## 2021-11-12 DIAGNOSIS — I63411 Cerebral infarction due to embolism of right middle cerebral artery: Secondary | ICD-10-CM | POA: Diagnosis present

## 2021-11-12 DIAGNOSIS — R531 Weakness: Secondary | ICD-10-CM | POA: Diagnosis present

## 2021-11-12 DIAGNOSIS — E876 Hypokalemia: Secondary | ICD-10-CM | POA: Diagnosis present

## 2021-11-12 DIAGNOSIS — I422 Other hypertrophic cardiomyopathy: Secondary | ICD-10-CM | POA: Diagnosis present

## 2021-11-12 DIAGNOSIS — Z6830 Body mass index (BMI) 30.0-30.9, adult: Secondary | ICD-10-CM | POA: Diagnosis not present

## 2021-11-12 DIAGNOSIS — R471 Dysarthria and anarthria: Secondary | ICD-10-CM | POA: Diagnosis present

## 2021-11-12 HISTORY — PX: RADIOLOGY WITH ANESTHESIA: SHX6223

## 2021-11-12 LAB — CBG MONITORING, ED: Glucose-Capillary: 110 mg/dL — ABNORMAL HIGH (ref 70–99)

## 2021-11-12 LAB — COMPREHENSIVE METABOLIC PANEL
ALT: 51 U/L — ABNORMAL HIGH (ref 0–44)
AST: 28 U/L (ref 15–41)
Albumin: 3.6 g/dL (ref 3.5–5.0)
Alkaline Phosphatase: 86 U/L (ref 38–126)
Anion gap: 12 (ref 5–15)
BUN: 9 mg/dL (ref 6–20)
CO2: 26 mmol/L (ref 22–32)
Calcium: 8.9 mg/dL (ref 8.9–10.3)
Chloride: 101 mmol/L (ref 98–111)
Creatinine, Ser: 0.58 mg/dL (ref 0.44–1.00)
GFR, Estimated: >60 mL/min (ref >60)
Glucose, Bld: 118 mg/dL — ABNORMAL HIGH (ref 70–99)
Potassium: 2.7 mmol/L — CL (ref 3.5–5.1)
Sodium: 139 mmol/L (ref 135–145)
Total Bilirubin: 0.7 mg/dL (ref 0.3–1.2)
Total Protein: 6.2 g/dL — ABNORMAL LOW (ref 6.5–8.1)

## 2021-11-12 LAB — PROTIME-INR
INR: 1.1 (ref 0.8–1.2)
Prothrombin Time: 14.2 seconds (ref 11.4–15.2)

## 2021-11-12 LAB — CBC
HCT: 43.6 % (ref 36.0–46.0)
Hemoglobin: 15.3 g/dL — ABNORMAL HIGH (ref 12.0–15.0)
MCH: 34.9 pg — ABNORMAL HIGH (ref 26.0–34.0)
MCHC: 35.1 g/dL (ref 30.0–36.0)
MCV: 99.5 fL (ref 80.0–100.0)
Platelets: 210 10*3/uL (ref 150–400)
RBC: 4.38 MIL/uL (ref 3.87–5.11)
RDW: 13 % (ref 11.5–15.5)
WBC: 6.7 10*3/uL (ref 4.0–10.5)
nRBC: 0 % (ref 0.0–0.2)

## 2021-11-12 LAB — DIFFERENTIAL
Abs Immature Granulocytes: 0.01 10*3/uL (ref 0.00–0.07)
Basophils Absolute: 0.1 10*3/uL (ref 0.0–0.1)
Basophils Relative: 2 %
Eosinophils Absolute: 0.3 10*3/uL (ref 0.0–0.5)
Eosinophils Relative: 4 %
Immature Granulocytes: 0 %
Lymphocytes Relative: 31 %
Lymphs Abs: 2.1 10*3/uL (ref 0.7–4.0)
Monocytes Absolute: 0.7 10*3/uL (ref 0.1–1.0)
Monocytes Relative: 10 %
Neutro Abs: 3.6 10*3/uL (ref 1.7–7.7)
Neutrophils Relative %: 53 %

## 2021-11-12 LAB — RESP PANEL BY RT-PCR (FLU A&B, COVID) ARPGX2
Influenza A by PCR: NEGATIVE
Influenza B by PCR: NEGATIVE
SARS Coronavirus 2 by RT PCR: NEGATIVE

## 2021-11-12 LAB — ETHANOL: Alcohol, Ethyl (B): 12 mg/dL — ABNORMAL HIGH (ref <10)

## 2021-11-12 LAB — APTT: aPTT: 37 seconds — ABNORMAL HIGH (ref 24–36)

## 2021-11-12 SURGERY — IR WITH ANESTHESIA
Anesthesia: General

## 2021-11-12 MED ORDER — IOHEXOL 350 MG/ML SOLN
100.0000 mL | Freq: Once | INTRAVENOUS | Status: AC | PRN
  Administered 2021-11-12: 100 mL via INTRAVENOUS

## 2021-11-12 MED ORDER — PHENYLEPHRINE HCL-NACL 20-0.9 MG/250ML-% IV SOLN
INTRAVENOUS | Status: DC | PRN
  Administered 2021-11-12: 50 ug/min via INTRAVENOUS

## 2021-11-12 MED ORDER — IOHEXOL 300 MG/ML  SOLN
100.0000 mL | Freq: Once | INTRAMUSCULAR | Status: AC | PRN
  Administered 2021-11-12: 50 mL via INTRA_ARTERIAL

## 2021-11-12 MED ORDER — NITROGLYCERIN 1 MG/10 ML FOR IR/CATH LAB
INTRA_ARTERIAL | Status: AC
Start: 2021-11-12 — End: 2021-11-13
  Filled 2021-11-12: qty 10

## 2021-11-12 MED ORDER — SUCCINYLCHOLINE CHLORIDE 200 MG/10ML IV SOSY
PREFILLED_SYRINGE | INTRAVENOUS | Status: DC | PRN
  Administered 2021-11-12: 110 mg via INTRAVENOUS

## 2021-11-12 MED ORDER — ACETAMINOPHEN 325 MG PO TABS: 650.0000 mg | ORAL_TABLET | ORAL | Status: DC | PRN

## 2021-11-12 MED ORDER — SUGAMMADEX SODIUM 200 MG/2ML IV SOLN
INTRAVENOUS | Status: DC | PRN
Start: 2021-11-12 — End: 2021-11-13
  Administered 2021-11-12: 200 mg via INTRAVENOUS

## 2021-11-12 MED ORDER — LIDOCAINE HCL (CARDIAC) PF 50 MG/5ML IV SOSY
PREFILLED_SYRINGE | INTRAVENOUS | Status: DC | PRN
  Administered 2021-11-12: 60 mg via INTRAVENOUS

## 2021-11-12 MED ORDER — SODIUM CHLORIDE 0.9 % IV SOLN: INTRAVENOUS | Status: DC | PRN

## 2021-11-12 MED ORDER — ROCURONIUM BROMIDE 100 MG/10ML IV SOLN
INTRAVENOUS | Status: DC | PRN
  Administered 2021-11-12: 50 mg via INTRAVENOUS

## 2021-11-12 MED ORDER — SODIUM CHLORIDE 0.9 % IV SOLN: INTRAVENOUS | Status: DC

## 2021-11-12 MED ORDER — SENNOSIDES-DOCUSATE SODIUM 8.6-50 MG PO TABS: 1.0000 | ORAL_TABLET | Freq: Every evening | ORAL | Status: DC | PRN

## 2021-11-12 MED ORDER — CEFAZOLIN SODIUM-DEXTROSE 2-3 GM-%(50ML) IV SOLR
INTRAVENOUS | Status: DC | PRN
  Administered 2021-11-12: 2 g via INTRAVENOUS

## 2021-11-12 MED ORDER — NITROGLYCERIN 5 MG/ML IV SOLN
INTRAVENOUS | Status: AC | PRN
  Administered 2021-11-12 (×4): 25 ug via INTRA_ARTERIAL

## 2021-11-12 MED ORDER — FENTANYL CITRATE (PF) 100 MCG/2ML IJ SOLN
INTRAMUSCULAR | Status: AC
  Filled 2021-11-12: qty 2

## 2021-11-12 MED ORDER — PROPOFOL 10 MG/ML IV BOLUS
INTRAVENOUS | Status: DC | PRN
  Administered 2021-11-12: 150 mg via INTRAVENOUS

## 2021-11-12 MED ORDER — ACETAMINOPHEN 650 MG RE SUPP: 650.0000 mg | RECTAL | Status: DC | PRN

## 2021-11-12 MED ORDER — FENTANYL CITRATE (PF) 100 MCG/2ML IJ SOLN
INTRAMUSCULAR | Status: DC | PRN
  Administered 2021-11-12 (×2): 25 ug via INTRAVENOUS

## 2021-11-12 MED ORDER — ACETAMINOPHEN 160 MG/5ML PO SOLN: 650.0000 mg | ORAL | Status: DC | PRN

## 2021-11-12 MED ORDER — CEFAZOLIN SODIUM-DEXTROSE 2-4 GM/100ML-% IV SOLN
INTRAVENOUS | Status: AC
  Filled 2021-11-12: qty 100

## 2021-11-12 MED ORDER — ESMOLOL HCL 100 MG/10ML IV SOLN
INTRAVENOUS | Status: DC | PRN
  Administered 2021-11-12: 30 mg via INTRAVENOUS
  Administered 2021-11-12 – 2021-11-13 (×7): 10 mg via INTRAVENOUS

## 2021-11-12 MED ORDER — POTASSIUM CHLORIDE 10 MEQ/100ML IV SOLN
10.0000 meq | INTRAVENOUS | Status: AC
  Administered 2021-11-13 (×4): 10 meq via INTRAVENOUS
  Filled 2021-11-12: qty 100

## 2021-11-12 MED ORDER — STROKE: EARLY STAGES OF RECOVERY BOOK
Freq: Once | Status: DC
  Filled 2021-11-12: qty 1

## 2021-11-12 MED ORDER — IOHEXOL 300 MG/ML  SOLN
100.0000 mL | Freq: Once | INTRAMUSCULAR | Status: AC | PRN
  Administered 2021-11-12: 20 mL via INTRA_ARTERIAL

## 2021-11-12 NOTE — Progress Notes (Signed)
Telemedicine specialist called regarding this patient.  Dense right MCA sign.  Will need intervention.  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 Informed [facility] to activate IR code stroke as soon as the patient is evaluated by specialist on-call over camera. Also spoke with Dr. Leory Plowman to arrange for transport via [facility] as there is no [facility] truck available. Dr. Estanislado Pandy made aware. Patient will be excepted to my service at [facility] neurological ICU but should go straight to the IR suite first.  -- Amie Portland, MD Neurologist [facility] Pager: [phone]

## 2021-11-12 NOTE — ED Notes (Addendum)
Pts belongings placed in a belongings bag and given to the patients fiance Audelia Acton): Black pants Black shoes 2 pink socks 1 purple bra 1 gray shirt  1 yellow vape

## 2021-11-12 NOTE — ED Notes (Signed)
Pt to ct with tech and rn assist, tele neuro on screen waiting for pt to arrive in room per brittany, stroke rn can assess pt in room.

## 2021-11-12 NOTE — ED Notes (Signed)
This RN accompanied the patient to CT scan with EMS upon arrival. Per EMS, pt had an LVO score of 3. LKW 2020 per patients daughter. Notable left sided weakness, left sided facial droop and mild deviation to the right side. Hx of afib on Eliquis; ETOH consumption tonight; tobacco abuse; no previous hx of stroke. MD Bradler present at the bedside in CT.  NIHS upon arrival of 10

## 2021-11-12 NOTE — ED Triage Notes (Signed)
Per ems pt with left sided weakness onset at 2020. Pt with slurred speech per ems, cbg 110, 18g lac. Pt with slightly slurred speech now, per ems "lvo of three". Pt with history of a fib

## 2021-11-12 NOTE — Procedures (Signed)
Bilateral common carotid arteriograms. RT CFA approach. Findings. 1.Occluded RT ICA and RT MCA.  2.S/P complete revascularization of of RT ICA and Rt MCA with x1 pass with aspiration and x 1 pass with contact aspiration and 43mm x 45 mm embotrap retriever  achieving a TICI 3 revascularization . Post CT Brain No ICH or mass effect. 26F angioseal used for hemostasis at RT CFA puncture site. Distal pulses all dopplerable .  Extubated. Maintaining airway and O2 sats. Lifts RT arm and Rt leg to command. Beginning to move toes of the Lt foot. No move ment noted of the Lt arm. Pupils 70mm Rt = LT  S.Jacy Brocker MD

## 2021-11-12 NOTE — Anesthesia Procedure Notes (Signed)
Arterial Line Insertion Start/End2/25/2023 11:06 PM, 11/12/2021 11:11 PM Performed by: Darral Dash, DO, anesthesiologist  Patient location: OOR procedure area. Preanesthetic checklist: patient identified, IV checked, site marked, risks and benefits discussed, surgical consent, monitors and equipment checked, pre-op evaluation, timeout performed and anesthesia consent Emergency situation Left, radial was placed Catheter size: 20 G Hand hygiene performed  and maximum sterile barriers used   Attempts: 2 Procedure performed using ultrasound guided technique. Ultrasound Notes:anatomy identified, needle tip was noted to be adjacent to the nerve/plexus identified and no ultrasound evidence of intravascular and/or intraneural injection Following insertion, dressing applied and Biopatch. Post procedure assessment: normal and unchanged  Post procedure complications: local hematoma. Patient tolerated the procedure well with no immediate complications.

## 2021-11-12 NOTE — ED Notes (Signed)
[facility] to transport to [facility]

## 2021-11-12 NOTE — Progress Notes (Signed)
 EMS en route @ 2212 EMS arrived @ 2216 EMS left with pt @ 2219

## 2021-11-12 NOTE — Progress Notes (Signed)
Called daughter, Vikki Ports, and verified LKW of 2020

## 2021-11-12 NOTE — ED Notes (Signed)
Pt back to ct with rn assist.

## 2021-11-12 NOTE — Consult Note (Signed)
[facility] TeleNeurology Consult Services   Patient Name:   Hannah Zimmerman, Hannah Zimmerman Date of Birth:   14-Jan-1971 Identification Number:   MRN - [id number] Date of Service:   11/12/2021 21:28:54  Diagnosis:       I63.9 - Cerebrovascular accident (CVA), unspecified mechanism ([place])  Impression:      74 F, h/o afib on eliquis, LKW at 8:20 PM, developed acute onset slurred speech and L hemiplegia while talking with daughter at home. NIHSS 20. Not a thrombolytic candidate due to eliquis. Head CT shows R MCA hyperdense sign. CTA head/neck shows R carotid occlusion. CTP shows 67 cc core infarct with 180 cc penumbra, mismatch vol 113 cc, mismatch ratio 2.7. I spoke to Neurology at [facility] and they have accepted the case for transfer and thrombectomy.  PLAN - transfer to [facility] for thrombectomy  ---  Our recommendations are outlined below.  Recommendations:        Stroke/Telemetry Floor       Neuro Checks       Bedside Swallow Eval       DVT Prophylaxis       IV Fluids, Normal Saline       Head of Bed 30 Degrees       Euglycemia and Avoid Hyperthermia (PRN Acetaminophen)   Sign Out:       Discussed with Emergency Department Provider    ------------------------------------------------------------------------------  Advanced Imaging: CTA Head and Neck Completed.  CTP Completed.  LVO:Yes  Discussed with NIR:Yes  Discussed with NIR Time:11/12/2021 21:58:11  Discussed with NIR Text:  accepted for transfer to main [place] facility   Metrics: Last Known Well: 11/12/2021 20:20:00 [facility] Notification Time: 11/12/2021 21:28:54 Arrival Time: 11/12/2021 21:19:00 Stamp Time: 11/12/2021 21:28:54 Initial Response Time: 11/12/2021 21:36:40 Symptoms: L sided wkness. NIHSS Start Assessment Time: 11/12/2021 22:07:54 Patient is not a candidate for Thrombolytic. Thrombolytic Medical Decision: 11/12/2021 21:47:25 Patient was not deemed  candidate for Thrombolytic because of following reasons: Use of NOAs within 48 hours.  I personally reviewed the CT head  ED Physician notified of diagnostic impression and management plan on 11/12/2021 22:16:15    ------------------------------------------------------------------------------  History of Present Illness: Patient is a 51 year old Female.  Patient was brought by EMS for symptoms of L sided wkness. 46 F, h/o afib on eliquis, who was LKW at 8:20 PM. She was at home talking to her daughter. Daughter noted that pt became acutely lethargic with new onset L sided wkness and slurred speech. On arrival to ER, NIHSS 20 for L hemiplegia, L facial droop, L sided numbness, R gaze deviation, dysarthria, L field cut, and L neglect. History provided by daughter Vikki Ports [phone].   Past Medical History:      Atrial Fibrillation  Anticoagulant use:  Yes eliquis No Antiplatelet use Reviewed EMR for current medications  Allergies:  Reviewed  Social History: Alcohol Use: Yes Drug Use: No   Examination: 1A: Level of Consciousness - Requires repeated stimulation to arouse + 2 1B: Ask Month and Age - Both Questions Right + 0 1C: Blink Eyes & Squeeze Hands - Performs Both Tasks + 0 2: Test Horizontal Extraocular Movements - Forced Gaze Palsy: Cannot Be Overcome + 2 3: Test Visual Fields - Complete Hemianopia + 2 4: Test Facial Palsy (Use Grimace if Obtunded) - Partial paralysis (lower face) + 2 5A: Test Left Arm Motor Drift - No Movement + 4 5B: Test Right Arm Motor Drift - No Drift for 10 Seconds +  0 6A: Test Left Leg Motor Drift - No Effort Against Gravity + 3 6B: Test Right Leg Motor Drift - No Drift for 5 Seconds + 0 7: Test Limb Ataxia (FNF/Heel-Shin) - No Ataxia + 0 8: Test Sensation - Complete Loss: Cannot Sense Being Touched At All + 2 9: Test Language/Aphasia - Normal; No aphasia + 0 10: Test Dysarthria - Mild-Moderate Dysarthria: Slurring but can be understood +  1 11: Test Extinction/Inattention - Profound hemi-inattention (ex: does not recognize own hand) + 2  NIHSS Score: 20   Pre-Morbid Modified Rankin Scale: 0 Points = No symptoms at all   Patient/Family was informed the Neurology Consult would occur via TeleHealth consult by way of interactive audio and video telecommunications and consented to receiving care in this manner.   Patient is being evaluated for possible acute neurologic impairment and high probability of imminent or life-threatening deterioration. I spent total of 21 minutes providing care to this patient, including time for face to face visit via telemedicine, review of medical records, imaging studies and discussion of findings with providers, the patient and/or family.   Dr Burtis Junes   [facility] [phone]   Case [id number]

## 2021-11-12 NOTE — Anesthesia Preprocedure Evaluation (Addendum)
Anesthesia Evaluation  Patient identified by MRN, date of birth, ID band Patient confused    Reviewed: Allergy & Precautions, NPO status , Patient's Chart, lab work & pertinent test results  Airway Mallampati: II  TM Distance: >3 FB Neck ROM: Full    Dental  (+) Poor Dentition, Chipped, Missing,    Pulmonary neg pulmonary ROS,    Pulmonary exam normal        Cardiovascular + dysrhythmias (on Eliquis) Atrial Fibrillation  Rhythm:Irregular Rate:Tachycardia     Neuro/Psych CODE STROKE negative psych ROS   GI/Hepatic negative GI ROS, Neg liver ROS,   Endo/Other  negative endocrine ROS  Renal/GU negative Renal ROS  negative genitourinary   Musculoskeletal negative musculoskeletal ROS (+)   Abdominal Normal abdominal exam  (+)   Peds  Hematology negative hematology ROS (+)   Anesthesia Other Findings   Reproductive/Obstetrics                            Anesthesia Physical Anesthesia Plan  ASA: 4 and emergent  Anesthesia Plan: General   Post-op Pain Management:    Induction: Intravenous and Rapid sequence  PONV Risk Score and Plan: 3 and Ondansetron, Dexamethasone and Treatment may vary due to age or medical condition  Airway Management Planned: Mask and Oral ETT  Additional Equipment: Arterial line  Intra-op Plan:   Post-operative Plan: Possible Post-op intubation/ventilation  Informed Consent: I have reviewed the patients History and Physical, chart, labs and discussed the procedure including the risks, benefits and alternatives for the proposed anesthesia with the patient or authorized representative who has indicated his/her understanding and acceptance.     Dental advisory given  Plan Discussed with:   Anesthesia Plan Comments: (Lab Results      Component                Value               Date                      WBC                      6.7                 11/12/2021                 HGB                      15.3 (H)            11/12/2021                HCT                      43.6                11/12/2021                MCV                      99.5                11/12/2021                PLT                      210  11/12/2021           Lab Results      Component                Value               Date                      NA                       139                 11/12/2021                K                        2.7 (LL)            11/12/2021                CO2                      26                  11/12/2021                GLUCOSE                  118 (H)             11/12/2021                BUN                      9                   11/12/2021                CREATININE               0.58                11/12/2021                CALCIUM                  8.9                 11/12/2021                GFRNONAA                 >60                 11/12/2021          )        Anesthesia Quick Evaluation

## 2021-11-12 NOTE — Progress Notes (Signed)
Pt left for CT @ 2120 Pt Returned @ 2128 Neuro [facility] paged @ 2128 Pt left for CTA/Perfusion @ 2140 Neuro [facility] Connected @ 2141

## 2021-11-12 NOTE — ED Provider Notes (Signed)
[facility] Provider Note   Event Date/Time   First MD Initiated Contact with Patient 11/12/21 2130     (approximate) History  Code Stroke  HPI SHANTIA SANFORD is a 51 y.o. female   with unknown past medical history who presents for acute onset left-sided weakness beginning 2020 tonight as well as slurred speech.  LVO criteria 3.  States history of atrial fibrillation on Eliquis.  Further history and review of systems unable to be obtained secondary to medical necessity and patient acuity   Physical Exam  Triage Vital Signs: ED Triage Vitals  Enc Vitals Group     BP 11/12/21 2132 113/85     Pulse Rate 11/12/21 2132 (!) 101     Resp 11/12/21 2132 18     Temp 11/12/21 2137 97.6 F (36.4 C)     Temp Source 11/12/21 2132 Oral     SpO2 11/12/21 2132 95 %     Weight 11/12/21 2131 147 lb 11.3 oz (67 kg)     Height --      Head Circumference --      Peak Flow --      Pain Score --      Pain Loc --      Pain Edu? --      Excl. in [place]? --    Most recent vital signs: Vitals:   11/12/21 2205 11/12/21 2216  BP: 101/73 101/73  Pulse: 97 (!) 101  Resp: 20 18  Temp:  97.8 F (36.6 C)  SpO2: 92% 100%   General: Awake, oriented x4. CV:  Good peripheral perfusion.  Resp:  Normal effort.  Abd:  No distention.  Other:  NIH SS 24-left hemiplegia, left facial droop, left-sided numbness, right gaze deviation, dysarthria, left visual field cut, left hemineglect ED Results / Procedures / Treatments  Labs (all labs ordered are listed, but only abnormal results are displayed) Labs Reviewed  APTT - Abnormal; Notable for the following components:      Result Value   aPTT 37 (*)    All other components within normal limits  CBC - Abnormal; Notable for the following components:   Hemoglobin 15.3 (*)    MCH 34.9 (*)    All other components within normal limits  COMPREHENSIVE METABOLIC PANEL - Abnormal; Notable for the following components:   Potassium 2.7 (*)     Glucose, Bld 118 (*)    Total Protein 6.2 (*)    ALT 51 (*)    All other components within normal limits  ETHANOL - Abnormal; Notable for the following components:   Alcohol, Ethyl (B) 12 (*)    All other components within normal limits  CBG MONITORING, ED - Abnormal; Notable for the following components:   Glucose-Capillary 110 (*)    All other components within normal limits  RESP PANEL BY RT-PCR (FLU A&B, COVID) ARPGX2  PROTIME-INR  DIFFERENTIAL  POC URINE PREG, ED   RADIOLOGY ED MD interpretation: CT of the head without contrast as interpreted by me shows a dense right MCA concerning for thrombosis without definite cortical hypodensity -Agree with radiology assessment Official radiology report(s): No results found. PROCEDURES: Critical Care performed: Yes, see critical care procedure note(s) Procedures MEDICATIONS ORDERED IN ED: Medications  iohexol (OMNIPAQUE) 350 MG/ML injection 100 mL (100 mLs Intravenous Contrast Given 11/12/21 2159)   IMPRESSION / MDM / ASSESSMENT AND PLAN / ED COURSE  I reviewed the triage vital signs and the nursing notes.  The patient is on the cardiac monitor to evaluate for evidence of arrhythmia and/or significant heart rate changes. CVA stroke alert PMH risk factors: Hypertension, hyperlipidemia Neurologic Deficits: Left hemiplegia, left facial droop, left-sided numbness, right gaze deviation, dysarthria, left visual field cut, left hemineglect Last known Well Time: 2020 NIH Stroke Score: 24 Given History and Exam I have lower suspicion for infectious etiology, neurologic changes secondary to toxicologic ingestion, seizure, complex migraine. Presentation concerning for possible stroke requiring workup.  Workup: Labs: POC glucose, CBC, BMP, LFTs, Troponin, PT/INR, PTT, Type and Screen Other Diagnostics: ECG, CXR, non-contrast head CT followed by CTA brain and neck (to r/o large vessel occlusion amenable to  thrombectomy) Interventions: Patient low on NIH scale and out of the window for tpa.  Consult: Neurology. Discussed with Dr. Malen Gauze regarding patients neurological symptoms and last well-known time and eligibility for TPA criteria.  Agree with no tPA administration given Eliquis use today however patient is a candidate for thrombectomy.  Therefore patient will be transferred to [place] for neuro vascular intervention Disposition: Transfer to [place] IMPRESSION(S) / ED DIAGNOSES   Final diagnoses:  Cerebrovascular accident (CVA) due to occlusion of right middle cerebral artery ([place])   Rx / DC Orders   ED Discharge Orders     None      Note:  This document was prepared using Dragon voice recognition software and may include unintentional dictation errors.   Naaman Plummer, MD 11/22/21 0930

## 2021-11-12 NOTE — H&P (Signed)
Neurology H&P  CC: Left-sided weakness-transfer for intervention from [facility]  History is obtained from: Chart review  HPI: Hannah Zimmerman is a 51 y.o. female past medical history of atrial fibrillation on Eliquis compliant with medication, status post cardioversion 11/08/2021, presented to the emergency room at [facility] for sudden onset of lethargy, left-sided weakness and slurred speech.  On arrival at [facility], NIH stroke scale was 24 left hemiplegia, left facial droop left sided numbness, right gaze deviation, dysarthria and left field cut and left hemineglect with last known well time of 8:20 PM. Not a candidate for IV thrombolysis due to Eliquis use same day. Noncontrast head CT with aspects 10 and developing hypodensity in the right hemisphere with a very prominent dense right MCA. CTA head and neck along with perfusion with nonopacification of proximal to mid right ICA extending into the intracranial portions of the terminus with no opacification of the right MCA proximally with some contrast in the distal right MCA vessels consistent with a dense MCA sign seen on the CT head.  No other large vessel occlusion or significant stenosis. The telemedicine neurologist had called me as soon as the patient's head CT had become available even before examining the patient as they were waiting for the patient to be brought back to the room and I had called our transfer center to arrange for emergent transfer for thrombectomy after discussing with the interventionalist   LKW: 8:20 PM tpa given?: no, on Eliquis0   ROS: Full ROS was performed and is negative except as noted in the HPI.   Problem list from the other record that she has under MRN: [id number] Current MRN [id number]  (HFpEF) heart failure with preserved ejection fraction ([place])      a. 07/2019 Echo: EF 60-65%, mod LVH. Sev dil LA, mildly dil RA. Mod elev PASP; b. 07/2020 TEE: EF 50-55%,  no rwma, sev conc LVH. Nl RV fxn. Mod dil RA. Mild-mod MR, mod TR.   Agatston coronary artery calcium score between 200 and 399      a. 07/2020 Cardiac CT: Cor Ca2+ = 338 (99th %'ile).   Complication of anesthesia 20 years ago    a. spinal with first c section went too high stopped breathing, low bp with 2nd c section, no further issues with anesthesia   Heart murmur      a. 07/2019 Echo: no significant valvular dzs; b. 07/2020 TEE: Mild-mod MR, mod TR.   Hypertrophic cardiomyopathy ([place])      a. 07/2019 Echo: Mod LVH; b. 01/2020 cMRI: EF 67%, HCM w/o obstruction. Max wall thickness 32mm. Sev LAE w/ L->R atrial level shunt. Large area of LGE @ mid-ventricular level in area of max wall thickness; c. 07/2020 Cardiac CT: Asymm hypertrophy up to 92mm in mid inferoseptum consistent w/ known HCM.   Morbid obesity ([place])      a. 09/2014 s/p gastric bypass.   Persistent atrial fibrillation ([place])      a. Dx 07/2019-->CHA2DS2VASc = 2 (diast CHF/Fem)-->Eliquis; b. 09/2018 s/p DCCV (150J (biphasic) x 1); c. 12/2019 RFCA/PVI; d. 07/2020 repeat RFCA/PVI.   Rash      both arms from old bed bug bites, healing   Tobacco abuse     Typical atrial flutter ([place])      a. 12/2019 s/p RFCA.         Past Surgical History:  Procedure Laterality Date   ABDOMINAL HYSTERECTOMY       ANKLE SURGERY  left x 2   ATRIAL FIBRILLATION ABLATION N/A 12/24/2019    Procedure: ATRIAL FIBRILLATION ABLATION;  Surgeon: Constance Haw, MD;  Location: [place] [facility];  Service: Cardiovascular;  Laterality: N/A;   ATRIAL FIBRILLATION ABLATION N/A 08/11/2020    Procedure: ATRIAL FIBRILLATION ABLATION;  Surgeon: Constance Haw, MD;  Location: [place] [facility];  Service: Cardiovascular;  Laterality: N/A;   CARDIOVERSION N/A 09/24/2019    Procedure: CARDIOVERSION;  Surgeon: Minna Merritts, MD;  Location: [place] [facility];  Service: Cardiovascular;  Laterality: N/A;   CARDIOVERSION N/A 10/17/2019    Procedure: CARDIOVERSION;   Surgeon: Minna Merritts, MD;  Location: [place] [facility];  Service: Cardiovascular;  Laterality: N/A;   CARDIOVERSION N/A 11/08/2021    Procedure: CARDIOVERSION;  Surgeon: Minna Merritts, MD;  Location: [facility];  Service: Cardiovascular;  Laterality: N/A;   CESAREAN SECTION        x 3   LAPAROSCOPIC GASTRIC SLEEVE RESECTION WITH HIATAL HERNIA REPAIR   08/17/2015    Procedure: LAPAROSCOPIC GASTRIC SLEEVE RESECTION WITH HIATAL HERNIA REPAIR;  Surgeon: Johnathan Hausen, MD;  Location: [facility];  Service: General;;   TEE WITHOUT CARDIOVERSION N/A 08/11/2020    Procedure: TRANSESOPHAGEAL ECHOCARDIOGRAM (TEE);  Surgeon: Constance Haw, MD;  Location: [place] [facility];  Service: Cardiovascular;  Laterality: N/A;   TEE WITHOUT CARDIOVERSION N/A 11/08/2021    Procedure: TRANSESOPHAGEAL ECHOCARDIOGRAM (TEE);  Surgeon: Minna Merritts, MD;  Location: [facility];  Service: Cardiovascular;  Laterality: N/A;   TUBAL LIGATION        x 2   UPPER GI ENDOSCOPY   08/17/2015    Procedure: UPPER GI ENDOSCOPY;  Surgeon: Johnathan Hausen, MD;  Location: [facility];  Service: General;;    No family history on file.   Social History:  Positive for tobacco abuse  Medications  Current Facility-Administered Medications:     stroke: mapping our early stages of recovery book, , Does not apply, Once, Amie Portland, MD   0.9 %  sodium chloride infusion, , Intravenous, Continuous, Amie Portland, MD   acetaminophen (TYLENOL) tablet 650 mg, 650 mg, Oral, Q4H PRN **OR** acetaminophen (TYLENOL) 160 MG/5ML solution 650 mg, 650 mg, Per Tube, Q4H PRN **OR** acetaminophen (TYLENOL) suppository 650 mg, 650 mg, Rectal, Q4H PRN, Amie Portland, MD   ceFAZolin (ANCEF) 2-4 GM/100ML-% IVPB, , , ,    fentaNYL (SUBLIMAZE) 100 MCG/2ML injection, , , ,    iohexol (OMNIPAQUE) 300 MG/ML solution 100 mL, 100 mL, Intra-arterial, Once PRN, Deveshwar, Sanjeev, MD   nitroGLYCERIN 100 mcg/mL intra-arterial injection, , , ,    nitroGLYCERIN 25 mcg  in sodium chloride (PF) 0.9 % 59.71 mL (25 mcg/mL) syringe, , Intra-arterial, PRN, Deveshwar, Sanjeev, MD, 25 mcg at 11/12/21 2327   potassium chloride 10 mEq in 100 mL IVPB, 10 mEq, Intravenous, Q1 Hr x 6, Amie Portland, MD   senna-docusate (Senokot-S) tablet 1 tablet, 1 tablet, Oral, QHS PRN, Amie Portland, MD No current outpatient medications on file.  Facility-Administered Medications Ordered in Other Encounters:    0.9 %  sodium chloride infusion, , Intravenous, Continuous PRN, Suzy Bouchard, CRNA, New Bag at 11/12/21 2249   ceFAZolin (ANCEF) IVPB 2 g/50 mL premix, , Intravenous, Anesthesia Intra-op, Suzy Bouchard, CRNA, 2 g at 11/12/21 2301   esmolol (BREVIBLOC) injection, , Intravenous, Anesthesia Intra-op, Suzy Bouchard, CRNA, 30 mg at 11/12/21 2252   fentaNYL (SUBLIMAZE) injection, , Intravenous, Anesthesia Intra-op, Suzy Bouchard, CRNA, 25 mcg at 11/12/21  2323   lidocaine (cardiac) 50 mg/18mL (XYLOCAINE) injection 1%, , Intravenous, Anesthesia Intra-op, Suzy Bouchard, CRNA, 60 mg at 11/12/21 2252   phenylephrine (NEO-SYNEPHRINE) 20mg /NS 253mL premix infusion, , Intravenous, Continuous PRN, Suzy Bouchard, CRNA, Last Rate: 56.25 mL/hr at 11/12/21 2327, 75 mcg/min at 11/12/21 2327   propofol (DIPRIVAN) 10 mg/mL bolus/IV push, , Intravenous, Anesthesia Intra-op, Suzy Bouchard, CRNA, 150 mg at 11/12/21 2252   rocuronium (ZEMURON) injection, , Intravenous, Anesthesia Intra-op, Suzy Bouchard, CRNA, 50 mg at 11/12/21 2302   succinylcholine (ANECTINE) syringe, , Intravenous, Anesthesia Intra-op, Suzy Bouchard, CRNA, 110 mg at 11/12/21 2252   Exam: Current vital signs: There were no vitals taken for this visit. Vital signs in last 24 hours: Temp:  [97.6 F (36.4 C)-97.8 F (36.6 C)] 97.8 F (36.6 C) (02/25 2216) Pulse Rate:  [97-101] 101 (02/25 2216) Resp:  [18-21] 18 (02/25 2216) BP: (101-113)/(61-85) 101/73 (02/25 2216) SpO2:  [92 %-100 %] 100 % (02/25  2216) Weight:  [54 kg] 67 kg (02/25 2131) General:: Patient is drowsy, arouses to voice and answers questions but keeps eyes closed HEENT: Normocephalic atraumatic CVS: RRR Respiratory: Breathing well saturating normally on room air Abdomen nondistended nontender Neurological exam Drowsy, arouses to voice but keeps falling asleep.  Able to answer questions appropriately with poor attention concentration Speech is mildly dysarthric No evidence of aphasia Cranial nerves: Pupils are equal round reactive to light, forced gaze deviation to the right, complete left hemianopsia, complete lower facial weakness and diminished sensation on the left side of the face. Motor exam: Maybe 1/5 strength in the left upper and lower extremity.  Absolutely no movement against resistance.  Right side is full strength. Sensation is completely lost on the left with complete neglect of the left side-does not recognize her own left hand when brought towards her right field of vision. Coordination: No dysmetria on the right, cannot test on the left Gait testing deferred NIHSS 1a Level of Conscious.: 1 1b LOC Questions: 0 1c LOC Commands: 0 2 Best Gaze: 2 3 Visual: 2 4 Facial Palsy: 2 5a Motor Arm - left: 3 5b Motor Arm - Right: 0 6a Motor Leg - Left: 3 6b Motor Leg - Right: 0 7 Limb Ataxia: 0 8 Sensory: 2 9 Best Language:0  10 Dysarthria: 2 11 Extinct. and Inatten.: 2 TOTAL: 19  Labs I have reviewed labs in epic and the results pertinent to this consultation are:  CBC    Component Value Date/Time   WBC 6.7 11/12/2021 2115   RBC 4.38 11/12/2021 2115   HGB 15.3 (H) 11/12/2021 2115   HCT 43.6 11/12/2021 2115   PLT 210 11/12/2021 2115   MCV 99.5 11/12/2021 2115   MCH 34.9 (H) 11/12/2021 2115   MCHC 35.1 11/12/2021 2115   RDW 13.0 11/12/2021 2115   LYMPHSABS 2.1 11/12/2021 2115   MONOABS 0.7 11/12/2021 2115   EOSABS 0.3 11/12/2021 2115   BASOSABS 0.1 11/12/2021 2115    CMP     Component  Value Date/Time   NA 139 11/12/2021 2115   K 2.7 (LL) 11/12/2021 2115   CL 101 11/12/2021 2115   CO2 26 11/12/2021 2115   GLUCOSE 118 (H) 11/12/2021 2115   BUN 9 11/12/2021 2115   CREATININE 0.58 11/12/2021 2115   CALCIUM 8.9 11/12/2021 2115   PROT 6.2 (L) 11/12/2021 2115   ALBUMIN 3.6 11/12/2021 2115   AST 28 11/12/2021 2115   ALT 51 (H) 11/12/2021 2115   ALKPHOS  86 11/12/2021 2115   BILITOT 0.7 11/12/2021 2115   GFRNONAA >60 11/12/2021 2115   Imaging I have reviewed the images obtained:  CT-head-aspects 10.  Hyperdense right MCA. CTA head and neck with complete nonopacification of the proximal to mid right ICA which extends to the intracranial portions of the terminus.  No opacification of the right MCA.  Distal right MCA vessels have some opacification likely collateral flow. CT perfusion study with CBF less than 30 of 67 cc and Tmax greater than 6 of 180 cc with a mismatch volume of 130 mL with mismatch ratio 2.7 in the right MCA territory.  Assessment:  51 year old woman with history of atrial fibrillation on Eliquis presenting to the emergency room at [facility] with sudden onset of right MCA syndrome-CT head noted with dense right MCA and occluded cervical right ICA. Not a candidate for IV TNKase due to being on Eliquis. Case discussed with the telemetry specialist and patient excepted for intervention after discussion with interventionalist Dr. Estanislado Pandy. Patient transported via [facility] due to nonavailability of a [facility] truck. Consent obtained over the phone from patient's daughter by Dr. Reynolds Bowl for. Chelsea Martin-risks and benefits of the procedure including chances of bleeding, necessitation for stenting due to the cervical carotid occlusion explained in detail.  All questions answered. Will be admitted to the neurological ICU for further monitoring and work-up  CT head with an aspect 10.  CT perfusion study was done in spite of her not being outside  the 6-hour window and aspects being 9-did show a large core-nearly 70 cc although it is less than 70 cc and meets criteria-but I am not sure if that truly represents irreversible damage.  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  Thrombolytics to be decided based on whether stent is used or not and the postprocedure CT head -Blood pressure control, goal after endovascular intervention -MRI/ECHO/A1C/Lipid panel. -Hyperglycemia management per SSI to maintain glucose 140-180mg /dL. -PT/OT/ST therapies and recommendations when able  CNS -Close neuro monitoring -Groin checks per post endovascular protocol  Dysarthria Dysphagia following cerebral infarction  -NPO until cleared by speech/bedside swallow -ST -Advance diet as tolerated  Hemiplegia and hemiparesis following cerebral infarction affecting left non-dominant side  -PT/OT -PM&R consult   RESP Intubated for emergent thrombectomy. Extubated if able to after the procedure. If remains intubated, will request PCCM consultation for vent management.  CV Transthoracic echo Check lipid panel Blood pressure goals after endovascular procedure completed based on whether successfully revascularized or not.  Paroxysmal atrial fibrillation -Hold anticoagulation for now -Rate controlled -May need cardiology consultation  HEME No active issues Monitor CBC in the morning  ENDO No active issues Check A1c Goal A1c less than 7  GI/GU No active issues -avoid nephrotoxic agents -Gentle hydration  Fluid/Electrolyte Disorders Hypokalemic Ordered potassium replacement Check BMP in the morning  ID Possible Aspiration PNA -CXR -NPO -Monitor   Prophylaxis DVT: SCD GI: PPI Bowel: Docusate  senna  Diet: NPO until cleared by speech  Code Status: Full Code    THE FOLLOWING WERE PRESENT ON ADMISSION: Acute ischemic stroke, hemiplegia, possible aspiration pneumonia  CRITICAL CARE ATTESTATION Performed by: Amie Portland, MD Total critical care time: 32 minutes Critical care time was exclusive of separately billable procedures and treating other patients and/or supervising APPs/Residents/Students Critical care was necessary to  treat or prevent imminent or life-threatening deterioration due to acute ischemic stroke requiring endovascular revascularization This patient is critically ill and at significant risk for neurological worsening and/or death and care requires constant monitoring. Critical care was time spent personally by me on the following activities: development of treatment plan with patient and/or surrogate as well as nursing, discussions with consultants, evaluation of patient's response to treatment, examination of patient, obtaining history from patient or surrogate, ordering and performing treatments and interventions, ordering and review of laboratory studies, ordering and review of radiographic studies, pulse oximetry, re-evaluation of patient's condition, participation in multidisciplinary rounds and medical decision making of high complexity in the care of this patient.

## 2021-11-12 NOTE — Progress Notes (Signed)
Per Neuro [facility] (Dr. Nicoletta Dress) request called [facility] for [facility] to speak with neurohospitalist at [facility] about transfer and possible thrombectomy @ 2149  Case accepted @ 2158

## 2021-11-12 NOTE — Progress Notes (Signed)
EMS Pre-Alert Code Stroke Activated

## 2021-11-13 ENCOUNTER — Inpatient Hospital Stay (HOSPITAL_COMMUNITY): Payer: Commercial Managed Care - HMO

## 2021-11-13 ENCOUNTER — Other Ambulatory Visit: Payer: Self-pay

## 2021-11-13 ENCOUNTER — Encounter (HOSPITAL_COMMUNITY): Payer: Self-pay | Admitting: Student in an Organized Health Care Education/Training Program

## 2021-11-13 DIAGNOSIS — I6389 Other cerebral infarction: Secondary | ICD-10-CM

## 2021-11-13 DIAGNOSIS — I4819 Other persistent atrial fibrillation: Secondary | ICD-10-CM

## 2021-11-13 DIAGNOSIS — I6601 Occlusion and stenosis of right middle cerebral artery: Secondary | ICD-10-CM | POA: Diagnosis present

## 2021-11-13 DIAGNOSIS — I639 Cerebral infarction, unspecified: Secondary | ICD-10-CM | POA: Diagnosis not present

## 2021-11-13 HISTORY — PX: IR PERCUTANEOUS ART THROMBECTOMY/INFUSION INTRACRANIAL INC DIAG ANGIO: IMG6087

## 2021-11-13 HISTORY — PX: IR ANGIO EXTRACRAN SEL COM CAROTID INNOMINATE UNI L MOD SED: IMG5355

## 2021-11-13 HISTORY — PX: IR CT HEAD LTD: IMG2386

## 2021-11-13 LAB — ECHOCARDIOGRAM COMPLETE
AR max vel: 2.34 cm2
AV Area VTI: 2.44 cm2
AV Area mean vel: 2.21 cm2
AV Mean grad: 3 mmHg
AV Peak grad: 6.1 mmHg
Ao pk vel: 1.24 m/s
Area-P 1/2: 3.89 cm2
MV M vel: 4.29 m/s
MV Peak grad: 73.6 mmHg
Radius: 0.4 cm
S' Lateral: 2.05 cm
Single Plane A4C EF: 50.4 %
Weight: 2363.33 oz

## 2021-11-13 LAB — CBC WITH DIFFERENTIAL/PLATELET
Abs Immature Granulocytes: 0.02 10*3/uL (ref 0.00–0.07)
Basophils Absolute: 0.1 10*3/uL (ref 0.0–0.1)
Basophils Relative: 1 %
Eosinophils Absolute: 0.1 10*3/uL (ref 0.0–0.5)
Eosinophils Relative: 1 %
HCT: 39.8 % (ref 36.0–46.0)
Hemoglobin: 13.8 g/dL (ref 12.0–15.0)
Immature Granulocytes: 0 %
Lymphocytes Relative: 15 %
Lymphs Abs: 1.3 10*3/uL (ref 0.7–4.0)
MCH: 35.2 pg — ABNORMAL HIGH (ref 26.0–34.0)
MCHC: 34.7 g/dL (ref 30.0–36.0)
MCV: 101.5 fL — ABNORMAL HIGH (ref 80.0–100.0)
Monocytes Absolute: 0.6 10*3/uL (ref 0.1–1.0)
Monocytes Relative: 8 %
Neutro Abs: 6.2 10*3/uL (ref 1.7–7.7)
Neutrophils Relative %: 75 %
Platelets: 228 10*3/uL (ref 150–400)
RBC: 3.92 MIL/uL (ref 3.87–5.11)
RDW: 13.1 % (ref 11.5–15.5)
WBC: 8.3 10*3/uL (ref 4.0–10.5)
nRBC: 0 % (ref 0.0–0.2)

## 2021-11-13 LAB — LIPID PANEL
Cholesterol: 137 mg/dL (ref 0–200)
HDL: 41 mg/dL (ref >40)
LDL Cholesterol: 87 mg/dL (ref 0–99)
Total CHOL/HDL Ratio: 3.3 RATIO
Triglycerides: 45 mg/dL (ref <150)
VLDL: 9 mg/dL (ref 0–40)

## 2021-11-13 LAB — BASIC METABOLIC PANEL
Anion gap: 7 (ref 5–15)
BUN: 6 mg/dL (ref 6–20)
CO2: 24 mmol/L (ref 22–32)
Calcium: 8 mg/dL — ABNORMAL LOW (ref 8.9–10.3)
Chloride: 108 mmol/L (ref 98–111)
Creatinine, Ser: 0.47 mg/dL (ref 0.44–1.00)
GFR, Estimated: >60 mL/min (ref >60)
Glucose, Bld: 99 mg/dL (ref 70–99)
Potassium: 3.7 mmol/L (ref 3.5–5.1)
Sodium: 139 mmol/L (ref 135–145)

## 2021-11-13 LAB — HEPARIN LEVEL (UNFRACTIONATED): Heparin Unfractionated: >1.1 IU/mL — ABNORMAL HIGH (ref 0.30–0.70)

## 2021-11-13 LAB — MRSA NEXT GEN BY PCR, NASAL: MRSA by PCR Next Gen: NOT DETECTED

## 2021-11-13 LAB — APTT
aPTT: 34 seconds (ref 24–36)
aPTT: 36 seconds (ref 24–36)

## 2021-11-13 LAB — HEMOGLOBIN A1C
Hgb A1c MFr Bld: 5.1 % (ref 4.8–5.6)
Mean Plasma Glucose: 99.67 mg/dL

## 2021-11-13 LAB — HIV ANTIBODY (ROUTINE TESTING W REFLEX): HIV Screen 4th Generation wRfx: NONREACTIVE

## 2021-11-13 MED ORDER — MIDODRINE HCL 5 MG PO TABS
10.0000 mg | ORAL_TABLET | Freq: Three times a day (TID) | ORAL | Status: DC
  Administered 2021-11-13 – 2021-11-14 (×3): 10 mg via ORAL
  Filled 2021-11-13 (×3): qty 2

## 2021-11-13 MED ORDER — NICOTINE 21 MG/24HR TD PT24
21.0000 mg | MEDICATED_PATCH | Freq: Every day | TRANSDERMAL | Status: DC
  Administered 2021-11-15: 21 mg via TRANSDERMAL
  Filled 2021-11-13: qty 1

## 2021-11-13 MED ORDER — ACETAMINOPHEN 650 MG RE SUPP
650.0000 mg | RECTAL | Status: DC | PRN
Start: 2021-11-13 — End: 2021-11-15

## 2021-11-13 MED ORDER — DILTIAZEM HCL-DEXTROSE 125-5 MG/125ML-% IV SOLN (PREMIX): 5.0000 mg/h | INTRAVENOUS | Status: DC

## 2021-11-13 MED ORDER — PHENYLEPHRINE HCL-NACL 20-0.9 MG/250ML-% IV SOLN
0.0000 ug/min | INTRAVENOUS | Status: DC
  Administered 2021-11-13: 90 ug/min via INTRAVENOUS
  Administered 2021-11-13: 70 ug/min via INTRAVENOUS
  Administered 2021-11-13: 20 ug/min via INTRAVENOUS
  Administered 2021-11-13: 70 ug/min via INTRAVENOUS
  Filled 2021-11-13 (×4): qty 250

## 2021-11-13 MED ORDER — FLECAINIDE ACETATE 100 MG PO TABS
100.0000 mg | ORAL_TABLET | Freq: Two times a day (BID) | ORAL | Status: DC
  Administered 2021-11-13 (×2): 100 mg via ORAL
  Filled 2021-11-13 (×3): qty 1

## 2021-11-13 MED ORDER — ACETAMINOPHEN 160 MG/5ML PO SOLN: 650.0000 mg | ORAL | Status: DC | PRN

## 2021-11-13 MED ORDER — ONDANSETRON HCL 4 MG/2ML IJ SOLN
INTRAMUSCULAR | Status: DC | PRN
  Administered 2021-11-13: 4 mg via INTRAVENOUS

## 2021-11-13 MED ORDER — CLEVIDIPINE BUTYRATE 0.5 MG/ML IV EMUL: 0.0000 mg/h | INTRAVENOUS | Status: AC

## 2021-11-13 MED ORDER — ESMOLOL HCL-SODIUM CHLORIDE 2000 MG/100ML IV SOLN
25.0000 ug/kg/min | INTRAVENOUS | Status: DC
  Filled 2021-11-13: qty 100

## 2021-11-13 MED ORDER — ORAL CARE MOUTH RINSE
15.0000 mL | Freq: Two times a day (BID) | OROMUCOSAL | Status: DC
  Administered 2021-11-13 (×2): 15 mL via OROMUCOSAL

## 2021-11-13 MED ORDER — ACETAMINOPHEN 325 MG PO TABS
650.0000 mg | ORAL_TABLET | ORAL | Status: DC | PRN
  Administered 2021-11-13 – 2021-11-15 (×6): 650 mg via ORAL
  Filled 2021-11-13 (×6): qty 2

## 2021-11-13 MED ORDER — HEPARIN (PORCINE) 25000 UT/250ML-% IV SOLN
1450.0000 [IU]/h | INTRAVENOUS | Status: DC
  Administered 2021-11-13: 800 [IU]/h via INTRAVENOUS
  Administered 2021-11-14: 1200 [IU]/h via INTRAVENOUS
  Administered 2021-11-15: 1350 [IU]/h via INTRAVENOUS
  Filled 2021-11-13 (×3): qty 250

## 2021-11-13 MED ORDER — POTASSIUM CHLORIDE 10 MEQ/100ML IV SOLN
10.0000 meq | INTRAVENOUS | Status: AC
  Administered 2021-11-13 (×2): 10 meq via INTRAVENOUS

## 2021-11-13 MED ORDER — SODIUM CHLORIDE 0.9 % IV SOLN: INTRAVENOUS | Status: DC

## 2021-11-13 MED ORDER — CHLORHEXIDINE GLUCONATE CLOTH 2 % EX PADS
6.0000 | MEDICATED_PAD | Freq: Every day | CUTANEOUS | Status: DC
  Administered 2021-11-13 – 2021-11-14 (×2): 6 via TOPICAL

## 2021-11-13 NOTE — Progress Notes (Signed)
Patient ID: Hannah Zimmerman, female   DOB: December 03, 1970, 51 y.o.   MRN: [id number] INR 51 Y RT HF MRS 0 . LSW 820 pm  New onset of Rt gaze deviation,lt facial droop,lt sided neglect and Lt hemiplegia. Ct  Brain No ICH RT MCA hyperdense sign. ASPECTS 10 . CTA occluded RT ICA and RT MCA. CTP  Core of 67 ml V mismatch of 117 ml. Endovascular  treatment D/W daughter in a 3 way call. Procedure,reasons ,risks and alternatives explained. Risks of ICH of 10 % ,worsening neuro deficit,death and inability to revascularize reviewed.  Daughter expressed understanding  and provided consent for the treatment.  S.Shante Maysonet MD

## 2021-11-13 NOTE — Anesthesia Postprocedure Evaluation (Signed)
Anesthesia Post Note  Patient: Hannah Zimmerman  Procedure(s) Performed: IR WITH ANESTHESIA     Patient location during evaluation: SICU Anesthesia Type: General Level of consciousness: awake Pain management: pain level controlled Vital Signs Assessment: post-procedure vital signs reviewed and stable Respiratory status: patient connected to face mask oxygen Cardiovascular status: stable Postop Assessment: no apparent nausea or vomiting Anesthetic complications: no   No notable events documented.  Last Vitals:  Vitals:   11/13/21 0300 11/13/21 0315  BP: 117/83 (!) 120/94  Pulse: 78 86  Resp: 16 (!) 0  SpO2: 98% 100%    Last Pain:  Vitals:   11/13/21 0200  TempSrc:   PainSc: 0-No pain                 [name]

## 2021-11-13 NOTE — Interval H&P Note (Signed)
History and Physical Interval Note:  11/13/2021 2:29 PM  Hannah Zimmerman  has presented today for surgery, with the diagnosis of persistent atrial fibrillation.  The various methods of treatment have been discussed with the patient and family. After consideration of risks, benefits and other options for treatment, the patient has consented to  Procedure(s): TRANSESOPHAGEAL ECHOCARDIOGRAM (TEE) (N/A) CARDIOVERSION (N/A) as a surgical intervention.  The patient's history has been reviewed, patient examined, no change in status, stable for surgery.  I have reviewed the patient's chart and labs.  Questions were answered to the patient's satisfaction.     [name]

## 2021-11-13 NOTE — Progress Notes (Addendum)
ANTICOAGULATION CONSULT NOTE - Initial Consult  Pharmacy Consult for Heparin (Apixaban on hold) Indication: atrial fibrillation, stroke;s/p IR re-vascularization of R ICA/MCA   Allergies  Allergen Reactions   Codeine    Hydrocodone       Vital Signs: Temp: 97.8 F (36.6 C) (02/25 2216) Temp Source: Oral (02/26 0030) BP: 104/71 (02/26 0200) Pulse Rate: 93 (02/26 0200)  Labs: Recent Labs    11/12/21 2115  HGB 15.3*  HCT 43.6  PLT 210  APTT 37*  LABPROT 14.2  INR 1.1  CREATININE 0.58    CrCl cannot be calculated (Unknown ideal weight.).   Medical History: No past medical history on file.   Assessment: 51 y/o F transfer from [facility] with stroke s/p re-vascularization with IR. Pt is on Apxiaban PTA. Unknown last Apixaban dose, likely 2/25 AM, will start heparin now with no boluses and low goal. Anticipate using aPTT to dose for now. Baseline CBC/renal function good.   Goal of Therapy:  Heparin level 0.3-0.5 units/ml aPTT 66-84 secs Monitor platelets by anticoagulation protocol: Yes   Plan:  No bolus Start heparin drip at 800 units/hr 1200 heparin level, aPTT Daily CBC/heparin level/aPTT Monitor for bleeding  Narda Bonds, PharmD, BCPS Clinical Pharmacist Phone: [phone]

## 2021-11-13 NOTE — Consult Note (Signed)
Cardiology Consultation:   Patient ID: Hannah Zimmerman MRN: [id number]; DOB: 1971/06/30  Admit date: 11/12/2021 Date of Consult: 11/13/2021  PCP:  No primary care provider on file.   [facility] Providers Cardiologist:  Golan/Camnitz Patient Profile:   Hannah Zimmerman is a 51 y.o. female with a hx of atrial fib s/p ablation who is being seen 11/13/2021 for the evaluation of atrial fib after a stroke at the request of Dr. Renella Cunas.  History of Present Illness:   Hannah Zimmerman is a pleasant 51 yo woman with a h/o HCM, persistent atrial fib and ongoing tobacco abuse who underwent catheter ablation of atrial fib in 4/21 and 11/21. She had recurrent atrial fib and has been treated with flecainide. She underwent TEE guided DCCV on 11/08/21. She was in her usual state of health until last night when she developed sudden slurring of her speech. She presented to [facility] and underwent emergent PCI of the right ICA and MCA. She has had a nice recovery. She has had PAF since her admit. She feels her palpitations. Of note she has severe LA enlargement and 15% LGE by cardiac MRI. She denies syncope. The patient denies missing any doses of her Adams.   History reviewed. No pertinent past medical history.     Home Medications:  Prior to Admission medications   Medication Sig Start Date End Date Taking? Authorizing Provider  acetaminophen (TYLENOL) 500 MG tablet Take 500 mg by mouth every 12 (twelve) hours as needed for mild pain or headache.   Yes [provider]  apixaban (ELIQUIS) 5 MG TABS tablet Take 5 mg by mouth 2 (two) times daily.   Yes [provider]  flecainide (TAMBOCOR) 100 MG tablet Take 100 mg by mouth 2 (two) times daily.   Yes [provider]  metoprolol tartrate (LOPRESSOR) 25 MG tablet Take 25 mg by mouth 2 (two) times daily.   Yes [provider]  NON FORMULARY Take 1 each by mouth daily. Vitamin D + B12 gummy   Yes [provider]     Inpatient Medications: Scheduled Meds:   stroke: mapping our early stages of recovery book   Does not apply Once   Chlorhexidine Gluconate Cloth  6 each Topical Q0600   flecainide  100 mg Oral Q12H   mouth rinse  15 mL Mouth Rinse BID   midodrine  10 mg Oral TID WC   nicotine  21 mg Transdermal Daily   Continuous Infusions:  sodium chloride Stopped (11/13/21 1130)   sodium chloride 75 mL/hr at 11/13/21 0244   clevidipine     heparin Stopped (11/13/21 1148)   phenylephrine (NEO-SYNEPHRINE) Adult infusion Stopped (11/13/21 1149)   PRN Meds: acetaminophen **OR** acetaminophen (TYLENOL) oral liquid 160 mg/5 mL **OR** acetaminophen, senna-docusate  Allergies:    Allergies  Allergen Reactions   Codeine    Hydrocodone     Social History:   Social History   Socioeconomic History   Marital status: Not on file    Spouse name: Not on file   Number of children: Not on file   Years of education: Not on file   Highest education level: Not on file  Occupational History   Not on file  Tobacco Use   Smoking status: Every Day    Types: Cigarettes   Smokeless tobacco: Not on file  Vaping Use   Vaping Use: Never used  Substance and Sexual Activity   Alcohol use: Not on file   Drug use: Not on file  Sexual activity: Not on file  Other Topics Concern   Not on file  Social History Narrative   Not on file   Social Determinants of Health   Financial Resource Strain: Not on file  Food Insecurity: Not on file  Transportation Needs: Not on file  Physical Activity: Not on file  Stress: Not on file  Social Connections: Not on file  Intimate Partner Violence: Not on file    Family History:   History reviewed. No pertinent family history.   ROS:  Please see the history of present illness.   All other ROS reviewed and negative.     Physical Exam/Data:   Vitals:   11/13/21 1130 11/13/21 1148 11/13/21 1200 11/13/21 1245  BP:   127/79   Pulse: (!) 50   (!) 48  Resp: 17    18  Temp:  97.8 F (36.6 C)    TempSrc:  Oral    SpO2: 97%   97%    Intake/Output Summary (Last 24 hours) at 11/13/2021 1312 Last data filed at 11/13/2021 1200 Gross per 24 hour  Intake 2863.29 ml  Output 1100 ml  Net 1763.29 ml   Last 3 Weights 11/12/2021  Weight (lbs) 147 lb 11.3 oz  Weight (kg) 67 kg     There is no height or weight on file to calculate BMI.  General:  Well nourished, well developed, in no acute distress HEENT: normal Neck: no JVD Vascular: No carotid bruits; Distal pulses 2+ bilaterally Cardiac:  normal S1, S2; RRR; 1/6 systolic murmur  Lungs:  clear to auscultation bilaterally, no wheezing, rhonchi or rales  Abd: soft, nontender, no hepatomegaly  Ext: no edema Musculoskeletal:  No deformities, BUE and BLE strength normal and equal Skin: warm and dry  Neuro:  right facial droop Psych:  Normal affect   EKG:  The EKG was personally reviewed and demonstrates:  atrial fib with a RVR and a second demonstrating NSR Telemetry:  Telemetry was personally reviewed and demonstrates:  NSR  Relevant CV Studies: 2D echo is pending  Laboratory Data:  High Sensitivity Troponin:  No results for input(s): TROPONINIHS in the last 720 hours.   Chemistry Recent Labs  Lab 11/12/21 2115 11/13/21 0600  NA 139 139  K 2.7* 3.7  CL 101 108  CO2 26 24  GLUCOSE 118* 99  BUN 9 6  CREATININE 0.58 0.47  CALCIUM 8.9 8.0*  GFRNONAA >60 >60  ANIONGAP 12 7    Recent Labs  Lab 11/12/21 2115  PROT 6.2*  ALBUMIN 3.6  AST 28  ALT 51*  ALKPHOS 86  BILITOT 0.7   Lipids  Recent Labs  Lab 11/13/21 0600  CHOL 137  TRIG 45  HDL 41  LDLCALC 87  CHOLHDL 3.3    Hematology Recent Labs  Lab 11/12/21 2115 11/13/21 0600  WBC 6.7 8.3  RBC 4.38 3.92  HGB 15.3* 13.8  HCT 43.6 39.8  MCV 99.5 101.5*  MCH 34.9* 35.2*  MCHC 35.1 34.7  RDW 13.0 13.1  PLT 210 228   Thyroid No results for input(s): TSH, FREET4 in the last 168 hours.  BNPNo results for input(s): BNP,  PROBNP in the last 168 hours.  DDimer No results for input(s): DDIMER in the last 168 hours.   Radiology/Studies:  MR BRAIN WO CONTRAST  Result Date: 11/13/2021 CLINICAL DATA:  Stroke follow-up EXAM: MRI HEAD WITHOUT CONTRAST TECHNIQUE: Multiplanar, multiecho pulse sequences of the brain and surrounding structures were obtained without intravenous contrast. COMPARISON:  CTA from yesterday FINDINGS: Brain: Restricted diffusion at the right striatum with mild petechial hemorrhage. Borderline increased diffusion signal at the adjacent right frontal and insular cortex, but no swelling or hypointensity visualized on the ADC map. No hydrocephalus, mass, or collection. Vascular: Normal flow voids Skull and upper cervical spine: Normal marrow signal Sinuses/Orbits: Negative Other: Intermittent motion artifact. 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 Electronically Signed   By: Jorje Guild M.D.   On: 11/13/2021 12:58   CT HEAD CODE STROKE WO CONTRAST  Result Date: 11/12/2021 CLINICAL DATA:  Code stroke.  Left-sided weakness EXAM: CT HEAD WITHOUT CONTRAST TECHNIQUE: Contiguous axial images were obtained from the base of the skull through the vertex without intravenous contrast. RADIATION DOSE REDUCTION: This exam was performed according to the departmental dose-optimization program which includes automated exposure control, adjustment of the mA and/or kV according to patient size and/or use of iterative reconstruction technique. COMPARISON:  None. FINDINGS: Brain: No definite acute cortical hypodensity. No hemorrhage, mass, mass effect, or midline shift. No hydrocephalus or extra-axial collection. Vascular: Dense right MCA (series 3, image 10), concerning for thrombosis. Skull: Normal. Negative for fracture or focal lesion. Sinuses/Orbits: Mucosal thickening in the ethmoid air cells. Otherwise negative. Other: The mastoids are well aerated. ASPECTS [facility] Stroke Program  Early CT Score) - Ganglionic level infarction (caudate, lentiform nuclei, internal capsule, insula, M1-M3 cortex): 7 - Supraganglionic infarction (M4-M6 cortex): 3 Total score (0-10 with 10 being normal): 9 IMPRESSION: 1. Dense right MCA, concerning for thrombosis, without definite cortical hypodensity. 2. ASPECTS is 10 Code stroke imaging results were communicated on 11/12/2021 at 9:37 pm to provider [facility] via telephone, who verbally acknowledged these results. Electronically Signed   By: Merilyn Baba M.D.   On: 11/12/2021 21:37   CT ANGIO HEAD NECK W WO CM W PERF (CODE STROKE)  Addendum Date: 11/12/2021   ADDENDUM REPORT: 11/12/2021 22:44 ADDENDUM: CT PERFUSION CBF<30%: 67 ml Tmax>6.0s: 180 Mismatch volume: 113 ml Mismatch ratio: 2.7 Infarct location: Right MCA territory Electronically Signed   By: Merilyn Baba M.D.   On: 11/12/2021 22:44   Result Date: 11/12/2021 CLINICAL DATA:  Left-sided weakness, slurred speech EXAM: CT ANGIOGRAPHY HEAD AND NECK TECHNIQUE: Multidetector CT imaging of the head and neck was performed using the standard protocol during bolus administration of intravenous contrast. Multiplanar CT image reconstructions and MIPs were obtained to evaluate the vascular anatomy. Carotid stenosis measurements (when applicable) are obtained utilizing NASCET criteria, using the distal internal carotid diameter as the denominator. RADIATION DOSE REDUCTION: This exam was performed according to the departmental dose-optimization program which includes automated exposure control, adjustment of the mA and/or kV according to patient size and/or use of iterative reconstruction technique. CONTRAST:  181mL OMNIPAQUE IOHEXOL 350 MG/ML SOLN COMPARISON:  No prior CTA, correlation is made with CT head 11/12/2021. FINDINGS: CT HEAD FINDINGS For noncontrast findings, please see same day CT head. CTA NECK FINDINGS Aortic arch: Standard branching. Imaged portion shows no evidence of aneurysm or dissection. No  significant stenosis of the major arch vessel origins. Right carotid system: Complete non opacification of the right internal carotid artery in the proximal to mid right ICA (series 7, image 155). The ICA remains non-opacified through its extracranial and intracranial portions. Left carotid system: No evidence of dissection, stenosis (50% or greater) or occlusion. Vertebral arteries: Codominant. No evidence of dissection, stenosis (50% or greater) or occlusion. Skeleton: No acute osseous abnormality. Degenerative changes in the cervical spine. Other neck: Negative. Upper  chest: No focal pulmonary opacity or pleural effusion. Review of the MIP images confirms the above findings CTA HEAD Anterior circulation: Complete non opacification of the right ICA to the terminus. The left ICA is patent, without significant stenosis. A1 segments patent, with opacification of the right A1 secondary to collateral flow from the left A1. Normal anterior communicating artery. Anterior cerebral arteries are patent to their distal aspects. Non opacification of the right MCA from the terminus through the M1 segment and bifurcation, there is some distal reconstitution, with contrast noted in a right M2 and some M3 branches (series 7, image). No left M1 stenosis or occlusion. Normal left MCA bifurcation. Distal left MCA branches are perfused. Posterior circulation: Vertebral arteries patent to the vertebrobasilar junction without stenosis. Posterior inferior cerebral arteries patent bilaterally. Basilar patent to its distal aspect. Superior cerebellar arteries patent bilaterally. Patent P1 segments. PCAs perfused to their distal aspects without stenosis. The bilateral posterior communicating arteries are not visualized. Venous sinuses: As permitted by contrast timing, patent. Anatomic variants: None significant. Review of the MIP images confirms the above findings IMPRESSION: 1. Complete non opacification of the proximal to mid right ICA,  which extends through the intracranial portions to the terminus. The right MCA does not opacify proximally, with some contrast in the distal MCA vessels. This is consistent with a dense MCA sign seen on the same-day head CT. No other large vessel occlusion or significant stenosis. 2. No hemodynamically significant stenosis in the vertebral arteries, left carotid system, or proximal right carotid system. Code stroke imaging results were communicated on 11/12/2021 at 10:18 pm to provider [facility] via telephone, who verbally acknowledged these results. Electronically Signed: By: Merilyn Baba M.D. On: 11/12/2021 22:24     Assessment and Plan:   Acute right ICA/MCA stroke s/p PCI - the patient appears much improved though she has a residual facial droop.  Paroxysmal and persistent atrial fib - She has had recurrent atrial fib despite 2 ablation and flecainide. I will discuss with Dr. Curt Bears. I think either dofetilide or amiodarone are likely going to be needed. Despite her young age, her marked LA enlargement probably makes amiodarone the best choice. HCM - she appears fairly stable with this as long as she is in NSR.    For questions or updates, please contact [place] Please consult [email or web address] for contact info under    Signed, Cristopher Peru, MD  11/13/2021 1:12 PM

## 2021-11-13 NOTE — Evaluation (Signed)
Physical Therapy Evaluation Patient Details Name: Hannah Zimmerman MRN: [id number] DOB: 15-Nov-1970 Today's Date: 11/13/2021  History of Present Illness  51 y.o. female presents to [facility] hospital on 11/12/2021 as a transfer from [facility] with sudden onset lethargy, L weakness, slurred speech, L neglect.CTA head and neck demonstrate occluded R ICA and MCA. Pt underwent thrombectomy on 2/25. PMH includes HFpEF, hypertrophic cardiomyopathy, Afib.  Clinical Impression  Pt presents to PT with deficits in functional mobility, gait, balance, attention, endurance, strength. Pt with leftward drift when ambulating and no ability to correct despite PT verbal and tactile cues. Pt also demonstrates LLE weakness with L foot dragging during gait. PT also notes left inattention during session. Pt will benefit from aggressive mobilization in an effort to improve L sided strength and balance.      Recommendations for follow up therapy are one component of a multi-disciplinary discharge planning process, led by the attending physician.  Recommendations may be updated based on patient status, additional functional criteria and insurance authorization.  Follow Up Recommendations Acute inpatient rehab (3hours/day)    Assistance Recommended at Discharge Frequent or constant Supervision/Assistance  Patient can return home with the following  A little help with walking and/or transfers;A little help with bathing/dressing/bathroom;Assistance with cooking/housework;Assist for transportation;Help with stairs or ramp for entrance;Direct supervision/assist for medications management;Direct supervision/assist for financial management    Equipment Recommendations  (TBD)  Recommendations for Other Services  Rehab consult    Functional Status Assessment Patient has had a recent decline in their functional status and demonstrates the ability to make significant improvements in function in a reasonable and predictable amount  of time.     Precautions / Restrictions Precautions Precautions: Fall Restrictions Weight Bearing Restrictions: No      Mobility  Bed Mobility Overal bed mobility: Needs Assistance Bed Mobility: Supine to Sit     Supine to sit: Supervision, HOB elevated          Transfers Overall transfer level: Needs assistance Equipment used: 1 person hand held assist Transfers: Sit to/from Stand, Bed to chair/wheelchair/BSC Sit to Stand: Min guard   Step pivot transfers: Min guard            Ambulation/Gait Ambulation/Gait assistance: Min assist Gait Distance (Feet): 100 Feet Assistive device: None Gait Pattern/deviations: Step-through pattern, Drifts right/left Gait velocity: reduced Gait velocity interpretation: 1.31 - 2.62 ft/sec, indicative of limited community ambulator   General Gait Details: pt with consistent leftward drift. Pt is unable to correct leftward drift despite PT verbal cues and instruction to attempt to stay in middle of hall  Stairs            Wheelchair Mobility    Modified Rankin (Stroke Patients Only) Modified Rankin (Stroke Patients Only) Pre-Morbid Rankin Score: No symptoms Modified Rankin: Moderately severe disability     Balance Overall balance assessment: Needs assistance Sitting-balance support: No upper extremity supported, Feet supported Sitting balance-Leahy Scale: Poor     Standing balance support: No upper extremity supported Standing balance-Leahy Scale: Fair                               Pertinent Vitals/Pain Pain Assessment Pain Assessment: No/denies pain Faces Pain Scale: No hurt    Home Living Family/patient expects to be discharged to:: Private residence Living Arrangements: Spouse/significant other (fiance) Available Help at Discharge: Family;Other (Comment) Type of Home: House Home Access: Stairs to enter Entrance Stairs-Rails: Left Entrance Stairs-Number of Steps:  2   Home Layout: One  level Home Equipment: None      Prior Function Prior Level of Function : Independent/Modified Independent;Working/employed;Driving                     Hand Dominance   Dominant Hand: Right    Extremity/Trunk Assessment   Upper Extremity Assessment Upper Extremity Assessment: Defer to OT evaluation    Lower Extremity Assessment Lower Extremity Assessment: LLE deficits/detail LLE Deficits / Details: grossly 4-/5 LLE    Cervical / Trunk Assessment Cervical / Trunk Assessment: Normal  Communication   Communication: No difficulties  Cognition Arousal/Alertness: Awake/alert Behavior During Therapy: WFL for tasks assessed/performed Overall Cognitive Status: Impaired/Different from baseline Area of Impairment: Problem solving, Awareness, Safety/judgement                         Safety/Judgement: Decreased awareness of deficits, Decreased awareness of safety Awareness: Emergent Problem Solving: Slow processing          General Comments General comments (skin integrity, edema, etc.): VSS on RA, pt with left inattention during gait    Exercises     Assessment/Plan    PT Assessment Patient needs continued PT services  PT Problem List Decreased strength;Decreased activity tolerance;Decreased balance;Decreased mobility;Decreased cognition;Decreased knowledge of use of DME;Decreased safety awareness;Decreased knowledge of precautions       PT Treatment Interventions DME instruction;Gait training;Stair training;Functional mobility training;Therapeutic activities;Therapeutic exercise;Balance training;Neuromuscular re-education;Cognitive remediation;Patient/family education    PT Goals (Current goals can be found in the Care Plan section)  Acute Rehab PT Goals Patient Stated Goal: to return to independence PT Goal Formulation: With patient Time For Goal Achievement: 11/27/21 Potential to Achieve Goals: Good Additional Goals Additional Goal #1: Pt will  score >19/24 on the DGI to indicate a reduced risk for falls    Frequency Min 4X/week     Co-evaluation               AM-PAC PT "6 Clicks" Mobility  Outcome Measure Help needed turning from your back to your side while in a flat bed without using bedrails?: A Little Help needed moving from lying on your back to sitting on the side of a flat bed without using bedrails?: A Little Help needed moving to and from a bed to a chair (including a wheelchair)?: A Little Help needed standing up from a chair using your arms (e.g., wheelchair or bedside chair)?: A Little Help needed to walk in hospital room?: A Little Help needed climbing 3-5 steps with a railing? : Total 6 Click Score: 16    End of Session Equipment Utilized During Treatment: Gait belt Activity Tolerance: Patient tolerated treatment well Patient left: in chair;with call bell/phone within reach Nurse Communication: Mobility status PT Visit Diagnosis: Muscle weakness (generalized) (M62.81);Other symptoms and signs involving the nervous system (R29.898);Other abnormalities of gait and mobility (R26.89)    Time: 5573-2202 PT Time Calculation (min) (ACUTE ONLY): 19 min   Charges:   PT Evaluation $PT Eval Low Complexity: 1 Low          Zenaida Niece, PT, DPT Acute Rehabilitation Pager: [phone] Office [phone]   Zenaida Niece 11/13/2021, 4:05 PM

## 2021-11-13 NOTE — H&P (Signed)
H&P Addendum, pre-TEE and pre-cardioversion  Patient was seen and evaluated prior to -cardioversion procedure Symptoms, prior testing details again confirmed with the patient Patient examined, no significant change from prior exam Lab work reviewed in detail personally by myself Patient understands risk and benefit of the procedure,  The risks (stroke, cardiac arrhythmias rarely resulting in the need for a temporary or permanent pacemaker, skin irritation or burns and complications associated with conscious sedation including aspiration, arrhythmia, respiratory failure and death), benefits (restoration of normal sinus rhythm) and alternatives of a direct current cardioversion were explained in detail Patient willing to proceed.  Signed, Esmond Plants, MD, Ph.D [facility]

## 2021-11-13 NOTE — Progress Notes (Signed)
Inpatient Rehab Admissions Coordinator Note:   Per PT/OT patient was screened for CIR candidacy by [facility] Danford Bad, CCC-SLP. At this time, pt appears to be a potential candidate for CIR. I will place an order for rehab consult for full assessment, per our protocol.  Please contact me any with questions.Gayland Curry, [place], [place] Admissions Coordinator [phone] 11/13/21 6:07 PM

## 2021-11-13 NOTE — Transfer of Care (Signed)
Immediate Anesthesia Transfer of Care Note  Patient: Hannah Zimmerman  Procedure(s) Performed: IR WITH ANESTHESIA  Patient Location: ICU  Anesthesia Type:General  Level of Consciousness: awake, alert  and oriented  Airway & Oxygen Therapy: Patient Spontanous Breathing  Post-op Assessment: Report given to RN and Post -op Vital signs reviewed and stable  Post vital signs: Reviewed and stable  Last Vitals:  Vitals Value Taken Time  BP 87/69 11/13/21 0050  Temp    Pulse 139 11/13/21 0054  Resp 20 11/13/21 0054  SpO2 93 % 11/13/21 0054  Vitals shown include unvalidated device data.  Last Pain:  Vitals:   11/13/21 0030  TempSrc: Oral         Complications: No notable events documented.

## 2021-11-13 NOTE — Progress Notes (Signed)
ANTICOAGULATION CONSULT NOTE - Initial Consult  Pharmacy Consult for Heparin   Indication: atrial fibrillation, stroke;s/p IR re-vascularization of R ICA/MCA   Allergies  Allergen Reactions   Codeine    Hydrocodone       Vital Signs: Temp: 97.8 F (36.6 C) (02/26 1148) Temp Source: Oral (02/26 1148) BP: 127/79 (02/26 1200) Pulse Rate: 48 (02/26 1245)  Labs: Recent Labs    11/12/21 2115 11/13/21 0600 11/13/21 1129  HGB 15.3* 13.8  --   HCT 43.6 39.8  --   PLT 210 228  --   APTT 37*  --  34  LABPROT 14.2  --   --   INR 1.1  --   --   HEPARINUNFRC  --   --  >1.10*  CREATININE 0.58 0.47  --     CrCl cannot be calculated (Unknown ideal weight.).     Assessment: 51 y/o F transfer from [facility] with stroke s/p re-vascularization with IR. Pt is on Apxiaban PTA, last dose 2/25 PM. Pharmacy consulted for heparin.  APTT 34 is subtherapeutic on 800 units/hr. Heparin charted at 0 but per RN is because she went to MRI and heparin was switched to a different pump. No significant stop in heparin infusion.    Goal of Therapy:  Heparin level 0.3-0.5 units/ml aPTT 66-84 secs Monitor platelets by anticoagulation protocol: Yes   Plan:  Increase heparin to 1000 units/hr F/u 8hr aPTT F/u aPTT until correlates with heparin level  Daily CBC/heparin level/aPTT Monitor for bleeding  Benetta Spar, PharmD, BCPS, BCCP Clinical Pharmacist  Please check AMION for all [place] phone numbers After 10:00 PM, call [place]

## 2021-11-13 NOTE — Evaluation (Signed)
Occupational Therapy Evaluation Patient Details Name: Hannah Zimmerman MRN: [id number] DOB: December 11, 1970 Today's Date: 11/13/2021   History of Present Illness 51 y.o. female presents to [facility] hospital on 11/12/2021 as a transfer from [place] [facility] with sudden onset lethargy, L weakness, slurred speech, L neglect.CTA head and neck demonstrate occluded R ICA and MCA. Pt underwent thrombectomy on 2/25. PMH includes HFpEF, hypertrophic cardiomyopathy, Afib.   Clinical Impression   PTA, pt was living with her fiance and was independent and working. Pt currently requiring  Mod A for UB and LB ADLs and Min-Mod A for functional mobility with RW. Pt presenting with decreased coordination and strength at LUE as well as decreased balance both in sitting and standing impacting her safe performance of mobility. Pt very motivated. Pt would benefit from further acute OT to facilitate safe dc. Due to patient's age, high motivation, good family support, and PLOF, recommend dc to AIR for further OT to optimize safety, independence with ADLs, and return to PLOF.      Recommendations for follow up therapy are one component of a multi-disciplinary discharge planning process, led by the attending physician.  Recommendations may be updated based on patient status, additional functional criteria and insurance authorization.   Follow Up Recommendations  Acute inpatient rehab (3hours/day)    Assistance Recommended at Discharge Frequent or constant Supervision/Assistance  Patient can return home with the following A little help with walking and/or transfers;A little help with bathing/dressing/bathroom    Functional Status Assessment  Patient has had a recent decline in their functional status and demonstrates the ability to make significant improvements in function in a reasonable and predictable amount of time.  Equipment Recommendations  BSC/3in1    Recommendations for Other Services Rehab consult;PT consult;Speech  consult     Precautions / Restrictions Precautions Precautions: Fall      Mobility Bed Mobility Overal bed mobility: Needs Assistance Bed Mobility: Supine to Sit     Supine to sit: HOB elevated, Min guard     General bed mobility comments: Min Guard A for safety    Transfers Overall transfer level: Needs assistance Equipment used: Rolling walker (2 wheels), None Transfers: Sit to/from Stand Sit to Stand: Min assist           General transfer comment: Min A for correcting L lateral lean. Cues for feet position and weight shifting forward      Balance Overall balance assessment: Needs assistance Sitting-balance support: No upper extremity supported, Feet supported Sitting balance-Leahy Scale: Fair Sitting balance - Comments: left lateral lean during activity; able to correct Postural control: Left lateral lean   Standing balance-Leahy Scale: Poor Standing balance comment: left lateral lean and requiring Min-Mod A for correcting. more assistance required during tasks in standing                           ADL either performed or assessed with clinical judgement   ADL Overall ADL's : Needs assistance/impaired Eating/Feeding: Set up;Sitting   Grooming: Oral care;Minimal assistance;Moderate assistance;Standing Grooming Details (indicate cue type and reason): Initial Min A for standing balance. As pt fatigue and during bilateral coordination, pt requiring Mod A for correcting L lateral lean Upper Body Bathing: Moderate assistance;Sitting   Lower Body Bathing: Moderate assistance;Sit to/from stand   Upper Body Dressing : Moderate assistance;Sitting   Lower Body Dressing: Moderate assistance;Sit to/from stand Lower Body Dressing Details (indicate cue type and reason): Min A for coordination L hand into donning  socks while sitting EOB and using figure four technique. Mod A for standing and correcting left lateral lean Toilet Transfer: Minimal  assistance Toilet Transfer Details (indicate cue type and reason): Min A for power up         Functional mobility during ADLs: Minimal assistance;Moderate assistance General ADL Comments: Pt presenting with decreased coordination, strength, balance, and sequencing. Very motivated     Vision Baseline Vision/History: 1 Wears glasses (reading) Vision Assessment?: Yes Eye Alignment: Within Functional Limits Ocular Range of Motion: Within Functional Limits Alignment/Gaze Preference: Within Defined Limits Tracking/Visual Pursuits: Decreased smoothness of eye movement to LEFT superior field;Decreased smoothness of eye movement to LEFT inferior field Convergence: Within functional limits Diplopia Assessment: Other (comment) (Denies diplopia) Additional Comments: Decreased smooth tracking to left but able to perform with increased time. Denies diplopia or blurry vision     Perception     Praxis      Pertinent Vitals/Pain Pain Assessment Pain Assessment: Faces Faces Pain Scale: No hurt Pain Intervention(s): Monitored during session     Hand Dominance Right   Extremity/Trunk Assessment Upper Extremity Assessment Upper Extremity Assessment: LUE deficits/detail LUE Deficits / Details: Decreased fine and gross motor strength and coorindation. significant time for finger opposition. AROM at shoulder to 80* with compensatory hiking LUE Coordination: decreased fine motor;decreased gross motor   Lower Extremity Assessment Lower Extremity Assessment: Defer to PT evaluation   Cervical / Trunk Assessment Cervical / Trunk Assessment: Normal;Other exceptions Cervical / Trunk Exceptions: Left lateral leaning   Communication Communication Communication: Other (comment) (Increased time)   Cognition Arousal/Alertness: Awake/alert Behavior During Therapy: WFL for tasks assessed/performed Overall Cognitive Status: Impaired/Different from baseline Area of Impairment: Problem solving, Following  commands                       Following Commands: Follows one step commands with increased time     Problem Solving: Slow processing General Comments: Requiring increased time throughout. Very motivated. Able to name three animals that start with letter "c" with significant time     General Comments  VSS on RA. Daughter present throughout    Exercises     Shoulder Instructions      Home Living Family/patient expects to be discharged to:: Private residence Living Arrangements: Spouse/significant other (fiance) Available Help at Discharge: Family;Other (Comment) (PRN but family reporting they will organize more support if needed) Type of Home: House Home Access: Stairs to enter [employer] of Steps: 2 Entrance Stairs-Rails: Left (?) Home Layout: One level     Bathroom Shower/Tub: Occupational psychologist: Standard     Home Equipment: None          Prior Functioning/Environment Prior Level of Function : Independent/Modified Independent;Working/employed;Driving               ADLs Comments: Works an Administrator.        OT Problem List: Decreased strength;Decreased range of motion;Decreased activity tolerance;Impaired balance (sitting and/or standing);Decreased coordination;Decreased cognition;Decreased safety awareness;Decreased knowledge of use of DME or AE;Decreased knowledge of precautions;Impaired UE functional use      OT Treatment/Interventions: Self-care/ADL training;Therapeutic exercise;Energy conservation;DME and/or AE instruction;Therapeutic activities;Patient/family education    OT Goals(Current goals can be found in the care plan section) Acute Rehab OT Goals Patient Stated Goal: Get stronger to return home OT Goal Formulation: With patient Time For Goal Achievement: 11/27/21 Potential to Achieve Goals: Good ADL Goals Pt Will Perform Grooming: with supervision;standing Pt Will Perform Upper  Body Dressing: with  supervision;sitting Pt Will Perform Lower Body Dressing: with min guard assist;sit to/from stand Pt Will Transfer to Toilet: with min guard assist;ambulating;bedside commode Pt Will Perform Toileting - Clothing Manipulation and hygiene: with min guard assist;sit to/from stand;sitting/lateral leans Pt/caregiver will Perform Home Exercise Program: Increased ROM;Increased strength;Left upper extremity;Independently;With written HEP provided (increased coordination)  OT Frequency: Min 3X/week    Co-evaluation              AM-PAC OT "6 Clicks" Daily Activity     Outcome Measure Help from another person eating meals?: None Help from another person taking care of personal grooming?: A Little Help from another person toileting, which includes using toliet, bedpan, or urinal?: A Lot Help from another person bathing (including washing, rinsing, drying)?: A Lot Help from another person to put on and taking off regular upper body clothing?: A Little Help from another person to put on and taking off regular lower body clothing?: A Lot 6 Click Score: 16   End of Session Equipment Utilized During Treatment: Rolling walker (2 wheels);Gait belt Nurse Communication: Mobility status  Activity Tolerance: Patient tolerated treatment well Patient left: in chair;with call bell/phone within reach;with chair alarm set;with family/visitor present  OT Visit Diagnosis: Unsteadiness on feet (R26.81);Other abnormalities of gait and mobility (R26.89);Muscle weakness (generalized) (M62.81)                Time: 4695-0722 OT Time Calculation (min): 31 min Charges:  OT General Charges $OT Visit: 1 Visit OT Evaluation $OT Eval Moderate Complexity: 1 Mod OT Treatments $Self Care/Home Management : 8-22 mins  Brittanyann Wittner MSOT, OTR/L Acute Rehab Pager: [phone] Office: [place] 11/13/2021, 12:56 PM

## 2021-11-13 NOTE — Progress Notes (Addendum)
Patient postprocedure.  Dr. Estanislado Pandy reports to get revascularization.  No stent required. Patient in rapid A-fib with RVR. Requested cardiology consultation-appreciate Dr. Renella Cunas taking a look at the patient. We will order heparin drip stroke protocol. We will follow cardiology commendations once formally put in.   -- Amie Portland, MD Neurologist [facility] Pager: [phone]   Addendum Patient seen by cardiology-ordered esmolol drip for rate control along with phenylephrine that has been required for her hypotension. Appreciate the input-will follow further recommendations as the note becomes available and as the team follows in the AM.  -- Amie Portland, MD Neurologist [facility] Pager: [phone]

## 2021-11-13 NOTE — Progress Notes (Signed)
°  Transition of Care [facility]) Screening Note   Patient Details  Name: Hannah Zimmerman Date of Birth: 1971/06/06   Transition of Care [facility]) CM/SW Contact:    Alfredia Ferguson, LCSW Phone Number: 11/13/2021, 8:15 AM    Transition of Care Department [facility]) has reviewed patient and noted no immediate TOC needs pending continued medical work-up. TOC team will continue to monitor patient advancement through interdisciplinary progression rounds to support any identified discharge supports as needed. If new patient transition needs arise, please place a TOC consult or reach out to [facility] team.

## 2021-11-13 NOTE — Progress Notes (Addendum)
STROKE TEAM PROGRESS NOTE   INTERVAL HISTORY Her daughter is at the bedside. Patient is up walking in the unit with PT. Plan for MRI later today, cardiology consulted for afib management, currently on heparin IV  Vitals:   11/13/21 1558 11/13/21 1600 11/13/21 1615 11/13/21 1630  BP: 114/70 114/70    Pulse: (!) 48 (!) 48 (!) 44 (!) 47  Resp: 19 19 19 19   Temp: 97.8 F (36.6 C)     TempSrc: Axillary     SpO2: 93% 92% 94% 93%   CBC:  Recent Labs  Lab 11/12/21 2115 11/13/21 0600  WBC 6.7 8.3  NEUTROABS 3.6 6.2  HGB 15.3* 13.8  HCT 43.6 39.8  MCV 99.5 101.5*  PLT 210 025   Basic Metabolic Panel:  Recent Labs  Lab 11/12/21 2115 11/13/21 0600  NA 139 139  K 2.7* 3.7  CL 101 108  CO2 26 24  GLUCOSE 118* 99  BUN 9 6  CREATININE 0.58 0.47  CALCIUM 8.9 8.0*   Lipid Panel:  Recent Labs  Lab 11/13/21 0600  CHOL 137  TRIG 45  HDL 41  CHOLHDL 3.3  VLDL 9  LDLCALC 87   HgbA1c:  Recent Labs  Lab 11/13/21 0600  HGBA1C 5.1   Urine Drug Screen: No results for input(s): LABOPIA, COCAINSCRNUR, LABBENZ, AMPHETMU, THCU, LABBARB in the last 168 hours.  Alcohol Level  Recent Labs  Lab 11/12/21 2139  ETH 12*    IMAGING past 24 hours MR BRAIN WO CONTRAST  Result Date: 11/13/2021 CLINICAL DATA:  Stroke follow-up EXAM: MRI HEAD WITHOUT CONTRAST TECHNIQUE: Multiplanar, multiecho pulse sequences of the brain and surrounding structures were obtained without intravenous contrast. COMPARISON:  CTA from yesterday FINDINGS: Brain: Restricted diffusion at the right striatum with mild petechial hemorrhage. Borderline increased diffusion signal at the adjacent right frontal and insular cortex, but no swelling or hypointensity visualized on the ADC map. No hydrocephalus, mass, or collection. Vascular: Normal flow voids Skull and upper cervical spine: Normal marrow signal Sinuses/Orbits: Negative Other: Intermittent motion artifact. 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 Electronically Signed   By: Jorje Guild M.D.   On: 11/13/2021 12:58   CT HEAD CODE STROKE WO CONTRAST  Result Date: 11/12/2021 CLINICAL DATA:  Code stroke.  Left-sided weakness EXAM: CT HEAD WITHOUT CONTRAST TECHNIQUE: Contiguous axial images were obtained from the base of the skull through the vertex without intravenous contrast. RADIATION DOSE REDUCTION: This exam was performed according to the departmental dose-optimization program which includes automated exposure control, adjustment of the mA and/or kV according to patient size and/or use of iterative reconstruction technique. COMPARISON:  None. FINDINGS: Brain: No definite acute cortical hypodensity. No hemorrhage, mass, mass effect, or midline shift. No hydrocephalus or extra-axial collection. Vascular: Dense right MCA (series 3, image 10), concerning for thrombosis. Skull: Normal. Negative for fracture or focal lesion. Sinuses/Orbits: Mucosal thickening in the ethmoid air cells. Otherwise negative. Other: The mastoids are well aerated. ASPECTS [facility] Stroke Program Early CT Score) - Ganglionic level infarction (caudate, lentiform nuclei, internal capsule, insula, M1-M3 cortex): 7 - Supraganglionic infarction (M4-M6 cortex): 3 Total score (0-10 with 10 being normal): 9 IMPRESSION: 1. Dense right MCA, concerning for thrombosis, without definite cortical hypodensity. 2. ASPECTS is 10 Code stroke imaging results were communicated on 11/12/2021 at 9:37 pm to provider [facility] via telephone, who verbally acknowledged these results. Electronically Signed   By: Merilyn Baba M.D.   On: 11/12/2021 21:37   CT  ANGIO HEAD NECK W WO CM W PERF (CODE STROKE)  Addendum Date: 11/12/2021   ADDENDUM REPORT: 11/12/2021 22:44 ADDENDUM: CT PERFUSION CBF<30%: 67 ml Tmax>6.0s: 180 Mismatch volume: 113 ml Mismatch ratio: 2.7 Infarct location: Right MCA territory Electronically Signed   By: Merilyn Baba M.D.   On: 11/12/2021 22:44   Result Date:  11/12/2021 CLINICAL DATA:  Left-sided weakness, slurred speech EXAM: CT ANGIOGRAPHY HEAD AND NECK TECHNIQUE: Multidetector CT imaging of the head and neck was performed using the standard protocol during bolus administration of intravenous contrast. Multiplanar CT image reconstructions and MIPs were obtained to evaluate the vascular anatomy. Carotid stenosis measurements (when applicable) are obtained utilizing NASCET criteria, using the distal internal carotid diameter as the denominator. RADIATION DOSE REDUCTION: This exam was performed according to the departmental dose-optimization program which includes automated exposure control, adjustment of the mA and/or kV according to patient size and/or use of iterative reconstruction technique. CONTRAST:  152mL OMNIPAQUE IOHEXOL 350 MG/ML SOLN COMPARISON:  No prior CTA, correlation is made with CT head 11/12/2021. FINDINGS: CT HEAD FINDINGS For noncontrast findings, please see same day CT head. CTA NECK FINDINGS Aortic arch: Standard branching. Imaged portion shows no evidence of aneurysm or dissection. No significant stenosis of the major arch vessel origins. Right carotid system: Complete non opacification of the right internal carotid artery in the proximal to mid right ICA (series 7, image 155). The ICA remains non-opacified through its extracranial and intracranial portions. Left carotid system: No evidence of dissection, stenosis (50% or greater) or occlusion. Vertebral arteries: Codominant. No evidence of dissection, stenosis (50% or greater) or occlusion. Skeleton: No acute osseous abnormality. Degenerative changes in the cervical spine. Other neck: Negative. Upper chest: No focal pulmonary opacity or pleural effusion. Review of the MIP images confirms the above findings CTA HEAD Anterior circulation: Complete non opacification of the right ICA to the terminus. The left ICA is patent, without significant stenosis. A1 segments patent, with opacification of the  right A1 secondary to collateral flow from the left A1. Normal anterior communicating artery. Anterior cerebral arteries are patent to their distal aspects. Non opacification of the right MCA from the terminus through the M1 segment and bifurcation, there is some distal reconstitution, with contrast noted in a right M2 and some M3 branches (series 7, image). No left M1 stenosis or occlusion. Normal left MCA bifurcation. Distal left MCA branches are perfused. Posterior circulation: Vertebral arteries patent to the vertebrobasilar junction without stenosis. Posterior inferior cerebral arteries patent bilaterally. Basilar patent to its distal aspect. Superior cerebellar arteries patent bilaterally. Patent P1 segments. PCAs perfused to their distal aspects without stenosis. The bilateral posterior communicating arteries are not visualized. Venous sinuses: As permitted by contrast timing, patent. Anatomic variants: None significant. Review of the MIP images confirms the above findings IMPRESSION: 1. Complete non opacification of the proximal to mid right ICA, which extends through the intracranial portions to the terminus. The right MCA does not opacify proximally, with some contrast in the distal MCA vessels. This is consistent with a dense MCA sign seen on the same-day head CT. No other large vessel occlusion or significant stenosis. 2. No hemodynamically significant stenosis in the vertebral arteries, left carotid system, or proximal right carotid system. Code stroke imaging results were communicated on 11/12/2021 at 10:18 pm to provider [facility] via telephone, who verbally acknowledged these results. Electronically Signed: By: Merilyn Baba M.D. On: 11/12/2021 22:24    PHYSICAL EXAM  Physical Exam  Constitutional: Appears well-developed and well-nourished.  Cardiovascular: Normal rate and regular rhythm.  Respiratory: Effort normal, non-labored breathing  Neuro: Mental Status: Patient is awake, alert,  oriented to person, place, month, year, and situation. Patient is able to give a clear and coherent history. No signs of aphasia or neglect Cranial Nerves: II: Visual Fields are full. Pupils are equal, round, and reactive to light.   III,IV, VI: EOMI without ptosis or diploplia.  V: Facial sensation is symmetric to temperature VII:  Left facial droop, left nasolabial fold flattening VIII: Hearing is intact to voice X: Palate elevates symmetrically XI: Shoulder shrug is symmetric. XII: Tongue protrudes midline without atrophy or fasciculations.  Motor: Tone is normal. Bulk is normal. Mild drift noted on the left LUE 4/5  RUE 5/5 LLE 4/5 RLE 5/5 Sensory: Sensation is symmetric to light touch and temperature in the arms and legs. No extinction to DSS present.  Deep Tendon Reflexes: 2+ and symmetric in the biceps and patellae.  Plantars: Toes are downgoing bilaterally.  Cerebellar: FNF and HKS are intact bilaterally Gait- leaning to the left when ambulating    ASSESSMENT/PLAN [name] is a 51 y.o. female with history of atrial fibrillation on eliquis presenting with left hemiplegia, left facial droop left sided numbness, right gaze deviation, dysarthria and left field cut and left hemineglect. Taken to IR due to occluded Rt ICA and Rt MCA. TICI3 revascularization of Rt ICA and Rt MCA.   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 CTA head & neck Complete non opacification of the proximal to mid right ICA, which extends through the intracranial portions to the terminus. The right MCA does not opacify proximally, with some contrast in the distal MCA vessels.  CT perfusion CBF less than 30 of 67 cc and Tmax greater than  6 of 180 cc with a mismatch volume of 130 mL with mismatch ratio 2.7 in the right MCA territory Post IR CT  No ICH or mass effect. 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 2D Echo  LDL 87 HgbA1c 5.1 VTE prophylaxis - IV heparin, SCDs Eliquis (apixaban) daily prior to admission, now on heparin IV.  Therapy recommendations:  pending Disposition:  pending  Atrial Fibrillation- cardiology consulted, appreciate assistance  Previously on eliquis, flecainide, metoprolol  S/p ablation 4/21 and 11/21 TEE guided DCCV on 11/08/21 Cardiology considering dofetilide or amiodarone  severe LA enlargement and 15% LGE by cardiac MRI.  Hypertension Home meds:  lopressor 25mg  BID BP 120-140 Start midodrine to help with hypotension  Hyperlipidemia Home meds:  None LDL 87, goal < 70 Add Atorvastatin 40mg     Other Stroke Risk Factors Cigarette smoker, advised to stop smoking Nicotine patch ordered ETOH use, alcohol level 12, advised to drink no more than 1 drink(s) a day    Hospital day # 1  Patient seen and examined by NP/APP with MD. MD to update note as needed.   Janine Ores, DNP, FNP-BC [facility] Pager: [phone]  ATTENDING ATTESTATION:  51 year old lady with history of atrial fibrillation had acute onset of left-sided hemiplegia slurred speech evaluated by teleneurology out of the window for TNK but on CTA has occluded right ICA/MCA status post successful IR thrombectomy.  Found to be in rapid A-fib  with RVR cardiology consulted and EP cardiology also consulted.  Placed on heparin drip.  Has petechial hemorrhage on MRI.  She has a history of ablation x2 and flecainide with no success.  Cardiology recommends amiodarone.  Exam is fairly benign with left arm leg drift, otherwise alert and oriented and follows commands.  Midodrine 10 mg 3 times daily added for hypotension which is helping on small amount of neo. She is stable in NSR, cardizem  is on hold for now. Discussed with Dr. Lovena Le and ICU nurse.  Dr. Reeves Forth evaluated pt independently, reviewed imaging, chart, labs. Discussed and formulated plan with the APP. Please see APP note above for details.      This patient is critically ill due to acute stroke s/p IR and at significant risk of neurological worsening, death form heart failure, respiratory failure, recurrent stroke, bleeding from [facility], seizure, sepsis. This patient's care requires constant monitoring of vital signs, hemodynamics, respiratory and cardiac monitoring, review of multiple databases, neurological assessment, discussion with family, other specialists and medical decision making of high complexity. I spent 35 minutes of neurocritical care time in the care of this patient.   Abran Gavigan,MD    To contact Stroke Continuity provider, please refer to [email or web address] After hours, contact General Neurology

## 2021-11-13 NOTE — Anesthesia Procedure Notes (Signed)
Procedure Name: Intubation Date/Time: 11/12/2021 10:53 PM Performed by: Suzy Bouchard, CRNA Pre-anesthesia Checklist: Patient identified, Emergency Drugs available, Suction available, Patient being monitored and Timeout performed Patient Re-evaluated:Patient Re-evaluated prior to induction Oxygen Delivery Method: Circle system utilized Preoxygenation: Pre-oxygenation with 100% oxygen Induction Type: Rapid sequence, IV induction and Cricoid Pressure applied Laryngoscope Size: Miller and 2 Grade View: Grade I Tube type: Oral Tube size: 7.5 mm Number of attempts: 1 Airway Equipment and Method: Stylet Placement Confirmation: ETT inserted through vocal cords under direct vision, positive ETCO2 and breath sounds checked- equal and bilateral Secured at: 23 cm Tube secured with: Tape Dental Injury: Teeth and Oropharynx as per pre-operative assessment

## 2021-11-13 NOTE — Progress Notes (Signed)
Referring Physician(s): Amie Portland  Supervising Physician: Luanne Bras  Patient Status:  [facility] - In-pt  Chief Complaint:  Code stroke  Brief History:  Hannah Zimmerman is a 51 yo female with known Afib on Eliquis.  Per chart, she has undergone ablation on 12/24/2019, repeat ablation 08/11/2020 and cardioversion on 11/08/21  She was at home talking to her daughter around 8 pm last night when the daughter notice she acutely lethargic with left sided wkness and slurred speech.   On arrival to ER her NIH stroke scale was 24 = left hemiplegia, left facial droop left sided numbness, right gaze deviation, dysarthria and left field cut and left hemineglect.  She was not a candidate for tPA due to Eliquis.  CTA showed= Complete non opacification of the proximal to mid right ICA, which extends through the intracranial portions to the terminus. The right MCA does not opacify proximally, with some contrast in the distal MCA vessels. This is consistent with a dense MCA sign seen on the same-day head CT. No other large vessel occlusion or significant stenosis. 2. No hemodynamically significant stenosis in the vertebral arteries, left carotid system, or proximal right carotid system.  She was brought to the [facility] suite. Dr. Arlean Hopping procedure note reads= Bilateral common carotid arteriograms. RT CFA approach. Findings. 1.Occluded RT ICA and RT MCA.  2.S/P complete revascularization of of RT ICA and Rt MCA with x1 pass with aspiration and x 1 pass with contact aspiration and 30mm x 45 mm embotrap retriever  achieving a TICI 3 revascularization . Post CT Brain No ICH or mass effect. 30F angioseal used for hemostasis at RT CFA puncture site. Distal pulses all dopplerable .  Extubated. Maintaining airway and O2 sats. Lifts RT arm and Rt leg to command. Beginning to move toes of the Lt foot. No move ment noted of the Lt arm. Pupils 22mm Rt = LT    Subjective:  Sitting up in chair.  Much better today. No trouble swallowing, tolerating a diet.   Allergies: Codeine and Hydrocodone  Medications: Prior to Admission medications   Not on File     Vital Signs: BP 97/69    Pulse (!) 53    Temp 97.8 F (36.6 C) (Oral)    Resp (!) 21    SpO2 96%   Physical Exam Alert, awake, and oriented x3. Speech and comprehension intact. PERRL bilaterally. EOMs intact bilaterally  Visual fields intact bilaterally. No facial asymmetry. Tongue midline. Motor and coordination intact and symmetric. 4/5 Strength bilaterally Common femoral artery puncture site looks good, no bleeding, no hematoma, no pseudoaneurysm Gait not assessed.  Imaging: CT HEAD CODE STROKE WO CONTRAST  Result Date: 11/12/2021 CLINICAL DATA:  Code stroke.  Left-sided weakness EXAM: CT HEAD WITHOUT CONTRAST TECHNIQUE: Contiguous axial images were obtained from the base of the skull through the vertex without intravenous contrast. RADIATION DOSE REDUCTION: This exam was performed according to the departmental dose-optimization program which includes automated exposure control, adjustment of the mA and/or kV according to patient size and/or use of iterative reconstruction technique. COMPARISON:  None. FINDINGS: Brain: No definite acute cortical hypodensity. No hemorrhage, mass, mass effect, or midline shift. No hydrocephalus or extra-axial collection. Vascular: Dense right MCA (series 3, image 10), concerning for thrombosis. Skull: Normal. Negative for fracture or focal lesion. Sinuses/Orbits: Mucosal thickening in the ethmoid air cells. Otherwise negative. Other: The mastoids are well aerated. ASPECTS [facility] Stroke Program Early CT Score) - Ganglionic level infarction (caudate, lentiform nuclei, internal capsule,  insula, M1-M3 cortex): 7 - Supraganglionic infarction (M4-M6 cortex): 3 Total score (0-10 with 10 being normal): 9 IMPRESSION: 1. Dense right MCA, concerning for thrombosis, without definite cortical  hypodensity. 2. ASPECTS is 10 Code stroke imaging results were communicated on 11/12/2021 at 9:37 pm to provider [facility] via telephone, who verbally acknowledged these results. Electronically Signed   By: Merilyn Baba M.D.   On: 11/12/2021 21:37   CT ANGIO HEAD NECK W WO CM W PERF (CODE STROKE)  Addendum Date: 11/12/2021   ADDENDUM REPORT: 11/12/2021 22:44 ADDENDUM: CT PERFUSION CBF<30%: 67 ml Tmax>6.0s: 180 Mismatch volume: 113 ml Mismatch ratio: 2.7 Infarct location: Right MCA territory Electronically Signed   By: Merilyn Baba M.D.   On: 11/12/2021 22:44   Result Date: 11/12/2021 CLINICAL DATA:  Left-sided weakness, slurred speech EXAM: CT ANGIOGRAPHY HEAD AND NECK TECHNIQUE: Multidetector CT imaging of the head and neck was performed using the standard protocol during bolus administration of intravenous contrast. Multiplanar CT image reconstructions and MIPs were obtained to evaluate the vascular anatomy. Carotid stenosis measurements (when applicable) are obtained utilizing NASCET criteria, using the distal internal carotid diameter as the denominator. RADIATION DOSE REDUCTION: This exam was performed according to the departmental dose-optimization program which includes automated exposure control, adjustment of the mA and/or kV according to patient size and/or use of iterative reconstruction technique. CONTRAST:  124mL OMNIPAQUE IOHEXOL 350 MG/ML SOLN COMPARISON:  No prior CTA, correlation is made with CT head 11/12/2021. FINDINGS: CT HEAD FINDINGS For noncontrast findings, please see same day CT head. CTA NECK FINDINGS Aortic arch: Standard branching. Imaged portion shows no evidence of aneurysm or dissection. No significant stenosis of the major arch vessel origins. Right carotid system: Complete non opacification of the right internal carotid artery in the proximal to mid right ICA (series 7, image 155). The ICA remains non-opacified through its extracranial and intracranial portions. Left carotid  system: No evidence of dissection, stenosis (50% or greater) or occlusion. Vertebral arteries: Codominant. No evidence of dissection, stenosis (50% or greater) or occlusion. Skeleton: No acute osseous abnormality. Degenerative changes in the cervical spine. Other neck: Negative. Upper chest: No focal pulmonary opacity or pleural effusion. Review of the MIP images confirms the above findings CTA HEAD Anterior circulation: Complete non opacification of the right ICA to the terminus. The left ICA is patent, without significant stenosis. A1 segments patent, with opacification of the right A1 secondary to collateral flow from the left A1. Normal anterior communicating artery. Anterior cerebral arteries are patent to their distal aspects. Non opacification of the right MCA from the terminus through the M1 segment and bifurcation, there is some distal reconstitution, with contrast noted in a right M2 and some M3 branches (series 7, image). No left M1 stenosis or occlusion. Normal left MCA bifurcation. Distal left MCA branches are perfused. Posterior circulation: Vertebral arteries patent to the vertebrobasilar junction without stenosis. Posterior inferior cerebral arteries patent bilaterally. Basilar patent to its distal aspect. Superior cerebellar arteries patent bilaterally. Patent P1 segments. PCAs perfused to their distal aspects without stenosis. The bilateral posterior communicating arteries are not visualized. Venous sinuses: As permitted by contrast timing, patent. Anatomic variants: None significant. Review of the MIP images confirms the above findings IMPRESSION: 1. Complete non opacification of the proximal to mid right ICA, which extends through the intracranial portions to the terminus. The right MCA does not opacify proximally, with some contrast in the distal MCA vessels. This is consistent with a dense MCA sign seen on the same-day  head CT. No other large vessel occlusion or significant stenosis. 2. No  hemodynamically significant stenosis in the vertebral arteries, left carotid system, or proximal right carotid system. Code stroke imaging results were communicated on 11/12/2021 at 10:18 pm to provider [facility] via telephone, who verbally acknowledged these results. Electronically Signed: By: Merilyn Baba M.D. On: 11/12/2021 22:24    Labs:  CBC: Recent Labs    11/12/21 2115 11/13/21 0600  WBC 6.7 8.3  HGB 15.3* 13.8  HCT 43.6 39.8  PLT 210 228    COAGS: Recent Labs    11/12/21 2115  INR 1.1  APTT 37*    BMP: Recent Labs    11/12/21 2115 11/13/21 0600  NA 139 139  K 2.7* 3.7  CL 101 108  CO2 26 24  GLUCOSE 118* 99  BUN 9 6  CALCIUM 8.9 8.0*  CREATININE 0.58 0.47  GFRNONAA >60 >60    LIVER FUNCTION TESTS: Recent Labs    11/12/21 2115  BILITOT 0.7  AST 28  ALT 51*  ALKPHOS 86  PROT 6.2*  ALBUMIN 3.6    Assessment and Plan:  Occluded RT ICA and RT MCA.   S/P complete revascularization of of RT ICA and Rt MCA by Dr. Estanislado Pandy.  Care per Neurology.  Can remove dressing from Right CFA stick site tomorrow.   Electronically Signed: Murrell Redden, PA-C 11/13/2021, 9:37 AM    I spent a total of 15 Minutes at the the patient's bedside AND on the patient's hospital floor or unit, greater than 50% of which was counseling/coordinating care for f/u cerebral intervention.

## 2021-11-13 NOTE — Consult Note (Signed)
Cardiology Consultation:   Patient ID: Hannah Zimmerman MRN: [id number]; DOB: Dec 17, 1970  Admit date: 11/12/2021 Date of Consult: 11/13/2021  Primary Care Provider: No primary care provider on file. [facility] Cardiologist: None  [facility] Electrophysiologist:  None   Patient Profile:   Hannah Zimmerman is a 51 y.o. female with persistent AF, HFpEF, HCM, morbid obesity s/p gastric bypass, and tobacco abuse who is being seen today for the evaluation of AF at the request of Dr. Rory Percy (neurology).   History of Present Illness:   Hannah Zimmerman (MRN to merge: [id number]) had recent DCCV   Hannah Zimmerman is a 51 y.o. female who is being seen today for the evaluation of AF at the request of No ref. provider found. Presenting today for electrophysiology evaluation.   To review her prior history:  She has heart failure with preserved ejection fraction, morbid obesity status post gastric bypass, persistent atrial fibrillation, and tobacco abuse.  She is status post atrial fibrillation ablation on 12/24/2019 with repeat ablation 08/11/2020.  She had a cardiac MRI which confirmed hypertrophic cardiomyopathy with greater than 15% LGE.  She represented to [facility] with atrial fibrillation.  She stated that she has started to go through menopause and has been having hormone swings which she feels is what started her atrial fibrillation.  She is now status post TEE and cardioversion (11/08/21).   She presented back to the hospital with LUE/LLE motor deficits prompting stroke code and subsequent thrombectomy.    History reviewed. No pertinent past medical history.  Home Medications:  Prior to Admission medications   Not on File   Inpatient Medications: Scheduled Meds:   stroke: mapping our early stages of recovery book   Does not apply Once   Chlorhexidine Gluconate Cloth  6 each Topical Q0600   mouth rinse  15 mL Mouth Rinse BID   nitroGLYCERIN       Continuous Infusions:  sodium  chloride 75 mL/hr at 11/13/21 0800   sodium chloride 75 mL/hr at 11/13/21 0244   ceFAZolin     clevidipine     heparin 800 Units/hr (11/13/21 0800)   phenylephrine (NEO-SYNEPHRINE) Adult infusion 90 mcg/min (11/13/21 0800)   PRN Meds: acetaminophen **OR** acetaminophen (TYLENOL) oral liquid 160 mg/5 mL **OR** acetaminophen, senna-docusate  Allergies:    Allergies  Allergen Reactions   Codeine    Hydrocodone    Social History:   Social History   Socioeconomic History   Marital status: Not on file    Spouse name: Not on file   Number of children: Not on file   Years of education: Not on file   Highest education level: Not on file  Occupational History   Not on file  Tobacco Use   Smoking status: Every Day    Types: Cigarettes   Smokeless tobacco: Not on file  Vaping Use   Vaping Use: Never used  Substance and Sexual Activity   Alcohol use: Not on file   Drug use: Not on file   Sexual activity: Not on file  Other Topics Concern   Not on file  Social History Narrative   Not on file   Social Determinants of Health   Financial Resource Strain: Not on file  Food Insecurity: Not on file  Transportation Needs: Not on file  Physical Activity: Not on file  Stress: Not on file  Social Connections: Not on file  Intimate Partner Violence: Not on file    Family History:   History reviewed. No  pertinent family history.   ROS:  Review of Systems: [y] = yes, [ ]  = no      General: Weight gain [ ] ; Weight loss [ ] ; Anorexia [ ] ; Fatigue [ ] ; Fever [ ] ; Chills [ ] ; Weakness [ ]    Cardiac: Chest pain/pressure [ ] ; Resting SOB [ ] ; Exertional SOB [ ] ; Orthopnea [ ] ; Pedal Edema [ ] ; Palpitations [ ] ; Syncope [ ] ; Presyncope [ ] ; Paroxysmal nocturnal dyspnea [ ]    Pulmonary: Cough [ ] ; Wheezing [ ] ; Hemoptysis [ ] ; Sputum [ ] ; Snoring [ ]    GI: Vomiting [ ] ; Dysphagia [ ] ; Melena [ ] ; Hematochezia [ ] ; Heartburn [ ] ; Abdominal pain [ ] ; Constipation [ ] ; Diarrhea [ ] ; BRBPR [ ]     GU: Hematuria [ ] ; Dysuria [ ] ; Nocturia [ ]  Vascular: Pain in legs with walking [ ] ; Pain in feet with lying flat [ ] ; Non-healing sores [ ] ; Stroke [ ] ; TIA [ ] ; Slurred speech [ ] ;   Neuro: Headaches [ ] ; Vertigo [ ] ; Seizures [ ] ; Paresthesias [ ] ;Blurred vision [ ] ; Diplopia [ ] ; Vision changes [ ] ; Motor impairment [y]   Ortho/Skin: Arthritis [ ] ; Joint pain [ ] ; Muscle pain [ ] ; Joint swelling [ ] ; Back Pain [ ] ; Rash [ ]    Psych: Depression [ ] ; Anxiety [ ]    Heme: Bleeding problems [ ] ; Clotting disorders [ ] ; Anemia [ ]    Endocrine: Diabetes [ ] ; Thyroid dysfunction [ ]    Physical Exam/Data:   Vitals:   11/13/21 0730 11/13/21 0737 11/13/21 0745 11/13/21 0800  BP: 94/73   112/75  Pulse: (!) 57 (!) 55 (!) 54 (!) 51  Resp: 16 18 19 19   Temp:    97.8 F (36.6 C)  TempSrc:    Oral  SpO2: 93% 94% 92% 90%    Intake/Output Summary (Last 24 hours) at 11/13/2021 0824 Last data filed at 11/13/2021 0800 Gross per 24 hour  Intake 2122.99 ml  Output 900 ml  Net 1222.99 ml   Last 3 Weights 11/12/2021  Weight (lbs) 147 lb 11.3 oz  Weight (kg) 67 kg     There is no height or weight on file to calculate BMI.  General:  Well nourished, well developed, in no acute distress HEENT: normal Lymph: no adenopathy Neck: no JVD Endocrine:  No thryomegaly Vascular: No carotid bruits; FA pulses 2+ bilaterally without bruits  Cardiac:  normal S1, S2; RRR; no murmur  Lungs:  clear to auscultation bilaterally, no wheezing, rhonchi or rales  Abd: soft, nontender, no hepatomegaly  Ext: no edema Musculoskeletal:  No deformities, BUE and BLE strength normal and equal Skin: warm and dry  Neuro:  CNs 2-12 intact, 4/5 strength LUE/LLE, 5/5 strength R side Psych:  Normal affect   EKG:  The EKG was personally reviewed and demonstrates: (11/13/21, 00:41:10) SVT with RVR (ventricular rate 147), QRS 162ms, Qtc 560 ms  Telemetry:  Telemetry was personally reviewed and demonstrates: AF/RVR  Relevant CV  Studies:  TEE Result date: 11/08/21  1. Left ventricular ejection fraction, by estimation, is 50-55 %. The  left ventricle has normal function. The left ventricle has no regional  wall motion abnormalities. There is mild concentric left ventricular  hypertrophy.   2. Right ventricular systolic function is normal. The right ventricular  size is normal.   3. Left atrial size was severely dilated. No left atrial/left atrial  appendage thrombus was detected.   4. Right  atrial size was moderately dilated.   5. The mitral valve is normal in structure. No evidence of mitral valve  regurgitation. No evidence of mitral stenosis.   6. Tricuspid valve regurgitation is moderate.   7. The aortic valve is normal in structure. Aortic valve regurgitation is  not visualized. Aortic valve sclerosis is present, with no evidence of  aortic valve stenosis.   8. The inferior vena cava is normal in size with greater than 50%  respiratory variability, suggesting right atrial pressure of 3 mmHg.   9. Agitated saline contrast bubble study was negative, with no evidence  of any interatrial shunt.   cMRI Result date: 01/28/20 1.  Normal LV chamber size, with hyperdynamic function, LVEF 67%. 2.  Normal RV chamber size, RVEF 70%. 3. 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 Maximal wall thickness at the mid ventricular level is 19 mm. 4. Large area of dense delayed myocardial enhancement at the mid ventricular level in the area of maximal wall thickness. Visually appears > 15% of the myocardial mass. 5. Severe left atrial enlargement. Iatrogenic left to right atrial level shunts 6.  No findings to suggests cardiac amyloidosis.  ECV 22%.   TTE Result date: 07/31/19  1. Left ventricular ejection fraction, by visual estimation, is 60 to  65%. The left ventricle has normal function. There is moderately increased  left ventricular hypertrophy.   2. Left  ventricular diastolic parameters are indeterminate.   3. Global right ventricle has normal systolic function.The right  ventricular size is normal. No increase in right ventricular wall  thickness.   4. Left atrial size was severely dilated.   5. Right atrial size was mildly dilated   6. Moderately elevated pulmonary artery systolic pressure.   7. Rhythm is atrial fibrillation.   Laboratory Data:  High Sensitivity Troponin:  No results for input(s): TROPONINIHS in the last 720 hours.   Chemistry Recent Labs  Lab 11/12/21 2115 11/13/21 0600  NA 139 139  K 2.7* 3.7  CL 101 108  CO2 26 24  GLUCOSE 118* 99  BUN 9 6  CREATININE 0.58 0.47  CALCIUM 8.9 8.0*  GFRNONAA >60 >60  ANIONGAP 12 7    Recent Labs  Lab 11/12/21 2115  PROT 6.2*  ALBUMIN 3.6  AST 28  ALT 51*  ALKPHOS 86  BILITOT 0.7   Hematology Recent Labs  Lab 11/12/21 2115 11/13/21 0600  WBC 6.7 8.3  RBC 4.38 3.92  HGB 15.3* 13.8  HCT 43.6 39.8  MCV 99.5 101.5*  MCH 34.9* 35.2*  MCHC 35.1 34.7  RDW 13.0 13.1  PLT 210 228   BNPNo results for input(s): BNP, PROBNP in the last 168 hours.  DDimer No results for input(s): DDIMER in the last 168 hours.  Radiology/Studies:  CT HEAD CODE STROKE WO CONTRAST  Result Date: 11/12/2021 CLINICAL DATA:  Code stroke.  Left-sided weakness EXAM: CT HEAD WITHOUT CONTRAST TECHNIQUE: Contiguous axial images were obtained from the base of the skull through the vertex without intravenous contrast. RADIATION DOSE REDUCTION: This exam was performed according to the departmental dose-optimization program which includes automated exposure control, adjustment of the mA and/or kV according to patient size and/or use of iterative reconstruction technique. COMPARISON:  None. FINDINGS: Brain: No definite acute cortical hypodensity. No hemorrhage, mass, mass effect, or midline shift. No hydrocephalus or extra-axial collection. Vascular: Dense right MCA (series 3, image 10), concerning for  thrombosis. Skull: Normal. Negative for fracture or focal lesion. Sinuses/Orbits: Mucosal thickening  in the ethmoid air cells. Otherwise negative. Other: The mastoids are well aerated. ASPECTS [facility] Stroke Program Early CT Score) - Ganglionic level infarction (caudate, lentiform nuclei, internal capsule, insula, M1-M3 cortex): 7 - Supraganglionic infarction (M4-M6 cortex): 3 Total score (0-10 with 10 being normal): 9 IMPRESSION: 1. Dense right MCA, concerning for thrombosis, without definite cortical hypodensity. 2. ASPECTS is 10 Code stroke imaging results were communicated on 11/12/2021 at 9:37 pm to provider [facility] via telephone, who verbally acknowledged these results. Electronically Signed   By: Merilyn Baba M.D.   On: 11/12/2021 21:37   CT ANGIO HEAD NECK W WO CM W PERF (CODE STROKE)  Addendum Date: 11/12/2021   ADDENDUM REPORT: 11/12/2021 22:44 ADDENDUM: CT PERFUSION CBF<30%: 67 ml Tmax>6.0s: 180 Mismatch volume: 113 ml Mismatch ratio: 2.7 Infarct location: Right MCA territory Electronically Signed   By: Merilyn Baba M.D.   On: 11/12/2021 22:44   Result Date: 11/12/2021 CLINICAL DATA:  Left-sided weakness, slurred speech EXAM: CT ANGIOGRAPHY HEAD AND NECK TECHNIQUE: Multidetector CT imaging of the head and neck was performed using the standard protocol during bolus administration of intravenous contrast. Multiplanar CT image reconstructions and MIPs were obtained to evaluate the vascular anatomy. Carotid stenosis measurements (when applicable) are obtained utilizing NASCET criteria, using the distal internal carotid diameter as the denominator. RADIATION DOSE REDUCTION: This exam was performed according to the departmental dose-optimization program which includes automated exposure control, adjustment of the mA and/or kV according to patient size and/or use of iterative reconstruction technique. CONTRAST:  1100mL OMNIPAQUE IOHEXOL 350 MG/ML SOLN COMPARISON:  No prior CTA, correlation is made with CT  head 11/12/2021. FINDINGS: CT HEAD FINDINGS For noncontrast findings, please see same day CT head. CTA NECK FINDINGS Aortic arch: Standard branching. Imaged portion shows no evidence of aneurysm or dissection. No significant stenosis of the major arch vessel origins. Right carotid system: Complete non opacification of the right internal carotid artery in the proximal to mid right ICA (series 7, image 155). The ICA remains non-opacified through its extracranial and intracranial portions. Left carotid system: No evidence of dissection, stenosis (50% or greater) or occlusion. Vertebral arteries: Codominant. No evidence of dissection, stenosis (50% or greater) or occlusion. Skeleton: No acute osseous abnormality. Degenerative changes in the cervical spine. Other neck: Negative. Upper chest: No focal pulmonary opacity or pleural effusion. Review of the MIP images confirms the above findings CTA HEAD Anterior circulation: Complete non opacification of the right ICA to the terminus. The left ICA is patent, without significant stenosis. A1 segments patent, with opacification of the right A1 secondary to collateral flow from the left A1. Normal anterior communicating artery. Anterior cerebral arteries are patent to their distal aspects. Non opacification of the right MCA from the terminus through the M1 segment and bifurcation, there is some distal reconstitution, with contrast noted in a right M2 and some M3 branches (series 7, image). No left M1 stenosis or occlusion. Normal left MCA bifurcation. Distal left MCA branches are perfused. Posterior circulation: Vertebral arteries patent to the vertebrobasilar junction without stenosis. Posterior inferior cerebral arteries patent bilaterally. Basilar patent to its distal aspect. Superior cerebellar arteries patent bilaterally. Patent P1 segments. PCAs perfused to their distal aspects without stenosis. The bilateral posterior communicating arteries are not visualized. Venous  sinuses: As permitted by contrast timing, patent. Anatomic variants: None significant. Review of the MIP images confirms the above findings IMPRESSION: 1. Complete non opacification of the proximal to mid right ICA, which extends through the intracranial portions to  the terminus. The right MCA does not opacify proximally, with some contrast in the distal MCA vessels. This is consistent with a dense MCA sign seen on the same-day head CT. No other large vessel occlusion or significant stenosis. 2. No hemodynamically significant stenosis in the vertebral arteries, left carotid system, or proximal right carotid system. Code stroke imaging results were communicated on 11/12/2021 at 10:18 pm to provider [facility] via telephone, who verbally acknowledged these results. Electronically Signed: By: Merilyn Baba M.D. On: 11/12/2021 22:24   { Assessment and Plan:   R ICA-R MCA complete occlusion Persistent AF Patient with recent cardioversion and despite negative TEE had extensive thromboembolic stroke.  She has an HCM patient so her stroke risk is higher than predicted by CHA2DS2-VASc.  She was on apixaban 5 mg twice daily with last dose on 02/25 PM at 19:00 which the patient confirmed.  Fortunately she had near complete recovery of motor function during my evaluation post thrombectomy.  She has been started back on low-dose heparin infusion since she is post cardioversion now with thromboembolic stroke while on full dose anticoagulation.  I would favor transitioning to a direct thrombin inhibitor like dabigatran since she had a stroke while on apixaban with a negative TEE.  Additionally she was transitioned from flecainide afternoon however with her significant scar burden on cMRI I would favor an alternative approach to management of her persistent atrial fibrillation and avoidance of class Ic agents but will defer to EP regarding management of her atrial arrhythmias. She was requiring phenylephrine for BP support for  sBP goal 120-140, would use esmolol if rates >120 as she has hemodynamic drop of ~20 mmHg when she was in AF/RVR at rates >120 but doubt her BP would tolerate diltiazem. Amio would be the other short term option until she settles out with regards to rhythm medications. EP to see her this morning.   For questions or updates, please contact [place] Please consult [email or web address] for contact info under   Signed, Dion Body, MD  11/13/2021 8:24 AM

## 2021-11-13 NOTE — Progress Notes (Signed)
ANTICOAGULATION CONSULT NOTE   Pharmacy Consult for Heparin   Indication: atrial fibrillation, stroke;s/p IR re-vascularization of R ICA/MCA   Allergies  Allergen Reactions   Codeine    Hydrocodone       Vital Signs: Temp: 97.8 F (36.6 C) (02/26 1558) Temp Source: Axillary (02/26 1558) BP: 117/83 (02/26 2000) Pulse Rate: 49 (02/26 2000)  Labs: Recent Labs    11/12/21 2115 11/13/21 0600 11/13/21 1129 11/13/21 2000  HGB 15.3* 13.8  --   --   HCT 43.6 39.8  --   --   PLT 210 228  --   --   APTT 37*  --  34 36  LABPROT 14.2  --   --   --   INR 1.1  --   --   --   HEPARINUNFRC  --   --  >1.10*  --   CREATININE 0.58 0.47  --   --      CrCl cannot be calculated (Unknown ideal weight.).     Assessment: 51 y/o F transfer from [facility] with stroke s/p re-vascularization with IR. Pt is on Apxiaban PTA, last dose 2/25 PM. Pharmacy consulted for heparin.  HL >1.1 (affected by apixaban). aPTT remains subtherapeutic (36 sec) on 1000 units/hr. No issues with line or bleeding reported per RN.  Goal of Therapy:  Heparin level 0.3-0.5 units/ml aPTT 66-84 secs Monitor platelets by anticoagulation protocol: Yes   Plan:  Increase heparin to 1200 units/hr F/u 6 hr aPTT  Sherlon Handing, PharmD, BCPS Please see amion for complete clinical pharmacist phone list 11/13/2021 9:41 PM

## 2021-11-14 ENCOUNTER — Other Ambulatory Visit: Payer: Self-pay

## 2021-11-14 ENCOUNTER — Encounter (HOSPITAL_COMMUNITY): Payer: Self-pay | Admitting: Interventional Radiology

## 2021-11-14 ENCOUNTER — Other Ambulatory Visit (HOSPITAL_COMMUNITY): Payer: Self-pay

## 2021-11-14 DIAGNOSIS — I639 Cerebral infarction, unspecified: Secondary | ICD-10-CM | POA: Diagnosis not present

## 2021-11-14 LAB — BASIC METABOLIC PANEL
Anion gap: 7 (ref 5–15)
Anion gap: 8 (ref 5–15)
BUN: 9 mg/dL (ref 6–20)
BUN: 10 mg/dL (ref 6–20)
CO2: 24 mmol/L (ref 22–32)
CO2: 22 mmol/L (ref 22–32)
Calcium: 8.5 mg/dL — ABNORMAL LOW (ref 8.9–10.3)
Calcium: 8.3 mg/dL — ABNORMAL LOW (ref 8.9–10.3)
Chloride: 110 mmol/L (ref 98–111)
Chloride: 109 mmol/L (ref 98–111)
Creatinine, Ser: 0.7 mg/dL (ref 0.44–1.00)
Creatinine, Ser: 0.66 mg/dL (ref 0.44–1.00)
GFR, Estimated: >60 mL/min (ref >60)
GFR, Estimated: >60 mL/min (ref >60)
Glucose, Bld: 96 mg/dL (ref 70–99)
Glucose, Bld: 81 mg/dL (ref 70–99)
Potassium: 3.6 mmol/L (ref 3.5–5.1)
Potassium: 3.9 mmol/L (ref 3.5–5.1)
Sodium: 141 mmol/L (ref 135–145)
Sodium: 139 mmol/L (ref 135–145)

## 2021-11-14 LAB — CBC
HCT: 38 % (ref 36.0–46.0)
Hemoglobin: 13 g/dL (ref 12.0–15.0)
MCH: 35.3 pg — ABNORMAL HIGH (ref 26.0–34.0)
MCHC: 34.2 g/dL (ref 30.0–36.0)
MCV: 103.3 fL — ABNORMAL HIGH (ref 80.0–100.0)
Platelets: 205 10*3/uL (ref 150–400)
RBC: 3.68 MIL/uL — ABNORMAL LOW (ref 3.87–5.11)
RDW: 13.2 % (ref 11.5–15.5)
WBC: 7.7 10*3/uL (ref 4.0–10.5)
nRBC: 0 % (ref 0.0–0.2)

## 2021-11-14 LAB — APTT
aPTT: 67 seconds — ABNORMAL HIGH (ref 24–36)
aPTT: 47 seconds — ABNORMAL HIGH (ref 24–36)

## 2021-11-14 LAB — MAGNESIUM: Magnesium: 1.8 mg/dL (ref 1.7–2.4)

## 2021-11-14 LAB — HEPARIN LEVEL (UNFRACTIONATED): Heparin Unfractionated: >1.1 IU/mL — ABNORMAL HIGH (ref 0.30–0.70)

## 2021-11-14 MED ORDER — POTASSIUM CHLORIDE CRYS ER 20 MEQ PO TBCR
60.0000 meq | EXTENDED_RELEASE_TABLET | Freq: Once | ORAL | Status: AC
  Administered 2021-11-14: 60 meq via ORAL
  Filled 2021-11-14: qty 3

## 2021-11-14 MED ORDER — MIDODRINE HCL 5 MG PO TABS
15.0000 mg | ORAL_TABLET | Freq: Three times a day (TID) | ORAL | Status: DC
  Administered 2021-11-14 – 2021-11-15 (×4): 15 mg via ORAL
  Filled 2021-11-14 (×4): qty 3

## 2021-11-14 MED ORDER — MAGNESIUM SULFATE 2 GM/50ML IV SOLN
2.0000 g | Freq: Once | INTRAVENOUS | Status: AC
  Administered 2021-11-14: 2 g via INTRAVENOUS
  Filled 2021-11-14: qty 50

## 2021-11-14 MED ORDER — MIDODRINE HCL 5 MG PO TABS: 15.0000 mg | ORAL_TABLET | Freq: Three times a day (TID) | ORAL | Status: DC

## 2021-11-14 NOTE — Evaluation (Signed)
Speech Language Pathology Evaluation Patient Details Name: Hannah Zimmerman MRN: [id number] DOB: [birth year] Today's Date: 11/14/2021 Time: 4332-9518 SLP Time Calculation (min) (ACUTE ONLY): 14 min  Problem List:  Patient Active Problem List   Diagnosis Date Noted   Middle cerebral artery embolism, 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   Acute ischemic stroke ([place]) 11/12/2021   Past Medical History: History reviewed. No pertinent past medical history. Past Surgical History:  Past Surgical History:  Procedure Laterality Date   RADIOLOGY WITH ANESTHESIA N/A 11/12/2021   Procedure: IR WITH ANESTHESIA;  Surgeon: Luanne Bras, MD;  Location: [facility];  Service: Radiology;  Laterality: N/A;   HPI:  Pt is a 51 y.o. female who presented to [facility] on [facility]/12/2021 as a transfer from [facility] with sudden onset lethargy, L weakness, slurred speech, L neglect. CTA head and neck revealed occluded R ICA and MCA. Pt underwent thrombectomy on 2/25. MRI brain 2/26: Acute infarction at the right striatum with petechial hemorrhage. PMH: HFpEF, hypertrophic cardiomyopathy, Afib.   Assessment / Plan / Recommendation Clinical Impression  Pt participated in speech-language-cognition evaluation with her daughter and fiance present. Pt reported that she completed two years of college and works full time. She denied any baseline deficits or acute changes in the aforementioned areas. Pt's daughter stated that the pt's processing speed appears slower than baseline, but she questioned whether this may have been due to her exhaustion. The [facility] Mental Status Examination was completed to evaluate the pt's cognitive-linguistic skills. She achieved a score of 22/30 which is below the normal limits of 27 or more out of 30 and is suggestive of a mild impairment. She exhibited difficulty in the areas of awareness, attention, memory, and executive function. Skilled SLP services are clinically indicated at this time to improve  cognitive-linguistic function.    SLP Assessment  SLP Recommendation/Assessment: Patient needs continued Speech [facility] SLP Visit Diagnosis: Cognitive communication deficit (R41.841)    Recommendations for follow up therapy are one component of a multi-disciplinary discharge planning process, led by the attending physician.  Recommendations may be updated based on patient status, additional functional criteria and insurance authorization.    Follow Up Recommendations  Acute inpatient rehab (3hours/day)    Assistance Recommended at Discharge     Functional Status Assessment Patient has had a recent decline in their functional status and demonstrates the ability to make significant improvements in function in a reasonable and predictable amount of time.  Frequency and Duration min 2x/week  2 weeks      SLP Evaluation Cognition  Overall Cognitive Status: Impaired/Different from baseline Arousal/Alertness: Awake/alert Orientation Level: Oriented X4 Year: 2023 Month: February Day of Week: Correct Attention: Focused;Sustained Focused Attention: Appears intact Sustained Attention: Impaired Sustained Attention Impairment: Verbal complex Memory: Impaired Memory Impairment:  (Immediate: 5/5; delayed: 4/5; with cue: 1/1) Awareness: Impaired Awareness Impairment: Emergent impairment Problem Solving: Impaired Problem Solving Impairment: Verbal complex (Money & time: 3/3 with additional processing time) Executive Function: Sequencing;Organizing Sequencing: Impaired Sequencing Impairment: Verbal complex (clock drawing: 2/4) Organizing: Appears intact (backward digit span: 2/2)       Comprehension  Auditory Comprehension Overall Auditory Comprehension: Appears within functional limits for tasks assessed Yes/No Questions: Within Functional Limits Commands: Within Functional Limits Conversation: Complex    Expression Expression Primary Mode of Expression:  Verbal Verbal Expression Overall Verbal Expression: Appears within functional limits for tasks assessed Initiation: No impairment Level of Generative/Spontaneous Verbalization: Conversation Repetition: No impairment Naming: No impairment   Oral / Motor  Oral Motor/Sensory Function  Overall Oral Motor/Sensory Function: Mild impairment Facial ROM: Reduced left;Suspected CN VII (facial) dysfunction Facial Symmetry: Abnormal symmetry left Motor Speech Overall Motor Speech: Impaired at baseline Respiration: Within functional limits Phonation: Normal Resonance: Within functional limits Articulation: Impaired Level of Impairment: Conversation Intelligibility: Intelligibility reduced Word: 75-100% accurate Phrase: 75-100% accurate Sentence: 75-100% accurate Conversation: 75-100% accurate Motor Planning: Witnin functional limits           Alexus Galka I. Hardin Negus, [place], [place] Office number [phone] Pager [place] 11/14/2021, 10:56 AM

## 2021-11-14 NOTE — TOC CAGE-AID Note (Signed)
Transition of Care [facility]) - CAGE-AID Screening   Patient Details  Name: Hannah Zimmerman MRN: [id number] Date of Birth: Sep 05, 1971  Transition of [facility]) CM/SW Contact:    Benard Halsted, LCSW Phone Number: 11/14/2021, 12:12 PM   Clinical Narrative: CSW spoke with patient. Daughter remained in room with patient's verbal permission. She reported ETOH use recreationally but denied having any issues with use or feeling like she needed to drink all the time. She also admitted to Cannabis use but denied needing resources for that either. She confirmed living with her fiance and has transportation; she was independent prior to hospitalization and did not use any mobility aides. She is aware of CIR recommendation and is interested in the program. No other needs identified at this time.    CAGE-AID Screening:    Have You Ever Felt You Ought to Cut Down on Your Drinking or Drug Use?: No Have People Annoyed You By Critizing Your Drinking Or Drug Use?: No Have You Felt Bad Or Guilty About Your Drinking Or Drug Use?: No Have You Ever Had a Drink or Used Drugs First Thing In The Morning to Steady Your Nerves or to Get Rid of a Hangover?: No CAGE-AID Score: 0  Substance Abuse Education Offered: Yes

## 2021-11-14 NOTE — Progress Notes (Signed)
Referring Physician(s): Amie Portland, MD  Supervising Physician: Luanne Bras  Patient Status:  [facility] - In-pt  Chief Complaint: Follow up revascularization of right ICA and right MCA 11/12/21  Subjective:  Patient seen in room, laying in bed with significant other. She states she is feeling ok, has been up in the chair/walking/eating without difficulty. She feels weaker on her left side than her right. She is planned to move to regular floor today and is having medicines changed for her heart rate, she is waiting for PT to see her and let her know if she needs go to outpatient rehab.  Allergies: Codeine and Hydrocodone  Medications: Prior to Admission medications   Medication Sig Start Date End Date Taking? Authorizing Provider  acetaminophen (TYLENOL) 500 MG tablet Take 500 mg by mouth every 12 (twelve) hours as needed for mild pain or headache.   Yes [provider]  apixaban (ELIQUIS) 5 MG TABS tablet Take 5 mg by mouth 2 (two) times daily.   Yes [provider]  flecainide (TAMBOCOR) 100 MG tablet Take 100 mg by mouth 2 (two) times daily.   Yes [provider]  metoprolol tartrate (LOPRESSOR) 25 MG tablet Take 25 mg by mouth 2 (two) times daily.   Yes [provider]  NON FORMULARY Take 1 each by mouth daily. Vitamin D + B12 gummy   Yes [provider]     Vital Signs: BP 97/64    Pulse (!) 59    Temp 97.9 F (36.6 C) (Oral)    Resp 18    SpO2 92%   Physical Exam Vitals and nursing note reviewed.  Constitutional:      General: She is not in acute distress. HENT:     Head: Normocephalic.  Cardiovascular:     Rate and Rhythm: Regular rhythm. Bradycardia present.  Pulmonary:     Effort: Pulmonary effort is normal.  Skin:    General: Skin is warm and dry.  Neurological:     Mental Status: She is alert.  Alert, awake, and oriented x 3 Speech and comprehension in tact PER bilaterally EOMs without nystagmus or  subjective diplopia. Visual fields grossly in tact Left sided facial droop Tongue midline  Motor power 5/5 RUE/RLE, 3/5 LUE, 4/5 LLE   Imaging: MR BRAIN WO CONTRAST  Result Date: 11/13/2021 CLINICAL DATA:  Stroke follow-up EXAM: MRI HEAD WITHOUT CONTRAST TECHNIQUE: Multiplanar, multiecho pulse sequences of the brain and surrounding structures were obtained without intravenous contrast. COMPARISON:  CTA from yesterday FINDINGS: Brain: Restricted diffusion at the right striatum with mild petechial hemorrhage. Borderline increased diffusion signal at the adjacent right frontal and insular cortex, but no swelling or hypointensity visualized on the ADC map. No hydrocephalus, mass, or collection. Vascular: Normal flow voids Skull and upper cervical spine: Normal marrow signal Sinuses/Orbits: Negative Other: Intermittent motion artifact. 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 Electronically Signed   By: Jorje Guild M.D.   On: 11/13/2021 12:58   ECHOCARDIOGRAM COMPLETE  Result Date: 11/13/2021    ECHOCARDIOGRAM REPORT   Patient Name:   YAEL COPPESS Date of Exam: 11/13/2021 Medical Rec #:  [id number]      Height: Accession #:    [id number]     Weight:       147.7 lb Date of Birth:  May 06, 1971     BSA:          1.650 m Patient Age:    51 years  BP:           99/75 mmHg Patient Gender: F              HR:           48 bpm. Exam Location:  Inpatient Procedure: 2D Echo, Cardiac Doppler, Color Doppler, Strain Analysis and 3D Echo Indications:    CVA  History:        Patient has no prior history of Echocardiogram examinations.  Sonographer:    Luisa Hart RDCS Referring Phys: [id number] ASHISH ARORA IMPRESSIONS  1. Left ventricular ejection fraction, by estimation, is 60 to 65%. The left ventricle has normal function. The left ventricle has no regional wall motion abnormalities. There is severe concentric left ventricular hypertrophy. Indeterminate diastolic filling due  to E-A fusion. Elevated left ventricular end-diastolic pressure.  2. Right ventricular systolic function is normal. The right ventricular size is moderately enlarged.  3. Right atrial size was moderately dilated.  4. Left atrial size was severely dilated.  5. The mitral valve is normal in structure. Mild mitral valve regurgitation. No evidence of mitral stenosis.  6. There is a soft tissue density on the noncoronary cusp extending into the LVOT. 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 Differential dx includes endocarditis (in right clinical setting) or valvular myxmoa. Images from TEE done 11/08/21 demonstrate the  same soft tissue density (images 34-37 and 42 on TEE). There is also a prominent nodule of Arantius on the [facility] that is normal finding.  7. Recommend cMRI for further tissue evaluation of the aortic valve mass. FINDINGS  Left Ventricle: Left ventricular ejection fraction, by estimation, is 60 to 65%. The left ventricle has normal function. The left ventricle has no regional wall motion abnormalities. Global longitudinal strain performed but not reported based on interpreter judgement due to suboptimal tracking. The left ventricular internal cavity size was normal in size. There is severe concentric left ventricular hypertrophy. Indeterminate diastolic filling due to E-A fusion. Elevated left ventricular end-diastolic pressure. Right Ventricle: The right ventricular size is moderately enlarged. No increase in right ventricular wall thickness. Right ventricular systolic function is normal. Left Atrium: Left atrial size was severely dilated. Right Atrium: Right atrial size was moderately dilated. Pericardium: There is no evidence of pericardial effusion. Mitral Valve: The mitral valve is normal in structure. Mild mitral annular calcification. Mild mitral valve regurgitation. No evidence of mitral valve stenosis. Tricuspid Valve: The tricuspid valve is normal in structure. Tricuspid valve regurgitation is mild  . No evidence of tricuspid stenosis. Aortic Valve: There is a soft tissue density on the noncoronary cusp extending into the LVOT. 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 Differential dx includes endocarditis (in right clinical setting) or valvular myxmoa. Images from TEE done 11/08/21 demonstrate the same soft tissue density (images 34-37 and 42 on TEE). There is also a prominent nodule of Arantius on the [facility] that is normal finding. The aortic valve is tricuspid. Aortic valve regurgitation is not visualized. Aortic valve sclerosis is present, with no evidence of aortic valve stenosis. Aortic valve mean gradient measures 3.0 mmHg. Aortic valve peak gradient measures 6.1 mmHg. Aortic valve area, by VTI measures 2.44 cm. Pulmonic Valve: The pulmonic valve was normal in structure. Pulmonic valve regurgitation is trivial. No evidence of pulmonic stenosis. Aorta: The aortic root is normal in size and structure. Venous: The inferior vena cava was not well visualized. IAS/Shunts: No atrial level shunt detected by color flow Doppler.  LEFT VENTRICLE PLAX 2D LVIDd:  4.30 cm     Diastology LVIDs:         2.05 cm     LV e' medial:    4.33 cm/s LV PW:         1.50 cm     LV E/e' medial:  20.4 LV IVS:        1.40 cm     LV e' lateral:   5.46 cm/s LVOT diam:     2.10 cm     LV E/e' lateral: 16.2 LV SV:         69 LV SV Index:   42 LVOT Area:     3.46 cm  LV Volumes (MOD) LV vol d, MOD A4C: 48.0 ml LV vol s, MOD A4C: 23.8 ml LV SV MOD A4C:     48.0 ml RIGHT VENTRICLE RV Basal diam:  5.50 cm RV Mid diam:    3.20 cm LEFT ATRIUM           Index        RIGHT ATRIUM           Index LA Vol (A2C): 92.9 ml 56.31 ml/m  RA Area:     20.40 cm                                    RA Volume:   63.80 ml  38.67 ml/m  AORTIC VALVE                    PULMONIC VALVE AV Area (Vmax):    2.34 cm     PV Vmax:          0.95 m/s AV Area (Vmean):   2.21 cm     PV Vmean:         61.700 cm/s AV Area (VTI):     2.44 cm     PV VTI:           0.192  m AV Vmax:           123.50 cm/s  PV Peak grad:     3.6 mmHg AV Vmean:          81.400 cm/s  PV Mean grad:     2.0 mmHg AV VTI:            0.280 m      PR End Diast Vel: 17.47 msec AV Peak Grad:      6.1 mmHg AV Mean Grad:      3.0 mmHg LVOT Vmax:         83.40 cm/s LVOT Vmean:        51.900 cm/s LVOT VTI:          0.198 m LVOT/AV VTI ratio: 0.71  AORTA Ao Root diam: 2.60 cm Ao Asc diam:  2.70 cm MITRAL VALVE                  TRICUSPID VALVE MV Area (PHT): 3.89 cm       TR Peak grad:   67.2 mmHg MV Decel Time: 195 msec       TR Vmax:        410.00 cm/s MR Peak grad:    73.6 mmHg MR Vmax:         429.00 cm/s  SHUNTS MR PISA:         1.01 cm     Systemic VTI:  0.20  m MR PISA Eff ROA: 7 mm        Systemic Diam: 2.10 cm MR PISA Radius:  0.40 cm MV E velocity: 88.30 cm/s MV A velocity: 28.10 cm/s MV E/A ratio:  3.14 Fransico Him MD Electronically signed by Fransico Him MD Signature Date/Time: 11/13/2021/4:48:07 PM    Final    CT HEAD CODE STROKE WO CONTRAST  Result Date: 11/12/2021 CLINICAL DATA:  Code stroke.  Left-sided weakness EXAM: CT HEAD WITHOUT CONTRAST TECHNIQUE: Contiguous axial images were obtained from the base of the skull through the vertex without intravenous contrast. RADIATION DOSE REDUCTION: This exam was performed according to the departmental dose-optimization program which includes automated exposure control, adjustment of the mA and/or kV according to patient size and/or use of iterative reconstruction technique. COMPARISON:  None. FINDINGS: Brain: No definite acute cortical hypodensity. No hemorrhage, mass, mass effect, or midline shift. No hydrocephalus or extra-axial collection. Vascular: Dense right MCA (series 3, image 10), concerning for thrombosis. Skull: Normal. Negative for fracture or focal lesion. Sinuses/Orbits: Mucosal thickening in the ethmoid air cells. Otherwise negative. Other: The mastoids are well aerated. ASPECTS [facility] Stroke Program Early CT Score) - Ganglionic level  infarction (caudate, lentiform nuclei, internal capsule, insula, M1-M3 cortex): 7 - Supraganglionic infarction (M4-M6 cortex): 3 Total score (0-10 with 10 being normal): 9 IMPRESSION: 1. Dense right MCA, concerning for thrombosis, without definite cortical hypodensity. 2. ASPECTS is 10 Code stroke imaging results were communicated on 11/12/2021 at 9:37 pm to provider [facility] via telephone, who verbally acknowledged these results. Electronically Signed   By: Merilyn Baba M.D.   On: 11/12/2021 21:37   CT ANGIO HEAD NECK W WO CM W PERF (CODE STROKE)  Addendum Date: 11/12/2021   ADDENDUM REPORT: 11/12/2021 22:44 ADDENDUM: CT PERFUSION CBF<30%: 67 ml Tmax>6.0s: 180 Mismatch volume: 113 ml Mismatch ratio: 2.7 Infarct location: Right MCA territory Electronically Signed   By: Merilyn Baba M.D.   On: 11/12/2021 22:44   Result Date: 11/12/2021 CLINICAL DATA:  Left-sided weakness, slurred speech EXAM: CT ANGIOGRAPHY HEAD AND NECK TECHNIQUE: Multidetector CT imaging of the head and neck was performed using the standard protocol during bolus administration of intravenous contrast. Multiplanar CT image reconstructions and MIPs were obtained to evaluate the vascular anatomy. Carotid stenosis measurements (when applicable) are obtained utilizing NASCET criteria, using the distal internal carotid diameter as the denominator. RADIATION DOSE REDUCTION: This exam was performed according to the departmental dose-optimization program which includes automated exposure control, adjustment of the mA and/or kV according to patient size and/or use of iterative reconstruction technique. CONTRAST:  141mL OMNIPAQUE IOHEXOL 350 MG/ML SOLN COMPARISON:  No prior CTA, correlation is made with CT head 11/12/2021. FINDINGS: CT HEAD FINDINGS For noncontrast findings, please see same day CT head. CTA NECK FINDINGS Aortic arch: Standard branching. Imaged portion shows no evidence of aneurysm or dissection. No significant stenosis of the major arch  vessel origins. Right carotid system: Complete non opacification of the right internal carotid artery in the proximal to mid right ICA (series 7, image 155). The ICA remains non-opacified through its extracranial and intracranial portions. Left carotid system: No evidence of dissection, stenosis (50% or greater) or occlusion. Vertebral arteries: Codominant. No evidence of dissection, stenosis (50% or greater) or occlusion. Skeleton: No acute osseous abnormality. Degenerative changes in the cervical spine. Other neck: Negative. Upper chest: No focal pulmonary opacity or pleural effusion. Review of the MIP images confirms the above findings CTA HEAD Anterior circulation: Complete non opacification of the right  ICA to the terminus. The left ICA is patent, without significant stenosis. A1 segments patent, with opacification of the right A1 secondary to collateral flow from the left A1. Normal anterior communicating artery. Anterior cerebral arteries are patent to their distal aspects. Non opacification of the right MCA from the terminus through the M1 segment and bifurcation, there is some distal reconstitution, with contrast noted in a right M2 and some M3 branches (series 7, image). No left M1 stenosis or occlusion. Normal left MCA bifurcation. Distal left MCA branches are perfused. Posterior circulation: Vertebral arteries patent to the vertebrobasilar junction without stenosis. Posterior inferior cerebral arteries patent bilaterally. Basilar patent to its distal aspect. Superior cerebellar arteries patent bilaterally. Patent P1 segments. PCAs perfused to their distal aspects without stenosis. The bilateral posterior communicating arteries are not visualized. Venous sinuses: As permitted by contrast timing, patent. Anatomic variants: None significant. Review of the MIP images confirms the above findings IMPRESSION: 1. Complete non opacification of the proximal to mid right ICA, which extends through the intracranial  portions to the terminus. The right MCA does not opacify proximally, with some contrast in the distal MCA vessels. This is consistent with a dense MCA sign seen on the same-day head CT. No other large vessel occlusion or significant stenosis. 2. No hemodynamically significant stenosis in the vertebral arteries, left carotid system, or proximal right carotid system. Code stroke imaging results were communicated on 11/12/2021 at 10:18 pm to provider [facility] via telephone, who verbally acknowledged these results. Electronically Signed: By: Merilyn Baba M.D. On: 11/12/2021 22:24    Labs:  CBC: Recent Labs    11/12/21 2115 11/13/21 0600 11/14/21 0615  WBC 6.7 8.3 7.7  HGB 15.3* 13.8 13.0  HCT 43.6 39.8 38.0  PLT 210 228 205    COAGS: Recent Labs    11/12/21 2115 11/13/21 1129 11/13/21 2000 11/14/21 0615  INR 1.1  --   --   --   APTT 37* 34 36 67*    BMP: Recent Labs    11/12/21 2115 11/13/21 0600  NA 139 139  K 2.7* 3.7  CL 101 108  CO2 26 24  GLUCOSE 118* 99  BUN 9 6  CALCIUM 8.9 8.0*  CREATININE 0.58 0.47  GFRNONAA >60 >60    LIVER FUNCTION TESTS: Recent Labs    11/12/21 2115  BILITOT 0.7  AST 28  ALT 51*  ALKPHOS 86  PROT 6.2*  ALBUMIN 3.6    Assessment and Plan:  51 y/o F who presented to [facility] ED on 11/12/21 with acute onset lethargy, left sided weakness and slurred speech. She was found to have occluded right MCA, right ICA s/p thrombectomy of the right ICA and right MCA with complete revascularization seen today for follow up.   Patient with residual left sided weakness (UE > LE) and left facial droop, speech improved, tolerating PO intake and ambulation. Per patient planned for regular floor bed today and likely outpatient rehab.   No further NIR needs at this time, does not need to follow up with Dr. Estanislado Pandy as there is no stent in place. Recommended tobacco cessation and maintain regular follow up with cardiology.  Electronically Signed: Joaquim Nam, PA-C 11/14/2021, 9:17 AM   I spent a total of 15 Minutes at the the patient's bedside AND on the patient's hospital floor or unit, greater than 50% of which was counseling/coordinating care for right MCA/ICA thrombectomy.

## 2021-11-14 NOTE — TOC Benefit Eligibility Note (Signed)
Patient Teacher, English as a foreign language completed.    The patient is currently admitted and upon discharge could be taking dofetilidie (Tikosyn) 500 mcg capsules.  The current 30 day co-pay is, $84.93 due to a $8,700.00 deductible.   The patient is insured through [place], [place] Patient Advocate Specialist [place] Patient Advocate Team Direct Number: [phone]  Fax: [phone]

## 2021-11-14 NOTE — Progress Notes (Addendum)
Progress Note  Patient Name: Hannah Zimmerman Date of Encounter: 11/14/2021  [facility] Cardiologist: Tyson Dense  Subjective   Getting better  Inpatient Medications    Scheduled Meds:   stroke: mapping our early stages of recovery book   Does not apply Once   Chlorhexidine Gluconate Cloth  6 each Topical Q0600   flecainide  100 mg Oral Q12H   mouth rinse  15 mL Mouth Rinse BID   midodrine  10 mg Oral TID WC   nicotine  21 mg Transdermal Daily   Continuous Infusions:  sodium chloride 75 mL/hr at 11/14/21 0603   sodium chloride 75 mL/hr at 11/13/21 0244   heparin 1,200 Units/hr (11/14/21 0605)   phenylephrine (NEO-SYNEPHRINE) Adult infusion 30 mcg/min (11/14/21 0600)   PRN Meds: acetaminophen **OR** acetaminophen (TYLENOL) oral liquid 160 mg/5 mL **OR** acetaminophen, senna-docusate   Vital Signs    Vitals:   11/14/21 0530 11/14/21 0545 11/14/21 0600 11/14/21 0615  BP:   104/82   Pulse: (!) 59 (!) 49  (!) 53  Resp: 18 15 19 18   Temp:      TempSrc:      SpO2: 97% 94%  94%    Intake/Output Summary (Last 24 hours) at 11/14/2021 0723 Last data filed at 11/14/2021 0600 Gross per 24 hour  Intake 2991.96 ml  Output 200 ml  Net 2791.96 ml   Last 3 Weights 11/12/2021  Weight (lbs) 147 lb 11.3 oz  Weight (kg) 67 kg      Telemetry    SB/SR 50's-60's - Personally Reviewed  ECG    No new EKGs - Personally Reviewed  11/13/21 suspect is AFlutter 147bpm  Physical Exam   GEN: No acute distress.   Neck: No JVD Cardiac: RRR, bradycardic, no murmurs, rubs, or gallops.  Respiratory: CTA b/l. GI: Soft, nontender, non-distended  MS: No edema; No deformity. Neuro:  remains with some L sided weakness Psych: Normal affect   Labs    High Sensitivity Troponin:  No results for input(s): TROPONINIHS in the last 720 hours.   Chemistry Recent Labs  Lab 11/12/21 2115 11/13/21 0600  NA 139 139  K 2.7* 3.7  CL 101 108  CO2 26 24  GLUCOSE 118* 99  BUN 9 6   CREATININE 0.58 0.47  CALCIUM 8.9 8.0*  PROT 6.2*  --   ALBUMIN 3.6  --   AST 28  --   ALT 51*  --   ALKPHOS 86  --   BILITOT 0.7  --   GFRNONAA >60 >60  ANIONGAP 12 7    Lipids  Recent Labs  Lab 11/13/21 0600  CHOL 137  TRIG 45  HDL 41  LDLCALC 87  CHOLHDL 3.3    Hematology Recent Labs  Lab 11/12/21 2115 11/13/21 0600 11/14/21 0615  WBC 6.7 8.3 7.7  RBC 4.38 3.92 3.68*  HGB 15.3* 13.8 13.0  HCT 43.6 39.8 38.0  MCV 99.5 101.5* 103.3*  MCH 34.9* 35.2* 35.3*  MCHC 35.1 34.7 34.2  RDW 13.0 13.1 13.2  PLT 210 228 205   Thyroid No results for input(s): TSH, FREET4 in the last 168 hours.  BNPNo results for input(s): BNP, PROBNP in the last 168 hours.  DDimer No results for input(s): DDIMER in the last 168 hours.   Radiology    MR BRAIN WO CONTRAST Result Date: 11/13/2021 CLINICAL DATA:  Stroke follow-up EXAM: MRI HEAD WITHOUT CONTRAST TECHNIQUE: Multiplanar, multiecho pulse sequences of the brain and surrounding structures were obtained  without intravenous contrast. COMPARISON:  CTA from yesterday FINDINGS: Brain: Restricted diffusion at the right striatum with mild petechial hemorrhage. Borderline increased diffusion signal at the adjacent right frontal and insular cortex, but no swelling or hypointensity visualized on the ADC map. No hydrocephalus, mass, or collection. Vascular: Normal flow voids Skull and upper cervical spine: Normal marrow signal Sinuses/Orbits: Negative Other: Intermittent motion artifact. 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 Electronically Signed   By: Jorje Guild M.D.   On: 11/13/2021 12:58    CT HEAD CODE STROKE WO CONTRAST Result Date: 11/12/2021 CLINICAL DATA:  Code stroke.  Left-sided weakness EXAM: CT HEAD WITHOUT CONTRAST TECHNIQUE: Contiguous axial images were obtained from the base of the skull through the vertex without intravenous contrast. RADIATION DOSE REDUCTION: This exam was performed  according to the departmental dose-optimization program which includes automated exposure control, adjustment of the mA and/or kV according to patient size and/or use of iterative reconstruction technique. COMPARISON:  None. FINDINGS: Brain: No definite acute cortical hypodensity. No hemorrhage, mass, mass effect, or midline shift. No hydrocephalus or extra-axial collection. Vascular: Dense right MCA (series 3, image 10), concerning for thrombosis. Skull: Normal. Negative for fracture or focal lesion. Sinuses/Orbits: Mucosal thickening in the ethmoid air cells. Otherwise negative. Other: The mastoids are well aerated. ASPECTS [facility] Stroke Program Early CT Score) - Ganglionic level infarction (caudate, lentiform nuclei, internal capsule, insula, M1-M3 cortex): 7 - Supraganglionic infarction (M4-M6 cortex): 3 Total score (0-10 with 10 being normal): 9 IMPRESSION: 1. Dense right MCA, concerning for thrombosis, without definite cortical hypodensity. 2. ASPECTS is 10 Code stroke imaging results were communicated on 11/12/2021 at 9:37 pm to provider [facility] via telephone, who verbally acknowledged these results. Electronically Signed   By: Merilyn Baba M.D.   On: 11/12/2021 21:37   CT ANGIO HEAD NECK W WO CM W PERF (CODE STROKE) Addendum Date: 11/12/2021   ADDENDUM REPORT: 11/12/2021 22:44 ADDENDUM: CT PERFUSION CBF<30%: 67 ml Tmax>6.0s: 180 Mismatch volume: 113 ml Mismatch ratio: 2.7 Infarct location: Right MCA territory Electronically Signed   By: Merilyn Baba M.D.   On: 11/12/2021 22:44  Result Date: 11/12/2021 CLINICAL DATA:  Left-sided weakness, slurred speech EXAM: CT ANGIOGRAPHY HEAD AND NECK TECHNIQUE: Multidetector CT imaging of the head and neck was performed using the standard protocol during bolus administration of intravenous contrast. Multiplanar CT image reconstructions and MIPs were obtained to evaluate the vascular anatomy. Carotid stenosis measurements (when applicable) are obtained utilizing  NASCET criteria, using the distal internal carotid diameter as the denominator. RADIATION DOSE REDUCTION: This exam was performed according to the departmental dose-optimization program which includes automated exposure control, adjustment of the mA and/or kV according to patient size and/or use of iterative reconstruction technique. CONTRAST:  188mL OMNIPAQUE IOHEXOL 350 MG/ML SOLN COMPARISON:  No prior CTA, correlation is made with CT head 11/12/2021. FINDINGS: CT HEAD FINDINGS For noncontrast findings, please see same day CT head. CTA NECK FINDINGS Aortic arch: Standard branching. Imaged portion shows no evidence of aneurysm or dissection. No significant stenosis of the major arch vessel origins. Right carotid system: Complete non opacification of the right internal carotid artery in the proximal to mid right ICA (series 7, image 155). The ICA remains non-opacified through its extracranial and intracranial portions. Left carotid system: No evidence of dissection, stenosis (50% or greater) or occlusion. Vertebral arteries: Codominant. No evidence of dissection, stenosis (50% or greater) or occlusion. Skeleton: No acute osseous abnormality. Degenerative changes in the cervical spine. Other  neck: Negative. Upper chest: No focal pulmonary opacity or pleural effusion. Review of the MIP images confirms the above findings CTA HEAD Anterior circulation: Complete non opacification of the right ICA to the terminus. The left ICA is patent, without significant stenosis. A1 segments patent, with opacification of the right A1 secondary to collateral flow from the left A1. Normal anterior communicating artery. Anterior cerebral arteries are patent to their distal aspects. Non opacification of the right MCA from the terminus through the M1 segment and bifurcation, there is some distal reconstitution, with contrast noted in a right M2 and some M3 branches (series 7, image). No left M1 stenosis or occlusion. Normal left MCA  bifurcation. Distal left MCA branches are perfused. Posterior circulation: Vertebral arteries patent to the vertebrobasilar junction without stenosis. Posterior inferior cerebral arteries patent bilaterally. Basilar patent to its distal aspect. Superior cerebellar arteries patent bilaterally. Patent P1 segments. PCAs perfused to their distal aspects without stenosis. The bilateral posterior communicating arteries are not visualized. Venous sinuses: As permitted by contrast timing, patent. Anatomic variants: None significant. Review of the MIP images confirms the above findings IMPRESSION: 1. Complete non opacification of the proximal to mid right ICA, which extends through the intracranial portions to the terminus. The right MCA does not opacify proximally, with some contrast in the distal MCA vessels. This is consistent with a dense MCA sign seen on the same-day head CT. No other large vessel occlusion or significant stenosis. 2. No hemodynamically significant stenosis in the vertebral arteries, left carotid system, or proximal right carotid system. Code stroke imaging results were communicated on 11/12/2021 at 10:18 pm to provider [facility] via telephone, who verbally acknowledged these results. Electronically Signed: By: Merilyn Baba M.D. On: 11/12/2021 22:24    Cardiac Studies   11/13/21: TTE  1. Left ventricular ejection fraction, by estimation, is 60 to 65%. The  left ventricle has normal function. The left ventricle has no regional  wall motion abnormalities. There is severe concentric left ventricular  hypertrophy. Indeterminate diastolic  filling due to E-A fusion. Elevated left ventricular end-diastolic  pressure.   2. Right ventricular systolic function is normal. The right ventricular  size is moderately enlarged.   3. Right atrial size was moderately dilated.   4. Left atrial size was severely dilated.   5. The mitral valve is normal in structure. Mild mitral valve  regurgitation. No  evidence of mitral stenosis.   6. There is a soft tissue density on the noncoronary cusp extending into  the LVOT. 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 Differential dx includes  endocarditis (in right clinical setting) or valvular myxmoa. Images from  TEE done 11/08/21 demonstrate the   same soft tissue density (images 34-37 and 42 on TEE). There is also a  prominent nodule of Arantius on the [facility] that is normal finding.   7. Recommend cMRI for further tissue evaluation of the aortic valve mass.    11/08/21: TEE  1. Left ventricular ejection fraction, by estimation, is 50-55 %. The  left ventricle has normal function. The left ventricle has no regional  wall motion abnormalities. There is mild concentric left ventricular  hypertrophy.   2. Right ventricular systolic function is normal. The right ventricular  size is normal.   3. Left atrial size was severely dilated. No left atrial/left atrial  appendage thrombus was detected.   4. Right atrial size was moderately dilated.   5. The mitral valve is normal in structure. No evidence of mitral valve  regurgitation. No  evidence of mitral stenosis.   6. Tricuspid valve regurgitation is moderate.   7. The aortic valve is normal in structure. Aortic valve regurgitation is  not visualized. Aortic valve sclerosis is present, with no evidence of  aortic valve stenosis.   8. The inferior vena cava is normal in size with greater than 50%  respiratory variability, suggesting right atrial pressure of 3 mmHg.   9. Agitated saline contrast bubble study was negative, with no evidence  of any interatrial shunt.   Conclusion(s)/Recommendation(s): Normal biventricular function without  evidence of hemodynamically significant valvular heart disease.    Cardiac MRI 01/28/2020 Review of the above records today demonstrates:  1.  Normal LV chamber size, with hyperdynamic function, LVEF 67%. 2.  Normal RV chamber size, RVEF 70%. 3. Findings suggest  hypertrophic cardiomyopathy without obstruction, reverse curve morphology. Maximal wall thickness at the mid ventricular level is 19 mm. 4. Large area of dense delayed myocardial enhancement at the mid ventricular level in the area of maximal wall thickness. Visually appears > 15% of the myocardial mass. 5. Severe left atrial enlargement. Iatrogenic left to right atrial level shunt. 6.  No findings to suggests cardiac amyloidosis.  ECV 22%.  Patient Profile     51 y.o. female w.PMHx of HFpEF, morbid obesity 2/p gastric bypass, persistent atrial fibrillation, and tobacco abuse, HCM  She is status post atrial fibrillation ablation on 12/24/2019 with repeat ablation 08/11/2020.   She had a cardiac MRI which confirmed hypertrophic cardiomyopathy with greater than 15% LGE  Pt presented to [facility] 11/12/21 with sudden onset of lethargy, left-sided weakness and slurred speech > stroke > transferred to [facility] > Occluded RT ICA and RT MCA.  S/P complete revascularization of of RT ICA and Rt MCA with x1 pass with aspiration and x 1 pass with contact aspiration and 18mm x 45 mm embotrap retriever  achieving a TICI 3 revascularization   Assessment & Plan    stroke Persistent AFib CHA2DS2Vasc is now 4, on Eliquis outpt Recent TEE/DCCV 11/08/21 and reported compliance with Eliquis On heparin gtt Angela Vazguez defer to neurology/interventional neurology timing to transtion to [facility] and agent of choice Neo is at 30 Started on midodrine  TTE this admission abnormal findings on AV Suspect neurology may want another TEE, though Zitlali Primm await their visit today She is still on pressor Larua Collier ask echo reader to review  Dr. Curt Bears has seen the patient this AM Samoria Fedorko stop flecainide and plan for Tikosyn once out of ICU and off pressor  We would prefer when ready for out of ICU she go to [facility] where they are familiar with Tikosyn loading protocol I do not see any contraindicated meds Lavanna Rog ask pharmacy and case management to  eval in anticipation of start in the next day or so BMET/Mag today EKG   ADDEND: TTE from this admission and TEE from 11/08/21 (image 38 in TEE) reviewed by 2 of our readers this AM Mass on AV noted in both studies, for completeness recommended blood cultures, endocarditis, ? If a fibroelastoma. She is not systemically ill, and is afebrile I have reviewed with Dr. Curt Bears, he Ashlea Dusing discuss further with team, waiting to hear back from neurology tea, today In d/w Dr. Gardiner Rhyme, c.MRI not likely to be of benefit or get good images of the valve [facility] ordered ? If she Armarion Greek need CTS eval, Bonnye Halle need to d/w neurology    For questions or updates, please contact [place] Please consult [email or web address] for contact info under  Signed, Baldwin Jamaica, PA-C  11/14/2021, 7:23 AM    I have seen and examined this patient with Tommye Standard.  Agree with above, note added to reflect my findings.  Patient has been improving from her cryptogenic stroke.  In sinus rhythm today but with episodes of atrial fibrillation.  GEN: Well nourished, well developed, in no acute distress  HEENT: normal  Neck: no JVD, carotid bruits, or masses Cardiac: RRR; no murmurs, rubs, or gallops,no edema  Respiratory:  clear to auscultation bilaterally, normal work of breathing GI: soft, nontender, nondistended, + BS MS: no deformity or atrophy  Skin: warm and dry Neuro:  Strength and sensation are intact Psych: euthymic mood, full affect   Persistent atrial fibrillation: Currently anticoagulated with heparin.  Was previously on Eliquis.  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  She has had more frequent episodes of atrial fibrillation and thus would benefit from an alternative form of rhythm control.  Due to that, we Angelos Wasco plan to load with dofetilide.  Once she is out of the ICU, she Che Rachal need to be admitted to 6 E. for dofetilide  load. 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  Valvular myxoma is also in the differential.  We Arlynn Stare do further research on this as to whether or not there is any therapy necessary.  We Nahara Dona also discuss with neurology.  Shellby Schlink M. Rozanne Heumann MD 11/14/2021 2:00 PM

## 2021-11-14 NOTE — Progress Notes (Signed)
Inpatient Rehabilitation Admissions Coordinator  ° °I met at bedside with patient and her fiance. We dicussed goals and expectations of a possible CIR admit pending her progress as medical workup continues. I will follow up tomorrow. ° ° , RN, MSN °Rehab Admissions Coordinator °[phone] °11/14/2021 12:31 PM ° °

## 2021-11-14 NOTE — Progress Notes (Signed)
Physical Therapy Treatment Patient Details Name: Hannah Zimmerman MRN: [id number] DOB: [birth year] Today's Date: 11/14/2021   History of Present Illness 51 y.o. female presents to [facility] hospital on 11/12/2021 as a transfer from [facility] with sudden onset lethargy, L weakness, slurred speech, L neglect.CTA head and neck demonstrate occluded R ICA and MCA. Pt underwent thrombectomy on 2/25. PMH includes HFpEF, hypertrophic cardiomyopathy, Afib.    PT Comments    Pt sleepy and with flat affect today. HR 51 bpm after session, SPO2 96% on RA. Fiance and sister present. Pt worked on dynamic standing balance before ambulating. Needs encouragement to use LUE during balance tasks, keeps switching object to R hand. Worked on reaching in all planes, pt favors L side but did not lose balance to L today. Pt ambulated 200' and was able to increase pace and change directions, still remains on L half of hallway but milder L preference today than last session. PT will continue to follow.    Recommendations for follow up therapy are one component of a multi-disciplinary discharge planning process, led by the attending physician.  Recommendations may be updated based on patient status, additional functional criteria and insurance authorization.  Follow Up Recommendations  Acute inpatient rehab (3hours/day)     Assistance Recommended at Discharge Frequent or constant Supervision/Assistance  Patient can return home with the following A little help with walking and/or transfers;A little help with bathing/dressing/bathroom;Assistance with cooking/housework;Assist for transportation;Help with stairs or ramp for entrance;Direct supervision/assist for medications management;Direct supervision/assist for financial management   Equipment Recommendations  None recommended by PT    Recommendations for Other Services Rehab consult     Precautions / Restrictions Precautions Precautions: Fall Restrictions Weight  Bearing Restrictions: No     Mobility  Bed Mobility Overal bed mobility: Needs Assistance Bed Mobility: Supine to Sit, Sit to Supine     Supine to sit: Supervision Sit to supine: Supervision   General bed mobility comments: pt able to get out and back into bed without physical assist    Transfers Overall transfer level: Needs assistance Equipment used: None Transfers: Sit to/from Stand Sit to Stand: Min guard           General transfer comment: pt able to sit>stand without physical assist, min-guard for safety, L lean but no LOB    Ambulation/Gait Ambulation/Gait assistance: Min assist, Min guard Gait Distance (Feet): 200 Feet Assistive device: None, 1 person hand held assist Gait Pattern/deviations: Step-through pattern, Drifts right/left Gait velocity: reduced Gait velocity interpretation: >2.62 ft/sec, indicative of community ambulatory Pre-gait activities: performed standing D1/D2 reaching patterns with LUE before ambulation General Gait Details: L drift milder today. Pt holding her fiance's hand with her R hand at times. Pt able to increase pace and maintain that pace. Practiced bkwd walking with mild difficulty   Stairs             Wheelchair Mobility    Modified Rankin (Stroke Patients Only) Modified Rankin (Stroke Patients Only) Pre-Morbid Rankin Score: No symptoms Modified Rankin: Moderately severe disability     Balance Overall balance assessment: Needs assistance Sitting-balance support: No upper extremity supported, Feet supported Sitting balance-Leahy Scale: Fair Sitting balance - Comments: maintained mildline static sitting, posterior lean when working on balance activities Postural control: Left lateral lean Standing balance support: No upper extremity supported Standing balance-Leahy Scale: Fair Standing balance comment: supervision  Cognition Arousal/Alertness: Lethargic Behavior During Therapy:  WFL for tasks assessed/performed Overall Cognitive Status: Impaired/Different from baseline Area of Impairment: Problem solving, Awareness, Safety/judgement                       Following Commands: Follows one step commands with increased time Safety/Judgement: Decreased awareness of deficits, Decreased awareness of safety Awareness: Emergent Problem Solving: Slow processing General Comments: Requiring increased time throughout. Sleepy today. When intentionally using LUE with balance activities, needed frequent cues to stop switching objects back to R hand. Pt able to verbalize that she "goes to the L" when asking her about her deficits        Exercises      General Comments General comments (skin integrity, edema, etc.): pt fatigued after mobility, requested to lie down. Fiance and sister present. HR 51 bpm, SPO2 96% on RA.      Pertinent Vitals/Pain Pain Assessment Pain Assessment: No/denies pain Faces Pain Scale: No hurt    Home Living     Available Help at Discharge: Family Type of Home: House                  Prior Function            PT Goals (current goals can now be found in the care plan section) Acute Rehab PT Goals Patient Stated Goal: to return to independence PT Goal Formulation: With patient Time For Goal Achievement: 11/27/21 Potential to Achieve Goals: Good Progress towards PT goals: Progressing toward goals    Frequency    Min 4X/week      PT Plan Current plan remains appropriate    Co-evaluation              AM-PAC PT "6 Clicks" Mobility   Outcome Measure  Help needed turning from your back to your side while in a flat bed without using bedrails?: A Little Help needed moving from lying on your back to sitting on the side of a flat bed without using bedrails?: A Little Help needed moving to and from a bed to a chair (including a wheelchair)?: A Little Help needed standing up from a chair using your arms (e.g.,  wheelchair or bedside chair)?: A Little Help needed to walk in hospital room?: A Little Help needed climbing 3-5 steps with a railing? : A Lot 6 Click Score: 17    End of Session Equipment Utilized During Treatment: Gait belt Activity Tolerance: Patient tolerated treatment well Patient left: with call bell/phone within reach;in bed;with family/visitor present Nurse Communication: Mobility status PT Visit Diagnosis: Muscle weakness (generalized) (M62.81);Other symptoms and signs involving the nervous system (R29.898);Other abnormalities of gait and mobility (R26.89)     Time: 6948-5462 PT Time Calculation (min) (ACUTE ONLY): 30 min  Charges:  $Gait Training: 8-22 mins $Neuromuscular Re-education: 8-22 mins                     Leighton Roach, [place]  Pager [phone] Office [place] 11/14/2021, 1:41 PM

## 2021-11-14 NOTE — Progress Notes (Signed)
ANTICOAGULATION CONSULT NOTE - Initial Consult  Pharmacy Consult for Heparin   Indication: atrial fibrillation, stroke;s/p IR re-vascularization of R ICA/MCA   Allergies  Allergen Reactions   Codeine    Hydrocodone       Vital Signs: Temp: 97.6 F (36.4 C) (02/27 1200) Temp Source: Oral (02/27 1200) BP: 98/73 (02/27 1400) Pulse Rate: 51 (02/27 1400)  Labs: Recent Labs    11/12/21 2115 11/13/21 0600 11/13/21 1129 11/13/21 2000 11/14/21 0615 11/14/21 0917 11/14/21 1313  HGB 15.3* 13.8  --   --  13.0  --   --   HCT 43.6 39.8  --   --  38.0  --   --   PLT 210 228  --   --  205  --   --   APTT 37*  --  34 36 67*  --  47*  LABPROT 14.2  --   --   --   --   --   --   INR 1.1  --   --   --   --   --   --   HEPARINUNFRC  --   --  >1.10*  --  >1.10*  --   --   CREATININE 0.58 0.47  --   --   --  0.70 0.66     CrCl cannot be calculated (Unknown ideal weight.).     Assessment: 51 y/o F transfer from [facility] with stroke s/p re-vascularization with IR. Pt is on Apxiaban PTA, last dose 2/25 PM. Pharmacy consulted for heparin.  APTT 47, now subtherapeutic on 1200 units/hr. CBC WNL. No bleeding or issues with infusion per discussion with RN.   Goal of Therapy:  Heparin level 0.3-0.5 units/ml aPTT 66-84 secs Monitor platelets by anticoagulation protocol: Yes   Plan:  Increase heparin to 1350 units/hr F/u 8hr aPTT F/u aPTT until correlates with heparin level  Daily CBC/heparin level/aPTT Monitor for bleeding   Arturo Morton, PharmD, BCPS Please check AMION for all [place] contact numbers Clinical Pharmacist 11/14/2021 3:27 PM

## 2021-11-14 NOTE — Progress Notes (Signed)
Pharmacy: Dofetilide (Tikosyn) - Initial Consult Assessment and Electrolyte Replacement  Pharmacy consulted to assist in monitoring and replacing electrolytes in this 51 y.o. female admitted on 11/12/2021 undergoing dofetilide initiation. First dofetilide dose: pending patient transfer to [place] per EP  Assessment:  Patient Exclusion Criteria: If any screening criteria checked as "Yes", then  patient  should NOT receive dofetilide until criteria item is corrected.  If Yes please indicate correction plan.  YES  NO Patient  Exclusion Criteria Correction Plan   [x]   []   Baseline QTc interval is greater than or equal to 440 msec. IF above YES box checked dofetilide contraindicated unless patient has ICD; then may proceed if QTc 500-550 msec or with known ventricular conduction abnormalities may proceed with QTc 550-600 msec. QTc =   Per EP   []   [x]   Patient is known or suspected to have a digoxin level greater than 2 ng/ml: No results found for: DIGOXIN     []   [x]   Creatinine clearance less than 20 ml/min (calculated using Cockcroft-Gault, actual body weight and serum creatinine): CrCl cannot be calculated (Unknown ideal weight.).     []   [x]  Patient has received drugs known to prolong the QT intervals within the last 48 hours (phenothiazines, tricyclics or tetracyclic antidepressants, erythromycin, H-1 antihistamines, cisapride, fluoroquinolones, azithromycin). Drugs not listed above may have an, as yet, undetected potential to prolong the QT interval, updated information on QT prolonging agents is available at this website:QT prolonging agents or www.crediblemeds.org    []   [x]   Patient received a dose of hydrochlorothiazide (Oretic) alone or in any combination including triamterene (Dyazide, Maxzide) in the last 48 hours.    []   [x]  Patient received a medication known to increase dofetilide plasma concentrations prior to initial dofetilide dose:  Trimethoprim (Primsol, Proloprim)  in the last 36 hours Verapamil (Calan, Verelan) in the last 36 hours or a sustained release dose in the last 72 hours Megestrol (Megace) in the last 5 days  Cimetidine (Tagamet) in the last 6 hours Ketoconazole (Nizoral) in the last 24 hours Itraconazole (Sporanox) in the last 48 hours  Prochlorperazine (Compazine) in the last 36 hours     []   [x]   Patient is known to have a history of torsades de pointes; congenital or acquired long QT syndromes.    []   [x]   Patient has received a Class 1 antiarrhythmic with less than 2 half-lives since last dose. (Disopyramide, Quinidine, Procainamide, Lidocaine, Mexiletine, Flecainide, Propafenone)    []   [x]   Patient has received amiodarone therapy in the past 3 months or amiodarone level is greater than 0.3 ng/ml.    Patient has been appropriately anticoagulated with heparin.  Labs:    Component Value Date/Time   K 3.6 11/14/2021 0917   MG 1.8 11/14/2021 0917     Plan: Potassium: K 3.5-3.7:  Hold Tikosyn initiation and give KCl 60 mEq po x1 and repeat BMET 2hr after dose - repeat appropriate dose if K < 4    Magnesium: Mg 1.8-2: Give Mg 2 gm IV x1 to prevent Mg from dropping below 1.8 - do not need to recheck Mg. Appropriate to initiate Tikosyn   F/u patient transfer to [place] prior to planned Tikosyn start. F/u EP plans for QTc >500.    Arturo Morton, PharmD, BCPS Please check AMION for all [place] contact numbers Clinical Pharmacist 11/14/2021 3:26 PM

## 2021-11-14 NOTE — Progress Notes (Addendum)
STROKE TEAM PROGRESS NOTE   INTERVAL HISTORY Patient is seen in her room with family members at the bedside.  She has been hemodynamically stable, and her neurological exam remains stable.  Echocardiogram revealed a soft tissue mass on the noncoronary cusp of the aortic valve, workup pending.  Vitals:   11/14/21 1030 11/14/21 1100 11/14/21 1130 11/14/21 1200  BP: 118/85 108/80 110/80 107/78  Pulse: (!) 56 (!) 52 (!) 57 (!) 54  Resp:      Temp:    97.6 F (36.4 C)  TempSrc:    Oral  SpO2: 93% 93% 93% 94%   CBC:  Recent Labs  Lab 11/12/21 2115 11/13/21 0600 11/14/21 0615  WBC 6.7 8.3 7.7  NEUTROABS 3.6 6.2  --   HGB 15.3* 13.8 13.0  HCT 43.6 39.8 38.0  MCV 99.5 101.5* 103.3*  PLT 210 228 235    Basic Metabolic Panel:  Recent Labs  Lab 11/14/21 0917 11/14/21 1313  NA 141 139  K 3.6 3.9  CL 110 109  CO2 24 22  GLUCOSE 96 81  BUN 9 10  CREATININE 0.70 0.66  CALCIUM 8.5* 8.3*  MG 1.8  --     Lipid Panel:  Recent Labs  Lab 11/13/21 0600  CHOL 137  TRIG 45  HDL 41  CHOLHDL 3.3  VLDL 9  LDLCALC 87    HgbA1c:  Recent Labs  Lab 11/13/21 0600  HGBA1C 5.1    Urine Drug Screen: No results for input(s): LABOPIA, COCAINSCRNUR, LABBENZ, AMPHETMU, THCU, LABBARB in the last 168 hours.  Alcohol Level  Recent Labs  Lab 11/12/21 2139  ETH 12*     IMAGING past 24 hours ECHOCARDIOGRAM COMPLETE  Result Date: 11/13/2021    ECHOCARDIOGRAM REPORT   Patient Name:   Hannah Zimmerman Date of Exam: 11/13/2021 Medical Rec #:  [id number]      Height: Accession #:    [id number]     Weight:       147.7 lb Date of Birth:  [birth year]     BSA:          1.650 m Patient Age:    51 years       BP:           99/75 mmHg Patient Gender: F              HR:           48 bpm. Exam Location:  Inpatient Procedure: 2D Echo, Cardiac Doppler, Color Doppler, Strain Analysis and 3D Echo Indications:    CVA  History:        Patient has no prior history of Echocardiogram examinations.  Sonographer:     Luisa Hart RDCS Referring Phys: [id number] ASHISH ARORA IMPRESSIONS  1. Left ventricular ejection fraction, by estimation, is 60 to 65%. The left ventricle has normal function. The left ventricle has no regional wall motion abnormalities. There is severe concentric left ventricular hypertrophy. Indeterminate diastolic filling due to E-A fusion. Elevated left ventricular end-diastolic pressure.  2. Right ventricular systolic function is normal. The right ventricular size is moderately enlarged.  3. Right atrial size was moderately dilated.  4. Left atrial size was severely dilated.  5. The mitral valve is normal in structure. Mild mitral valve regurgitation. No evidence of mitral stenosis.  6. There is a soft tissue density on the noncoronary cusp extending into the LVOT. 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 Differential dx includes endocarditis (in right clinical setting) or valvular myxmoa. Images from  TEE done 11/08/21 demonstrate the  same soft tissue density (images 34-37 and 42 on TEE). There is also a prominent nodule of Arantius on the [facility] that is normal finding.  7. Recommend cMRI for further tissue evaluation of the aortic valve mass. FINDINGS  Left Ventricle: Left ventricular ejection fraction, by estimation, is 60 to 65%. The left ventricle has normal function. The left ventricle has no regional wall motion abnormalities. Global longitudinal strain performed but not reported based on interpreter judgement due to suboptimal tracking. The left ventricular internal cavity size was normal in size. There is severe concentric left ventricular hypertrophy. Indeterminate diastolic filling due to E-A fusion. Elevated left ventricular end-diastolic pressure. Right Ventricle: The right ventricular size is moderately enlarged. No increase in right ventricular wall thickness. Right ventricular systolic function is normal. Left Atrium: Left atrial size was severely dilated. Right Atrium: Right atrial size was  moderately dilated. Pericardium: There is no evidence of pericardial effusion. Mitral Valve: The mitral valve is normal in structure. Mild mitral annular calcification. Mild mitral valve regurgitation. No evidence of mitral valve stenosis. Tricuspid Valve: The tricuspid valve is normal in structure. Tricuspid valve regurgitation is mild . No evidence of tricuspid stenosis. Aortic Valve: There is a soft tissue density on the noncoronary cusp extending into the LVOT. 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 Differential dx includes endocarditis (in right clinical setting) or valvular myxmoa. Images from TEE done 11/08/21 demonstrate the same soft tissue density (images 34-37 and 42 on TEE). There is also a prominent nodule of Arantius on the [facility] that is normal finding. The aortic valve is tricuspid. Aortic valve regurgitation is not visualized. Aortic valve sclerosis is present, with no evidence of aortic valve stenosis. Aortic valve mean gradient measures 3.0 mmHg. Aortic valve peak gradient measures 6.1 mmHg. Aortic valve area, by VTI measures 2.44 cm. Pulmonic Valve: The pulmonic valve was normal in structure. Pulmonic valve regurgitation is trivial. No evidence of pulmonic stenosis. Aorta: The aortic root is normal in size and structure. Venous: The inferior vena cava was not well visualized. IAS/Shunts: No atrial level shunt detected by color flow Doppler.  LEFT VENTRICLE PLAX 2D LVIDd:         4.30 cm     Diastology LVIDs:         2.05 cm     LV e' medial:    4.33 cm/s LV PW:         1.50 cm     LV E/e' medial:  20.4 LV IVS:        1.40 cm     LV e' lateral:   5.46 cm/s LVOT diam:     2.10 cm     LV E/e' lateral: 16.2 LV SV:         69 LV SV Index:   42 LVOT Area:     3.46 cm  LV Volumes (MOD) LV vol d, MOD A4C: 48.0 ml LV vol s, MOD A4C: 23.8 ml LV SV MOD A4C:     48.0 ml RIGHT VENTRICLE RV Basal diam:  5.50 cm RV Mid diam:    3.20 cm LEFT ATRIUM           Index        RIGHT ATRIUM           Index LA Vol (A2C):  92.9 ml 56.31 ml/m  RA Area:     20.40 cm  RA Volume:   63.80 ml  38.67 ml/m  AORTIC VALVE                    PULMONIC VALVE AV Area (Vmax):    2.34 cm     PV Vmax:          0.95 m/s AV Area (Vmean):   2.21 cm     PV Vmean:         61.700 cm/s AV Area (VTI):     2.44 cm     PV VTI:           0.192 m AV Vmax:           123.50 cm/s  PV Peak grad:     3.6 mmHg AV Vmean:          81.400 cm/s  PV Mean grad:     2.0 mmHg AV VTI:            0.280 m      PR End Diast Vel: 17.47 msec AV Peak Grad:      6.1 mmHg AV Mean Grad:      3.0 mmHg LVOT Vmax:         83.40 cm/s LVOT Vmean:        51.900 cm/s LVOT VTI:          0.198 m LVOT/AV VTI ratio: 0.71  AORTA Ao Root diam: 2.60 cm Ao Asc diam:  2.70 cm MITRAL VALVE                  TRICUSPID VALVE MV Area (PHT): 3.89 cm       TR Peak grad:   67.2 mmHg MV Decel Time: 195 msec       TR Vmax:        410.00 cm/s MR Peak grad:    73.6 mmHg MR Vmax:         429.00 cm/s  SHUNTS MR PISA:         1.01 cm     Systemic VTI:  0.20 m MR PISA Eff ROA: 7 mm        Systemic Diam: 2.10 cm MR PISA Radius:  0.40 cm MV E velocity: 88.30 cm/s MV A velocity: 28.10 cm/s MV E/A ratio:  3.14 Fransico Him MD Electronically signed by Fransico Him MD Signature Date/Time: 11/13/2021/4:48:07 PM    Final     PHYSICAL EXAM  Physical Exam  Constitutional: Appears well-developed and well-nourished.  Cardiovascular: Normal rate and regular rhythm.  Respiratory: Effort normal, non-labored breathing  Neuro: Mental Status: Patient is awake, alert, oriented to person, place, month, year, and situation. Patient is able to give a clear and coherent history. No signs of aphasia or neglect Cranial Nerves: II: Visual Fields are full. Pupils are equal, round, and reactive to light.   III,IV, VI: EOMI without ptosis or diploplia.  V: Facial sensation is symmetric to temperature VII:  Left facial droop, left nasolabial fold flattening VIII: Hearing is intact to  voice X: Palate elevates symmetrically XI: Shoulder shrug is symmetric. XII: Tongue protrudes midline without atrophy or fasciculations.  Motor: Tone is normal. Bulk is normal. Mild drift noted on the left LUE 4/5  RUE 5/5 LLE 4/5 RLE 5/5 Sensory: Sensation is symmetric to light touch and temperature in the arms and legs. No extinction to DSS present.  Cerebellar: FNF are intact bilaterally Gait- deferred    ASSESSMENT/PLAN [name] is a 51 y.o. female  with history of atrial fibrillation on eliquis presenting with left hemiplegia, left facial droop left sided numbness, right gaze deviation, dysarthria and left field cut and left hemineglect. Taken to IR due to occluded Rt ICA and Rt MCA. TICI3 revascularization of Rt ICA and Rt MCA.   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 CTA head & neck Complete non opacification of the proximal to mid right ICA, which extends through the intracranial portions to the terminus. The right MCA does not opacify proximally, with some contrast in the distal MCA vessels.  CT perfusion CBF less than 30 of 67 cc and Tmax greater than 6 of 180 cc with a mismatch volume of 130 mL with mismatch ratio 2.7 in the right MCA territory Post IR CT  No ICH or mass effect. 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 2D Echo EF 60-65%, severe concentric LVH, soft tissue density on noncoronary cusp of aortic valve, severely dilated left atrium LDL 87 HgbA1c 5.1 VTE prophylaxis - IV heparin, SCDs Eliquis (apixaban) daily prior to admission, now on heparin IV.  Therapy recommendations:  CIR  Disposition:  pending  Atrial Fibrillation- cardiology consulted, appreciate  assistance  Previously on eliquis, flecainide, metoprolol  S/p ablation 4/21 and 11/21 TEE guided DCCV on 11/08/21 Cardiology considering dofetilide or amiodarone  severe LA enlargement and 15% LGE by cardiac MRI.  Soft tissue density on aortic valve soft tissue density on noncoronary cusp of aortic valve seen on 2D echo Discussed with cardiology, further workup pending  Hypertension Home meds:  lopressor 25mg  BID BP 120-140 Start midodrine to help with hypotension  Hyperlipidemia Home meds:  None LDL 87, goal < 70 Add Atorvastatin 40mg     Other Stroke Risk Factors Cigarette smoker, advised to stop smoking Nicotine patch ordered ETOH use, alcohol level 12, advised to drink no more than 1 drink(s) a day    Hospital day # 2  Patient seen and examined by NP/APP with MD. MD to update note as needed.   [place] , MSN, AGACNP-BC [facility] See Amion for schedule and pager information 11/14/2021 2:13 PM   ATTENDING ATTESTATION:   51 year old lady with history of atrial fibrillation had acute onset of left-sided hemiplegia slurred speech evaluated by teleneurology out of the window for TNK but on CTA has occluded right ICA/MCA status post successful IR thrombectomy.  Found to be in rapid A-fib with RVR cardiology consulted and EP cardiology also consulted.  Placed on heparin drip.  Has petechial hemorrhage on MRI.  She has a history of ablation x2 and flecainide with no success.  Exam with left arm leg drift, otherwise alert and oriented and follows commands.  Midodrine increased to 15 mg 3 times daily added for hypotension. Echo with ? Mass vs fibroelastoma. Blood cx ordered by cardiology to eval for endocarditis. Transfer to [place] for dofetilide loading per cardiology. Prefer switching to xeralto since she is eliquis failure at this point.     Dr. Reeves Forth evaluated pt independently, reviewed imaging, chart, labs. Discussed and formulated plan with the APP.  Please see APP note above for details.      This patient is critically ill due to acute stroke s/p IR and  at significant risk of neurological worsening, death form heart failure, respiratory failure, recurrent stroke, bleeding from [facility], seizure, sepsis. This patient's care requires constant monitoring of vital signs, hemodynamics, respiratory and cardiac monitoring, review of multiple databases, neurological assessment, discussion with family, other specialists and medical decision making of high complexity. I spent 35 minutes of neurocritical care time in the care of this patient.    Kodi Guerrera,MD   To contact Stroke Continuity provider, please refer to [email or web address] After hours, contact General Neurology

## 2021-11-14 NOTE — Progress Notes (Signed)
ANTICOAGULATION CONSULT NOTE   Pharmacy Consult for Heparin   Indication: atrial fibrillation, stroke;s/p IR re-vascularization of R ICA/MCA   Allergies  Allergen Reactions   Codeine    Hydrocodone       Vital Signs: Temp: 97.8 F (36.6 C) (02/27 0400) Temp Source: Axillary (02/27 0400) BP: 104/82 (02/27 0600) Pulse Rate: 53 (02/27 0615)  Labs: Recent Labs    11/12/21 2115 11/13/21 0600 11/13/21 1129 11/13/21 2000 11/14/21 0615  HGB 15.3* 13.8  --   --  13.0  HCT 43.6 39.8  --   --  38.0  PLT 210 228  --   --  205  APTT 37*  --  34 36 67*  LABPROT 14.2  --   --   --   --   INR 1.1  --   --   --   --   HEPARINUNFRC  --   --  >1.10*  --  >1.10*  CREATININE 0.58 0.47  --   --   --      CrCl cannot be calculated (Unknown ideal weight.).     Assessment: 51 y/o F transfer from [facility] with stroke s/p re-vascularization with IR. Pt is on Apxiaban PTA, last dose 2/25 PM. Pharmacy consulted for heparin.  2/27 AM update: aPTT therapeutic after rate increase   Goal of Therapy:  Heparin level 0.3-0.5 units/ml aPTT 66-84 secs Monitor platelets by anticoagulation protocol: Yes   Plan:  Cont heparin at 1200 units/hr 1400 aPTT  Narda Bonds, PharmD, BCPS Clinical Pharmacist Phone: [phone]

## 2021-11-15 ENCOUNTER — Other Ambulatory Visit (HOSPITAL_COMMUNITY): Payer: Self-pay

## 2021-11-15 DIAGNOSIS — I639 Cerebral infarction, unspecified: Secondary | ICD-10-CM | POA: Diagnosis not present

## 2021-11-15 LAB — CBC
HCT: 37.8 % (ref 36.0–46.0)
Hemoglobin: 13.3 g/dL (ref 12.0–15.0)
MCH: 35.7 pg — ABNORMAL HIGH (ref 26.0–34.0)
MCHC: 35.2 g/dL (ref 30.0–36.0)
MCV: 101.3 fL — ABNORMAL HIGH (ref 80.0–100.0)
Platelets: 183 10*3/uL (ref 150–400)
RBC: 3.73 MIL/uL — ABNORMAL LOW (ref 3.87–5.11)
RDW: 13.2 % (ref 11.5–15.5)
WBC: 8.4 10*3/uL (ref 4.0–10.5)
nRBC: 0 % (ref 0.0–0.2)

## 2021-11-15 LAB — BASIC METABOLIC PANEL
Anion gap: 7 (ref 5–15)
BUN: 10 mg/dL (ref 6–20)
CO2: 21 mmol/L — ABNORMAL LOW (ref 22–32)
Calcium: 8.4 mg/dL — ABNORMAL LOW (ref 8.9–10.3)
Chloride: 109 mmol/L (ref 98–111)
Creatinine, Ser: 0.66 mg/dL (ref 0.44–1.00)
GFR, Estimated: >60 mL/min (ref >60)
Glucose, Bld: 87 mg/dL (ref 70–99)
Potassium: 4.2 mmol/L (ref 3.5–5.1)
Sodium: 137 mmol/L (ref 135–145)

## 2021-11-15 LAB — HEPARIN LEVEL (UNFRACTIONATED)
Heparin Unfractionated: 0.79 IU/mL — ABNORMAL HIGH (ref 0.30–0.70)
Heparin Unfractionated: 0.81 IU/mL — ABNORMAL HIGH (ref 0.30–0.70)

## 2021-11-15 LAB — APTT
aPTT: 63 seconds — ABNORMAL HIGH (ref 24–36)
aPTT: 74 seconds — ABNORMAL HIGH (ref 24–36)

## 2021-11-15 MED ORDER — ATORVASTATIN CALCIUM 40 MG PO TABS
40.0000 mg | ORAL_TABLET | Freq: Every day | ORAL | 1 refills | Status: DC
  Filled 2021-11-15: qty 30, 30d supply, fill #0

## 2021-11-15 MED ORDER — NICOTINE 21 MG/24HR TD PT24
21.0000 mg | MEDICATED_PATCH | Freq: Every day | TRANSDERMAL | 0 refills | Status: DC
  Filled 2021-11-15: qty 28, 28d supply, fill #0

## 2021-11-15 MED ORDER — APIXABAN 5 MG PO TABS
5.0000 mg | ORAL_TABLET | Freq: Two times a day (BID) | ORAL | Status: DC
  Administered 2021-11-15: 5 mg via ORAL
  Filled 2021-11-15: qty 1

## 2021-11-15 MED ORDER — ATORVASTATIN CALCIUM 40 MG PO TABS: 40.0000 mg | ORAL_TABLET | Freq: Every day | ORAL | Status: DC

## 2021-11-15 MED ORDER — AMIODARONE HCL 200 MG PO TABS
400.0000 mg | ORAL_TABLET | Freq: Two times a day (BID) | ORAL | Status: DC
  Administered 2021-11-15: 400 mg via ORAL
  Filled 2021-11-15: qty 2

## 2021-11-15 MED ORDER — MIDODRINE HCL 5 MG PO TABS
15.0000 mg | ORAL_TABLET | Freq: Three times a day (TID) | ORAL | 1 refills | Status: DC
Start: 2021-11-15 — End: 2021-12-19
  Filled 2021-11-15: qty 270, 30d supply, fill #0

## 2021-11-15 MED ORDER — AMIODARONE HCL 200 MG PO TABS
400.0000 mg | ORAL_TABLET | Freq: Two times a day (BID) | ORAL | 0 refills | Status: DC
  Filled 2021-11-15: qty 60, 25d supply, fill #0

## 2021-11-15 NOTE — TOC Benefit Eligibility Note (Signed)
Patient Advocate Encounter  Prior Authorization for Eliquis 5 mg has been approved.    PA# [id number] Effective dates: 11/15/2021 through 05/14/2022  Patients co-pay is $558.51 due to a $5,974.28 deductible remaining.     Lyndel Safe, [place] Patient Advocate Specialist [place] Patient Advocate Team Direct Number: [phone]  Fax: [phone]

## 2021-11-15 NOTE — Progress Notes (Addendum)
STROKE TEAM PROGRESS NOTE   INTERVAL HISTORY Patient is seen in her room with family members at the bedside.  She has been hemodynamically stable. Now on Cardiology floor 6E.  Vitals:   11/14/21 2055 11/15/21 0022 11/15/21 0401 11/15/21 1058  BP: 101/64 120/73 104/63 119/85  Pulse: (!) 52 (!) 51 (!) 46 (!) 54  Resp: 18 16 16 16   Temp: 98.1 F (36.7 C) (!) 96.2 F (35.7 C) (!) 97.4 F (36.3 C) 98.1 F (36.7 C)  TempSrc: Oral Axillary Axillary Oral  SpO2: 97% 92% 93% 92%  Weight: 81 kg     Height: 5\' 4"  (1.626 m)      CBC:  Recent Labs  Lab 11/12/21 2115 11/13/21 0600 11/14/21 0615 11/15/21 0041  WBC 6.7 8.3 7.7 8.4  NEUTROABS 3.6 6.2  --   --   HGB 15.3* 13.8 13.0 13.3  HCT 43.6 39.8 38.0 37.8  MCV 99.5 101.5* 103.3* 101.3*  PLT 210 228 205 270    Basic Metabolic Panel:  Recent Labs  Lab 11/14/21 0917 11/14/21 1313 11/15/21 0041  NA 141 139 137  K 3.6 3.9 4.2  CL 110 109 109  CO2 24 22 21*  GLUCOSE 96 81 87  BUN 9 10 10   CREATININE 0.70 0.66 0.66  CALCIUM 8.5* 8.3* 8.4*  MG 1.8  --   --     Lipid Panel:  Recent Labs  Lab 11/13/21 0600  CHOL 137  TRIG 45  HDL 41  CHOLHDL 3.3  VLDL 9  LDLCALC 87    HgbA1c:  Recent Labs  Lab 11/13/21 0600  HGBA1C 5.1    Urine Drug Screen: No results for input(s): LABOPIA, COCAINSCRNUR, LABBENZ, AMPHETMU, THCU, LABBARB in the last 168 hours.  Alcohol Level  Recent Labs  Lab 11/12/21 2139  ETH 12*     IMAGING past 24 hours No results found.  PHYSICAL EXAM  Physical Exam  Constitutional: Appears well-developed and well-nourished.  Cardiovascular: Normal rate and regular rhythm.  Respiratory: Effort normal, non-labored breathing  Neuro: Mental Status: Patient is awake, alert, oriented to person, place, month, year, and situation. Patient is able to give a clear and coherent history. No signs of aphasia or neglect Cranial Nerves: II: Visual Fields are full. Pupils are equal, round, and reactive to  light.   III,IV, VI: EOMI without ptosis or diploplia.  V: Facial sensation is symmetric to temperature VII:  Left facial droop, left nasolabial fold flattening VIII: Hearing is intact to voice X: Palate elevates symmetrically XI: Shoulder shrug is symmetric. XII: Tongue protrudes midline without atrophy or fasciculations.  Motor: Tone is normal. Bulk is normal. Mild drift noted on the left LUE 4/5  RUE 5/5 LLE 4/5 RLE 5/5 Sensory: Sensation is symmetric to light touch and temperature in the arms and legs. No extinction to DSS present.  Cerebellar: FNF are intact bilaterally Gait- deferred    ASSESSMENT/PLAN [name] is a 51 y.o. female with history of atrial fibrillation on eliquis presenting with left hemiplegia, left facial droop left sided numbness, right gaze deviation, dysarthria and left field cut and left hemineglect. Taken to IR due to occluded Rt ICA and Rt MCA. TICI3 revascularization of Rt ICA and Rt MCA.   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 CTA head & neck Complete non opacification of the proximal to mid right ICA, which extends through the intracranial portions to the terminus. The right MCA does not opacify proximally, with some contrast in the distal MCA vessels.  CT perfusion CBF less than 30 of 67 cc and Tmax greater than 6 of 180 cc with a mismatch volume of 130 mL with mismatch ratio 2.7 in the right MCA territory Post IR CT  No ICH or mass effect. 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 2D Echo EF 60-65%, severe concentric LVH, soft tissue density on noncoronary cusp of aortic valve, severely dilated left atrium LDL 87 HgbA1c 5.1 VTE prophylaxis - IV  heparin, SCDs Eliquis (apixaban) daily prior to admission, now on heparin IV.  Stopped heparing today, start eliquis as her insurance did not cover other DOACs. Therapy recommendations:  oupt PT Disposition:  home  Atrial Fibrillation- cardiology consulted, appreciate assistance  Previously on eliquis, flecainide, metoprolol  S/p ablation 4/21 and 11/21 TEE guided DCCV on 11/08/21 Cardiology considering dofetilide or amiodarone  severe LA enlargement and 15% LGE by cardiac MRI.  Soft tissue density on aortic valve soft tissue density on noncoronary cusp of aortic valve seen on 2D echo Discussed with cardiology, further workup pending  Hypertension Home meds:  lopressor 25mg  BID BP 120-140 Start midodrine to help with hypotension  Hyperlipidemia Home meds:  None LDL 87, goal < 70 Con't Atorvastatin 40mg     Other Stroke Risk Factors Cigarette smoker, advised to stop smoking Nicotine patch ordered ETOH use, alcohol level 12, advised to drink no more than 1 drink(s) a day    Hospital day # 1    51 year old lady with history of atrial fibrillation had acute onset of left-sided hemiplegia slurred speech evaluated by teleneurology out of the window for TNK but on CTA has occluded right ICA/MCA status post successful IR thrombectomy.  Found to be in rapid A-fib with RVR cardiology consulted and EP cardiology also consulted.  Placed on heparin drip.  Has petechial hemorrhage on MRI.  She has a history of ablation x2 and flecainide with no success.  Exam with left arm leg drift, otherwise alert and oriented and follows commands.  Midodrine increased to 15 mg 3 times daily added for hypotension.   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 Dofetilide is not covered by her insurance so started on amiodarone. Heparin stoped today and will start eliquis as her insurance does not cover other DOACs. 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 Lipitor added today.  D/C home today with oupt PT and stroke clinic f/u.   Total of 35 mins spent reviewing chart, discussion with patient and family on prognosis, Dx and plan. Discussed case with patient's nurse. Reviewed Imaging personally.  Hannah Shambaugh,MD   To contact Stroke Continuity provider, please refer to [email or web address] After hours, contact General Neurology

## 2021-11-15 NOTE — Progress Notes (Signed)
Occupational Therapy Treatment Patient Details Name: Nalany Steedley MRN: [id number] DOB: [birth year] Today's Date: 11/15/2021   History of present illness 51 y.o. female presents to [facility] hospital on 11/12/2021 as a transfer from [place] [facility] with sudden onset lethargy, L weakness, slurred speech, L neglect. CTA head and neck demonstrate occluded R ICA and MCA. Pt underwent thrombectomy on 2/25. PMH includes HFpEF, hypertrophic cardiomyopathy, Afib.   OT comments  Patient continues to make steady progress towards goals in skilled OT session. Patient's session encompassed nueromuscular re-ed with regard to LUE. Patient is now able to complete gross motor movements in all planes with fair accuracy, occasionally overshooting but demonstrating good seated balance. Patient also demonstrates increased coordination, with majority of the session focusing on [facility] with LUE. Patient enjoys beading and arts and crafts at baseline, and patient able to complete multi-step beading task 3x prior to fatigue (bi-manual task). Patient continues to require max cues in order to engage LUE into tasks as patient frequently compensates with RUE. Patient also provided education with regard to BE FAST acronym. Discharge recommendation updated to Outpatient OT due to patient's progress. Therapy will continue to follow.    Recommendations for follow up therapy are one component of a multi-disciplinary discharge planning process, led by the attending physician.  Recommendations may be updated based on patient status, additional functional criteria and insurance authorization.    Follow Up Recommendations  Outpatient OT    Assistance Recommended at Discharge Frequent or constant Supervision/Assistance  Patient can return home with the following  A little help with walking and/or transfers;A little help with bathing/dressing/bathroom;Direct supervision/assist for financial management;Assist for transportation;Direct  supervision/assist for medications management;Assistance with cooking/housework   Equipment Recommendations  BSC/3in1    Recommendations for Other Services      Precautions / Restrictions Precautions Precautions: Fall Precaution Comments: bradycardia, hypotensive (BP goal 120-140 per neuro) Restrictions Weight Bearing Restrictions: No       Mobility Bed Mobility Overal bed mobility: Needs Assistance Bed Mobility: Supine to Sit, Sit to Supine     Supine to sit: Supervision Sit to supine: Supervision   General bed mobility comments: pt able to get out and back into bed without physical assist, cues for line awareness of IV on R arm needed    Transfers                         Balance                                           ADL either performed or assessed with clinical judgement   ADL                                         General ADL Comments: Session focus on LUE coordination and activities    Extremity/Trunk Assessment              Vision       Perception     Praxis      Cognition Arousal/Alertness: Awake/alert Behavior During Therapy: WFL for tasks assessed/performed Overall Cognitive Status: Impaired/Different from baseline Area of Impairment: Problem solving, Awareness, Safety/judgement, Memory                     Memory:  Decreased short-term memory Following Commands: Follows one step commands with increased time Safety/Judgement: Decreased awareness of deficits, Decreased awareness of safety Awareness: Emergent Problem Solving: Slow processing, Requires verbal cues General Comments: Patient continues to compensate for LUE by initiating all activities with RUE, requiring cues in order to redirect        Exercises      Shoulder Instructions       General Comments BP noted to be soft post-exertion standing in room, RN notified. BP 103/75 (84) supine pre-mobility, BP seated 108/69  (74) sitting EOB; BP 99/71 (79) upon standing; BP 84/65 (70) post-gait trial standing at bedside (pt c/o LLE fatigue but not dizzy)    Pertinent Vitals/ Pain       Pain Assessment Pain Assessment: Faces Faces Pain Scale: Hurts a little bit Pain Location: back Pain Descriptors / Indicators: Discomfort, Sore Pain Intervention(s): Monitored during session  Home Living                                          Prior Functioning/Environment              Frequency  Min 3X/week        Progress Toward Goals  OT Goals(current goals can now be found in the care plan section)  Progress towards OT goals: Progressing toward goals  Acute Rehab OT Goals Patient Stated Goal: to get back home to see my grandchildren OT Goal Formulation: With patient Time For Goal Achievement: 11/27/21 Potential to Achieve Goals: Good  Plan Discharge plan needs to be updated    Co-evaluation                 AM-PAC OT "6 Clicks" Daily Activity     Outcome Measure   Help from another person eating meals?: None Help from another person taking care of personal grooming?: A Little Help from another person toileting, which includes using toliet, bedpan, or urinal?: A Little Help from another person bathing (including washing, rinsing, drying)?: A Little Help from another person to put on and taking off regular upper body clothing?: A Little Help from another person to put on and taking off regular lower body clothing?: A Little 6 Click Score: 19    End of Session    OT Visit Diagnosis: Unsteadiness on feet (R26.81);Other abnormalities of gait and mobility (R26.89);Muscle weakness (generalized) (M62.81)   Activity Tolerance Patient tolerated treatment well   Patient Left in bed;with call bell/phone within reach;with bed alarm set   Nurse Communication Mobility status        Time: 9678-9381 OT Time Calculation (min): 32 min  Charges: OT General Charges $OT Visit: 1  Visit OT Treatments $Neuromuscular Re-education: 23-37 mins  Corinne Ports E. Bassel Gaskill, OTR/L Acute Rehabilitation Services [phone] [place] 11/15/2021, 3:16 PM

## 2021-11-15 NOTE — Progress Notes (Signed)
Physical Therapy Treatment Patient Details Name: Hannah Zimmerman MRN: [id number] DOB: 02/22/1971 Today's Date: 11/15/2021   History of Present Illness 51 y.o. female presents to [facility] hospital on 11/12/2021 as a transfer from [place] [facility] with sudden onset lethargy, L weakness, slurred speech, L neglect. CTA head and neck demonstrate occluded R ICA and MCA. Pt underwent thrombectomy on 2/25. PMH includes HFpEF, hypertrophic cardiomyopathy, Afib.    PT Comments    Pt received in supine sleeping but easily awoken and with good participation and tolerance for mobility tasks. Emphasis on safety with transfers and gait training using cane. Pt performed 4 steps with cane and unilateral rail needing min guard for safety and heavy cues for proper sequencing, pt with decreased carryover of safety cues within session, will greatly benefit from intensive neuro inpatient PT due to cognitive/way finding deficits and decreased problem solving/safety in order to return to baseline independence/working. Of note, pt standing BP taken and noted to be hypotensive post-exertion and HR remains bradycardic in high 40's-high 50's bpm throughout. Pt continues to benefit from PT services to progress toward functional mobility goals.   Recommendations for follow up therapy are one component of a multi-disciplinary discharge planning process, led by the attending physician.  Recommendations may be updated based on patient status, additional functional criteria and insurance authorization.  Follow Up Recommendations  Acute inpatient rehab (3hours/day)     Assistance Recommended at Discharge Frequent or constant Supervision/Assistance  Patient can return home with the following A little help with walking and/or transfers;A little help with bathing/dressing/bathroom;Assistance with cooking/housework;Assist for transportation;Help with stairs or ramp for entrance;Direct supervision/assist for medications management;Direct  supervision/assist for financial management   Equipment Recommendations  None recommended by PT    Recommendations for Other Services Rehab consult     Precautions / Restrictions Precautions Precautions: Fall Precaution Comments: bradycardia, hypotensive (BP goal 120-140 per neuro) Restrictions Weight Bearing Restrictions: No     Mobility  Bed Mobility Overal bed mobility: Needs Assistance Bed Mobility: Supine to Sit, Sit to Supine     Supine to sit: Supervision Sit to supine: Supervision   General bed mobility comments: pt able to get out and back into bed without physical assist, cues for line awareness of IV on R arm needed    Transfers Overall transfer level: Needs assistance Equipment used: Straight cane Transfers: Sit to/from Stand Sit to Stand: Supervision           General transfer comment: pt able to sit>stand without physical assist, no LOB or buckling using cane    Ambulation/Gait Ambulation/Gait assistance: Min guard, Supervision Gait Distance (Feet): 200 Feet (114ft, seated break, 275ft) Assistive device: Straight cane Gait Pattern/deviations: Step-through pattern, Drifts right/left Gait velocity: reduced     General Gait Details: emphasis on use of cane and activity pacing within tolerance, pt reports LLE fatigue but no buckling; HR does not go >59 bpm despite exertion/stairs, RN notified   Stairs Stairs: Yes Stairs assistance: Min guard, +2 safety/equipment Stair Management: One rail Left, With cane, Forwards, Step to pattern Number of Stairs: 4 ((3+1)) General stair comments: mod cues for sequencing, pt performed incorrectly when descending despite verbal cues pre performance and during trial, pt steadier when descending with LLE due to L weakness but no buckling. Pt reports fatigue post-exertion but able to walk back to room without seated break; Mobility tech present for safety to help with heavy doorway and IV mgmt   Wheelchair Mobility     Modified Rankin (Stroke Patients Only)  Modified Rankin (Stroke Patients Only) Pre-Morbid Rankin Score: No symptoms Modified Rankin: Moderately severe disability     Balance Overall balance assessment: Needs assistance Sitting-balance support: No upper extremity supported, Feet supported Sitting balance-Leahy Scale: Fair Sitting balance - Comments: no LOB with static sitting during BP assessment using 0-1 UE support   Standing balance support: No upper extremity supported Standing balance-Leahy Scale: Fair Standing balance comment: supervision for static standing, cane for dynamic tasks no overt LOB                            Cognition Arousal/Alertness: Awake/alert Behavior During Therapy: WFL for tasks assessed/performed Overall Cognitive Status: Impaired/Different from baseline Area of Impairment: Problem solving, Awareness, Safety/judgement, Memory                     Memory: Decreased short-term memory Following Commands: Follows one step commands with increased time Safety/Judgement: Decreased awareness of deficits, Decreased awareness of safety Awareness: Emergent Problem Solving: Slow processing, Requires verbal cues General Comments: When intentionally using LUE with balance activities, needed frequent cues to stop switching objects back to R hand. Pt forgetting to not move RUE when R arm BP taken, despite being taken >3 times during session needs tactile/verbal reminders not to move R arm. Pt reports her cognition "feels normal now".           General Comments General comments (skin integrity, edema, etc.): BP noted to be soft post-exertion standing in room, RN notified. BP 103/75 (84) supine pre-mobility, BP seated 108/69 (74) sitting EOB; BP 99/71 (79) upon standing; BP 84/65 (70) post-gait trial standing at bedside (pt c/o LLE fatigue but not dizzy)      Pertinent Vitals/Pain Pain Assessment Pain Assessment: Faces Faces Pain Scale: Hurts  little more Pain Location: R and posterior neck Pain Descriptors / Indicators: Discomfort, Sore     PT Goals (current goals can now be found in the care plan section) Acute Rehab PT Goals Patient Stated Goal: to return to independence PT Goal Formulation: With patient Time For Goal Achievement: 11/27/21 Progress towards PT goals: Progressing toward goals    Frequency    Min 4X/week      PT Plan Current plan remains appropriate       AM-PAC PT "6 Clicks" Mobility   Outcome Measure  Help needed turning from your back to your side while in a flat bed without using bedrails?: A Little Help needed moving from lying on your back to sitting on the side of a flat bed without using bedrails?: A Little Help needed moving to and from a bed to a chair (including a wheelchair)?: A Little Help needed standing up from a chair using your arms (e.g., wheelchair or bedside chair)?: A Little Help needed to walk in hospital room?: A Little Help needed climbing 3-5 steps with a railing? : A Lot (mod cues for technique) 6 Click Score: 17    End of Session Equipment Utilized During Treatment: Gait belt Activity Tolerance: Patient tolerated treatment well Patient left: with call bell/phone within reach;in bed;with family/visitor present (daughter in room, OT entering room) Nurse Communication: Mobility status;Other (comment) (BP soft, bradycardia) PT Visit Diagnosis: Muscle weakness (generalized) (M62.81);Other symptoms and signs involving the nervous system (R29.898);Other abnormalities of gait and mobility (R26.89)     Time: 7829-5621 PT Time Calculation (min) (ACUTE ONLY): 37 min  Charges:  $Gait Training: 8-22 mins $Therapeutic Activity: 8-22 mins  Houston Siren., PTA Acute Rehabilitation Services Pager: [phone] Office: [place] 11/15/2021, 12:57 PM

## 2021-11-15 NOTE — Progress Notes (Addendum)
Progress Note  Patient Name: Hannah Zimmerman Date of Encounter: 11/15/2021  [facility] Cardiologist: Tyson Dense  Subjective   Still weak on the left  Inpatient Medications    Scheduled Meds:   stroke: mapping our early stages of recovery book   Does not apply Once   amiodarone  400 mg Oral BID   midodrine  15 mg Oral TID   nicotine  21 mg Transdermal Daily   Continuous Infusions:  heparin 1,450 Units/hr (11/15/21 0415)   PRN Meds: acetaminophen **OR** acetaminophen (TYLENOL) oral liquid 160 mg/5 mL **OR** acetaminophen, senna-docusate   Vital Signs    Vitals:   11/14/21 2000 11/14/21 2055 11/15/21 0022 11/15/21 0401  BP: 95/63 101/64 120/73 104/63  Pulse: (!) 52 (!) 52 (!) 51 (!) 46  Resp: 16 18 16 16   Temp: 98.1 F (36.7 C) 98.1 F (36.7 C) (!) 96.2 F (35.7 C) (!) 97.4 F (36.3 C)  TempSrc: Oral Oral Axillary Axillary  SpO2: 95% 97% 92% 93%  Weight:  81 kg    Height:  5\' 4"  (1.626 m)      Intake/Output Summary (Last 24 hours) at 11/15/2021 0802 Last data filed at 11/14/2021 2200 Gross per 24 hour  Intake 514.85 ml  Output --  Net 514.85 ml   Last 3 Weights 11/14/2021 11/12/2021  Weight (lbs) 178 lb 8 oz 147 lb 11.3 oz  Weight (kg) 80.967 kg 67 kg      Telemetry    SB/SR nocturnal brady 40's - daytime 50's-60's - Personally Reviewed  ECG   SR 63bpm, manually measured QT 447ms, Qtc 474ms, qst degree AVBlock 226ms Today is SB 50bpm, QT 538ms, QTc 419ms, still a bit too long for tikosyn, PR shorter 206- Personally Reviewed   Physical Exam   GEN: No acute distress.   Neck: No JVD Cardiac: RRR, bradycardic, no murmurs, rubs, or gallops.  Respiratory: CTA b/l. GI: Soft, nontender, non-distended  MS: No edema; No deformity. Neuro:  remains with some L sided weakness Psych: Normal affect   Labs    High Sensitivity Troponin:  No results for input(s): TROPONINIHS in the last 720 hours.   Chemistry Recent Labs  Lab 11/12/21 2115 11/13/21 0600  11/14/21 0917 11/14/21 1313 11/15/21 0041  NA 139   < > 141 139 137  K 2.7*   < > 3.6 3.9 4.2  CL 101   < > 110 109 109  CO2 26   < > 24 22 21*  GLUCOSE 118*   < > 96 81 87  BUN 9   < > 9 10 10   CREATININE 0.58   < > 0.70 0.66 0.66  CALCIUM 8.9   < > 8.5* 8.3* 8.4*  MG  --   --  1.8  --   --   PROT 6.2*  --   --   --   --   ALBUMIN 3.6  --   --   --   --   AST 28  --   --   --   --   ALT 51*  --   --   --   --   ALKPHOS 86  --   --   --   --   BILITOT 0.7  --   --   --   --   GFRNONAA >60   < > >60 >60 >60  ANIONGAP 12   < > 7 8 7    < > = values in this  interval not displayed.    Lipids  Recent Labs  Lab 11/13/21 0600  CHOL 137  TRIG 45  HDL 41  LDLCALC 87  CHOLHDL 3.3    Hematology Recent Labs  Lab 11/13/21 0600 11/14/21 0615 11/15/21 0041  WBC 8.3 7.7 8.4  RBC 3.92 3.68* 3.73*  HGB 13.8 13.0 13.3  HCT 39.8 38.0 37.8  MCV 101.5* 103.3* 101.3*  MCH 35.2* 35.3* 35.7*  MCHC 34.7 34.2 35.2  RDW 13.1 13.2 13.2  PLT 228 205 183   Thyroid No results for input(s): TSH, FREET4 in the last 168 hours.  BNPNo results for input(s): BNP, PROBNP in the last 168 hours.  DDimer No results for input(s): DDIMER in the last 168 hours.   Radiology    MR BRAIN WO CONTRAST Result Date: 11/13/2021 CLINICAL DATA:  Stroke follow-up EXAM: MRI HEAD WITHOUT CONTRAST TECHNIQUE: Multiplanar, multiecho pulse sequences of the brain and surrounding structures were obtained without intravenous contrast. COMPARISON:  CTA from yesterday FINDINGS: Brain: Restricted diffusion at the right striatum with mild petechial hemorrhage. Borderline increased diffusion signal at the adjacent right frontal and insular cortex, but no swelling or hypointensity visualized on the ADC map. No hydrocephalus, mass, or collection. Vascular: Normal flow voids Skull and upper cervical spine: Normal marrow signal Sinuses/Orbits: Negative Other: Intermittent motion artifact. 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 Electronically Signed   By: Jorje Guild M.D.   On: 11/13/2021 12:58    CT HEAD CODE STROKE WO CONTRAST Result Date: 11/12/2021 CLINICAL DATA:  Code stroke.  Left-sided weakness EXAM: CT HEAD WITHOUT CONTRAST TECHNIQUE: Contiguous axial images were obtained from the base of the skull through the vertex without intravenous contrast. RADIATION DOSE REDUCTION: This exam was performed according to the departmental dose-optimization program which includes automated exposure control, adjustment of the mA and/or kV according to patient size and/or use of iterative reconstruction technique. COMPARISON:  None. FINDINGS: Brain: No definite acute cortical hypodensity. No hemorrhage, mass, mass effect, or midline shift. No hydrocephalus or extra-axial collection. Vascular: Dense right MCA (series 3, image 10), concerning for thrombosis. Skull: Normal. Negative for fracture or focal lesion. Sinuses/Orbits: Mucosal thickening in the ethmoid air cells. Otherwise negative. Other: The mastoids are well aerated. ASPECTS [facility] Stroke Program Early CT Score) - Ganglionic level infarction (caudate, lentiform nuclei, internal capsule, insula, M1-M3 cortex): 7 - Supraganglionic infarction (M4-M6 cortex): 3 Total score (0-10 with 10 being normal): 9 IMPRESSION: 1. Dense right MCA, concerning for thrombosis, without definite cortical hypodensity. 2. ASPECTS is 10 Code stroke imaging results were communicated on 11/12/2021 at 9:37 pm to provider [facility] via telephone, who verbally acknowledged these results. Electronically Signed   By: Merilyn Baba M.D.   On: 11/12/2021 21:37   CT ANGIO HEAD NECK W WO CM W PERF (CODE STROKE) Addendum Date: 11/12/2021   ADDENDUM REPORT: 11/12/2021 22:44 ADDENDUM: CT PERFUSION CBF<30%: 67 ml Tmax>6.0s: 180 Mismatch volume: 113 ml Mismatch ratio: 2.7 Infarct location: Right MCA territory Electronically Signed   By: Merilyn Baba M.D.   On: 11/12/2021 22:44   Result Date: 11/12/2021 CLINICAL DATA:  Left-sided weakness, slurred speech EXAM: CT ANGIOGRAPHY HEAD AND NECK TECHNIQUE: Multidetector CT imaging of the head and neck was performed using the standard protocol during bolus administration of intravenous contrast. Multiplanar CT image reconstructions and MIPs were obtained to evaluate the vascular anatomy. Carotid stenosis measurements (when applicable) are obtained utilizing NASCET criteria, using the distal internal carotid diameter as the denominator. RADIATION  DOSE REDUCTION: This exam was performed according to the departmental dose-optimization program which includes automated exposure control, adjustment of the mA and/or kV according to patient size and/or use of iterative reconstruction technique. CONTRAST:  146mL OMNIPAQUE IOHEXOL 350 MG/ML SOLN COMPARISON:  No prior CTA, correlation is made with CT head 11/12/2021. FINDINGS: CT HEAD FINDINGS For noncontrast findings, please see same day CT head. CTA NECK FINDINGS Aortic arch: Standard branching. Imaged portion shows no evidence of aneurysm or dissection. No significant stenosis of the major arch vessel origins. Right carotid system: Complete non opacification of the right internal carotid artery in the proximal to mid right ICA (series 7, image 155). The ICA remains non-opacified through its extracranial and intracranial portions. Left carotid system: No evidence of dissection, stenosis (50% or greater) or occlusion. Vertebral arteries: Codominant. No evidence of dissection, stenosis (50% or greater) or occlusion. Skeleton: No acute osseous abnormality. Degenerative changes in the cervical spine. Other neck: Negative. Upper chest: No focal pulmonary opacity or pleural effusion. Review of the MIP images confirms the above findings CTA HEAD Anterior circulation: Complete non opacification of the right ICA to the terminus. The left ICA is patent, without significant stenosis. A1 segments patent, with  opacification of the right A1 secondary to collateral flow from the left A1. Normal anterior communicating artery. Anterior cerebral arteries are patent to their distal aspects. Non opacification of the right MCA from the terminus through the M1 segment and bifurcation, there is some distal reconstitution, with contrast noted in a right M2 and some M3 branches (series 7, image). No left M1 stenosis or occlusion. Normal left MCA bifurcation. Distal left MCA branches are perfused. Posterior circulation: Vertebral arteries patent to the vertebrobasilar junction without stenosis. Posterior inferior cerebral arteries patent bilaterally. Basilar patent to its distal aspect. Superior cerebellar arteries patent bilaterally. Patent P1 segments. PCAs perfused to their distal aspects without stenosis. The bilateral posterior communicating arteries are not visualized. Venous sinuses: As permitted by contrast timing, patent. Anatomic variants: None significant. Review of the MIP images confirms the above findings IMPRESSION: 1. Complete non opacification of the proximal to mid right ICA, which extends through the intracranial portions to the terminus. The right MCA does not opacify proximally, with some contrast in the distal MCA vessels. This is consistent with a dense MCA sign seen on the same-day head CT. No other large vessel occlusion or significant stenosis. 2. No hemodynamically significant stenosis in the vertebral arteries, left carotid system, or proximal right carotid system. Code stroke imaging results were communicated on 11/12/2021 at 10:18 pm to provider [facility] via telephone, who verbally acknowledged these results. Electronically Signed: By: Merilyn Baba M.D. On: 11/12/2021 22:24    Cardiac Studies   11/13/21: TTE  1. Left ventricular ejection fraction, by estimation, is 60 to 65%. The  left ventricle has normal function. The left ventricle has no regional  wall motion abnormalities. There is severe  concentric left ventricular  hypertrophy. Indeterminate diastolic  filling due to E-A fusion. Elevated left ventricular end-diastolic  pressure.   2. Right ventricular systolic function is normal. The right ventricular  size is moderately enlarged.   3. Right atrial size was moderately dilated.   4. Left atrial size was severely dilated.   5. The mitral valve is normal in structure. Mild mitral valve  regurgitation. No evidence of mitral stenosis.   6. There is a soft tissue density on the noncoronary cusp extending into  the LVOT. 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 Differential dx  includes  endocarditis (in right clinical setting) or valvular myxmoa. Images from  TEE done 11/08/21 demonstrate the   same soft tissue density (images 34-37 and 42 on TEE). There is also a  prominent nodule of Arantius on the [facility] that is normal finding.   7. Recommend cMRI for further tissue evaluation of the aortic valve mass.    11/08/21: TEE  1. Left ventricular ejection fraction, by estimation, is 50-55 %. The  left ventricle has normal function. The left ventricle has no regional  wall motion abnormalities. There is mild concentric left ventricular  hypertrophy.   2. Right ventricular systolic function is normal. The right ventricular  size is normal.   3. Left atrial size was severely dilated. No left atrial/left atrial  appendage thrombus was detected.   4. Right atrial size was moderately dilated.   5. The mitral valve is normal in structure. No evidence of mitral valve  regurgitation. No evidence of mitral stenosis.   6. Tricuspid valve regurgitation is moderate.   7. The aortic valve is normal in structure. Aortic valve regurgitation is  not visualized. Aortic valve sclerosis is present, with no evidence of  aortic valve stenosis.   8. The inferior vena cava is normal in size with greater than 50%  respiratory variability, suggesting right atrial pressure of 3 mmHg.   9. Agitated saline  contrast bubble study was negative, with no evidence  of any interatrial shunt.   Conclusion(s)/Recommendation(s): Normal biventricular function without  evidence of hemodynamically significant valvular heart disease.    Cardiac MRI 01/28/2020 Review of the above records today demonstrates:  1.  Normal LV chamber size, with hyperdynamic function, LVEF 67%. 2.  Normal RV chamber size, RVEF 70%. 3. 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 Maximal wall thickness at the mid ventricular level is 19 mm. 4. Large area of dense delayed myocardial enhancement at the mid ventricular level in the area of maximal wall thickness. Visually appears > 15% of the myocardial mass. 5. Severe left atrial enlargement. Iatrogenic left to right atrial level shunt. 6.  No findings to suggests cardiac amyloidosis.  ECV 22%.  Patient Profile     51 y.o. female w.PMHx of HFpEF, morbid obesity 2/p gastric bypass, persistent atrial fibrillation, and tobacco abuse, HCM  She is status post atrial fibrillation ablation on 12/24/2019 with repeat ablation 08/11/2020.   She had a cardiac MRI which confirmed hypertrophic cardiomyopathy with greater than 15% LGE  Pt presented to [facility] 11/12/21 with sudden onset of lethargy, left-sided weakness and slurred speech > stroke > transferred to [facility] > Occluded RT ICA and RT MCA.  S/P complete revascularization of of RT ICA and Rt MCA with x1 pass with aspiration and x 1 pass with contact aspiration and 37mm x 45 mm embotrap retriever  achieving a TICI 3 revascularization   Assessment & Plan    stroke Persistent AFib CHA2DS2Vasc is now 4, on Eliquis outpt Recent TEE/DCCV 11/08/21 and reported compliance with Eliquis  We Reisa Coppola defer timing to transition heparin gtt to [facility] of neurology's choice to neuro team  Maintaining SR QTc is unfortunately too long for Tikosyn Start amiodarone 400mg  BID today and plan down titration to  maintenance dose  We can load amiodarone out patient Discharge when ready by stroke team    3. Abnormal AV on TTE/TEE ?Fibroelastoma ?endocarditis,  ?myxoma In d/w one of our cardiac MRI readers, did not think MRI would be able to see well enough to be  helpful In brief d/w neuro and review of their note, suspect stroke 2/2 her AFib and failure of Eliquis despite TEE pre-DCCV  BC are ordered but not drawn yet Angala Hilgers follow neuro recs May need further eval out patient     For questions or updates, please contact [place] Please consult [email or web address] for contact info under        Signed, Baldwin [place], PA-C  11/15/2021, 8:02 AM    I have seen and examined this patient with Tommye Standard.  Agree with above, note added to reflect my findings.  Feeling well and remains in sinus rhythm.  Continues to have some left-sided weakness, though she is improving.  GEN: Well nourished, well developed, in no acute distress  HEENT: normal  Neck: no JVD, carotid bruits, or masses Cardiac: RRR; no murmurs, rubs, or gallops,no edema  Respiratory:  clear to auscultation bilaterally, normal work of breathing GI: soft, nontender, nondistended, + BS MS: no deformity or atrophy  Skin: warm and dry Neuro:  Strength and sensation are intact Psych: euthymic mood, full affect   Persistent atrial fibrillation: Dofetilide would be quite expensive.  Her QTc also is somewhat prolonged prior to starting the medication.  Due to that, we Alishba Naples start her on amiodarone.  She is currently on IV heparin.  If neurology feels that her stroke is a failure of Eliquis, Xarelto would be a reasonable option. 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  Blood cultures are pending as well.  We [place] continue to look into this as to whether or not a surgical option would be needed for removal, though neurology potentially feels that the stroke is due to recent  cardioversion.  Kailen Name M. Redell Nazir MD 11/15/2021 11:41 AM

## 2021-11-15 NOTE — Progress Notes (Signed)
[place] for Heparin   Indication: atrial fibrillation, stroke;s/p IR re-vascularization of R ICA/MCA   Allergies  Allergen Reactions   Codeine    Hydrocodone       Vital Signs: Temp: 98.1 F (36.7 C) (02/28 1058) Temp Source: Oral (02/28 1058) BP: 119/85 (02/28 1058) Pulse Rate: 54 (02/28 1058)  Labs: Recent Labs    11/12/21 2115 11/13/21 0600 11/13/21 1129 11/14/21 0615 11/14/21 0917 11/14/21 1313 11/15/21 0041 11/15/21 1023  HGB 15.3* 13.8  --  13.0  --   --  13.3  --   HCT 43.6 39.8  --  38.0  --   --  37.8  --   PLT 210 228  --  205  --   --  183  --   APTT 37*  --    < > 67*  --  47* 63* 74*  LABPROT 14.2  --   --   --   --   --   --   --   INR 1.1  --   --   --   --   --   --   --   HEPARINUNFRC  --   --    < > >1.10*  --   --  0.79* 0.81*  CREATININE 0.58 0.47  --   --  0.70 0.66 0.66  --    < > = values in this interval not displayed.     Estimated Creatinine Clearance: 86.6 mL/min (by C-G formula based on SCr of 0.66 mg/dL).     Assessment: 51 y/o F transfer from [facility] with stroke s/p re-vascularization with IR. Pt is on Apxiaban PTA, last dose 2/25 PM. Pharmacy consulted for heparin. Plans noted for likely change in oral anticoagulation (possibly Xarelto) -aPTT at goal, hg= 13.3  Goal of Therapy:  Heparin level 0.3-0.5 units/ml aPTT 66-84 secs Monitor platelets by anticoagulation protocol: Yes   Plan:  Continue heparin 1450 units/hr Daily CBC/heparin level/aPTT   Hildred Laser, PharmD Clinical Pharmacist **Pharmacist phone directory can now be found on [email or web address] ([email or web address]).  Listed under [place].

## 2021-11-15 NOTE — Plan of Care (Signed)
°  Problem: Education: Goal: Knowledge of disease or condition will improve Outcome: Adequate for Discharge Goal: Knowledge of secondary prevention will improve (SELECT ALL) Outcome: Adequate for Discharge Goal: Knowledge of patient specific risk factors will improve (INDIVIDUALIZE FOR PATIENT) Outcome: Adequate for Discharge Goal: Individualized Educational Video(s) Outcome: Adequate for Discharge   Problem: Coping: Goal: Will verbalize positive feelings about self Outcome: Adequate for Discharge Goal: Will identify appropriate support needs Outcome: Adequate for Discharge   Problem: Health Behavior/Discharge Planning: Goal: Ability to manage health-related needs will improve Outcome: Adequate for Discharge   Problem: Self-Care: Goal: Ability to participate in self-care as condition permits will improve Outcome: Adequate for Discharge Goal: Verbalization of feelings and concerns over difficulty with self-care will improve Outcome: Adequate for Discharge Goal: Ability to communicate needs accurately will improve Outcome: Adequate for Discharge   Problem: Nutrition: Goal: Risk of aspiration will decrease Outcome: Adequate for Discharge Goal: Dietary intake will improve Outcome: Adequate for Discharge   Problem: Ischemic Stroke/TIA Tissue Perfusion: Goal: Complications of ischemic stroke/TIA will be minimized Outcome: Adequate for Discharge

## 2021-11-15 NOTE — Care Management (Signed)
11-15-21 1453 Case Manager spoke with patient and she wants to do outpatient PT/OT at [facility]. An ambulatory referral has been submitted via Epic and the office will call the patient within 3-5 business days. Patient is aware to call the office if they have not called for an appointment. Case Manager ordered the cane via [employer] and it will be delivered to the room. No further needs from Case Manager at this time.

## 2021-11-15 NOTE — Plan of Care (Signed)
  Problem: Education: Goal: Knowledge of disease or condition will improve Outcome: Progressing Goal: Knowledge of secondary prevention will improve (SELECT ALL) Outcome: Progressing   

## 2021-11-15 NOTE — TOC Benefit Eligibility Note (Addendum)
Patient Research scientist (life sciences) completed.     The patient is currently admitted and upon discharge could be taking Xarelto 20 mg.   Requires Prior Authorization  The patient is currently admitted and upon discharge could be taking dabigatran (Pradaxa) 150 mg.   Requires Prior Authorization  The patient is currently admitted and upon discharge could be taking Eliquis 5 mg.   Requires Prior Authorization   The patient is insured through [place] Patient Advocate Specialist [place] Patient Advocate Team Direct Number: [phone]  Fax: [phone]

## 2021-11-15 NOTE — Progress Notes (Signed)
Inpatient Rehabilitation Admissions Coordinator   I met with patient and her sister at bedside. She has progressed well with therapy and no longer in need of an inpatient rehab stay. She has very supportive family at home and prefers discharge home. I recommend outpatient therapy and they are in agreement. I have notified acute team and TOC. We will sign off.  Danne Baxter, RN, MSN Rehab Admissions Coordinator [phone] 11/15/2021 2:26 PM

## 2021-11-15 NOTE — Discharge Instructions (Addendum)
You have an appointment set up with the [facility].  Multiple studies have shown that being followed by a dedicated atrial fibrillation clinic in addition to the standard care you receive from your other physicians improves health. We believe that enrollment in the atrial fibrillation clinic will allow [place] to better care for you.   The phone number to the [facility] is [phone]. The clinic is staffed Monday through Friday from 8:30am to 5pm.  Parking Directions: The clinic is located in the Heart and Vascular Building connected to [facility]. 1)From [address] turn on to [address] and go to the 3rd entrance  (Heart and Vascular entrance) on the right. 2)Look to the right for Heart &Vascular Parking Garage. 3)A code for the entrance is required, for March is 1102.   4)Take the elevators to the 1st floor. Registration is in the room with the glass walls at the end of the hallway.  If you have any trouble parking or locating the clinic, please dont hesitate to call [phone].     Information on my medicine - ELIQUIS (apixaban)  Why was Eliquis prescribed for you? Eliquis was prescribed for you to reduce the risk of a blood clot forming that can cause a stroke if you have a medical condition called atrial fibrillation (a type of irregular heartbeat).  What do You need to know about Eliquis ? Take your Eliquis TWICE DAILY - one tablet in the morning and one tablet in the evening with or without food. If you have difficulty swallowing the tablet whole please discuss with your pharmacist how to take the medication safely.  Take Eliquis exactly as prescribed by your doctor and DO NOT stop taking Eliquis without talking to the doctor who prescribed the medication.  Stopping may increase your risk of developing a stroke.  Refill your prescription before you run out.  After discharge, you should have regular check-up appointments with your  healthcare provider that is prescribing your Eliquis.  In the future your dose may need to be changed if your kidney function or weight changes by a significant amount or as you get older.  What do you do if you miss a dose? If you miss a dose, take it as soon as you remember on the same day and resume taking twice daily.  Do not take more than one dose of ELIQUIS at the same time to make up a missed dose.  Important Safety Information A possible side effect of Eliquis is bleeding. You should call your healthcare provider right away if you experience any of the following: Bleeding from an injury or your nose that does not stop. Unusual colored urine (red or dark brown) or unusual colored stools (red or black). Unusual bruising for unknown reasons. A serious fall or if you hit your head (even if there is no bleeding).  Some medicines may interact with Eliquis and might increase your risk of bleeding or clotting while on Eliquis. To help avoid this, consult your healthcare provider or pharmacist prior to using any new prescription or non-prescription medications, including herbals, vitamins, non-steroidal anti-inflammatory drugs (NSAIDs) and supplements.  This website has more information on Eliquis (apixaban): http://www.eliquis.com/eliquis/home   Ms. Haislip, you were admitted with left sided weakness, left sided facial droop, left sided numbness, right gaze deviation, dysarthria and left field cut and left hemineglect.  Thrombectomy was performed in Interventional Radiology with improvement of your symptoms.  You are now ready to be discharged with outpatient  rehabilitation.  If you experience recurrent stroke symptoms, call 911 and go to the ED immediately.

## 2021-11-15 NOTE — Progress Notes (Signed)
[facility] for Heparin   Indication: atrial fibrillation, stroke;s/p IR re-vascularization of R ICA/MCA   Allergies  Allergen Reactions   Codeine    Hydrocodone       Vital Signs: Temp: 96.2 F (35.7 C) (02/28 0022) Temp Source: Axillary (02/28 0022) BP: 120/73 (02/28 0022) Pulse Rate: 51 (02/28 0022)  Labs: Recent Labs    11/12/21 2115 11/13/21 0600 11/13/21 1129 11/13/21 2000 11/14/21 0615 11/14/21 0917 11/14/21 1313 11/15/21 0041  HGB 15.3* 13.8  --   --  13.0  --   --  13.3  HCT 43.6 39.8  --   --  38.0  --   --  37.8  PLT 210 228  --   --  205  --   --  183  APTT 37*  --  34   < > 67*  --  47* 63*  LABPROT 14.2  --   --   --   --   --   --   --   INR 1.1  --   --   --   --   --   --   --   HEPARINUNFRC  --   --  >1.10*  --  >1.10*  --   --   --   CREATININE 0.58 0.47  --   --   --  0.70 0.66 0.66   < > = values in this interval not displayed.     Estimated Creatinine Clearance: 86.6 mL/min (by C-G formula based on SCr of 0.66 mg/dL).     Assessment: 51 y/o F transfer from [facility] with stroke s/p re-vascularization with IR. Pt is on Apxiaban PTA, last dose 2/25 PM. Pharmacy consulted for heparin.  2/28 AM update:  aPTT low   Goal of Therapy:  Heparin level 0.3-0.5 units/ml aPTT 66-84 secs Monitor platelets by anticoagulation protocol: Yes   Plan:  Inc heparin to 1450 units/hr 1000 aPTT and heparin level Daily CBC/heparin level/aPTT Monitor for bleeding  Narda Bonds, PharmD, BCPS Clinical Pharmacist Phone: [phone]

## 2021-11-15 NOTE — Discharge Summary (Addendum)
Stroke Discharge Summary  Patient ID: Hannah Zimmerman   MRN: [id number]      DOB: Jan 20, 1971  Date of Admission: 11/12/2021 Date of Discharge: 11/15/2021  Attending Physician:  Stroke, Md, MD, Stroke MD Consultant(s):    cardiology  Patient's PCP:  Kirk Ruths, MD  DISCHARGE DIAGNOSIS: Right ICA and MCA stroke Principal Problem:   Acute ischemic stroke [facility]) Active Problems:   Middle cerebral artery embolism, right   Allergies as of 11/15/2021       Reactions   Codeine    Hydrocodone         Medication List     STOP taking these medications    flecainide 100 MG tablet Commonly known as: TAMBOCOR   metoprolol tartrate 25 MG tablet Commonly known as: LOPRESSOR       TAKE these medications    acetaminophen 500 MG tablet Commonly known as: TYLENOL Take 500 mg by mouth every 12 (twelve) hours as needed for mild pain or headache.   amiodarone 400 MG tablet Commonly known as: PACERONE Take 1 tablet (400 mg total) by mouth 2 (two) times daily. Take one 400 mg tablet twice daily for 1 week, then one 400 mg tablet daily for 2 weeks, then one half 400 mg tablet  (200 mg daily) thereafter   apixaban 5 MG Tabs tablet Commonly known as: ELIQUIS Take 5 mg by mouth 2 (two) times daily.   atorvastatin 40 MG tablet Commonly known as: LIPITOR Take 1 tablet (40 mg total) by mouth at bedtime.   midodrine 5 MG tablet Commonly known as: PROAMATINE Take 3 tablets (15 mg total) by mouth 3 (three) times daily.   nicotine 21 mg/24hr patch Commonly known as: NICODERM CQ - dosed in mg/24 hours Place 1 patch (21 mg total) onto the skin daily. Start taking on: November 16, 2021   NON FORMULARY Take 1 each by mouth daily. Vitamin D + B12 gummy               Durable Medical Equipment  (From admission, onward)           Start     Ordered   11/15/21 1440  For home use only DME Cane  Once        11/15/21 1440            LABORATORY STUDIES CBC     Component Value Date/Time   WBC 8.4 11/15/2021 0041   RBC 3.73 (L) 11/15/2021 0041   HGB 13.3 11/15/2021 0041   HCT 37.8 11/15/2021 0041   PLT 183 11/15/2021 0041   MCV 101.3 (H) 11/15/2021 0041   MCH 35.7 (H) 11/15/2021 0041   MCHC 35.2 11/15/2021 0041   RDW 13.2 11/15/2021 0041   LYMPHSABS 1.3 11/13/2021 0600   MONOABS 0.6 11/13/2021 0600   EOSABS 0.1 11/13/2021 0600   BASOSABS 0.1 11/13/2021 0600   CMP    Component Value Date/Time   NA 137 11/15/2021 0041   K 4.2 11/15/2021 0041   CL 109 11/15/2021 0041   CO2 21 (L) 11/15/2021 0041   GLUCOSE 87 11/15/2021 0041   BUN 10 11/15/2021 0041   CREATININE 0.66 11/15/2021 0041   CALCIUM 8.4 (L) 11/15/2021 0041   PROT 6.2 (L) 11/12/2021 2115   ALBUMIN 3.6 11/12/2021 2115   AST 28 11/12/2021 2115   ALT 51 (H) 11/12/2021 2115   ALKPHOS 86 11/12/2021 2115   BILITOT 0.7 11/12/2021 2115   GFRNONAA >60 11/15/2021 0041  COAGS Lab Results  Component Value Date   INR 1.1 11/12/2021   Lipid Panel    Component Value Date/Time   CHOL 137 11/13/2021 0600   TRIG 45 11/13/2021 0600   HDL 41 11/13/2021 0600   CHOLHDL 3.3 11/13/2021 0600   VLDL 9 11/13/2021 0600   LDLCALC 87 11/13/2021 0600   HgbA1C  Lab Results  Component Value Date   HGBA1C 5.1 11/13/2021   Urinalysis No results found for: COLORURINE, APPEARANCEUR, LABSPEC, PHURINE, GLUCOSEU, HGBUR, BILIRUBINUR, KETONESUR, PROTEINUR, UROBILINOGEN, NITRITE, LEUKOCYTESUR Urine Drug Screen No results found for: LABOPIA, COCAINSCRNUR, LABBENZ, AMPHETMU, THCU, LABBARB  Alcohol Level    Component Value Date/Time   ETH 12 (H) 11/12/2021 2139     SIGNIFICANT DIAGNOSTIC STUDIES MR BRAIN WO CONTRAST  Result Date: 11/13/2021 CLINICAL DATA:  Stroke follow-up EXAM: MRI HEAD WITHOUT CONTRAST TECHNIQUE: Multiplanar, multiecho pulse sequences of the brain and surrounding structures were obtained without intravenous contrast. COMPARISON:  CTA from yesterday FINDINGS: Brain: Restricted  diffusion at the right striatum with mild petechial hemorrhage. Borderline increased diffusion signal at the adjacent right frontal and insular cortex, but no swelling or hypointensity visualized on the ADC map. No hydrocephalus, mass, or collection. Vascular: Normal flow voids Skull and upper cervical spine: Normal marrow signal Sinuses/Orbits: Negative Other: Intermittent motion artifact. 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 Electronically Signed   By: Jorje Guild M.D.   On: 11/13/2021 12:58   IR CT Head Ltd  Result Date: 11/15/2021 INDICATION: New onset right gaze deviation, left hemiplegia, and left-sided neglect. 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 EXAM: 1. EMERGENT LARGE VESSEL OCCLUSION THROMBOLYSIS (anterior CIRCULATION) COMPARISON:  CT angiogram of the head and neck of 11/12/2021. MEDICATIONS: Ancef 2 g IV antibiotic was administered within 1 hour of the procedure. ANESTHESIA/SEDATION: General anesthesia. CONTRAST:  Omnipaque 300 approximately 70 mL. FLUOROSCOPY TIME:  Fluoroscopy Time: 18 minutes 36 seconds (1101 mGy). COMPLICATIONS: None immediate. TECHNIQUE: Following a full explanation of the procedure along with the potential associated complications, an informed witnessed consent was obtained. The risks of intracranial hemorrhage of 10%, worsening neurological deficit, ventilator dependency, death and inability to revascularize were all reviewed in detail with the patient's daughter. The patient was then put under general anesthesia by the Department of Anesthesiology at [facility]. The right groin was prepped and draped in the usual sterile fashion. Thereafter using modified Seldinger technique, transfemoral access into the right common femoral artery was obtained without difficulty. Over a 0.035 inch guidewire an 8 French 25 cm pinnacle sheath was inserted. Through this,  and also over a 0.035 inch guidewire a 5 [place] JB 1 catheter was advanced to the aortic arch region and selectively positioned in the right common carotid artery and the left common carotid artery. FINDINGS: The right common carotid arteriogram demonstrates the right external carotid artery and its major branches to be widely patent. The right internal carotid artery at the bulb to the mid cervical region demonstrates slow ascent of contrast to complete occlusion at the distal horizontal petrous segment. No reconstitution of the right internal carotid artery distally is noted from the ipsilateral ophthalmic artery. PROCEDURE: The diagnostic JB 1 catheter in the right common carotid artery was then exchanged over a 0.035 inch 300 cm Rosen exchange guidewire for an 087 balloon guide catheter which had been prepped with 50% contrast and 50% heparinized saline infusion. The balloon guide was advanced  to the mid cervical right ICA. The guidewire was removed. Resistance to aspiration was noted. The balloon guide was then connected to aspiration device. Through the balloon guide catheter, and over a 0.014 inch standard Synchro micro guidewire, a 132 cm 071 intermediary catheter was advanced to the proximal horizontal petrous ICA. Aspiration was then continued at the hub of the balloon guide catheter with a 20 mL syringe, and the aspiration device at the hub of the intermediary catheter for approximately 2 minutes. The intermediary catheter was then gently retrieved as constant aspiration was continued. String like clots were noted in the aspiration receptor. Control arteriogram performed through the balloon guide catheter in the proximal right internal carotid artery continued to demonstrate occlusion of the right internal carotid artery in the distal cavernous segment. A combination of the 132 cm 071 Support guide catheter with an 021 Trevo microcatheter was advanced over a 0.014 inch standard Synchro micro guidewire  with a moderate J configuration to the petrous right ICA. The micro guidewire was then gently manipulated into the right middle cerebral artery proximal superior division followed by the microcatheter. The guidewire was removed. Good aspiration obtained from the hub of the microcatheter. A gentle control arteriogram performed through the microcatheter demonstrated safe positioning of tip of the microcatheter which was then connected to continuous heparinized saline infusion. A 6.5 mm x 45 mm Embotrap retrieval device was then advanced to the distal end of the microcatheter. The O ring on the delivery microcatheter was then loosened. With slight forward gentle traction with the right hand on the delivery micro guidewire with the left hand the delivery microcatheter was retrieved deploying the retrieval device which extended into the entire MCA, and also the supraclinoid left ICA. With proximal flow arrest in the proximal right internal carotid artery, and constant aspiration at the hub of the intermediary catheter which had been advanced into the supraclinoid left ICA, for 2 minutes, the combination of the retrieval device, the microcatheter catheter, and intermediary catheter were retrieved and removed. Copious amounts of clot were seen entangled in the retrieval device and also in the aspirate. Following reversal of flow arrest, control arteriogram through the balloon guide in the proximal right internal carotid artery now demonstrated complete revascularization of the internal carotid artery extra cranially and intracranially, the supraclinoid right ICA and the right middle cerebral artery achieving a TICI 3 revascularization. Flash flow was noted in the right to the A1 segment. Moderate spasm in the right internal carotid artery responded appropriately to 3 aliquots of 25 mcg of nitroglycerin intra-arterially. A final control arteriogram performed through the balloon guide catheter in the right common carotid  artery demonstrated complete revascularization with patency of the right internal carotid artery extra cranially and intracranially, with a TICI 3 revascularization of the right middle cerebral artery. Also noted now was opacification of the right anterior cerebral artery into the A2 segment and distally. The balloon guide was removed. A 5 French diagnostic catheter was then advanced into the left common carotid artery. Arteriograms performed through the 5 French diagnostic catheter and the left common carotid artery demonstrated the left external carotid artery and its major branches to be widely patent. The left internal carotid artery extra cranially and intracranially demonstrate wide patency. The left middle and the left anterior cerebral artery opacify into the capillary and venous phases. Cross-filling via the anterior communicating artery of the right anterior cerebral artery A2 segment is also demonstrated. The 8 French Pinnacle sheath was removed with successful hemostasis  at the right groin puncture site with an 8 French Angio-Seal closure device. Distal pulses remained Dopplerable in both feet unchanged. A CT of the brain demonstrated no evidence of intracranial hemorrhage, or mass effect. Patient's general anesthesia was then reversed, and patient slowly recovered to where she could move her right arm and leg spontaneously and to command, and also bend her left knee. No significant motion was noted in the left upper extremity. She was able to obey simple commands appropriately. She was then transferred to the neuro ICU for post revascularization care. IMPRESSION: Status post endovascular complete revascularization of the right internal carotid artery extra cranially and intracranially and of the right middle cerebral artery achieving a TICI 3 revascularization, using 1 pass with contact aspiration, and 1 pass with contact aspiration, and 6.5 mm x 45 mm Embotrap retrieval device. PLAN: Follow-up as per  referring MD. Electronically Signed   By: Luanne Bras M.D.   On: 11/15/2021 08:59   ECHOCARDIOGRAM COMPLETE  Result Date: 11/13/2021    ECHOCARDIOGRAM REPORT   Patient Name:   Hannah Zimmerman Date of Exam: 11/13/2021 Medical Rec #:  [id number]      Height: Accession #:    [id number]     Weight:       147.7 lb Date of Birth:  1970/12/16     BSA:          1.650 m Patient Age:    51 years       BP:           99/75 mmHg Patient Gender: F              HR:           48 bpm. Exam Location:  Inpatient Procedure: 2D Echo, Cardiac Doppler, Color Doppler, Strain Analysis and 3D Echo Indications:    CVA  History:        Patient has no prior history of Echocardiogram examinations.  Sonographer:    Luisa Hart RDCS Referring Phys: [id number] ASHISH ARORA IMPRESSIONS  1. Left ventricular ejection fraction, by estimation, is 60 to 65%. The left ventricle has normal function. The left ventricle has no regional wall motion abnormalities. There is severe concentric left ventricular hypertrophy. Indeterminate diastolic filling due to E-A fusion. Elevated left ventricular end-diastolic pressure.  2. Right ventricular systolic function is normal. The right ventricular size is moderately enlarged.  3. Right atrial size was moderately dilated.  4. Left atrial size was severely dilated.  5. The mitral valve is normal in structure. Mild mitral valve regurgitation. No evidence of mitral stenosis.  6. There is a soft tissue density on the noncoronary cusp extending into the LVOT. 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 Differential dx includes endocarditis (in right clinical setting) or valvular myxmoa. Images from TEE done 11/08/21 demonstrate the  same soft tissue density (images 34-37 and 42 on TEE). There is also a prominent nodule of Arantius on the [facility] that is normal finding.  7. Recommend cMRI for further tissue evaluation of the aortic valve mass. FINDINGS  Left Ventricle: Left ventricular ejection fraction, by estimation, is 60  to 65%. The left ventricle has normal function. The left ventricle has no regional wall motion abnormalities. Global longitudinal strain performed but not reported based on interpreter judgement due to suboptimal tracking. The left ventricular internal cavity size was normal in size. There is severe concentric left ventricular hypertrophy. Indeterminate diastolic filling due to E-A fusion. Elevated left ventricular end-diastolic pressure. Right Ventricle: The right ventricular  size is moderately enlarged. No increase in right ventricular wall thickness. Right ventricular systolic function is normal. Left Atrium: Left atrial size was severely dilated. Right Atrium: Right atrial size was moderately dilated. Pericardium: There is no evidence of pericardial effusion. Mitral Valve: The mitral valve is normal in structure. Mild mitral annular calcification. Mild mitral valve regurgitation. No evidence of mitral valve stenosis. Tricuspid Valve: The tricuspid valve is normal in structure. Tricuspid valve regurgitation is mild . No evidence of tricuspid stenosis. Aortic Valve: There is a soft tissue density on the noncoronary cusp extending into the LVOT. 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 Differential dx includes endocarditis (in right clinical setting) or valvular myxmoa. Images from TEE done 11/08/21 demonstrate the same soft tissue density (images 34-37 and 42 on TEE). There is also a prominent nodule of Arantius on the [facility] that is normal finding. The aortic valve is tricuspid. Aortic valve regurgitation is not visualized. Aortic valve sclerosis is present, with no evidence of aortic valve stenosis. Aortic valve mean gradient measures 3.0 mmHg. Aortic valve peak gradient measures 6.1 mmHg. Aortic valve area, by VTI measures 2.44 cm. Pulmonic Valve: The pulmonic valve was normal in structure. Pulmonic valve regurgitation is trivial. No evidence of pulmonic stenosis. Aorta: The aortic root is normal in size and  structure. Venous: The inferior vena cava was not well visualized. IAS/Shunts: No atrial level shunt detected by color flow Doppler.  LEFT VENTRICLE PLAX 2D LVIDd:         4.30 cm     Diastology LVIDs:         2.05 cm     LV e' medial:    4.33 cm/s LV PW:         1.50 cm     LV E/e' medial:  20.4 LV IVS:        1.40 cm     LV e' lateral:   5.46 cm/s LVOT diam:     2.10 cm     LV E/e' lateral: 16.2 LV SV:         69 LV SV Index:   42 LVOT Area:     3.46 cm  LV Volumes (MOD) LV vol d, MOD A4C: 48.0 ml LV vol s, MOD A4C: 23.8 ml LV SV MOD A4C:     48.0 ml RIGHT VENTRICLE RV Basal diam:  5.50 cm RV Mid diam:    3.20 cm LEFT ATRIUM           Index        RIGHT ATRIUM           Index LA Vol (A2C): 92.9 ml 56.31 ml/m  RA Area:     20.40 cm                                    RA Volume:   63.80 ml  38.67 ml/m  AORTIC VALVE                    PULMONIC VALVE AV Area (Vmax):    2.34 cm     PV Vmax:          0.95 m/s AV Area (Vmean):   2.21 cm     PV Vmean:         61.700 cm/s AV Area (VTI):     2.44 cm     PV VTI:  0.192 m AV Vmax:           123.50 cm/s  PV Peak grad:     3.6 mmHg AV Vmean:          81.400 cm/s  PV Mean grad:     2.0 mmHg AV VTI:            0.280 m      PR End Diast Vel: 17.47 msec AV Peak Grad:      6.1 mmHg AV Mean Grad:      3.0 mmHg LVOT Vmax:         83.40 cm/s LVOT Vmean:        51.900 cm/s LVOT VTI:          0.198 m LVOT/AV VTI ratio: 0.71  AORTA Ao Root diam: 2.60 cm Ao Asc diam:  2.70 cm MITRAL VALVE                  TRICUSPID VALVE MV Area (PHT): 3.89 cm       TR Peak grad:   67.2 mmHg MV Decel Time: 195 msec       TR Vmax:        410.00 cm/s MR Peak grad:    73.6 mmHg MR Vmax:         429.00 cm/s  SHUNTS MR PISA:         1.01 cm     Systemic VTI:  0.20 m MR PISA Eff ROA: 7 mm        Systemic Diam: 2.10 cm MR PISA Radius:  0.40 cm MV E velocity: 88.30 cm/s MV A velocity: 28.10 cm/s MV E/A ratio:  3.14 Fransico Him MD Electronically signed by Fransico Him MD Signature Date/Time:  11/13/2021/4:48:07 PM    Final    IR PERCUTANEOUS ART THROMBECTOMY/INFUSION INTRACRANIAL INC DIAG ANGIO  Result Date: 11/15/2021 INDICATION: New onset right gaze deviation, left hemiplegia, and left-sided neglect. 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 EXAM: 1. EMERGENT LARGE VESSEL OCCLUSION THROMBOLYSIS (anterior CIRCULATION) COMPARISON:  CT angiogram of the head and neck of 11/12/2021. MEDICATIONS: Ancef 2 g IV antibiotic was administered within 1 hour of the procedure. ANESTHESIA/SEDATION: General anesthesia. CONTRAST:  Omnipaque 300 approximately 70 mL. FLUOROSCOPY TIME:  Fluoroscopy Time: 18 minutes 36 seconds (1101 mGy). COMPLICATIONS: None immediate. TECHNIQUE: Following a full explanation of the procedure along with the potential associated complications, an informed witnessed consent was obtained. The risks of intracranial hemorrhage of 10%, worsening neurological deficit, ventilator dependency, death and inability to revascularize were all reviewed in detail with the patient's daughter. The patient was then put under general anesthesia by the Department of Anesthesiology at [facility]. The right groin was prepped and draped in the usual sterile fashion. Thereafter using modified Seldinger technique, transfemoral access into the right common femoral artery was obtained without difficulty. Over a 0.035 inch guidewire an 8 French 25 cm pinnacle sheath was inserted. Through this, and also over a 0.035 inch guidewire a 5 [place] JB 1 catheter was advanced to the aortic arch region and selectively positioned in the right common carotid artery and the left common carotid artery. FINDINGS: The right common carotid arteriogram demonstrates the right external carotid artery and its major branches to be widely patent. The right internal carotid artery at the bulb to the mid cervical region demonstrates slow ascent of contrast to  complete occlusion at the distal horizontal petrous  segment. No reconstitution of the right internal carotid artery distally is noted from the ipsilateral ophthalmic artery. PROCEDURE: The diagnostic JB 1 catheter in the right common carotid artery was then exchanged over a 0.035 inch 300 cm Rosen exchange guidewire for an 087 balloon guide catheter which had been prepped with 50% contrast and 50% heparinized saline infusion. The balloon guide was advanced to the mid cervical right ICA. The guidewire was removed. Resistance to aspiration was noted. The balloon guide was then connected to aspiration device. Through the balloon guide catheter, and over a 0.014 inch standard Synchro micro guidewire, a 132 cm 071 intermediary catheter was advanced to the proximal horizontal petrous ICA. Aspiration was then continued at the hub of the balloon guide catheter with a 20 mL syringe, and the aspiration device at the hub of the intermediary catheter for approximately 2 minutes. The intermediary catheter was then gently retrieved as constant aspiration was continued. String like clots were noted in the aspiration receptor. Control arteriogram performed through the balloon guide catheter in the proximal right internal carotid artery continued to demonstrate occlusion of the right internal carotid artery in the distal cavernous segment. A combination of the 132 cm 071 Support guide catheter with an 021 Trevo microcatheter was advanced over a 0.014 inch standard Synchro micro guidewire with a moderate J configuration to the petrous right ICA. The micro guidewire was then gently manipulated into the right middle cerebral artery proximal superior division followed by the microcatheter. The guidewire was removed. Good aspiration obtained from the hub of the microcatheter. A gentle control arteriogram performed through the microcatheter demonstrated safe positioning of tip of the microcatheter which was then connected to continuous  heparinized saline infusion. A 6.5 mm x 45 mm Embotrap retrieval device was then advanced to the distal end of the microcatheter. The O ring on the delivery microcatheter was then loosened. With slight forward gentle traction with the right hand on the delivery micro guidewire with the left hand the delivery microcatheter was retrieved deploying the retrieval device which extended into the entire MCA, and also the supraclinoid left ICA. With proximal flow arrest in the proximal right internal carotid artery, and constant aspiration at the hub of the intermediary catheter which had been advanced into the supraclinoid left ICA, for 2 minutes, the combination of the retrieval device, the microcatheter catheter, and intermediary catheter were retrieved and removed. Copious amounts of clot were seen entangled in the retrieval device and also in the aspirate. Following reversal of flow arrest, control arteriogram through the balloon guide in the proximal right internal carotid artery now demonstrated complete revascularization of the internal carotid artery extra cranially and intracranially, the supraclinoid right ICA and the right middle cerebral artery achieving a TICI 3 revascularization. Flash flow was noted in the right to the A1 segment. Moderate spasm in the right internal carotid artery responded appropriately to 3 aliquots of 25 mcg of nitroglycerin intra-arterially. A final control arteriogram performed through the balloon guide catheter in the right common carotid artery demonstrated complete revascularization with patency of the right internal carotid artery extra cranially and intracranially, with a TICI 3 revascularization of the right middle cerebral artery. Also noted now was opacification of the right anterior cerebral artery into the A2 segment and distally. The balloon guide was removed. A 5 French diagnostic catheter was then advanced into the left common carotid artery. Arteriograms performed through  the 5 French diagnostic catheter and the left common carotid artery demonstrated the left external carotid  artery and its major branches to be widely patent. The left internal carotid artery extra cranially and intracranially demonstrate wide patency. The left middle and the left anterior cerebral artery opacify into the capillary and venous phases. Cross-filling via the anterior communicating artery of the right anterior cerebral artery A2 segment is also demonstrated. The 8 French Pinnacle sheath was removed with successful hemostasis at the right groin puncture site with an 8 French Angio-Seal closure device. Distal pulses remained Dopplerable in both feet unchanged. A CT of the brain demonstrated no evidence of intracranial hemorrhage, or mass effect. Patient's general anesthesia was then reversed, and patient slowly recovered to where she could move her right arm and leg spontaneously and to command, and also bend her left knee. No significant motion was noted in the left upper extremity. She was able to obey simple commands appropriately. She was then transferred to the neuro ICU for post revascularization care. IMPRESSION: Status post endovascular complete revascularization of the right internal carotid artery extra cranially and intracranially and of the right middle cerebral artery achieving a TICI 3 revascularization, using 1 pass with contact aspiration, and 1 pass with contact aspiration, and 6.5 mm x 45 mm Embotrap retrieval device. PLAN: Follow-up as per referring MD. Electronically Signed   By: Luanne Bras M.D.   On: 11/15/2021 08:59   CT HEAD CODE STROKE WO CONTRAST  Result Date: 11/12/2021 CLINICAL DATA:  Code stroke.  Left-sided weakness EXAM: CT HEAD WITHOUT CONTRAST TECHNIQUE: Contiguous axial images were obtained from the base of the skull through the vertex without intravenous contrast. RADIATION DOSE REDUCTION: This exam was performed according to the departmental dose-optimization  program which includes automated exposure control, adjustment of the mA and/or kV according to patient size and/or use of iterative reconstruction technique. COMPARISON:  None. FINDINGS: Brain: No definite acute cortical hypodensity. No hemorrhage, mass, mass effect, or midline shift. No hydrocephalus or extra-axial collection. Vascular: Dense right MCA (series 3, image 10), concerning for thrombosis. Skull: Normal. Negative for fracture or focal lesion. Sinuses/Orbits: Mucosal thickening in the ethmoid air cells. Otherwise negative. Other: The mastoids are well aerated. ASPECTS [facility] Stroke Program Early CT Score) - Ganglionic level infarction (caudate, lentiform nuclei, internal capsule, insula, M1-M3 cortex): 7 - Supraganglionic infarction (M4-M6 cortex): 3 Total score (0-10 with 10 being normal): 9 IMPRESSION: 1. Dense right MCA, concerning for thrombosis, without definite cortical hypodensity. 2. ASPECTS is 10 Code stroke imaging results were communicated on 11/12/2021 at 9:37 pm to provider [facility] via telephone, who verbally acknowledged these results. Electronically Signed   By: Merilyn Baba M.D.   On: 11/12/2021 21:37   CT ANGIO HEAD NECK W WO CM W PERF (CODE STROKE)  Addendum Date: 11/12/2021   ADDENDUM REPORT: 11/12/2021 22:44 ADDENDUM: CT PERFUSION CBF<30%: 67 ml Tmax>6.0s: 180 Mismatch volume: 113 ml Mismatch ratio: 2.7 Infarct location: Right MCA territory Electronically Signed   By: Merilyn Baba M.D.   On: 11/12/2021 22:44   Result Date: 11/12/2021 CLINICAL DATA:  Left-sided weakness, slurred speech EXAM: CT ANGIOGRAPHY HEAD AND NECK TECHNIQUE: Multidetector CT imaging of the head and neck was performed using the standard protocol during bolus administration of intravenous contrast. Multiplanar CT image reconstructions and MIPs were obtained to evaluate the vascular anatomy. Carotid stenosis measurements (when applicable) are obtained utilizing NASCET criteria, using the distal internal  carotid diameter as the denominator. RADIATION DOSE REDUCTION: This exam was performed according to the departmental dose-optimization program which includes automated exposure control, adjustment of the mA  and/or kV according to patient size and/or use of iterative reconstruction technique. CONTRAST:  161mL OMNIPAQUE IOHEXOL 350 MG/ML SOLN COMPARISON:  No prior CTA, correlation is made with CT head 11/12/2021. FINDINGS: CT HEAD FINDINGS For noncontrast findings, please see same day CT head. CTA NECK FINDINGS Aortic arch: Standard branching. Imaged portion shows no evidence of aneurysm or dissection. No significant stenosis of the major arch vessel origins. Right carotid system: Complete non opacification of the right internal carotid artery in the proximal to mid right ICA (series 7, image 155). The ICA remains non-opacified through its extracranial and intracranial portions. Left carotid system: No evidence of dissection, stenosis (50% or greater) or occlusion. Vertebral arteries: Codominant. No evidence of dissection, stenosis (50% or greater) or occlusion. Skeleton: No acute osseous abnormality. Degenerative changes in the cervical spine. Other neck: Negative. Upper chest: No focal pulmonary opacity or pleural effusion. Review of the MIP images confirms the above findings CTA HEAD Anterior circulation: Complete non opacification of the right ICA to the terminus. The left ICA is patent, without significant stenosis. A1 segments patent, with opacification of the right A1 secondary to collateral flow from the left A1. Normal anterior communicating artery. Anterior cerebral arteries are patent to their distal aspects. Non opacification of the right MCA from the terminus through the M1 segment and bifurcation, there is some distal reconstitution, with contrast noted in a right M2 and some M3 branches (series 7, image). No left M1 stenosis or occlusion. Normal left MCA bifurcation. Distal left MCA branches are  perfused. Posterior circulation: Vertebral arteries patent to the vertebrobasilar junction without stenosis. Posterior inferior cerebral arteries patent bilaterally. Basilar patent to its distal aspect. Superior cerebellar arteries patent bilaterally. Patent P1 segments. PCAs perfused to their distal aspects without stenosis. The bilateral posterior communicating arteries are not visualized. Venous sinuses: As permitted by contrast timing, patent. Anatomic variants: None significant. Review of the MIP images confirms the above findings IMPRESSION: 1. Complete non opacification of the proximal to mid right ICA, which extends through the intracranial portions to the terminus. The right MCA does not opacify proximally, with some contrast in the distal MCA vessels. This is consistent with a dense MCA sign seen on the same-day head CT. No other large vessel occlusion or significant stenosis. 2. No hemodynamically significant stenosis in the vertebral arteries, left carotid system, or proximal right carotid system. Code stroke imaging results were communicated on 11/12/2021 at 10:18 pm to provider [facility] via telephone, who verbally acknowledged these results. Electronically Signed: By: Merilyn Baba M.D. On: 11/12/2021 22:24   IR ANGIO EXTRACRAN SEL COM CAROTID INNOMINATE UNI L MOD SED  Result Date: 11/15/2021 INDICATION: New onset right gaze deviation, left hemiplegia, and left-sided neglect. 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 EXAM: 1. EMERGENT LARGE VESSEL OCCLUSION THROMBOLYSIS (anterior CIRCULATION) COMPARISON:  CT angiogram of the head and neck of 11/12/2021. MEDICATIONS: Ancef 2 g IV antibiotic was administered within 1 hour of the procedure. ANESTHESIA/SEDATION: General anesthesia. CONTRAST:  Omnipaque 300 approximately 70 mL. FLUOROSCOPY TIME:  Fluoroscopy Time: 18 minutes 36 seconds (1101 mGy). COMPLICATIONS: None immediate. TECHNIQUE:  Following a full explanation of the procedure along with the potential associated complications, an informed witnessed consent was obtained. The risks of intracranial hemorrhage of 10%, worsening neurological deficit, ventilator dependency, death and inability to revascularize were all reviewed in detail with the patient's daughter. The patient was then put under general anesthesia by the Department  of Anesthesiology at [facility]. The right groin was prepped and draped in the usual sterile fashion. Thereafter using modified Seldinger technique, transfemoral access into the right common femoral artery was obtained without difficulty. Over a 0.035 inch guidewire an 8 French 25 cm pinnacle sheath was inserted. Through this, and also over a 0.035 inch guidewire a 5 [place] JB 1 catheter was advanced to the aortic arch region and selectively positioned in the right common carotid artery and the left common carotid artery. FINDINGS: The right common carotid arteriogram demonstrates the right external carotid artery and its major branches to be widely patent. The right internal carotid artery at the bulb to the mid cervical region demonstrates slow ascent of contrast to complete occlusion at the distal horizontal petrous segment. No reconstitution of the right internal carotid artery distally is noted from the ipsilateral ophthalmic artery. PROCEDURE: The diagnostic JB 1 catheter in the right common carotid artery was then exchanged over a 0.035 inch 300 cm Rosen exchange guidewire for an 087 balloon guide catheter which had been prepped with 50% contrast and 50% heparinized saline infusion. The balloon guide was advanced to the mid cervical right ICA. The guidewire was removed. Resistance to aspiration was noted. The balloon guide was then connected to aspiration device. Through the balloon guide catheter, and over a 0.014 inch standard Synchro micro guidewire, a 132 cm 071 intermediary catheter was advanced to  the proximal horizontal petrous ICA. Aspiration was then continued at the hub of the balloon guide catheter with a 20 mL syringe, and the aspiration device at the hub of the intermediary catheter for approximately 2 minutes. The intermediary catheter was then gently retrieved as constant aspiration was continued. String like clots were noted in the aspiration receptor. Control arteriogram performed through the balloon guide catheter in the proximal right internal carotid artery continued to demonstrate occlusion of the right internal carotid artery in the distal cavernous segment. A combination of the 132 cm 071 Support guide catheter with an 021 Trevo microcatheter was advanced over a 0.014 inch standard Synchro micro guidewire with a moderate J configuration to the petrous right ICA. The micro guidewire was then gently manipulated into the right middle cerebral artery proximal superior division followed by the microcatheter. The guidewire was removed. Good aspiration obtained from the hub of the microcatheter. A gentle control arteriogram performed through the microcatheter demonstrated safe positioning of tip of the microcatheter which was then connected to continuous heparinized saline infusion. A 6.5 mm x 45 mm Embotrap retrieval device was then advanced to the distal end of the microcatheter. The O ring on the delivery microcatheter was then loosened. With slight forward gentle traction with the right hand on the delivery micro guidewire with the left hand the delivery microcatheter was retrieved deploying the retrieval device which extended into the entire MCA, and also the supraclinoid left ICA. With proximal flow arrest in the proximal right internal carotid artery, and constant aspiration at the hub of the intermediary catheter which had been advanced into the supraclinoid left ICA, for 2 minutes, the combination of the retrieval device, the microcatheter catheter, and intermediary catheter were retrieved  and removed. Copious amounts of clot were seen entangled in the retrieval device and also in the aspirate. Following reversal of flow arrest, control arteriogram through the balloon guide in the proximal right internal carotid artery now demonstrated complete revascularization of the internal carotid artery extra cranially and intracranially, the supraclinoid right ICA and the right middle cerebral artery  achieving a TICI 3 revascularization. Flash flow was noted in the right to the A1 segment. Moderate spasm in the right internal carotid artery responded appropriately to 3 aliquots of 25 mcg of nitroglycerin intra-arterially. A final control arteriogram performed through the balloon guide catheter in the right common carotid artery demonstrated complete revascularization with patency of the right internal carotid artery extra cranially and intracranially, with a TICI 3 revascularization of the right middle cerebral artery. Also noted now was opacification of the right anterior cerebral artery into the A2 segment and distally. The balloon guide was removed. A 5 French diagnostic catheter was then advanced into the left common carotid artery. Arteriograms performed through the 5 French diagnostic catheter and the left common carotid artery demonstrated the left external carotid artery and its major branches to be widely patent. The left internal carotid artery extra cranially and intracranially demonstrate wide patency. The left middle and the left anterior cerebral artery opacify into the capillary and venous phases. Cross-filling via the anterior communicating artery of the right anterior cerebral artery A2 segment is also demonstrated. The 8 French Pinnacle sheath was removed with successful hemostasis at the right groin puncture site with an 8 French Angio-Seal closure device. Distal pulses remained Dopplerable in both feet unchanged. A CT of the brain demonstrated no evidence of intracranial hemorrhage, or mass  effect. Patient's general anesthesia was then reversed, and patient slowly recovered to where she could move her right arm and leg spontaneously and to command, and also bend her left knee. No significant motion was noted in the left upper extremity. She was able to obey simple commands appropriately. She was then transferred to the neuro ICU for post revascularization care. IMPRESSION: Status post endovascular complete revascularization of the right internal carotid artery extra cranially and intracranially and of the right middle cerebral artery achieving a TICI 3 revascularization, using 1 pass with contact aspiration, and 1 pass with contact aspiration, and 6.5 mm x 45 mm Embotrap retrieval device. PLAN: Follow-up as per referring MD. Electronically Signed   By: Luanne Bras M.D.   On: 11/15/2021 08:59      HISTORY OF PRESENT ILLNESS Patient with a history of atrial fibrillation on Eliquis presented with left hemiplegia, left facial droop, right gaze deviation, left hemineglect and left sided numbness   HOSPITAL COURSE Upon admission, patient was taken to IR for thrombectomy, with TICI 3 flow to the right MCA and ICA achieved.  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  Amiodarone was started to manage atrial fibrillation, and patient will now be discharged with outpatient PT/OT follow up.  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 CTA head & neck Complete non opacification of the proximal to mid right ICA, which extends through the intracranial portions to the  terminus. The right MCA does not opacify proximally, with some contrast in the distal MCA vessels.  CT perfusion CBF less than 30 of 67 cc and Tmax greater than 6 of 180 cc with a mismatch volume of 130 mL with mismatch ratio 2.7 in the right MCA territory Post IR CT  No ICH or mass effect. 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 2D Echo EF 60-65%, severe concentric LVH, soft tissue density on noncoronary cusp of aortic valve, severely dilated left atrium LDL 87 HgbA1c 5.1 VTE prophylaxis - IV heparin, SCDs Eliquis (apixaban) daily prior to admission, now on heparin IV.  Stopped heparin today, start eliquis as her insurance did not cover other DOACs.    Atrial Fibrillation- cardiology consulted, appreciate assistance  Previously on eliquis, flecainide, metoprolol  S/p ablation 4/21 and 11/21 TEE guided DCCV on 11/08/21 Cardiology initaited amiodarone  severe LA enlargement and 15% LGE by cardiac MRI.   Soft tissue density on aortic valve soft tissue density on noncoronary cusp of aortic valve seen on 2D echo Discussed with cardiology, further workup pending   Hypertension Home meds:  lopressor 25mg  BID BP 120-140 Start midodrine to help with hypotension   Hyperlipidemia Home meds:  None LDL 87, goal < 70 Con't Atorvastatin 40mg       Other Stroke Risk Factors Cigarette smoker, advised to stop smoking Nicotine patch ordered ETOH use, alcohol level 12, advised to drink no more than 1 drink(s) a day  RN Pressure Injury Documentation:     DISCHARGE EXAM Blood pressure 119/85, pulse (!) 54, temperature 98.1 F (36.7 C), temperature source Oral, resp. rate 16, height 5\' 4"  (1.626 m), weight 81 kg, SpO2 92 %.  Constitutional: Appears well-developed and well-nourished.  Cardiovascular: Normal rate and regular rhythm.  Respiratory: Effort normal, non-labored breathing   Neuro: Mental Status: Patient is awake, alert, oriented to person, place,  month, year, and situation. Patient is able to give a clear and coherent history. No signs of aphasia or neglect Cranial Nerves: II: Visual Fields are full. Pupils are equal, round, and reactive to light.   III,IV, VI: EOMI without ptosis or diploplia.  V: Facial sensation is symmetric to temperature VII:  Left facial droop, left nasolabial fold flattening VIII: Hearing is intact to voice X: Palate elevates symmetrically XI: Shoulder shrug is symmetric. XII: Tongue protrudes midline without atrophy or fasciculations.  Motor: Tone is normal. Bulk is normal. Mild drift noted on the left LUE 4/5          RUE 5/5 LLE 4/5           RLE 5/5 Sensory: Sensation is symmetric to light touch and temperature in the arms and legs. No extinction to DSS present.  Cerebellar: FNF are intact bilaterally Gait- deferred  Discharge Diet       Diet   Diet heart healthy/carb modified Room service appropriate? Yes; Fluid consistency: Thin   liquids  DISCHARGE PLAN Disposition:  to home with outpatient PT/OT Eliquis twice daily  for secondary stroke prevention. Ongoing stroke risk factor control by Primary Care Physician at time of discharge Follow-up PCP Kirk Ruths, MD in 2 weeks. Follow-up in [facility] in 4 weeks, office to schedule an appointment.   35 minutes were spent preparing discharge.  [place] , MSN, AGACNP-BC [facility] See Amion for schedule and pager information 11/15/2021 3:58 PM  ATTENDING ATTESTATION:  Dr. Reeves Forth evaluated pt independently, reviewed imaging, chart, labs. Discussed and formulated plan with the APP. Please see APP note above for details.    Eliana Lueth,MD

## 2021-11-16 ENCOUNTER — Encounter: Payer: Self-pay | Admitting: Cardiology

## 2021-11-16 ENCOUNTER — Other Ambulatory Visit (HOSPITAL_COMMUNITY): Payer: Self-pay

## 2021-11-18 ENCOUNTER — Ambulatory Visit: Payer: Managed Care, Other (non HMO)

## 2021-11-18 ENCOUNTER — Other Ambulatory Visit: Payer: Self-pay

## 2021-11-18 ENCOUNTER — Ambulatory Visit: Payer: Managed Care, Other (non HMO) | Attending: Nurse Practitioner

## 2021-11-18 VITALS — BP 134/84 | HR 50 | Resp 17 | Ht 64.0 in | Wt 169.0 lb

## 2021-11-18 DIAGNOSIS — R278 Other lack of coordination: Secondary | ICD-10-CM | POA: Diagnosis present

## 2021-11-18 DIAGNOSIS — R269 Unspecified abnormalities of gait and mobility: Secondary | ICD-10-CM | POA: Insufficient documentation

## 2021-11-18 DIAGNOSIS — R262 Difficulty in walking, not elsewhere classified: Secondary | ICD-10-CM | POA: Diagnosis present

## 2021-11-18 DIAGNOSIS — M6281 Muscle weakness (generalized): Secondary | ICD-10-CM

## 2021-11-18 DIAGNOSIS — R2681 Unsteadiness on feet: Secondary | ICD-10-CM | POA: Diagnosis present

## 2021-11-18 NOTE — Therapy (Signed)
[place] MAIN [facility] [address] [place], [place], [address] Phone: [phone]   Fax:  [phone]  Physical Therapy Evaluation  Patient Details  Name: Hannah Zimmerman MRN: [id number] Date of Birth: [birth year] Referring Provider (PT): Dr. Frazier Richards   Encounter Date: 11/18/2021   PT End of Session - 11/18/21 1142     Visit Number 1    Number of Visits 25    Date for PT Re-Evaluation 02/10/22    Authorization Type [employer] Managed - 30 combined visit (PT/OT/Chiro)    Authorization Time Period Initial Cert period - 7/0/1779- 02/10/2022    Progress Note Due on Visit 10    PT Start Time 0803    PT Stop Time 0905    PT Time Calculation (min) 62 min    Equipment Utilized During Treatment Gait belt    Activity Tolerance Patient tolerated treatment well    Behavior During Therapy [facility] for tasks assessed/performed             Past Medical History:  Diagnosis Date   (HFpEF) heart failure with preserved ejection fraction ([place])    a. 07/2019 Echo: EF 60-65%, mod LVH. Sev dil LA, mildly dil RA. Mod elev PASP; b. 07/2020 TEE: EF 50-55%, no rwma, sev conc LVH. Nl RV fxn. Mod dil RA. Mild-mod MR, mod TR.   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 07/2020 Cardiac CT: Cor Ca2+ = 338 (99th %'ile).   Complication of anesthesia 20 years ago   a. spinal with first c section went too high stopped breathing, low bp with 2nd c section, no further issues with anesthesia   Heart murmur    a. 07/2019 Echo: no significant valvular dzs; b. 07/2020 TEE: Mild-mod MR, mod TR.   Hypertrophic cardiomyopathy ([place])    a. 07/2019 Echo: Mod LVH; b. 01/2020 cMRI: EF 67%, HCM w/o obstruction. Max wall thickness 76mm. Sev LAE w/ L->R atrial level shunt. Large area of LGE @ mid-ventricular level in area of max wall thickness; c. 07/2020 Cardiac CT: Asymm hypertrophy up to 26mm in mid inferoseptum consistent w/ known HCM.   Morbid obesity  ([place])    a. 09/2014 s/p gastric bypass.   Persistent atrial fibrillation ([place])    a. Dx 07/2019-->CHA2DS2VASc = 2 (diast CHF/Fem)-->Eliquis; b. 09/2018 s/p DCCV (150J (biphasic) x 1); c. 12/2019 RFCA/PVI; d. 07/2020 repeat RFCA/PVI.   Rash    both arms from old bed bug bites, healing   Tobacco abuse    Typical atrial flutter ([place])    a. 12/2019 s/p RFCA.    Past Surgical History:  Procedure Laterality Date   ABDOMINAL HYSTERECTOMY     ANKLE SURGERY     left x 2   ATRIAL FIBRILLATION ABLATION N/A 12/24/2019   Procedure: ATRIAL FIBRILLATION ABLATION;  Surgeon: Constance Haw, MD;  Location: [place] [facility];  Service: Cardiovascular;  Laterality: N/A;   ATRIAL FIBRILLATION ABLATION N/A 08/11/2020   Procedure: ATRIAL FIBRILLATION ABLATION;  Surgeon: Constance Haw, MD;  Location: [place] [facility];  Service: Cardiovascular;  Laterality: N/A;   CARDIOVERSION N/A 09/24/2019   Procedure: CARDIOVERSION;  Surgeon: Minna Merritts, MD;  Location: [place] [facility];  Service: Cardiovascular;  Laterality: N/A;   CARDIOVERSION N/A 10/17/2019   Procedure: CARDIOVERSION;  Surgeon: Minna Merritts, MD;  Location: [facility];  Service: Cardiovascular;  Laterality: N/A;   CARDIOVERSION N/A 11/08/2021   Procedure: CARDIOVERSION;  Surgeon: Rockey Situ,  Kathlene November, MD;  Location: [facility];  Service: Cardiovascular;  Laterality: N/A;   CESAREAN SECTION     x 3   IR ANGIO EXTRACRAN SEL COM CAROTID INNOMINATE UNI L MOD SED  11/13/2021   IR CT HEAD LTD  11/13/2021   IR PERCUTANEOUS ART THROMBECTOMY/INFUSION INTRACRANIAL INC DIAG ANGIO  11/13/2021   LAPAROSCOPIC GASTRIC SLEEVE RESECTION WITH HIATAL HERNIA REPAIR  08/17/2015   Procedure: LAPAROSCOPIC GASTRIC SLEEVE RESECTION WITH HIATAL HERNIA REPAIR;  Surgeon: Johnathan Hausen, MD;  Location: [facility];  Service: General;;   RADIOLOGY WITH ANESTHESIA N/A 11/12/2021   Procedure: IR WITH ANESTHESIA;  Surgeon: Luanne Bras, MD;  Location: [place];  Service: Radiology;   Laterality: N/A;   TEE WITHOUT CARDIOVERSION N/A 08/11/2020   Procedure: TRANSESOPHAGEAL ECHOCARDIOGRAM (TEE);  Surgeon: Constance Haw, MD;  Location: [place] [facility];  Service: Cardiovascular;  Laterality: N/A;   TEE WITHOUT CARDIOVERSION N/A 11/08/2021   Procedure: TRANSESOPHAGEAL ECHOCARDIOGRAM (TEE);  Surgeon: Minna Merritts, MD;  Location: [facility];  Service: Cardiovascular;  Laterality: N/A;   TUBAL LIGATION     x 2   UPPER GI ENDOSCOPY  08/17/2015   Procedure: UPPER GI ENDOSCOPY;  Surgeon: Johnathan Hausen, MD;  Location: [facility];  Service: General;;    Vitals:   11/18/21 0815  BP: 134/84  Pulse: (!) 50  Resp: 17  SpO2: 96%  Weight: 169 lb (76.7 kg)  Height: 5\' 4"  (1.626 m)      Subjective Assessment - 11/18/21 1132     Subjective Patient reports she is here due to an acute stroke on 11/12/2021 that has affected her left side. States she is very determined to return to her previous level of function and states only using a quad cane for community outings. She denies any pain or falls.    Pertinent History Patient is a 51 year old who presented to hospital on 11/12/2021 with sudden onset lethargy, Left sided weakness, slurred speech, L neglect. CTA head and neck demonstrate occluded R ICA and MCA. Pt underwent thrombectomy on 2/25. PMH includes HFpEF, hypertrophic cardiomyopathy, Afib.    Limitations Walking    How long can you sit comfortably? no issues    How long can you stand comfortably? unsure    How long can you walk comfortably? Unsure- not past 15 probably    Diagnostic tests CTA head and neck demonstrate occluded R ICA and MCA.    Patient Stated Goals I want to get stronger, be able to pick up my grandchildren, and return to work.    Currently in Pain? No/denies             OBJECTIVE  Musculoskeletal Tremor: Absent Bulk: Normal Tone: Normal, no clonus   Posture No gross abnormalities noted in standing or seated posture   Gait Entered clinic  with quad cane however able to walk without a device with reciprocal short steps with no obvious loss of balance- mild intermittent unsteadiness.    Strength R/L 5/4 Hip flexion 5/4 Hip external rotation 5/4 Hip internal rotation 5/4 Hip extension  5/4 Hip abduction 5/4 Hip adduction 5/4 Knee extension 5/4 Knee flexion 5/4 Ankle Plantarflexion 5/4 Ankle Dorsiflexion   NEUROLOGICAL:     Sensation Grossly intact to light touch bilateral UEs/LEs as determined by testing dermatomes C2-T2/L2-S2 respectively. Proprioception and hot/cold testing deferred on this date     Coordination/Cerebellar Finger to Nose: WNL Heel to Shin: WNL Rapid alternating movements: WNL Finger Opposition: WNL   FUNCTIONAL  OUTCOME MEASURES   Results Comments          FGA 20/30 Fall risk, in need of intervention  TUG 16.97 seconds No device  5TSTS  15 seconds No UE support  6 Minute Walk Test 1074 feet No device  10 Meter Gait Speed Self-selected: 11.35/11.95s = 0.36m/s avg Below normative values for full community ambulation                                [facility] PT Assessment - 11/18/21 0818       Assessment   Medical Diagnosis Acute ischemic stroke    Referring Provider (PT) Dr. Frazier Richards    Onset Date/Surgical Date 11/12/21    Hand Dominance Right    Next MD Visit 11/30/2021    Prior Therapy acute PT/OT      Precautions   Precautions Fall      Restrictions   Weight Bearing Restrictions No      Balance Screen   Has the patient fallen in the past 6 months No    Has the patient had a decrease in activity level because of a fear of falling?  Yes    Is the patient reluctant to leave their home because of a fear of falling?  No      Home Ecologist residence    Living Arrangements Spouse/significant other    Available Help at Discharge Family;Friend(s)    Type of Home Other(Comment)   [place] to enter     Entrance Stairs-Number of Steps 3    Entrance Stairs-Rails Left    [place] One level    [place] - quad      Prior Function   Level of Independence Independent    Vocation Full time employment    Engineer, manufacturing job for [employer]   Overall Cognitive Status Within [place] for tasks assessed    Attention Focused    Focused Attention Appears intact    Memory Appears intact    Awareness Appears intact    Problem Solving Appears intact    Executive Function Reasoning      Observation/Other Assessments   Focus on Therapeutic Outcomes (FOTO)  66      Functional Gait  Assessment   Gait assessed  Yes    Gait Level Surface Walks 20 ft in less than 5.5 sec, no assistive devices, good speed, no evidence for imbalance, normal gait pattern, deviates no more than 6 in outside of the 12 in walkway width.    Change in Gait Speed Makes only minor adjustments to walking speed, or accomplishes a change in speed with significant gait deviations, deviates 10-15 in outside the 12 in walkway width, or changes speed but loses balance but is able to recover and continue walking.    Gait with Horizontal Head Turns Performs head turns smoothly with slight change in gait velocity (eg, minor disruption to smooth gait path), deviates 6-10 in outside 12 in walkway width, or uses an assistive device.    Gait with Vertical Head Turns Performs task with slight change in gait velocity (eg, minor disruption to smooth gait path), deviates 6 - 10 in outside 12 in walkway width or uses assistive device    Gait and Pivot Turn Pivot turns safely in greater than 3 sec  and stops with no loss of balance, or pivot turns safely within 3 sec and stops with mild imbalance, requires small steps to catch balance.    Step Over Obstacle Is able to step over one shoe box (4.5 in total height) but must slow down and adjust steps to clear box safely. May require verbal  cueing.    Gait with Narrow Base of Support Is able to ambulate for 10 steps heel to toe with no staggering.    Gait with Eyes Closed Walks 20 ft, uses assistive device, slower speed, mild gait deviations, deviates 6-10 in outside 12 in walkway width. Ambulates 20 ft in less than 9 sec but greater than 7 sec.    Ambulating Backwards Walks 20 ft, uses assistive device, slower speed, mild gait deviations, deviates 6-10 in outside 12 in walkway width.    Steps Alternating feet, must use rail.    Total Score 20    FGA comment: no device used                        Objective measurements completed on examination: See above findings.                PT Education - 11/18/21 1142     Education Details PT plan of care and purpose of outcome measures    Person(s) Educated Patient    Methods Explanation    Comprehension Verbalized understanding              PT Short Term Goals - 11/18/21 1157       PT SHORT TERM GOAL #1   Title Pt will be independent with initial HEP in order to improve strength and balance in order to decrease fall risk and improve function at home and work.    Baseline 11/18/2021= No HEP in place    Time 6    Period Weeks    Status New    Target Date 12/30/21               PT Long Term Goals - 11/18/21 1158       PT LONG TERM GOAL #1   Title Pt will be independent with final HEP in order to improve strength and balance in order to decrease fall risk and improve function at home and work.    Baseline 11/18/2021= No formal HEP in place    Time 12    Period Weeks    Status New    Target Date 02/10/22      PT LONG TERM GOAL #2   Title Patient will increase Functional Gait Assessment score to >25/30 as to reduce fall risk and improve dynamic gait safety with community ambulation.    Baseline 11/18/2021= 20/30    Time 12    Period Weeks    Status New    Target Date 02/10/22      PT LONG TERM GOAL #3   Title Pt will decrease 5TSTS by  at least 3 seconds in order to demonstrate clinically significant improvement in LE strength.    Baseline 11/18/2021=15 sec without use of BUE Support    Time 12    Period Weeks    Status New    Target Date 02/10/22      PT LONG TERM GOAL #4   Title Pt will decrease TUG to below 14 seconds/decrease in order to demonstrate decreased fall risk.    Baseline 11/18/2021= 16.97 without an UE support  Time 12    Period Weeks    Status New    Target Date 02/10/22      PT LONG TERM GOAL #5   Title Pt will increase 6MWT by at least 25m (174ft) in order to demonstrate clinically significant improvement in cardiopulmonary endurance and community ambulation    Baseline 11/18/2021= 1024 feet in 6 min without an AD    Time 12    Period Weeks    Status New    Target Date 02/10/22      Additional Long Term Goals   Additional Long Term Goals Yes      PT LONG TERM GOAL #6   Title Pt will improve FOTO to target score of 81 to display perceived improvements in ability to complete ADL's.    Baseline 11/18/2021= 66    Time 12    Period Weeks    Status New    Target Date 02/10/22                    Plan - 11/18/21 1144     Clinical Impression Statement Clinical Impression: Pt is a pleasant 51  year-old female referred with diagnosis of ischemic stroke. PT examination reveals deficits  including Left sided muscle weakness, impaired gait and balance as seen by FGA and TUG/10 MWT. She will benefit from skilled PT services to address these deficits, improve balance, decrease risk for future falls, and assist in improved function to return to previous level of function.    Personal Factors and Comorbidities Comorbidity 3+    Comorbidities CHF, A- FIB, CVA    Examination-Activity Limitations Caring for Others;Lift    Examination-Participation Restrictions Community Activity;Yard Work;Occupation    Stability/Clinical Decision Making Evolving/Moderate complexity    Clinical Decision Making Moderate     Rehab Potential Good    PT Frequency 2x / week    PT Duration 12 weeks    PT Treatment/Interventions ADLs/Self Care Home Management;Canalith Repostioning;Cryotherapy;Electrical Stimulation;Moist Heat;DME Instruction;Gait training;Stair training;Functional mobility training;Therapeutic activities;Therapeutic exercise;Balance training;Neuromuscular re-education;Patient/family education;Manual techniques;Passive range of motion;Dry needling;Vestibular    PT Next Visit Plan Instruct in LE strengthening and higher level dynamic balance exercises    PT Home Exercise Plan to be initiated next 1-2 sessions    Recommended Other Services Patient to have OT eval immediately after PT eval    Consulted and Agree with Plan of Care Patient             Patient will benefit from skilled therapeutic intervention in order to improve the following deficits and impairments:  Abnormal gait, Decreased activity tolerance, Decreased balance, Decreased coordination, Decreased endurance, Decreased mobility, Decreased strength, Difficulty walking  Visit Diagnosis: Abnormality of gait and mobility  Difficulty in walking, not elsewhere classified  Muscle weakness (generalized)  Unsteadiness on feet     Problem List Patient Active Problem List   Diagnosis Date Noted   Middle cerebral artery embolism, 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   Acute ischemic stroke ([place]) 11/12/2021   A-fib ([place]) 11/03/2021   Atypical atrial flutter ([place])    Nonrheumatic tricuspid valve regurgitation    Bronchitis with acute wheezing 07/31/2019   Persistent atrial fibrillation ([place]) 07/30/2019   Acute exacerbation of CHF (congestive heart failure) ([place]) 07/30/2019   Bronchitis 07/30/2019   Transaminitis 07/30/2019   Atrial fibrillation with rapid ventricular response ([place])    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

## 2021-11-18 NOTE — Therapy (Signed)
[place] [facility] MAIN [facility] SERVICES [address] [place], [place], [address] Phone: [phone]   Fax:  [phone]  Occupational Therapy Evaluation  Patient Details  Name: Hannah Zimmerman MRN: [id number] Date of Birth: 1971/07/18 Referring Provider (OT): Cortney Ernestina Columbia, NP   Encounter Date: 11/18/2021   OT End of Session - 11/18/21 1226     Visit Number 1    Number of Visits 3    Date for OT Re-Evaluation 12/02/21    OT Start Time 0915    OT Stop Time 1010    OT Time Calculation (min) 55 min    Activity Tolerance Patient tolerated treatment well    Behavior During Therapy [facility] for tasks assessed/performed             Past Medical History:  Diagnosis Date   (HFpEF) heart failure with preserved ejection fraction (HCC)    a. 07/2019 Echo: EF 60-65%, mod LVH. Sev dil LA, mildly dil RA. Mod elev PASP; b. 07/2020 TEE: EF 50-55%, no rwma, sev conc LVH. Nl RV fxn. Mod dil RA. Mild-mod MR, mod TR.   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 07/2020 Cardiac CT: Cor Ca2+ = 338 (99th %'ile).   Complication of anesthesia 20 years ago   a. spinal with first c section went too high stopped breathing, low bp with 2nd c section, no further issues with anesthesia   Heart murmur    a. 07/2019 Echo: no significant valvular dzs; b. 07/2020 TEE: Mild-mod MR, mod TR.   Hypertrophic cardiomyopathy (HCC)    a. 07/2019 Echo: Mod LVH; b. 01/2020 cMRI: EF 67%, HCM w/o obstruction. Max wall thickness 19mm. Sev LAE w/ L->R atrial level shunt. Large area of LGE @ mid-ventricular level in area of max wall thickness; c. 07/2020 Cardiac CT: Asymm hypertrophy up to 19mm in mid inferoseptum consistent w/ known HCM.   Morbid obesity (HCC)    a. 09/2014 s/p gastric bypass.   Persistent atrial fibrillation (HCC)    a. Dx 07/2019-->[id number] = 2 (diast CHF/Fem)-->Eliquis; b. 09/2018 s/p DCCV (150J (biphasic) x 1); c. 12/2019 RFCA/PVI; d. 07/2020  repeat RFCA/PVI.   Rash    both arms from old bed bug bites, healing   Tobacco abuse    Typical atrial flutter (HCC)    a. 12/2019 s/p RFCA.    Past Surgical History:  Procedure Laterality Date   ABDOMINAL HYSTERECTOMY     ANKLE SURGERY     left x 2   ATRIAL FIBRILLATION ABLATION N/A 12/24/2019   Procedure: ATRIAL FIBRILLATION ABLATION;  Surgeon: Regan Lemming, MD;  Location: [facility] INVASIVE CV LAB;  Service: Cardiovascular;  Laterality: N/A;   ATRIAL FIBRILLATION ABLATION N/A 08/11/2020   Procedure: ATRIAL FIBRILLATION ABLATION;  Surgeon: Regan Lemming, MD;  Location: [facility] INVASIVE CV LAB;  Service: Cardiovascular;  Laterality: N/A;   CARDIOVERSION N/A 09/24/2019   Procedure: CARDIOVERSION;  Surgeon: Antonieta Iba, MD;  Location: [facility] ORS;  Service: Cardiovascular;  Laterality: N/A;   CARDIOVERSION N/A 10/17/2019   Procedure: CARDIOVERSION;  Surgeon: Antonieta Iba, MD;  Location: [facility] ORS;  Service: Cardiovascular;  Laterality: N/A;   CARDIOVERSION N/A 11/08/2021   Procedure: CARDIOVERSION;  Surgeon: Antonieta Iba, MD;  Location: [facility] ORS;  Service: Cardiovascular;  Laterality: N/A;   CESAREAN SECTION     x 3   IR ANGIO EXTRACRAN SEL COM CAROTID INNOMINATE UNI L MOD SED  11/13/2021  IR CT HEAD LTD  11/13/2021   IR PERCUTANEOUS ART THROMBECTOMY/INFUSION INTRACRANIAL INC DIAG ANGIO  11/13/2021   LAPAROSCOPIC GASTRIC SLEEVE RESECTION WITH HIATAL HERNIA REPAIR  08/17/2015   Procedure: LAPAROSCOPIC GASTRIC SLEEVE RESECTION WITH HIATAL HERNIA REPAIR;  Surgeon: Luretha Murphy, MD;  Location: [facility] ORS;  Service: General;;   RADIOLOGY WITH ANESTHESIA N/A 11/12/2021   Procedure: IR WITH ANESTHESIA;  Surgeon: Julieanne Cotton, MD;  Location: [facility] OR;  Service: Radiology;  Laterality: N/A;   TEE WITHOUT CARDIOVERSION N/A 08/11/2020   Procedure: TRANSESOPHAGEAL ECHOCARDIOGRAM (TEE);  Surgeon: Regan Lemming, MD;  Location: [facility] INVASIVE CV LAB;  Service: Cardiovascular;  Laterality:  N/A;   TEE WITHOUT CARDIOVERSION N/A 11/08/2021   Procedure: TRANSESOPHAGEAL ECHOCARDIOGRAM (TEE);  Surgeon: Antonieta Iba, MD;  Location: [facility] ORS;  Service: Cardiovascular;  Laterality: N/A;   TUBAL LIGATION     x 2   UPPER GI ENDOSCOPY  08/17/2015   Procedure: UPPER GI ENDOSCOPY;  Surgeon: Luretha Murphy, MD;  Location: [facility] ORS;  Service: General;;    There were no vitals filed for this visit.   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 No/denies    Pain Score 0-No pain               [facility] OT Assessment - 11/18/21 0911       Assessment   Medical Diagnosis Acute ischemic stroke    Referring Provider (OT) Cortney Ernestina Columbia, NP    Onset Date/Surgical Date 11/12/21    Hand Dominance Right    Next MD Visit 11/30/2021 cardiology    Prior Therapy acute OT/PT      Precautions   Precautions Fall      Restrictions   Weight Bearing Restrictions No      Balance Screen   Has the patient fallen in the past 6 months No      Home  Environment   Family/patient expects to be discharged to: Private residence    Living Arrangements Spouse/significant other   pt lives with fiance   Available Help at Discharge Family    Type of Home Other (Comment)   camper   Home Access Stairs    Home Layout One level    Alternate Level Stairs - Number of Steps 3 steps L hand rail    Home Equipment Shower seat;Cane -quad      Prior Function   Level of Independence Independent    Vocation Full time employment   30 hrs/week   Vocation Requirements desk job for [employer]    Leisure Crafts      ADL   Eating/Feeding Modified independent    difficulty cutting food/extra time needed   Upper Body Dressing Independent   extra time needed to manage zipper on sweatshirt   ADL comments pt reports supv for first shower out of the hospital, but no hands on assist and pt used a shower chair; pt does not feel she needs the chair at this time      Mobility   Mobility Status Independent   using quad cane to  come in to therapy, but PT cleared pt this morning for using AD only as desired, and if desired, PT encouraged use of single point cane.     Vision - History   Baseline Vision Wears glasses only for reading      Vision Assessment   Tracking/Visual Pursuits Able to track stimulus in all quads without difficulty    Saccades Within functional limits    Visual Fields No apparent deficits      Cognition   Overall Cognitive Status Within Functional Limits for tasks assessed      Observation/Other Assessments   Focus on Therapeutic Outcomes (FOTO)  76      Sensation   Light Touch Appears Intact      Coordination   Gross Motor Movements are Fluid and Coordinated Yes    Fine Motor Movements are Fluid and Coordinated No    Coordination and Movement Description mild apraxia L hand; slight difficulty with quick/alternating movements    Right 9 Hole Peg Test 27 sec    Left 9 Hole Peg Test 30 sec      AROM   Overall AROM Comments BUEs WNL      Strength   Overall Strength Comments RUE 4+/5, LUE 4/5      Hand Function   Right Hand Grip (lbs) 52    Right Hand Lateral Pinch 15 lbs    Right Hand 3 Point Pinch 10 lbs    Left Hand Grip (lbs) 46    Left Hand Lateral Pinch 13 lbs    Left 3 point pinch 9 lbs           Occupational Therapy Evaluation Pt is a 51 y/o female s/p R ICA and R MCA stroke, s/p thrombectomy.  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  Pt resides with fiance in a camper.  Pt presents with mild LUE weakness at eval.  Pt  reports she is mostly struggling to open her medication bottles and it takes her slight extra time to cut her food.  Anticipate 1-2 more sessions with pt as pt demonstrated good ability to perform HEP this day and residual weakness is quite mild.  Pt in agreement with plan.   Therapeutic Exercise: Issued pink theraputty and instructed pt in strengthening and coordination exercises for L hand (though encouraged bilat as both are generally weak), including gross grasping, lateral/2 point/3 point pinching, digit abd/add, and digging coins out of putty.  Able to return demo with intermittent vc for technique to improve quality of movement.  Encouraged completion 5-10 min, 1-2x per day.  Issued red theraband and instructed pt in LUE strengthening for shoulder horiz abd, elevation, abd, and elbow flex/ext with mod vc for technique.  Handout issued for putty and theraband exercises.  Will review next session.      OT Education - 11/18/21 1226     Education Details OT role, goals, poc, HEP    Person(s) Educated Patient    Methods Explanation;Demonstration;Verbal cues;Handout    Comprehension Verbalized understanding;Returned demonstration;Verbal cues required;Need further instruction              OT Short Term Goals - 11/18/21 1230       OT SHORT TERM GOAL #1   Title Pt will be indep wtih HEP for improving strength/coordination in LUE.    Baseline Eval: issued putty and theraband at eval; further training needed  Time 2    Period Days    Status New    Target Date 11/22/21               OT Long Term Goals - 11/18/21 1231       OT LONG TERM GOAL #1   Title Pt will increase FOTO score to 80 or better to indicate increased functional performance.    Baseline Eval: 76    Time 1    Period Weeks    Status New    Target Date 11/25/21      OT LONG TERM GOAL #2   Title Pt will increase L grip to enable opening all of her medication bottles independently.    Baseline Eval: pt  reports difficulty opening tylenol bottle    Time 1    Period Weeks    Status New    Target Date 11/25/21                   Plan - 11/18/21 1227     Clinical Impression Statement Pt is a 51 y/o female s/p R ICA and R MCA stroke, s/p thrombectomy.  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  Pt resides with fiance in a camper.  Pt presents with mild LUE weakness at eval.  Pt reports she is mostly struggling to open her medication bottles and it takes her slight extra time to cut her food.  Anticipate 1-2 more sessions with pt as pt demonstrated good ability to perform HEP this day and residual weakness is quite mild.  Pt in agreement with plan.    OT Occupational Profile and History Problem Focused Assessment - Including review of records relating to presenting problem    Occupational performance deficits (Please refer to evaluation for details): IADL's;Leisure;Work;ADL's    Body Structure / Function / Physical Skills ADL;Coordination;UE functional use;IADL;Dexterity;FMC;Strength    Rehab Potential Excellent    Clinical Decision Making Limited treatment options, no task modification necessary    Comorbidities Affecting Occupational Performance: None    Modification or Assistance to Complete Evaluation  No modification of tasks or assist necessary to complete eval    OT Frequency 2x / week    OT Duration 2 weeks    OT Treatment/Interventions Self-care/ADL training;Therapeutic exercise;Therapeutic activities;Patient/family education;Neuromuscular education    Plan Anticipate need for 1-2 more sessions    OT Home Exercise Plan issued pink theraputty and red theraband and instructed in HEP for LUE strengthening/coordination; handout given    Consulted and Agree with Plan of Care Patient             Patient will benefit from skilled therapeutic intervention in order to improve the following  deficits and impairments:   Body Structure / Function / Physical Skills: ADL, Coordination, UE functional use, IADL, Dexterity, FMC, Strength       Visit Diagnosis: Muscle weakness (generalized)  Other lack of coordination    Problem List Patient Active Problem List   Diagnosis Date Noted   Middle cerebral artery embolism, 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   Acute ischemic stroke (HCC) 11/12/2021   A-fib (HCC) 11/03/2021   Atypical atrial flutter (HCC)    Nonrheumatic tricuspid valve regurgitation    Bronchitis with acute wheezing 07/31/2019   Persistent atrial fibrillation (HCC) 07/30/2019   Acute exacerbation of CHF (congestive heart failure) (HCC) 07/30/2019   Bronchitis 07/30/2019   Transaminitis 07/30/2019  Atrial fibrillation with rapid ventricular response (HCC)    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

## 2021-11-19 LAB — CULTURE, BLOOD (ROUTINE X 2)
Culture: NO GROWTH
Culture: NO GROWTH
Special Requests: ADEQUATE

## 2021-11-20 LAB — CULTURE, BLOOD (ROUTINE X 2)
Culture: NO GROWTH
Culture: NO GROWTH
Special Requests: ADEQUATE

## 2021-11-22 ENCOUNTER — Other Ambulatory Visit: Payer: Self-pay

## 2021-11-22 ENCOUNTER — Ambulatory Visit: Payer: Managed Care, Other (non HMO) | Admitting: Physical Therapy

## 2021-11-22 ENCOUNTER — Ambulatory Visit: Payer: Managed Care, Other (non HMO)

## 2021-11-22 DIAGNOSIS — M6281 Muscle weakness (generalized): Secondary | ICD-10-CM

## 2021-11-22 DIAGNOSIS — R269 Unspecified abnormalities of gait and mobility: Secondary | ICD-10-CM

## 2021-11-22 DIAGNOSIS — R278 Other lack of coordination: Secondary | ICD-10-CM

## 2021-11-22 DIAGNOSIS — R262 Difficulty in walking, not elsewhere classified: Secondary | ICD-10-CM

## 2021-11-22 DIAGNOSIS — R2681 Unsteadiness on feet: Secondary | ICD-10-CM

## 2021-11-22 NOTE — Therapy (Signed)
[place] MAIN [facility] SERVICES [address] [place], [place], [address] Phone: [phone]   Fax:  [phone]  Physical Therapy Treatment  Patient Details  Name: Hannah Zimmerman MRN: [id number] Date of Birth: 08/29/1971 Referring Provider (Hannah Zimmerman): Dr. Frazier Richards   Encounter Date: 11/22/2021   Hannah Zimmerman End of Session - 11/22/21 1525     Visit Number 2    Number of Visits 25    Date for Hannah Zimmerman Re-Evaluation 02/10/22    Authorization Type [employer] Managed - 30 combined visit (Hannah Zimmerman/OT/Chiro)    Authorization Time Period Initial Cert period - 01/16/257- 02/10/2022    Progress Note Due on Visit 10    Hannah Zimmerman Start Time 1515    Hannah Zimmerman Stop Time 1559    Hannah Zimmerman Time Calculation (min) 44 min    Equipment Utilized During Treatment Gait belt    Activity Tolerance Patient tolerated treatment well    Behavior During Therapy WFL for tasks assessed/performed             Past Medical History:  Diagnosis Date   (HFpEF) heart failure with preserved ejection fraction ([place])    a. 07/2019 Echo: EF 60-65%, mod LVH. Sev dil LA, mildly dil RA. Mod elev PASP; b. 07/2020 TEE: EF 50-55%, no rwma, sev conc LVH. Nl RV fxn. Mod dil RA. Mild-mod MR, mod TR.   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 07/2020 Cardiac CT: Cor Ca2+ = 338 (99th %'ile).   Complication of anesthesia 20 years ago   a. spinal with first c section went too high stopped breathing, low bp with 2nd c section, no further issues with anesthesia   Heart murmur    a. 07/2019 Echo: no significant valvular dzs; b. 07/2020 TEE: Mild-mod MR, mod TR.   Hypertrophic cardiomyopathy ([place])    a. 07/2019 Echo: Mod LVH; b. 01/2020 cMRI: EF 67%, HCM w/o obstruction. Max wall thickness 66m. Sev LAE w/ L->R atrial level shunt. Large area of LGE @ mid-ventricular level in area of max wall thickness; c. 07/2020 Cardiac CT: Asymm hypertrophy up to 178min mid inferoseptum consistent w/ known HCM.   Morbid obesity  ([facility]   a. 09/2014 s/p gastric bypass.   Persistent atrial fibrillation ([facility]   a. Dx 07/2019-->CHA2DS2VASc = 2 (diast CHF/Fem)-->Eliquis; b. 09/2018 s/p DCCV (150J (biphasic) x 1); c. 12/2019 RFCA/PVI; d. 07/2020 repeat RFCA/PVI.   Rash    both arms from old bed bug bites, healing   Tobacco abuse    Typical atrial flutter ([facility]   a. 12/2019 s/p RFCA.    Past Surgical History:  Procedure Laterality Date   ABDOMINAL HYSTERECTOMY     ANKLE SURGERY     left x 2   ATRIAL FIBRILLATION ABLATION N/A 12/24/2019   Procedure: ATRIAL FIBRILLATION ABLATION;  Surgeon: CaConstance HawMD;  [facility];  Service: Cardiovascular;  Laterality: N/A;   ATRIAL FIBRILLATION ABLATION N/A 08/11/2020   Procedure: ATRIAL FIBRILLATION ABLATION;  Surgeon: CaConstance HawMD;  [facility];  Service: Cardiovascular;  Laterality: N/A;   CARDIOVERSION N/A 09/24/2019   Procedure: CARDIOVERSION;  Surgeon: GoMinna MerrittsMD;  [facility];  Service: Cardiovascular;  Laterality: N/A;   CARDIOVERSION N/A 10/17/2019   Procedure: CARDIOVERSION;  Surgeon: GoMinna MerrittsMD;  [facility];  Service: Cardiovascular;  Laterality: N/A;   CARDIOVERSION N/A 11/08/2021   Procedure: CARDIOVERSION;  Surgeon: GoRockey Situ  Kathlene November, MD;  [facility];  Service: Cardiovascular;  Laterality: N/A;   CESAREAN SECTION     x 3   IR ANGIO EXTRACRAN SEL COM CAROTID INNOMINATE UNI L MOD SED  11/13/2021   IR CT HEAD LTD  11/13/2021   IR PERCUTANEOUS ART THROMBECTOMY/INFUSION INTRACRANIAL INC DIAG ANGIO  11/13/2021   LAPAROSCOPIC GASTRIC SLEEVE RESECTION WITH HIATAL HERNIA REPAIR  08/17/2015   Procedure: LAPAROSCOPIC GASTRIC SLEEVE RESECTION WITH HIATAL HERNIA REPAIR;  Surgeon: Johnathan Hausen, MD;  [facility];  Service: General;;   RADIOLOGY WITH ANESTHESIA N/A 11/12/2021   Procedure: IR WITH ANESTHESIA;  Surgeon: Luanne Bras, MD;  Location: [place];  Service: Radiology;   Laterality: N/A;   TEE WITHOUT CARDIOVERSION N/A 08/11/2020   Procedure: TRANSESOPHAGEAL ECHOCARDIOGRAM (TEE);  Surgeon: Constance Haw, MD;  Location: [place] [facility];  Service: Cardiovascular;  Laterality: N/A;   TEE WITHOUT CARDIOVERSION N/A 11/08/2021   Procedure: TRANSESOPHAGEAL ECHOCARDIOGRAM (TEE);  Surgeon: Minna Merritts, MD;  [facility]: [facility];  Service: Cardiovascular;  Laterality: N/A;   TUBAL LIGATION     x 2   UPPER GI ENDOSCOPY  08/17/2015   Procedure: UPPER GI ENDOSCOPY;  Surgeon: Johnathan Hausen, MD;  [facility];  Service: General;;    There were no vitals filed for this visit.   Subjective Assessment - 11/22/21 1516     Subjective Hannah Zimmerman reports no singificant changes since last session. Reports she feels her left foot is dragging alittle bit more at this point compred to previously. Hannah Zimmerman rpeorts she feels she has a headache at this time on the right side of her head laterally.    Pertinent History Patient is a 51 year old who presented to hospital on 11/12/2021 with sudden onset lethargy, Left sided weakness, slurred speech, L neglect. CTA head and neck demonstrate occluded R ICA and MCA. Hannah Zimmerman underwent thrombectomy on 2/25. PMH includes HFpEF, hypertrophic cardiomyopathy, Afib.    Limitations Walking    How long can you sit comfortably? no issues    How long can you stand comfortably? unsure    How long can you walk comfortably? Unsure- not past 15 probably    Diagnostic tests CTA head and neck demonstrate occluded R ICA and MCA.    Patient Stated Goals I want to get stronger, be able to pick up my grandchildren, and return to work.    Currently in Pain? Yes    Pain Score 3     Pain Location Head    Pain Orientation Right;Anterior    Pain Descriptors / Indicators Aching    Pain Type Chronic pain                 Exercise/Activity Sets/ Reps/Time/ Resistance Assistance Charge type Comments  Dual task balance with 1 LE on airex and 1 on 6 inch step  plus theraband pad  *5 games of tic tack toe   NMR  Most difficulty exercise noted.   Lateral sidestepping with red Thera-Band around knees 2 sets of 3 laps along parallel bars  Therapeutic exercise   Dynamic sit to stand 1 sets of 10 with staggered stance with left lower extremity posterior 1 set with Airex pad under right lower extremity   Staggered stance that it is part of home exercise program Patient reports increased difficulty with Airex in the right lower extremity  Tandem stance 3 x 45 sec  Neuro re-ed Home exercise program  Airex step up lateral 10 x ea  Neuro re-ed Fatigue noted during end of repetitions  SLS progression 1 lower extremity on floor other extremity on Airex with ball handoff X10 to each side  Neuro re-ed No loss of balance noted fatigue noted in left knee musculature                                Treatment provided this session   Hannah Zimmerman educated throughout session about proper posture and technique with exercises. Improved exercise technique, movement at target joints, use of target muscles after min to mod verbal, visual, tactile cues. Note: Portions of this document were prepared using Dragon voice recognition software and although reviewed may contain unintentional dictation errors in syntax, grammar, or spelling.                          Hannah Zimmerman Education - 11/22/21 1518     Education Details HEP    Person(s) Educated Patient    Methods Explanation    Comprehension Verbalized understanding              Hannah Zimmerman Short Term Goals - 11/18/21 1157       Hannah Zimmerman SHORT TERM GOAL #1   Title Hannah Zimmerman will be independent with initial HEP in order to improve strength and balance in order to decrease fall risk and improve function at home and work.    Baseline 11/18/2021= No HEP in place    Time 6    Period Weeks    Status New    Target Date 12/30/21               Hannah Zimmerman Long Term Goals - 11/18/21 1158       Hannah Zimmerman LONG TERM GOAL #1   Title Hannah Zimmerman will be  independent with final HEP in order to improve strength and balance in order to decrease fall risk and improve function at home and work.    Baseline 11/18/2021= No formal HEP in place    Time 12    Period Weeks    Status New    Target Date 02/10/22      Hannah Zimmerman LONG TERM GOAL #2   Title Patient will increase Functional Gait Assessment score to >25/30 as to reduce fall risk and improve dynamic gait safety with community ambulation.    Baseline 11/18/2021= 20/30    Time 12    Period Weeks    Status New    Target Date 02/10/22      Hannah Zimmerman LONG TERM GOAL #3   Title Hannah Zimmerman will decrease 5TSTS by at least 3 seconds in order to demonstrate clinically significant improvement in LE strength.    Baseline 11/18/2021=15 sec without use of BUE Support    Time 12    Period Weeks    Status New    Target Date 02/10/22      Hannah Zimmerman LONG TERM GOAL #4   Title Hannah Zimmerman will decrease TUG to below 14 seconds/decrease in order to demonstrate decreased fall risk.    Baseline 11/18/2021= 16.97 without an UE support    Time 12    Period Weeks    Status New    Target Date 02/10/22      Hannah Zimmerman LONG TERM GOAL #5   Title Hannah Zimmerman will increase 6MWT by at least 63m(163f in order to demonstrate clinically significant improvement in cardiopulmonary endurance and community ambulation    Baseline 11/18/2021= 1024 feet in  6 min without an AD    Time 12    Period Weeks    Status New    Target Date 02/10/22      Additional Long Term Goals   Additional Long Term Goals Yes      Hannah Zimmerman LONG TERM GOAL #6   Title Hannah Zimmerman will improve FOTO to target score of 81 to display perceived improvements in ability to complete ADL's.    Baseline 11/18/2021= 66    Time 12    Period Weeks    Status New    Target Date 02/10/22                   Plan - 11/22/21 1530     Clinical Impression Statement Patient presents with excellent motivation for completion of physical therapy program.  Patient was introduced to several dynamic lower extremity strengthening and  neuromuscular reeducation exercises in today's session was provided with initial home exercise program consisting of sit to stands with increased left lower extremity demand, tandem balance with left lower extremity posterior, and sidestepping with red Thera-Band around her knees.  Patient reported ice difficult exercises with dual task balance related exercises involving prolonged lower extremity endurance.  Patient will continue to benefit from skilled physical therapy intervention in order to improve her balance, decreased risk of falls, improve her overall mobility and function.    Personal Factors and Comorbidities Comorbidity 3+    Comorbidities CHF, A- FIB, CVA    Examination-Activity Limitations Caring for Others;Lift    Examination-Participation Restrictions Community Activity;Yard Work;Occupation    Stability/Clinical Decision Making Evolving/Moderate complexity    Rehab Potential Good    Hannah Zimmerman Frequency 2x / week    Hannah Zimmerman Duration 12 weeks    Hannah Zimmerman Treatment/Interventions ADLs/Self Care Home Management;Canalith Repostioning;Cryotherapy;Electrical Stimulation;Moist Heat;DME Instruction;Gait training;Stair training;Functional mobility training;Therapeutic activities;Therapeutic exercise;Balance training;Neuromuscular re-education;Patient/family education;Manual techniques;Passive range of motion;Dry needling;Vestibular    Hannah Zimmerman Next Visit Plan Instruct in LE strengthening and higher level dynamic balance exercises    Hannah Zimmerman Home Exercise Plan to be initiated next 1-2 sessions    Consulted and Agree with Plan of Care Patient             Patient will benefit from skilled therapeutic intervention in order to improve the following deficits and impairments:  Abnormal gait, Decreased activity tolerance, Decreased balance, Decreased coordination, Decreased endurance, Decreased mobility, Decreased strength, Difficulty walking  Visit Diagnosis: Abnormality of gait and mobility  Difficulty in walking, not  elsewhere classified  Unsteadiness on feet     Problem List Patient Active Problem List   Diagnosis Date Noted   Middle cerebral artery embolism, 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   Acute ischemic stroke ([place]) 11/12/2021   A-fib ([place]) 11/03/2021   Atypical atrial flutter ([facility])    Nonrheumatic tricuspid valve regurgitation    Bronchitis with acute wheezing 07/31/2019   Persistent atrial fibrillation ([place]) 07/30/2019   Acute exacerbation of CHF (congestive heart failure) ([place]) 07/30/2019   Bronchitis 07/30/2019   Transaminitis 07/30/2019   Atrial fibrillation with rapid ventricular response ([place])    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 [place], [place], [address] Phone: [phone]   Fax:  [phone]  Name: Hannah Zimmerman MRN: [id number]  Date of Birth: 11-07-1970

## 2021-11-23 ENCOUNTER — Other Ambulatory Visit (HOSPITAL_COMMUNITY): Payer: Self-pay

## 2021-11-23 NOTE — Therapy (Signed)
[place] MAIN [facility] SERVICES [address] [place], [place], [address] Phone: [phone]   Fax:  [phone]  Occupational Therapy Treatment  Patient Details  Name: LORAINE BHULLAR MRN: [id number] Date of Birth: Jan 31, 1971 Referring Provider (OT): Cortney Carron Curie, NP   Encounter Date: 11/22/2021   OT End of Session - 11/23/21 0831     Visit Number 2    Number of Visits 3    Date for OT Re-Evaluation 12/02/21    OT Start Time 1600    OT Stop Time 1640    OT Time Calculation (min) 40 min    Activity Tolerance Patient tolerated treatment well    Behavior During Therapy [facility] for tasks assessed/performed             Past Medical History:  Diagnosis Date   (HFpEF) heart failure with preserved ejection fraction ([place])    a. 07/2019 Echo: EF 60-65%, mod LVH. Sev dil LA, mildly dil RA. Mod elev PASP; b. 07/2020 TEE: EF 50-55%, no rwma, sev conc LVH. Nl RV fxn. Mod dil RA. Mild-mod MR, mod TR.   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 07/2020 Cardiac CT: Cor Ca2+ = 338 (99th %'ile).   Complication of anesthesia 20 years ago   a. spinal with first c section went too high stopped breathing, low bp with 2nd c section, no further issues with anesthesia   Heart murmur    a. 07/2019 Echo: no significant valvular dzs; b. 07/2020 TEE: Mild-mod MR, mod TR.   Hypertrophic cardiomyopathy ([place])    a. 07/2019 Echo: Mod LVH; b. 01/2020 cMRI: EF 67%, HCM w/o obstruction. Max wall thickness 51m. Sev LAE w/ L->R atrial level shunt. Large area of LGE @ mid-ventricular level in area of max wall thickness; c. 07/2020 Cardiac CT: Asymm hypertrophy up to 179min mid inferoseptum consistent w/ known HCM.   Morbid obesity ([facility]   a. 09/2014 s/p gastric bypass.   Persistent atrial fibrillation ([place]   a. Dx 07/2019-->CHA2DS2VASc = 2 (diast CHF/Fem)-->Eliquis; b. 09/2018 s/p DCCV (150J (biphasic) x 1); c. 12/2019 RFCA/PVI; d. 07/2020 repeat  RFCA/PVI.   Rash    both arms from old bed bug bites, healing   Tobacco abuse    Typical atrial flutter ([facility]   a. 12/2019 s/p RFCA.    Past Surgical History:  Procedure Laterality Date   ABDOMINAL HYSTERECTOMY     ANKLE SURGERY     left x 2   ATRIAL FIBRILLATION ABLATION N/A 12/24/2019   Procedure: ATRIAL FIBRILLATION ABLATION;  Surgeon: CaConstance HawMD;  [facility];  Service: Cardiovascular;  Laterality: N/A;   ATRIAL FIBRILLATION ABLATION N/A 08/11/2020   Procedure: ATRIAL FIBRILLATION ABLATION;  Surgeon: CaConstance HawMD;  [facility];  Service: Cardiovascular;  Laterality: N/A;   CARDIOVERSION N/A 09/24/2019   Procedure: CARDIOVERSION;  Surgeon: GoMinna MerrittsMD;  [facility];  Service: Cardiovascular;  Laterality: N/A;   CARDIOVERSION N/A 10/17/2019   Procedure: CARDIOVERSION;  Surgeon: GoMinna MerrittsMD;  [facility];  Service: Cardiovascular;  Laterality: N/A;   CARDIOVERSION N/A 11/08/2021   Procedure: CARDIOVERSION;  Surgeon: GoMinna MerrittsMD;  [facility];  Service: Cardiovascular;  Laterality: N/A;   CESAREAN SECTION     x 3   IR ANGIO EXTRACRAN SEL COM CAROTID INNOMINATE UNI L MOD SED  11/13/2021  IR CT HEAD LTD  11/13/2021   IR PERCUTANEOUS ART THROMBECTOMY/INFUSION INTRACRANIAL INC DIAG ANGIO  11/13/2021   LAPAROSCOPIC GASTRIC SLEEVE RESECTION WITH HIATAL HERNIA REPAIR  08/17/2015   Procedure: LAPAROSCOPIC GASTRIC SLEEVE RESECTION WITH HIATAL HERNIA REPAIR;  Surgeon: Johnathan Hausen, MD;  [facility];  Service: General;;   RADIOLOGY WITH ANESTHESIA N/A 11/12/2021   Procedure: IR WITH ANESTHESIA;  Surgeon: Luanne Bras, MD;  Location: [place];  Service: Radiology;  Laterality: N/A;   TEE WITHOUT CARDIOVERSION N/A 08/11/2020   Procedure: TRANSESOPHAGEAL ECHOCARDIOGRAM (TEE);  Surgeon: Constance Haw, MD;  Location: [place] [facility];  Service: Cardiovascular;  Laterality: N/A;    TEE WITHOUT CARDIOVERSION N/A 11/08/2021   Procedure: TRANSESOPHAGEAL ECHOCARDIOGRAM (TEE);  Surgeon: Minna Merritts, MD;  [facility];  Service: Cardiovascular;  Laterality: N/A;   TUBAL LIGATION     x 2   UPPER GI ENDOSCOPY  08/17/2015   Procedure: UPPER GI ENDOSCOPY;  Surgeon: Johnathan Hausen, MD;  [facility];  Service: General;;    There were no vitals filed for this visit.   Subjective Assessment - 11/22/21 1625     Subjective  "I only notice a little trouble with my L hand if I'm tired.  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 Yes    Pain Score 3     Pain Location Head    Pain Orientation Right;Anterior    Pain Descriptors / Indicators Aching    Pain Type Chronic pain    Pain Onset Today    Pain Frequency Constant    Aggravating Factors  N/A    Pain Relieving Factors tylenol and rest    Effect of Pain on Daily Activities decreased activity tolerance    Multiple Pain Sites No            Occupational Therapy Treatment: Therapeutic Exercise: Pt demonstrated pink theraputty exercises, including gross grasping, pinching, digit abd/add.  Pt required vc for technique.  Performed red theraband exercises for bilat shoulder horiz abd, elevation, abd, and elbow flex/ext; OT provided intermittent min vc for technique and theraband positioning modification d/t discomfort in L forearm from IV site in the hospital.  Pt was able to tolerate 3 sets 10 reps of theraband with overall min vc.  OT issued dicem to ease opening containers at home.    Response to Treatment: See Plan/clinical impression below.    OT Education - 11/22/21 0831     Education Details HEP    Person(s) Educated Patient    Methods Explanation;Demonstration;Verbal  cues;Handout    Comprehension Verbalized understanding;Returned demonstration;Verbal cues required;Need further instruction              OT Short Term Goals - 11/18/21 1230       OT SHORT TERM GOAL #1   Title Pt will be indep wtih HEP for improving strength/coordination in LUE.    Baseline Eval: issued putty and theraband at eval; further training needed    Time 2    Period Days    Status New    Target Date 11/22/21               OT Long Term Goals - 11/18/21 1231  OT LONG TERM GOAL #1   Title Pt will increase FOTO score to 80 or better to indicate increased functional performance.    Baseline Eval: 76    Time 1    Period Weeks    Status New    Target Date 11/25/21      OT LONG TERM GOAL #2   Title Pt will increase L grip to enable opening all of her medication bottles independently.    Baseline Eval: pt reports difficulty opening tylenol bottle    Time 1    Period Weeks    Status New    Target Date 11/25/21              Plan - 11/22/21 1425     Clinical Impression Statement Pt reports doing well and has already gotten back into crafting over the weekend.  Pt reports good carryover with therapeutic exercises including putty and theraband.  Pt performed all exercises today with intermittent min vc for technique.  Pt reported slight difficulty opening or tightening a jar.  OT issued dicem to ease this task.  Planning review of HEP next visit with plans to discharge at that time.  Pt in agreement with plan.    OT Occupational Profile and History Problem Focused Assessment - Including review of records relating to presenting problem    Occupational performance deficits (Please refer to evaluation for details): IADL's;Leisure;Work;ADL's    Body Structure / Function / Physical Skills ADL;Coordination;UE functional use;IADL;Dexterity;FMC;Strength    Rehab Potential Excellent    Clinical Decision Making Limited treatment options, no task modification necessary     Comorbidities Affecting Occupational Performance: None    Modification or Assistance to Complete Evaluation  No modification of tasks or assist necessary to complete eval    OT Frequency 2x / week    OT Duration 2 weeks    OT Treatment/Interventions Self-care/ADL training;Therapeutic exercise;Therapeutic activities;Patient/family education;Neuromuscular education    Plan Anticipate need for 1-2 more sessions    OT Home Exercise Plan issued pink theraputty and red theraband and instructed in HEP for LUE strengthening/coordination; handout given    Consulted and Agree with Plan of Care Patient             Patient will benefit from skilled therapeutic intervention in order to improve the following deficits and impairments:   Body Structure / Function / Physical Skills: ADL, Coordination, UE functional use, IADL, Dexterity, [place], Strength       Visit Diagnosis: Muscle weakness (generalized)  Other lack of coordination    Problem List Patient Active Problem List   Diagnosis Date Noted   Middle cerebral artery embolism, 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   Acute ischemic stroke ([place]) 11/12/2021   A-fib ([place]) 11/03/2021   Atypical atrial flutter ([place])    Nonrheumatic tricuspid valve regurgitation    Bronchitis with acute wheezing 07/31/2019   Persistent atrial fibrillation ([place]) 07/30/2019   Acute exacerbation of CHF (congestive heart failure) ([place]) 07/30/2019   Bronchitis 07/30/2019   Transaminitis 07/30/2019   Atrial fibrillation with rapid ventricular response ([place])    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

## 2021-11-24 ENCOUNTER — Ambulatory Visit: Payer: Managed Care, Other (non HMO) | Admitting: Physical Therapy

## 2021-11-24 ENCOUNTER — Ambulatory Visit: Payer: Managed Care, Other (non HMO)

## 2021-11-28 ENCOUNTER — Ambulatory Visit: Payer: Managed Care, Other (non HMO)

## 2021-11-28 ENCOUNTER — Other Ambulatory Visit: Payer: Self-pay

## 2021-11-28 ENCOUNTER — Encounter: Payer: Managed Care, Other (non HMO) | Admitting: Occupational Therapy

## 2021-11-28 DIAGNOSIS — R262 Difficulty in walking, not elsewhere classified: Secondary | ICD-10-CM

## 2021-11-28 DIAGNOSIS — R269 Unspecified abnormalities of gait and mobility: Secondary | ICD-10-CM

## 2021-11-28 DIAGNOSIS — M6281 Muscle weakness (generalized): Secondary | ICD-10-CM

## 2021-11-28 DIAGNOSIS — R2681 Unsteadiness on feet: Secondary | ICD-10-CM

## 2021-11-28 NOTE — Therapy (Incomplete)
[place] MAIN [facility] SERVICES [address] [place], [place], [address] Phone: [phone]   Fax:  [phone]  Physical Therapy Treatment  Patient Details  Name: Hannah Zimmerman MRN: [id number] Date of Birth: 1971/01/09 Referring Provider (PT): Dr. Frazier Richards   Encounter Date: 11/28/2021    Past Medical History:  Diagnosis Date   (HFpEF) heart failure with preserved ejection fraction ([place])    a. 07/2019 Echo: EF 60-65%, mod LVH. Sev dil LA, mildly dil RA. Mod elev PASP; b. 07/2020 TEE: EF 50-55%, no rwma, sev conc LVH. Nl RV fxn. Mod dil RA. Mild-mod MR, mod TR.   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 07/2020 Cardiac CT: Cor Ca2+ = 338 (99th %'ile).   Complication of anesthesia 20 years ago   a. spinal with first c section went too high stopped breathing, low bp with 2nd c section, no further issues with anesthesia   Heart murmur    a. 07/2019 Echo: no significant valvular dzs; b. 07/2020 TEE: Mild-mod MR, mod TR.   Hypertrophic cardiomyopathy ([place])    a. 07/2019 Echo: Mod LVH; b. 01/2020 cMRI: EF 67%, HCM w/o obstruction. Max wall thickness 81m. Sev LAE w/ L->R atrial level shunt. Large area of LGE @ mid-ventricular level in area of max wall thickness; c. 07/2020 Cardiac CT: Asymm hypertrophy up to 167min mid inferoseptum consistent w/ known HCM.   Morbid obesity ([place]   a. 09/2014 s/p gastric bypass.   Persistent atrial fibrillation ([place]   a. Dx 07/2019-->CHA2DS2VASc = 2 (diast CHF/Fem)-->Eliquis; b. 09/2018 s/p DCCV (150J (biphasic) x 1); c. 12/2019 RFCA/PVI; d. 07/2020 repeat RFCA/PVI.   Rash    both arms from old bed bug bites, healing   Tobacco abuse    Typical atrial flutter ([place]   a. 12/2019 s/p RFCA.    Past Surgical History:  Procedure Laterality Date   ABDOMINAL HYSTERECTOMY     ANKLE SURGERY     left x 2   ATRIAL FIBRILLATION ABLATION N/A 12/24/2019   Procedure: ATRIAL FIBRILLATION  ABLATION;  Surgeon: CaConstance HawMD;  [facility];  Service: Cardiovascular;  Laterality: N/A;   ATRIAL FIBRILLATION ABLATION N/A 08/11/2020   Procedure: ATRIAL FIBRILLATION ABLATION;  Surgeon: CaConstance HawMD;  [facility];  Service: Cardiovascular;  Laterality: N/A;   CARDIOVERSION N/A 09/24/2019   Procedure: CARDIOVERSION;  Surgeon: GoMinna MerrittsMD;  Location: [facility] ORS;  Service: Cardiovascular;  Laterality: N/A;   CARDIOVERSION N/A 10/17/2019   Procedure: CARDIOVERSION;  Surgeon: GoMinna MerrittsMD;  Location: [facility] ORS;  Service: Cardiovascular;  Laterality: N/A;   CARDIOVERSION N/A 11/08/2021   Procedure: CARDIOVERSION;  Surgeon: GoMinna MerrittsMD;  Location: [facility] ORS;  Service: Cardiovascular;  Laterality: N/A;   CESAREAN SECTION     x 3   IR ANGIO EXTRACRAN SEL COM CAROTID INNOMINATE UNI L MOD SED  11/13/2021   IR CT HEAD LTD  11/13/2021   IR PERCUTANEOUS ART THROMBECTOMY/INFUSION INTRACRANIAL INC DIAG ANGIO  11/13/2021   LAPAROSCOPIC GASTRIC SLEEVE RESECTION WITH HIATAL HERNIA REPAIR  08/17/2015   Procedure: LAPAROSCOPIC GASTRIC SLEEVE RESECTION WITH HIATAL HERNIA REPAIR;  Surgeon: MaJohnathan HausenMD;  [facility];  Service: General;;   RADIOLOGY WITH ANESTHESIA N/A 11/12/2021   Procedure: IR WITH ANESTHESIA;  Surgeon: DeLuanne BrasMD;  [facility] Service: Radiology;  Laterality: N/A;   TEE  WITHOUT CARDIOVERSION N/A 08/11/2020   Procedure: TRANSESOPHAGEAL ECHOCARDIOGRAM (TEE);  Surgeon: Constance Haw, MD;  Location: [place] CV LAB;  Service: Cardiovascular;  Laterality: N/A;   TEE WITHOUT CARDIOVERSION N/A 11/08/2021   Procedure: TRANSESOPHAGEAL ECHOCARDIOGRAM (TEE);  Surgeon: Minna Merritts, MD;  Location: [facility] ORS;  Service: Cardiovascular;  Laterality: N/A;   TUBAL LIGATION     x 2   UPPER GI ENDOSCOPY  08/17/2015   Procedure: UPPER GI ENDOSCOPY;  Surgeon: Johnathan Hausen, MD;  [facility];  Service: General;;    There were no vitals filed for this visit.           INTERVENTIONS:   Treatment limited secondary to patient late for appointment due to transportation issues.   Neuromuscular re-ed  Dynamic Marching   Forward/backward step over 1/2 foam  Side step over 1/2 foam   2 min step test= 40 on right   30 sec sit to stand= 12 reps without UE support  Education provided throughout session via VC/TC and demonstration to facilitate movement at target joints and correct muscle activation for all testing and exercises performed.                    PT Short Term Goals - 11/18/21 1157       PT SHORT TERM GOAL #1   Title Pt will be independent with initial HEP in order to improve strength and balance in order to decrease fall risk and improve function at home and work.    Baseline 11/18/2021= No HEP in place    Time 6    Period Weeks    Status New    Target Date 12/30/21               PT Long Term Goals - 11/18/21 1158       PT LONG TERM GOAL #1   Title Pt will be independent with final HEP in order to improve strength and balance in order to decrease fall risk and improve function at home and work.    Baseline 11/18/2021= No formal HEP in place    Time 12    Period Weeks    Status New    Target Date 02/10/22      PT LONG TERM GOAL #2   Title Patient will increase Functional Gait Assessment score to >25/30 as to reduce fall risk and improve dynamic gait safety with community ambulation.    Baseline 11/18/2021= 20/30    Time 12    Period Weeks    Status New    Target Date 02/10/22      PT LONG TERM GOAL #3   Title Pt will decrease 5TSTS by at least 3 seconds in order to demonstrate clinically significant improvement in LE strength.    Baseline 11/18/2021=15 sec without use of BUE Support    Time 12    Period Weeks    Status New    Target Date 02/10/22      PT LONG TERM GOAL #4   Title Pt will decrease TUG to below 14  seconds/decrease in order to demonstrate decreased fall risk.    Baseline 11/18/2021= 16.97 without an UE support    Time 12    Period Weeks    Status New    Target Date 02/10/22      PT LONG TERM GOAL #5   Title Pt will increase 6MWT by at least 42m(1645f in order to demonstrate clinically significant improvement  in cardiopulmonary endurance and community ambulation    Baseline 11/18/2021= 1024 feet in 6 min without an AD    Time 12    Period Weeks    Status New    Target Date 02/10/22      Additional Long Term Goals   Additional Long Term Goals Yes      PT LONG TERM GOAL #6   Title Pt will improve FOTO to target score of 81 to display perceived improvements in ability to complete ADL's.    Baseline 11/18/2021= 66    Time 12    Period Weeks    Status New    Target Date 02/10/22                    Patient will benefit from skilled therapeutic intervention in order to improve the following deficits and impairments:     Visit Diagnosis: No diagnosis found.     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

## 2021-11-30 ENCOUNTER — Ambulatory Visit (HOSPITAL_COMMUNITY)
Admit: 2021-11-30 | Discharge: 2021-11-30 | Disposition: A | Discharge status: Home / Self Care | Payer: Managed Care, Other (non HMO) | Attending: Physician Assistant | Admitting: Physician Assistant

## 2021-11-30 ENCOUNTER — Other Ambulatory Visit: Payer: Self-pay

## 2021-11-30 ENCOUNTER — Encounter (HOSPITAL_COMMUNITY): Payer: Self-pay | Admitting: Physician Assistant

## 2021-11-30 VITALS — BP 138/90 | HR 50 | Ht 64.0 in | Wt 165.8 lb

## 2021-11-30 DIAGNOSIS — Z9884 Bariatric surgery status: Secondary | ICD-10-CM | POA: Insufficient documentation

## 2021-11-30 DIAGNOSIS — R519 Headache, unspecified: Secondary | ICD-10-CM | POA: Insufficient documentation

## 2021-11-30 DIAGNOSIS — Z6828 Body mass index (BMI) 28.0-28.9, adult: Secondary | ICD-10-CM | POA: Insufficient documentation

## 2021-11-30 DIAGNOSIS — I422 Other hypertrophic cardiomyopathy: Secondary | ICD-10-CM | POA: Diagnosis not present

## 2021-11-30 DIAGNOSIS — I5030 Unspecified diastolic (congestive) heart failure: Secondary | ICD-10-CM | POA: Insufficient documentation

## 2021-11-30 DIAGNOSIS — I4819 Other persistent atrial fibrillation: Secondary | ICD-10-CM | POA: Diagnosis present

## 2021-11-30 DIAGNOSIS — D6869 Other thrombophilia: Secondary | ICD-10-CM | POA: Diagnosis not present

## 2021-11-30 NOTE — Progress Notes (Signed)
? ? ?Primary Care Physician: Kirk Ruths, MD ?Primary Cardiologist: Dr Rockey Situ ?Primary Electrophysiologist: Dr Curt Bears ?Referring Physician: Dr Curt Bears ? ? ?Hannah Zimmerman is a 51 y.o. female with a history of diastolic CHF, morbid obesity s/p gastric bypass, hypertrophic CM, and persistent atrial fibrillation who presents for follow up in the [facility]. Patient is on Eliquis for a CHADS2VASC score of 1. Patient continued to have episodes of afib with symptoms of generalized fatigue and dyspnea with exertion despite flecainide. She underwent afib ablation with Dr Curt Bears on 12/24/19 and repeat ablation 08/11/20. Cardiac MRI 01/28/20 confirmed HCM without obstruction.  ? ?She underwent TEE guided DCCV on 11/08/21. She presented to the hospital 11/12/21 for sudden onset slurring of speech. Patient underwent emergent PCI of the right ICA and MCA. EP was consulted for rhythm control and she was loaded on amiodarone. Dofetilide was cost prohibitive. 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 She reports near constant headaches since her hospitalization on 11/03/21 despite discontinuing BB and flecainide.  ? ?Today, she denies symptoms of palpitations, chest pain, shortness of breath, orthopnea, PND, lower extremity edema, dizziness, presyncope, syncope, snoring, daytime somnolence, bleeding, or neurologic sequela. The patient is tolerating medications without difficulties and is otherwise without complaint today.  ? ? ?Atrial Fibrillation Risk Factors: ? ?she does not have symptoms or diagnosis of sleep apnea. ?she does not have a history of rheumatic fever. ?The patient does have a history of early familial atrial fibrillation or other arrhythmias. ? ?she has a BMI of Body mass index is 28.46 kg/m?Marland KitchenMarland Kitchen ?Filed Weights  ? 11/30/21 1141  ?Weight: 75.2 kg  ? ? ? ?Family History  ?Problem Relation Age of Onset  ? Hypertension Mother   ? Diabetes Mother   ? Heart failure Mother    ? ? ? ?Atrial Fibrillation Management history: ? ?Previous antiarrhythmic drugs: flecainide, amiodarone  ?Previous cardioversions: 09/24/19, 10/17/19, 11/08/21 ?Previous ablations: 12/24/19, 08/11/20 ?CHADS2VASC score: 4 ?Anticoagulation history: Eliquis ? ? ?Past Medical History:  ?Diagnosis Date  ? (HFpEF) heart failure with preserved ejection fraction ([place])   ? a. 07/2019 Echo: EF 60-65%, mod LVH. Sev dil LA, mildly dil RA. Mod elev PASP; b. 07/2020 TEE: EF 50-55%, no rwma, sev conc LVH. Nl RV fxn. Mod dil RA. Mild-mod MR, mod TR.  ? Agatston coronary artery calcium score between 200 and 399   ? a. 07/2020 Cardiac CT: Cor Ca2+ = 338 (99th %'ile).  ? Complication of anesthesia 20 years ago  ? a. spinal with first c section went too high stopped breathing, low bp with 2nd c section, no further issues with anesthesia  ? Heart murmur   ? a. 07/2019 Echo: no significant valvular dzs; b. 07/2020 TEE: Mild-mod MR, mod TR.  ? Hypertrophic cardiomyopathy ([place])   ? a. 07/2019 Echo: Mod LVH; b. 01/2020 cMRI: EF 67%, HCM w/o obstruction. Max wall thickness 60m. Sev LAE w/ L->R atrial level shunt. Large area of LGE @ mid-ventricular level in area of max wall thickness; c. 07/2020 Cardiac CT: Asymm hypertrophy up to 139min mid inferoseptum consistent w/ known HCM.  ? Morbid obesity ([place]  ? a. 09/2014 s/p gastric bypass.  ? Persistent atrial fibrillation ([place]  ? a. Dx 07/2019-->CHA2DS2VASc = 2 (diast CHF/Fem)-->Eliquis; b. 09/2018 s/p DCCV (150J (biphasic) x 1); c. 12/2019 RFCA/PVI; d. 07/2020 repeat RFCA/PVI.  ? Rash   ? both arms from old bed bug bites, healing  ? Tobacco abuse   ?  Typical atrial flutter ([place])   ? a. 12/2019 s/p RFCA.  ? ?Past Surgical History:  ?Procedure Laterality Date  ? ABDOMINAL HYSTERECTOMY    ? ANKLE SURGERY    ? left x 2  ? ATRIAL FIBRILLATION ABLATION N/A 12/24/2019  ? Procedure: ATRIAL FIBRILLATION ABLATION;  Surgeon: Constance Haw, MD;  Location: [place] CV LAB;  Service: Cardiovascular;   Laterality: N/A;  ? ATRIAL FIBRILLATION ABLATION N/A 08/11/2020  ? Procedure: ATRIAL FIBRILLATION ABLATION;  Surgeon: Constance Haw, MD;  Location: [place] CV LAB;  Service: Cardiovascular;  Laterality: N/A;  ? CARDIOVERSION N/A 09/24/2019  ? Procedure: CARDIOVERSION;  Surgeon: Minna Merritts, MD;  Location: [facility] ORS;  Service: Cardiovascular;  Laterality: N/A;  ? CARDIOVERSION N/A 10/17/2019  ? Procedure: CARDIOVERSION;  Surgeon: Minna Merritts, MD;  Location: [facility] ORS;  Service: Cardiovascular;  Laterality: N/A;  ? CARDIOVERSION N/A 11/08/2021  ? Procedure: CARDIOVERSION;  Surgeon: Minna Merritts, MD;  Location: [facility] ORS;  Service: Cardiovascular;  Laterality: N/A;  ? CESAREAN SECTION    ? x 3  ? IR ANGIO EXTRACRAN SEL COM CAROTID INNOMINATE UNI L MOD SED  11/13/2021  ? IR CT HEAD LTD  11/13/2021  ? IR PERCUTANEOUS ART THROMBECTOMY/INFUSION INTRACRANIAL INC DIAG ANGIO  11/13/2021  ? LAPAROSCOPIC GASTRIC SLEEVE RESECTION WITH HIATAL HERNIA REPAIR  08/17/2015  ? Procedure: LAPAROSCOPIC GASTRIC SLEEVE RESECTION WITH HIATAL HERNIA REPAIR;  Surgeon: Johnathan Hausen, MD;  [facility];  Service: General;;  ? RADIOLOGY WITH ANESTHESIA N/A 11/12/2021  ? Procedure: IR WITH ANESTHESIA;  Surgeon: Luanne Bras, MD;  Location: [place];  Service: Radiology;  Laterality: N/A;  ? TEE WITHOUT CARDIOVERSION N/A 08/11/2020  ? Procedure: TRANSESOPHAGEAL ECHOCARDIOGRAM (TEE);  Surgeon: Constance Haw, MD;  Location: [place] CV LAB;  Service: Cardiovascular;  Laterality: N/A;  ? TEE WITHOUT CARDIOVERSION N/A 11/08/2021  ? Procedure: TRANSESOPHAGEAL ECHOCARDIOGRAM (TEE);  Surgeon: Minna Merritts, MD;  Location: [facility] ORS;  Service: Cardiovascular;  Laterality: N/A;  ? TUBAL LIGATION    ? x 2  ? UPPER GI ENDOSCOPY  08/17/2015  ? Procedure: UPPER GI ENDOSCOPY;  Surgeon: Johnathan Hausen, MD;  [facility];  Service: General;;  ? ? ?Current Outpatient Medications  ?Medication Sig Dispense Refill  ?  acetaminophen (TYLENOL) 500 MG tablet Take 1,000 mg by mouth every 6 (six) hours as needed (pain/headaches.).    ? amiodarone (PACERONE) 200 MG tablet Take 2 tablets (400 mg total) by mouth 2 (two) times daily for 1 week then 2 tablets (400 mg) by mouth daily for 2 weeks, then 1 tablet (200 mg) by mouth daily thereafter. 60 tablet 0  ? apixaban (ELIQUIS) 5 MG TABS tablet Take 1 tablet (5 mg total) by mouth 2 (two) times daily. 60 tablet 0  ? atorvastatin (LIPITOR) 40 MG tablet Take 1 tablet (40 mg total) by mouth at bedtime. 30 tablet 1  ? Cholecalciferol (VITAMIN D3 PO) Take 1 tablet by mouth in the morning.    ? Cyanocobalamin (VITAMIN B-12 PO) Take 1 tablet by mouth in the morning.    ? midodrine (PROAMATINE) 5 MG tablet Take 3 tablets (15 mg total) by mouth 3 (three) times daily. 270 tablet 1  ? nicotine (NICODERM CQ - DOSED IN MG/24 HOURS) 21 mg/24hr patch Place 1 patch (21 mg total) onto the skin daily. 28 patch 0  ? NON FORMULARY Take 1 each by mouth daily. Vitamin D + B12 gummy    ? ?No current  facility-administered medications for this encounter.  ? ? ?Allergies  ?Allergen Reactions  ? Codeine   ? Hydrocodone   ? Codeine Hives  ?   ?  ? Hydrocodone Hives  ? ? ?Social History  ? ?Socioeconomic History  ? Marital status: Single  ?  Spouse name: Not on file  ? Number of children: Not on file  ? Years of education: Not on file  ? Highest education level: Not on file  ?Occupational History  ? Not on file  ?Tobacco Use  ? Smoking status: Every Day  ?  Packs/day: 0.50  ?  Years: 31.00  ?  Pack years: 15.50  ?  Types: Cigarettes  ?  Passive exposure: Current  ? Smokeless tobacco: Never  ? Tobacco comments:  ?  10 cigarettes daily 11/30/21  ?Vaping Use  ? Vaping Use: Never used  ?Substance and Sexual Activity  ? Alcohol use: Yes  ?  Alcohol/week: 1.0 - 2.0 standard drink  ?  Types: 1 - 2 Standard drinks or equivalent per week  ?  Comment: 1-2 mixed drinks prior to stroke since d/c nothing 11/30/21  ? Drug use: No  ?  Sexual activity: Yes  ?Other Topics Concern  ? Not on file  ?Social History Narrative  ? ** Merged History Encounter **  ?    ? ?Social Determinants of Health  ? ?Financial Resource Strain: Not on file  ?Food Ins

## 2021-12-02 ENCOUNTER — Encounter: Payer: Managed Care, Other (non HMO) | Admitting: Occupational Therapy

## 2021-12-02 ENCOUNTER — Encounter: Payer: Self-pay | Admitting: Neurology

## 2021-12-02 ENCOUNTER — Ambulatory Visit: Payer: Managed Care, Other (non HMO) | Admitting: Physical Therapy

## 2021-12-05 ENCOUNTER — Ambulatory Visit: Payer: Managed Care, Other (non HMO)

## 2021-12-05 ENCOUNTER — Other Ambulatory Visit: Payer: Self-pay

## 2021-12-05 DIAGNOSIS — M6281 Muscle weakness (generalized): Secondary | ICD-10-CM | POA: Diagnosis not present

## 2021-12-05 DIAGNOSIS — R2681 Unsteadiness on feet: Secondary | ICD-10-CM

## 2021-12-05 DIAGNOSIS — R278 Other lack of coordination: Secondary | ICD-10-CM

## 2021-12-05 DIAGNOSIS — R269 Unspecified abnormalities of gait and mobility: Secondary | ICD-10-CM

## 2021-12-05 DIAGNOSIS — R262 Difficulty in walking, not elsewhere classified: Secondary | ICD-10-CM

## 2021-12-05 NOTE — Therapy (Signed)
[place] [facility] [facility] SERVICES ?[place], [place], [address] ?Phone: [phone]   Fax:  [phone] ? ?Physical Therapy Treatment/Discharge Summary ? ?Patient Details  ?Name: Hannah Zimmerman ?MRN: [id number] ?Date of Birth: 29-Oct-1970 ?Referring Provider (PT): Dr. Frazier Richards ? ? ?Encounter Date: 12/05/2021 ? ? PT End of Session - 12/05/21 1006   ? ? Visit Number 4   ? Number of Visits 25   ? Date for PT Re-Evaluation 02/10/22   ? Authorization Type [employer] Managed - 30 combined visit (PT/OT/[place])   ? Authorization Time Period Initial Cert period - 11/17/9516- 02/10/2022   ? Progress Note Due on Visit 10   ? PT Start Time 1005   ? PT Stop Time 1035   ? PT Time Calculation (min) 30 min   ? Equipment Utilized During Treatment Gait belt   ? Activity Tolerance Patient tolerated treatment well   ? Behavior During Therapy [facility] for tasks assessed/performed   ? ?  ?  ? ?  ? ? ?Past Medical History:  ?Diagnosis Date  ? (HFpEF) heart failure with preserved ejection fraction ([place])   ? a. 07/2019 Echo: EF 60-65%, mod LVH. Sev dil LA, mildly dil RA. Mod elev PASP; b. 07/2020 TEE: EF 50-55%, no rwma, sev conc LVH. Nl RV fxn. Mod dil RA. Mild-mod MR, mod TR.  ? Agatston coronary artery calcium score between 200 and 399   ? a. 07/2020 Cardiac CT: Cor Ca2+ = 338 (99th %'ile).  ? Complication of anesthesia 20 years ago  ? a. spinal with first c section went too high stopped breathing, low bp with 2nd c section, no further issues with anesthesia  ? Heart murmur   ? a. 07/2019 Echo: no significant valvular dzs; b. 07/2020 TEE: Mild-mod MR, mod TR.  ? Hypertrophic cardiomyopathy ([place])   ? a. 07/2019 Echo: Mod LVH; b. 01/2020 cMRI: EF 67%, HCM w/o obstruction. Max wall thickness 54m. Sev LAE w/ L->R atrial level shunt. Large area of LGE @ mid-ventricular level in area of max wall thickness; c. 07/2020 Cardiac CT: Asymm hypertrophy up to 136min mid inferoseptum consistent w/ known HCM.   ? Morbid obesity ([place]  ? a. 09/2014 s/p gastric bypass.  ? Persistent atrial fibrillation ([place]  ? a. Dx 07/2019-->CHA2DS2VASc = 2 (diast CHF/Fem)-->Eliquis; b. 09/2018 s/p DCCV (150J (biphasic) x 1); c. 12/2019 RFCA/PVI; d. 07/2020 repeat RFCA/PVI.  ? Rash   ? both arms from old bed bug bites, healing  ? Tobacco abuse   ? Typical atrial flutter (HCUtop[place]  ? a. 12/2019 s/p RFCA.  ? ? ?Past Surgical History:  ?Procedure Laterality Date  ? ABDOMINAL HYSTERECTOMY    ? ANKLE SURGERY    ? left x 2  ? ATRIAL FIBRILLATION ABLATION N/A 12/24/2019  ? Procedure: ATRIAL FIBRILLATION ABLATION;  Surgeon: CaConstance HawMD;  [facility];  Service: Cardiovascular;  Laterality: N/A;  ? ATRIAL FIBRILLATION ABLATION N/A 08/11/2020  ? Procedure: ATRIAL FIBRILLATION ABLATION;  Surgeon: CaConstance HawMD;  [facility];  Service: Cardiovascular;  Laterality: N/A;  ? CARDIOVERSION N/A 09/24/2019  ? Procedure: CARDIOVERSION;  Surgeon: GoMinna MerrittsMD;  [facility];  Service: Cardiovascular;  Laterality: N/A;  ? CARDIOVERSION N/A 10/17/2019  ? Procedure: CARDIOVERSION;  Surgeon: GoMinna MerrittsMD;  Location: [facility];  Service: Cardiovascular;  Laterality: N/A;  ? CARDIOVERSION N/A 11/08/2021  ? Procedure: CARDIOVERSION;  Surgeon:  Minna Merritts, MD;  Location: [facility];  Service: Cardiovascular;  Laterality: N/A;  ? CESAREAN SECTION    ? x 3  ? IR ANGIO EXTRACRAN SEL COM CAROTID INNOMINATE UNI L MOD SED  11/13/2021  ? IR CT HEAD LTD  11/13/2021  ? IR PERCUTANEOUS ART THROMBECTOMY/INFUSION INTRACRANIAL INC DIAG ANGIO  11/13/2021  ? LAPAROSCOPIC GASTRIC SLEEVE RESECTION WITH HIATAL HERNIA REPAIR  08/17/2015  ? Procedure: LAPAROSCOPIC GASTRIC SLEEVE RESECTION WITH HIATAL HERNIA REPAIR;  Surgeon: Johnathan Hausen, MD;  [facility];  Service: General;;  ? RADIOLOGY WITH ANESTHESIA N/A 11/12/2021  ? Procedure: IR WITH ANESTHESIA;  Surgeon: Luanne Bras, MD;  Location: [place];   Service: Radiology;  Laterality: N/A;  ? TEE WITHOUT CARDIOVERSION N/A 08/11/2020  ? Procedure: TRANSESOPHAGEAL ECHOCARDIOGRAM (TEE);  Surgeon: Constance Haw, MD;  Location: [place] CV LAB;  Service: Cardiovascular;  Laterality: N/A;  ? TEE WITHOUT CARDIOVERSION N/A 11/08/2021  ? Procedure: TRANSESOPHAGEAL ECHOCARDIOGRAM (TEE);  Surgeon: Minna Merritts, MD;  Location: [facility];  Service: Cardiovascular;  Laterality: N/A;  ? TUBAL LIGATION    ? x 2  ? UPPER GI ENDOSCOPY  08/17/2015  ? Procedure: UPPER GI ENDOSCOPY;  Surgeon: Johnathan Hausen, MD;  [facility];  Service: General;;  ? ? ?There were no vitals filed for this visit. ? ? Subjective Assessment - 12/05/21 1005   ? ? Subjective Patient reports she did well overall last week- Working 3 hours without diffiuclty, prepping for car show, and no falls. Still having some headaches.   ? Pertinent History Patient is a 51 year old who presented to hospital on 11/12/2021 with sudden onset lethargy, Left sided weakness, slurred speech, L neglect. CTA head and neck demonstrate occluded R ICA and MCA. Pt underwent thrombectomy on 2/25. PMH includes HFpEF, hypertrophic cardiomyopathy, Afib.   ? Limitations Walking   ? How long can you sit comfortably? no issues   ? How long can you stand comfortably? unsure   ? How long can you walk comfortably? Unsure- not past 15 probably   ? Diagnostic tests CTA head and neck demonstrate occluded R ICA and MCA.   ? Patient Stated Goals I want to get stronger, be able to pick up my grandchildren, and return to work.   ? Currently in Pain? Yes   ? Pain Score 3    ? Pain Location Head   ? Pain Descriptors / Indicators Aching   ? Pain Type Chronic pain   ? Aggravating Factors  no aggravating factors   ? Pain Relieving Factors Tylenol and rest   ? Effect of Pain on Daily Activities Still able to perform all activities.   ? ?  ?  ? ?  ? ? ?INTERVENTIONS:  ? ? [facility] PT Assessment - 12/05/21 0001   ? ?  ? Functional Gait   Assessment  ? Gait assessed  Yes   ? Gait Level Surface Walks 20 ft in less than 5.5 sec, no assistive devices, good speed, no evidence for imbalance, normal gait pattern, deviates no more than 6 in outside of the 12 in walkway width.   ? Change in Gait Speed Able to smoothly change walking speed without loss of balance or gait deviation. Deviate no more than 6 in outside of the 12 in walkway width.   ? Gait with Horizontal Head Turns Performs head turns smoothly with no change in gait. Deviates no more than 6 in outside 12 in walkway width   ?  Gait with Vertical Head Turns Performs head turns with no change in gait. Deviates no more than 6 in outside 12 in walkway width.   ? Gait and Pivot Turn Pivot turns safely within 3 sec and stops quickly with no loss of balance.   ? Step Over Obstacle Is able to step over 2 stacked shoe boxes taped together (9 in total height) without changing gait speed. No evidence of imbalance.   ? Gait with Narrow Base of Support Is able to ambulate for 10 steps heel to toe with no staggering.   ? Gait with Eyes Closed Walks 20 ft, no assistive devices, good speed, no evidence of imbalance, normal gait pattern, deviates no more than 6 in outside 12 in walkway width. Ambulates 20 ft in less than 7 sec.   ? Ambulating Backwards Walks 20 ft, no assistive devices, good speed, no evidence for imbalance, normal gait   ? Steps Alternating feet, no rail.   ? Total Score 30   ? ?  ?  ? ?  ? ?Reassessed Goals-  ? ?STG- Patient reports independent with HEP stating no questions or concerns with LE strengthening or balance as well as walking 1.25 to 1.5 mi.  ? ? ?LTG:  ? ?FOTO= 98 (improved from 66)  ? ?HEP- Independent- Goal met ? ?TUG= 8.90 sec without AD - GOAL MET ? ?5x STS= 10.42 sec (GOAL met)  ? ? ? ?FGA= 30/30 - GOAL MET ? ?6 min walk test= 1260 feet - GOAL MET ? ?Patient arrived today with excellent motivation. She was instructed to perform her HEP for 1 week then return today to reassess.  She reported and verbalized independence with her HEP, stated doing much better, and able to recall program without difficulty. She then performed all functional outcome measures- improving with each test and meeting

## 2021-12-06 ENCOUNTER — Other Ambulatory Visit: Payer: Self-pay

## 2021-12-06 ENCOUNTER — Other Ambulatory Visit (HOSPITAL_COMMUNITY): Payer: Self-pay | Admitting: *Deleted

## 2021-12-06 ENCOUNTER — Other Ambulatory Visit (HOSPITAL_COMMUNITY): Payer: Self-pay

## 2021-12-06 MED ORDER — AMIODARONE HCL 200 MG PO TABS: 200.0000 mg | ORAL_TABLET | Freq: Every day | ORAL | 3 refills | Status: DC

## 2021-12-07 ENCOUNTER — Ambulatory Visit: Payer: Managed Care, Other (non HMO)

## 2021-12-15 ENCOUNTER — Other Ambulatory Visit (HOSPITAL_COMMUNITY): Payer: Self-pay | Admitting: *Deleted

## 2021-12-15 MED ORDER — AMIODARONE HCL 200 MG PO TABS: 200.0000 mg | ORAL_TABLET | Freq: Every day | ORAL | 1 refills | Status: DC

## 2021-12-19 ENCOUNTER — Encounter: Payer: Self-pay | Admitting: Cardiology

## 2021-12-19 ENCOUNTER — Ambulatory Visit (INDEPENDENT_AMBULATORY_CARE_PROVIDER_SITE_OTHER): Payer: Managed Care, Other (non HMO) | Admitting: Cardiology

## 2021-12-19 VITALS — BP 100/62 | HR 48 | Ht 66.0 in | Wt 162.0 lb

## 2021-12-19 DIAGNOSIS — I4819 Other persistent atrial fibrillation: Secondary | ICD-10-CM

## 2021-12-19 DIAGNOSIS — D6869 Other thrombophilia: Secondary | ICD-10-CM

## 2021-12-19 MED ORDER — ATORVASTATIN CALCIUM 40 MG PO TABS: 40.0000 mg | ORAL_TABLET | Freq: Every day | ORAL | 2 refills | Status: DC

## 2021-12-19 MED ORDER — MIDODRINE HCL 5 MG PO TABS: 15.0000 mg | ORAL_TABLET | Freq: Three times a day (TID) | ORAL | 2 refills | Status: DC

## 2021-12-19 NOTE — Patient Instructions (Signed)
Medication Instructions:  Your physician recommends that you continue on your current medications as directed. Please refer to the Current Medication list given to you today.  *If you need a refill on your cardiac medications before your next appointment, please call your pharmacy*   Lab Work: None ordered   Testing/Procedures: None ordered   Follow-Up: At [facility], you and your health needs are our priority.  As part of our continuing mission to provide you with exceptional heart care, we have created designated Provider Care Teams.  These Care Teams include your primary Cardiologist (physician) and Advanced Practice Providers (APPs -  Physician Assistants and Nurse Practitioners) who all work together to provide you with the care you need, when you need it.  Your next appointment:   6 month(s)  The format for your next appointment:   In Person  Provider:   You will see one of the following Advanced Practice Providers on your designated Care Team:   Renee Ursuy, PA-C Michael "Andy" Tillery, PA-C   Thank you for choosing [facility]!!   Jadah Bobak, RN [phone]         

## 2021-12-19 NOTE — Progress Notes (Signed)
? ?Electrophysiology Office Note ? ? ?Date:  12/19/2021  ? ?ID:  Hannah Zimmerman, DOB 1970-10-27, MRN [id number] ? ?PCP:  Kirk Ruths, MD  ?Cardiologist:  Rockey Situ ?Primary Electrophysiologist:  Charistopher Rumble Meredith Leeds, MD   ? ?Chief Complaint: AF ?  ?History of Present Illness: ?Hannah Zimmerman is a 51 y.o. female who is being seen today for the evaluation of AF at the request of Kirk Ruths, MD. Presenting today for electrophysiology evaluation. ? ?She has a history significant for heart failure with preserved ejection fraction, morbid obesity status post gastric bypass, persistent atrial fibrillation, tobacco abuse.  She is status post ablation 12/24/2019 with repeat ablation 08/11/2020.  She had a cardiac MRI which confirmed hypertrophic cardiomyopathy with greater than 15% LGE.  She presented to [facility] with atrial fibrillation.  She started to go through menopause and was having hormone swings which she feels started her atrial fibrillation.  She is now post TEE and cardioversion.  Fortunately after her TEE and cardioversion, she read presented to the hospital with a cryptogenic stroke.  This required emergent PCI of the right ICA and MCA.  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 ? ?Today, denies symptoms of palpitations, chest pain, shortness of breath, orthopnea, PND, lower extremity edema, claudication, dizziness, presyncope, syncope, bleeding, or neurologic sequela. The patient is tolerating medications without difficulties.  She is doing well.  She has since recovered from her cryptogenic stroke.  She is happy with how she has been feeling. ? ? ?Past Medical History:  ?Diagnosis Date  ? (HFpEF) heart failure with preserved ejection fraction ([place])   ? a. 07/2019 Echo: EF 60-65%, mod LVH. Sev dil LA, mildly dil RA. Mod elev PASP; b. 07/2020 TEE: EF 50-55%, no rwma, sev conc LVH. Nl RV fxn. Mod dil RA. Mild-mod MR, mod TR.  ?  Agatston coronary artery calcium score between 200 and 399   ? a. 07/2020 Cardiac CT: Cor Ca2+ = 338 (99th %'ile).  ? Complication of anesthesia 20 years ago  ? a. spinal with first c section went too high stopped breathing, low bp with 2nd c section, no further issues with anesthesia  ? Heart murmur   ? a. 07/2019 Echo: no significant valvular dzs; b. 07/2020 TEE: Mild-mod MR, mod TR.  ? Hypertrophic cardiomyopathy ([place])   ? a. 07/2019 Echo: Mod LVH; b. 01/2020 cMRI: EF 67%, HCM w/o obstruction. Max wall thickness 32m. Sev LAE w/ L->R atrial level shunt. Large area of LGE @ mid-ventricular level in area of max wall thickness; c. 07/2020 Cardiac CT: Asymm hypertrophy up to 135min mid inferoseptum consistent w/ known HCM.  ? Morbid obesity ([facility]  ? a. 09/2014 s/p gastric bypass.  ? Persistent atrial fibrillation ([facility]  ? a. Dx 07/2019-->CHA2DS2VASc = 2 (diast CHF/Fem)-->Eliquis; b. 09/2018 s/p DCCV (150J (biphasic) x 1); c. 12/2019 RFCA/PVI; d. 07/2020 repeat RFCA/PVI.  ? Rash   ? both arms from old bed bug bites, healing  ? Tobacco abuse   ? Typical atrial flutter ([place]  ? a. 12/2019 s/p RFCA.  ? ?Past Surgical History:  ?Procedure Laterality Date  ? ABDOMINAL HYSTERECTOMY    ? ANKLE SURGERY    ? left x 2  ? ATRIAL FIBRILLATION ABLATION N/A 12/24/2019  ? Procedure: ATRIAL FIBRILLATION ABLATION;  Surgeon: CaConstance HawMD;  [facility];  Service: Cardiovascular;  Laterality: N/A;  ? ATRIAL FIBRILLATION ABLATION N/A  08/11/2020  ? Procedure: ATRIAL FIBRILLATION ABLATION;  Surgeon: Constance Haw, MD;  Location: [place] [facility];  Service: Cardiovascular;  Laterality: N/A;  ? CARDIOVERSION N/A 09/24/2019  ? Procedure: CARDIOVERSION;  Surgeon: Minna Merritts, MD;  Location: [facility];  Service: Cardiovascular;  Laterality: N/A;  ? CARDIOVERSION N/A 10/17/2019  ? Procedure: CARDIOVERSION;  Surgeon: Minna Merritts, MD;  Location: [facility];  Service: Cardiovascular;  Laterality: N/A;  ?  CARDIOVERSION N/A 11/08/2021  ? Procedure: CARDIOVERSION;  Surgeon: Minna Merritts, MD;  Location: [facility];  Service: Cardiovascular;  Laterality: N/A;  ? CESAREAN SECTION    ? x 3  ? IR ANGIO EXTRACRAN SEL COM CAROTID INNOMINATE UNI L MOD SED  11/13/2021  ? IR CT HEAD LTD  11/13/2021  ? IR PERCUTANEOUS ART THROMBECTOMY/INFUSION INTRACRANIAL INC DIAG ANGIO  11/13/2021  ? LAPAROSCOPIC GASTRIC SLEEVE RESECTION WITH HIATAL HERNIA REPAIR  08/17/2015  ? Procedure: LAPAROSCOPIC GASTRIC SLEEVE RESECTION WITH HIATAL HERNIA REPAIR;  Surgeon: Johnathan Hausen, MD;  Location: [facility];  Service: General;;  ? RADIOLOGY WITH ANESTHESIA N/A 11/12/2021  ? Procedure: IR WITH ANESTHESIA;  Surgeon: Luanne Bras, MD;  Location: [place];  Service: Radiology;  Laterality: N/A;  ? TEE WITHOUT CARDIOVERSION N/A 08/11/2020  ? Procedure: TRANSESOPHAGEAL ECHOCARDIOGRAM (TEE);  Surgeon: Constance Haw, MD;  Location: [place] [facility];  Service: Cardiovascular;  Laterality: N/A;  ? TEE WITHOUT CARDIOVERSION N/A 11/08/2021  ? Procedure: TRANSESOPHAGEAL ECHOCARDIOGRAM (TEE);  Surgeon: Minna Merritts, MD;  Location: [facility];  Service: Cardiovascular;  Laterality: N/A;  ? TUBAL LIGATION    ? x 2  ? UPPER GI ENDOSCOPY  08/17/2015  ? Procedure: UPPER GI ENDOSCOPY;  Surgeon: Johnathan Hausen, MD;  Location: [facility];  Service: General;;  ? ? ? ?Current Outpatient Medications  ?Medication Sig Dispense Refill  ? acetaminophen (TYLENOL) 500 MG tablet Take 1,000 mg by mouth every 6 (six) hours as needed (pain/headaches.).    ? ALPRAZolam (XANAX) 0.25 MG tablet Take 0.25 mg by mouth daily as needed.    ? amiodarone (PACERONE) 200 MG tablet Take 1 tablet (200 mg total) by mouth daily. 90 tablet 1  ? apixaban (ELIQUIS) 5 MG TABS tablet Take 1 tablet (5 mg total) by mouth 2 (two) times daily. 60 tablet 0  ? atorvastatin (LIPITOR) 40 MG tablet Take 1 tablet (40 mg total) by mouth at bedtime. 90 tablet 2  ? Cholecalciferol (VITAMIN D3 PO) Take 1 tablet  by mouth in the morning.    ? Cyanocobalamin (VITAMIN B-12 PO) Take 1 tablet by mouth in the morning.    ? escitalopram (LEXAPRO) 5 MG tablet Take 5 mg by mouth daily.    ? midodrine (PROAMATINE) 5 MG tablet Take 3 tablets (15 mg total) by mouth 3 (three) times daily. 270 tablet 2  ? nicotine (NICODERM CQ - DOSED IN MG/24 HOURS) 21 mg/24hr patch Place 1 patch (21 mg total) onto the skin daily. 28 patch 0  ? NON FORMULARY Take 1 each by mouth daily. Vitamin D + B12 gummy    ? ?No current facility-administered medications for this visit.  ? ? ?Allergies:   Codeine, Hydrocodone, Codeine, and Hydrocodone  ? ?Social History:  The patient  reports that she has been smoking cigarettes. She has a 15.50 pack-year smoking history. She has been exposed to tobacco smoke. She has never used smokeless tobacco. She reports current alcohol use of about 1.0 - 2.0 standard drink per week. She reports  that she does not use drugs.  ? ?Family History:  The patient's family history includes Diabetes in her mother; Heart failure in her mother; Hypertension in her mother.  ? ?ROS:  Please see the history of present illness.   Otherwise, review of systems is positive for none.   All other systems are reviewed and negative.  ? ?PHYSICAL EXAM: ?VS:  BP 100/62   Pulse (!) 48   Ht '5\' 6"'$  (1.676 m)   Wt 162 lb (73.5 kg)   SpO2 96%   BMI 26.15 kg/m?  , BMI Body mass index is 26.15 kg/m?. ?GEN: Well nourished, well developed, in no acute distress  ?HEENT: normal  ?Neck: no JVD, carotid bruits, or masses ?Cardiac: RRR; no murmurs, rubs, or gallops,no edema  ?Respiratory:  clear to auscultation bilaterally, normal work of breathing ?GI: soft, nontender, nondistended, + BS ?MS: no deformity or atrophy  ?Skin: warm and dry ?Neuro:  Strength and sensation are intact ?Psych: euthymic mood, full affect ? ?EKG:  EKG is not ordered today. ?Personal review of the ekg ordered 12/01/54 shows sinus rhythm rate 50  ? ?Recent Labs: ?11/12/2021: ALT  51 ?11/14/2021: Magnesium 1.8 ?11/15/2021: BUN 10; Creatinine, Ser 0.66; Hemoglobin 13.3; Platelets 183; Potassium 4.2; Sodium 137  ? ? ?Lipid Panel  ?   ?Component Value Date/Time  ? CHOL 137 11/13/2021 0600  ? TRIG 45 11/13/18

## 2021-12-21 ENCOUNTER — Ambulatory Visit: Payer: Managed Care, Other (non HMO)

## 2021-12-21 ENCOUNTER — Encounter: Payer: Managed Care, Other (non HMO) | Admitting: Occupational Therapy

## 2021-12-23 ENCOUNTER — Ambulatory Visit: Payer: Managed Care, Other (non HMO)

## 2021-12-27 ENCOUNTER — Encounter: Payer: Self-pay | Admitting: Cardiology

## 2021-12-27 DIAGNOSIS — D151 Benign neoplasm of heart: Secondary | ICD-10-CM

## 2021-12-28 ENCOUNTER — Ambulatory Visit: Payer: Managed Care, Other (non HMO)

## 2021-12-30 ENCOUNTER — Ambulatory Visit: Payer: Managed Care, Other (non HMO)

## 2022-01-04 ENCOUNTER — Ambulatory Visit: Payer: Managed Care, Other (non HMO)

## 2022-01-04 ENCOUNTER — Other Ambulatory Visit: Payer: Self-pay

## 2022-01-04 ENCOUNTER — Telehealth: Payer: Self-pay

## 2022-01-04 ENCOUNTER — Encounter: Payer: Managed Care, Other (non HMO) | Admitting: Occupational Therapy

## 2022-01-04 DIAGNOSIS — I4819 Other persistent atrial fibrillation: Secondary | ICD-10-CM

## 2022-01-04 DIAGNOSIS — D151 Benign neoplasm of heart: Secondary | ICD-10-CM

## 2022-01-04 MED ORDER — APIXABAN 5 MG PO TABS: 5.0000 mg | ORAL_TABLET | Freq: Two times a day (BID) | ORAL | 1 refills | Status: DC

## 2022-01-04 NOTE — Telephone Encounter (Signed)
Prescription refill request for Eliquis received. ?Indication:Afib  ?Last office visit:12/19/21 ([place]) ?Scr: 0.7 (12/06/21) ?Age: 51 ?Weight: 73.5kg ? ? ?Appropriate dose and refill sent to requested pharmacy.  ?

## 2022-01-04 NOTE — Telephone Encounter (Signed)
Order placed for cardiac CT and BMP ? ?Pt will get lab work at [facility] ? ?Instruction letter sent via mychart. ? ?Work up complete. ?

## 2022-01-05 ENCOUNTER — Other Ambulatory Visit
Admission: RE | Admit: 2022-01-05 | Discharge: 2022-01-05 | Disposition: A | Discharge status: Home / Self Care | Payer: Managed Care, Other (non HMO) | Attending: Cardiology | Admitting: Cardiology

## 2022-01-05 DIAGNOSIS — D151 Benign neoplasm of heart: Secondary | ICD-10-CM

## 2022-01-05 LAB — BASIC METABOLIC PANEL
Anion gap: 8 (ref 5–15)
BUN: 13 mg/dL (ref 6–20)
CO2: 29 mmol/L (ref 22–32)
Calcium: 8.7 mg/dL — ABNORMAL LOW (ref 8.9–10.3)
Chloride: 103 mmol/L (ref 98–111)
Creatinine, Ser: 0.57 mg/dL (ref 0.44–1.00)
GFR, Estimated: >60 mL/min (ref >60)
Glucose, Bld: 91 mg/dL (ref 70–99)
Potassium: 3 mmol/L — ABNORMAL LOW (ref 3.5–5.1)
Sodium: 140 mmol/L (ref 135–145)

## 2022-01-06 ENCOUNTER — Ambulatory Visit: Payer: Managed Care, Other (non HMO)

## 2022-01-11 ENCOUNTER — Ambulatory Visit: Payer: Managed Care, Other (non HMO)

## 2022-01-12 ENCOUNTER — Other Ambulatory Visit: Payer: Self-pay | Admitting: *Deleted

## 2022-01-12 DIAGNOSIS — E876 Hypokalemia: Secondary | ICD-10-CM

## 2022-01-13 ENCOUNTER — Ambulatory Visit: Payer: Managed Care, Other (non HMO)

## 2022-01-18 ENCOUNTER — Ambulatory Visit (INDEPENDENT_AMBULATORY_CARE_PROVIDER_SITE_OTHER): Payer: Commercial Managed Care - HMO | Admitting: Neurology

## 2022-01-18 ENCOUNTER — Ambulatory Visit: Payer: Managed Care, Other (non HMO)

## 2022-01-18 ENCOUNTER — Encounter: Payer: Self-pay | Admitting: Neurology

## 2022-01-18 VITALS — BP 142/80 | HR 58 | Ht 66.0 in | Wt 161.2 lb

## 2022-01-18 DIAGNOSIS — I63411 Cerebral infarction due to embolism of right middle cerebral artery: Secondary | ICD-10-CM

## 2022-01-18 DIAGNOSIS — G44209 Tension-type headache, unspecified, not intractable: Secondary | ICD-10-CM | POA: Diagnosis not present

## 2022-01-18 DIAGNOSIS — I639 Cerebral infarction, unspecified: Secondary | ICD-10-CM

## 2022-01-18 NOTE — Progress Notes (Signed)
?[facility] ?[address] ?[place]. [place] [address] ?[phone] ? ?     OFFICE CONSULT NOTE ? ?[name] ?Date of Birth:  [birth year] ?Medical Record Number:  [id number]  ? ?Referring MD:  Hannah Zimmerman ? ?Reason for Referral: Stroke ? ?HPI: Hannah Zimmerman is a 51 year old pleasant Caucasian lady seen today for initial office consultation visit for stroke.  History is obtained from the patient and her daughter as well as review of electronic medical records and opossum reviewed pertinent available imaging films in PACS.  She has past medical history of atrial fibrillation/flutter, congestive heart failure, tobacco abuse who presented to [facility] on 11/12/2021 with sudden onset of lethargy, left-sided weakness, slurred speech and gaze deviation.  NIH stroke scale was 24 on admission.  She was not a candidate for IV thrombolysis as she was on Eliquis.  Noncontrast CAT scan of the head showed aspect score of 10 with developing some hypodensity in the right hemisphere and dense right MCA sign.  CT angiogram of the head and neck showed nonopacification proximal to mid right ICA extending into the intracranial portion of the terminus and proximal right MCA.  Patient was emergently transferred to [facility] and underwent successful mechanical thrombectomy with TICI3 revascularization by Dr. Estanislado Pandy.  She was kept in the intensive care unit and closely monitored and blood pressure was tightly controlled.  She did very well and noticed significant improvement in the left-sided deficits.  Echocardiogram showed ejection fraction of 60 to 65% with severe concentric left ventricular hypertrophy.  LDL cholesterol was 87 mg percent.  Hemoglobin A1c of 6.1.  MRI scan of the brain shows patchy infarction in the right basal ganglia with some mild petechial hemorrhage.  There was also subtle diffusion signal in the adjacent right frontal and insular cortex.  Discussion  was made about switching Eliquis to alternative anticoagulants but patient's insurance co-pay would be quite high and hence it was decided to continue Eliquis.  Patient was discharged home with home physical and Occupational Therapy.  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  The patient states her left-sided deficits have mostly resolved.  She has cut back smoking but still smokes about 6 cigarettes a day.  She is complaining of chronic headaches since her stroke.  These are mostly in the back of the head and top of the neck they are mild to moderate dull in quality.  No accompanying light or sound sensitivity or nausea.  She takes Tylenol 2 tablets twice daily for last several months.  She does admit to significant increase stress in her life and she has not been participating in any stress laxation activity.  She does complain of decreased strength and difficulty with focusing and plans to see an eye doctor. ? ?ROS:   ?14 system review of systems is positive for headache, neck tightness, weakness all other systems negative ? ?PMH:  ?Past Medical History:  ?Diagnosis Date  ? (HFpEF) heart failure with preserved ejection fraction ([place])   ? a. 07/2019 Echo: EF 60-65%, mod LVH. Sev dil LA, mildly dil RA. Mod elev PASP; b. 07/2020 TEE: EF 50-55%, no rwma, sev conc LVH. Nl RV fxn. Mod dil RA. Mild-mod MR, mod TR.  ? Agatston coronary artery calcium score between 200 and 399   ? a. 07/2020 Cardiac CT: Cor Ca2+ = 338 (99th %'ile).  ? Complication of  anesthesia 20 years ago  ? a. spinal with first c section went too high stopped breathing, low bp with 2nd c section, no further issues with anesthesia  ? Heart murmur   ? a. 07/2019 Echo: no significant valvular dzs; b. 07/2020 TEE: Mild-mod MR, mod TR.  ? Hypertrophic cardiomyopathy ([place])   ? a. 07/2019 Echo: Mod LVH; b. 01/2020 cMRI: EF 67%, HCM w/o obstruction. Max wall thickness  66m. Sev LAE w/ L->R atrial level shunt. Large area of LGE @ mid-ventricular level in area of max wall thickness; c. 07/2020 Cardiac CT: Asymm hypertrophy up to 141min mid inferoseptum consistent w/ known HCM.  ? Morbid obesity ([facility]  ? a. 09/2014 s/p gastric bypass.  ? Persistent atrial fibrillation ([place]  ? a. Dx 07/2019-->CHA2DS2VASc = 2 (diast CHF/Fem)-->Eliquis; b. 09/2018 s/p DCCV (150J (biphasic) x 1); c. 12/2019 RFCA/PVI; d. 07/2020 repeat RFCA/PVI.  ? Rash   ? both arms from old bed bug bites, healing  ? [facility]  ? Tobacco abuse   ? Typical atrial flutter ([facility]  ? a. 12/2019 s/p RFCA.  ? ? ?Social History:  ?Social History  ? ?Socioeconomic History  ? Marital status: Single  ?  Spouse name: Not on file  ? Number of children: 3  ? Years of education: two years of college  ? Highest education level: Not on file  ?Occupational History  ? Occupation: ofSurveyor, minerals?Tobacco Use  ? Smoking status: Every Day  ?  Packs/day: 0.50  ?  Years: 31.00  ?  Pack years: 15.50  ?  Types: Cigarettes  ?  Passive exposure: Current  ? Smokeless tobacco: Never  ? Tobacco comments:  ?  10 cigarettes daily   ?Vaping Use  ? Vaping Use: Never used  ?Substance and Sexual Activity  ? Alcohol use: Yes  ?  Alcohol/week: 1.0 - 2.0 standard drink  ?  Types: 1 - 2 Standard drinks or equivalent per week  ?  Comment: Occaional use - 1-2 mixed drinks prior to stroke since d/c nothing 11/30/21  ? Drug use: No  ? Sexual activity: Yes  ?Other Topics Concern  ? Not on file  ?Social History Narrative  ? Lives at home with significant other.  ? Some caffeine daily I her green tea.  ? Right-handed.  ? ?Social Determinants of Health  ? ?Financial Resource Strain: Not on file  ?Food Insecurity: Not on file  ?Transportation Needs: No Transportation Needs  ? Lack of Transportation (Medical): No  ? Lack of Transportation (Non-Medical): No  ?Physical Activity: Not on file  ?Stress: Not on file  ?Social Connections: Not on file  ?Intimate Partner  Violence: Not on file  ? ? ?Medications:   ?Current Outpatient Medications on File Prior to Visit  ?Medication Sig Dispense Refill  ? acetaminophen (TYLENOL) 500 MG tablet Take 1,000 mg by mouth every 6 (six) hours as needed (pain/headaches.).    ? amiodarone (PACERONE) 200 MG tablet Take 1 tablet (200 mg total) by mouth daily. 90 tablet 1  ? apixaban (ELIQUIS) 5 MG TABS tablet Take 1 tablet (5 mg total) by mouth 2 (two) times daily. 180 tablet 1  ? atorvastatin (LIPITOR) 40 MG tablet Take 1 tablet (40 mg total) by mouth at bedtime. 90 tablet 2  ? Cholecalciferol (VITAMIN D3 PO) Take 1 tablet by mouth in the morning.    ? Cyanocobalamin (VITAMIN B-12 PO) Take 1 tablet by mouth in the morning.    ?  escitalopram (LEXAPRO) 5 MG tablet Take 5 mg by mouth daily.    ? midodrine (PROAMATINE) 5 MG tablet Take 3 tablets (15 mg total) by mouth 3 (three) times daily. 270 tablet 2  ? NON FORMULARY Take 1 each by mouth daily. Vitamin D + B12 gummy    ? traZODone (DESYREL) 50 MG tablet Take 50 mg by mouth at bedtime.    ? ?No current facility-administered medications on file prior to visit.  ? ? ?Allergies:   ?Allergies  ?Allergen Reactions  ? Codeine   ? Hydrocodone   ? Codeine Hives  ?   ?  ? Hydrocodone Hives  ? ? ?Physical Exam ?General: well developed, well nourished, seated, in no evident distress ?Head: head normocephalic and atraumatic.   ?Neck: supple with no carotid or supraclavicular bruits ?Cardiovascular: regular rate and rhythm, no murmurs ?Musculoskeletal: no deformity ?Skin:  no rash/petichiae ?Vascular:  Normal pulses all extremities ? ?Neurologic Exam ?Mental Status: Awake and fully alert. Oriented to place and time. Recent and remote memory intact. Attention span, concentration and fund of knowledge appropriate. Mood and affect appropriate.  ?Cranial Nerves: Fundoscopic exam reveals sharp disc margins. Pupils equal, briskly reactive to light. Extraocular movements full without nystagmus. Visual fields full to  confrontation. Hearing intact. Facial sensation intact. Face, tongue, palate moves normally and symmetrically.  ?Motor: Normal bulk and tone. Normal strength in all tested extremity muscles.  Diminishe

## 2022-01-18 NOTE — Patient Instructions (Signed)
I had a long d/w patient  and her daughter about her recent embolic stroke, atrial fibrillation,risk for recurrent stroke/TIAs, personally independently reviewed imaging studies and stroke evaluation results and answered questions.Continue Eliquis (apixaban) daily  for secondary stroke prevention and maintain strict control of hypertension with blood pressure goal below 130/90, diabetes with hemoglobin A1c goal below 6.5% and lipids with LDL cholesterol goal below 70 mg/dL. I also advised the patient to eat a healthy diet with plenty of whole grains, cereals, fruits and vegetables, exercise regularly and maintain ideal body weight .check follow-up lipid profile.  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  She also complains of tension headaches I recommend she increase participation in regular activities for stress relaxation like meditation and yoga.  We also advised her to do regular neck stretching exercises and local heat application to her stiff neck muscles.  I advised her to discontinue Tylenol as she is taking too much for too long and this may be contributing to rebound headache.  Followup in the future with me in 6 months or call earlier if necessary. ?Neck Exercises ?Ask your health care provider which exercises are safe for you. Do exercises exactly as told by your health care provider and adjust them as directed. It is normal to feel mild stretching, pulling, tightness, or discomfort as you do these exercises. Stop right away if you feel sudden pain or your pain gets worse. Do not begin these exercises until told by your health care provider. ?Neck exercises can be important for many reasons. They can improve strength and maintain flexibility in your neck, which will help your upper back and prevent neck pain. ?Stretching exercises ?Rotation neck  stretching ? ?Sit in a chair or stand up. ?Place your feet flat on the floor, shoulder-width apart. ?Slowly turn your head (rotate) to the right until a slight stretch is felt. Turn it all the way to the right so you can look over your right shoulder. Do not tilt or tip your head. ?Hold this position for 10-30 seconds. ?Slowly turn your head (rotate) to the left until a slight stretch is felt. Turn it all the way to the left so you can look over your left shoulder. Do not tilt or tip your head. ?Hold this position for 10-30 seconds. ?Repeat __________ times. Complete this exercise __________ times a day. ?Neck retraction ? ?Sit in a sturdy chair or stand up. ?Look straight ahead. Do not bend your neck. ?Use your fingers to push your chin backward (retraction). Do not bend your neck for this movement. Continue to face straight ahead. If you are doing the exercise properly, you will feel a slight sensation in your throat and a stretch at the back of your neck. ?Hold the stretch for 1-2 seconds. ?Repeat __________ times. Complete this exercise __________ times a day. ?Strengthening exercises ?Neck press ? ?Lie on your back on a firm bed or on the floor with a pillow under your head. ?Use your neck muscles to push your head down on the pillow and straighten your spine. ?Hold the position as well as you can. Keep your head facing up (in a neutral position) and your chin tucked. ?Slowly count to 5 while holding this position. ?Repeat __________ times. Complete this exercise __________ times a day. ?Isometrics ?These are exercises in which you strengthen the muscles in your  neck while keeping your neck still (isometrics). ?Sit in a supportive chair and place your hand on your forehead. ?Keep your head and face facing straight ahead. Do not flex or extend your neck while doing isometrics. ?Push forward with your head and neck while pushing back with your hand. Hold for 10 seconds. ?Do the sequence again, this time putting  your hand against the back of your head. Use your head and neck to push backward against the hand pressure. ?Finally, do the same exercise on either side of your head, pushing sideways against the pressure of your hand. ?Repeat __________ times. Complete this exercise __________ times a day. ?Prone head lifts ? ?Lie face-down (prone position), resting on your elbows so that your chest and upper back are raised. ?Start with your head facing downward, near your chest. Position your chin either on or near your chest. ?Slowly lift your head upward. Lift until you are looking straight ahead. Then continue lifting your head as far back as you can comfortably stretch. ?Hold your head up for 5 seconds. Then slowly lower it to your starting position. ?Repeat __________ times. Complete this exercise __________ times a day. ?Supine head lifts ? ?Lie on your back (supine position), bending your knees to point to the ceiling and keeping your feet flat on the floor. ?Lift your head slowly off the floor, raising your chin toward your chest. ?Hold for 5 seconds. ?Repeat __________ times. Complete this exercise __________ times a day. ?Scapular retraction ? ?Stand with your arms at your sides. Look straight ahead. ?Slowly pull both shoulders (scapulae) backward and downward (retraction) until you feel a stretch between your shoulder blades in your upper back. ?Hold for 10-30 seconds. ?Relax and repeat. ?Repeat __________ times. Complete this exercise __________ times a day. ?Contact a health care provider if: ?Your neck pain or discomfort gets worse when you do an exercise. ?Your neck pain or discomfort does not improve within 2 hours after you exercise. ?If you have any of these problems, stop exercising right away. Do not do the exercises again unless your health care provider says that you can. ?Get help right away if: ?You develop sudden, severe neck pain. ?If this happens, stop exercising right away. Do not do the exercises  again unless your health care provider says that you can. ?This information is not intended to replace advice given to you by your health care provider. Make sure you discuss any questions you have with your health care provider. ?Document Revised: 03/01/2021 Document Reviewed: 03/01/2021 ?Elsevier Patient Education ? [employer]. ? ?Stroke Prevention ?Some medical conditions and behaviors can lead to a higher chance of having a stroke. You can help prevent a stroke by eating healthy, exercising, not smoking, and managing any medical conditions you have. ?Stroke is a leading cause of functional impairment. Primary prevention is particularly important because a majority of strokes are first-time events. Stroke changes the lives of not only those who experience a stroke but also their family and other caregivers. ?How can this condition affect me? ?A stroke is a medical emergency and should be treated right away. A stroke can lead to brain damage and can sometimes be life-threatening. If a person gets medical treatment right away, there is a better chance of surviving and recovering from a stroke. ?What can increase my risk? ?The following medical conditions may increase your risk of a stroke: ?Cardiovascular disease. ?High blood pressure (hypertension). ?Diabetes. ?High cholesterol. ?Sickle cell disease. ?Blood clotting disorders (hypercoagulable state). ?Obesity. ?  Sleep disorders (obstructive sleep apnea). ?Other risk factors include: ?Being older than age 22. ?Having a history of blood clots, stroke, or mini-stroke (transient ischemic attack, TIA). ?Genetic factors, such as race, ethnicity, or a family history of stroke. ?Smoking cigarettes or using other tobacco products. ?Taking birth control pills, especially if you also use tobacco. ?Heavy use of alcohol or drugs, especially cocaine and methamphetamine. ?Physical inactivity. ?What actions can I take to prevent this? ?Manage your health conditions ?High  cholesterol levels. ?Eating a healthy diet is important for preventing high cholesterol. If cholesterol cannot be managed through diet alone, you may need to take medicines. ?Take any prescribed medicines to

## 2022-01-19 ENCOUNTER — Other Ambulatory Visit
Admission: RE | Admit: 2022-01-19 | Discharge: 2022-01-19 | Disposition: A | Discharge status: Home / Self Care | Payer: PRIVATE HEALTH INSURANCE | Attending: Cardiology | Admitting: Cardiology

## 2022-01-19 DIAGNOSIS — D151 Benign neoplasm of heart: Secondary | ICD-10-CM | POA: Insufficient documentation

## 2022-01-19 LAB — BASIC METABOLIC PANEL
Anion gap: 9 (ref 5–15)
BUN: 13 mg/dL (ref 6–20)
CO2: 30 mmol/L (ref 22–32)
Calcium: 9 mg/dL (ref 8.9–10.3)
Chloride: 102 mmol/L (ref 98–111)
Creatinine, Ser: 0.7 mg/dL (ref 0.44–1.00)
GFR, Estimated: >60 mL/min (ref >60)
Glucose, Bld: 82 mg/dL (ref 70–99)
Potassium: 3.5 mmol/L (ref 3.5–5.1)
Sodium: 141 mmol/L (ref 135–145)

## 2022-01-19 LAB — LIPID PANEL
Chol/HDL Ratio: 2.6 ratio (ref 0.0–4.4)
Cholesterol, Total: 128 mg/dL (ref 100–199)
HDL: 50 mg/dL (ref >39)
LDL Chol Calc (NIH): 64 mg/dL (ref 0–99)
Triglycerides: 65 mg/dL (ref 0–149)
VLDL Cholesterol Cal: 14 mg/dL (ref 5–40)

## 2022-01-20 ENCOUNTER — Ambulatory Visit: Payer: Managed Care, Other (non HMO)

## 2022-01-20 ENCOUNTER — Encounter: Payer: Managed Care, Other (non HMO) | Admitting: Occupational Therapy

## 2022-01-22 ENCOUNTER — Other Ambulatory Visit: Payer: Self-pay | Admitting: Cardiovascular Disease

## 2022-01-22 DIAGNOSIS — I4819 Other persistent atrial fibrillation: Secondary | ICD-10-CM

## 2022-01-23 NOTE — Telephone Encounter (Signed)
Prescription refill request for Eliquis received. ?Indication: Atrial Fib ?Last office visit: 12/19/21  Elliot Cousin MD ?Scr: 0.70 on 01/19/22 ?Age:  51 ?Weight: 73.5kg ? ?Based on above findings Eliquis '5mg'$  twice daily is the appropriate dose.  Refill approved. ? ?

## 2022-01-23 NOTE — Telephone Encounter (Signed)
Refill request

## 2022-01-25 ENCOUNTER — Ambulatory Visit: Payer: Managed Care, Other (non HMO)

## 2022-01-25 ENCOUNTER — Encounter: Payer: Managed Care, Other (non HMO) | Admitting: Occupational Therapy

## 2022-01-26 ENCOUNTER — Telehealth (HOSPITAL_COMMUNITY): Payer: Self-pay | Admitting: *Deleted

## 2022-01-26 NOTE — Telephone Encounter (Signed)
Reaching out to patient to offer assistance regarding upcoming cardiac imaging study; pt verbalizes understanding of appt date/time, parking situation and where to check in, pre-test NPO status and verified current allergies; name and call back number provided for further questions should they arise ? ?Gordy Clement RN Navigator Cardiac Imaging ?[facility] ?[phone] office ?[phone] cell ? ?Patient to take her daily medications and is aware to arrive at 2:45pm. ?

## 2022-01-27 ENCOUNTER — Ambulatory Visit (HOSPITAL_COMMUNITY)
Admission: RE | Admit: 2022-01-27 | Discharge: 2022-01-27 | Disposition: A | Discharge status: Home / Self Care | Payer: Commercial Managed Care - HMO | Source: Ambulatory Visit | Attending: Cardiology | Admitting: Cardiology

## 2022-01-27 ENCOUNTER — Ambulatory Visit: Payer: Managed Care, Other (non HMO)

## 2022-01-27 DIAGNOSIS — D151 Benign neoplasm of heart: Secondary | ICD-10-CM | POA: Diagnosis present

## 2022-01-27 MED ORDER — IOHEXOL 350 MG/ML SOLN
100.0000 mL | Freq: Once | INTRAVENOUS | Status: AC | PRN
Start: 2022-01-27 — End: 2022-01-27
  Administered 2022-01-27: 100 mL via INTRAVENOUS

## 2022-01-31 ENCOUNTER — Encounter: Payer: Self-pay | Admitting: Cardiology

## 2022-02-01 ENCOUNTER — Encounter: Payer: Managed Care, Other (non HMO) | Admitting: Occupational Therapy

## 2022-02-01 ENCOUNTER — Ambulatory Visit: Payer: Managed Care, Other (non HMO)

## 2022-02-03 ENCOUNTER — Ambulatory Visit: Payer: Managed Care, Other (non HMO)

## 2022-02-08 ENCOUNTER — Encounter: Payer: Self-pay | Admitting: Cardiology

## 2022-02-08 ENCOUNTER — Ambulatory Visit: Payer: Managed Care, Other (non HMO)

## 2022-02-08 ENCOUNTER — Encounter: Payer: Managed Care, Other (non HMO) | Admitting: Occupational Therapy

## 2022-02-10 ENCOUNTER — Telehealth: Payer: Self-pay | Admitting: Cardiology

## 2022-02-10 ENCOUNTER — Ambulatory Visit: Payer: Managed Care, Other (non HMO)

## 2022-02-10 DIAGNOSIS — D151 Benign neoplasm of heart: Secondary | ICD-10-CM

## 2022-02-10 NOTE — Telephone Encounter (Signed)
Pt aware referral will be placed today. She appreciates my return call.

## 2022-02-10 NOTE — Telephone Encounter (Signed)
Follow Up:     Patient says she needs to talk to [facility] please. This is concerning her CT results from 01-27-22.

## 2022-02-15 ENCOUNTER — Ambulatory Visit: Payer: Managed Care, Other (non HMO) | Admitting: Physician Assistant

## 2022-02-15 ENCOUNTER — Ambulatory Visit: Payer: Managed Care, Other (non HMO)

## 2022-02-17 ENCOUNTER — Ambulatory Visit: Payer: Managed Care, Other (non HMO)

## 2022-02-21 ENCOUNTER — Telehealth: Payer: Self-pay | Admitting: Cardiovascular Disease

## 2022-02-21 NOTE — Telephone Encounter (Signed)
3 attempts to schedule fu appt from recall list.   Deleting recall.   

## 2022-02-23 ENCOUNTER — Other Ambulatory Visit: Payer: Self-pay

## 2022-02-23 NOTE — Patient Outreach (Signed)
[facility]) Care Management  02/23/2022  SREYA FROIO [birth year] [id number]   First telephone outreach attempt to obtain mRS. No answer. Left message for returned call.  Phil[facility] Pal[facility] Management Assistant [phone]

## 2022-02-23 NOTE — Patient Outreach (Signed)
[facility]) Care Management  02/23/2022  Hannah Zimmerman Jul 25, 1971 [id number]   Telephone outreach to patient to obtain mRS was successfully completed. MRS=1  [place] Care Management Assistant [phone]

## 2022-02-27 ENCOUNTER — Encounter: Payer: Self-pay | Admitting: Thoracic Surgery (Cardiothoracic Vascular Surgery)

## 2022-02-27 ENCOUNTER — Institutional Professional Consult (permissible substitution) (INDEPENDENT_AMBULATORY_CARE_PROVIDER_SITE_OTHER): Payer: PRIVATE HEALTH INSURANCE | Admitting: Thoracic Surgery (Cardiothoracic Vascular Surgery)

## 2022-02-27 ENCOUNTER — Other Ambulatory Visit: Payer: Self-pay | Admitting: *Deleted

## 2022-02-27 VITALS — BP 122/81 | HR 55 | Resp 20 | Ht 64.0 in | Wt 144.0 lb

## 2022-02-27 DIAGNOSIS — D151 Benign neoplasm of heart: Secondary | ICD-10-CM

## 2022-02-27 NOTE — Progress Notes (Signed)
PCP is Whitaker, Rolanda Jay, PA-C Referring Provider is Constance Haw, MD  Chief Complaint  Patient presents with   Consult    TEE 2/21, ECHO 2/26, CT coronary 5/12    HPI: [name] sent for consultation regarding a papillary fibroelastoma of the aortic valve.  Hannah Zimmerman is a 51 year old woman with a past history significant for stroke, tumor of the aortic valve, persistent atrial fibrillation, pulmonary hypertension, hypertrophic cardiomyopathy without obstruction, morbid obesity status post gastric bypass surgery, and tobacco abuse.  She has had atrial fibrillation for about 4 years.  She has been on Eliquis.  In February she had a cardioversion on Tuesday.  The following Saturday she had a stroke.  There was characterized by slurring of speech, right gaze deviation, and left-sided facial droop, neglect, and left hemiplegia.  She underwent endovascular thrombectomy.  She had recovery of most of her neurologic deficits but does still have some residual effects on speech, balance, coordination, and holding objects.  During her evaluation she had a TEE which showed a 6 mm tumor on the ventricular side of the noncoronary cusp of the aortic valve consistent with a papillary fibroelastoma.   Past Medical History:  Diagnosis Date   (HFpEF) heart failure with preserved ejection fraction ([place])    a. 07/2019 Echo: EF 60-65%, mod LVH. Sev dil LA, mildly dil RA. Mod elev PASP; b. 07/2020 TEE: EF 50-55%, no rwma, sev conc LVH. Nl RV fxn. Mod dil RA. Mild-mod MR, mod TR.   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 07/2020 Cardiac CT: Cor Ca2+ = 338 (99th %'ile).   Complication of anesthesia 20 years ago   a. spinal with first c section went too high stopped breathing, low bp with 2nd c section, no further issues with anesthesia   Heart murmur    a. 07/2019 Echo: no significant valvular dzs; b. 07/2020 TEE: Mild-mod MR, mod TR.   Hypertrophic cardiomyopathy  ([place])    a. 07/2019 Echo: Mod LVH; b. 01/2020 cMRI: EF 67%, HCM w/o obstruction. Max wall thickness 64m. Sev LAE w/ L->R atrial level shunt. Large area of LGE @ mid-ventricular level in area of max wall thickness; c. 07/2020 Cardiac CT: Asymm hypertrophy up to 181min mid inferoseptum consistent w/ known HCM.   Morbid [place] ([facility] [place]   a. 09/2014 s/p gastric bypass.   Persistent atrial fibrillation ([facility]   a. Dx 07/2019-->CHA2DS2VASc = 2 (diast CHF/Fem)-->Eliquis; b. 09/2018 s/p DCCV (150J (biphasic) x 1); c. 12/2019 RFCA/PVI; d. 07/2020 repeat RFCA/PVI.   Rash    both arms from old bed bug bites, healing   [place]   Tobacco abuse    Typical atrial flutter ([facility]   a. 12/2019 s/p RFCA.    Past Surgical History:  Procedure Laterality Date   ABDOMINAL HYSTERECTOMY     ANKLE SURGERY     left x 2   ATRIAL FIBRILLATION ABLATION N/A 12/24/2019   Procedure: ATRIAL FIBRILLATION ABLATION;  Surgeon: CaConstance HawMD;  [facility];  Service: Cardiovascular;  Laterality: N/A;   ATRIAL FIBRILLATION ABLATION N/A 08/11/2020   Procedure: ATRIAL FIBRILLATION ABLATION;  Surgeon: CaConstance HawMD;  [facility];  Service: Cardiovascular;  Laterality: N/A;   CARDIOVERSION N/A 09/24/2019   Procedure: CARDIOVERSION;  Surgeon: GoMinna MerrittsMD;  Location: [facility];  Service: Cardiovascular;  Laterality: N/A;   CARDIOVERSION N/A 10/17/2019   Procedure: CARDIOVERSION;  Surgeon: Minna Merritts, MD;  Location: [facility];  Service: Cardiovascular;  Laterality: N/A;   CARDIOVERSION N/A 11/08/2021   Procedure: CARDIOVERSION;  Surgeon: Minna Merritts, MD;  Location: [facility];  Service: Cardiovascular;  Laterality: N/A;   CESAREAN SECTION     x 3   IR ANGIO EXTRACRAN SEL COM CAROTID INNOMINATE UNI L MOD SED  11/13/2021   IR CT HEAD LTD  11/13/2021   IR PERCUTANEOUS ART THROMBECTOMY/INFUSION INTRACRANIAL INC DIAG ANGIO  11/13/2021   LAPAROSCOPIC GASTRIC SLEEVE  RESECTION WITH HIATAL HERNIA REPAIR  08/17/2015   Procedure: LAPAROSCOPIC GASTRIC SLEEVE RESECTION WITH HIATAL HERNIA REPAIR;  Surgeon: Johnathan Hausen, MD;  Location: [facility];  Service: General;;   RADIOLOGY WITH ANESTHESIA N/A 11/12/2021   Procedure: IR WITH ANESTHESIA;  Surgeon: Luanne Bras, MD;  Location: [facility];  Service: Radiology;  Laterality: N/A;   TEE WITHOUT CARDIOVERSION N/A 08/11/2020   Procedure: TRANSESOPHAGEAL ECHOCARDIOGRAM (TEE);  Surgeon: Constance Haw, MD;  Location: [facility];  Service: Cardiovascular;  Laterality: N/A;   TEE WITHOUT CARDIOVERSION N/A 11/08/2021   Procedure: TRANSESOPHAGEAL ECHOCARDIOGRAM (TEE);  Surgeon: Minna Merritts, MD;  Location: [facility];  Service: Cardiovascular;  Laterality: N/A;   TUBAL LIGATION     x 2   UPPER GI ENDOSCOPY  08/17/2015   Procedure: UPPER GI ENDOSCOPY;  Surgeon: Johnathan Hausen, MD;  Location: [facility];  Service: General;;    Family History  Problem Relation Age of Onset   Hypertension Mother    Diabetes Mother    Heart failure Mother     Social History Social History   Tobacco Use   Smoking status: Every Day    Packs/day: 0.50    Years: 31.00    Total pack years: 15.50    Types: Cigarettes    Passive exposure: Current   Smokeless tobacco: Never   Tobacco comments:    5 cigarettes daily   Vaping Use   Vaping Use: Never used  Substance Use Topics   Alcohol use: Yes    Alcohol/week: 1.0 - 2.0 standard drink of alcohol    Types: 1 - 2 Standard drinks or equivalent per week    Comment: Occaional use - 1-2 mixed drinks prior to stroke since d/c nothing 11/30/21   Drug use: No    Current Outpatient Medications  Medication Sig Dispense Refill   acetaminophen (TYLENOL) 500 MG tablet Take 1,000 mg by mouth every 6 (six) hours as needed (pain/headaches.).     amiodarone (PACERONE) 200 MG tablet Take 1 tablet (200 mg total) by mouth daily. 90 tablet 1   atorvastatin (LIPITOR) 40 MG tablet Take 1  tablet (40 mg total) by mouth at bedtime. 90 tablet 2   Cholecalciferol (VITAMIN D3 PO) Take 1 tablet by mouth in the morning.     Cyanocobalamin (VITAMIN B-12 PO) Take 1 tablet by mouth in the morning.     ELIQUIS 5 MG TABS tablet TAKE 1 TABLET(5 MG) BY MOUTH TWICE DAILY 180 tablet 1   escitalopram (LEXAPRO) 5 MG tablet Take 10 mg by mouth daily.     midodrine (PROAMATINE) 5 MG tablet Take 3 tablets (15 mg total) by mouth 3 (three) times daily. (Patient taking differently: Take 10 mg by mouth in the morning and at bedtime. Takes 2 tablets BID) 270 tablet 2   NON FORMULARY Take 1 each by mouth daily. Vitamin D + B12 gummy     traZODone (DESYREL) 50 MG tablet Take 50 mg  by mouth at bedtime.     No current facility-administered medications for this visit.    Allergies  Allergen Reactions   Codeine    Hydrocodone    Codeine Hives        Hydrocodone Hives    Review of Systems  Respiratory:  Negative for shortness of breath.   Cardiovascular:  Positive for chest pain (Occasional sharp pains). Negative for leg swelling.  Neurological:  Positive for dizziness, speech difficulty (Mild, significantly improved), weakness (Holding objects) and headaches.  Hematological:  Bruises/bleeds easily.  All other systems reviewed and are negative.   BP 122/81 (BP Location: Right Arm, Patient Position: Sitting, Cuff Size: Normal)   Pulse (!) 55   Resp 20   Ht '5\' 4"'$  (1.626 m)   Wt 144 lb (65.3 kg)   SpO2 98%   BMI 24.72 kg/m  Physical Exam Vitals reviewed.  Constitutional:      General: She is not in acute distress.    Appearance: Normal appearance.  HENT:     Head: Normocephalic and atraumatic.  Eyes:     Extraocular Movements: Extraocular movements intact.     Pupils: Pupils are equal, round, and reactive to light.  Neck:     Vascular: No carotid bruit.  Cardiovascular:     Rate and Rhythm: Regular rhythm. Bradycardia present.     Heart sounds: No murmur heard.    No friction rub. No  gallop.  Pulmonary:     Effort: Pulmonary effort is normal. No respiratory distress.     Breath sounds: Normal breath sounds. No wheezing.  Abdominal:     General: There is no distension.     Palpations: Abdomen is soft.  Musculoskeletal:     Cervical back: Neck supple.  Skin:    General: Skin is warm and dry.  Neurological:     Mental Status: She is alert and oriented to person, place, and time.     Cranial Nerves: No cranial nerve deficit.     Motor: No weakness.     Comments: Hesitancy with speech     Diagnostic Tests: Cardiac/Coronary CTA   TECHNIQUE: The patient was scanned on a [employer].   PROTOCOL: A 110 kV retrospective scan was triggered in the descending thoracic aorta at 111 HU's. Axial non-contrast 3 mm slices were carried out through the heart. The data set was analyzed on a dedicated work station and scored using the [place]. Gantry rotation speed was 250 msecs and collimation was .6 mm. Beta blockade and 0.8 mg of sl NTG was given. The 3D data set was reconstructed in 10% intervals of the 0-90 % of the R-R cycle. Diastolic phases were analyzed on a dedicated work station using MPR, MIP and VRT modes. The patient received 150m OMNIPAQUE IOHEXOL 350 MG/ML SOLN of contrast.   FINDINGS: Quality: Good, HR 56   Coronary calcium score: The patient's coronary artery calcium score is 393, which places the patient in the 99th percentile.   Coronary arteries: Normal coronary origins. Right dominance. Evaluation was limited as nitroglycerin was not administered.   Right Coronary Artery: Dominant. Minimal mixed 1-24% proximal stenosis (CADRADS1).   Left Main Coronary Artery: Normal. Bifurcates into the LAD and LCx arteries.   Left Anterior Descending Coronary Artery: Minimal mixed proximal 1-24% stenosis (CADRADS1). The distal LAD is not visualized.   Left Circumflex Artery: Large AV groove vessel with minimal mixed 1-24% proximal stenosis  (CADRADS1). There is a large distal OM1 branch without disease.  Aorta: Normal size, 30 mm at the mid ascending aorta (level of the PA bifurcation) measured double oblique. No calcifications. No dissection.   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   Other findings:   Normal pulmonary vein drainage into the left atrium.   Normal left atrial appendage without a thrombus.   Significantly dilated main pulmonary artery to 40 mm, suggestive of pulmonary hypertension.   Severe proximal ventricular septal thickening c/w diagnosis of HCM.   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   IMPRESSION: 1. Minimal mixed non-obstructive CAD, CADRADS = 1 (findings limited due to lack of nitroglycerin administration).   2. Coronary calcium score of 393. This was 99th percentile for age and sex matched control.   3. Normal coronary origin with right dominance.   4. 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   5. Significantly dilated main pulmonary artery to 40 mm, suggestive of pulmonary hypertension.   6. Severe proximal ventricular septal thickening c/w diagnosis of HCM.   7. 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   Electronically Signed: By: Pixie Casino M.D. On: 01/27/2022 15:41   OVER-READ INTERPRETATION  CT CHEST   The following report is an over-read performed by radiologist Dr. [facility] on 01/27/2022. This over-read does not include interpretation of cardiac or coronary anatomy or pathology. The coronary CTA interpretation by the cardiologist is attached.   COMPARISON:  Chest CT 07/30/2019   FINDINGS: Normal caliber thoracic aorta.  No dissection.    Significant enlargement of the pulmonary arterial trunk measuring 4.2 cm. The right and left main pulmonary arteries are also prominent. Findings consistent with pulmonary hypertension.   No mediastinal or hilar mass or lymphadenopathy. The esophagus is grossly normal. There is a small hiatal hernia noted.   The lungs are clear of an acute process. No worrisome pulmonary lesions or pulmonary nodules.   No significant upper abdominal findings. Surgical changes gastric surgery.   No significant bony findings.   IMPRESSION: 1. Enlarged pulmonary artery suggesting pulmonary hypertension. 2. No mediastinal or hilar mass or adenopathy. 3. No significant pulmonary findings.     Electronically Signed   By: Marijo Sanes M.D.   On: 01/27/2022 15:57  Echocardiogram 11/13/2021 IMPRESSIONS     1. Left ventricular ejection fraction, by estimation, is 60 to 65%. The  left ventricle has normal function. The left ventricle has no regional  wall motion abnormalities. There is severe concentric left ventricular  hypertrophy. Indeterminate diastolic  filling due to E-A fusion. Elevated left ventricular end-diastolic  pressure.   2. Right ventricular systolic function is normal. The right ventricular  size is moderately enlarged.   3. Right atrial size was moderately dilated.   4. Left atrial size was severely dilated.   5. The mitral valve is normal in structure. Mild mitral valve  regurgitation. No evidence of mitral stenosis.   6. There is a soft tissue density on the noncoronary cusp extending into  the LVOT. 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 Differential dx includes  endocarditis (in right clinical setting) or valvular myxmoa. Images from  TEE done 11/08/21 demonstrate the   same soft tissue density (images 34-37 and 42 on TEE). There is  also a  prominent nodule of Arantius on the [facility] that is normal finding.   7. Recommend cMRI for further tissue evaluation of the aortic valve  mass.   I personally reviewed the CT and echocardiogram images.  Concur with findings of concentric left ventricular hypertrophy, aortic valve nodule, possible PFO, enlarged pulmonary artery, biatrial enlargement left greater than right.  Impression: Hannah Zimmerman is a 51 year old woman with a past history significant for stroke, tumor of the aortic valve, persistent atrial fibrillation, pulmonary hypertension, hypertrophic cardiomyopathy without obstruction, morbid obesity status post gastric bypass surgery, and tobacco abuse.  She had a stroke in February involving her right MCA.  Potential sources include atrial fibrillation with recent cardioversion and the tumor on her aortic valve.  It is possible that neither of these issues were involved.  However, I suspect it was  one or the other.  We discussed the possibility of surgical resection of the aortic valve tumor along with a Maze procedure, left atrial clipping, and closure of PFO if one is present.  I informed her of the general nature of the procedure including the incisions to be used, the use of cardiopulmonary bypass, the use of temporary pacemaker wires and drainage tubes postoperatively, the expected hospital stay, and the overall recovery.  I informed her of the indications, risks, benefits, and alternatives.  She understands the risks include, but not limited to death, MI, DVT, PE, bleeding, possible need for transfusion, infection, cardiac arrhythmias, complete heart block requiring pacemaker placement, as well as possibility of other unforeseeable complications such as renal respiratory failure or gastrointestinal complications.  She understands there is no guarantee that she would not have another stroke.  She wishes to proceed.     Plan: Median sternotomy for resection of aortic valve tumor, possible AVR, Maze procedure, left atrial appendage clip, and possible PFO closure Hold Eliquis 5 days prior to surgery.  Melrose Nakayama, MD [facility] [phone]

## 2022-02-27 NOTE — H&P (View-Only) (Signed)
PCP is Whitaker, Rolanda Jay, PA-C Referring Provider is Constance Haw, MD  Chief Complaint  Patient presents with   Consult    TEE 2/21, ECHO 2/26, CT coronary 5/12    HPI: [name] sent for consultation regarding a papillary fibroelastoma of the aortic valve.  Hannah Zimmerman is a 51 year old woman with a past history significant for stroke, tumor of the aortic valve, persistent atrial fibrillation, pulmonary hypertension, hypertrophic cardiomyopathy without obstruction, morbid obesity status post gastric bypass surgery, and tobacco abuse.  She has had atrial fibrillation for about 4 years.  She has been on Eliquis.  In February she had a cardioversion on Tuesday.  The following Saturday she had a stroke.  There was characterized by slurring of speech, right gaze deviation, and left-sided facial droop, neglect, and left hemiplegia.  She underwent endovascular thrombectomy.  She had recovery of most of her neurologic deficits but does still have some residual effects on speech, balance, coordination, and holding objects.  During her evaluation she had a TEE which showed a 6 mm tumor on the ventricular side of the noncoronary cusp of the aortic valve consistent with a papillary fibroelastoma.   Past Medical History:  Diagnosis Date   (HFpEF) heart failure with preserved ejection fraction ([place])    a. 07/2019 Echo: EF 60-65%, mod LVH. Sev dil LA, mildly dil RA. Mod elev PASP; b. 07/2020 TEE: EF 50-55%, no rwma, sev conc LVH. Nl RV fxn. Mod dil RA. Mild-mod MR, mod TR.   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 07/2020 Cardiac CT: Cor Ca2+ = 338 (99th %'ile).   Complication of anesthesia 20 years ago   a. spinal with first c section went too high stopped breathing, low bp with 2nd c section, no further issues with anesthesia   Heart murmur    a. 07/2019 Echo: no significant valvular dzs; b. 07/2020 TEE: Mild-mod MR, mod TR.   Hypertrophic cardiomyopathy  ([place])    a. 07/2019 Echo: Mod LVH; b. 01/2020 cMRI: EF 67%, HCM w/o obstruction. Max wall thickness 51m. Sev LAE w/ L->R atrial level shunt. Large area of LGE @ mid-ventricular level in area of max wall thickness; c. 07/2020 Cardiac CT: Asymm hypertrophy up to 173min mid inferoseptum consistent w/ known HCM.   Morbid [place] ([place]   a. 09/2014 s/p gastric bypass.   Persistent atrial fibrillation ([facility]   a. Dx 07/2019-->CHA2DS2VASc = 2 (diast CHF/Fem)-->Eliquis; b. 09/2018 s/p DCCV (150J (biphasic) x 1); c. 12/2019 RFCA/PVI; d. 07/2020 repeat RFCA/PVI.   Rash    both arms from old bed bug bites, healing   [facility]   Tobacco abuse    Typical atrial flutter ([facility]   a. 12/2019 s/p RFCA.    Past Surgical History:  Procedure Laterality Date   ABDOMINAL HYSTERECTOMY     ANKLE SURGERY     left x 2   ATRIAL FIBRILLATION ABLATION N/A 12/24/2019   Procedure: ATRIAL FIBRILLATION ABLATION;  Surgeon: CaConstance HawMD;  [facility];  Service: Cardiovascular;  Laterality: N/A;   ATRIAL FIBRILLATION ABLATION N/A 08/11/2020   Procedure: ATRIAL FIBRILLATION ABLATION;  Surgeon: CaConstance HawMD;  [facility];  Service: Cardiovascular;  Laterality: N/A;   CARDIOVERSION N/A 09/24/2019   Procedure: CARDIOVERSION;  Surgeon: GoMinna MerrittsMD;  Location: [facility];  Service: Cardiovascular;  Laterality: N/A;   CARDIOVERSION N/A 10/17/2019   Procedure: CARDIOVERSION;  Surgeon: Minna Merritts, MD;  Location: [facility];  Service: Cardiovascular;  Laterality: N/A;   CARDIOVERSION N/A 11/08/2021   Procedure: CARDIOVERSION;  Surgeon: Minna Merritts, MD;  Location: [facility] OR[facility];  Service: Cardiovascular;  Laterality: N/A;   CESAREAN SECTION     x 3   IR ANGIO EXTRACRAN SEL COM CAROTID INNOMINATE UNI L MOD SED  11/13/2021   IR CT HEAD LTD  11/13/2021   IR PERCUTANEOUS ART THROMBECTOMY/INFUSION INTRACRANIAL INC DIAG ANGIO  11/13/2021   LAPAROSCOPIC GASTRIC SLEEVE  RESECTION WITH HIATAL HERNIA REPAIR  08/17/2015   Procedure: LAPAROSCOPIC GASTRIC SLEEVE RESECTION WITH HIATAL HERNIA REPAIR;  Surgeon: Johnathan Hausen, MD;  Location: [facility];  Service: General;;   RADIOLOGY WITH ANESTHESIA N/A 11/12/2021   Procedure: IR WITH ANESTHESIA;  Surgeon: Luanne Bras, MD;  Location: [place];  Service: Radiology;  Laterality: N/A;   TEE WITHOUT CARDIOVERSION N/A 08/11/2020   Procedure: TRANSESOPHAGEAL ECHOCARDIOGRAM (TEE);  Surgeon: Constance Haw, MD;  Location: [place] [facility] LAB;  Service: Cardiovascular;  Laterality: N/A;   TEE WITHOUT CARDIOVERSION N/A 11/08/2021   Procedure: TRANSESOPHAGEAL ECHOCARDIOGRAM (TEE);  Surgeon: Minna Merritts, MD;  Location: [facility];  Service: Cardiovascular;  Laterality: N/A;   TUBAL LIGATION     x 2   UPPER GI ENDOSCOPY  08/17/2015   Procedure: UPPER GI ENDOSCOPY;  Surgeon: Johnathan Hausen, MD;  Location: [facility];  Service: General;;    Family History  Problem Relation Age of Onset   Hypertension Mother    Diabetes Mother    Heart failure Mother     Social History Social History   Tobacco Use   Smoking status: Every Day    Packs/day: 0.50    Years: 31.00    Total pack years: 15.50    Types: Cigarettes    Passive exposure: Current   Smokeless tobacco: Never   Tobacco comments:    5 cigarettes daily   Vaping Use   Vaping Use: Never used  Substance Use Topics   Alcohol use: Yes    Alcohol/week: 1.0 - 2.0 standard drink of alcohol    Types: 1 - 2 Standard drinks or equivalent per week    Comment: Occaional use - 1-2 mixed drinks prior to stroke since d/c nothing 11/30/21   Drug use: No    Current Outpatient Medications  Medication Sig Dispense Refill   acetaminophen (TYLENOL) 500 MG tablet Take 1,000 mg by mouth every 6 (six) hours as needed (pain/headaches.).     amiodarone (PACERONE) 200 MG tablet Take 1 tablet (200 mg total) by mouth daily. 90 tablet 1   atorvastatin (LIPITOR) 40 MG tablet Take 1  tablet (40 mg total) by mouth at bedtime. 90 tablet 2   Cholecalciferol (VITAMIN D3 PO) Take 1 tablet by mouth in the morning.     Cyanocobalamin (VITAMIN B-12 PO) Take 1 tablet by mouth in the morning.     ELIQUIS 5 MG TABS tablet TAKE 1 TABLET(5 MG) BY MOUTH TWICE DAILY 180 tablet 1   escitalopram (LEXAPRO) 5 MG tablet Take 10 mg by mouth daily.     midodrine (PROAMATINE) 5 MG tablet Take 3 tablets (15 mg total) by mouth 3 (three) times daily. (Patient taking differently: Take 10 mg by mouth in the morning and at bedtime. Takes 2 tablets BID) 270 tablet 2   NON FORMULARY Take 1 each by mouth daily. Vitamin D + B12 gummy     traZODone (DESYREL) 50 MG tablet Take 50 mg  by mouth at bedtime.     No current facility-administered medications for this visit.    Allergies  Allergen Reactions   Codeine    Hydrocodone    Codeine Hives        Hydrocodone Hives    Review of Systems  Respiratory:  Negative for shortness of breath.   Cardiovascular:  Positive for chest pain (Occasional sharp pains). Negative for leg swelling.  Neurological:  Positive for dizziness, speech difficulty (Mild, significantly improved), weakness (Holding objects) and headaches.  Hematological:  Bruises/bleeds easily.  All other systems reviewed and are negative.   BP 122/81 (BP Location: Right Arm, Patient Position: Sitting, Cuff Size: Normal)   Pulse (!) 55   Resp 20   Ht '5\' 4"'$  (1.626 m)   Wt 144 lb (65.3 kg)   SpO2 98%   BMI 24.72 kg/m  Physical Exam Vitals reviewed.  Constitutional:      General: She is not in acute distress.    Appearance: Normal appearance.  HENT:     Head: Normocephalic and atraumatic.  Eyes:     Extraocular Movements: Extraocular movements intact.     Pupils: Pupils are equal, round, and reactive to light.  Neck:     Vascular: No carotid bruit.  Cardiovascular:     Rate and Rhythm: Regular rhythm. Bradycardia present.     Heart sounds: No murmur heard.    No friction rub. No  gallop.  Pulmonary:     Effort: Pulmonary effort is normal. No respiratory distress.     Breath sounds: Normal breath sounds. No wheezing.  Abdominal:     General: There is no distension.     Palpations: Abdomen is soft.  Musculoskeletal:     Cervical back: Neck supple.  Skin:    General: Skin is warm and dry.  Neurological:     Mental Status: She is alert and oriented to person, place, and time.     Cranial Nerves: No cranial nerve deficit.     Motor: No weakness.     Comments: Hesitancy with speech     Diagnostic Tests: Cardiac/Coronary CTA   TECHNIQUE: The patient was scanned on a [employer].   PROTOCOL: A 110 kV retrospective scan was triggered in the descending thoracic aorta at 111 HU's. Axial non-contrast 3 mm slices were carried out through the heart. The data set was analyzed on a dedicated work station and scored using the [place]. Gantry rotation speed was 250 msecs and collimation was .6 mm. Beta blockade and 0.8 mg of sl NTG was given. The 3D data set was reconstructed in 10% intervals of the 0-90 % of the R-R cycle. Diastolic phases were analyzed on a dedicated work station using MPR, MIP and VRT modes. The patient received 149m OMNIPAQUE IOHEXOL 350 MG/ML SOLN of contrast.   FINDINGS: Quality: Good, HR 56   Coronary calcium score: The patient's coronary artery calcium score is 393, which places the patient in the 99th percentile.   Coronary arteries: Normal coronary origins. Right dominance. Evaluation was limited as nitroglycerin was not administered.   Right Coronary Artery: Dominant. Minimal mixed 1-24% proximal stenosis (CADRADS1).   Left Main Coronary Artery: Normal. Bifurcates into the LAD and LCx arteries.   Left Anterior Descending Coronary Artery: Minimal mixed proximal 1-24% stenosis (CADRADS1). The distal LAD is not visualized.   Left Circumflex Artery: Large AV groove vessel with minimal mixed 1-24% proximal stenosis  (CADRADS1). There is a large distal OM1 branch without disease.  Aorta: Normal size, 30 mm at the mid ascending aorta (level of the PA bifurcation) measured double oblique. No calcifications. No dissection.   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   Other findings:   Normal pulmonary vein drainage into the left atrium.   Normal left atrial appendage without a thrombus.   Significantly dilated main pulmonary artery to 40 mm, suggestive of pulmonary hypertension.   Severe proximal ventricular septal thickening c/w diagnosis of HCM.   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   IMPRESSION: 1. Minimal mixed non-obstructive CAD, CADRADS = 1 (findings limited due to lack of nitroglycerin administration).   2. Coronary calcium score of 393. This was 99th percentile for age and sex matched control.   3. Normal coronary origin with right dominance.   4. 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   5. Significantly dilated main pulmonary artery to 40 mm, suggestive of pulmonary hypertension.   6. Severe proximal ventricular septal thickening c/w diagnosis of HCM.   7. 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   Electronically Signed: By: Pixie Casino M.D. On: 01/27/2022 15:41   OVER-READ INTERPRETATION  CT CHEST   The following report is an over-read performed by radiologist Dr. [facility] on 01/27/2022. This over-read does not include interpretation of cardiac or coronary anatomy or pathology. The coronary CTA interpretation by the cardiologist is attached.   COMPARISON:  Chest CT 07/30/2019   FINDINGS: Normal caliber thoracic aorta.  No dissection.    Significant enlargement of the pulmonary arterial trunk measuring 4.2 cm. The right and left main pulmonary arteries are also prominent. Findings consistent with pulmonary hypertension.   No mediastinal or hilar mass or lymphadenopathy. The esophagus is grossly normal. There is a small hiatal hernia noted.   The lungs are clear of an acute process. No worrisome pulmonary lesions or pulmonary nodules.   No significant upper abdominal findings. Surgical changes gastric surgery.   No significant bony findings.   IMPRESSION: 1. Enlarged pulmonary artery suggesting pulmonary hypertension. 2. No mediastinal or hilar mass or adenopathy. 3. No significant pulmonary findings.     Electronically Signed   By: Marijo Sanes M.D.   On: 01/27/2022 15:57  Echocardiogram 11/13/2021 IMPRESSIONS     1. Left ventricular ejection fraction, by estimation, is 60 to 65%. The  left ventricle has normal function. The left ventricle has no regional  wall motion abnormalities. There is severe concentric left ventricular  hypertrophy. Indeterminate diastolic  filling due to E-A fusion. Elevated left ventricular end-diastolic  pressure.   2. Right ventricular systolic function is normal. The right ventricular  size is moderately enlarged.   3. Right atrial size was moderately dilated.   4. Left atrial size was severely dilated.   5. The mitral valve is normal in structure. Mild mitral valve  regurgitation. No evidence of mitral stenosis.   6. There is a soft tissue density on the noncoronary cusp extending into  the LVOT. 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 Differential dx includes  endocarditis (in right clinical setting) or valvular myxmoa. Images from  TEE done 11/08/21 demonstrate the   same soft tissue density (images 34-37 and 42 on TEE). There is  also a  prominent nodule of Ar[place] on the [facility] that is normal finding.   7. Recommend cMRI for further tissue evaluation of the aortic valve  mass.   I personally reviewed the CT and echocardiogram images.  Concur with findings of concentric left ventricular hypertrophy, aortic valve nodule, possible PFO, enlarged pulmonary artery, biatrial enlargement left greater than right.  Impression: Hannah Zimmerman is a 51 year old woman with a past history significant for stroke, tumor of the aortic valve, persistent atrial fibrillation, pulmonary hypertension, hypertrophic cardiomyopathy without obstruction, morbid obesity status post gastric bypass surgery, and tobacco abuse.  She had a stroke in February involving her right MCA.  Potential sources include atrial fibrillation with recent cardioversion and the tumor on her aortic valve.  It is possible that neither of these issues were involved.  However, I suspect it was  one or the other.  We discussed the possibility of surgical resection of the aortic valve tumor along with a Maze procedure, left atrial clipping, and closure of PFO if one is present.  I informed her of the general nature of the procedure including the incisions to be used, the use of cardiopulmonary bypass, the use of temporary pacemaker wires and drainage tubes postoperatively, the expected hospital stay, and the overall recovery.  I informed her of the indications, risks, benefits, and alternatives.  She understands the risks include, but not limited to death, MI, DVT, PE, bleeding, possible need for transfusion, infection, cardiac arrhythmias, complete heart block requiring pacemaker placement, as well as possibility of other unforeseeable complications such as renal respiratory failure or gastrointestinal complications.  She understands there is no guarantee that she would not have another stroke.  She wishes to proceed.     Plan: Median sternotomy for resection of aortic valve tumor, possible AVR, Maze procedure, left atrial appendage clip, and possible PFO closure Hold Eliquis 5 days prior to surgery.  Melrose Nakayama, MD [facility] [phone]

## 2022-02-28 ENCOUNTER — Other Ambulatory Visit: Payer: Self-pay | Admitting: *Deleted

## 2022-02-28 DIAGNOSIS — D151 Benign neoplasm of heart: Secondary | ICD-10-CM

## 2022-03-01 NOTE — Progress Notes (Signed)
Surgical Instructions    Your procedure is scheduled on Monday June 19th.  Report to [facility] Main Entrance "A" at 5:30 A.M., then check in with the Admitting office.  Call this number if you have problems the morning of surgery:  [phone]   If you have any questions prior to your surgery date call [phone]: Open Monday-Friday 8am-4pm    Remember:  Do not eat or drink after midnight the night before your surgery     Take these medicines the morning of surgery with A SIP OF WATER:  amiodarone (PACERONE) 200 MG tablet midodrine (PROAMATINE) 5 MG tablet  IF NEEDED: acetaminophen (TYLENOL) 500 MG tablet ALPRAZolam (XANAX) 0.25 MG tablet   Follow your surgeon's instructions on when to stop Eliquis.  If no instructions were given by your surgeon then you will need to call the office to get those instructions.     As of today, STOP taking any Aspirin (unless otherwise instructed by your surgeon) Aleve, Naproxen, Ibuprofen, Motrin, Advil, Goody's, BC's, all herbal medications, fish oil, and all vitamins.           Do not wear jewelry or makeup Do not wear lotions, powders, perfumes, or deodorant. Do not shave 48 hours prior to surgery.   Do not bring valuables to the hospital. Do not wear nail polish, gel polish, artificial nails, or any other type of covering on natural nails (fingers and toes) If you have artificial nails or gel coating that need to be removed by a nail salon, please have this removed prior to surgery. Artificial nails or gel coating may interfere with anesthesia's ability to adequately monitor your vital signs.  [facility] is not responsible for any belongings or valuables. .   Do NOT Smoke (Tobacco/Vaping)  24 hours prior to your procedure  If you use a CPAP at night, you may bring your mask for your overnight stay.   Contacts, glasses, hearing aids, dentures or partials may not be worn into surgery, please bring cases for these belongings   For  patients admitted to the hospital, discharge time will be determined by your treatment team.   Patients discharged the day of surgery will not be allowed to drive home, and someone needs to stay with them for 24 hours.   SURGICAL WAITING ROOM VISITATION Patients having surgery or a procedure in a hospital may have two support people. Children under the age of 66 must have an adult with them who is not the patient. They may stay in the waiting area during the procedure and may switch out with other visitors. If the patient needs to stay at the hospital during part of their recovery, the visitor guidelines for inpatient rooms apply.  Please refer to the [facility] website for the visitor guidelines for Inpatients (after your surgery is over and you are in a regular room).       Special instructions:    Oral Hygiene is also important to reduce your risk of infection.  Remember - BRUSH YOUR TEETH THE MORNING OF SURGERY WITH YOUR REGULAR TOOTHPASTE   [place]- Preparing For Surgery  Before surgery, you can play an important role. Because skin is not sterile, your skin needs to be as free of germs as possible. You can reduce the number of germs on your skin by washing with CHG (chlorahexidine gluconate) Soap before surgery.  CHG is an antiseptic cleaner which kills germs and bonds with the skin to continue killing germs even after washing.  Please do not use if you have an allergy to CHG or antibacterial soaps. If your skin becomes reddened/irritated stop using the CHG.  Do not shave (including legs and underarms) for at least 48 hours prior to first CHG shower. It is OK to shave your face.  Please follow these instructions carefully.     Shower the NIGHT BEFORE SURGERY and the MORNING OF SURGERY with CHG Soap.   If you chose to wash your hair, wash your hair first as usual with your normal shampoo. After you shampoo, rinse your hair and body thoroughly to remove the shampoo.  Then  [employer] and genitals (private parts) with your normal soap and rinse thoroughly to remove soap.  After that Use CHG Soap as you would any other liquid soap. You can apply CHG directly to the skin and wash gently with a scrungie or a clean washcloth.   Apply the CHG Soap to your body ONLY FROM THE NECK DOWN.  Do not use on open wounds or open sores. Avoid contact with your eyes, ears, mouth and genitals (private parts). Wash Face and genitals (private parts)  with your normal soap.   Wash thoroughly, paying special attention to the area where your surgery will be performed.  Thoroughly rinse your body with warm water from the neck down.  DO NOT shower/wash with your normal soap after using and rinsing off the CHG Soap.  Pat yourself dry with a CLEAN TOWEL.  Wear CLEAN PAJAMAS to bed the night before surgery  Place CLEAN SHEETS on your bed the night before your surgery  DO NOT SLEEP WITH PETS.   Day of Surgery:  Take a shower with CHG soap. Wear Clean/Comfortable clothing the morning of surgery Do not apply any deodorants/lotions.   Remember to brush your teeth WITH YOUR REGULAR TOOTHPASTE.    If you received a COVID test during your pre-op visit, it is requested that you wear a mask when out in public, stay away from anyone that may not be feeling well, and notify your surgeon if you develop symptoms. If you have been in contact with anyone that has tested positive in the last 10 days, please notify your surgeon.    Please read over the following fact sheets that you were given.

## 2022-03-02 ENCOUNTER — Ambulatory Visit (HOSPITAL_COMMUNITY)
Admission: RE | Admit: 2022-03-02 | Discharge: 2022-03-02 | Disposition: A | Discharge status: Home / Self Care | Payer: Commercial Managed Care - HMO | Source: Ambulatory Visit | Attending: Thoracic Surgery (Cardiothoracic Vascular Surgery) | Admitting: Thoracic Surgery (Cardiothoracic Vascular Surgery)

## 2022-03-02 ENCOUNTER — Other Ambulatory Visit: Payer: Self-pay

## 2022-03-02 ENCOUNTER — Encounter (HOSPITAL_COMMUNITY)
Admission: RE | Admit: 2022-03-02 | Discharge: 2022-03-02 | Disposition: A | Discharge status: Home / Self Care | Payer: Commercial Managed Care - HMO | Source: Ambulatory Visit | Attending: Thoracic Surgery (Cardiothoracic Vascular Surgery) | Admitting: Thoracic Surgery (Cardiothoracic Vascular Surgery)

## 2022-03-02 ENCOUNTER — Encounter (HOSPITAL_COMMUNITY): Payer: Self-pay

## 2022-03-02 VITALS — BP 112/79 | HR 57 | Temp 97.8°F | Resp 18 | Ht 64.0 in | Wt 147.3 lb

## 2022-03-02 DIAGNOSIS — I509 Heart failure, unspecified: Secondary | ICD-10-CM | POA: Insufficient documentation

## 2022-03-02 DIAGNOSIS — Z01810 Encounter for preprocedural cardiovascular examination: Secondary | ICD-10-CM | POA: Diagnosis not present

## 2022-03-02 DIAGNOSIS — Z01818 Encounter for other preprocedural examination: Secondary | ICD-10-CM | POA: Insufficient documentation

## 2022-03-02 DIAGNOSIS — I110 Hypertensive heart disease with heart failure: Secondary | ICD-10-CM | POA: Diagnosis not present

## 2022-03-02 DIAGNOSIS — Z8673 Personal history of transient ischemic attack (TIA), and cerebral infarction without residual deficits: Secondary | ICD-10-CM | POA: Diagnosis not present

## 2022-03-02 DIAGNOSIS — Z720 Tobacco use: Secondary | ICD-10-CM | POA: Diagnosis not present

## 2022-03-02 DIAGNOSIS — D151 Benign neoplasm of heart: Secondary | ICD-10-CM | POA: Diagnosis not present

## 2022-03-02 DIAGNOSIS — Z20822 Contact with and (suspected) exposure to covid-19: Secondary | ICD-10-CM | POA: Insufficient documentation

## 2022-03-02 HISTORY — DX: Personal history of other diseases of the digestive system: Z87.19

## 2022-03-02 LAB — TYPE AND SCREEN
ABO/RH(D): O NEG
Antibody Screen: NEGATIVE

## 2022-03-02 LAB — APTT: aPTT: 40 seconds — ABNORMAL HIGH (ref 24–36)

## 2022-03-02 LAB — PULMONARY FUNCTION TEST
DL/VA % pred: 73 %
DL/VA: 3.18 ml/min/mmHg/L
DLCO cor % pred: 72 %
DLCO cor: 15.29 ml/min/mmHg
DLCO unc % pred: 75 %
DLCO unc: 15.99 ml/min/mmHg
FEF 25-75 Post: 0.84 L/sec
FEF 25-75 Pre: 1.34 L/sec
FEF2575-%Change-Post: -37 %
FEF2575-%Pred-Post: 30 %
FEF2575-%Pred-Pre: 48 %
FEV1-%Change-Post: -16 %
FEV1-%Pred-Post: 59 %
FEV1-%Pred-Pre: 70 %
FEV1-Post: 1.66 L
FEV1-Pre: 1.98 L
FEV1FVC-%Change-Post: -10 %
FEV1FVC-%Pred-Pre: 87 %
FEV6-%Change-Post: -4 %
FEV6-%Pred-Post: 77 %
FEV6-%Pred-Pre: 80 %
FEV6-Post: 2.65 L
FEV6-Pre: 2.78 L
FEV6FVC-%Change-Post: 1 %
FEV6FVC-%Pred-Post: 102 %
FEV6FVC-%Pred-Pre: 100 %
FVC-%Change-Post: -6 %
FVC-%Pred-Post: 75 %
FVC-%Pred-Pre: 80 %
FVC-Post: 2.65 L
FVC-Pre: 2.84 L
Post FEV1/FVC ratio: 63 %
Post FEV6/FVC ratio: 100 %
Pre FEV1/FVC ratio: 70 %
Pre FEV6/FVC Ratio: 98 %
RV % pred: 163 %
RV: 2.92 L
TLC % pred: 112 %
TLC: 5.7 L

## 2022-03-02 LAB — BLOOD GAS, ARTERIAL
Acid-Base Excess: 0.8 mmol/L (ref 0.0–2.0)
Bicarbonate: 24.2 mmol/L (ref 20.0–28.0)
Drawn by: 587931
O2 Saturation: 99 %
Patient temperature: 37
pCO2 arterial: 34 mmHg (ref 32–48)
pH, Arterial: 7.46 — ABNORMAL HIGH (ref 7.35–7.45)
pO2, Arterial: 94 mmHg (ref 83–108)

## 2022-03-02 LAB — URINALYSIS, ROUTINE W REFLEX MICROSCOPIC
Bilirubin Urine: NEGATIVE
Glucose, UA: NEGATIVE mg/dL
Hgb urine dipstick: NEGATIVE
Ketones, ur: 5 mg/dL — AB
Leukocytes,Ua: NEGATIVE
Nitrite: NEGATIVE
Protein, ur: 30 mg/dL — AB
Specific Gravity, Urine: 1.029 (ref 1.005–1.030)
pH: 5 (ref 5.0–8.0)

## 2022-03-02 LAB — COMPREHENSIVE METABOLIC PANEL
ALT: 25 U/L (ref 0–44)
AST: 33 U/L (ref 15–41)
Albumin: 4 g/dL (ref 3.5–5.0)
Alkaline Phosphatase: 82 U/L (ref 38–126)
Anion gap: 13 (ref 5–15)
BUN: 9 mg/dL (ref 6–20)
CO2: 20 mmol/L — ABNORMAL LOW (ref 22–32)
Calcium: 9 mg/dL (ref 8.9–10.3)
Chloride: 108 mmol/L (ref 98–111)
Creatinine, Ser: 0.77 mg/dL (ref 0.44–1.00)
GFR, Estimated: >60 mL/min (ref >60)
Glucose, Bld: 83 mg/dL (ref 70–99)
Potassium: 3.3 mmol/L — ABNORMAL LOW (ref 3.5–5.1)
Sodium: 141 mmol/L (ref 135–145)
Total Bilirubin: 0.8 mg/dL (ref 0.3–1.2)
Total Protein: 6.5 g/dL (ref 6.5–8.1)

## 2022-03-02 LAB — SARS CORONAVIRUS 2 (TAT 6-24 HRS): SARS Coronavirus 2: NEGATIVE

## 2022-03-02 LAB — SURGICAL PCR SCREEN
MRSA, PCR: NEGATIVE
Staphylococcus aureus: NEGATIVE

## 2022-03-02 LAB — HEMOGLOBIN A1C
Hgb A1c MFr Bld: 5.2 % (ref 4.8–5.6)
Mean Plasma Glucose: 102.54 mg/dL

## 2022-03-02 LAB — CBC
HCT: 43.1 % (ref 36.0–46.0)
Hemoglobin: 15 g/dL (ref 12.0–15.0)
MCH: 34.9 pg — ABNORMAL HIGH (ref 26.0–34.0)
MCHC: 34.8 g/dL (ref 30.0–36.0)
MCV: 100.2 fL — ABNORMAL HIGH (ref 80.0–100.0)
Platelets: 288 10*3/uL (ref 150–400)
RBC: 4.3 MIL/uL (ref 3.87–5.11)
RDW: 15 % (ref 11.5–15.5)
WBC: 7.7 10*3/uL (ref 4.0–10.5)
nRBC: 0 % (ref 0.0–0.2)

## 2022-03-02 LAB — PROTIME-INR
INR: 1.5 — ABNORMAL HIGH (ref 0.8–1.2)
Prothrombin Time: 18.2 seconds — ABNORMAL HIGH (ref 11.4–15.2)

## 2022-03-02 MED ORDER — ALBUTEROL SULFATE (2.5 MG/3ML) 0.083% IN NEBU
2.5000 mg | INHALATION_SOLUTION | Freq: Once | RESPIRATORY_TRACT | Status: AC
  Administered 2022-03-02: 2.5 mg via RESPIRATORY_TRACT

## 2022-03-02 NOTE — Progress Notes (Signed)
Pre-CABG doppler study completed. Please see CV Proc for preliminary results.  Anderson Malta  Ferdinando Lodge BS, RVT 03/02/2022 2:44 PM

## 2022-03-02 NOTE — Progress Notes (Signed)
PCP - Grayland Ormond PA Cardiologist - Dr Curt Bears  PPM/ICD - Denies Device Orders -  Rep Notified -   Chest x-ray - 03/02/22 EKG - 03/02/22 Stress Test - Denies ECHO - 11/13/21 Cardiac Cath - Denies  Sleep Study - Denies  DM - Denies  Blood Thinner Instructions:Eliquis Per patient instructed to take last dose on 03/01/22 Aspirin Instructions:Per patient instructed to continue aspirin until day before surgery.   COVID TEST- 03/02/22   Anesthesia review: Yes cardiac history  Patient denies shortness of breath, fever, cough and chest pain at PAT appointment   All instructions explained to the patient, with a verbal understanding of the material. Patient agrees to go over the instructions while at home for a better understanding. Patient also instructed to wear a mask while in public after being tested for COVID-19. The opportunity to ask questions was provided.

## 2022-03-03 ENCOUNTER — Other Ambulatory Visit: Payer: Self-pay

## 2022-03-03 ENCOUNTER — Emergency Department
Admission: EM | Admit: 2022-03-03 | Discharge: 2022-03-03 | Discharge status: Against Medical Advice | Payer: Commercial Managed Care - HMO | Attending: Emergency Medicine | Admitting: Emergency Medicine

## 2022-03-03 DIAGNOSIS — H538 Other visual disturbances: Secondary | ICD-10-CM | POA: Insufficient documentation

## 2022-03-03 DIAGNOSIS — R519 Headache, unspecified: Secondary | ICD-10-CM | POA: Insufficient documentation

## 2022-03-03 DIAGNOSIS — Z5321 Procedure and treatment not carried out due to patient leaving prior to being seen by health care provider: Secondary | ICD-10-CM | POA: Insufficient documentation

## 2022-03-03 DIAGNOSIS — R55 Syncope and collapse: Secondary | ICD-10-CM | POA: Insufficient documentation

## 2022-03-03 LAB — COMPREHENSIVE METABOLIC PANEL
ALT: 26 U/L (ref 0–44)
AST: 36 U/L (ref 15–41)
Albumin: 4.3 g/dL (ref 3.5–5.0)
Alkaline Phosphatase: 85 U/L (ref 38–126)
Anion gap: 9 (ref 5–15)
BUN: 12 mg/dL (ref 6–20)
CO2: 27 mmol/L (ref 22–32)
Calcium: 9.3 mg/dL (ref 8.9–10.3)
Chloride: 107 mmol/L (ref 98–111)
Creatinine, Ser: 0.86 mg/dL (ref 0.44–1.00)
GFR, Estimated: >60 mL/min (ref >60)
Glucose, Bld: 89 mg/dL (ref 70–99)
Potassium: 3.1 mmol/L — ABNORMAL LOW (ref 3.5–5.1)
Sodium: 143 mmol/L (ref 135–145)
Total Bilirubin: 1 mg/dL (ref 0.3–1.2)
Total Protein: 6.9 g/dL (ref 6.5–8.1)

## 2022-03-03 LAB — CBC
HCT: 41.5 % (ref 36.0–46.0)
Hemoglobin: 14 g/dL (ref 12.0–15.0)
MCH: 33.3 pg (ref 26.0–34.0)
MCHC: 33.7 g/dL (ref 30.0–36.0)
MCV: 98.8 fL (ref 80.0–100.0)
Platelets: 284 10*3/uL (ref 150–400)
RBC: 4.2 MIL/uL (ref 3.87–5.11)
RDW: 15.1 % (ref 11.5–15.5)
WBC: 7.9 10*3/uL (ref 4.0–10.5)
nRBC: 0 % (ref 0.0–0.2)

## 2022-03-03 MED ORDER — TRANEXAMIC ACID (OHS) BOLUS VIA INFUSION
15.0000 mg/kg | INTRAVENOUS | Status: AC
  Filled 2022-03-03: qty 1002

## 2022-03-03 MED ORDER — INSULIN REGULAR(HUMAN) IN NACL 100-0.9 UT/100ML-% IV SOLN
INTRAVENOUS | Status: AC
  Filled 2022-03-03: qty 100

## 2022-03-03 MED ORDER — POTASSIUM CHLORIDE 2 MEQ/ML IV SOLN
80.0000 meq | INTRAVENOUS | Status: AC
  Filled 2022-03-03: qty 40

## 2022-03-03 MED ORDER — MILRINONE LACTATE IN DEXTROSE 20-5 MG/100ML-% IV SOLN
0.3000 ug/kg/min | INTRAVENOUS | Status: AC
  Filled 2022-03-03: qty 100

## 2022-03-03 MED ORDER — TRANEXAMIC ACID (OHS) PUMP PRIME SOLUTION
2.0000 mg/kg | INTRAVENOUS | Status: AC
  Filled 2022-03-03: qty 1.34

## 2022-03-03 MED ORDER — TRANEXAMIC ACID 1000 MG/10ML IV SOLN
1.5000 mg/kg/h | INTRAVENOUS | Status: AC
  Filled 2022-03-03: qty 25

## 2022-03-03 MED ORDER — CEFAZOLIN SODIUM-DEXTROSE 2-4 GM/100ML-% IV SOLN
2.0000 g | INTRAVENOUS | Status: AC
  Filled 2022-03-03 (×2): qty 100

## 2022-03-03 MED ORDER — EPINEPHRINE HCL 5 MG/250ML IV SOLN IN NS
0.0000 ug/min | INTRAVENOUS | Status: AC
  Filled 2022-03-03: qty 250

## 2022-03-03 MED ORDER — DEXMEDETOMIDINE HCL IN NACL 400 MCG/100ML IV SOLN
0.1000 ug/kg/h | INTRAVENOUS | Status: AC
  Filled 2022-03-03: qty 100

## 2022-03-03 MED ORDER — CEFAZOLIN SODIUM-DEXTROSE 2-4 GM/100ML-% IV SOLN
2.0000 g | INTRAVENOUS | Status: AC
  Filled 2022-03-03: qty 100

## 2022-03-03 MED ORDER — VANCOMYCIN HCL 1250 MG/250ML IV SOLN
1250.0000 mg | INTRAVENOUS | Status: AC
  Filled 2022-03-03: qty 250

## 2022-03-03 MED ORDER — PHENYLEPHRINE HCL-NACL 20-0.9 MG/250ML-% IV SOLN
30.0000 ug/min | INTRAVENOUS | Status: AC
  Filled 2022-03-03: qty 250

## 2022-03-03 MED ORDER — PLASMA-LYTE A IV SOLN
INTRAVENOUS | Status: AC
  Filled 2022-03-03: qty 2.5

## 2022-03-03 MED ORDER — NITROGLYCERIN IN D5W 200-5 MCG/ML-% IV SOLN
2.0000 ug/min | INTRAVENOUS | Status: AC
  Filled 2022-03-03: qty 250

## 2022-03-03 MED ORDER — MAGNESIUM SULFATE 50 % IJ SOLN
40.0000 meq | INTRAMUSCULAR | Status: AC
  Filled 2022-03-03: qty 9.85

## 2022-03-03 MED ORDER — HEPARIN 30,000 UNITS/1000 ML (OHS) CELLSAVER SOLUTION
Status: AC
  Filled 2022-03-03: qty 1000

## 2022-03-03 MED ORDER — NOREPINEPHRINE 4 MG/250ML-% IV SOLN
0.0000 ug/min | INTRAVENOUS | Status: AC
  Filled 2022-03-03: qty 250

## 2022-03-03 NOTE — ED Triage Notes (Signed)
Pt states that she had a syncopal episode about 30 minutes ago- pt had a stroke in Feb- pt states right before she passed out she was had blurry vision and a headache but denies both now- pt states she had gotten really hot before she had her syncopal episode

## 2022-03-03 NOTE — ED Provider Notes (Signed)
Triage nurse has been calling the patient in the waiting room with no answer.  I called the patient on her cell phone to let her know that we have a room available but she reports she left because she was feeling better   Hannah Drafts, MD 03/03/22 2020

## 2022-03-05 MED ORDER — NOREPINEPHRINE 4 MG/250ML-% IV SOLN
0.0000 ug/min | INTRAVENOUS | Status: DC
  Filled 2022-03-05: qty 250

## 2022-03-05 MED ORDER — PLASMA-LYTE A IV SOLN
INTRAVENOUS | Status: DC
  Filled 2022-03-05: qty 2.5

## 2022-03-05 MED ORDER — TRANEXAMIC ACID 1000 MG/10ML IV SOLN
1.5000 mg/kg/h | INTRAVENOUS | Status: AC
  Administered 2022-03-06: 1.5 mg/kg/h via INTRAVENOUS
  Filled 2022-03-05: qty 25

## 2022-03-05 MED ORDER — MILRINONE LACTATE IN DEXTROSE 20-5 MG/100ML-% IV SOLN
0.3000 ug/kg/min | INTRAVENOUS | Status: AC
  Administered 2022-03-06: .375 ug/kg/min via INTRAVENOUS
  Filled 2022-03-05: qty 100

## 2022-03-05 MED ORDER — VANCOMYCIN HCL 1250 MG/250ML IV SOLN
1250.0000 mg | INTRAVENOUS | Status: AC
  Administered 2022-03-06: 1250 mg via INTRAVENOUS
  Filled 2022-03-05: qty 250

## 2022-03-05 MED ORDER — TRANEXAMIC ACID (OHS) PUMP PRIME SOLUTION
2.0000 mg/kg | INTRAVENOUS | Status: DC
  Filled 2022-03-05: qty 1.31

## 2022-03-05 MED ORDER — POTASSIUM CHLORIDE 2 MEQ/ML IV SOLN
80.0000 meq | INTRAVENOUS | Status: DC
  Filled 2022-03-05: qty 40

## 2022-03-05 MED ORDER — DEXMEDETOMIDINE HCL IN NACL 400 MCG/100ML IV SOLN
0.1000 ug/kg/h | INTRAVENOUS | Status: AC
  Administered 2022-03-06: .7 ug/kg/h via INTRAVENOUS
  Filled 2022-03-05: qty 100

## 2022-03-05 MED ORDER — MANNITOL 20 % IV SOLN
INTRAVENOUS | Status: DC
  Filled 2022-03-05: qty 13

## 2022-03-05 MED ORDER — HEPARIN 30,000 UNITS/1000 ML (OHS) CELLSAVER SOLUTION
Status: DC
  Filled 2022-03-05: qty 1000

## 2022-03-05 MED ORDER — CEFAZOLIN SODIUM-DEXTROSE 2-4 GM/100ML-% IV SOLN
2.0000 g | INTRAVENOUS | Status: AC
  Administered 2022-03-06 (×2): 2 g via INTRAVENOUS
  Filled 2022-03-05: qty 100

## 2022-03-05 MED ORDER — TRANEXAMIC ACID (OHS) BOLUS VIA INFUSION
15.0000 mg/kg | INTRAVENOUS | Status: AC
  Administered 2022-03-06: 979.5 mg via INTRAVENOUS
  Filled 2022-03-05: qty 980

## 2022-03-05 MED ORDER — NITROGLYCERIN IN D5W 200-5 MCG/ML-% IV SOLN
2.0000 ug/min | INTRAVENOUS | Status: AC
  Administered 2022-03-06: 5 ug/min via INTRAVENOUS
  Filled 2022-03-05: qty 250

## 2022-03-05 MED ORDER — PHENYLEPHRINE HCL-NACL 20-0.9 MG/250ML-% IV SOLN
30.0000 ug/min | INTRAVENOUS | Status: AC
  Administered 2022-03-06: 10 ug/min via INTRAVENOUS
  Filled 2022-03-05: qty 250

## 2022-03-05 MED ORDER — CEFAZOLIN SODIUM-DEXTROSE 2-4 GM/100ML-% IV SOLN
2.0000 g | INTRAVENOUS | Status: DC
  Filled 2022-03-05: qty 100

## 2022-03-05 MED ORDER — EPINEPHRINE HCL 5 MG/250ML IV SOLN IN NS
0.0000 ug/min | INTRAVENOUS | Status: DC
  Filled 2022-03-05: qty 250

## 2022-03-05 MED ORDER — INSULIN REGULAR(HUMAN) IN NACL 100-0.9 UT/100ML-% IV SOLN
INTRAVENOUS | Status: AC
  Administered 2022-03-06: 1 [IU]/h via INTRAVENOUS
  Filled 2022-03-05: qty 100

## 2022-03-06 ENCOUNTER — Inpatient Hospital Stay (HOSPITAL_COMMUNITY): Payer: Commercial Managed Care - HMO | Admitting: Certified Registered"

## 2022-03-06 ENCOUNTER — Inpatient Hospital Stay (HOSPITAL_COMMUNITY)
Admission: RE | Admit: 2022-03-06 | Discharge: 2022-03-14 | DRG: 229 | Disposition: A | Discharge status: Home / Self Care | Payer: Commercial Managed Care - HMO | Attending: Thoracic Surgery (Cardiothoracic Vascular Surgery) | Admitting: Thoracic Surgery (Cardiothoracic Vascular Surgery)

## 2022-03-06 ENCOUNTER — Encounter (HOSPITAL_COMMUNITY): Payer: Self-pay | Admitting: Thoracic Surgery (Cardiothoracic Vascular Surgery)

## 2022-03-06 ENCOUNTER — Other Ambulatory Visit: Payer: Self-pay

## 2022-03-06 ENCOUNTER — Inpatient Hospital Stay (HOSPITAL_COMMUNITY): Payer: Commercial Managed Care - HMO

## 2022-03-06 ENCOUNTER — Encounter (HOSPITAL_COMMUNITY)
Admission: RE | Disposition: A | Discharge status: Home / Self Care | Payer: Self-pay | Source: Home / Self Care | Attending: Thoracic Surgery (Cardiothoracic Vascular Surgery)

## 2022-03-06 DIAGNOSIS — R112 Nausea with vomiting, unspecified: Secondary | ICD-10-CM | POA: Diagnosis not present

## 2022-03-06 DIAGNOSIS — Z9884 Bariatric surgery status: Secondary | ICD-10-CM

## 2022-03-06 DIAGNOSIS — I253 Aneurysm of heart: Secondary | ICD-10-CM | POA: Diagnosis present

## 2022-03-06 DIAGNOSIS — Z9889 Other specified postprocedural states: Principal | ICD-10-CM

## 2022-03-06 DIAGNOSIS — D151 Benign neoplasm of heart: Secondary | ICD-10-CM

## 2022-03-06 DIAGNOSIS — I2510 Atherosclerotic heart disease of native coronary artery without angina pectoris: Secondary | ICD-10-CM

## 2022-03-06 DIAGNOSIS — Z79899 Other long term (current) drug therapy: Secondary | ICD-10-CM

## 2022-03-06 DIAGNOSIS — D62 Acute posthemorrhagic anemia: Secondary | ICD-10-CM | POA: Diagnosis not present

## 2022-03-06 DIAGNOSIS — N289 Disorder of kidney and ureter, unspecified: Secondary | ICD-10-CM | POA: Diagnosis not present

## 2022-03-06 DIAGNOSIS — G8194 Hemiplegia, unspecified affecting left nondominant side: Secondary | ICD-10-CM | POA: Diagnosis present

## 2022-03-06 DIAGNOSIS — I495 Sick sinus syndrome: Secondary | ICD-10-CM | POA: Diagnosis not present

## 2022-03-06 DIAGNOSIS — J9382 Other air leak: Secondary | ICD-10-CM | POA: Diagnosis not present

## 2022-03-06 DIAGNOSIS — Z7901 Long term (current) use of anticoagulants: Secondary | ICD-10-CM

## 2022-03-06 DIAGNOSIS — I422 Other hypertrophic cardiomyopathy: Secondary | ICD-10-CM | POA: Diagnosis present

## 2022-03-06 DIAGNOSIS — I272 Pulmonary hypertension, unspecified: Secondary | ICD-10-CM | POA: Diagnosis present

## 2022-03-06 DIAGNOSIS — I483 Typical atrial flutter: Secondary | ICD-10-CM | POA: Diagnosis present

## 2022-03-06 DIAGNOSIS — I5031 Acute diastolic (congestive) heart failure: Secondary | ICD-10-CM | POA: Diagnosis not present

## 2022-03-06 DIAGNOSIS — Z885 Allergy status to narcotic agent status: Secondary | ICD-10-CM | POA: Diagnosis not present

## 2022-03-06 DIAGNOSIS — Z8249 Family history of ischemic heart disease and other diseases of the circulatory system: Secondary | ICD-10-CM | POA: Diagnosis not present

## 2022-03-06 DIAGNOSIS — I5032 Chronic diastolic (congestive) heart failure: Secondary | ICD-10-CM | POA: Diagnosis present

## 2022-03-06 DIAGNOSIS — J90 Pleural effusion, not elsewhere classified: Secondary | ICD-10-CM | POA: Diagnosis present

## 2022-03-06 DIAGNOSIS — F17210 Nicotine dependence, cigarettes, uncomplicated: Secondary | ICD-10-CM | POA: Diagnosis present

## 2022-03-06 DIAGNOSIS — Z8679 Personal history of other diseases of the circulatory system: Secondary | ICD-10-CM | POA: Diagnosis not present

## 2022-03-06 DIAGNOSIS — I4891 Unspecified atrial fibrillation: Secondary | ICD-10-CM

## 2022-03-06 DIAGNOSIS — I4819 Other persistent atrial fibrillation: Secondary | ICD-10-CM | POA: Diagnosis present

## 2022-03-06 DIAGNOSIS — R001 Bradycardia, unspecified: Secondary | ICD-10-CM | POA: Diagnosis not present

## 2022-03-06 DIAGNOSIS — I69398 Other sequelae of cerebral infarction: Secondary | ICD-10-CM

## 2022-03-06 HISTORY — PX: CLIPPING OF ATRIAL APPENDAGE: SHX5773

## 2022-03-06 HISTORY — PX: EXCISION OF ATRIAL MYXOMA: SHX5821

## 2022-03-06 HISTORY — PX: MAZE: SHX5063

## 2022-03-06 HISTORY — PX: TEE WITHOUT CARDIOVERSION: SHX5443

## 2022-03-06 LAB — POCT I-STAT EG7
Acid-Base Excess: 0 mmol/L (ref 0.0–2.0)
Bicarbonate: 25.3 mmol/L (ref 20.0–28.0)
Calcium, Ion: 0.99 mmol/L — ABNORMAL LOW (ref 1.15–1.40)
HCT: 29 % — ABNORMAL LOW (ref 36.0–46.0)
Hemoglobin: 9.9 g/dL — ABNORMAL LOW (ref 12.0–15.0)
O2 Saturation: 80 %
Potassium: 3.2 mmol/L — ABNORMAL LOW (ref 3.5–5.1)
Sodium: 140 mmol/L (ref 135–145)
TCO2: 27 mmol/L (ref 22–32)
pCO2, Ven: 41.3 mmHg — ABNORMAL LOW (ref 44–60)
pH, Ven: 7.395 (ref 7.25–7.43)
pO2, Ven: 44 mmHg (ref 32–45)

## 2022-03-06 LAB — POCT I-STAT, CHEM 8
BUN: 11 mg/dL (ref 6–20)
BUN: 9 mg/dL (ref 6–20)
BUN: 9 mg/dL (ref 6–20)
BUN: 7 mg/dL (ref 6–20)
Calcium, Ion: 1.12 mmol/L — ABNORMAL LOW (ref 1.15–1.40)
Calcium, Ion: 1.15 mmol/L (ref 1.15–1.40)
Calcium, Ion: 0.96 mmol/L — ABNORMAL LOW (ref 1.15–1.40)
Calcium, Ion: 0.95 mmol/L — ABNORMAL LOW (ref 1.15–1.40)
Chloride: 105 mmol/L (ref 98–111)
Chloride: 104 mmol/L (ref 98–111)
Chloride: 102 mmol/L (ref 98–111)
Chloride: 103 mmol/L (ref 98–111)
Creatinine, Ser: 0.6 mg/dL (ref 0.44–1.00)
Creatinine, Ser: 0.5 mg/dL (ref 0.44–1.00)
Creatinine, Ser: 0.4 mg/dL — ABNORMAL LOW (ref 0.44–1.00)
Creatinine, Ser: 0.4 mg/dL — ABNORMAL LOW (ref 0.44–1.00)
Glucose, Bld: 73 mg/dL (ref 70–99)
Glucose, Bld: 88 mg/dL (ref 70–99)
Glucose, Bld: 93 mg/dL (ref 70–99)
Glucose, Bld: 124 mg/dL — ABNORMAL HIGH (ref 70–99)
HCT: 40 % (ref 36.0–46.0)
HCT: 37 % (ref 36.0–46.0)
HCT: 30 % — ABNORMAL LOW (ref 36.0–46.0)
HCT: 28 % — ABNORMAL LOW (ref 36.0–46.0)
Hemoglobin: 13.6 g/dL (ref 12.0–15.0)
Hemoglobin: 12.6 g/dL (ref 12.0–15.0)
Hemoglobin: 10.2 g/dL — ABNORMAL LOW (ref 12.0–15.0)
Hemoglobin: 9.5 g/dL — ABNORMAL LOW (ref 12.0–15.0)
Potassium: 3.4 mmol/L — ABNORMAL LOW (ref 3.5–5.1)
Potassium: 3.3 mmol/L — ABNORMAL LOW (ref 3.5–5.1)
Potassium: 3.7 mmol/L (ref 3.5–5.1)
Potassium: 3.6 mmol/L (ref 3.5–5.1)
Sodium: 142 mmol/L (ref 135–145)
Sodium: 140 mmol/L (ref 135–145)
Sodium: 139 mmol/L (ref 135–145)
Sodium: 141 mmol/L (ref 135–145)
TCO2: 25 mmol/L (ref 22–32)
TCO2: 23 mmol/L (ref 22–32)
TCO2: 25 mmol/L (ref 22–32)
TCO2: 22 mmol/L (ref 22–32)

## 2022-03-06 LAB — POCT I-STAT 7, (LYTES, BLD GAS, ICA,H+H)
Acid-Base Excess: 0 mmol/L (ref 0.0–2.0)
Acid-Base Excess: 0 mmol/L (ref 0.0–2.0)
Acid-Base Excess: 2 mmol/L (ref 0.0–2.0)
Acid-base deficit: 1 mmol/L (ref 0.0–2.0)
Acid-base deficit: 2 mmol/L (ref 0.0–2.0)
Acid-base deficit: 5 mmol/L — ABNORMAL HIGH (ref 0.0–2.0)
Acid-base deficit: 5 mmol/L — ABNORMAL HIGH (ref 0.0–2.0)
Bicarbonate: 25.6 mmol/L (ref 20.0–28.0)
Bicarbonate: 24.4 mmol/L (ref 20.0–28.0)
Bicarbonate: 22.5 mmol/L (ref 20.0–28.0)
Bicarbonate: 26.1 mmol/L (ref 20.0–28.0)
Bicarbonate: 23.8 mmol/L (ref 20.0–28.0)
Bicarbonate: 20.5 mmol/L (ref 20.0–28.0)
Bicarbonate: 20.5 mmol/L (ref 20.0–28.0)
Calcium, Ion: 1.21 mmol/L (ref 1.15–1.40)
Calcium, Ion: 0.85 mmol/L — CL (ref 1.15–1.40)
Calcium, Ion: 0.94 mmol/L — ABNORMAL LOW (ref 1.15–1.40)
Calcium, Ion: 0.96 mmol/L — ABNORMAL LOW (ref 1.15–1.40)
Calcium, Ion: 1.06 mmol/L — ABNORMAL LOW (ref 1.15–1.40)
Calcium, Ion: 1.18 mmol/L (ref 1.15–1.40)
Calcium, Ion: 1.17 mmol/L (ref 1.15–1.40)
HCT: 41 % (ref 36.0–46.0)
HCT: 26 % — ABNORMAL LOW (ref 36.0–46.0)
HCT: 28 % — ABNORMAL LOW (ref 36.0–46.0)
HCT: 30 % — ABNORMAL LOW (ref 36.0–46.0)
HCT: 32 % — ABNORMAL LOW (ref 36.0–46.0)
HCT: 27 % — ABNORMAL LOW (ref 36.0–46.0)
HCT: 26 % — ABNORMAL LOW (ref 36.0–46.0)
Hemoglobin: 13.9 g/dL (ref 12.0–15.0)
Hemoglobin: 8.8 g/dL — ABNORMAL LOW (ref 12.0–15.0)
Hemoglobin: 10.2 g/dL — ABNORMAL LOW (ref 12.0–15.0)
Hemoglobin: 9.5 g/dL — ABNORMAL LOW (ref 12.0–15.0)
Hemoglobin: 10.9 g/dL — ABNORMAL LOW (ref 12.0–15.0)
Hemoglobin: 9.2 g/dL — ABNORMAL LOW (ref 12.0–15.0)
Hemoglobin: 8.8 g/dL — ABNORMAL LOW (ref 12.0–15.0)
O2 Saturation: 100 %
O2 Saturation: 100 %
O2 Saturation: 100 %
O2 Saturation: 100 %
O2 Saturation: 98 %
O2 Saturation: 98 %
O2 Saturation: 99 %
Patient temperature: 35.4
Patient temperature: 36.7
Patient temperature: 36.3
Potassium: 3.4 mmol/L — ABNORMAL LOW (ref 3.5–5.1)
Potassium: 3.6 mmol/L (ref 3.5–5.1)
Potassium: 3.6 mmol/L (ref 3.5–5.1)
Potassium: 3.9 mmol/L (ref 3.5–5.1)
Potassium: 3.2 mmol/L — ABNORMAL LOW (ref 3.5–5.1)
Potassium: 3.9 mmol/L (ref 3.5–5.1)
Potassium: 3.5 mmol/L (ref 3.5–5.1)
Sodium: 141 mmol/L (ref 135–145)
Sodium: 136 mmol/L (ref 135–145)
Sodium: 139 mmol/L (ref 135–145)
Sodium: 141 mmol/L (ref 135–145)
Sodium: 141 mmol/L (ref 135–145)
Sodium: 142 mmol/L (ref 135–145)
Sodium: 142 mmol/L (ref 135–145)
TCO2: 27 mmol/L (ref 22–32)
TCO2: 26 mmol/L (ref 22–32)
TCO2: 24 mmol/L (ref 22–32)
TCO2: 27 mmol/L (ref 22–32)
TCO2: 25 mmol/L (ref 22–32)
TCO2: 22 mmol/L (ref 22–32)
TCO2: 22 mmol/L (ref 22–32)
pCO2 arterial: 42.8 mmHg (ref 32–48)
pCO2 arterial: 37.7 mmHg (ref 32–48)
pCO2 arterial: 33.8 mmHg (ref 32–48)
pCO2 arterial: 37.3 mmHg (ref 32–48)
pCO2 arterial: 40.6 mmHg (ref 32–48)
pCO2 arterial: 37.9 mmHg (ref 32–48)
pCO2 arterial: 40.1 mmHg (ref 32–48)
pH, Arterial: 7.385 (ref 7.35–7.45)
pH, Arterial: 7.419 (ref 7.35–7.45)
pH, Arterial: 7.431 (ref 7.35–7.45)
pH, Arterial: 7.452 — ABNORMAL HIGH (ref 7.35–7.45)
pH, Arterial: 7.368 (ref 7.35–7.45)
pH, Arterial: 7.34 — ABNORMAL LOW (ref 7.35–7.45)
pH, Arterial: 7.314 — ABNORMAL LOW (ref 7.35–7.45)
pO2, Arterial: 272 mmHg — ABNORMAL HIGH (ref 83–108)
pO2, Arterial: 390 mmHg — ABNORMAL HIGH (ref 83–108)
pO2, Arterial: 203 mmHg — ABNORMAL HIGH (ref 83–108)
pO2, Arterial: 260 mmHg — ABNORMAL HIGH (ref 83–108)
pO2, Arterial: 96 mmHg (ref 83–108)
pO2, Arterial: 110 mmHg — ABNORMAL HIGH (ref 83–108)
pO2, Arterial: 139 mmHg — ABNORMAL HIGH (ref 83–108)

## 2022-03-06 LAB — CBC
HCT: 31.9 % — ABNORMAL LOW (ref 36.0–46.0)
HCT: 27.8 % — ABNORMAL LOW (ref 36.0–46.0)
Hemoglobin: 10.7 g/dL — ABNORMAL LOW (ref 12.0–15.0)
Hemoglobin: 9.3 g/dL — ABNORMAL LOW (ref 12.0–15.0)
MCH: 34.2 pg — ABNORMAL HIGH (ref 26.0–34.0)
MCH: 34.4 pg — ABNORMAL HIGH (ref 26.0–34.0)
MCHC: 33.5 g/dL (ref 30.0–36.0)
MCHC: 33.5 g/dL (ref 30.0–36.0)
MCV: 101.9 fL — ABNORMAL HIGH (ref 80.0–100.0)
MCV: 103 fL — ABNORMAL HIGH (ref 80.0–100.0)
Platelets: 134 10*3/uL — ABNORMAL LOW (ref 150–400)
Platelets: 142 10*3/uL — ABNORMAL LOW (ref 150–400)
RBC: 3.13 MIL/uL — ABNORMAL LOW (ref 3.87–5.11)
RBC: 2.7 MIL/uL — ABNORMAL LOW (ref 3.87–5.11)
RDW: 15.3 % (ref 11.5–15.5)
RDW: 15.4 % (ref 11.5–15.5)
WBC: 14 10*3/uL — ABNORMAL HIGH (ref 4.0–10.5)
WBC: 18 10*3/uL — ABNORMAL HIGH (ref 4.0–10.5)
nRBC: 0 % (ref 0.0–0.2)
nRBC: 0 % (ref 0.0–0.2)

## 2022-03-06 LAB — BASIC METABOLIC PANEL
Anion gap: 8 (ref 5–15)
Anion gap: 9 (ref 5–15)
BUN: 8 mg/dL (ref 6–20)
BUN: 9 mg/dL (ref 6–20)
CO2: 21 mmol/L — ABNORMAL LOW (ref 22–32)
CO2: 21 mmol/L — ABNORMAL LOW (ref 22–32)
Calcium: 8 mg/dL — ABNORMAL LOW (ref 8.9–10.3)
Calcium: 8.2 mg/dL — ABNORMAL LOW (ref 8.9–10.3)
Chloride: 111 mmol/L (ref 98–111)
Chloride: 110 mmol/L (ref 98–111)
Creatinine, Ser: 0.74 mg/dL (ref 0.44–1.00)
Creatinine, Ser: 0.72 mg/dL (ref 0.44–1.00)
GFR, Estimated: >60 mL/min (ref >60)
GFR, Estimated: >60 mL/min (ref >60)
Glucose, Bld: 134 mg/dL — ABNORMAL HIGH (ref 70–99)
Glucose, Bld: 147 mg/dL — ABNORMAL HIGH (ref 70–99)
Potassium: 4 mmol/L (ref 3.5–5.1)
Potassium: 3.9 mmol/L (ref 3.5–5.1)
Sodium: 140 mmol/L (ref 135–145)
Sodium: 140 mmol/L (ref 135–145)

## 2022-03-06 LAB — GLUCOSE, CAPILLARY
Glucose-Capillary: 125 mg/dL — ABNORMAL HIGH (ref 70–99)
Glucose-Capillary: 73 mg/dL (ref 70–99)
Glucose-Capillary: 135 mg/dL — ABNORMAL HIGH (ref 70–99)
Glucose-Capillary: 139 mg/dL — ABNORMAL HIGH (ref 70–99)
Glucose-Capillary: 135 mg/dL — ABNORMAL HIGH (ref 70–99)
Glucose-Capillary: 168 mg/dL — ABNORMAL HIGH (ref 70–99)
Glucose-Capillary: 141 mg/dL — ABNORMAL HIGH (ref 70–99)
Glucose-Capillary: 165 mg/dL — ABNORMAL HIGH (ref 70–99)
Glucose-Capillary: 145 mg/dL — ABNORMAL HIGH (ref 70–99)

## 2022-03-06 LAB — HEMOGLOBIN AND HEMATOCRIT, BLOOD
HCT: 28.1 % — ABNORMAL LOW (ref 36.0–46.0)
Hemoglobin: 9.8 g/dL — ABNORMAL LOW (ref 12.0–15.0)

## 2022-03-06 LAB — PROTIME-INR
INR: 1.5 — ABNORMAL HIGH (ref 0.8–1.2)
Prothrombin Time: 18.3 seconds — ABNORMAL HIGH (ref 11.4–15.2)

## 2022-03-06 LAB — APTT: aPTT: 36 seconds (ref 24–36)

## 2022-03-06 LAB — MAGNESIUM: Magnesium: 3.2 mg/dL — ABNORMAL HIGH (ref 1.7–2.4)

## 2022-03-06 LAB — ABO/RH: ABO/RH(D): O NEG

## 2022-03-06 LAB — PLATELET COUNT: Platelets: 139 10*3/uL — ABNORMAL LOW (ref 150–400)

## 2022-03-06 SURGERY — EXCISION, MYXOMA, CARDIAC ATRIUM
Anesthesia: General

## 2022-03-06 MED ORDER — METOPROLOL TARTRATE 12.5 MG HALF TABLET
12.5000 mg | ORAL_TABLET | Freq: Once | ORAL | Status: DC
  Filled 2022-03-06: qty 1

## 2022-03-06 MED ORDER — ACETAMINOPHEN 650 MG RE SUPP
650.0000 mg | Freq: Once | RECTAL | Status: AC
Start: 2022-03-06 — End: 2022-03-06

## 2022-03-06 MED ORDER — CHLORHEXIDINE GLUCONATE 0.12 % MT SOLN
15.0000 mL | OROMUCOSAL | Status: AC
  Administered 2022-03-06: 15 mL via OROMUCOSAL
  Filled 2022-03-06: qty 15

## 2022-03-06 MED ORDER — POTASSIUM CHLORIDE 10 MEQ/50ML IV SOLN
10.0000 meq | Freq: Once | INTRAVENOUS | Status: AC
  Administered 2022-03-06: 10 meq via INTRAVENOUS
  Filled 2022-03-06: qty 50

## 2022-03-06 MED ORDER — MAGNESIUM SULFATE 4 GM/100ML IV SOLN
4.0000 g | Freq: Once | INTRAVENOUS | Status: AC
  Administered 2022-03-06: 4 g via INTRAVENOUS
  Filled 2022-03-06: qty 100

## 2022-03-06 MED ORDER — SODIUM CHLORIDE (PF) 0.9 % IJ SOLN
OROMUCOSAL | Status: DC | PRN
  Administered 2022-03-06 (×3): 4 mL via TOPICAL

## 2022-03-06 MED ORDER — ASPIRIN 325 MG PO TBEC: 325.0000 mg | DELAYED_RELEASE_TABLET | Freq: Every day | ORAL | Status: DC

## 2022-03-06 MED ORDER — ~~LOC~~ CARDIAC SURGERY, PATIENT & FAMILY EDUCATION
Freq: Once | Status: DC
  Filled 2022-03-06: qty 1

## 2022-03-06 MED ORDER — ACETAMINOPHEN 160 MG/5ML PO SOLN: 1000.0000 mg | Freq: Four times a day (QID) | ORAL | Status: AC

## 2022-03-06 MED ORDER — PROTAMINE SULFATE 10 MG/ML IV SOLN
INTRAVENOUS | Status: AC
  Filled 2022-03-06: qty 25

## 2022-03-06 MED ORDER — ACETAMINOPHEN 500 MG PO TABS
1000.0000 mg | ORAL_TABLET | Freq: Four times a day (QID) | ORAL | Status: AC
  Administered 2022-03-07 – 2022-03-11 (×13): 1000 mg via ORAL
  Filled 2022-03-06 (×17): qty 2

## 2022-03-06 MED ORDER — TRAMADOL HCL 50 MG PO TABS
50.0000 mg | ORAL_TABLET | ORAL | Status: DC | PRN
  Administered 2022-03-07 – 2022-03-08 (×4): 100 mg via ORAL
  Administered 2022-03-12 – 2022-03-14 (×6): 50 mg via ORAL
  Filled 2022-03-06 (×2): qty 1
  Filled 2022-03-06 (×3): qty 2
  Filled 2022-03-06 (×2): qty 1
  Filled 2022-03-06: qty 2
  Filled 2022-03-06 (×2): qty 1

## 2022-03-06 MED ORDER — LACTATED RINGERS IV SOLN: 500.0000 mL | Freq: Once | INTRAVENOUS | Status: DC | PRN

## 2022-03-06 MED ORDER — HEMOSTATIC AGENTS (NO CHARGE) OPTIME
TOPICAL | Status: DC | PRN
  Administered 2022-03-06 (×2): 1 via TOPICAL

## 2022-03-06 MED ORDER — LACTATED RINGERS IV SOLN: INTRAVENOUS | Status: DC | PRN

## 2022-03-06 MED ORDER — PHENYLEPHRINE 80 MCG/ML (10ML) SYRINGE FOR IV PUSH (FOR BLOOD PRESSURE SUPPORT)
PREFILLED_SYRINGE | INTRAVENOUS | Status: AC
  Filled 2022-03-06: qty 10

## 2022-03-06 MED ORDER — SODIUM CHLORIDE 0.9% FLUSH: 3.0000 mL | INTRAVENOUS | Status: DC | PRN

## 2022-03-06 MED ORDER — PHENYLEPHRINE HCL-NACL 20-0.9 MG/250ML-% IV SOLN
0.0000 ug/min | INTRAVENOUS | Status: DC
  Administered 2022-03-06: 130 ug/min via INTRAVENOUS
  Filled 2022-03-06: qty 250

## 2022-03-06 MED ORDER — PLASMA-LYTE A IV SOLN
INTRAVENOUS | Status: DC | PRN
  Administered 2022-03-06: 500 mL via INTRAVASCULAR

## 2022-03-06 MED ORDER — DOCUSATE SODIUM 100 MG PO CAPS
200.0000 mg | ORAL_CAPSULE | Freq: Every day | ORAL | Status: DC
  Administered 2022-03-07 – 2022-03-12 (×6): 200 mg via ORAL
  Filled 2022-03-06 (×8): qty 2

## 2022-03-06 MED ORDER — ESCITALOPRAM OXALATE 10 MG PO TABS
10.0000 mg | ORAL_TABLET | Freq: Every day | ORAL | Status: DC
  Administered 2022-03-07 – 2022-03-14 (×8): 10 mg via ORAL
  Filled 2022-03-06 (×8): qty 1

## 2022-03-06 MED ORDER — POTASSIUM CHLORIDE 10 MEQ/50ML IV SOLN
10.0000 meq | Freq: Once | INTRAVENOUS | Status: DC
Start: 2022-03-06 — End: 2022-03-06

## 2022-03-06 MED ORDER — MIDAZOLAM HCL (PF) 5 MG/ML IJ SOLN
INTRAMUSCULAR | Status: DC | PRN
  Administered 2022-03-06 (×2): 1 mg via INTRAVENOUS
  Administered 2022-03-06 (×2): 2 mg via INTRAVENOUS
  Administered 2022-03-06: 1 mg via INTRAVENOUS

## 2022-03-06 MED ORDER — CALCIUM CHLORIDE 10 % IV SOLN
1.0000 g | Freq: Once | INTRAVENOUS | Status: AC
Start: 2022-03-06 — End: 2022-03-06
  Administered 2022-03-06: 1 g via INTRAVENOUS

## 2022-03-06 MED ORDER — ROCURONIUM BROMIDE 10 MG/ML (PF) SYRINGE
PREFILLED_SYRINGE | INTRAVENOUS | Status: DC | PRN
  Administered 2022-03-06: 10 mg via INTRAVENOUS
  Administered 2022-03-06: 70 mg via INTRAVENOUS
  Administered 2022-03-06: 50 mg via INTRAVENOUS
  Administered 2022-03-06: 20 mg via INTRAVENOUS

## 2022-03-06 MED ORDER — SODIUM CHLORIDE 0.9 % IV SOLN: INTRAVENOUS | Status: DC

## 2022-03-06 MED ORDER — HEPARIN SODIUM (PORCINE) 1000 UNIT/ML IJ SOLN
INTRAMUSCULAR | Status: AC
  Filled 2022-03-06: qty 1

## 2022-03-06 MED ORDER — OXYCODONE HCL 5 MG PO TABS
5.0000 mg | ORAL_TABLET | ORAL | Status: DC | PRN
  Administered 2022-03-07 – 2022-03-13 (×4): 10 mg via ORAL
  Filled 2022-03-06 (×4): qty 2

## 2022-03-06 MED ORDER — SODIUM CHLORIDE 0.9 % IV SOLN: INTRAVENOUS | Status: DC | PRN

## 2022-03-06 MED ORDER — MIDAZOLAM HCL (PF) 10 MG/2ML IJ SOLN
INTRAMUSCULAR | Status: AC
  Filled 2022-03-06: qty 2

## 2022-03-06 MED ORDER — LACTATED RINGERS IV SOLN: INTRAVENOUS | Status: DC

## 2022-03-06 MED ORDER — MILRINONE LACTATE IN DEXTROSE 20-5 MG/100ML-% IV SOLN: 0.3000 ug/kg/min | INTRAVENOUS | Status: DC

## 2022-03-06 MED ORDER — PROPOFOL 10 MG/ML IV BOLUS
INTRAVENOUS | Status: DC | PRN
  Administered 2022-03-06: 80 mg via INTRAVENOUS

## 2022-03-06 MED ORDER — FENTANYL CITRATE (PF) 250 MCG/5ML IJ SOLN
INTRAMUSCULAR | Status: AC
  Filled 2022-03-06: qty 5

## 2022-03-06 MED ORDER — CEFAZOLIN SODIUM-DEXTROSE 2-4 GM/100ML-% IV SOLN
2.0000 g | Freq: Three times a day (TID) | INTRAVENOUS | Status: AC
  Administered 2022-03-06 – 2022-03-08 (×6): 2 g via INTRAVENOUS
  Filled 2022-03-06 (×6): qty 100

## 2022-03-06 MED ORDER — METOPROLOL TARTRATE 25 MG/10 ML ORAL SUSPENSION: 12.5000 mg | Freq: Two times a day (BID) | ORAL | Status: DC

## 2022-03-06 MED ORDER — PHENYLEPHRINE 80 MCG/ML (10ML) SYRINGE FOR IV PUSH (FOR BLOOD PRESSURE SUPPORT)
PREFILLED_SYRINGE | INTRAVENOUS | Status: DC | PRN
  Administered 2022-03-06: 80 ug via INTRAVENOUS

## 2022-03-06 MED ORDER — ONDANSETRON HCL 4 MG/2ML IJ SOLN
4.0000 mg | Freq: Four times a day (QID) | INTRAMUSCULAR | Status: DC | PRN
  Administered 2022-03-07 – 2022-03-13 (×5): 4 mg via INTRAVENOUS
  Filled 2022-03-06 (×6): qty 2

## 2022-03-06 MED ORDER — FENTANYL CITRATE PF 50 MCG/ML IJ SOSY
50.0000 ug | PREFILLED_SYRINGE | INTRAMUSCULAR | Status: DC | PRN
  Administered 2022-03-06 – 2022-03-07 (×7): 50 ug via INTRAVENOUS
  Filled 2022-03-06 (×7): qty 1

## 2022-03-06 MED ORDER — PANTOPRAZOLE SODIUM 40 MG PO TBEC
40.0000 mg | DELAYED_RELEASE_TABLET | Freq: Every day | ORAL | Status: DC
  Administered 2022-03-08 – 2022-03-14 (×7): 40 mg via ORAL
  Filled 2022-03-06 (×7): qty 1

## 2022-03-06 MED ORDER — ALBUMIN HUMAN 5 % IV SOLN
12.5000 g | Freq: Once | INTRAVENOUS | Status: AC
Start: 2022-03-06 — End: 2022-03-06
  Administered 2022-03-06: 12.5 g via INTRAVENOUS
  Filled 2022-03-06: qty 250

## 2022-03-06 MED ORDER — INSULIN REGULAR(HUMAN) IN NACL 100-0.9 UT/100ML-% IV SOLN: INTRAVENOUS | Status: DC

## 2022-03-06 MED ORDER — FAMOTIDINE IN NACL 20-0.9 MG/50ML-% IV SOLN
20.0000 mg | Freq: Two times a day (BID) | INTRAVENOUS | Status: AC
  Administered 2022-03-06: 20 mg via INTRAVENOUS
  Filled 2022-03-06: qty 50

## 2022-03-06 MED ORDER — BISACODYL 5 MG PO TBEC
10.0000 mg | DELAYED_RELEASE_TABLET | Freq: Every day | ORAL | Status: DC
  Administered 2022-03-07 – 2022-03-11 (×3): 10 mg via ORAL
  Filled 2022-03-06 (×6): qty 2

## 2022-03-06 MED ORDER — SODIUM CHLORIDE 0.45 % IV SOLN: INTRAVENOUS | Status: DC | PRN

## 2022-03-06 MED ORDER — ROCURONIUM BROMIDE 10 MG/ML (PF) SYRINGE
PREFILLED_SYRINGE | INTRAVENOUS | Status: AC
  Filled 2022-03-06: qty 10

## 2022-03-06 MED ORDER — ALPRAZOLAM 0.25 MG PO TABS
0.1250 mg | ORAL_TABLET | Freq: Two times a day (BID) | ORAL | Status: DC | PRN
  Administered 2022-03-12 – 2022-03-13 (×2): 0.125 mg via ORAL
  Filled 2022-03-06 (×2): qty 1

## 2022-03-06 MED ORDER — ALBUMIN HUMAN 5 % IV SOLN: INTRAVENOUS | Status: DC | PRN

## 2022-03-06 MED ORDER — ASPIRIN 81 MG PO CHEW: 324.0000 mg | CHEWABLE_TABLET | Freq: Every day | ORAL | Status: DC

## 2022-03-06 MED ORDER — PROPOFOL 10 MG/ML IV BOLUS
INTRAVENOUS | Status: AC
Start: 2022-03-06 — End: ?
  Filled 2022-03-06: qty 20

## 2022-03-06 MED ORDER — ATORVASTATIN CALCIUM 40 MG PO TABS
40.0000 mg | ORAL_TABLET | Freq: Every day | ORAL | Status: DC
  Administered 2022-03-07 – 2022-03-13 (×7): 40 mg via ORAL
  Filled 2022-03-06 (×7): qty 1

## 2022-03-06 MED ORDER — DEXMEDETOMIDINE HCL IN NACL 400 MCG/100ML IV SOLN
0.0000 ug/kg/h | INTRAVENOUS | Status: DC
  Filled 2022-03-06: qty 100

## 2022-03-06 MED ORDER — AMIODARONE HCL 200 MG PO TABS
200.0000 mg | ORAL_TABLET | Freq: Every day | ORAL | Status: DC
  Administered 2022-03-07 – 2022-03-09 (×3): 200 mg via ORAL
  Filled 2022-03-06 (×3): qty 1

## 2022-03-06 MED ORDER — 0.9 % SODIUM CHLORIDE (POUR BTL) OPTIME
TOPICAL | Status: DC | PRN
  Administered 2022-03-06: 5000 mL

## 2022-03-06 MED ORDER — BISACODYL 10 MG RE SUPP: 10.0000 mg | Freq: Every day | RECTAL | Status: DC

## 2022-03-06 MED ORDER — MIDAZOLAM HCL 2 MG/2ML IJ SOLN
2.0000 mg | INTRAMUSCULAR | Status: DC | PRN
  Administered 2022-03-06: 2 mg via INTRAVENOUS
  Filled 2022-03-06: qty 2

## 2022-03-06 MED ORDER — VANCOMYCIN HCL IN DEXTROSE 1-5 GM/200ML-% IV SOLN
1000.0000 mg | Freq: Once | INTRAVENOUS | Status: AC
  Administered 2022-03-06: 1000 mg via INTRAVENOUS
  Filled 2022-03-06: qty 200

## 2022-03-06 MED ORDER — ALBUMIN HUMAN 5 % IV SOLN
250.0000 mL | INTRAVENOUS | Status: AC | PRN
  Administered 2022-03-06 (×4): 12.5 g via INTRAVENOUS
  Filled 2022-03-06: qty 250

## 2022-03-06 MED ORDER — POTASSIUM CHLORIDE 10 MEQ/50ML IV SOLN
10.0000 meq | INTRAVENOUS | Status: AC
  Administered 2022-03-06 (×3): 10 meq via INTRAVENOUS

## 2022-03-06 MED ORDER — ACETAMINOPHEN 160 MG/5ML PO SOLN
650.0000 mg | Freq: Once | ORAL | Status: AC
Start: 2022-03-06 — End: 2022-03-06
  Administered 2022-03-06: 650 mg

## 2022-03-06 MED ORDER — DEXTROSE 50 % IV SOLN: 0.0000 mL | INTRAVENOUS | Status: DC | PRN

## 2022-03-06 MED ORDER — NITROGLYCERIN IN D5W 200-5 MCG/ML-% IV SOLN: 0.0000 ug/min | INTRAVENOUS | Status: DC

## 2022-03-06 MED ORDER — SODIUM CHLORIDE 0.9 % IV SOLN: 250.0000 mL | INTRAVENOUS | Status: DC

## 2022-03-06 MED ORDER — METOPROLOL TARTRATE 5 MG/5ML IV SOLN: 2.5000 mg | INTRAVENOUS | Status: DC | PRN

## 2022-03-06 MED ORDER — PROTAMINE SULFATE 10 MG/ML IV SOLN
INTRAVENOUS | Status: DC | PRN
  Administered 2022-03-06: 230 mg via INTRAVENOUS

## 2022-03-06 MED ORDER — CHLORHEXIDINE GLUCONATE CLOTH 2 % EX PADS
6.0000 | MEDICATED_PAD | Freq: Every day | CUTANEOUS | Status: DC
  Administered 2022-03-06 – 2022-03-13 (×10): 6 via TOPICAL

## 2022-03-06 MED ORDER — MILRINONE LACTATE IN DEXTROSE 20-5 MG/100ML-% IV SOLN
0.1250 ug/kg/min | INTRAVENOUS | Status: DC
  Administered 2022-03-07: 0.125 ug/kg/min via INTRAVENOUS
  Filled 2022-03-06: qty 100

## 2022-03-06 MED ORDER — POTASSIUM CHLORIDE 10 MEQ/50ML IV SOLN
10.0000 meq | INTRAVENOUS | Status: AC
  Administered 2022-03-06: 10 meq via INTRAVENOUS
  Filled 2022-03-06: qty 50

## 2022-03-06 MED ORDER — SODIUM CHLORIDE 0.9% FLUSH
3.0000 mL | Freq: Two times a day (BID) | INTRAVENOUS | Status: DC
  Administered 2022-03-07 – 2022-03-13 (×13): 3 mL via INTRAVENOUS

## 2022-03-06 MED ORDER — POTASSIUM CHLORIDE 10 MEQ/50ML IV SOLN: 10.0000 meq | INTRAVENOUS | Status: DC

## 2022-03-06 MED ORDER — CHLORHEXIDINE GLUCONATE 0.12 % MT SOLN
15.0000 mL | Freq: Once | OROMUCOSAL | Status: AC
  Administered 2022-03-06: 15 mL via OROMUCOSAL
  Filled 2022-03-06: qty 15

## 2022-03-06 MED ORDER — HEPARIN SODIUM (PORCINE) 1000 UNIT/ML IJ SOLN
INTRAMUSCULAR | Status: DC | PRN
  Administered 2022-03-06: 23000 [IU] via INTRAVENOUS

## 2022-03-06 MED ORDER — NOREPINEPHRINE 4 MG/250ML-% IV SOLN
0.0000 ug/min | INTRAVENOUS | Status: DC
  Administered 2022-03-06: 5 ug/min via INTRAVENOUS
  Administered 2022-03-06: 10 ug/min via INTRAVENOUS
  Administered 2022-03-07: 9 ug/min via INTRAVENOUS
  Administered 2022-03-07: 10 ug/min via INTRAVENOUS
  Filled 2022-03-06 (×3): qty 250

## 2022-03-06 MED ORDER — CHLORHEXIDINE GLUCONATE 4 % EX LIQD: 30.0000 mL | CUTANEOUS | Status: DC

## 2022-03-06 MED ORDER — METOPROLOL TARTRATE 12.5 MG HALF TABLET: 12.5000 mg | ORAL_TABLET | Freq: Two times a day (BID) | ORAL | Status: DC

## 2022-03-06 MED ORDER — PHENYLEPHRINE HCL-NACL 20-0.9 MG/250ML-% IV SOLN: 0.0000 ug/min | INTRAVENOUS | Status: DC

## 2022-03-06 MED ORDER — FENTANYL CITRATE (PF) 250 MCG/5ML IJ SOLN
INTRAMUSCULAR | Status: DC | PRN
  Administered 2022-03-06: 100 ug via INTRAVENOUS
  Administered 2022-03-06: 50 ug via INTRAVENOUS
  Administered 2022-03-06 (×2): 100 ug via INTRAVENOUS
  Administered 2022-03-06: 50 ug via INTRAVENOUS
  Administered 2022-03-06: 150 ug via INTRAVENOUS
  Administered 2022-03-06: 100 ug via INTRAVENOUS

## 2022-03-06 SURGICAL SUPPLY — 88 items
ADAPTER CARDIO PERF ANTE/RETRO (ADAPTER) ×3 IMPLANT
APPLICATOR COTTON TIP 6 STRL (MISCELLANEOUS) IMPLANT
APPLICATOR COTTON TIP 6IN STRL (MISCELLANEOUS)
ATRICLIP EXCLUSION VLAA 45 (Miscellaneous) ×3 IMPLANT
BAG DECANTER FOR FLEXI CONT (MISCELLANEOUS) ×3 IMPLANT
BLADE CLIPPER SURG (BLADE) ×3 IMPLANT
BLADE STERNUM SYSTEM 6 (BLADE) ×3 IMPLANT
BLADE SURG 15 STRL LF DISP TIS (BLADE) ×2 IMPLANT
BLADE SURG 15 STRL SS (BLADE) ×1
CANISTER SUCT 3000ML PPV (MISCELLANEOUS) ×3 IMPLANT
CANN PRFSN 3/8XCNCT ST RT ANG (MISCELLANEOUS) ×2
CANNULA EZ GLIDE AORTIC 21FR (CANNULA) ×3 IMPLANT
CANNULA GUNDRY RCSP 15FR (MISCELLANEOUS) ×3 IMPLANT
CANNULA PRFSN 3/8XCNCT RT ANG (MISCELLANEOUS) ×1 IMPLANT
CANNULA SUMP PERICARDIAL (CANNULA) ×2 IMPLANT
CANNULA VEN MTL TIP RT (MISCELLANEOUS) ×1
CANNULA VRC MALB SNGL STG 36FR (MISCELLANEOUS) ×1 IMPLANT
CATH CPB KIT HENDRICKSON (MISCELLANEOUS) ×3 IMPLANT
CATH HEART VENT LEFT (CATHETERS) ×2 IMPLANT
CATH ROBINSON RED A/P 18FR (CATHETERS) ×8 IMPLANT
CATH THORACIC 36FR RT ANG (CATHETERS) ×6 IMPLANT
CLAMP ISOLATOR SYNERGY LG (MISCELLANEOUS) ×2 IMPLANT
CONTAINER PROTECT SURGISLUSH (MISCELLANEOUS) ×6 IMPLANT
DEVICE EXCLUSIN ATRCLP VLAA 45 (Miscellaneous) ×1 IMPLANT
DRAPE CARDIOVASCULAR INCISE (DRAPES) ×1
DRAPE SRG 135X102X78XABS (DRAPES) ×2 IMPLANT
DRAPE WARM FLUID 44X44 (DRAPES) ×3 IMPLANT
DRSG COVADERM 4X14 (GAUZE/BANDAGES/DRESSINGS) ×3 IMPLANT
ELECT REM PT RETURN 9FT ADLT (ELECTROSURGICAL) ×6
ELECTRODE REM PT RTRN 9FT ADLT (ELECTROSURGICAL) ×4 IMPLANT
FELT TEFLON 1X6 (MISCELLANEOUS) ×6 IMPLANT
GAUZE 4X4 16PLY ~~LOC~~+RFID DBL (SPONGE) ×3 IMPLANT
GAUZE SPONGE 4X4 12PLY STRL (GAUZE/BANDAGES/DRESSINGS) ×4 IMPLANT
GLOVE BIO SURGEON STRL SZ 6.5 (GLOVE) ×2 IMPLANT
GLOVE SURG SIGNA 7.5 PF LTX (GLOVE) ×9 IMPLANT
GOWN STRL REUS W/ TWL LRG LVL3 (GOWN DISPOSABLE) ×10 IMPLANT
GOWN STRL REUS W/ TWL XL LVL3 (GOWN DISPOSABLE) ×4 IMPLANT
GOWN STRL REUS W/TWL LRG LVL3 (GOWN DISPOSABLE) ×6
GOWN STRL REUS W/TWL XL LVL3 (GOWN DISPOSABLE) ×3
HEMOSTAT POWDER SURGIFOAM 1G (HEMOSTASIS) ×9 IMPLANT
HEMOSTAT SURGICEL 2X14 (HEMOSTASIS) ×3 IMPLANT
KIT BASIN OR (CUSTOM PROCEDURE TRAY) ×3 IMPLANT
KIT SUCTION CATH 14FR (SUCTIONS) ×6 IMPLANT
KIT TURNOVER KIT B (KITS) ×3 IMPLANT
LINE VENT (MISCELLANEOUS) ×2 IMPLANT
NS IRRIG 1000ML POUR BTL (IV SOLUTION) ×15 IMPLANT
PACK E OPEN HEART (SUTURE) ×3 IMPLANT
PACK OPEN HEART (CUSTOM PROCEDURE TRAY) ×3 IMPLANT
PAD ARMBOARD 7.5X6 YLW CONV (MISCELLANEOUS) ×6 IMPLANT
POSITIONER HEAD DONUT 9IN (MISCELLANEOUS) ×3 IMPLANT
PROBE CRYO2-ABLATION MALLABLE (MISCELLANEOUS) ×2 IMPLANT
SET MPS 3-ND DEL (MISCELLANEOUS) ×2 IMPLANT
SPONGE T-LAP 18X18 ~~LOC~~+RFID (SPONGE) ×12 IMPLANT
SPONGE T-LAP 4X18 ~~LOC~~+RFID (SPONGE) ×3 IMPLANT
SUT BONE WAX W31G (SUTURE) ×3 IMPLANT
SUT ETHIBON EXCEL 2-0 V-5 (SUTURE) IMPLANT
SUT ETHIBOND 2 0 SH (SUTURE) ×3
SUT ETHIBOND 2 0 SH 36X2 (SUTURE) ×4 IMPLANT
SUT ETHIBOND 2 0 V4 (SUTURE) IMPLANT
SUT ETHIBOND 2 0V4 GREEN (SUTURE) IMPLANT
SUT ETHIBOND V-5 VALVE (SUTURE) IMPLANT
SUT PROLENE 3 0 SH 1 (SUTURE) IMPLANT
SUT PROLENE 3 0 SH DA (SUTURE) ×3 IMPLANT
SUT PROLENE 4 0 RB 1 (SUTURE) ×10
SUT PROLENE 4 0 SH DA (SUTURE) ×10 IMPLANT
SUT PROLENE 4-0 RB1 .5 CRCL 36 (SUTURE) ×12 IMPLANT
SUT PROLENE 5 0 C 1 36 (SUTURE) ×4 IMPLANT
SUT SILK  1 MH (SUTURE) ×1
SUT SILK 1 MH (SUTURE) ×2 IMPLANT
SUT STEEL 6MS V (SUTURE) ×2 IMPLANT
SUT STEEL SZ 6 DBL 3X14 BALL (SUTURE) ×2 IMPLANT
SUT VIC AB 1 CTX 27 (SUTURE) ×2 IMPLANT
SUT VIC AB 1 CTX 36 (SUTURE) ×2
SUT VIC AB 1 CTX36XBRD ANBCTR (SUTURE) ×4 IMPLANT
SUT VIC AB 2-0 CTX 27 (SUTURE) IMPLANT
SUT VIC AB 3-0 X1 27 (SUTURE) IMPLANT
SYR 10ML KIT SKIN ADHESIVE (MISCELLANEOUS) IMPLANT
SYSTEM SAHARA CHEST DRAIN ATS (WOUND CARE) ×3 IMPLANT
TAPE CLOTH SURG 4X10 WHT LF (GAUZE/BANDAGES/DRESSINGS) ×2 IMPLANT
TAPE PAPER 2X10 WHT MICROPORE (GAUZE/BANDAGES/DRESSINGS) ×2 IMPLANT
TOWEL GREEN STERILE (TOWEL DISPOSABLE) ×3 IMPLANT
TOWEL GREEN STERILE FF (TOWEL DISPOSABLE) ×3 IMPLANT
TRAY FOLEY SLVR 16FR TEMP STAT (SET/KITS/TRAYS/PACK) ×3 IMPLANT
TUBE SUCTION CARDIAC 10FR (CANNULA) ×2 IMPLANT
UNDERPAD 30X36 HEAVY ABSORB (UNDERPADS AND DIAPERS) ×3 IMPLANT
VENT LEFT HEART 12002 (CATHETERS) ×3
VRC MALLEABLE SINGLE STG 36FR (MISCELLANEOUS) ×3
WATER STERILE IRR 1000ML POUR (IV SOLUTION) ×6 IMPLANT

## 2022-03-06 NOTE — Interval H&P Note (Signed)
History and Physical Interval Note:  03/06/2022 7:17 AM  Hannah Zimmerman  has presented today for surgery, with the diagnosis of PAPILLARY FIBROELASTOMA  AORTIC VALVE AFIB POSSIBLE PFO.  The various methods of treatment have been discussed with the patient and family. After consideration of risks, benefits and other options for treatment, the patient has consented to  Procedure(s): RESECTION OF AORTIC VALVE TUMOR (N/A) possible AORTIC VALVE REPLACEMENT (AVR) (N/A) MAZE (N/A) CLIPPING OF ATRIAL APPENDAGE (N/A) REPAIR OF PATENT FORAMEN OVALE (N/A) TRANSESOPHAGEAL ECHOCARDIOGRAM (TEE) (N/A) as a surgical intervention.  The patient's history has been reviewed, patient examined, no change in status, stable for surgery.  I have reviewed the patient's chart and labs.  Questions were answered to the patient's satisfaction.     Melrose Nakayama

## 2022-03-06 NOTE — Progress Notes (Signed)
Dr Roxan Hockey notified of continued labile BP despite Albumin and titrating neo upward. See chart for orders received.

## 2022-03-06 NOTE — Op Note (Signed)
NAME: Hannah Zimmerman, Hannah Zimmerman MEDICAL RECORD NO: [id number] ACCOUNT NO: [id number] DATE OF BIRTH: September 19, 1970 FACILITY: [facility] LOCATION: [facility] PHYSICIAN: Salvatore Decent. Dorris Fetch, MD  Operative Report   DATE OF PROCEDURE: 03/06/2022  PREOPERATIVE DIAGNOSES:  Aortic valve tumor, atrial fibrillation, possible patent foramen ovale.  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  CLINICAL NOTE:  Mrs. Odam is a 51 year old woman who has a history of a stroke, tumor of the aortic valve with persistent atrial fibrillation, pulmonary hypertension, hypertrophic cardiomyopathy and tobacco abuse.  She had a stroke back in February.   This was several days after she had a cardioversion for atrial fibrillation.  She had an endovascular thrombectomy.  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  She was advised to  undergo surgical resection of the tumor.  Given her recurrent atrial fibrillation was also offered a maze procedure and left atrial appendage clipping at the time of the procedure.  The indications, risks, benefits, and alternatives were discussed in  detail with the patient.  She accepted the risks and agreed to proceed.  OPERATIVE NOTE:  Mrs. Fron was brought to the preoperative holding area on 03/06/2022.  Anesthesia placed an arterial blood pressure monitoring line and a Swan-Ganz catheter.  She was taken to the operating room and anesthetized and intubated.  Foley  catheter was placed.  Intravenous antibiotics were administered.  The chest, abdomen and legs were prepped and draped in the usual sterile fashion.  Dr. Val Eagle of Anesthesia performed transesophageal echocardiography.  Please see his separately  dictated note for full details of the procedure.  A timeout was performed.  Median sternotomy was performed.  Initial  hemostasis was obtained.  Sternal retractor was placed and was gradually opened over time.  The patient was fully heparinized.  The pericardium was opened.  Of note, the right atrium was  relatively enlarged.  There was generalized cardiomegaly.  The pulmonary artery was enlarged.  The aorta was normal caliber with no evidence of atherosclerotic disease.  After confirming anticoagulation with ACT measurement, the aorta was cannulated via  concentric 2-0 Ethibond pledgeted pursestring sutures.  A 28-French metal-tipped venous cannula was placed into the superior vena cava via a pursestring suture.  Cardiopulmonary bypass was initiated.  A 36-French malleable venous cannula was placed via  pursestring suture in the inferior aspect of the right atrium and directed into the inferior vena cava.  It was then connected to the bypass circuit.  The patient was cooled to 34 degrees Celsius.  The AtriCure bipolar radiofrequency device and cryoprobe  were used for the maze procedure.  The left sided pulmonary veins were encircled.  Radiofrequency device was used to perform ablation on the left atrium just proximal to their insertion site, 3 parallel ablation lines were performed.  Two ablations were  done at each line.  The base of the left atrial appendage then was ablated in a similar fashion.  A retrograde cardioplegia cannula was placed via pursestring suture in the right atrium and directed into the coronary sinus.  A 45 mm AtriCure Flex V clip  was placed on the left atrial appendage at its base.  An antegrade cardioplegia cannula was placed in the ascending aorta.  The aorta was crossclamped.  Cardiac arrest was achieved with a combination of cold antegrade and retrograde KBC blood  cardioplegia and topical iced saline.  Initial  500 mL of cardioplegia was administered antegrade.  There was a diastolic arrest.  An additional 500 mL of cardioplegia was administered retrograde and there was septal cooling to 10  degrees Celsius.  A left atriotomy was performed.  The radiofrequency device was used to complete the isolation of the right sided pulmonary veins again with 3 parallel ablation lines with 2 ablations performed at each line.  The box lesion and the connection to the  mitral annulus were performed with a cryoprobe taking it to a temperature of -70 degrees Celsius for 2 minutes.  A vent was left in the left ventricle and the left atriotomy was closed in 2 layers with running 4-0 Prolene sutures.  The first layer was a  horizontal mattress suture. Rumel tourniquet was placed on this and then the second layer was a running simple suture.  An aortotomy was performed.  The valve was inspected.  There was a 6 x 8 mm sessile tumor on the ventricular side of the left coronary cusp.  This cusp was a little more posteriorly situated than normal.  The tumor was shaved off the ventricular surface of  the leaflet.  All abnormal tissue appeared to be removed.  The remaining leaflets were normal.  The coronary ostia were widely patent.  The aortotomy was closed in 2 layers, again using a running 4-0 Prolene horizontal mattress suture followed by running  4-0 Prolene simple suture.  An incision was made into the right atrium and the tip of the right atrial appendage was excised.  The radiofrequency device was used to perform ablations with 3 pairs of parallel ablation lines to the superior and inferior  vena cava.  The cryoprobe was used to create the lesion into the coronary sinus and to the tricuspid valve.  Rewarming was begun.  The right atriotomy was closed in 2 layers.  The tip of the appendix then was oversewn with a 4-0 Prolene suture and  de-airing was performed.  The patient was placed in Trendelenburg position.  A reanimation dose of warm cardioplegia was administered via the retrograde cannula.  De-airing was performed and the aortic crossclamp was removed.  The total crossclamp time  was 69 minutes.  The  patient spontaneously resumed a narrow complex bradycardic rhythm and this appeared to be a junctional rhythm in the 30s.  All of the suture lines were inspected for hemostasis.  Epicardial pacing wires were placed on the right  ventricle and right atrium.  Rewarming was relatively slow and the patient had been rewarmed to a core temperature of 36 degrees Celsius, she was weaned from cardiopulmonary bypass on the first attempt.  She was not on inotropic support initially.   Initial cardiac index was greater than 2 liters per minute per meter squared.  The post-bypass transesophageal echocardiogram showed preserved left ventricular systolic function.  The left ventricular hypertrophy was unchanged.  The aortic valve  leaflets moved normally and there was no aortic insufficiency.  A test dose of protamine was administered and was well tolerated.  The left ventricular vent was removed, and after seeing no additional air, the aortic root vent was removed.  The inferior vena  cava cannula was removed and that site was reinforced with a 4-0 Prolene suture and then the superior vena cava cannula was removed.  The ascending aortic cannula was removed.  There was some minor bleeding that was controlled with a 4-0 Prolene  pledgeted suture.  The remainder of the protamine was administered without incident.  The chest was irrigated with warm saline.  Hemostasis was achieved.  The pericardium was reapproximated over the ascending aorta and base of the heart with interrupted  3-0 silk sutures that came together easily without tension.  The sternum was closed with a combination of single and double heavy gauge stainless steel wires. After closure of the sternum the patient's hemodynamics remained the same.  She was being DDD  paced at 80 beats per minute.  Cardiac index dropped.  She was started on a low dose milrinone infusion while volume resuscitation continued. The pectoralis fascia, subcutaneous tissue and skin were  closed in standard fashion.  All sponge, needle and  instrument counts were correct at the end of the procedure.  The patient was taken from the operating room to the surgical intensive care unit, intubated and in good condition.  Experienced assistance was necessary for this case.  Jillyn Hidden served as Museum/gallery curator and provided exposure, retraction of delicate tissues, suture management, and suctioning.   PUS D: 03/06/2022 5:44:30 pm T: 03/06/2022 6:28:00 pm  JOB: [id number]/ [id number]

## 2022-03-06 NOTE — Progress Notes (Signed)
      [address]       [place] [id number]             [phone]      Intubated, sedated Waking up and following commands  BP (!) 87/58   Pulse 85   Temp 98.1 F (36.7 C)   Resp 18   Ht '5\' 4"'$  (1.626 m)   Wt 65.3 kg   SpO2 100%   BMI 24.72 kg/m  CI =2.3 on milrinone 0.125, norepi 10  Intake/Output Summary (Last 24 hours) at 03/06/2022 1822 Last data filed at 03/06/2022 1800 Gross per 24 hour  Intake 5668.93 ml  Output 2635 ml  Net 3033.93 ml   CT ~ 500 ml since OR  Hgb 9.3, PLT 142K Creatinine 0.74, K = 4.0  Doing well early postop  Wean to extubate  Remo Lipps C. Roxan Hockey, MD [facility] [phone]

## 2022-03-06 NOTE — Hospital Course (Signed)
PCP is Whitaker, Rolanda Jay, PA-C Referring Provider is Constance Haw, MD  History of Present Illness: Ms. Michie was sent for consultation regarding a papillary fibroelastoma of the aortic valve.  Hannah Zimmerman is a 51 year old woman with a past history significant for stroke, tumor of the aortic valve, persistent atrial fibrillation, pulmonary hypertension, hypertrophic cardiomyopathy without obstruction, morbid obesity status post gastric bypass surgery, and tobacco abuse.   She has had atrial fibrillation for about 4 years.  She has been on Eliquis.  In February she had a cardioversion on Tuesday.  The following Saturday she had a stroke.  There was characterized by slurring of speech, right gaze deviation, and left-sided facial droop, neglect, and left hemiplegia.  She underwent endovascular thrombectomy.  She had recovery of most of her neurologic deficits but does still have some residual effects on speech, balance, coordination, and holding objects.   During her evaluation she had a TEE which showed a 6 mm tumor on the ventricular side of the noncoronary cusp of the aortic valve consistent with a papillary fibroelastoma  She had a stroke in February involving her right MCA.  Potential sources include atrial fibrillation with recent cardioversion and the tumor on her aortic valve.  It is possible that neither of these issues were involved.  However, I suspect it was  one or the other.   We discussed the possibility of surgical resection of the aortic valve tumor along with a Maze procedure, left atrial clipping, and closure of PFO if one is present.  I informed her of the general nature of the procedure including the incisions to be used, the use of cardiopulmonary bypass, the use of temporary pacemaker wires and drainage tubes postoperatively, the expected hospital stay, and the overall recovery.  I informed her of the indications, risks, benefits, and alternatives.  She understands  the risks include, but not limited to death, MI, DVT, PE, bleeding, possible need for transfusion, infection, cardiac arrhythmias, complete heart block requiring pacemaker placement, as well as possibility of other unforeseeable complications such as renal respiratory failure or gastrointestinal complications.  She understands there is no guarantee that she would not have another stroke.  She wishes to proceed.   Hospital Course: Ms. Gilford Rile was admitted for elective surgery on 03/06/2022.  She was taken to the operating room where median sternotomy was performed along with a MAZE procedure and clipping of the left atrial appendage.  Following the procedure, she separated from cardiopulmonary bypass without difficulty and was transferred to the surgical ICU.

## 2022-03-06 NOTE — Procedures (Signed)
Extubation Procedure Note  Patient Details:   Name: Hannah Zimmerman DOB: March 23, 1971 MRN: [id number]   Airway Documentation:    Vent end date: 03/06/22 Vent end time: 2003   Evaluation  O2 sats: stable throughout Complications: No apparent complications Patient did tolerate procedure well. Bilateral Breath Sounds: Clear   Yes  Pt ext to 4L [name] per protocol.  NIF: -30 VC: .8L IS: 550m best of three. Positive cuff leak, pt able to say her name. No stridor noted. Pt tolerated procedure well.   [name]6/19/2023, 8:04 PM

## 2022-03-06 NOTE — Brief Op Note (Signed)
03/06/2022  10:51 AM  PATIENT:  Hannah Zimmerman  51 y.o. female  PRE-OPERATIVE DIAGNOSIS:  PAPILLARY FIBROELASTOMA  AORTIC VALVE AFIB POSSIBLE PFO  POST-OPERATIVE DIAGNOSIS:  PAPILLARY FIBROELASTOMA  AORTIC VALVE AFIB   PROCEDURES:   RESECTION OF AORTIC VALVE TUMOR   MAZE PROCEDURE  CLIPPING OF ATRIAL APPENDAGE   TRANSESOPHAGEAL ECHOCARDIOGRAM   SURGEON: Melrose Nakayama, MD - Primary  PHYSICIAN ASSISTANT:Crispin Vogel  ASSISTANTS: Gaylyn Cheers, RN, Scrub Person   ANESTHESIA:   general  EBL:  174m   BLOOD ADMINISTERED:none  DRAINS:  Mediastinal Blake drains    LOCAL MEDICATIONS USED:  NONE  SPECIMEN:  Aortic valve tumor  DISPOSITION OF SPECIMEN:  PATHOLOGY  COUNTS:  Correct  DICTATION: .Dragon Dictation  PLAN OF CARE: Admit to inpatient   PATIENT DISPOSITION:  ICU - intubated and hemodynamically stable.   Delay start of Pharmacological VTE agent (>24hrs) due to surgical blood loss or risk of bleeding: yes

## 2022-03-06 NOTE — Anesthesia Procedure Notes (Signed)
Central Venous Catheter Insertion Start/End6/19/2023 7:23 AM, 03/06/2022 7:33 AM Patient location: Pre-op. Preanesthetic checklist: patient identified, IV checked, site marked, risks and benefits discussed, surgical consent, monitors and equipment checked, pre-op evaluation, timeout performed and anesthesia consent Hand hygiene performed  and maximum sterile barriers used  PA cath was placed.Swan type:thermodilution Procedure performed without using ultrasound guided technique. Attempts: 1 Patient tolerated the procedure well with no immediate complications.

## 2022-03-06 NOTE — Transfer of Care (Signed)
Immediate Anesthesia Transfer of Care Note  Patient: Hannah Zimmerman  Procedure(s) Performed: RESECTION OF AORTIC VALVE TUMOR MAZE CLIPPING OF ATRIAL APPENDAGE USING ATRICURE  CLIP SIZE 45MM TRANSESOPHAGEAL ECHOCARDIOGRAM (TEE)  Patient Location: SICU  Anesthesia Type:General  Level of Consciousness: Patient remains intubated per anesthesia plan  Airway & Oxygen Therapy: Patient remains intubated per anesthesia plan and Patient placed on Ventilator (see vital sign flow sheet for setting)  Post-op Assessment: Post -op Vital signs reviewed and stable  Post vital signs: Reviewed and stable  Last Vitals:  Vitals Value Taken Time  BP 111/89 03/06/22 1214  Temp    Pulse 85 03/06/22 1221  Resp 7 03/06/22 1221  SpO2 100 % 03/06/22 1221  Vitals shown include unvalidated device data.  Last Pain:  Vitals:   03/06/22 0608  TempSrc:   PainSc: 0-No pain         Complications: No notable events documented.

## 2022-03-06 NOTE — Progress Notes (Signed)
Metoprolol held due to HR 50s.

## 2022-03-06 NOTE — Anesthesia Procedure Notes (Signed)
Central Venous Catheter Insertion Start/End6/19/2023 7:14 AM, 03/06/2022 7:34 AM Patient location: Pre-op. Preanesthetic checklist: patient identified, IV checked, site marked, risks and benefits discussed, surgical consent, monitors and equipment checked, pre-op evaluation, timeout performed and anesthesia consent Position: supine Lidocaine 1% used for infiltration and patient sedated Hand hygiene performed , maximum sterile barriers used  and Seldinger technique used Catheter size: 8.5 Fr Total catheter length 10. Central line was placed.Sheath introducer Procedure performed using ultrasound guided technique. Ultrasound Notes:anatomy identified, needle tip was noted to be adjacent to the nerve/plexus identified, no ultrasound evidence of intravascular and/or intraneural injection and image(s) printed for medical record Attempts: 1 Following insertion, line sutured, dressing applied and Biopatch. Post procedure assessment: blood return through all ports, free fluid flow and no air  Patient tolerated the procedure well with no immediate complications.

## 2022-03-06 NOTE — Anesthesia Procedure Notes (Signed)
Procedure Name: Intubation Date/Time: 03/06/2022 8:06 AM  Performed by: Anastasio Auerbach, CRNAPre-anesthesia Checklist: Patient identified, Emergency Drugs available, Suction available and Patient being monitored Patient Re-evaluated:Patient Re-evaluated prior to induction Oxygen Delivery Method: Circle system utilized Preoxygenation: Pre-oxygenation with 100% oxygen Induction Type: IV induction Ventilation: Mask ventilation without difficulty Laryngoscope Size: Mac and 3 Grade View: Grade I Tube type: Oral Tube size: 8.0 mm Number of attempts: 1 Airway Equipment and Method: Stylet and Oral airway Placement Confirmation: ETT inserted through vocal cords under direct vision, positive ETCO2 and breath sounds checked- equal and bilateral Secured at: 21 cm Tube secured with: Tape Dental Injury: Teeth and Oropharynx as per pre-operative assessment

## 2022-03-06 NOTE — Progress Notes (Signed)
Received from [facility], report given by CRNA. Connected to vent and bedside monitor. See flowsheet for full assessment. VSS on neo, milrinone, precedex, and insulin. Dr Roxan Hockey at bedside. Upadated on condition.

## 2022-03-06 NOTE — Anesthesia Preprocedure Evaluation (Addendum)
Anesthesia Evaluation  Patient identified by MRN, date of birth, ID band Patient awake    Reviewed: Allergy & Precautions, NPO status , Patient's Chart, lab work & pertinent test results  History of Anesthesia Complications (+) PROLONGED EMERGENCE and history of anesthetic complications  Airway Mallampati: III  TM Distance: >3 FB Neck ROM: Full    Dental  (+) Dental Advisory Given   Pulmonary neg shortness of breath, neg sleep apnea, neg COPD, neg recent URI, Current Smoker and Patient abstained from smoking.,    breath sounds clear to auscultation       Cardiovascular + CAD and +CHF  + dysrhythmias Atrial Fibrillation + Valvular Problems/Murmurs  Rhythm:Regular  1. Left ventricular ejection fraction, by estimation, is 60 to 65%. The  left ventricle has normal function. The left ventricle has no regional  wall motion abnormalities. There is severe concentric left ventricular  hypertrophy. Indeterminate diastolic  filling due to E-A fusion. Elevated left ventricular end-diastolic  pressure.  2. Right ventricular systolic function is normal. The right ventricular  size is moderately enlarged.  3. Right atrial size was moderately dilated.  4. Left atrial size was severely dilated.  5. The mitral valve is normal in structure. Mild mitral valve  regurgitation. No evidence of mitral stenosis.  6. There is a soft tissue density on the noncoronary cusp extending into  the LVOT. 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 Differential dx includes  endocarditis (in right clinical setting) or valvular myxmoa. Images from  TEE done 11/08/21 demonstrate the  same soft tissue density (images 34-37 and 42 on TEE). There is also a  prominent nodule of Arantius on the [facility] that is normal finding.  7. Recommend cMRI for further tissue evaluation of the aortic valve mass.    Neuro/Psych CVA negative psych ROS   GI/Hepatic Neg liver ROS,  hiatal hernia, S/p gastric sleeve and hiatal hernia repair   Endo/Other  negative endocrine ROS  Renal/GU negative Renal ROS     Musculoskeletal negative musculoskeletal ROS (+)   Abdominal   Peds  Hematology  (+) Blood dyscrasia, , eliquis     Anesthesia Other Findings   Reproductive/Obstetrics                            Anesthesia Physical Anesthesia Plan  ASA: 3  Anesthesia Plan: General   Post-op Pain Management:    Induction: Intravenous  PONV Risk Score and Plan: 2 and Treatment may vary due to age or medical condition and Ondansetron  Airway Management Planned: Oral ETT  Additional Equipment: Arterial line, CVP, PA Cath, TEE and Ultrasound Guidance Line Placement  Intra-op Plan:   Post-operative Plan: Post-operative intubation/ventilation  Informed Consent: I have reviewed the patients History and Physical, chart, labs and discussed the procedure including the risks, benefits and alternatives for the proposed anesthesia with the patient or authorized representative who has indicated his/her understanding and acceptance.     Dental advisory given  Plan Discussed with: CRNA  Anesthesia Plan Comments:         Anesthesia Quick Evaluation

## 2022-03-07 ENCOUNTER — Inpatient Hospital Stay (HOSPITAL_COMMUNITY): Payer: Commercial Managed Care - HMO

## 2022-03-07 ENCOUNTER — Encounter (HOSPITAL_COMMUNITY): Payer: Self-pay | Admitting: Thoracic Surgery (Cardiothoracic Vascular Surgery)

## 2022-03-07 LAB — GLUCOSE, CAPILLARY
Glucose-Capillary: 104 mg/dL — ABNORMAL HIGH (ref 70–99)
Glucose-Capillary: 106 mg/dL — ABNORMAL HIGH (ref 70–99)
Glucose-Capillary: 120 mg/dL — ABNORMAL HIGH (ref 70–99)
Glucose-Capillary: 124 mg/dL — ABNORMAL HIGH (ref 70–99)
Glucose-Capillary: 124 mg/dL — ABNORMAL HIGH (ref 70–99)
Glucose-Capillary: 132 mg/dL — ABNORMAL HIGH (ref 70–99)
Glucose-Capillary: 132 mg/dL — ABNORMAL HIGH (ref 70–99)
Glucose-Capillary: 136 mg/dL — ABNORMAL HIGH (ref 70–99)
Glucose-Capillary: 140 mg/dL — ABNORMAL HIGH (ref 70–99)
Glucose-Capillary: 159 mg/dL — ABNORMAL HIGH (ref 70–99)
Glucose-Capillary: 72 mg/dL (ref 70–99)
Glucose-Capillary: 80 mg/dL (ref 70–99)
Glucose-Capillary: 87 mg/dL (ref 70–99)
Glucose-Capillary: 87 mg/dL (ref 70–99)

## 2022-03-07 LAB — BASIC METABOLIC PANEL
Anion gap: 6 (ref 5–15)
Anion gap: 9 (ref 5–15)
BUN: 9 mg/dL (ref 6–20)
BUN: 11 mg/dL (ref 6–20)
CO2: 22 mmol/L (ref 22–32)
CO2: 21 mmol/L — ABNORMAL LOW (ref 22–32)
Calcium: 8.1 mg/dL — ABNORMAL LOW (ref 8.9–10.3)
Calcium: 8.4 mg/dL — ABNORMAL LOW (ref 8.9–10.3)
Chloride: 111 mmol/L (ref 98–111)
Chloride: 108 mmol/L (ref 98–111)
Creatinine, Ser: 0.71 mg/dL (ref 0.44–1.00)
Creatinine, Ser: 0.8 mg/dL (ref 0.44–1.00)
GFR, Estimated: >60 mL/min (ref >60)
GFR, Estimated: >60 mL/min (ref >60)
Glucose, Bld: 125 mg/dL — ABNORMAL HIGH (ref 70–99)
Glucose, Bld: 151 mg/dL — ABNORMAL HIGH (ref 70–99)
Potassium: 4 mmol/L (ref 3.5–5.1)
Potassium: 4.4 mmol/L (ref 3.5–5.1)
Sodium: 139 mmol/L (ref 135–145)
Sodium: 138 mmol/L (ref 135–145)

## 2022-03-07 LAB — COOXEMETRY PANEL
Carboxyhemoglobin: 1.5 % (ref 0.5–1.5)
Methemoglobin: <0.7 % (ref 0.0–1.5)
O2 Saturation: 52.7 %
Total hemoglobin: 8.8 g/dL — ABNORMAL LOW (ref 12.0–16.0)

## 2022-03-07 LAB — CBC
HCT: 26.8 % — ABNORMAL LOW (ref 36.0–46.0)
HCT: 27.9 % — ABNORMAL LOW (ref 36.0–46.0)
Hemoglobin: 8.9 g/dL — ABNORMAL LOW (ref 12.0–15.0)
Hemoglobin: 9 g/dL — ABNORMAL LOW (ref 12.0–15.0)
MCH: 34.1 pg — ABNORMAL HIGH (ref 26.0–34.0)
MCH: 33.7 pg (ref 26.0–34.0)
MCHC: 33.2 g/dL (ref 30.0–36.0)
MCHC: 32.3 g/dL (ref 30.0–36.0)
MCV: 102.7 fL — ABNORMAL HIGH (ref 80.0–100.0)
MCV: 104.5 fL — ABNORMAL HIGH (ref 80.0–100.0)
Platelets: 149 10*3/uL — ABNORMAL LOW (ref 150–400)
Platelets: 140 10*3/uL — ABNORMAL LOW (ref 150–400)
RBC: 2.61 MIL/uL — ABNORMAL LOW (ref 3.87–5.11)
RBC: 2.67 MIL/uL — ABNORMAL LOW (ref 3.87–5.11)
RDW: 15.2 % (ref 11.5–15.5)
RDW: 15.5 % (ref 11.5–15.5)
WBC: 15.4 10*3/uL — ABNORMAL HIGH (ref 4.0–10.5)
WBC: 19.4 10*3/uL — ABNORMAL HIGH (ref 4.0–10.5)
nRBC: 0 % (ref 0.0–0.2)
nRBC: 0 % (ref 0.0–0.2)

## 2022-03-07 LAB — SURGICAL PATHOLOGY

## 2022-03-07 LAB — POCT ACTIVATED CLOTTING TIME
Activated Clotting Time: 151 seconds
Activated Clotting Time: 674 seconds
Activated Clotting Time: 747 seconds
Activated Clotting Time: 606 seconds
Activated Clotting Time: 126 seconds
Activated Clotting Time: 427 seconds

## 2022-03-07 LAB — MAGNESIUM: Magnesium: 2.7 mg/dL — ABNORMAL HIGH (ref 1.7–2.4)

## 2022-03-07 MED ORDER — ALBUMIN HUMAN 5 % IV SOLN
12.5000 g | Freq: Once | INTRAVENOUS | Status: AC
  Administered 2022-03-07: 12.5 g via INTRAVENOUS
  Filled 2022-03-07: qty 250

## 2022-03-07 MED ORDER — ENOXAPARIN SODIUM 40 MG/0.4ML IJ SOSY
40.0000 mg | PREFILLED_SYRINGE | Freq: Every day | INTRAMUSCULAR | Status: DC
Start: 2022-03-07 — End: 2022-03-14
  Administered 2022-03-07 – 2022-03-13 (×7): 40 mg via SUBCUTANEOUS
  Filled 2022-03-07 (×7): qty 0.4

## 2022-03-07 MED ORDER — FUROSEMIDE 10 MG/ML IJ SOLN
20.0000 mg | Freq: Two times a day (BID) | INTRAMUSCULAR | Status: AC
  Administered 2022-03-07 (×2): 20 mg via INTRAVENOUS
  Filled 2022-03-07 (×2): qty 2

## 2022-03-07 MED ORDER — INSULIN ASPART 100 UNIT/ML IJ SOLN
0.0000 [IU] | INTRAMUSCULAR | Status: DC
  Administered 2022-03-07 (×2): 2 [IU] via SUBCUTANEOUS

## 2022-03-07 MED ORDER — MIDODRINE HCL 5 MG PO TABS
10.0000 mg | ORAL_TABLET | Freq: Two times a day (BID) | ORAL | Status: DC
Start: 2022-03-07 — End: 2022-03-14
  Administered 2022-03-07 – 2022-03-14 (×16): 10 mg via ORAL
  Filled 2022-03-07 (×16): qty 2

## 2022-03-07 MED ORDER — ASPIRIN 81 MG PO TBEC
81.0000 mg | DELAYED_RELEASE_TABLET | Freq: Every day | ORAL | Status: DC
  Administered 2022-03-07 – 2022-03-14 (×8): 81 mg via ORAL
  Filled 2022-03-07 (×8): qty 1

## 2022-03-07 MED ORDER — INSULIN DETEMIR 100 UNIT/ML ~~LOC~~ SOLN
10.0000 [IU] | Freq: Two times a day (BID) | SUBCUTANEOUS | Status: DC
Start: 2022-03-07 — End: 2022-03-08
  Administered 2022-03-07 (×2): 10 [IU] via SUBCUTANEOUS
  Filled 2022-03-07 (×4): qty 0.1

## 2022-03-07 MED ORDER — METOCLOPRAMIDE HCL 5 MG/ML IJ SOLN
10.0000 mg | Freq: Four times a day (QID) | INTRAMUSCULAR | Status: AC
  Administered 2022-03-07 – 2022-03-08 (×5): 10 mg via INTRAVENOUS
  Filled 2022-03-07 (×5): qty 2

## 2022-03-07 NOTE — Progress Notes (Addendum)
TCTS DAILY ICU PROGRESS NOTE                   [address]            [place] [address]          [phone]   1 Day Post-Op Procedure(s) (LRB): RESECTION OF AORTIC VALVE TUMOR (N/A) MAZE (N/A) CLIPPING OF ATRIAL APPENDAGE USING ATRICURE  CLIP SIZE 45MM (N/A) TRANSESOPHAGEAL ECHOCARDIOGRAM (TEE) (N/A)  Total Length of Stay:  LOS: 1 day   Subjective: Extubated around 8pm last night. Awake with appropriate mental status. Nausea with several episodes of vomiting, denies abdominal pain.   On Milrinone and Levophed, CI 2.0  Objective: Vital signs in last 24 hours: Temp:  [95.7 F (35.4 C)-98.4 F (36.9 C)] 97.5 F (36.4 C) (06/20 0700) Pulse Rate:  [85-88] 88 (06/20 0700) Cardiac Rhythm: A-V Sequential paced (06/20 0400) Resp:  [9-21] 14 (06/20 0700) BP: (84-128)/(57-94) 99/65 (06/20 0700) SpO2:  [95 %-100 %] 98 % (06/20 0700) Arterial Line BP: (69-129)/(45-85) 114/60 (06/20 0700) FiO2 (%):  [40 %-50 %] 40 % (06/19 1850) Weight:  [71.7 kg] 71.7 kg (06/20 0546)  Filed Weights   03/06/22 0541 03/07/22 0546  Weight: 65.3 kg 71.7 kg    Weight change: 6.35 kg   Hemodynamic parameters for last 24 hours: PAP: (24-60)/(11-53) 30/17 CO:  [2.3 L/min-3.8 L/min] 3.4 L/min CI:  [1.4 L/min/m2-2.3 L/min/m2] 2 L/min/m2  Intake/Output from previous day: 06/19 0701 - 06/20 0700 In: 7280.8 [P.O.:1; I.V.:3846.4; Blood:350; IV Piggyback:3083.4] Out: 6283 [Urine:2270; Blood:525; Chest Tube:950]  Intake/Output this shift: No intake/output data recorded.  Current Meds: Scheduled Meds:  acetaminophen  1,000 mg Oral Q6H   Or   acetaminophen (TYLENOL) oral liquid 160 mg/5 mL  1,000 mg Per Tube Q6H   amiodarone  200 mg Oral Daily   aspirin EC  325 mg Oral Daily   Or   aspirin  324 mg Per Tube Daily   atorvastatin  40 mg Oral QHS   bisacodyl  10 mg Oral Daily   Or   bisacodyl  10 mg Rectal Daily   Chlorhexidine Gluconate Cloth  6 each Topical Daily   docusate  sodium  200 mg Oral Daily   escitalopram  10 mg Oral Daily   metoCLOPramide (REGLAN) injection  10 mg Intravenous Q6H   metoprolol tartrate  12.5 mg Oral BID   Or   metoprolol tartrate  12.5 mg Per Tube BID   [START ON 03/08/2022] pantoprazole  40 mg Oral Daily   sodium chloride flush  3 mL Intravenous Q12H   Continuous Infusions:  sodium chloride 10 mL/hr at 03/06/22 1245   sodium chloride     sodium chloride      ceFAZolin (ANCEF) IV Stopped (03/07/22 0617)   dexmedetomidine (PRECEDEX) IV infusion Stopped (03/06/22 1723)   famotidine (PEPCID) IV Stopped (03/06/22 1446)   insulin 0.8 mL/hr at 03/07/22 0700   lactated ringers     lactated ringers Stopped (03/06/22 1724)   lactated ringers Stopped (03/07/22 0433)   milrinone 0.125 mcg/kg/min (03/07/22 0700)   nitroGLYCERIN 5 mcg/min (03/06/22 1213)   norepinephrine (LEVOPHED) Adult infusion 12 mcg/min (03/07/22 0700)   phenylephrine (NEO-SYNEPHRINE) Adult infusion 35 mcg/min (03/07/22 0700)   PRN Meds:.sodium chloride, ALPRAZolam, dextrose, fentaNYL (SUBLIMAZE) injection, lactated ringers, metoprolol tartrate, midazolam, ondansetron (ZOFRAN) IV, oxyCODONE, sodium chloride flush, traMADol  General appearance: alert, cooperative, and mild distress Neurologic: intact Heart: JR in 50's, currently A-paced at 36  with good capture Lungs: breath sounds clear anterior Abdomen: soft, NT Extremities: warm Wound: covered with a dry dressing  Lab Results: CBC: Recent Labs    03/06/22 1743 03/06/22 1937 03/06/22 2124 03/07/22 0326  WBC 18.0*  --   --  15.4*  HGB 9.3*   < > 8.8* 8.9*  HCT 27.8*   < > 26.0* 26.8*  PLT 142*  --   --  149*   < > = values in this interval not displayed.   BMET:  Recent Labs    03/06/22 2131 03/07/22 0326  NA 140 139  K 3.9 4.0  CL 110 111  CO2 21* 22  GLUCOSE 147* 125*  BUN 9 9  CREATININE 0.72 0.71  CALCIUM 8.2* 8.1*    CMET: Lab Results  Component Value Date   WBC 15.4 (H) 03/07/2022    HGB 8.9 (L) 03/07/2022   HCT 26.8 (L) 03/07/2022   PLT 149 (L) 03/07/2022   GLUCOSE 125 (H) 03/07/2022   CHOL 128 01/18/2022   TRIG 65 01/18/2022   HDL 50 01/18/2022   LDLCALC 64 01/18/2022   ALT 26 03/03/2022   AST 36 03/03/2022   NA 139 03/07/2022   K 4.0 03/07/2022   CL 111 03/07/2022   CREATININE 0.71 03/07/2022   BUN 9 03/07/2022   CO2 22 03/07/2022   TSH 2.226 07/30/2019   INR 1.5 (H) 03/06/2022   HGBA1C 5.2 03/02/2022      PT/INR:  Recent Labs    03/06/22 1240  LABPROT 18.3*  INR 1.5*   Radiology: DG Chest Port 1 View  Result Date: 03/06/2022 CLINICAL DATA:  Post Maze procedure EXAM: PORTABLE CHEST 1 VIEW COMPARISON:  03/02/2022 FINDINGS: Endotracheal tube is 1.6 cm above the carina. Enteric tube passes into the stomach. Right IJ Swan-Ganz catheter tip overlies the right pulmonary artery. No pneumothorax. No consolidation or edema. No pleural effusion. Dilated main pulmonary artery. Dilated left atrium. Normal heart size. Atrial appendage clip and sternotomy wires. IMPRESSION: Lines and tubes as above.  No pneumothorax. Electronically Signed   By: Macy Mis M.D.   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 Stable VS and hemodynamics since surgery. Wean support, leave chest tubes for drainage.  -H/O persistent atrial fibrillation- s/p MAZE and left atrial clip- currently in [place], A-pacing with good conduction. Amiodarone resumed post-op.  -GI- nausea with vomiting. Abd exam benign. Continue PPI, add Reglan.   -HEME- expected acute blood loss anemia-drainage tapering off. Monitor.  -RENAL- normal renal function. Wt ~6kg+ since surgery. Hold off on diuresis until off pressors.   -NEURO- intact, h/o CVA with minimal  residual deficit. Mobilize as  able.   -DVT-PPX- to start enoxaparin later today.    Antony Odea, PA-C [phone] 03/07/2022 7:40 AM   Patient seen and examined, agree with above Primary c/o is nausea In junctional rate of 50- atrial paced- stop metoprolol  Will continue amiodarone PO Will give albumin + Lasix this AM Dc milrinone, wean norepi, was on midodrine at home- will resume Will resume Eliquis prior to [place]. Roxan Hockey, MD [facility] [phone]

## 2022-03-07 NOTE — Progress Notes (Signed)
EVENING ROUNDS NOTE :     [address]       [place] [address]             [phone]                 1 Day Post-Op Procedure(s) (LRB): RESECTION OF AORTIC VALVE TUMOR (N/A) MAZE (N/A) CLIPPING OF ATRIAL APPENDAGE USING ATRICURE  CLIP SIZE 45MM (N/A) TRANSESOPHAGEAL ECHOCARDIOGRAM (TEE) (N/A)   Total Length of Stay:  LOS: 1 day  Events:   No events Resting comfortably    BP 99/81   Pulse 88   Temp 97.6 F (36.4 C) (Oral)   Resp 11   Ht '5\' 4"'$  (1.626 m)   Wt 71.7 kg   SpO2 97%   BMI 27.12 kg/m   PAP: (24-60)/(11-53) 29/18 CO:  [3.3 L/min-3.5 L/min] 3.4 L/min CI:  [2 L/min/m2-2.1 L/min/m2] 2 L/min/m2  Vent Mode: PSV;CPAP FiO2 (%):  [40 %] 40 % Set Rate:  [4 bmp] 4 bmp Vt Set:  [430 mL] 430 mL PEEP:  [5 cmH20] 5 cmH20 Pressure Support:  [10 cmH20] 10 cmH20   sodium chloride 10 mL/hr at 03/06/22 1245   sodium chloride Stopped (03/07/22 0700)   sodium chloride      ceFAZolin (ANCEF) IV Stopped (03/07/22 1536)   insulin 0.8 mL/hr at 03/07/22 1600   lactated ringers     lactated ringers Stopped (03/06/22 1724)   lactated ringers Stopped (03/07/22 0433)   nitroGLYCERIN 5 mcg/min (03/06/22 1213)   norepinephrine (LEVOPHED) Adult infusion 3 mcg/min (03/07/22 1600)   phenylephrine (NEO-SYNEPHRINE) Adult infusion 35 mcg/min (03/07/22 1400)    I/O last 3 completed shifts: In: 7280.8 [P.O.:1; I.V.:3846.4; Blood:350; IV Piggyback:3083.4] Out: 7048 [Urine:2270; Blood:525; Chest Tube:950]      Latest Ref Rng & Units 03/07/2022    4:12 PM 03/07/2022    3:26 AM 03/06/2022    9:24 PM  CBC  WBC 4.0 - 10.5 K/uL 19.4  15.4    Hemoglobin 12.0 - 15.0 g/dL 9.0  8.9  8.8   Hematocrit 36.0 - 46.0 % 27.9  26.8  26.0   Platelets 150 - 400 K/uL 140  149         Latest Ref Rng & Units 03/07/2022    4:12 PM 03/07/2022    3:26 AM 03/06/2022    9:31 PM  BMP  Glucose 70 - 99 mg/dL 151  125  147   BUN 6 - 20 mg/dL '11  9  9   '$ Creatinine 0.44 - 1.00 mg/dL 0.80   0.71  0.72   Sodium 135 - 145 mmol/L 138  139  140   Potassium 3.5 - 5.1 mmol/L 4.4  4.0  3.9   Chloride 98 - 111 mmol/L 108  111  110   CO2 22 - 32 mmol/L '21  22  21   '$ Calcium 8.9 - 10.3 mg/dL 8.4  8.1  8.2     ABG    Component Value Date/Time   PHART 7.314 (L) 03/06/2022 2124   PCO2ART 40.1 03/06/2022 2124   PO2ART 139 (H) 03/06/2022 2124   HCO3 20.5 03/06/2022 2124   TCO2 22 03/06/2022 2124   ACIDBASEDEF 5.0 (H) 03/06/2022 2124   O2SAT 52.7 03/07/2022 0820       Melodie Bouillon, MD 03/07/2022 6:03 PM

## 2022-03-08 ENCOUNTER — Inpatient Hospital Stay (HOSPITAL_COMMUNITY): Payer: Commercial Managed Care - HMO

## 2022-03-08 LAB — GLUCOSE, CAPILLARY
Glucose-Capillary: 106 mg/dL — ABNORMAL HIGH (ref 70–99)
Glucose-Capillary: 115 mg/dL — ABNORMAL HIGH (ref 70–99)
Glucose-Capillary: 115 mg/dL — ABNORMAL HIGH (ref 70–99)
Glucose-Capillary: 116 mg/dL — ABNORMAL HIGH (ref 70–99)
Glucose-Capillary: 104 mg/dL — ABNORMAL HIGH (ref 70–99)
Glucose-Capillary: 124 mg/dL — ABNORMAL HIGH (ref 70–99)

## 2022-03-08 LAB — BASIC METABOLIC PANEL
Anion gap: 5 (ref 5–15)
BUN: 12 mg/dL (ref 6–20)
CO2: 23 mmol/L (ref 22–32)
Calcium: 8.6 mg/dL — ABNORMAL LOW (ref 8.9–10.3)
Chloride: 109 mmol/L (ref 98–111)
Creatinine, Ser: 0.61 mg/dL (ref 0.44–1.00)
GFR, Estimated: >60 mL/min (ref >60)
Glucose, Bld: 100 mg/dL — ABNORMAL HIGH (ref 70–99)
Potassium: 4.4 mmol/L (ref 3.5–5.1)
Sodium: 137 mmol/L (ref 135–145)

## 2022-03-08 LAB — COOXEMETRY PANEL
Carboxyhemoglobin: 1.4 % (ref 0.5–1.5)
Methemoglobin: <0.7 % (ref 0.0–1.5)
O2 Saturation: 56.5 %
Total hemoglobin: 9.3 g/dL — ABNORMAL LOW (ref 12.0–16.0)

## 2022-03-08 LAB — CBC
HCT: 26.6 % — ABNORMAL LOW (ref 36.0–46.0)
Hemoglobin: 8.8 g/dL — ABNORMAL LOW (ref 12.0–15.0)
MCH: 34.5 pg — ABNORMAL HIGH (ref 26.0–34.0)
MCHC: 33.1 g/dL (ref 30.0–36.0)
MCV: 104.3 fL — ABNORMAL HIGH (ref 80.0–100.0)
Platelets: 119 10*3/uL — ABNORMAL LOW (ref 150–400)
RBC: 2.55 MIL/uL — ABNORMAL LOW (ref 3.87–5.11)
RDW: 15.6 % — ABNORMAL HIGH (ref 11.5–15.5)
WBC: 16.1 10*3/uL — ABNORMAL HIGH (ref 4.0–10.5)
nRBC: 0 % (ref 0.0–0.2)

## 2022-03-08 MED ORDER — FUROSEMIDE 10 MG/ML IJ SOLN
20.0000 mg | Freq: Two times a day (BID) | INTRAMUSCULAR | Status: AC
  Administered 2022-03-08 (×2): 20 mg via INTRAVENOUS
  Filled 2022-03-08 (×2): qty 2

## 2022-03-08 MED ORDER — SODIUM CHLORIDE 0.9 % IV SOLN
12.5000 mg | Freq: Four times a day (QID) | INTRAVENOUS | Status: AC | PRN
  Administered 2022-03-08 – 2022-03-09 (×2): 12.5 mg via INTRAVENOUS
  Filled 2022-03-08 (×2): qty 0.5

## 2022-03-08 MED ORDER — INSULIN ASPART 100 UNIT/ML IJ SOLN
0.0000 [IU] | Freq: Three times a day (TID) | INTRAMUSCULAR | Status: DC
  Administered 2022-03-13: 5 [IU] via SUBCUTANEOUS

## 2022-03-08 MED ORDER — METOCLOPRAMIDE HCL 5 MG/ML IJ SOLN
10.0000 mg | Freq: Four times a day (QID) | INTRAMUSCULAR | Status: AC
Start: 2022-03-08 — End: 2022-03-10
  Administered 2022-03-08 – 2022-03-10 (×7): 10 mg via INTRAVENOUS
  Filled 2022-03-08 (×7): qty 2

## 2022-03-08 MED FILL — Potassium Chloride Inj 2 mEq/ML: INTRAVENOUS | Qty: 40 | Status: AC

## 2022-03-08 MED FILL — Mannitol IV Soln 20%: INTRAVENOUS | Qty: 500 | Status: AC

## 2022-03-08 MED FILL — Heparin Sodium (Porcine) Inj 1000 Unit/ML: INTRAMUSCULAR | Qty: 10 | Status: AC

## 2022-03-08 MED FILL — Heparin Sodium (Porcine) Inj 1000 Unit/ML: Qty: 1000 | Status: AC

## 2022-03-08 MED FILL — Sodium Bicarbonate IV Soln 8.4%: INTRAVENOUS | Qty: 50 | Status: AC

## 2022-03-08 MED FILL — Sodium Chloride IV Soln 0.9%: INTRAVENOUS | Qty: 2000 | Status: AC

## 2022-03-08 MED FILL — Electrolyte-R (PH 7.4) Solution: INTRAVENOUS | Qty: 6000 | Status: AC

## 2022-03-08 MED FILL — Lidocaine HCl Local Preservative Free (PF) Inj 2%: INTRAMUSCULAR | Qty: 15 | Status: AC

## 2022-03-08 MED FILL — Albumin, Human Inj 5%: INTRAVENOUS | Qty: 250 | Status: AC

## 2022-03-08 NOTE — Progress Notes (Signed)
CT surgery PM rounds  Patient with less surgical pain today but persistent nausea despite Reglan and Zofran.  Will dose with Phenergan x1. Hemodynamics stable in a paced rhythm blood pressure stable  Blood pressure 93/73, pulse 60, temperature 97.7 F (36.5 C), temperature source Oral, resp. rate 14, height '5\' 4"'$  (1.626 m), weight 74.5 kg, SpO2 93 %.

## 2022-03-08 NOTE — Progress Notes (Addendum)
TCTS DAILY ICU PROGRESS NOTE                   [address]            [place] [address]          [phone]   2 Days Post-Op Procedure(s) (LRB): RESECTION OF AORTIC VALVE TUMOR (N/A) MAZE (N/A) CLIPPING OF ATRIAL APPENDAGE USING ATRICURE  CLIP SIZE 45MM (N/A) TRANSESOPHAGEAL ECHOCARDIOGRAM (TEE) (N/A)  Total Length of Stay:  LOS: 2 days   Subjective: Awake and alert, walked 1 lap in the unit this morning.  Continues to have nausea with a few episodes of vomiting. Passing gas and denies abd pain.   Objective: Vital signs in last 24 hours: Temp:  [97.5 F (36.4 C)-98.1 F (36.7 C)] 97.7 F (36.5 C) (06/20 2300) Pulse Rate:  [88] 88 (06/21 0700) Cardiac Rhythm: Atrial paced (06/21 0400) Resp:  [11-15] 12 (06/21 0700) BP: (93-115)/(66-93) 93/73 (06/21 0700) SpO2:  [90 %-100 %] 96 % (06/21 0700) Arterial Line BP: (96-134)/(53-78) 115/67 (06/21 0500) Weight:  [74.5 kg] 74.5 kg (06/21 0632)  Filed Weights   03/06/22 0541 03/07/22 0546 03/08/22 2694  Weight: 65.3 kg 71.7 kg 74.5 kg    Weight change: 2.832 kg   Hemodynamic parameters for last 24 hours: PAP: (28-33)/(17-21) 29/18  Intake/Output from previous day: 06/20 0701 - 06/21 0700 In: 1242.6 [P.O.:220; I.V.:650.6; IV Piggyback:372] Out: 8546 [Urine:1155; Chest Tube:520]  Intake/Output this shift: No intake/output data recorded.  Current Meds: Scheduled Meds:  acetaminophen  1,000 mg Oral Q6H   Or   acetaminophen (TYLENOL) oral liquid 160 mg/5 mL  1,000 mg Per Tube Q6H   amiodarone  200 mg Oral Daily   aspirin EC  81 mg Oral Daily   atorvastatin  40 mg Oral QHS   bisacodyl  10 mg Oral Daily   Or   bisacodyl  10 mg Rectal Daily   Chlorhexidine Gluconate Cloth  6 each Topical Daily   docusate sodium  200 mg Oral Daily   enoxaparin (LOVENOX) injection  40 mg Subcutaneous QHS   escitalopram  10 mg Oral Daily   insulin aspart  0-24 Units Subcutaneous Q4H   insulin detemir  10 Units  Subcutaneous BID   midodrine  10 mg Oral BID WC   pantoprazole  40 mg Oral Daily   sodium chloride flush  3 mL Intravenous Q12H   Continuous Infusions:  sodium chloride 10 mL/hr at 03/06/22 1245   sodium chloride Stopped (03/07/22 0700)   sodium chloride     insulin 0.8 mL/hr at 03/07/22 2300   lactated ringers     lactated ringers Stopped (03/06/22 1724)   lactated ringers Stopped (03/07/22 0433)   nitroGLYCERIN 5 mcg/min (03/06/22 1213)   norepinephrine (LEVOPHED) Adult infusion Stopped (03/07/22 1702)   phenylephrine (NEO-SYNEPHRINE) Adult infusion 35 mcg/min (03/07/22 2300)   PRN Meds:.sodium chloride, ALPRAZolam, dextrose, fentaNYL (SUBLIMAZE) injection, lactated ringers, metoprolol tartrate, midazolam, ondansetron (ZOFRAN) IV, oxyCODONE, sodium chloride flush, traMADol  General appearance: alert, cooperative, and mild distress Neurologic: intact Heart: JR at 45-50/min,  currently A-paced at 59 with good capture Lungs: breath sounds clear anterior, small air leak from CT. Insertion site and connections OK. Abdomen: soft, NT, rare bowel sounds Extremities: warm Wound: covered with a dry dressing  Lab Results: CBC: Recent Labs    03/07/22 1612 03/08/22 0434  WBC 19.4* 16.1*  HGB 9.0* 8.8*  HCT 27.9* 26.6*  PLT 140* 119*  BMET:  Recent Labs    03/07/22 1612 03/08/22 0434  NA 138 137  K 4.4 4.4  CL 108 109  CO2 21* 23  GLUCOSE 151* 100*  BUN 11 12  CREATININE 0.80 0.61  CALCIUM 8.4* 8.6*     CMET: Lab Results  Component Value Date   WBC 16.1 (H) 03/08/2022   HGB 8.8 (L) 03/08/2022   HCT 26.6 (L) 03/08/2022   PLT 119 (L) 03/08/2022   GLUCOSE 100 (H) 03/08/2022   CHOL 128 01/18/2022   TRIG 65 01/18/2022   HDL 50 01/18/2022   LDLCALC 64 01/18/2022   ALT 26 03/03/2022   AST 36 03/03/2022   NA 137 03/08/2022   K 4.4 03/08/2022   CL 109 03/08/2022   CREATININE 0.61 03/08/2022   BUN 12 03/08/2022   CO2 23 03/08/2022   TSH 2.226 07/30/2019   INR  1.5 (H) 03/06/2022   HGBA1C 5.2 03/02/2022      PT/INR:  Recent Labs    03/06/22 1240  LABPROT 18.3*  INR 1.5*    Radiology: No results found.   Assessment/Plan: S/P Procedure(s) (LRB): RESECTION OF AORTIC VALVE TUMOR (N/A) MAZE (N/A) CLIPPING OF ATRIAL APPENDAGE USING ATRICURE  CLIP SIZE 45MM (N/A) TRANSESOPHAGEAL ECHOCARDIOGRAM (TEE) (N/A)  -Post-op day 2 valve-sparing resection of aortic valve tumor . Stable VS off milrinone. CT drained 530m past 24 hours, small air leak noted.  Midodrine resumed, BP ~110-1226mg.  -H/O persistent atrial fibrillation- s/p MAZE and left atrial clip- currently in JR, A-pacing with good conduction. Amiodarone '200mg'$  daily resumed post-op.  On no BB. Pan to resume Eliquis prior to discharge.  -GI- nausea with vomiting. Abd exam remains benign. Continue PPI, and Reglan. Avoid narcotics.  -HEME- expected acute blood loss anemia-drainage tapering off. Monitor.  -RENAL- normal renal function. Wt  increased again yesterday despite negative I&O.  Continue Lasix.  -NEURO- intact, h/o CVA with minimal residual deficit. Advance activity as able.   -DVT-PPX- continue enoxaparin     MyAntony OdeaPA-C [phone]/21/2023 7:51 AM  Patient seen and examined, agree with above  Junctional 45-50, AP at 60 Nausea limiting factor currently Minimal air leak form CT- will place to water seal and observe Ambulated this AM  StRemo Lipps. HeRoxan HockeyMD [facility] [phone]

## 2022-03-08 NOTE — Anesthesia Postprocedure Evaluation (Signed)
Anesthesia Post Note  Patient: Hannah Zimmerman  Procedure(s) Performed: RESECTION OF AORTIC VALVE TUMOR MAZE CLIPPING OF ATRIAL APPENDAGE USING ATRICURE  CLIP SIZE 45MM TRANSESOPHAGEAL ECHOCARDIOGRAM (TEE)     Patient location during evaluation: ICU Anesthesia Type: General Level of consciousness: sedated Pain management: pain level controlled Vital Signs Assessment: post-procedure vital signs reviewed and stable Respiratory status: respiratory function stable and patient remains intubated per anesthesia plan Cardiovascular status: blood pressure returned to baseline and stable Postop Assessment: no apparent nausea or vomiting Anesthetic complications: no   No notable events documented.  Last Vitals:  Vitals:   03/08/22 1600 03/08/22 1612  BP:    Pulse: 60   Resp: 14   Temp:  36.5 C  SpO2: 93%     Last Pain:  Vitals:   03/08/22 1612  TempSrc: Oral  PainSc:                  Ellen Goris

## 2022-03-09 ENCOUNTER — Inpatient Hospital Stay (HOSPITAL_COMMUNITY): Payer: Commercial Managed Care - HMO

## 2022-03-09 LAB — BASIC METABOLIC PANEL
Anion gap: 5 (ref 5–15)
BUN: 21 mg/dL — ABNORMAL HIGH (ref 6–20)
CO2: 24 mmol/L (ref 22–32)
Calcium: 8.5 mg/dL — ABNORMAL LOW (ref 8.9–10.3)
Chloride: 107 mmol/L (ref 98–111)
Creatinine, Ser: 1.12 mg/dL — ABNORMAL HIGH (ref 0.44–1.00)
GFR, Estimated: 60 mL/min — ABNORMAL LOW (ref >60)
Glucose, Bld: 110 mg/dL — ABNORMAL HIGH (ref 70–99)
Potassium: 4.1 mmol/L (ref 3.5–5.1)
Sodium: 136 mmol/L (ref 135–145)

## 2022-03-09 LAB — CBC
HCT: 26.6 % — ABNORMAL LOW (ref 36.0–46.0)
Hemoglobin: 9.1 g/dL — ABNORMAL LOW (ref 12.0–15.0)
MCH: 35.3 pg — ABNORMAL HIGH (ref 26.0–34.0)
MCHC: 34.2 g/dL (ref 30.0–36.0)
MCV: 103.1 fL — ABNORMAL HIGH (ref 80.0–100.0)
Platelets: 134 10*3/uL — ABNORMAL LOW (ref 150–400)
RBC: 2.58 MIL/uL — ABNORMAL LOW (ref 3.87–5.11)
RDW: 15.7 % — ABNORMAL HIGH (ref 11.5–15.5)
WBC: 16.6 10*3/uL — ABNORMAL HIGH (ref 4.0–10.5)
nRBC: 0 % (ref 0.0–0.2)

## 2022-03-09 LAB — GLUCOSE, CAPILLARY
Glucose-Capillary: 108 mg/dL — ABNORMAL HIGH (ref 70–99)
Glucose-Capillary: 87 mg/dL (ref 70–99)
Glucose-Capillary: 94 mg/dL (ref 70–99)
Glucose-Capillary: 114 mg/dL — ABNORMAL HIGH (ref 70–99)
Glucose-Capillary: 67 mg/dL — ABNORMAL LOW (ref 70–99)

## 2022-03-09 MED ORDER — SODIUM CHLORIDE 0.9% FLUSH
10.0000 mL | Freq: Two times a day (BID) | INTRAVENOUS | Status: DC
  Administered 2022-03-09 – 2022-03-13 (×8): 10 mL

## 2022-03-09 NOTE — Progress Notes (Signed)
      [address]       [place] [address]             [phone]      POD # 3  Nausea a little better, poor appetite Doesn't like hospital food  BP (!) 92/58   Pulse (!) 54   Temp 97.7 F (36.5 C) (Oral)   Resp 17   Ht '5\' 4"'$  (1.626 m)   Wt 73.7 kg   SpO2 96%   BMI 27.89 kg/m   Intake/Output Summary (Last 24 hours) at 03/09/2022 1733 Last data filed at 03/09/2022 1300 Gross per 24 hour  Intake 623.68 ml  Output 370 ml  Net 253.68 ml   Slow progress due to nausea, but slowly improving  Remo Lipps C. Roxan Hockey, MD [facility] [phone]

## 2022-03-10 ENCOUNTER — Inpatient Hospital Stay (HOSPITAL_COMMUNITY): Payer: Commercial Managed Care - HMO

## 2022-03-10 DIAGNOSIS — R001 Bradycardia, unspecified: Secondary | ICD-10-CM | POA: Diagnosis not present

## 2022-03-10 DIAGNOSIS — Z9889 Other specified postprocedural states: Secondary | ICD-10-CM | POA: Diagnosis not present

## 2022-03-10 DIAGNOSIS — Z8679 Personal history of other diseases of the circulatory system: Secondary | ICD-10-CM

## 2022-03-10 LAB — BASIC METABOLIC PANEL
Anion gap: 9 (ref 5–15)
BUN: 34 mg/dL — ABNORMAL HIGH (ref 6–20)
CO2: 23 mmol/L (ref 22–32)
Calcium: 8.7 mg/dL — ABNORMAL LOW (ref 8.9–10.3)
Chloride: 107 mmol/L (ref 98–111)
Creatinine, Ser: 1.39 mg/dL — ABNORMAL HIGH (ref 0.44–1.00)
GFR, Estimated: 46 mL/min — ABNORMAL LOW (ref >60)
Glucose, Bld: 97 mg/dL (ref 70–99)
Potassium: 4.4 mmol/L (ref 3.5–5.1)
Sodium: 139 mmol/L (ref 135–145)

## 2022-03-10 LAB — CBC
HCT: 26 % — ABNORMAL LOW (ref 36.0–46.0)
Hemoglobin: 8.7 g/dL — ABNORMAL LOW (ref 12.0–15.0)
MCH: 34.8 pg — ABNORMAL HIGH (ref 26.0–34.0)
MCHC: 33.5 g/dL (ref 30.0–36.0)
MCV: 104 fL — ABNORMAL HIGH (ref 80.0–100.0)
Platelets: 148 10*3/uL — ABNORMAL LOW (ref 150–400)
RBC: 2.5 MIL/uL — ABNORMAL LOW (ref 3.87–5.11)
RDW: 15.8 % — ABNORMAL HIGH (ref 11.5–15.5)
WBC: 13.9 10*3/uL — ABNORMAL HIGH (ref 4.0–10.5)
nRBC: 0.3 % — ABNORMAL HIGH (ref 0.0–0.2)

## 2022-03-10 LAB — GLUCOSE, CAPILLARY
Glucose-Capillary: 92 mg/dL (ref 70–99)
Glucose-Capillary: 110 mg/dL — ABNORMAL HIGH (ref 70–99)
Glucose-Capillary: 90 mg/dL (ref 70–99)
Glucose-Capillary: 82 mg/dL (ref 70–99)

## 2022-03-10 MED ORDER — ALBUMIN HUMAN 5 % IV SOLN
25.0000 g | Freq: Once | INTRAVENOUS | Status: AC
Start: 2022-03-10 — End: 2022-03-10
  Administered 2022-03-10: 25 g via INTRAVENOUS
  Filled 2022-03-10: qty 500

## 2022-03-10 MED ORDER — SODIUM CHLORIDE 0.9 % IV SOLN: INTRAVENOUS | Status: AC

## 2022-03-10 NOTE — Consult Note (Addendum)
Cardiology Consultation:   Patient ID: Hannah Zimmerman MRN: [id number]; DOB: 1971/05/27  Admit date: 03/06/2022 Date of Consult: 03/10/2022  PCP:  Wilford Corner, PA-C   [facility] Providers Cardiologist:  Julien Nordmann, MD  Electrophysiologist:  Regan Lemming, MD  {   Patient Profile:   Hannah Zimmerman is a 51 y.o. female with a hx of  HFpEF, morbid obesity 2/p gastric bypass, persistent atrial fibrillation, and tobacco abuse, HCM, stroke who is being seen 03/10/2022 for the evaluation of post op sinus node dysfunction at the request of Dr. Dorris Fetch.  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 And planned for surgical intervention.  She was admitted 03/06/22 and underwent  RESECTION OF AORTIC VALVE TUMOR  2.   MAZE PROCEDURE 3.   CLIPPING OF ATRIAL APPENDAGE    Home amiodarone resumed given her hx of Afib Requiring APacing with intact AV conduction Extubated quickly and ambulating POD #2 Getting midodrine Poor appetite with nausea Today POD #4 she remains in a junctional rhythm and EP is asked on board  LABS K+ 4.4 BUN/Creat 34/1.39 (baseline 0.7) WBC 13.9 (trending down post op) H/H 8.7/26 Plts 148  Cxr with no significant residual PTX There is persistent pneumomediastinum Small R pleural effusion, underlying edema  TODAY She is very tired, weak, (has been since OR), nurse reports significant nausea and poor oral intake. Finally eating some today. No CP, no SOB   Past Medical History:  Diagnosis Date   (HFpEF) heart failure with preserved ejection fraction (HCC)    a. 07/2019 Echo: EF 60-65%, mod LVH.  Sev dil LA, mildly dil RA. Mod elev PASP; b. 07/2020 TEE: EF 50-55%, no rwma, sev conc LVH. Nl RV fxn. Mod dil RA. Mild-mod MR, mod TR.   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 07/2020 Cardiac CT: Cor Ca2+ = 338 (99th %'ile).   Complication of anesthesia 20 years ago   a. spinal with first c section went too high stopped breathing, low bp with 2nd c section, no further issues with anesthesia   Heart murmur    a. 07/2019 Echo: no significant valvular dzs; b. 07/2020 TEE: Mild-mod MR, mod TR.   History of hiatal hernia    Hypertrophic cardiomyopathy (HCC)    a. 07/2019 Echo: Mod LVH; b. 01/2020 cMRI: EF 67%, HCM w/o obstruction. Max wall thickness 19mm. Sev LAE w/ L->R atrial level shunt. Large area of LGE @ mid-ventricular level in area of max wall thickness; c. 07/2020 Cardiac CT: Asymm hypertrophy up to 19mm in mid inferoseptum consistent w/ known HCM.   Morbid obesity (HCC)    a. 09/2014 s/p gastric bypass.   Persistent atrial fibrillation (HCC)    a. Dx 07/2019-->CHA2DS2VASc = 2 (diast CHF/Fem)-->Eliquis; b. 09/2018 s/p DCCV (150J (biphasic) x 1); c. 12/2019 RFCA/PVI; d. 07/2020 repeat RFCA/PVI.   Rash    both arms from old bed bug bites, healing   Stroke [facility])    Tobacco abuse    Typical atrial flutter (HCC)    a. 12/2019 s/p RFCA.    Past Surgical History:  Procedure Laterality  Date   ABDOMINAL HYSTERECTOMY     ANKLE SURGERY     left x 2   ATRIAL FIBRILLATION ABLATION N/A 12/24/2019   Procedure: ATRIAL FIBRILLATION ABLATION;  Surgeon: Regan Lemming, MD;  Location: [facility] INV[facility]IVE CV LAB;  Service: Cardiovascular;  Laterality: N/A;   ATRIAL FIBRILLATION ABLATION N/A 08/11/2020   Procedure: ATRIAL FIBRILLATION ABLATION;  Surgeon: Regan Lemming, MD;  Location: [facility]VAS[facility] CV LAB;  Service: Cardiovascular;  Laterality: N/A;   CARDIOVERSION N/A 09/24/2019   Procedure: CARDIOVERSION;  Surgeon: Antonieta Iba, MD;  Location: [facility] ORS;  Service:  Cardiovascular;  Laterality: N/A;   CARDIOVERSION N/A 10/17/2019   Procedure: CARDIOVERSION;  Surgeon: Antonieta Iba, MD;  Location: [facility] ORS;  Service: Cardiovascular;  Laterality: N/A;   CARDIOVERSION N/A 11/08/2021   Procedure: CARDIOVERSION;  Surgeon: Antonieta Iba, MD;  Location: [facility] ORS;  Service: Cardiovascular;  Laterality: N/A;   CESAREAN SECTION     x 3   CLIPPING OF ATRIAL APPENDAGE N/A 03/06/2022   Procedure: CLIPPING OF ATRIAL APPENDAGE USING ATRICURE  CLIP SIZE ;  Surgeon: Loreli Slot, MD;  Location: [facility] OR;  Service: Open Heart Surgery;  Laterality: N/A;   EXCISION OF ATRIAL MYXOMA N/A 03/06/2022   Procedure: RESECTION OF AORTIC VALVE TUMOR;  Surgeon: Loreli Slot, MD;  Location: [facility] OR;  Service: Open Heart Surgery;  Laterality: N/A;   HERNIA REPAIR     x2 one was for sure a hiatal hernia   IR ANGIO EXTRACRAN SEL COM CAROTID INNOMINATE UNI L MOD SED  11/13/2021   IR CT HEAD LTD  11/13/2021   IR PERCUTANEOUS ART THROMBECTOMY/INFUSION INTRACRANIAL INC DIAG ANGIO  11/13/2021   LAPAROSCOPIC GASTRIC SLEEVE RESECTION WITH HIATAL HERNIA REPAIR  08/17/2015   Procedure: LAPAROSCOPIC GASTRIC SLEEVE RESECTION WITH HIATAL HERNIA REPAIR;  Surgeon: Luretha Murphy, MD;  Location: [facility] ORS;  Service: General;;   MAZE N/A 03/06/2022   Procedure: MAZE;  Surgeon: Loreli Slot, MD;  Location[facility] OR;  Service: Open Heart Surgery;  Laterality: N/A;   RADIOLOGY WITH ANESTHESIA N/A 11/12/2021   Procedure: IR WITH ANESTHESIA;  Surgeon: Julieanne Cotton, MD;  Location: [facility] OR;  Service: Radiology;  Laterality: N/A;   TEE WITHOUT CARDIOVERSION N/A 08/11/2020   Procedure: TRANSESOPHAGEAL ECHOCARDIOGRAM (TEE);  Surgeon: Regan Lemming, MD;  Location: [facility] INVASIVE CV LAB;  Service: Cardiovascular;  Laterality: N/A;   TEE WITHOUT CARDIOVERSION N/A 11/08/2021   Procedure: TRANSESOPHAGEAL ECHOCARDIOGRAM (TEE);  Surgeon: Antonieta Iba, MD;  Location: [facility] ORS;   Service: Cardiovascular;  Laterality: N/A;   TEE WITHOUT CARDIOVERSION N/A 03/06/2022   Procedure: TRANSESOPHAGEAL ECHOCARDIOGRAM (TEE);  Surgeon: Loreli Slot, MD;  Location: [facility] OR;  Service: Open Heart Surgery;  Laterality: N/A;   TUBAL LIGATION     x 2   UPPER GI ENDOSCOPY  08/17/2015   Procedure: UPPER GI ENDOSCOPY;  Surgeon: Luretha Murphy, MD;  Location: [facility] ORS;  Service: General;;     Home Medications:  Prior to Admission medications   Medication Sig Start Date End Date Taking? Authorizing Provider  acetaminophen (TYLENOL) 500 MG tablet Take 1,000 mg by mouth every 6 (six) hours as needed (pain/headaches.).   Yes [provider]  ALPRAZolam (XANAX) 0.25 MG tablet Take 0.125 mg by mouth daily as needed for anxiety.   Yes [provider]  amiodarone (PACERONE) 200 MG tablet Take 1 tablet (200 mg total) by mouth daily. 12/15/21  Yes Fenton, Clint R, PA  Aspirin-Caffeine (BC  FAST PAIN RELIEF PO) Take 1 packet by mouth daily as needed (pain).   Yes [provider]  atorvastatin (LIPITOR) 40 MG tablet Take 1 tablet (40 mg total) by mouth at bedtime. 12/19/21  Yes Eino Whitner, Andree Coss, MD  b complex vitamins capsule Take 1 capsule by mouth daily.   Yes [provider]  Cholecalciferol (VITAMIN D3 PO) Take 1 tablet by mouth in the morning.   Yes [provider]  ELIQUIS 5 MG TABS tablet TAKE 1 TABLET(5 MG) BY MOUTH TWICE DAILY 01/23/22  Yes Naftoli Penny Daphine Deutscher, MD  escitalopram (LEXAPRO) 10 MG tablet Take 10 mg by mouth daily.   Yes [provider]  midodrine (PROAMATINE) 5 MG tablet Take 3 tablets (15 mg total) by mouth 3 (three) times daily. Patient taking differently: Take 10 mg by mouth in the morning and at bedtime. 12/19/21  Yes Amrit Erck, Andree Coss, MD    Inpatient Medications: Scheduled Meds:  acetaminophen  1,000 mg Oral Q6H   Or   acetaminophen (TYLENOL) oral liquid 160 mg/5 mL  1,000 mg Per Tube Q6H   aspirin EC  81 mg  Oral Daily   atorvastatin  40 mg Oral QHS   bisacodyl  10 mg Oral Daily   Or   bisacodyl  10 mg Rectal Daily   Chlorhexidine Gluconate Cloth  6 each Topical Daily   docusate sodium  200 mg Oral Daily   enoxaparin (LOVENOX) injection  40 mg Subcutaneous QHS   escitalopram  10 mg Oral Daily   insulin aspart  0-15 Units Subcutaneous TID WC   metoCLOPramide (REGLAN) injection  10 mg Intravenous Q6H   midodrine  10 mg Oral BID WC   pantoprazole  40 mg Oral Daily   sodium chloride flush  10-40 mL Intracatheter Q12H   sodium chloride flush  3 mL Intravenous Q12H   Continuous Infusions:  sodium chloride 10 mL/hr at 03/06/22 1245   sodium chloride Stopped (03/07/22 0700)   sodium chloride 20 mL/hr at 03/09/22 1350   sodium chloride 100 mL/hr at 03/10/22 0805   lactated ringers Stopped (03/06/22 1724)   lactated ringers Stopped (03/08/22 1926)   phenylephrine (NEO-SYNEPHRINE) Adult infusion 35 mcg/min (03/07/22 2300)   PRN Meds: sodium chloride, ALPRAZolam, metoprolol tartrate, ondansetron (ZOFRAN) IV, oxyCODONE, sodium chloride flush, traMADol  Allergies:    Allergies  Allergen Reactions   Codeine Hives        Hydrocodone Hives    Social History:   Social History   Socioeconomic History   Marital status: Significant Other    Spouse name: Not on file   Number of children: 3   Years of education: two years of college   Highest education level: Not on file  Occupational History   Occupation: office assistant  Tobacco Use   Smoking status: Every Day    Packs/day: 0.25    Years: 31.00    Total pack years: 7.75    Types: Cigarettes    Passive exposure: Current   Smokeless tobacco: Never   Tobacco comments:    5 cigarettes daily   Vaping Use   Vaping Use: Never used  Substance and Sexual Activity   Alcohol use: Yes    Alcohol/week: 1.0 - 2.0 standard drink of alcohol    Types: 1 - 2 Standard drinks or equivalent per week    Comment: Occaional use - 1-2 mixed drinks  prior to stroke since d/c nothing 11/30/21   Drug use: No   Sexual activity:  Yes  Other Topics Concern   Not on file  Social History Narrative   Lives at home with significant other.   Some caffeine daily I her green tea.   Right-handed.   Social Determinants of Health   Financial Resource Strain: Low Risk  (08/22/2019)   Overall Financial Resource Strain (CARDIA)    Difficulty of Paying Living Expenses: Not hard at all  Food Insecurity: No Food Insecurity (08/22/2019)   Hunger Vital Sign    Worried About Running Out of Food in the Last Year: Never true    Ran Out of Food in the Last Year: Never true  Transportation Needs: No Transportation Needs (11/29/2021)   PRAPARE - Administrator, Civil Service (Medical): No    Lack of Transportation (Non-Medical): No  Physical Activity: Sufficiently Active (08/22/2019)   Exercise Vital Sign    Days of Exercise per Week: 7 days    Minutes of Exercise per Session: 150+ min  Stress: No Stress Concern Present (08/22/2019)   [employer] of Occupational Health - Occupational Stress Questionnaire    Feeling of Stress : Only a little  Social Connections: Moderately Isolated (08/22/2019)   Social Connection and Isolation Panel [NHANES]    Frequency of Communication with Friends and Family: More than three times a week    Frequency of Social Gatherings with Friends and Family: More than three times a week    Attends Religious Services: 1 to 4 times per year    Active Member of [employer] or Organizations: No    Attends Banker Meetings: Never    Marital Status: Separated  Intimate Partner Violence: Not At Risk (08/22/2019)   Humiliation, Afraid, Rape, and Kick questionnaire    Fear of Current or Ex-Partner: No    Emotionally Abused: No    Physically Abused: No    Sexually Abused: No    Family History:   Family History  Problem Relation Age of Onset   Hypertension Mother    Diabetes Mother    Heart failure Mother       ROS:  Please see the history of present illness.  All other ROS reviewed and negative.     Physical Exam/Data:   Vitals:   03/10/22 0605 03/10/22 0635 03/10/22 0700 03/10/22 0800  BP: 90/75 103/64 90/79 98/64   Pulse: 62 (!) 56 (!) 51 (!) 50  Resp: 19 18 16 16   Temp:      TempSrc:      SpO2: 97% 97% 93% 95%  Weight:      Height:        Intake/Output Summary (Last 24 hours) at 03/10/2022 0911 Last data filed at 03/10/2022 0800 Gross per 24 hour  Intake 440 ml  Output 200 ml  Net 240 ml      03/10/2022    4:00 AM 03/09/2022    7:00 AM 03/08/2022    6:32 AM  Last 3 Weights  Weight (lbs) 162 lb 0.6 oz 162 lb 7.7 oz 164 lb 3.9 oz  Weight (kg) 73.5 kg 73.7 kg 74.5 kg     Body mass index is 27.81 kg/m.  General:  Well nourished, well developed, in no acute distress, appears older then her age HEENT: normal Neck: no JVD Vascular: No carotid bruits; Distal pulses 2+ bilaterally Cardiac:  RRR; no murmurs, gallops or rubs Thoracotomy is CDI Lungs:  CTA b/l, no wheezing, rhonchi or rales  Abd: soft, nontender  Ext: no edema Musculoskeletal:  No deformities, advanced  atrophy for age Skin: warm and dry  Neuro:  no gross focal motor abnormalities noted Psych:  Normal affect   EKG:  The EKG was personally reviewed and demonstrates:    Junctional rhythm 50bpm, QRS 86ms  OLD 03/02/22 SRB 54 1st degree AV block , QRS 90ms  Telemetry:  Telemetry was personally reviewed and demonstrates:   V pacing currently Threshold is 2mA Underlying rhythm is junctional 40's >> 50s once awake and moving  Relevant CV Studies:   11/13/21: TTE  1. Left ventricular ejection fraction, by estimation, is 60 to 65%. The  left ventricle has normal function. The left ventricle has no regional  wall motion abnormalities. There is severe concentric left ventricular  hypertrophy. Indeterminate diastolic  filling due to E-A fusion. Elevated left ventricular end-diastolic  pressure.   2. Right  ventricular systolic function is normal. The right ventricular  size is moderately enlarged.   3. Right atrial size was moderately dilated.   4. Left atrial size was severely dilated.   5. The mitral valve is normal in structure. Mild mitral valve  regurgitation. No evidence of mitral stenosis.   6. There is a soft tissue density on the noncoronary cusp extending into  the LVOT. 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 Differential dx includes  endocarditis (in right clinical setting) or valvular myxmoa. Images from  TEE done 11/08/21 demonstrate the   same soft tissue density (images 34-37 and 42 on TEE). There is also a  prominent nodule of Arantius on the [facility] that is normal finding.   7. Recommend cMRI for further tissue evaluation of the aortic valve mass.      11/08/21: TEE  1. Left ventricular ejection fraction, by estimation, is 50-55 %. The  left ventricle has normal function. The left ventricle has no regional  wall motion abnormalities. There is mild concentric left ventricular  hypertrophy.   2. Right ventricular systolic function is normal. The right ventricular  size is normal.   3. Left atrial size was severely dilated. No left atrial/left atrial  appendage thrombus was detected.   4. Right atrial size was moderately dilated.   5. The mitral valve is normal in structure. No evidence of mitral valve  regurgitation. No evidence of mitral stenosis.   6. Tricuspid valve regurgitation is moderate.   7. The aortic valve is normal in structure. Aortic valve regurgitation is  not visualized. Aortic valve sclerosis is present, with no evidence of  aortic valve stenosis.   8. The inferior vena cava is normal in size with greater than 50%  respiratory variability, suggesting right atrial pressure of 3 mmHg.   9. Agitated saline contrast bubble study was negative, with no evidence  of any interatrial shunt.   Conclusion(s)/Recommendation(s): Normal biventricular function without   evidence of hemodynamically significant valvular heart disease.      Cardiac MRI 01/28/2020 Review of the above records today demonstrates:  1.  Normal LV chamber size, with hyperdynamic function, LVEF 67%. 2.  Normal RV chamber size, RVEF 70%. 3. 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 Maximal wall thickness at the mid ventricular level is 19 mm. 4. Large area of dense delayed myocardial enhancement at the mid ventricular level in the area of maximal wall thickness. Visually appears > 15% of the myocardial mass. 5. Severe left atrial enlargement. Iatrogenic left to right atrial level shunt. 6.  No findings to suggests cardiac amyloidosis.  ECV 22%. Laboratory Data:  High Sensitivity Troponin:  No results  for input(s): "TROPONINIHS" in the last 720 hours.   Chemistry Recent Labs  Lab 03/06/22 1743 03/06/22 1937 03/07/22 0326 03/07/22 1612 03/08/22 0434 03/09/22 0437 03/10/22 0428  NA 140   < > 139   < > 137 136 139  K 4.0   < > 4.0   < > 4.4 4.1 4.4  CL 111   < > 111   < > 109 107 107  CO2 21*   < > 22   < > 23 24 23   GLUCOSE 134*   < > 125*   < > 100* 110* 97  BUN 8   < > 9   < > 12 21* 34*  CREATININE 0.74   < > 0.71   < > 0.61 1.12* 1.39*  CALCIUM 8.0*   < > 8.1*   < > 8.6* 8.5* 8.7*  MG 3.2*  --  2.7*  --   --   --   --   GFRNONAA >60   < > >60   < > >60 60* 46*  ANIONGAP 8   < > 6   < > 5 5 9    < > = values in this interval not displayed.    Recent Labs  Lab 03/03/22 1811  PROT 6.9  ALBUMIN 4.3  AST 36  ALT 26  ALKPHOS 85  BILITOT 1.0   Lipids No results for input(s): "CHOL", "TRIG", "HDL", "LABVLDL", "LDLCALC", "CHOLHDL" in the last 168 hours.  Hematology Recent Labs  Lab 03/08/22 0434 03/09/22 0437 03/10/22 0428  WBC 16.1* 16.6* 13.9*  RBC 2.55* 2.58* 2.50*  HGB 8.8* 9.1* 8.7*  HCT 26.6* 26.6* 26.0*  MCV 104.3* 103.1* 104.0*  MCH 34.5* 35.3* 34.8*  MCHC 33.1 34.2 33.5  RDW 15.6* 15.7* 15.8*   PLT 119* 134* 148*   Thyroid No results for input(s): "TSH", "FREET4" in the last 168 hours.  BNPNo results for input(s): "BNP", "PROBNP" in the last 168 hours.  DDimer No results for input(s): "DDIMER" in the last 168 hours.   Radiology/Studies:  DG Chest Port 1 View Result Date: 03/10/2022 CLINICAL DATA:  [id number] post Maze 3 EXAM: PORTABLE CHEST 1 VIEW COMPARISON:  Chest radiograph dated March 09, 2022 FINDINGS: The cardiomediastinal silhouette is unchanged and enlarged in contour.RIGHT IJ CVC sheath tip terminates over the SVC. Status post median sternotomy and atrial appendage clip placement. Likely small RIGHT pleural effusion. There is pneumomediastinum, similar to minimally increased in comparison to prior. No residual pneumothorax is identified. Bibasilar opacities, likely atelectasis. Central vascular fullness and haziness, possibly accentuated by technique. Visualized abdomen is unremarkable. IMPRESSION: 1. No significant residual pneumothorax is identified. There is persistent pneumomediastinum. 2. Favored small RIGHT pleural effusion. 3. Central vascular fullness and haziness may reflect a degree of underlying pulmonary edema. Electronically Signed   By: Meda Klinefelter M.D.   On: 03/10/2022 07:54     Assessment and Plan:   P/o Sinus node dysfunction POD #4 today Amiodarone is chronic though stopped this AM, no other nodal blocking agents On Midodrine out pt (15mg  TID)  and here 10mg  BID with BPs 90's-100's  Programmed VVI 50 and pacing her intermittently When I wake her though her intrinsic junctional rate increases to 50's  She intermittently has sinus input though slower then her junctional rate. She Addisson Frate likely require PPM, though her QRS is narrow and with activity rate increases.  Dr. [name] Brogan Martis see her later today   Risk Assessment/Risk Scores:    For questions  or updates, please contact [facility] Please consult [email or web address] for contact info under     Signed, Sheilah Pigeon, PA-C  03/10/2022 9:11 AM  I have seen and examined this patient with Francis Dowse.  Agree with above, note added to reflect my findings.  Patient has a history of persistent atrial fibrillation and stroke.  She is postop day 4 from resection of aortic valve fibroblastoma, maze, left atrial appendage clipping.  Post procedure, she has had junctional rhythm.  She is currently recovering from her surgery and has no major complaints at this time.  GEN: Well nourished, well developed, in no acute distress  HEENT: normal  Neck: no JVD, carotid bruits, or masses Cardiac: RRR; no murmurs, rubs, or gallops,no edema  Respiratory:  clear to auscultation bilaterally, normal work of breathing GI: soft, nontender, nondistended, + BS MS: no deformity or atrophy  Skin: warm and dry Neuro:  Strength and sensation are intact Psych: euthymic mood, full affect   Junctional bradycardia: Likely component of sick sinus syndrome.  Fortunately her QRS complex is the same and junctional rhythm as her QRS complex is sinus rhythm.  Patient is likely a very stable rhythm.  At this point, would prefer to hold off pacemaker implant as there is still certainly the possibility that her sinus node Shaneta Cervenka begin to conduct.  I turn her pacer wires down to 40 bpm.  We Kamali Sakata have EP see her again over the weekend, and may be able to pull her temporary pacing wires at some point this weekend or early next week.  Henrietta Cieslewicz M. Davon Folta MD 03/10/2022 2:51 PM

## 2022-03-10 NOTE — Discharge Summary (Addendum)
Physician Discharge Summary  Patient ID: Hannah Zimmerman MRN: [id number] DOB/AGE: 04-08-1971 51 y.o.  Admit date: 03/06/2022 Discharge date: 03/14/2022  Admission Diagnoses:  Papillary fibroelastoma of aortic valve persistent atrial fibrillation Patient Active Problem List   Diagnosis Date Noted    03/06/2022   Secondary hypercoagulable state ([place]) 11/30/2021   Middle cerebral artery embolism, 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   Acute ischemic stroke ([place]) 11/12/2021   A-fib ([place]) 11/03/2021   Atypical atrial flutter ([facility])    Nonrheumatic tricuspid valve regurgitation    Bronchitis with acute wheezing 07/31/2019   Persistent atrial fibrillation ([place]) 07/30/2019   Acute exacerbation of CHF (congestive heart failure) ([place]) 07/30/2019   Bronchitis 07/30/2019   Transaminitis 07/30/2019   Atrial fibrillation with rapid ventricular response ([place])    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 08/17/2015   Tachycardia 06/05/2011   SOB (shortness of breath) 06/05/2011   Pulmonary HTN ([place]) 06/05/2011   Morbid obesity ([place]) 06/05/2011   Smoking 06/05/2011    Discharge Diagnoses:   Papillary fibroelastoma of aortic valve History of persistent atrial fibrillation S/P resection of aortic valve papillary fibroblastoma and maze operation for atrial fibrillation Expected acute blood loss anemia Mild postoperative renal insufficiency Postoperative sinus node dysfunction with junctional escape rhythm   Discharged Condition: stable  History of Present Illness: Hannah Zimmerman was sent for consultation regarding a papillary fibroelastoma of the aortic valve.  Hannah Zimmerman is a 51 year old woman with a past history significant for stroke, tumor of the aortic valve, persistent atrial fibrillation, pulmonary hypertension, hypertrophic cardiomyopathy without obstruction, morbid obesity status post gastric bypass surgery, and tobacco abuse.   She has had atrial fibrillation for about 4 years.  She has been on  Eliquis.  In February she had a cardioversion on Tuesday.  The following Saturday she had a stroke.  There was characterized by slurring of speech, right gaze deviation, and left-sided facial droop, neglect, and left hemiplegia.  She underwent endovascular thrombectomy.  She had recovery of most of her neurologic deficits but does still have some residual effects on speech, balance, coordination, and holding objects.   During her evaluation she had a TEE which showed a 6 mm tumor on the ventricular side of the noncoronary cusp of the aortic valve consistent with a papillary fibroelastoma  She had a stroke in February involving her right MCA.  Potential sources include atrial fibrillation with recent cardioversion and the tumor on her aortic valve.  It is possible that neither of these issues were involved.  However, I suspect it was  one or the other.   We discussed the possibility of surgical resection of the aortic valve tumor along with a Maze procedure, left atrial clipping, and closure of PFO if one is present.  I informed her of the general nature of the procedure including the incisions to be used, the use of cardiopulmonary bypass, the use of temporary pacemaker wires and drainage tubes postoperatively, the expected hospital stay, and the overall recovery.  I informed her of the indications, risks, benefits, and alternatives.  She understands the risks include, but not limited to death, MI, DVT, PE, bleeding, possible need for transfusion, infection, cardiac arrhythmias, complete heart block requiring pacemaker placement, as well as possibility of other unforeseeable complications such as renal respiratory failure or gastrointestinal complications.  She understands there is no guarantee that she would not have another stroke.  She wishes to proceed.   Hospital Course: Hannah Zimmerman was admitted for elective surgery on 03/06/2022.  She was  taken to the operating room where median sternotomy was  performed along with a MAZE procedure and clipping of the left atrial appendage.  Following the procedure, she separated from cardiopulmonary bypass without difficulty and was transferred to the surgical ICU.  Vital signs and hemodynamics remained stable.  She was weaned from the ventilator and extubated per protocol on the evening of surgery.  She continued to have a slow junctional rhythm and was paced utilizing the epicardial pacing leads and temporary pacemaker.  We observed her rhythm for the next several days and she remained in slow junctional rhythm.  The beta-blocker was held.  On the fourth postoperative day we discontinued the amiodarone which had been given a dose of 200 mg daily for known history of A-fib. She had persistent nausea for the first few days after surgery without vomiting or abdominal pain.  Her abdominal exam remained benign.  She was treated with Zofran and Reglan.  Diet was begun and slowly advanced as tolerated.  She made acceptable progress with mobility and was able to walk several laps in the ICU by the second postoperative day.  The junctional rhythm persisted with no observed recovery of sinus node function.  We requested evaluation by the EP team.  EKG was obtained demonstrating a relatively narrow QRS complex similar to that seen when she was in sinus rhythm.  Dr. Curt Bears felt she likely had a component of sick sinus syndrome.  He felt this was a stable rhythm and recommended continued observation.  The pacemaker rate was decreased to 40 bpm.  She continued to be observed in the ICU for the next few days.  Dr. Lovena Le and Dr. Curt Bears did not junctional rhythm persisted but feel that a permanent pacemaker was indicated.  He recommended stopping the amiodarone which was done.  The junctional rhythm persisted but the rate improved to high 60s to around 70/min.  She tolerated this well.  Zio patch monitoring after discharge was considered by the EP team but was not felt to be  necessary. Hannah Zimmerman otherwise continue to make satisfactory progress.  She was transferred to 4E progressive care on postop day 6.  The epicardial pacer wires were removed on postop day 7.  She was monitored for another 24 hours. Post-op CXR's showed a progressively enlarging right pleural effusion that had reach moderate size by POD7.  Right ultrasound- guided thoracentesis was performed by IR on our request yielding 542m serous fluid. Follow up CXR showed no evidence of PTX and a smaller effusion. Respiratory status improved.  By the time of discharge she was having periods of SR but mostly remained in [place] QRS and rates of 60-70.    Hannah Zimmerman discharged in stable condition.   Consults: cardiology (EP)  Significant Diagnostic Studies:   CLINICAL DATA:  Status post thoracentesis.   EXAM: CHEST  1 VIEW   COMPARISON:  Chest radiograph June 27, 23.   FINDINGS: No visible pneumothorax. Small right pleural effusion with overlying right basilar atelectasis and/or consolidation. Enlarged cardiac silhouette. CABG. Left atrial appendage clip.   IMPRESSION: 1. No visible pneumothorax status post thoracentesis. 2. Small right pleural effusion with overlying right basilar atelectasis and/or consolidation.     Electronically Signed   By: FMargaretha SheffieldM.D.   On: 03/14/2022 09:52   Treatments: Surgery  Operative Report    DATE OF PROCEDURE: 03/06/2022   PREOPERATIVE DIAGNOSES:  Aortic valve tumor, atrial fibrillation, possible patent foramen ovale.   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  PROCEDURES: Median sternotomy Resection of aortic valve tumor Maze procedure Clipping of left atrial appendage   RIGHT THORACENTESIS  03/14/22   PROCEDURE SUMMARY:   Successful US guided right thoracentesis. Yielded 500 mL of amber fluid. Patient tolerated procedure  well. No immediate complications. EBL = trace     Post procedure chest X-ray reveals no pneumothorax   WENDY S BLAIR PA-C 03/14/2022 12:00 PM        Discharge Exam: Blood pressure 105/81, pulse 67, temperature 97.6 F (36.4 C), temperature source Oral, resp. rate 13, height '5\' 4"'$  (1.626 m), weight 73.2 kg, SpO2 96 %.  General appearance: alert, cooperative, and no distress Neurologic: intact Heart: Junctional rhythm 65 to 80/min. Pacer wires ore out. Lungs: Breath sounds are clear. CXR showing moderate righ pleural effusion. Abdomen: Soft, nontender Extremities: No peripheral edema Incision: Sternotomy incision is well approximated and dry.  Disposition: Discharged to home in stable condition  Discharge Instructions     Amb Referral to Cardiac Rehabilitation   Complete by: As directed    Diagnosis: Valve Repair   Valve: Aortic Comment - tumor   After initial evaluation and assessments completed: Virtual Based Care may be provided alone or in conjunction with Phase 2 Cardiac Rehab based on patient barriers.: Yes      Allergies as of 03/14/2022       Reactions   Codeine Hives      Hydrocodone Hives        Medication List     STOP taking these medications    amiodarone 200 MG tablet Commonly known as: PACERONE   BC FAST PAIN RELIEF PO       TAKE these medications    acetaminophen 500 MG tablet Commonly known as: TYLENOL Take 1,000 mg by mouth every 6 (six) hours as needed (pain/headaches.).   ALPRAZolam 0.25 MG tablet Commonly known as: XANAX Take 0.125 mg by mouth daily as needed for anxiety.   aspirin EC 81 MG tablet Take 1 tablet (81 mg total) by mouth daily. Swallow whole.   atorvastatin 40 MG tablet Commonly known as: LIPITOR Take 1 tablet (40 mg total) by mouth at bedtime.   b complex vitamins capsule Take 1 capsule by mouth daily.   Eliquis 5 MG Tabs tablet Generic drug: apixaban TAKE 1 TABLET(5 MG) BY MOUTH TWICE DAILY    escitalopram 10 MG tablet Commonly known as: LEXAPRO Take 10 mg by mouth daily.   midodrine 5 MG tablet Commonly known as: PROAMATINE Take 3 tablets (15 mg total) by mouth 2 (two) times daily with a meal. What changed: when to take this   traMADol 50 MG tablet Commonly known as: ULTRAM Take 1 tablet (50 mg total) by mouth every 6 (six) hours as needed for moderate pain.   VITAMIN D3 PO Take 1 tablet by mouth in the morning.        Follow-up Information     Rise Mu, PA-C. Go on 03/27/2022.   Specialties: Physician Assistant, Cardiology, Radiology Why: Your appointment is at 10:05 AM. Contact information: [place] [address] [phone]         [facility]-Cardiac[facility]. Go on 04/03/2022.   Specialty: Cardiothoracic Surgery Why: Your appointment is at 3 PM. Please arrive 30 minutes early for chest x-ray to be performed by [facility] located on the first floor of  the same building Contact information: [place], [place] [phone]        Constance Haw, MD. Go on 04/21/2022.   Specialty: Cardiology Why: Your appointment is at 3:30pm. Contact information: [place] [address] [phone]                 Signed: Antony Odea, PA-C 03/14/2022, 12:15 PM

## 2022-03-10 NOTE — Progress Notes (Signed)
EVENING ROUNDS NOTE :     [address]       [place] [address]             [phone]                 4 Days Post-Op Procedure(s) (LRB): RESECTION OF AORTIC VALVE TUMOR (N/A) MAZE (N/A) CLIPPING OF ATRIAL APPENDAGE USING ATRICURE  CLIP SIZE (N/A) TRANSESOPHAGEAL ECHOCARDIOGRAM (TEE) (N/A)   Total Length of Stay:  LOS: 4 days  Events:   No events EP on board, obs for now   BP (!) 116/93   Pulse (!) 57   Temp 98 F (36.7 C) (Oral)   Resp 16   Ht 5\' 4"  (1.626 m)   Wt 73.5 kg   SpO2 96%   BMI 27.81 kg/m          sodium chloride Stopped (03/10/22 0700)   sodium chloride Stopped (03/07/22 0700)   sodium chloride 20 mL/hr at 03/10/22 1607   lactated ringers Stopped (03/06/22 1724)   lactated ringers Stopped (03/08/22 1926)   phenylephrine (NEO-SYNEPHRINE) Adult infusion 35 mcg/min (03/07/22 2300)    I/O last 3 completed shifts: In: 863.7 [P.O.:300; I.V.:463.6; IV Piggyback:100.1] Out: 570 [Urine:400; Chest Tube:170]      Latest Ref Rng & Units 03/10/2022    4:28 AM 03/09/2022    4:37 AM 03/08/2022    4:34 AM  CBC  WBC 4.0 - 10.5 K/uL 13.9  16.6  16.1   Hemoglobin 12.0 - 15.0 g/dL 8.7  9.1  8.8   Hematocrit 36.0 - 46.0 % 26.0  26.6  26.6   Platelets 150 - 400 K/uL 148  134  119        Latest Ref Rng & Units 03/10/2022    4:28 AM 03/09/2022    4:37 AM 03/08/2022    4:34 AM  BMP  Glucose 70 - 99 mg/dL 97  914  782   BUN 6 - 20 mg/dL 34  21  12   Creatinine 0.44 - 1.00 mg/dL 9.56  2.13  0.86   Sodium 135 - 145 mmol/L 139  136  137   Potassium 3.5 - 5.1 mmol/L 4.4  4.1  4.4   Chloride 98 - 111 mmol/L 107  107  109   CO2 22 - 32 mmol/L 23  24  23    Calcium 8.9 - 10.3 mg/dL 8.7  8.5  8.6     ABG    Component Value Date/Time   PHART 7.314 (L) 03/06/2022 2124   PCO2ART 40.1 03/06/2022 2124   PO2ART 139 (H) 03/06/2022 2124   HCO3 20.5 03/06/2022 2124   TCO2 22 03/06/2022 2124   ACIDBASEDEF 5.0 (H) 03/06/2022 2124   O2SAT 56.5  03/08/2022 0434       Brynda Greathouse, MD 03/10/2022 4:52 PM

## 2022-03-11 DIAGNOSIS — Z9889 Other specified postprocedural states: Secondary | ICD-10-CM | POA: Diagnosis not present

## 2022-03-11 DIAGNOSIS — Z8679 Personal history of other diseases of the circulatory system: Secondary | ICD-10-CM | POA: Diagnosis not present

## 2022-03-11 LAB — GLUCOSE, CAPILLARY
Glucose-Capillary: 101 mg/dL — ABNORMAL HIGH (ref 70–99)
Glucose-Capillary: 94 mg/dL (ref 70–99)
Glucose-Capillary: 89 mg/dL (ref 70–99)
Glucose-Capillary: 75 mg/dL (ref 70–99)

## 2022-03-12 LAB — BASIC METABOLIC PANEL
Anion gap: 10 (ref 5–15)
BUN: 37 mg/dL — ABNORMAL HIGH (ref 6–20)
CO2: 23 mmol/L (ref 22–32)
Calcium: 8.6 mg/dL — ABNORMAL LOW (ref 8.9–10.3)
Chloride: 106 mmol/L (ref 98–111)
Creatinine, Ser: 0.79 mg/dL (ref 0.44–1.00)
GFR, Estimated: >60 mL/min (ref >60)
Glucose, Bld: 98 mg/dL (ref 70–99)
Potassium: 4 mmol/L (ref 3.5–5.1)
Sodium: 139 mmol/L (ref 135–145)

## 2022-03-12 LAB — GLUCOSE, CAPILLARY
Glucose-Capillary: 96 mg/dL (ref 70–99)
Glucose-Capillary: 81 mg/dL (ref 70–99)
Glucose-Capillary: 104 mg/dL — ABNORMAL HIGH (ref 70–99)

## 2022-03-12 MED ORDER — SODIUM CHLORIDE 0.9 % IV SOLN: 250.0000 mL | INTRAVENOUS | Status: DC | PRN

## 2022-03-12 MED ORDER — SODIUM CHLORIDE 0.9% FLUSH
3.0000 mL | Freq: Two times a day (BID) | INTRAVENOUS | Status: DC
Start: 2022-03-12 — End: 2022-03-14
  Administered 2022-03-12 – 2022-03-13 (×3): 3 mL via INTRAVENOUS

## 2022-03-12 MED ORDER — SODIUM CHLORIDE 0.9% FLUSH: 3.0000 mL | INTRAVENOUS | Status: DC | PRN

## 2022-03-12 MED ORDER — ~~LOC~~ CARDIAC SURGERY, PATIENT & FAMILY EDUCATION
Freq: Once | Status: AC
Start: 2022-03-12 — End: 2022-03-12

## 2022-03-12 MED ORDER — ALBUMIN HUMAN 5 % IV SOLN: 12.5000 g | Freq: Once | INTRAVENOUS | Status: DC

## 2022-03-13 DIAGNOSIS — Z8679 Personal history of other diseases of the circulatory system: Secondary | ICD-10-CM | POA: Diagnosis not present

## 2022-03-13 DIAGNOSIS — R001 Bradycardia, unspecified: Secondary | ICD-10-CM | POA: Diagnosis not present

## 2022-03-13 DIAGNOSIS — Z9889 Other specified postprocedural states: Secondary | ICD-10-CM | POA: Diagnosis not present

## 2022-03-13 LAB — GLUCOSE, CAPILLARY
Glucose-Capillary: 136 mg/dL — ABNORMAL HIGH (ref 70–99)
Glucose-Capillary: 122 mg/dL — ABNORMAL HIGH (ref 70–99)
Glucose-Capillary: 228 mg/dL — ABNORMAL HIGH (ref 70–99)
Glucose-Capillary: 68 mg/dL — ABNORMAL LOW (ref 70–99)
Glucose-Capillary: 92 mg/dL (ref 70–99)

## 2022-03-13 LAB — BASIC METABOLIC PANEL
Anion gap: 7 (ref 5–15)
BUN: 30 mg/dL — ABNORMAL HIGH (ref 6–20)
CO2: 25 mmol/L (ref 22–32)
Calcium: 8.7 mg/dL — ABNORMAL LOW (ref 8.9–10.3)
Chloride: 107 mmol/L (ref 98–111)
Creatinine, Ser: 0.76 mg/dL (ref 0.44–1.00)
GFR, Estimated: >60 mL/min (ref >60)
Glucose, Bld: 94 mg/dL (ref 70–99)
Potassium: 4 mmol/L (ref 3.5–5.1)
Sodium: 139 mmol/L (ref 135–145)

## 2022-03-13 LAB — CBC
HCT: 25.8 % — ABNORMAL LOW (ref 36.0–46.0)
Hemoglobin: 8.5 g/dL — ABNORMAL LOW (ref 12.0–15.0)
MCH: 34.7 pg — ABNORMAL HIGH (ref 26.0–34.0)
MCHC: 32.9 g/dL (ref 30.0–36.0)
MCV: 105.3 fL — ABNORMAL HIGH (ref 80.0–100.0)
Platelets: 182 10*3/uL (ref 150–400)
RBC: 2.45 MIL/uL — ABNORMAL LOW (ref 3.87–5.11)
RDW: 16.1 % — ABNORMAL HIGH (ref 11.5–15.5)
WBC: 7.9 10*3/uL (ref 4.0–10.5)
nRBC: 5.5 % — ABNORMAL HIGH (ref 0.0–0.2)

## 2022-03-13 LAB — PATHOLOGIST SMEAR REVIEW

## 2022-03-13 NOTE — Progress Notes (Addendum)
Electrophysiology Rounding Note  Patient Name: Hannah Zimmerman Date of Encounter: 03/13/2022  Primary Cardiologist: Julien Nordmann, MD Electrophysiologist: Hannah Lemus Hannah Loa, MD   Subjective   The patient is doing well today.  At this time, the patient denies chest pain, shortness of breath, or any new concerns.  Inpatient Medications    Scheduled Meds:  aspirin EC  81 mg Oral Daily   atorvastatin  40 mg Oral QHS   bisacodyl  10 mg Oral Daily   Or   bisacodyl  10 mg Rectal Daily   Chlorhexidine Gluconate Cloth  6 each Topical Daily   docusate sodium  200 mg Oral Daily   enoxaparin (LOVENOX) injection  40 mg Subcutaneous QHS   escitalopram  10 mg Oral Daily   insulin aspart  0-15 Units Subcutaneous TID WC   midodrine  10 mg Oral BID WC   pantoprazole  40 mg Oral Daily   sodium chloride flush  10-40 mL Intracatheter Q12H   sodium chloride flush  3 mL Intravenous Q12H   sodium chloride flush  3 mL Intravenous Q12H   Continuous Infusions:  sodium chloride Stopped (03/10/22 0700)   sodium chloride Stopped (03/07/22 0700)   sodium chloride 20 mL/hr at 03/10/22 1707   sodium chloride     albumin human Stopped (03/12/22 1206)   lactated ringers Stopped (03/06/22 1724)   lactated ringers Stopped (03/08/22 1926)   PRN Meds: sodium chloride, sodium chloride, ALPRAZolam, metoprolol tartrate, ondansetron (ZOFRAN) IV, oxyCODONE, sodium chloride flush, sodium chloride flush, traMADol   Vital Signs    Vitals:   03/12/22 1616 03/12/22 1653 03/12/22 2029 03/13/22 0400  BP: 116/77 112/85 120/79 (!) 107/59  Pulse:  68 78   Resp: 15 16 19    Temp:  97.8 F (36.6 C) 98 F (36.7 C) 98.1 F (36.7 C)  TempSrc:  Oral Oral Oral  SpO2:  100% 99%   Weight:    74 kg  Height:        Intake/Output Summary (Last 24 hours) at 03/13/2022 0747 Last data filed at 03/12/2022 1700 Gross per 24 hour  Intake 360 ml  Output --  Net 360 ml   Filed Weights   03/10/22 0400 03/11/22 0500  03/13/22 0400  Weight: 73.5 kg 74 kg 74 kg    Physical Exam    GEN- The patient is well appearing, alert and oriented x 3 today.   Head- normocephalic, atraumatic Eyes-  Sclera clear, conjunctiva pink Ears- hearing intact Oropharynx- clear Neck- supple Lungs- Clear to ausculation bilaterally, normal work of breathing Heart- Regular rate and rhythm, no murmurs, rubs or gallops GI- soft, NT, ND, + BS Extremities- no clubbing or cyanosis. No edema Skin- no rash or lesion Psych- euthymic mood, full affect Neuro- strength and sensation are intact  Labs    CBC Recent Labs    03/13/22 0221  WBC 7.9  HGB 8.5*  HCT 25.8*  MCV 105.3*  PLT 182   Basic Metabolic Panel Recent Labs    69/62/95 1102 03/13/22 0221  NA 139 139  K 4.0 4.0  CL 106 107  CO2 23 25  GLUCOSE 98 94  BUN 37* 30*  CREATININE 0.79 0.76  CALCIUM 8.6* 8.7*   Liver Function Tests No results for input(s): "AST", "ALT", "ALKPHOS", "BILITOT", "PROT", "ALBUMIN" in the last 72 hours. No results for input(s): "LIPASE", "AMYLASE" in the last 72 hours. Cardiac Enzymes No results for input(s): "CKTOTAL", "CKMB", "CKMBINDEX", "TROPONINI" in the last 72 hours.  Telemetry    Junctional rhythm in the 60-70s, skipped beat overnight.  (personally reviewed)  Radiology    No results found.  Patient Profile     51 y.o. female admitted after a papillary fibroelastoma diagnosed, s/p removal  with MAZE, LAA clipping. She was initially bradycardic with sinus node dysfunction, now improved.  Assessment & Plan    Sinus node dysfunction Junctional rhythm HR stable in 60-70s, has been ambulatory without symptoms of bradycardia.  Hannah Zimmerman discuss possibility of Zio with MD Has not required further pacing since turned down Potentially home as early as today per [facility] APP.   Dr. Elberta Zimmerman to see.   ADDENDUM  Dr. Elberta Zimmerman has seen. With lack of symptoms, no indication for Zio at this time. 1 month f/u made. If pt becomes  bradycardia, increasingly fatigued, or has syncope/near syncope at home she verbalized understanding to seek care.   For questions or updates, please contact [facility] Please consult [email or web address] for contact info under Cardiology/STEMI.  Signed, Hannah Freer, PA-C  03/13/2022, 7:47 AM   I have seen and examined this patient with Hannah Zimmerman.  Agree with above, note added to reflect my findings.  Patient is feeling well.  She has been ambulating without issue.  No complaints at this time.  GEN: Well nourished, well developed, in no acute distress  HEENT: normal  Neck: no JVD, carotid bruits, or masses Cardiac: RRR; no murmurs, rubs, or gallops,no edema  Respiratory:  clear to auscultation bilaterally, normal work of breathing GI: soft, nontender, nondistended, + BS MS: no deformity or atrophy  Skin: warm and dry Neuro:  Strength and sensation are intact Psych: euthymic mood, full affect   Junctional rhythm: QRS is the same in her current rhythm as it is in sinus.  This is likely a stable rhythm.  At this point, would be okay with discharge and follow-up in clinic.  If she needs a pacemaker, would plan for pacemaker implant as an outpatient.  It is likely that her AV conduction Hannah Zimmerman return and we Hannah Zimmerman be able to avoid pacemaker implant.  Hannah Zimmerman M. Hannah Hook MD 03/13/2022 9:54 AM

## 2022-03-14 ENCOUNTER — Inpatient Hospital Stay (HOSPITAL_COMMUNITY): Payer: Commercial Managed Care - HMO

## 2022-03-14 HISTORY — PX: IR THORACENTESIS ASP PLEURAL SPACE W/IMG GUIDE: IMG5380

## 2022-03-14 LAB — CREATININE, SERUM
Creatinine, Ser: 0.82 mg/dL (ref 0.44–1.00)
GFR, Estimated: >60 mL/min (ref >60)

## 2022-03-14 LAB — GLUCOSE, CAPILLARY
Glucose-Capillary: 90 mg/dL (ref 70–99)
Glucose-Capillary: 91 mg/dL (ref 70–99)

## 2022-03-14 MED ORDER — ASPIRIN 81 MG PO TBEC: 81.0000 mg | DELAYED_RELEASE_TABLET | Freq: Every day | ORAL | 12 refills | Status: DC

## 2022-03-14 MED ORDER — TRAMADOL HCL 50 MG PO TABS: 50.0000 mg | ORAL_TABLET | Freq: Four times a day (QID) | ORAL | 0 refills | Status: DC | PRN

## 2022-03-14 MED ORDER — MIDODRINE HCL 5 MG PO TABS: 15.0000 mg | ORAL_TABLET | Freq: Two times a day (BID) | ORAL | 2 refills | Status: DC

## 2022-03-14 MED ORDER — ACETAMINOPHEN 325 MG PO TABS
650.0000 mg | ORAL_TABLET | Freq: Four times a day (QID) | ORAL | Status: DC | PRN
  Administered 2022-03-14: 650 mg via ORAL
  Filled 2022-03-14: qty 2

## 2022-03-14 MED ORDER — LIDOCAINE HCL (PF) 1 % IJ SOLN
INTRAMUSCULAR | Status: DC | PRN
  Administered 2022-03-14: 5 mL

## 2022-03-14 MED ORDER — LIDOCAINE HCL 1 % IJ SOLN
INTRAMUSCULAR | Status: AC
  Filled 2022-03-14: qty 20

## 2022-03-14 NOTE — Procedures (Signed)
PROCEDURE SUMMARY:  Successful US guided right thoracentesis. Yielded 500 mL of amber fluid. Patient tolerated procedure well. No immediate complications. EBL = trace   Post procedure chest X-ray reveals no pneumothorax  Xinyi Batton S Teruko Joswick PA-C 03/14/2022 12:00 PM

## 2022-03-14 NOTE — Progress Notes (Signed)
CARDIAC REHAB PHASE I    Walk offered pt going to IR soon will continue to follow.    Woodroe Chen, RN BSN 03/14/2022 8:51 AM

## 2022-03-14 NOTE — Progress Notes (Signed)
Pt discharged from unit. Medication/discharge instruction given  Kahlani Graber K Reigna Ruperto, RN  

## 2022-03-15 ENCOUNTER — Encounter: Payer: Self-pay | Admitting: Cardiology

## 2022-03-17 ENCOUNTER — Other Ambulatory Visit
Admission: RE | Admit: 2022-03-17 | Discharge: 2022-03-17 | Disposition: A | Discharge status: Home / Self Care | Payer: Commercial Managed Care - HMO | Attending: Family Medicine | Admitting: Family Medicine

## 2022-03-17 DIAGNOSIS — I4891 Unspecified atrial fibrillation: Secondary | ICD-10-CM | POA: Insufficient documentation

## 2022-03-17 DIAGNOSIS — R600 Localized edema: Secondary | ICD-10-CM | POA: Diagnosis not present

## 2022-03-17 DIAGNOSIS — Z9889 Other specified postprocedural states: Secondary | ICD-10-CM | POA: Insufficient documentation

## 2022-03-17 LAB — BRAIN NATRIURETIC PEPTIDE: B Natriuretic Peptide: 1144.1 pg/mL — ABNORMAL HIGH (ref 0.0–100.0)

## 2022-03-21 LAB — ECHO INTRAOPERATIVE TEE
AR max vel: 2.07 cm2
AV Area VTI: 2.03 cm2
AV Area mean vel: 2.15 cm2
AV Mean grad: 3 mmHg
AV Peak grad: 7.1 mmHg
Ao pk vel: 1.33 m/s
Height: 64 in
MV VTI: 2.74 cm2
Weight: 2304 oz

## 2022-03-24 ENCOUNTER — Emergency Department: Payer: Commercial Managed Care - HMO

## 2022-03-24 ENCOUNTER — Other Ambulatory Visit: Payer: Self-pay

## 2022-03-24 ENCOUNTER — Emergency Department
Admission: EM | Admit: 2022-03-24 | Discharge: 2022-03-24 | Disposition: A | Discharge status: Home / Self Care | Payer: Commercial Managed Care - HMO | Attending: Emergency Medicine | Admitting: Emergency Medicine

## 2022-03-24 DIAGNOSIS — J90 Pleural effusion, not elsewhere classified: Secondary | ICD-10-CM | POA: Insufficient documentation

## 2022-03-24 DIAGNOSIS — R0602 Shortness of breath: Secondary | ICD-10-CM | POA: Diagnosis present

## 2022-03-24 DIAGNOSIS — R600 Localized edema: Secondary | ICD-10-CM | POA: Diagnosis not present

## 2022-03-24 DIAGNOSIS — R778 Other specified abnormalities of plasma proteins: Secondary | ICD-10-CM | POA: Insufficient documentation

## 2022-03-24 DIAGNOSIS — R609 Edema, unspecified: Secondary | ICD-10-CM

## 2022-03-24 DIAGNOSIS — Z9889 Other specified postprocedural states: Secondary | ICD-10-CM

## 2022-03-24 LAB — CBC
HCT: 30.5 % — ABNORMAL LOW (ref 36.0–46.0)
Hemoglobin: 9.8 g/dL — ABNORMAL LOW (ref 12.0–15.0)
MCH: 33.1 pg (ref 26.0–34.0)
MCHC: 32.1 g/dL (ref 30.0–36.0)
MCV: 103 fL — ABNORMAL HIGH (ref 80.0–100.0)
Platelets: 492 10*3/uL — ABNORMAL HIGH (ref 150–400)
RBC: 2.96 MIL/uL — ABNORMAL LOW (ref 3.87–5.11)
RDW: 16.9 % — ABNORMAL HIGH (ref 11.5–15.5)
WBC: 7.4 10*3/uL (ref 4.0–10.5)
nRBC: 0 % (ref 0.0–0.2)

## 2022-03-24 LAB — BASIC METABOLIC PANEL
Anion gap: 10 (ref 5–15)
BUN: 23 mg/dL — ABNORMAL HIGH (ref 6–20)
CO2: 25 mmol/L (ref 22–32)
Calcium: 8.6 mg/dL — ABNORMAL LOW (ref 8.9–10.3)
Chloride: 103 mmol/L (ref 98–111)
Creatinine, Ser: 1.52 mg/dL — ABNORMAL HIGH (ref 0.44–1.00)
GFR, Estimated: 42 mL/min — ABNORMAL LOW (ref >60)
Glucose, Bld: 108 mg/dL — ABNORMAL HIGH (ref 70–99)
Potassium: 3.5 mmol/L (ref 3.5–5.1)
Sodium: 138 mmol/L (ref 135–145)

## 2022-03-24 LAB — TROPONIN I (HIGH SENSITIVITY)
Troponin I (High Sensitivity): 1848 ng/L (ref <18)
Troponin I (High Sensitivity): 1793 ng/L (ref <18)

## 2022-03-24 LAB — BRAIN NATRIURETIC PEPTIDE: B Natriuretic Peptide: 877.4 pg/mL — ABNORMAL HIGH (ref 0.0–100.0)

## 2022-03-24 MED ORDER — MIDODRINE HCL 5 MG PO TABS: 10.0000 mg | ORAL_TABLET | Freq: Three times a day (TID) | ORAL | Status: DC

## 2022-03-24 MED ORDER — MIDODRINE HCL 5 MG PO TABS: 10.0000 mg | ORAL_TABLET | ORAL | Status: DC

## 2022-03-24 MED ORDER — FUROSEMIDE 20 MG PO TABS: 20.0000 mg | ORAL_TABLET | Freq: Every day | ORAL | 0 refills | Status: DC

## 2022-03-24 MED ORDER — MIDODRINE HCL 10 MG PO TABS: 10.0000 mg | ORAL_TABLET | Freq: Three times a day (TID) | ORAL | 0 refills | Status: AC

## 2022-03-24 MED ORDER — FUROSEMIDE 10 MG/ML IJ SOLN
20.0000 mg | Freq: Once | INTRAMUSCULAR | Status: AC
  Administered 2022-03-24: 20 mg via INTRAVENOUS
  Filled 2022-03-24: qty 4

## 2022-03-24 NOTE — Progress Notes (Deleted)
Cardiology Office Note    Date:  03/24/2022   ID:  [name], DOB 1970-12-04, MRN [id number]  PCP:  Donnamarie Rossetti, PA-C  Cardiologist:  [name], MD  Electrophysiologist:  Constance Haw, MD   Chief Complaint: ***  History of Present Illness:   Hannah Zimmerman is a 51 y.o. female with history of ***  ***   Labs independently reviewed: 02/2022 - Hgb 9.9, PLT 373, potassium 4.1, BUN 34, serum creatinine 1.2, albumin 3.8, AST 95, ALT 148, TSH normal, BNP 1144, magnesium 2.7, A1c 5.2 01/2022 - TC 128, TG 65, HDL 50, LDL 25  Past Medical History:  Diagnosis Date   (HFpEF) heart failure with preserved ejection fraction ([place])    a. 07/2019 Echo: EF 60-65%, mod LVH. Sev dil LA, mildly dil RA. Mod elev PASP; b. 07/2020 TEE: EF 50-55%, no rwma, sev conc LVH. Nl RV fxn. Mod dil RA. Mild-mod MR, mod TR.   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 07/2020 Cardiac CT: Cor Ca2+ = 338 (99th %'ile).   Complication of anesthesia 20 years ago   a. spinal with first c section went too high stopped breathing, low bp with 2nd c section, no further issues with anesthesia   Heart murmur    a. 07/2019 Echo: no significant valvular dzs; b. 07/2020 TEE: Mild-mod MR, mod TR.   History of hiatal hernia    Hypertrophic cardiomyopathy ([place])    a. 07/2019 Echo: Mod LVH; b. 01/2020 cMRI: EF 67%, HCM w/o obstruction. Max wall thickness 27m. Sev LAE w/ L->R atrial level shunt. Large area of LGE @ mid-ventricular level in area of max wall thickness; c. 07/2020 Cardiac CT: Asymm hypertrophy up to 164min mid inferoseptum consistent w/ known HCM.   Morbid obesity ([facility]   a. 09/2014 s/p gastric bypass.   Persistent atrial fibrillation ([facility]   a. Dx 07/2019-->CHA2DS2VASc = 2 (diast CHF/Fem)-->Eliquis; b. 09/2018 s/p DCCV (150J (biphasic) x 1); c. 12/2019 RFCA/PVI; d. 07/2020 repeat RFCA/PVI.   Rash    both arms from old bed bug bites, healing   [facility]    Tobacco abuse    Typical atrial flutter ([facility]   a. 12/2019 s/p RFCA.    Past Surgical History:  Procedure Laterality Date   ABDOMINAL HYSTERECTOMY     ANKLE SURGERY     left x 2   ATRIAL FIBRILLATION ABLATION N/A 12/24/2019   Procedure: ATRIAL FIBRILLATION ABLATION;  Surgeon: CaConstance HawMD;  [facility];  Service: Cardiovascular;  Laterality: N/A;   ATRIAL FIBRILLATION ABLATION N/A 08/11/2020   Procedure: ATRIAL FIBRILLATION ABLATION;  Surgeon: CaConstance HawMD;  [facility];  Service: Cardiovascular;  Laterality: N/A;   CARDIOVERSION N/A 09/24/2019   Procedure: CARDIOVERSION;  Surgeon: GoMinna MerrittsMD;  [place] [facility];  Service: Cardiovascular;  Laterality: N/A;   CARDIOVERSION N/A 10/17/2019   Procedure: CARDIOVERSION;  Surgeon: GoMinna MerrittsMD;  [facility];  Service: Cardiovascular;  Laterality: N/A;   CARDIOVERSION N/A 11/08/2021   Procedure: CARDIOVERSION;  Surgeon: GoMinna MerrittsMD;  Location: [facility];  Service: Cardiovascular;  Laterality: N/A;   CESAREAN SECTION     x 3   CLIPPING OF ATRIAL APPENDAGE N/A 03/06/2022   Procedure: CLIPPING OF ATRIAL APPENDAGE USING ATRICURE  CLIP SIZE 45MM;  Surgeon: HeMelrose NakayamaMD;  [place] [facility] Service: Open  Heart Surgery;  Laterality: N/A;   EXCISION OF ATRIAL MYXOMA N/A 03/06/2022   Procedure: RESECTION OF AORTIC VALVE TUMOR;  Surgeon: Melrose Nakayama, MD;  Location: [place];  Service: Open Heart Surgery;  Laterality: N/A;   HERNIA REPAIR     x2 one was for sure a hiatal hernia   IR ANGIO EXTRACRAN SEL COM CAROTID INNOMINATE UNI L MOD SED  11/13/2021   IR CT HEAD LTD  11/13/2021   IR PERCUTANEOUS ART THROMBECTOMY/INFUSION INTRACRANIAL INC DIAG ANGIO  11/13/2021   IR THORACENTESIS ASP PLEURAL SPACE W/IMG GUIDE  03/14/2022   LAPAROSCOPIC GASTRIC SLEEVE RESECTION WITH HIATAL HERNIA REPAIR  08/17/2015   Procedure: LAPAROSCOPIC GASTRIC SLEEVE  RESECTION WITH HIATAL HERNIA REPAIR;  Surgeon: Johnathan Hausen, MD;  [facility];  Service: General;;   MAZE N/A 03/06/2022   Procedure: MAZE;  Surgeon: Melrose Nakayama, MD;  Location: [place];  Service: Open Heart Surgery;  Laterality: N/A;   RADIOLOGY WITH ANESTHESIA N/A 11/12/2021   Procedure: IR WITH ANESTHESIA;  Surgeon: Luanne Bras, MD;  Location: [place];  Service: Radiology;  Laterality: N/A;   TEE WITHOUT CARDIOVERSION N/A 08/11/2020   Procedure: TRANSESOPHAGEAL ECHOCARDIOGRAM (TEE);  Surgeon: Constance Haw, MD;  Location: [place] CV LAB;  Service: Cardiovascular;  Laterality: N/A;   TEE WITHOUT CARDIOVERSION N/A 11/08/2021   Procedure: TRANSESOPHAGEAL ECHOCARDIOGRAM (TEE);  Surgeon: Minna Merritts, MD;  Location: [facility];  Service: Cardiovascular;  Laterality: N/A;   TEE WITHOUT CARDIOVERSION N/A 03/06/2022   Procedure: TRANSESOPHAGEAL ECHOCARDIOGRAM (TEE);  Surgeon: Melrose Nakayama, MD;  Location: [place];  Service: Open Heart Surgery;  Laterality: N/A;   TUBAL LIGATION     x 2   UPPER GI ENDOSCOPY  08/17/2015   Procedure: UPPER GI ENDOSCOPY;  Surgeon: Johnathan Hausen, MD;  [facility];  Service: General;;    Current Medications: No outpatient medications have been marked as taking for the 03/27/22 encounter (Appointment) with Rise Mu, PA-C.    Allergies:   Codeine and Hydrocodone   Social History   Socioeconomic History   Marital status: Significant Other    Spouse name: Not on file   Number of children: 3   Years of education: two years of college   Highest education level: Not on file  Occupational History   Occupation: office assistant  Tobacco Use   Smoking status: Every Day    Packs/day: 0.25    Years: 31.00    Total pack years: 7.75    Types: Cigarettes    Passive exposure: Current   Smokeless tobacco: Never   Tobacco comments:    5 cigarettes daily   Vaping Use   Vaping Use: Never used  Substance and Sexual Activity    Alcohol use: Yes    Alcohol/week: 1.0 - 2.0 standard drink of alcohol    Types: 1 - 2 Standard drinks or equivalent per week    Comment: Occaional use - 1-2 mixed drinks prior to stroke since d/c nothing 11/30/21   Drug use: No   Sexual activity: Yes  Other Topics Concern   Not on file  Social History Narrative   Lives at home with significant other.   Some caffeine daily I her green tea.   Right-handed.   Social Determinants of Health   Financial Resource Strain: Low Risk  (08/22/2019)   Overall Financial Resource Strain (CARDIA)    Difficulty of Paying Living Expenses: Not hard at all  Food Insecurity: No Food Insecurity (  08/22/2019)   Hunger Vital Sign    Worried About Running Out of Food in the Last Year: Never true    [place] in the Last Year: Never true  Transportation Needs: No Transportation Needs (11/29/2021)   PRAPARE - Hydrologist (Medical): No    Lack of Transportation (Non-Medical): No  Physical Activity: Sufficiently Active (08/22/2019)   Exercise Vital Sign    Days of Exercise per Week: 7 days    Minutes of Exercise per Session: 150+ min  Stress: No Stress Concern Present (08/22/2019)   [place]    Feeling of Stress : Only a little  Social Connections: Moderately Isolated (08/22/2019)   Social Connection and Isolation Panel [NHANES]    Frequency of Communication with Friends and Family: More than three times a week    Frequency of Social Gatherings with Friends and Family: More than three times a week    Attends Religious Services: 1 to 4 times per year    Active Member of [employer] or Organizations: No    Attends Archivist Meetings: Never    Marital Status: Separated     Family History:  The patient's family history includes Diabetes in her mother; Heart failure in her mother; Hypertension in her mother.  ROS:   ROS   EKGs/Labs/Other Studies  Reviewed:    Studies reviewed were summarized above. The additional studies were reviewed today:  Intraoperative TEE 9/32/3557: Complications: No known complications during this procedure.  POST-OP IMPRESSIONS  _ Left Ventricle: has hyperdynamic systolic function, with an ejection  fraction  of 75%. The cavity size was decreased. The wall motion is normal.  _ Right Ventricle: normal function. The wall motion is normal.  _ Aorta: there is no dissection present in the aorta.  _ Aortic Valve: No stenosis present. There is trace central regurgitation.  _ Mitral Valve: There is moderate regurgitation.  _ Tricuspid Valve: There is moderate regurgitation.  __________  Carotid artery ultrasound/vascular ultrasound 03/02/2022: Summary:  Right Carotid: Velocities in the right ICA are consistent with a 1-39%  stenosis.   Left Carotid: Velocities in the left ICA are consistent with a 1-39%  stenosis.  Vertebrals:  Bilateral vertebral arteries demonstrate antegrade flow.  Subclavians: Normal flow hemodynamics were seen in bilateral subclavian arteries.   Right Upper Extremity: Doppler waveform obliterate with right radial  compression. Doppler waveforms decrease <50% with right ulnar compression.  Left Upper Extremity: Doppler waveforms remain within normal limits with  left radial compression. Doppler waveforms remain within normal limits  with left ulnar compression.  __________  Coronary CTA with morphology 01/27/2022: Noncardiac over read -  IMPRESSION: 1. Enlarged pulmonary artery suggesting pulmonary hypertension. 2. No mediastinal or hilar mass or adenopathy. 3. No significant pulmonary findings.  Cardiac overread -  Coronary calcium score: The patient's coronary artery calcium score is 393, which places the patient in the 99th percentile.   Coronary arteries: Normal coronary origins. Right dominance. Evaluation was limited as nitroglycerin was not administered.   Right  Coronary Artery: Dominant. Minimal mixed 1-24% proximal stenosis (CADRADS1).   Left Main Coronary Artery: Normal. Bifurcates into the LAD and LCx arteries.   Left Anterior Descending Coronary Artery: Minimal mixed proximal 1-24% stenosis (CADRADS1). The distal LAD is not visualized.   Left Circumflex Artery: Large AV groove vessel with minimal mixed 1-24% proximal stenosis (CADRADS1). There is a large distal OM1 branch without disease.  Aorta: Normal size, 30 mm at the mid ascending aorta (level of the PA bifurcation) measured double oblique. No calcifications. No dissection.   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   Other findings:   Normal pulmonary vein drainage into the left atrium.   Normal left atrial appendage without a thrombus.   Significantly dilated main pulmonary artery to 40 mm, suggestive of pulmonary hypertension.   Severe proximal ventricular septal thickening c/w diagnosis of HCM.   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   IMPRESSION: 1. Minimal mixed non-obstructive CAD, CADRADS = 1 (findings limited due to lack of nitroglycerin administration). 2. Coronary calcium score of 393. This was 99th percentile for age and sex matched control. 3. Normal coronary origin with right dominance. 4. 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 5. Significantly dilated main pulmonary artery to 40 mm, suggestive of pulmonary hypertension. 6. Severe proximal ventricular septal thickening c/w diagnosis of HCM. 7. 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 __________  2D echo 11/13/2021: 1. Left ventricular ejection fraction, by estimation, is 60 to 65%. The  left ventricle has normal  function. The left ventricle has no regional  wall motion abnormalities. There is severe concentric left ventricular  hypertrophy. Indeterminate diastolic  filling due to E-A fusion. Elevated left ventricular end-diastolic  pressure.   2. Right ventricular systolic function is normal. The right ventricular  size is moderately enlarged.   3. Right atrial size was moderately dilated.   4. Left atrial size was severely dilated.   5. The mitral valve is normal in structure. Mild mitral valve  regurgitation. No evidence of mitral stenosis.   6. There is a soft tissue density on the noncoronary cusp extending into  the LVOT. 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 Differential dx includes  endocarditis (in right clinical setting) or valvular myxmoa. Images from  TEE done 11/08/21 demonstrate the   same soft tissue density (images 34-37 and 42 on TEE). There is also a  prominent nodule of Arantius on the [facility] that is normal finding.   7. Recommend cMRI for further tissue evaluation of the aortic valve mass. __________  TEE 11/08/2021: 1. Left ventricular ejection fraction, by estimation, is 50-55 %. The  left ventricle has normal function. The left ventricle has no regional  wall motion abnormalities. There is mild concentric left ventricular  hypertrophy.   2. Right ventricular systolic function is normal. The right ventricular  size is normal.   3. Left atrial size was severely dilated. No left atrial/left atrial  appendage thrombus was detected.   4. Right atrial size was moderately dilated.   5. The mitral valve is normal in structure. No evidence of mitral valve  regurgitation. No evidence of mitral stenosis.   6. Tricuspid valve regurgitation is moderate.   7. The aortic valve is normal in structure. Aortic valve regurgitation is  not visualized. Aortic valve sclerosis is present, with no evidence of  aortic valve stenosis.   8. The inferior vena cava is normal in size with greater  than 50%  respiratory variability, suggesting right atrial pressure of 3 mmHg.   9. Agitated saline contrast bubble study was negative, with no evidence  of any interatrial shunt.  __________  TEE 08/11/2020:  1. Left ventricular ejection fraction, by estimation, is 50 to 55%. The  left ventricle has low normal function. The left ventricle has no regional  wall motion abnormalities. There is severe concentric left ventricular  hypertrophy. Left ventricular  diastolic function could not be evaluated.   2. Right ventricular systolic function is normal. The right ventricular  size is normal.   3. Left atrial size was severely dilated. No left atrial/left atrial  appendage thrombus was detected. The LAA emptying velocity was 36 cm/s.   4. Right atrial size was moderately dilated.   5. The mitral valve is grossly normal. Mild to moderate mitral valve  regurgitation.   6. Tricuspid valve regurgitation is moderate.   7. The aortic valve is tricuspid. There is mild calcification of the  aortic valve. There is mild thickening of the aortic valve. Aortic valve  regurgitation is not visualized.   8. The left upper pulmonary vein and left lower pulmonary vein are  abnormal.   9. Evidence of atrial level shunting detected by color flow Doppler. __________  Coronary CTA with morphology 08/04/2020: IMPRESSION: 1. There is a filling defect at the tip of the left atrial appendage that persists on the delayed imaging, concerning for thrombus. Recommend TEE for further evaluation   2. There is normal pulmonary vein drainage into the left atrium. Measurements as reported   3. The esophagus courses adjacent to ostium of right inferior pulmonary vein   4. Asymmetric hypertrophy measuring up to 26m in mid inferoseptum consistent with known hypertrophic cardiomyopathy   5.  Interatrial septal aneurysm with small PFO   6.  Dilated main pulmonary artery measuring 320m  7. Coronary calcium  score 338 (99th percentile for age/gender). The study was performed without use of NTG and insufficient for plaque evaluation. __________  Cardiac MRI 01/28/2020: IMPRESSION: 1.  Normal LV chamber size, with hyperdynamic function, LVEF 67%.   2.  Normal RV chamber size, RVEF 70%.   3. 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 Maximal wall thickness at the mid ventricular level is 19 mm.   4. Large area of dense delayed myocardial enhancement at the mid ventricular level in the area of maximal wall thickness. Visually appears > 15% of the myocardial mass.   5. Severe left atrial enlargement. Iatrogenic left to right atrial level shunt.   6.  No findings to suggests cardiac amyloidosis.  ECV 22%. __________  Coronary CTA with morphology 12/19/2019: IMPRESSION: 1. There is normal pulmonary vein drainage into the left atrium with ostial measurements above.   2. No LAA thombus.   3. The esophagus runs in the left atrial midline and is not in the proximity to any of the pulmonary veins.   4. 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 TEE would be recommended to exclude PFO/ASD.   5. Normal coronary origin. Right dominance. CAC score of 270 which is 99th percentile for age- and sex-matched controls.   6. Dilated RA with increased RA pressure.   7. Dilated pulmonary artery consistent with pulmonary hypertension.   8. Severe asymmetric hypertrophy of the ventricular septum (up to 30 mm) concerning for hypertrophic cardiomyopathy (reverse curvature) with chordal SAM. CMR recommended for better characterization and LGE assessment. __________  2D echo 07/31/2019: 1. Left ventricular ejection fraction, by visual estimation, is 60 to  65%. The left ventricle has normal function.  There is moderately increased  left ventricular  hypertrophy.   2. Left ventricular diastolic parameters are indeterminate.   3. Global right ventricle has normal systolic function.The right  ventricular size is normal. No increase in right ventricular wall  thickness.   4. Left atrial size was severely dilated.   5. Right atrial size was mildly dilated   6. Moderately elevated pulmonary artery systolic pressure.   7. Rhythm is atrial fibrillation.    EKG:  EKG is ordered today.  The EKG ordered today demonstrates ***  Recent Labs: 03/03/2022: ALT 26 03/07/2022: Magnesium 2.7 03/13/2022: BUN 30; Hemoglobin 8.5; Platelets 182; Potassium 4.0; Sodium 139 03/14/2022: Creatinine, Ser 0.82 03/17/2022: B Natriuretic Peptide 1,144.1  Recent Lipid Panel    Component Value Date/Time   CHOL 128 01/18/2022 0908   TRIG 65 01/18/2022 0908   HDL 50 01/18/2022 0908   CHOLHDL 2.6 01/18/2022 0908   CHOLHDL 3.3 11/13/2021 0600   VLDL 9 11/13/2021 0600   LDLCALC 64 01/18/2022 0908    PHYSICAL EXAM:    VS:  There were no vitals taken for this visit.  BMI: There is no height or weight on file to calculate BMI.  Physical Exam  Wt Readings from Last 3 Encounters:  03/14/22 161 lb 6 oz (73.2 kg)  03/03/22 144 lb (65.3 kg)  03/02/22 147 lb 4.8 oz (66.8 kg)     ASSESSMENT & PLAN:   ***   {Are you ordering a CV Procedure (e.g. stress test, cath, DCCV, TEE, etc)?   Press F2        :[id number]}     Disposition: F/u with Dr. Rockey Situ or an APP in ***, and EP as directed.   Medication Adjustments/Labs and Tests Ordered: Current medicines are reviewed at length with the patient today.  Concerns regarding medicines are outlined above. Medication changes, Labs and Tests ordered today are summarized above and listed in the Patient Instructions accessible in Encounters.   Signed, Christell Faith, PA-C 03/24/2022 9:48 AM     [place] [address] [place] [address] [place], [place] [address] [phone]

## 2022-03-24 NOTE — ED Notes (Signed)
Pt in xray

## 2022-03-24 NOTE — ED Notes (Signed)
Pt refused DC vitals and medication. Pt states she had to pick her grandson up from daycare and she would take her meds at home and check on her vitals. Pt states she gave verbal consent to DC.

## 2022-03-24 NOTE — Procedures (Signed)
PROCEDURE SUMMARY:  Successful image-guided right thoracentesis. Yielded 750 mL of serosanguinous fluid. Pt tolerated procedure well. No immediate complications. EBL = trace   Specimen was not sent for labs. CXR ordered.  Please see imaging section of Epic for full dictation.  Lura Em PA-C 03/24/2022 4:28 PM

## 2022-03-24 NOTE — ED Provider Notes (Signed)
I assumed care of this patient approximately 1530.  Please see outgoing providers note for full details regarding patient's initial evaluation and assessment.  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  She is found to have a right-sided pleural effusion thought to be the cause of her symptoms.  She is also noted of elevated troponin although does not have any chest pain and was noted to downtrend on recheck.  She is pending thoracentesis on reassessment my signout.  She does have a history of chronic low blood pressures and on midodrine.  Plan is to increase this from 2 times a day to 3 times a day.  Advised her to follow-up with her PCP and cardiologist.  She is feeling much better on my reassessment and wishes to go home.  Blood pressure is slightly improved.  Discharged in stable condition after I consulted with on-call cardiologist Dr. Fletcher Anon who is aware of patient's elevated troponin and borderline pressures and AKI and feels that patient is stable for discharge with close outpatient cardiology and PCP follow-up.Marland Kitchen   Lucrezia Starch, MD 03/24/22 [id number]

## 2022-03-24 NOTE — ED Triage Notes (Signed)
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 Pt c/o upper chest pressure with SOB, pt has 3+ pitting edema in LE , worse in the LLE. Pt is in NAD on arrival in personal wheel chair. Pt is a/ox4 on arrival

## 2022-03-24 NOTE — ED Provider Notes (Signed)
[facility] Provider Note    Event Date/Time   First MD Initiated Contact with Patient 03/24/22 1012     (approximate)   History   Chief Complaint: Shortness of Breath, Chest Pain, and Post -Op Open Heart Surgery   HPI  Hannah Zimmerman is a 51 y.o. 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  No orthopnea, no PND, no dyspnea on exertion.  Shortness of breath is improved with standing and walking.  She has had bilateral lower extremity edema since surgery, on a course of Lasix which finished yesterday.  Reports the edema is persistent and continuing to increase today.  Denies any significant chest pain or shortness of breath.  No dizziness, no exertional symptoms.  No fever.  No cough.  Patient also complains of pain in the left medial midfoot ever since twisting her foot on a misstep at home about a week ago.     Physical Exam   Triage Vital Signs: ED Triage Vitals  Enc Vitals Group     BP 03/24/22 1004 100/68     Pulse Rate 03/24/22 1004 62     Resp 03/24/22 1004 18     Temp 03/24/22 1004 98.1 F (36.7 C)     Temp Source 03/24/22 1004 Oral     SpO2 03/24/22 1004 98 %     Weight --      Height --      Head Circumference --      Peak Flow --      Pain Score 03/24/22 1005 2     Pain Loc --      Pain Edu? --      Excl. in [place]? --     Most recent vital signs: Vitals:   03/24/22 1418 03/24/22 1430  BP: (!) 88/53 (!) 87/64  Pulse: (!) 43 (!) 58  Resp: 14 15  Temp:    SpO2: 98% 99%    General: Awake, no distress.  CV:  Good peripheral perfusion.  Regular rate rhythm Resp:  Normal effort.  Diminished breath sounds at the right base.  No wheezes, no crackles Abd:  No distention.  Soft nontender Other:  1+ pitting edema bilateral lower extremities.   Symmetric.  No calf tenderness, no inflammatory skin changes.  Negative Homans' sign.  There is also some tenderness of the distal first metatarsal medially.   ED Results / Procedures / Treatments   Labs (all labs ordered are listed, but only abnormal results are displayed) Labs Reviewed  BASIC METABOLIC PANEL - Abnormal; Notable for the following components:      Result Value   Glucose, Bld 108 (*)    BUN 23 (*)    Creatinine, Ser 1.52 (*)    Calcium 8.6 (*)    GFR, Estimated 42 (*)    All other components within normal limits  CBC - Abnormal; Notable for the following components:   RBC 2.96 (*)    Hemoglobin 9.8 (*)    HCT 30.5 (*)    MCV 103.0 (*)    RDW 16.9 (*)    Platelets 492 (*)    All other components within normal limits  BRAIN NATRIURETIC PEPTIDE - Abnormal; Notable for the following components:   B Natriuretic Peptide 877.4 (*)    All other components within  normal limits  TROPONIN I (HIGH SENSITIVITY) - Abnormal; Notable for the following components:   Troponin I (High Sensitivity) 1,848 (*)    All other components within normal limits  TROPONIN I (HIGH SENSITIVITY) - Abnormal; Notable for the following components:   Troponin I (High Sensitivity) 1,793 (*)    All other components within normal limits     EKG Interpreted by me Junctional rhythm, rate of 48.  Normal axis, normal intervals.  Normal QRS and ST segments.  Diffuse anterolateral T wave inversions.  No significant change compared to previous EKG on March 12, 2022.   RADIOLOGY Chest x-ray interpreted by me, shows right pleural effusion, moderate size.  This is similar to previous chest x-ray showing effusion that patient had prior to having thoracentesis on June 27.  No pneumothorax.  Radiology report reviewed.   PROCEDURES:  .1-3 Lead EKG Interpretation  Performed by: Carrie Mew, MD Authorized by: Carrie Mew, MD     Interpretation: abnormal     ECG rate:  50   ECG rate  assessment: bradycardic     Rhythm: sinus bradycardia     Ectopy: none     Conduction: normal      MEDICATIONS ORDERED IN ED: Medications  midodrine (PROAMATINE) tablet 10 mg (has no administration in time range)  furosemide (LASIX) injection 20 mg (20 mg Intravenous Given 03/24/22 1143)     IMPRESSION / MDM / ASSESSMENT AND PLAN / ED COURSE  I reviewed the triage vital signs and the nursing notes.                              Differential diagnosis includes, but is not limited to, pleural effusion, pulmonary edema, pneumonia, non-STEMI, electrolyte abnormality  Patient's presentation is most consistent with acute presentation with potential threat to life or bodily function.  Patient presents with shortness of breath feeling like her recent pleural effusion.  Vital signs are normal, she is not in distress.  Low suspicion for ACS, PE, dissection, pericardial effusion, sepsis.  EKG is unchanged.  Chest x-ray shows right pleural effusion, explaining her symptoms.  We will follow-up serum labs, plan to give IV Lasix for better diuresis and continue her Lasix over the weekend until she follows up with cardiology on Monday in 3 days at her scheduled appointment.  I will offer her thoracentesis by IR from the ED.   Clinical Course as of 03/24/22 1624  Fri Mar 24, 2022  1133 Bedside point-of-care ultrasound performed by me.  No pericardial effusion.  There is a sizable right pleural effusion on ultrasound.  She is on Eliquis.  We will request IR thoracentesis. [PS]  1405 Serial troponin mostly stable and downtrending, consistent with sequela of recent heart surgery.  Doubt ACS. [PS]    Clinical Course User Index [PS] Carrie Mew, MD    ----------------------------------------- 4:23 PM on 03/24/2022 ----------------------------------------- Patient has persistent borderline blood pressure as well as slow heart rate, which patient states is chronic for her and the reason for her  midodrine.  She is on 10 mg 2 times a day, which may allow for midday hypotension.  She denies any significant chest pain, only mild shortness of breath which is attributable to the right pleural effusion.  No exertional symptoms.    Case discussed with Dr. Fletcher Anon of cardiology who agrees that presentation appears mostly nonacute.  After thoracentesis she could be discharged to keep her cardiology appointment in 3  days on Monday.  Agrees with continuing daily Lasix and increasing midodrine to 10 mg 3 times daily in the meantime.  Agrees that troponin measurements are postoperative.   FINAL CLINICAL IMPRESSION(S) / ED DIAGNOSES   Final diagnoses:  Recurrent right pleural effusion  Elevated troponin level not due to acute coronary syndrome  Peripheral edema     Rx / DC Orders   ED Discharge Orders          Ordered    midodrine (PROAMATINE) 10 MG tablet  3 times daily        03/24/22 1620    furosemide (LASIX) 20 MG tablet  Daily        03/24/22 1620             Note:  This document was prepared using Dragon voice recognition software and may include unintentional dictation errors.   Carrie Mew, MD 03/24/22 [phone]

## 2022-03-24 NOTE — Discharge Instructions (Signed)
Please keep your appointment with cardiology in 3 days for continued monitoring of your symptoms.  I have sent a new prescription for midodrine to take 10 mg 3 times a day for better control of your low blood pressures.  Also continue taking Lasix 20 mg once a day until you see cardiology.

## 2022-03-24 NOTE — ED Notes (Signed)
Pt taken to US

## 2022-03-27 ENCOUNTER — Ambulatory Visit: Payer: Commercial Managed Care - HMO | Admitting: Physician Assistant

## 2022-03-28 ENCOUNTER — Other Ambulatory Visit: Payer: Self-pay | Admitting: Thoracic Surgery (Cardiothoracic Vascular Surgery)

## 2022-03-28 DIAGNOSIS — Z8679 Personal history of other diseases of the circulatory system: Secondary | ICD-10-CM

## 2022-04-03 ENCOUNTER — Ambulatory Visit (HOSPITAL_COMMUNITY)
Admission: RE | Admit: 2022-04-03 | Discharge: 2022-04-03 | Disposition: A | Discharge status: Home / Self Care | Payer: Commercial Managed Care - HMO | Source: Ambulatory Visit | Attending: Physician Assistant | Admitting: Physician Assistant

## 2022-04-03 ENCOUNTER — Ambulatory Visit
Admission: RE | Admit: 2022-04-03 | Discharge: 2022-04-03 | Disposition: A | Discharge status: Home / Self Care | Payer: Commercial Managed Care - HMO | Source: Ambulatory Visit | Attending: Thoracic Surgery (Cardiothoracic Vascular Surgery) | Admitting: Thoracic Surgery (Cardiothoracic Vascular Surgery)

## 2022-04-03 ENCOUNTER — Telehealth: Payer: Self-pay

## 2022-04-03 ENCOUNTER — Other Ambulatory Visit: Payer: Self-pay | Admitting: *Deleted

## 2022-04-03 ENCOUNTER — Ambulatory Visit: Payer: Commercial Managed Care - HMO | Admitting: Physician Assistant

## 2022-04-03 ENCOUNTER — Ambulatory Visit (INDEPENDENT_AMBULATORY_CARE_PROVIDER_SITE_OTHER): Payer: Self-pay | Admitting: Physician Assistant

## 2022-04-03 VITALS — BP 108/74 | HR 64 | Resp 18 | Ht 64.0 in | Wt 153.0 lb

## 2022-04-03 DIAGNOSIS — Z8679 Personal history of other diseases of the circulatory system: Secondary | ICD-10-CM

## 2022-04-03 DIAGNOSIS — R7989 Other specified abnormal findings of blood chemistry: Secondary | ICD-10-CM

## 2022-04-03 DIAGNOSIS — M7989 Other specified soft tissue disorders: Secondary | ICD-10-CM

## 2022-04-03 DIAGNOSIS — Z9889 Other specified postprocedural states: Secondary | ICD-10-CM

## 2022-04-03 DIAGNOSIS — Z951 Presence of aortocoronary bypass graft: Secondary | ICD-10-CM

## 2022-04-03 NOTE — Telephone Encounter (Signed)
Patient's vascular ultrasound came back negative for DVT, patient's left leg. Hannah Pinks, PA made aware.

## 2022-04-03 NOTE — Patient Instructions (Addendum)
You may continue to gradually increase your physical activity as tolerated.  Refrain from any heavy lifting or strenuous use of your arms and shoulders until at least 8 weeks from the time of your surgery, and avoid activities that cause increased pain in your chest on the side of your surgical incision.  Otherwise, you may continue to increase activities without any particular limitations.  Increase the intensity and duration of physical activity gradually.

## 2022-04-03 NOTE — Progress Notes (Signed)
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 Roxan Hockey on 03/06/2022. The patient's early postoperative recovery while in the hospital was notable for a slow junctional rhythm. The junctional rhythm persisted with no observed recovery of sinus node function so EP was consulted.  Dr. Curt Bears felt she likely had a component of sick sinus syndrome. Amiodarone was stopped and Zio patch monitoring was arranged prior to discharge. Of note, she presented to the ED on 07/07 with complaints of increased LE edema, chest pressure, and shortness of breath. BNP was 877 and creatinine was 1.52.She was given IV Lasix and right thoracentesis was done which removed 750 ml of fluid. She was discharged. Since hospital discharge the patient reports persistent swelling of left lower leg and ankle.   Current Outpatient Medications  Medication Sig Dispense Refill   acetaminophen (TYLENOL) 500 MG tablet Take 1,000 mg by mouth every 6 (six) hours as needed (pain/headaches.).     ALPRAZolam (XANAX) 0.25 MG tablet Take 0.125 mg by mouth daily as needed for anxiety.     aspirin EC 81 MG tablet Take 1 tablet (81 mg total) by mouth daily. Swallow whole. 30 tablet 12   atorvastatin (LIPITOR) 40 MG tablet Take 1 tablet (40 mg total) by mouth at bedtime. 90 tablet 2   b complex vitamins capsule Take 1 capsule by mouth daily.     Cholecalciferol (VITAMIN D3 PO) Take 1 tablet by mouth in the morning.     ELIQUIS 5 MG TABS tablet TAKE 1 TABLET(5 MG) BY MOUTH TWICE DAILY 180 tablet 1   escitalopram (LEXAPRO) 10 MG tablet Take 10 mg by mouth daily.     furosemide (LASIX) 20 MG tablet Take 1 tablet (20 mg total) by mouth daily for 5 days. 5 tablet 0   midodrine (PROAMATINE) 10 MG tablet Take 1 tablet (10 mg total)  by mouth 3 (three) times daily. 90 tablet 0   traMADol (ULTRAM) 50 MG tablet Take 1 tablet (50 mg total) by mouth every 6 (six) hours as needed for moderate pain. 28 tablet 0  Vital Signs: Vitals:   04/03/22 1525  BP: 108/74  Pulse: 64  Resp: 18  SpO2: 100%      Physical Exam: CV-RRR Pulmonary-Slightly diminished right base Abdomen-Soft, non tender, bowel sounds present Extremities-+1 right ankle edema;+3 LLE edema Wound-Clean and dry. Eschar at chest tube wound  Diagnostic Tests:   CLINICAL DATA:  Post Maze operation for atrial fibrillation.   EXAM: CHEST - 2 VIEW   COMPARISON:  03/24/2022; 03/14/2022   FINDINGS: Unchanged cardiac silhouette and mediastinal contours post median sternotomy and atrial clip device. Unchanged small layering right-sided pleural effusion and associated right basilar heterogeneous opacities. The left lung is well aerated. No left-sided pleural effusion. Improved aeration of the lungs. No evidence of edema. No pneumothorax. No acute osseous abnormalities. Degenerative change of the lumbar spine is suspected though incompletely evaluated.   IMPRESSION: Improved aeration of the lungs with otherwise unchanged small layering right-sided effusion and associated right basilar opacities, likely atelectasis.     Electronically Signed   By: Sandi Mariscal M.D.   On: 04/03/2022 15:26    Impression and Plan: Regarding LLE swelling, will  check duplex US as much more swollen than right. We discussed findings on today's chest x ray. She will take Lasix 20 mg with potassium supplement for the next couple of days then PRN. Patient was instructed to continue with sternal precautions (I.e. no lifting more than 10 pounds) for the next 4 weeks. Patient no longer drives since she had a stroke. We will discuss participation in cardiac rehab at next office visit. At patient request, will check BMET as kidney function was 1.52 in ED 07/07. She will return to office  in one week with PA/LAT CXR for further evaluation.  She will return to see Dr. Roxan Hockey in 1 week. She also has an appointment to see Dr. Curt Bears on 08/04.     Nani Skillern, PA-C [facility] [phone]

## 2022-04-10 ENCOUNTER — Other Ambulatory Visit: Payer: Self-pay | Admitting: Thoracic Surgery (Cardiothoracic Vascular Surgery)

## 2022-04-10 DIAGNOSIS — Z9889 Other specified postprocedural states: Secondary | ICD-10-CM

## 2022-04-11 ENCOUNTER — Ambulatory Visit
Admission: RE | Admit: 2022-04-11 | Discharge: 2022-04-11 | Disposition: A | Discharge status: Home / Self Care | Payer: Commercial Managed Care - HMO | Source: Ambulatory Visit | Attending: Thoracic Surgery (Cardiothoracic Vascular Surgery) | Admitting: Thoracic Surgery (Cardiothoracic Vascular Surgery)

## 2022-04-11 ENCOUNTER — Ambulatory Visit (INDEPENDENT_AMBULATORY_CARE_PROVIDER_SITE_OTHER): Payer: Self-pay | Admitting: Thoracic Surgery (Cardiothoracic Vascular Surgery)

## 2022-04-11 VITALS — BP 110/74 | HR 68 | Resp 20 | Ht 64.0 in | Wt 148.0 lb

## 2022-04-11 DIAGNOSIS — Z8679 Personal history of other diseases of the circulatory system: Secondary | ICD-10-CM

## 2022-04-11 DIAGNOSIS — Z09 Encounter for follow-up examination after completed treatment for conditions other than malignant neoplasm: Secondary | ICD-10-CM

## 2022-04-11 DIAGNOSIS — D151 Benign neoplasm of heart: Secondary | ICD-10-CM

## 2022-04-11 MED ORDER — PREDNISONE 10 MG (21) PO TBPK: ORAL_TABLET | ORAL | 0 refills | Status: AC

## 2022-04-11 NOTE — Progress Notes (Signed)
[address]       [place] [address]             [phone]       HPI: [name] returns for scheduled postoperative follow-up visit after recent resection of aortic valve tumor and Maze procedure.  Hannah Zimmerman is a 51 year old woman who has a past history significant for stroke, papillary fibroelastoma of aortic valve, persistent atrial fibrillation, congestive heart failure, morbid obesity status post sleeve gastrectomy, pulmonary hypertension, and tobacco use.  She underwent resection of a papillary fibroelastoma of the aortic valve, Maze procedure, and left atrial appendage clipping on 03/06/2022.  She had a slow junctional rhythm postoperatively but did not require pacemaker.  She did have a Zio patch.  She presented to the emergency room on 03/24/2022 with some shortness of breath.  Chest x-ray showed a right pleural effusion.  She had thoracentesis.  That drained about 750 mL of fluid.  She saw Lars Pinks last week with complaints of swelling in her left foot.  A duplex showed no evidence of DVT.  She is on Eliquis.  In the interim since her last visit she is been doing reasonably well.  She is not having much incisional pain.  She does take Tylenol and occasionally ibuprofen.  She still has some swelling in her left foot but it has improved.  She is trying to keep it elevated.  Past Medical History:  Diagnosis Date   (HFpEF) heart failure with preserved ejection fraction ([place])    a. 07/2019 Echo: EF 60-65%, mod LVH. Sev dil LA, mildly dil RA. Mod elev PASP; b. 07/2020 TEE: EF 50-55%, no rwma, sev conc LVH. Nl RV fxn. Mod dil RA. Mild-mod MR, mod TR.   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 07/2020 Cardiac CT: Cor Ca2+ = 338 (99th %'ile).   Complication of anesthesia 20 years ago   a. spinal with first c section went too high stopped breathing, low bp with 2nd c section, no further issues with anesthesia   Heart murmur    a.  07/2019 Echo: no significant valvular dzs; b. 07/2020 TEE: Mild-mod MR, mod TR.   History of hiatal hernia    Hypertrophic cardiomyopathy ([place])    a. 07/2019 Echo: Mod LVH; b. 01/2020 cMRI: EF 67%, HCM w/o obstruction. Max wall thickness 58m. Sev LAE w/ L->R atrial level shunt. Large area of LGE @ mid-ventricular level in area of max wall thickness; c. 07/2020 Cardiac CT: Asymm hypertrophy up to 114min mid inferoseptum consistent w/ known HCM.   Morbid obesity ([place]   a. 09/2014 s/p gastric bypass.   Persistent atrial fibrillation ([place]   a. Dx 07/2019-->CHA2DS2VASc = 2 (diast CHF/Fem)-->Eliquis; b. 09/2018 s/p DCCV (150J (biphasic) x 1); c. 12/2019 RFCA/PVI; d. 07/2020 repeat RFCA/PVI.   Rash    both arms from old bed bug bites, healing   [facility]   Tobacco abuse    Typical atrial flutter ([place]   a. 12/2019 s/p RFCA.     Current Outpatient Medications  Medication Sig Dispense Refill   acetaminophen (TYLENOL) 500 MG tablet Take 1,000 mg by mouth every 6 (six) hours as needed (pain/headaches.).     aspirin EC 81 MG tablet Take 1 tablet (81 mg total) by mouth daily. Swallow whole. 30 tablet 12   atorvastatin (LIPITOR) 40 MG tablet Take 1 tablet (40 mg total) by mouth at  bedtime. 90 tablet 2   b complex vitamins capsule Take 1 capsule by mouth daily.     Cholecalciferol (VITAMIN D3 PO) Take 1 tablet by mouth in the morning.     ELIQUIS 5 MG TABS tablet TAKE 1 TABLET(5 MG) BY MOUTH TWICE DAILY 180 tablet 1   midodrine (PROAMATINE) 10 MG tablet Take 1 tablet (10 mg total) by mouth 3 (three) times daily. 90 tablet 0   predniSONE (STERAPRED UNI-PAK 21 TAB) 10 MG (21) TBPK tablet Take 6 tablets (60 mg total) by mouth daily for 1 day, THEN 5 tablets (50 mg total) daily for 1 day, THEN 4 tablets (40 mg total) daily for 1 day, THEN 3 tablets (30 mg total) daily for 1 day, THEN 2 tablets (20 mg total) daily for 1 day, THEN 1 tablet (10 mg total) daily for 1 day. 21 tablet 0   traMADol (ULTRAM) 50  MG tablet Take 1 tablet (50 mg total) by mouth every 6 (six) hours as needed for moderate pain. 28 tablet 0   ALPRAZolam (XANAX) 0.25 MG tablet Take 0.125 mg by mouth daily as needed for anxiety. (Patient not taking: Reported on 04/03/2022)     escitalopram (LEXAPRO) 10 MG tablet Take 10 mg by mouth daily. (Patient not taking: Reported on 04/03/2022)     furosemide (LASIX) 20 MG tablet Take 1 tablet (20 mg total) by mouth daily for 5 days. 5 tablet 0   No current facility-administered medications for this visit.    Physical Exam BP 110/74   Pulse 68   Resp 20   Ht '5\' 4"'$  (1.626 m)   Wt 148 lb (67.1 kg)   SpO2 100% Comment: RA  BMI 25.68 kg/m  51 year old woman in no acute distress Alert and oriented x3  Cardiac regular rate and rhythm no murmur Lungs diminished at right base otherwise clear Incision clean dry and intact, sternum stable Edema left foot greater than right  Diagnostic Tests: CHEST - 2 VIEW   COMPARISON:  04/03/2022   FINDINGS: Midline sternotomy. Atrial clip noted. Persistent blunting of the RIGHT costophrenic angle. Mild RIGHT basilar atelectasis. Mild elevation of the RIGHT hemidiaphragm. LEFT lung clear. No pneumothorax.   IMPRESSION: 1. Minimal interval change. 2. Persistent blunting of the RIGHT costophrenic angle.     Electronically Signed   By: Suzy Bouchard M.D.   On: 04/11/2022 08:59 I personally reviewed the chest x-ray images.  There is still some opacity at the right base.  Possible small amount of fluid, possibly mild elevation of the right hemidiaphragm.  Impression: Hannah Zimmerman is a 51 year old woman with a past history significant for stroke, papillary fibroelastoma of aortic valve, persistent atrial fibrillation, congestive heart failure, morbid obesity status post sleeve gastrectomy, pulmonary hypertension, and tobacco use.  She underwent resection of a papillary fibroelastoma of the aortic valve, Maze procedure, and left atrial  appendage clipping on 03/06/2022.  She still has some opacity at the right base.  There likely is a small amount of fluid but there may also be some mild elevation of the right hemidiaphragm.  There is not enough fluid to warrant thoracentesis.  I am going to try a prednisone taper to see if that helps any.  Her left lower extremity edema is a bit mysterious.  She did have an elevated BNP when she was seen in the emergency room.  Fluid retention is not uncommon after a maze particularly with a left atrial appendage clip.  It is improving and there  was no DVT on ultrasound.  We will monitor and she was encouraged to keep her legs elevated when possible.  She anything over 10 pounds for another week.  Other than that activities are unrestricted from my standpoint.  Can resume driving when cleared by neurology.  Plan: Prednisone taper Return in 3 weeks with PA and lateral chest x-ray  Melrose Nakayama, MD [facility] [phone]

## 2022-04-12 LAB — BASIC METABOLIC PANEL
BUN/Creatinine Ratio: 12 (calc) (ref 6–22)
BUN: 16 mg/dL (ref 7–25)
CO2: 31 mmol/L (ref 20–32)
Calcium: 9 mg/dL (ref 8.6–10.4)
Chloride: 103 mmol/L (ref 98–110)
Creat: 1.31 mg/dL — ABNORMAL HIGH (ref 0.50–1.03)
Glucose, Bld: 66 mg/dL (ref 65–99)
Potassium: 4.2 mmol/L (ref 3.5–5.3)
Sodium: 143 mmol/L (ref 135–146)

## 2022-04-13 ENCOUNTER — Other Ambulatory Visit: Payer: Self-pay

## 2022-04-13 NOTE — Telephone Encounter (Signed)
Pt aware she does not need to be on ASA & Eliquis, advised to stop ASA per Dr. Curt Bears. Will ask refills to send Rx back denying and letting pharmacy know to remove ASA from  their list.

## 2022-04-13 NOTE — Telephone Encounter (Signed)
[facility] mail order pharmacy is requesting a refill on aspirin 81 mg tablets. Would Dr. Curt Bears like to refill this medication? Please address

## 2022-04-13 NOTE — Telephone Encounter (Signed)
Roena Malady, RN, can you take aspirin off of pt's medication list, if pt is not supposed to take anymore? Thanks

## 2022-04-20 ENCOUNTER — Other Ambulatory Visit: Payer: Self-pay | Admitting: Family Medicine

## 2022-04-20 ENCOUNTER — Ambulatory Visit
Admission: RE | Admit: 2022-04-20 | Discharge: 2022-04-20 | Disposition: A | Discharge status: Home / Self Care | Payer: Commercial Managed Care - HMO | Source: Ambulatory Visit | Attending: Family Medicine | Admitting: Family Medicine

## 2022-04-20 ENCOUNTER — Other Ambulatory Visit (HOSPITAL_COMMUNITY): Payer: Self-pay | Admitting: Family Medicine

## 2022-04-20 DIAGNOSIS — R109 Unspecified abdominal pain: Secondary | ICD-10-CM | POA: Insufficient documentation

## 2022-04-20 DIAGNOSIS — K5900 Constipation, unspecified: Secondary | ICD-10-CM | POA: Diagnosis present

## 2022-04-21 ENCOUNTER — Encounter: Payer: Self-pay | Admitting: Cardiology

## 2022-04-21 ENCOUNTER — Ambulatory Visit (INDEPENDENT_AMBULATORY_CARE_PROVIDER_SITE_OTHER): Payer: Commercial Managed Care - HMO | Admitting: Cardiology

## 2022-04-21 VITALS — BP 112/74 | HR 72 | Ht 64.0 in | Wt 156.4 lb

## 2022-04-21 DIAGNOSIS — D6869 Other thrombophilia: Secondary | ICD-10-CM | POA: Diagnosis not present

## 2022-04-21 DIAGNOSIS — I4819 Other persistent atrial fibrillation: Secondary | ICD-10-CM

## 2022-04-21 MED ORDER — POTASSIUM CHLORIDE CRYS ER 20 MEQ PO TBCR: EXTENDED_RELEASE_TABLET | ORAL | 0 refills | Status: DC

## 2022-04-21 MED ORDER — FUROSEMIDE 40 MG PO TABS: ORAL_TABLET | ORAL | 0 refills | Status: DC

## 2022-04-21 NOTE — Progress Notes (Signed)
Electrophysiology Office Note   Date:  04/21/2022   ID:  Hannah Zimmerman, DOB 1971-04-11, MRN [id number]  PCP:  Donnamarie Rossetti, PA-C  Cardiologist:  Rockey Situ Primary Electrophysiologist:  Tyrie Porzio Meredith Leeds, MD    Chief Complaint: AF   History of Present Illness: Hannah Zimmerman is a 51 y.o. female who is being seen today for the evaluation of AF at the request of Donnamarie Rossetti,*. Presenting today for electrophysiology evaluation.  She has a history significant for heart failure with preserved ejection fraction, morbid obesity status post gastric bypass, persistent atrial fibrillation, tobacco abuse.  She is status post atrial fibrillation ablation 12/24/2019 with repeat ablation 08/11/2020.  She presented to [facility] with atrial fibrillation.  She had a TEE and cardioversion.  Post cardioversion she had a cryptogenic stroke.  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  She is status post fibroblastoma removal, surgical maze, left atrial appendage clipping 03/06/2022.   Today, denies symptoms of palpitations, chest pain,  orthopnea, PND, lower extremity edema, claudication, dizziness, presyncope, syncope, bleeding, or neurologic sequela. The patient is tolerating medications without difficulties.  She presents today feeling fatigued and short of breath.  She had a CT scan performed yesterday that showed significant anasarca.  She was prescribed Lasix by her primary physician to take over the next 2 days.  She feels that she has not urinated quite a bit and feels mildly improved.   Past Medical History:  Diagnosis Date   (HFpEF) heart failure with preserved ejection fraction ([place])    a. 07/2019 Echo: EF 60-65%, mod LVH. Sev dil LA, mildly dil RA. Mod elev PASP; b. 07/2020 TEE: EF 50-55%, no rwma, sev conc LVH. Nl RV fxn. Mod dil RA. Mild-mod MR, mod TR.   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 07/2020 Cardiac CT: Cor Ca2+ = 338 (99th %'ile).    Complication of anesthesia 20 years ago   a. spinal with first c section went too high stopped breathing, low bp with 2nd c section, no further issues with anesthesia   Heart murmur    a. 07/2019 Echo: no significant valvular dzs; b. 07/2020 TEE: Mild-mod MR, mod TR.   History of hiatal hernia    Hypertrophic cardiomyopathy ([place])    a. 07/2019 Echo: Mod LVH; b. 01/2020 cMRI: EF 67%, HCM w/o obstruction. Max wall thickness 45m. Sev LAE w/ L->R atrial level shunt. Large area of LGE @ mid-ventricular level in area of max wall thickness; c. 07/2020 Cardiac CT: Asymm hypertrophy up to 135min mid inferoseptum consistent w/ known HCM.   Morbid obesity ([place]   a. 09/2014 s/p gastric bypass.   Persistent atrial fibrillation ([place]   a. Dx 07/2019-->CHA2DS2VASc = 2 (diast CHF/Fem)-->Eliquis; b. 09/2018 s/p DCCV (150J (biphasic) x 1); c. 12/2019 RFCA/PVI; d. 07/2020 repeat RFCA/PVI.   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 12/2019 s/p RFCA.   Past Surgical History:  Procedure Laterality Date   ABDOMINAL HYSTERECTOMY     ANKLE SURGERY     left x 2   ATRIAL FIBRILLATION ABLATION N/A 12/24/2019   Procedure: ATRIAL FIBRILLATION ABLATION;  Surgeon: CaConstance HawMD;  [facility];  Service: Cardiovascular;  Laterality: N/A;   ATRIAL FIBRILLATION ABLATION N/A 08/11/2020   Procedure: ATRIAL FIBRILLATION  ABLATION;  Surgeon: Constance Haw, MD;  Location: [place] [facility];  Service: Cardiovascular;  Laterality: N/A;   CARDIOVERSION N/A 09/24/2019   Procedure: CARDIOVERSION;  Surgeon: Minna Merritts, MD;  Location: [place] [facility];  Service: Cardiovascular;  Laterality: N/A;   CARDIOVERSION N/A 10/17/2019   Procedure: CARDIOVERSION;  Surgeon: Minna Merritts, MD;  Location: [facility];  Service: Cardiovascular;  Laterality: N/A;   CARDIOVERSION N/A 11/08/2021   Procedure: CARDIOVERSION;  Surgeon:  Minna Merritts, MD;  Location: [facility];  Service: Cardiovascular;  Laterality: N/A;   CESAREAN SECTION     x 3   CLIPPING OF ATRIAL APPENDAGE N/A 03/06/2022   Procedure: CLIPPING OF ATRIAL APPENDAGE USING ATRICURE  CLIP SIZE 45MM;  Surgeon: Melrose Nakayama, MD;  Location: [place];  Service: Open Heart Surgery;  Laterality: N/A;   EXCISION OF ATRIAL MYXOMA N/A 03/06/2022   Procedure: RESECTION OF AORTIC VALVE TUMOR;  Surgeon: Melrose Nakayama, MD;  Location: [place];  Service: Open Heart Surgery;  Laterality: N/A;   HERNIA REPAIR     x2 one was for sure a hiatal hernia   IR ANGIO EXTRACRAN SEL COM CAROTID INNOMINATE UNI L MOD SED  11/13/2021   IR CT HEAD LTD  11/13/2021   IR PERCUTANEOUS ART THROMBECTOMY/INFUSION INTRACRANIAL INC DIAG ANGIO  11/13/2021   IR THORACENTESIS ASP PLEURAL SPACE W/IMG GUIDE  03/14/2022   LAPAROSCOPIC GASTRIC SLEEVE RESECTION WITH HIATAL HERNIA REPAIR  08/17/2015   Procedure: LAPAROSCOPIC GASTRIC SLEEVE RESECTION WITH HIATAL HERNIA REPAIR;  Surgeon: Johnathan Hausen, MD;  Location: [facility];  Service: General;;   MAZE N/A 03/06/2022   Procedure: MAZE;  Surgeon: Melrose Nakayama, MD;  Location: [place];  Service: Open Heart Surgery;  Laterality: N/A;   RADIOLOGY WITH ANESTHESIA N/A 11/12/2021   Procedure: IR WITH ANESTHESIA;  Surgeon: Luanne Bras, MD;  Location: [place];  Service: Radiology;  Laterality: N/A;   TEE WITHOUT CARDIOVERSION N/A 08/11/2020   Procedure: TRANSESOPHAGEAL ECHOCARDIOGRAM (TEE);  Surgeon: Constance Haw, MD;  Location: [place] [facility];  Service: Cardiovascular;  Laterality: N/A;   TEE WITHOUT CARDIOVERSION N/A 11/08/2021   Procedure: TRANSESOPHAGEAL ECHOCARDIOGRAM (TEE);  Surgeon: Minna Merritts, MD;  Location: [facility];  Service: Cardiovascular;  Laterality: N/A;   TEE WITHOUT CARDIOVERSION N/A 03/06/2022   Procedure: TRANSESOPHAGEAL ECHOCARDIOGRAM (TEE);  Surgeon: Melrose Nakayama, MD;  Location: [place];  Service: Open  Heart Surgery;  Laterality: N/A;   TUBAL LIGATION     x 2   UPPER GI ENDOSCOPY  08/17/2015   Procedure: UPPER GI ENDOSCOPY;  Surgeon: Johnathan Hausen, MD;  Location: [facility];  Service: General;;     Current Outpatient Medications  Medication Sig Dispense Refill   acetaminophen (TYLENOL) 500 MG tablet Take 1,000 mg by mouth every 6 (six) hours as needed (pain/headaches.).     atorvastatin (LIPITOR) 40 MG tablet Take 1 tablet (40 mg total) by mouth at bedtime. 90 tablet 2   b complex vitamins capsule Take 1 capsule by mouth daily.     Cholecalciferol (VITAMIN D3 PO) Take 1 tablet by mouth in the morning.     ELIQUIS 5 MG TABS tablet TAKE 1 TABLET(5 MG) BY MOUTH TWICE DAILY 180 tablet 1   furosemide (LASIX) 40 MG tablet Take 1 tablet once daily for 7 days ,then stop 7 tablet 0   midodrine (PROAMATINE) 10 MG tablet Take 1 tablet (10 mg total) by mouth 3 (three) times daily. 90 tablet 0  potassium chloride SA (KLOR-CON M20) 20 MEQ tablet Take 1 tablet once daily, with Lasix, for 7 days 7 tablet 0   traMADol (ULTRAM) 50 MG tablet Take 1 tablet (50 mg total) by mouth every 6 (six) hours as needed for moderate pain. 28 tablet 0   ALPRAZolam (XANAX) 0.25 MG tablet Take 0.125 mg by mouth daily as needed for anxiety. (Patient not taking: Reported on 04/21/2022)     escitalopram (LEXAPRO) 10 MG tablet Take 10 mg by mouth daily. (Patient not taking: Reported on 04/21/2022)     No current facility-administered medications for this visit.    Allergies:   Codeine and Hydrocodone   Social History:  The patient  reports that she has been smoking cigarettes. She has a 7.75 pack-year smoking history. She has been exposed to tobacco smoke. She has never used smokeless tobacco. She reports current alcohol use of about 1.0 - 2.0 standard drink of alcohol per week. She reports that she does not use drugs.   Family History:  The patient's family history includes Diabetes in her mother; Heart failure in her mother;  Hypertension in her mother.   ROS:  Please see the history of present illness.   Otherwise, review of systems is positive for none.   All other systems are reviewed and negative.   PHYSICAL EXAM: VS:  BP 112/74   Pulse 72   Ht '5\' 4"'$  (1.626 m)   Wt 156 lb 6.4 oz (70.9 kg)   SpO2 99%   BMI 26.85 kg/m  , BMI Body mass index is 26.85 kg/m. GEN: Well nourished, well developed, in no acute distress  HEENT: normal  Neck: no JVD, carotid bruits, or masses Cardiac: RRR; no murmurs, rubs, or gallops, 2+ edema  Respiratory:  clear to auscultation bilaterally, normal work of breathing GI: soft, nontender, nondistended, + BS MS: no deformity or atrophy  Skin: warm and dry Neuro:  Strength and sensation are intact Psych: euthymic mood, full affect  EKG:  EKG is ordered today. Personal review of the ekg ordered shows junctional rhythm, rate 72  Recent Labs: 03/03/2022: ALT 26 03/07/2022: Magnesium 2.7 03/24/2022: B Natriuretic Peptide 877.4; Hemoglobin 9.8; Platelets 492 04/11/2022: BUN 16; Creat 1.31; Potassium 4.2; Sodium 143    Lipid Panel     Component Value Date/Time   CHOL 128 01/18/2022 0908   TRIG 65 01/18/2022 0908   HDL 50 01/18/2022 0908   CHOLHDL 2.6 01/18/2022 0908   CHOLHDL 3.3 11/13/2021 0600   VLDL 9 11/13/2021 0600   LDLCALC 64 01/18/2022 0908     Wt Readings from Last 3 Encounters:  04/21/22 156 lb 6.4 oz (70.9 kg)  04/11/22 148 lb (67.1 kg)  04/03/22 153 lb (69.4 kg)      Other studies Reviewed: Additional studies/ records that were reviewed today include: Cardiac MRI 01/28/2020 Review of the above records today demonstrates:  1.  Normal LV chamber size, with hyperdynamic function, LVEF 67%.   2.  Normal RV chamber size, RVEF 70%.   3. 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 Maximal wall thickness at the mid ventricular level is 19 mm.   4. Large area of dense delayed myocardial enhancement at the  mid ventricular level in the area of maximal wall thickness. Visually appears > 15% of the myocardial mass.   5. Severe left atrial enlargement. Iatrogenic left to right atrial level shunt.   6.  No findings to suggests cardiac amyloidosis.  ECV 22%.  TEE 11/08/21  1. Left ventricular ejection fraction, by estimation, is 50-55 %. The  left ventricle has normal function. The left ventricle has no regional  wall motion abnormalities. There is mild concentric left ventricular  hypertrophy.   2. Right ventricular systolic function is normal. The right ventricular  size is normal.   3. Left atrial size was severely dilated. No left atrial/left atrial  appendage thrombus was detected.   4. Right atrial size was moderately dilated.   5. The mitral valve is normal in structure. No evidence of mitral valve  regurgitation. No evidence of mitral stenosis.   6. Tricuspid valve regurgitation is moderate.   7. The aortic valve is normal in structure. Aortic valve regurgitation is  not visualized. Aortic valve sclerosis is present, with no evidence of  aortic valve stenosis.   8. The inferior vena cava is normal in size with greater than 50%  respiratory variability, suggesting right atrial pressure of 3 mmHg.   9. Agitated saline contrast bubble study was negative, with no evidence  of any interatrial shunt.   ASSESSMENT AND PLAN:  1.  Persistent atrial fibrillation/flutter: Currently on Eliquis 5 mg twice daily.  CHA2DS2-VASc of 3.  He is status post ablation x2 as well as surgical maze with left atrial appendage clipping.  She remains in junctional rhythm.  She has significant volume overload today.  We Krystin Keeven have her take Lasix 40 mg daily with K-Dur 20 mill equivalents daily.  If she continues to feel poorly despite the use of Lasix, Avia Merkley potentially discuss pacemaker implant.  2.  Tobacco abuse: Complete cessation encouraged  3.  Morbid obesity: Post gastric bypass  4.  Hypertrophic  cardiomyopathy: Found on cardiac MRI.  Has LGE.  Management per primary cardiology.  5.  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  Continue with current management.  6.  Secondary hypercoagulable state: Post left atrial appendage clipping and on Eliquis for atrial fibrillation as above.   Current medicines are reviewed at length with the patient today.   The patient does not have concerns regarding her medicines.  The following changes were made today: none  Labs/ tests ordered today include:  Orders Placed This Encounter  Procedures   EKG 12-Lead     Disposition:   FU with Medha Pippen 6 months  Signed, Malana Eberwein Meredith Leeds, MD  04/21/2022 4:14 PM     [address] [place] [id number] [phone] (office) [phone] (fax)

## 2022-04-21 NOTE — Patient Instructions (Addendum)
Medication Instructions:  Your physician has recommended you make the following change in your medication:  TAKE Lasix 40 mg daily for 7 days, then stop TAKE Potassium 20 mEq daily (with the Lasix) for 7 days, then stop  *If you need a refill on your cardiac medications before your next appointment, please call your pharmacy*   Lab Work: None ordered   Testing/Procedures: None ordered   Follow-Up: At [facility], you and your health needs are our priority.  As part of our continuing mission to provide you with exceptional heart care, we have created designated Provider Care Teams.  These Care Teams include your primary Cardiologist (physician) and Advanced Practice Providers (APPs -  Physician Assistants and Nurse Practitioners) who all work together to provide you with the care you need, when you need it.  Your next appointment:   10 day(s)  The format for your next appointment:   In Person  Provider:   You will see one of the following Advanced Practice Providers on your designated Care Team:   Tommye Standard, [place] Legrand Como "Jonni Sanger" Chalmers Cater, [place]     Thank you for choosing [facility]!!   Trinidad Curet, RN [phone]  Other Instructions   Important Information About Sugar

## 2022-05-01 ENCOUNTER — Other Ambulatory Visit: Payer: Self-pay | Admitting: Thoracic Surgery (Cardiothoracic Vascular Surgery)

## 2022-05-01 DIAGNOSIS — Z8679 Personal history of other diseases of the circulatory system: Secondary | ICD-10-CM

## 2022-05-02 ENCOUNTER — Encounter: Payer: Self-pay | Admitting: Student

## 2022-05-02 ENCOUNTER — Ambulatory Visit
Admission: RE | Admit: 2022-05-02 | Discharge: 2022-05-02 | Disposition: A | Discharge status: Home / Self Care | Payer: Commercial Managed Care - HMO | Source: Ambulatory Visit | Attending: Thoracic Surgery (Cardiothoracic Vascular Surgery) | Admitting: Thoracic Surgery (Cardiothoracic Vascular Surgery)

## 2022-05-02 ENCOUNTER — Ambulatory Visit (INDEPENDENT_AMBULATORY_CARE_PROVIDER_SITE_OTHER): Payer: Self-pay | Admitting: Thoracic Surgery (Cardiothoracic Vascular Surgery)

## 2022-05-02 ENCOUNTER — Ambulatory Visit (INDEPENDENT_AMBULATORY_CARE_PROVIDER_SITE_OTHER): Payer: Commercial Managed Care - HMO | Admitting: Student

## 2022-05-02 VITALS — BP 118/79 | HR 75 | Resp 18 | Ht 64.0 in | Wt 146.0 lb

## 2022-05-02 VITALS — BP 132/86 | HR 74 | Ht 64.0 in | Wt 146.6 lb

## 2022-05-02 DIAGNOSIS — Z8679 Personal history of other diseases of the circulatory system: Secondary | ICD-10-CM

## 2022-05-02 DIAGNOSIS — D151 Benign neoplasm of heart: Secondary | ICD-10-CM

## 2022-05-02 DIAGNOSIS — I4819 Other persistent atrial fibrillation: Secondary | ICD-10-CM

## 2022-05-02 DIAGNOSIS — Z09 Encounter for follow-up examination after completed treatment for conditions other than malignant neoplasm: Secondary | ICD-10-CM

## 2022-05-02 DIAGNOSIS — Z9889 Other specified postprocedural states: Secondary | ICD-10-CM

## 2022-05-02 LAB — BASIC METABOLIC PANEL
BUN/Creatinine Ratio: 18 (ref 9–23)
BUN: 17 mg/dL (ref 6–24)
CO2: 26 mmol/L (ref 20–29)
Calcium: 9.4 mg/dL (ref 8.7–10.2)
Chloride: 100 mmol/L (ref 96–106)
Creatinine, Ser: 0.94 mg/dL (ref 0.57–1.00)
Glucose: 81 mg/dL (ref 70–99)
Potassium: 3.7 mmol/L (ref 3.5–5.2)
Sodium: 141 mmol/L (ref 134–144)
eGFR: 74 mL/min/{1.73_m2} (ref >59)

## 2022-05-02 NOTE — Patient Instructions (Signed)
Medication Instructions:  Your physician recommends that you continue on your current medications as directed. Please refer to the Current Medication list given to you today.  *If you need a refill on your cardiac medications before your next appointment, please call your pharmacy*   Lab Work: TODAY: BMET  If you have labs (blood work) drawn today and your tests are completely normal, you will receive your results only by: [place] (if you have MyChart) OR A paper copy in the mail If you have any lab test that is abnormal or we need to change your treatment, we will call you to review the results.   Follow-Up: At [facility], you and your health needs are our priority.  As part of our continuing mission to provide you with exceptional heart care, we have created designated Provider Care Teams.  These Care Teams include your primary Cardiologist (physician) and Advanced Practice Providers (APPs -  Physician Assistants and Nurse Practitioners) who all work together to provide you with the care you need, when you need it.   Your next appointment:   6 month(s)  The format for your next appointment:   In Person  Provider:   Allegra Lai, MD{

## 2022-05-02 NOTE — Progress Notes (Signed)
PCP:  Donnamarie Rossetti, PA-C Primary Cardiologist: [name], MD Electrophysiologist: Will Meredith Leeds, MD   Hannah Zimmerman is a 51 y.o. female seen today for Will Meredith Leeds, MD for routine electrophysiology followup.  Since last being seen in our clinic the patient reports doing better. Her symptoms of fatigue and SOB have improved with diuresis. She is about 10 lbs down from last visit. She has a "shooting" pain rarely at her CABG site, and a "tense" feeling that lasts most of the day. No exertional pain. Edema has resolved.  Past Medical History:  Diagnosis Date   (HFpEF) heart failure with preserved ejection fraction ([place])    a. 07/2019 Echo: EF 60-65%, mod LVH. Sev dil LA, mildly dil RA. Mod elev PASP; b. 07/2020 TEE: EF 50-55%, no rwma, sev conc LVH. Nl RV fxn. Mod dil RA. Mild-mod MR, mod TR.   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 07/2020 Cardiac CT: Cor Ca2+ = 338 (99th %'ile).   Complication of anesthesia 20 years ago   a. spinal with first c section went too high stopped breathing, low bp with 2nd c section, no further issues with anesthesia   Heart murmur    a. 07/2019 Echo: no significant valvular dzs; b. 07/2020 TEE: Mild-mod MR, mod TR.   History of hiatal hernia    Hypertrophic cardiomyopathy ([place])    a. 07/2019 Echo: Mod LVH; b. 01/2020 cMRI: EF 67%, HCM w/o obstruction. Max wall thickness 34m. Sev LAE w/ L->R atrial level shunt. Large area of LGE @ mid-ventricular level in area of max wall thickness; c. 07/2020 Cardiac CT: Asymm hypertrophy up to 14min mid inferoseptum consistent w/ known HCM.   Morbid [place] ([place]   a. 09/2014 s/p gastric bypass.   Persistent atrial fibrillation ([place]   a. Dx 07/2019-->CHA2DS2VASc = 2 (diast CHF/Fem)-->Eliquis; b. 09/2018 s/p DCCV (150J (biphasic) x 1); c. 12/2019 RFCA/PVI; d. 07/2020 repeat RFCA/PVI.   Rash    both arms from old bed bug bites, healing   [facility]   Tobacco abuse     Typical atrial flutter ([place]   a. 12/2019 s/p RFCA.   Past Surgical History:  Procedure Laterality Date   ABDOMINAL HYSTERECTOMY     ANKLE SURGERY     left x 2   ATRIAL FIBRILLATION ABLATION N/A 12/24/2019   Procedure: ATRIAL FIBRILLATION ABLATION;  Surgeon: CaConstance HawMD;  [facility];  Service: Cardiovascular;  Laterality: N/A;   ATRIAL FIBRILLATION ABLATION N/A 08/11/2020   Procedure: ATRIAL FIBRILLATION ABLATION;  Surgeon: CaConstance HawMD;  [facility];  Service: Cardiovascular;  Laterality: N/A;   CARDIOVERSION N/A 09/24/2019   Procedure: CARDIOVERSION;  Surgeon: GoMinna MerrittsMD;  [place] [facility];  Service: Cardiovascular;  Laterality: N/A;   CARDIOVERSION N/A 10/17/2019   Procedure: CARDIOVERSION;  Surgeon: GoMinna MerrittsMD;  [facility];  Service: Cardiovascular;  Laterality: N/A;   CARDIOVERSION N/A 11/08/2021   Procedure: CARDIOVERSION;  Surgeon: GoMinna MerrittsMD;  [facility];  Service: Cardiovascular;  Laterality: N/A;   CESAREAN SECTION     x 3   CLIPPING OF ATRIAL APPENDAGE N/A 03/06/2022   Procedure: CLIPPING OF ATRIAL APPENDAGE USING ATRICURE  CLIP SIZE 45MM;  Surgeon: HeMelrose NakayamaMD;  [place] [facility] Service: Open Heart Surgery;  Laterality: N/A;   EXCISION OF ATRIAL MYXOMA N/A 03/06/2022  Procedure: RESECTION OF AORTIC VALVE TUMOR;  Surgeon: Melrose Nakayama, MD;  Location: [place];  Service: Open Heart Surgery;  Laterality: N/A;   HERNIA REPAIR     x2 one was for sure a hiatal hernia   IR ANGIO EXTRACRAN SEL COM CAROTID INNOMINATE UNI L MOD SED  11/13/2021   IR CT HEAD LTD  11/13/2021   IR PERCUTANEOUS ART THROMBECTOMY/INFUSION INTRACRANIAL INC DIAG ANGIO  11/13/2021   IR THORACENTESIS ASP PLEURAL SPACE W/IMG GUIDE  03/14/2022   LAPAROSCOPIC GASTRIC SLEEVE RESECTION WITH HIATAL HERNIA REPAIR  08/17/2015   Procedure: LAPAROSCOPIC GASTRIC SLEEVE RESECTION WITH HIATAL  HERNIA REPAIR;  Surgeon: Johnathan Hausen, MD;  [facility];  Service: General;;   MAZE N/A 03/06/2022   Procedure: MAZE;  Surgeon: Melrose Nakayama, MD;  Location: [place];  Service: Open Heart Surgery;  Laterality: N/A;   RADIOLOGY WITH ANESTHESIA N/A 11/12/2021   Procedure: IR WITH ANESTHESIA;  Surgeon: Luanne Bras, MD;  Location: [place];  Service: Radiology;  Laterality: N/A;   TEE WITHOUT CARDIOVERSION N/A 08/11/2020   Procedure: TRANSESOPHAGEAL ECHOCARDIOGRAM (TEE);  Surgeon: Constance Haw, MD;  Location: [place] [facility];  Service: Cardiovascular;  Laterality: N/A;   TEE WITHOUT CARDIOVERSION N/A 11/08/2021   Procedure: TRANSESOPHAGEAL ECHOCARDIOGRAM (TEE);  Surgeon: Minna Merritts, MD;  [facility];  Service: Cardiovascular;  Laterality: N/A;   TEE WITHOUT CARDIOVERSION N/A 03/06/2022   Procedure: TRANSESOPHAGEAL ECHOCARDIOGRAM (TEE);  Surgeon: Melrose Nakayama, MD;  Location: [place];  Service: Open Heart Surgery;  Laterality: N/A;   TUBAL LIGATION     x 2   UPPER GI ENDOSCOPY  08/17/2015   Procedure: UPPER GI ENDOSCOPY;  Surgeon: Johnathan Hausen, MD;  [facility];  Service: General;;    Current Outpatient Medications  Medication Sig Dispense Refill   acetaminophen (TYLENOL) 500 MG tablet Take 1,000 mg by mouth every 6 (six) hours as needed (pain/headaches.).     ALPRAZolam (XANAX) 0.25 MG tablet Take 0.125 mg by mouth daily as needed for anxiety.     atorvastatin (LIPITOR) 40 MG tablet Take 1 tablet (40 mg total) by mouth at bedtime. 90 tablet 2   b complex vitamins capsule Take 1 capsule by mouth daily.     Cholecalciferol (VITAMIN D3 PO) Take 1 tablet by mouth in the morning.     ELIQUIS 5 MG TABS tablet TAKE 1 TABLET(5 MG) BY MOUTH TWICE DAILY 180 tablet 1   escitalopram (LEXAPRO) 10 MG tablet Take 10 mg by mouth daily.     furosemide (LASIX) 40 MG tablet Take 1 tablet once daily for 7 days ,then stop 7 tablet 0   midodrine (PROAMATINE) 5 MG  tablet Take 3 tablets by mouth 2 (two) times daily with a meal.     potassium chloride SA (KLOR-CON M20) 20 MEQ tablet Take 1 tablet once daily, with Lasix, for 7 days 7 tablet 0   traMADol (ULTRAM) 50 MG tablet Take 1 tablet (50 mg total) by mouth every 6 (six) hours as needed for moderate pain. 28 tablet 0   No current facility-administered medications for this visit.    Allergies  Allergen Reactions   Codeine Hives        Hydrocodone Hives    Social History   Socioeconomic History   Marital status: Significant Other    Spouse name: Not on file   Number of children: 3   Years of education: two years of college   Highest education  level: Not on file  Occupational History   Occupation: office assistant  Tobacco Use   Smoking status: Every Day    Packs/day: 0.25    Years: 31.00    Total pack years: 7.75    Types: Cigarettes    Passive exposure: Current   Smokeless tobacco: Never   Tobacco comments:    5 cigarettes daily   Vaping Use   Vaping Use: Never used  Substance and Sexual Activity   Alcohol use: Yes    Alcohol/week: 1.0 - 2.0 standard drink of alcohol    Types: 1 - 2 Standard drinks or equivalent per week    Comment: Occaional use - 1-2 mixed drinks prior to stroke since d/c nothing 11/30/21   Drug use: No   Sexual activity: Yes  Other Topics Concern   Not on file  Social History Narrative   Lives at home with significant other.   Some caffeine daily I her green tea.   Right-handed.   Social Determinants of Health   Financial Resource Strain: Low Risk  (08/22/2019)   Overall Financial Resource Strain (CARDIA)    Difficulty of Paying Living Expenses: Not hard at all  Food Insecurity: No Food Insecurity (08/22/2019)   Hunger Vital Sign    Worried About Running Out of Food in the Last Year: Never true    Ran Out of Food in the Last Year: Never true  Transportation Needs: No Transportation Needs (11/29/2021)   PRAPARE - Radiographer, therapeutic (Medical): No    Lack of Transportation (Non-Medical): No  Physical Activity: Sufficiently Active (08/22/2019)   Exercise Vital Sign    Days of Exercise per Week: 7 days    Minutes of Exercise per Session: 150+ min  Stress: No Stress Concern Present (08/22/2019)   [place]    Feeling of Stress : Only a little  Social Connections: Moderately Isolated (08/22/2019)   Social Connection and Isolation Panel [NHANES]    Frequency of Communication with Friends and Family: More than three times a week    Frequency of Social Gatherings with Friends and Family: More than three times a week    Attends Religious Services: 1 to 4 times per year    Active Member of [employer] or Organizations: No    Attends Archivist Meetings: Never    Marital Status: Separated  Intimate Partner Violence: Not At Risk (08/22/2019)   Humiliation, Afraid, Rape, and Kick questionnaire    Fear of Current or Ex-Partner: No    Emotionally Abused: No    Physically Abused: No    Sexually Abused: No     Review of Systems: All other systems reviewed and are otherwise negative except as noted above.  Physical Exam: Vitals:   05/02/22 1213  BP: 132/86  Pulse: 74  SpO2: 97%  Weight: 146 lb 9.6 oz (66.5 kg)  Height: '5\' 4"'$  (1.626 m)    GEN- The patient is well appearing, alert and oriented x 3 today.   HEENT: normocephalic, atraumatic; sclera clear, conjunctiva pink; hearing intact; oropharynx clear; neck supple, no JVP Lymph- no cervical lymphadenopathy Lungs- Clear to ausculation bilaterally, normal work of breathing.  No wheezes, rales, rhonchi Heart- Regular rate and rhythm, no murmurs, rubs or gallops, PMI not laterally displaced GI- soft, non-tender, non-distended, bowel sounds present, no hepatosplenomegaly Extremities- no clubbing, cyanosis, or edema; DP/PT/radial pulses 2+ bilaterally MS- no significant deformity or  atrophy Skin- warm and  dry, no rash or lesion Psych- euthymic mood, full affect Neuro- strength and sensation are intact  EKG is ordered. Personal review of EKG from today shows junctional rhythm at 74 bpm with narrow QRS at 88 ms  Additional studies reviewed include: Previous EP office notes.   Assessment and Plan:  1.  Persistent atrial fibrillation/flutter: Continue Eliquis 5 mg twice daily for CHA2DS2-VASc of 3. She is status post ablation x2 as well as surgical maze with left atrial appendage clipping. EKG today shows she remains in junctional rhythm  2. Acute on chronic diastolic CHF Volume status improved Continue lasix 20 mg as needed.   Reviewed sliding scale diuretics for weight gain of 3 lbs over night or 5 lbs within one week.  Labs today.   3. Tobacco abuse Cessation Encouraged  4. Morbid Obesity Post gastric bypass   5.  Hypertrophic cardiomyopathy: Found on cardiac MRI.  Has LGE.  Management per primary cardiology.   6.  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  Continue with current management.     Follow up with Dr. Curt Bears in 6 months   Shirley Friar, PA-C  05/02/22 12:21 PM

## 2022-05-02 NOTE — Progress Notes (Signed)
[address]       [place] [address]             [phone]    HPI: Hannah Zimmerman returns for follow-up after recent open heart surgery.   Hannah Zimmerman is a 51 year old woman with a history of stroke, papillary fibroelastoma of the aortic valve, persistent atrial fibrillation, CHF, morbid obesity status post gastric sleeve, pulmonary hypertension, and tobacco use.  She underwent resection of a papillary fibroelastoma, Maze procedure and left atrial appendage clipping on 03/06/2022.  Postoperatively she had a slow junctional rhythm in the 40s to 50s but did not require pacemaker.  She presented back with shortness of breath about a month postop.  She had a right pleural effusion.  Thoracentesis drained about 750 mL of fluid.  She also had some complaints of swelling in her left leg and foot.  She was on Eliquis but we did a duplex anyway which showed no evidence of DVT.  Today she is feeling better.  She is starting to get some energy.  She is almost 2 months out from surgery.  She is not having significant incisional pain.  Swelling in her left foot has improved.  No shortness of breath.  Past Medical History:  Diagnosis Date   (HFpEF) heart failure with preserved ejection fraction ([place])    a. 07/2019 Echo: EF 60-65%, mod LVH. Sev dil LA, mildly dil RA. Mod elev PASP; b. 07/2020 TEE: EF 50-55%, no rwma, sev conc LVH. Nl RV fxn. Mod dil RA. Mild-mod MR, mod TR.   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 07/2020 Cardiac CT: Cor Ca2+ = 338 (99th %'ile).   Complication of anesthesia 20 years ago   a. spinal with first c section went too high stopped breathing, low bp with 2nd c section, no further issues with anesthesia   Heart murmur    a. 07/2019 Echo: no significant valvular dzs; b. 07/2020 TEE: Mild-mod MR, mod TR.   History of hiatal hernia    Hypertrophic cardiomyopathy ([place])    a. 07/2019 Echo: Mod LVH; b. 01/2020 cMRI: EF 67%, HCM w/o  obstruction. Max wall thickness 76m. Sev LAE w/ L->R atrial level shunt. Large area of LGE @ mid-ventricular level in area of max wall thickness; c. 07/2020 Cardiac CT: Asymm hypertrophy up to 160min mid inferoseptum consistent w/ known HCM.   Morbid [place] ([place]   a. 09/2014 s/p gastric bypass.   Persistent atrial fibrillation ([facility]   a. Dx 07/2019-->CHA2DS2VASc = 2 (diast CHF/Fem)-->Eliquis; b. 09/2018 s/p DCCV (150J (biphasic) x 1); c. 12/2019 RFCA/PVI; d. 07/2020 repeat RFCA/PVI.   Rash    both arms from old bed bug bites, healing   [facility]   Tobacco abuse    Typical atrial flutter ([place]   a. 12/2019 s/p RFCA.    Current Outpatient Medications  Medication Sig Dispense Refill   acetaminophen (TYLENOL) 500 MG tablet Take 1,000 mg by mouth every 6 (six) hours as needed (pain/headaches.).     ALPRAZolam (XANAX) 0.25 MG tablet Take 0.125 mg by mouth daily as needed for anxiety.     atorvastatin (LIPITOR) 40 MG tablet Take 1 tablet (40 mg total) by mouth at bedtime. 90 tablet 2   b complex vitamins capsule Take 1 capsule by mouth daily.     Cholecalciferol (VITAMIN D3 PO) Take 1 tablet by mouth in the morning.  ELIQUIS 5 MG TABS tablet TAKE 1 TABLET(5 MG) BY MOUTH TWICE DAILY 180 tablet 1   escitalopram (LEXAPRO) 10 MG tablet Take 10 mg by mouth daily.     furosemide (LASIX) 40 MG tablet Take 1 tablet once daily for 7 days ,then stop 7 tablet 0   potassium chloride SA (KLOR-CON M20) 20 MEQ tablet Take 1 tablet once daily, with Lasix, for 7 days 7 tablet 0   traMADol (ULTRAM) 50 MG tablet Take 1 tablet (50 mg total) by mouth every 6 (six) hours as needed for moderate pain. 28 tablet 0   No current facility-administered medications for this visit.    Physical Exam BP 118/79 (BP Location: Left Arm, Patient Position: Sitting)   Pulse 75   Resp 18   Ht '5\' 4"'$  (1.626 m)   Wt 146 lb (66.2 kg)   SpO2 97% Comment: RA  BMI 25.44 kg/m  51 year old woman in no acute distress Alert  and oriented Sternal incision clean dry intact, sternum stable Lungs clear bilaterally  Diagnostic Tests: I personally reviewed the chest x-ray images.  There are postoperative changes.  Right pleural effusion has resolved.  Impression: Hannah Zimmerman is a 51 year old woman with a past history significant for stroke, papillary fibroelastoma of aortic valve, persistent atrial fibrillation, congestive heart failure, morbid obesity status post sleeve gastrectomy, pulmonary hypertension, and tobacco use.  She underwent resection of a papillary fibroelastoma of the aortic valve, Maze procedure, and left atrial appendage clipping on 03/06/2022.  Right pleural effusion-resolved.  Junctional rhythm post maze-has a follow-up scheduled with Dr. Curt Bears later today.  Heart rate is a little better today so hopefully will need pacemaker.  She is now almost 2 months out from surgery.  No restrictions on her activities from my standpoint.  Was advised to build into new activities gradually.  Plan: Follow-up with cardiology I will be happy to see Hannah Zimmerman back anytime in the future if I can be of any further assistance with her care  Melrose Nakayama, MD [facility] [phone]

## 2022-06-08 ENCOUNTER — Other Ambulatory Visit: Payer: Self-pay | Admitting: Physician Assistant

## 2022-07-05 ENCOUNTER — Other Ambulatory Visit: Payer: Self-pay | Admitting: Family Medicine

## 2022-07-05 DIAGNOSIS — M5416 Radiculopathy, lumbar region: Secondary | ICD-10-CM

## 2022-07-19 ENCOUNTER — Encounter: Payer: Self-pay | Admitting: Cardiology

## 2022-08-02 ENCOUNTER — Ambulatory Visit (INDEPENDENT_AMBULATORY_CARE_PROVIDER_SITE_OTHER): Payer: Commercial Managed Care - HMO | Admitting: Neurology

## 2022-08-02 ENCOUNTER — Telehealth: Payer: Self-pay | Admitting: Neurology

## 2022-08-02 ENCOUNTER — Encounter: Payer: Self-pay | Admitting: Neurology

## 2022-08-02 VITALS — BP 130/93 | HR 84 | Ht 64.0 in | Wt 138.2 lb

## 2022-08-02 DIAGNOSIS — Z8673 Personal history of transient ischemic attack (TIA), and cerebral infarction without residual deficits: Secondary | ICD-10-CM

## 2022-08-02 DIAGNOSIS — R413 Other amnesia: Secondary | ICD-10-CM | POA: Diagnosis not present

## 2022-08-02 DIAGNOSIS — G3184 Mild cognitive impairment, so stated: Secondary | ICD-10-CM

## 2022-08-02 DIAGNOSIS — I4819 Other persistent atrial fibrillation: Secondary | ICD-10-CM

## 2022-08-02 DIAGNOSIS — R2689 Other abnormalities of gait and mobility: Secondary | ICD-10-CM

## 2022-08-02 NOTE — Patient Instructions (Signed)
I had a long discussion with the patient and her daughter regarding her new complaints of worsening stroke deficits following cardiac surgery checking MRI scan of the brain to rule out interval new strokes.  Also recommend check memory panel labs, lipid profile and hemoglobin A1c.  I encouraged her to increase participation in cognitively challenging activities like solving crossword puzzles, playing bridge and sudoku.  We also discussed memory compensation strategies.  Eliquis for stroke prevention for A-fib and maintain aggressive risk factor modification.  She may also consider participation in the [place] AFib stroke prevention study if interested.  Return for follow-up in 6 months or call earlier if necessary.  Memory Compensation Strategies  Use "WARM" strategy.  W= write it down  A= associate it  R= repeat it  M= make a mental note  2.   You can keep a Social worker.  Use a 3-ring notebook with sections for the following: calendar, important names and phone numbers,  medications, doctors' names/phone numbers, lists/reminders, and a section to journal what you did  each day.   3.    Use a calendar to write appointments down.  4.    Write yourself a schedule for the day.  This can be placed on the calendar or in a separate section of the Memory Notebook.  Keeping a  regular schedule can help memory.  5.    Use medication organizer with sections for each day or morning/evening pills.  You may need help loading it  6.    Keep a basket, or pegboard by the door.  Place items that you need to take out with you in the basket or on the pegboard.  You may also want to  include a message board for reminders.  7.    Use sticky notes.  Place sticky notes with reminders in a place where the task is performed.  For example: " turn off the  stove" placed by the stove, "lock the door" placed on the door at eye level, " take your medications" on  the bathroom mirror or by the place where you  normally take your medications.  8.    Use alarms/timers.  Use while cooking to remind yourself to check on food or as a reminder to take your medicine, or as a  reminder to make a call, or as a reminder to perform another task, etc.

## 2022-08-02 NOTE — Telephone Encounter (Signed)
[employer] sent to GI they obtain auth [id number] 

## 2022-08-02 NOTE — Progress Notes (Signed)
[facility] [address] [place]. [place] [address] [phone]       OFFICE CONSULT NOTE  Hannah Zimmerman Date of Birth:  [birth year] Medical Record Number:  [id number]   Referring MD:  Egbert Garibaldi  Reason for Referral: Stroke  [id number] visit 01/18/22 ; Mr. Depaoli is a 51 year old pleasant Caucasian lady seen today for initial office consultation visit for stroke.  History is obtained from the patient and her daughter as well as review of electronic medical records and opossum reviewed pertinent available imaging films in PACS.  She has past medical history of atrial fibrillation/flutter, congestive heart failure, tobacco abuse who presented to [facility] on 11/12/2021 with sudden onset of lethargy, left-sided weakness, slurred speech and gaze deviation.  NIH stroke scale was 24 on admission.  She was not a candidate for IV thrombolysis as she was on Eliquis.  Noncontrast CAT scan of the head showed aspect score of 10 with developing some hypodensity in the right hemisphere and dense right MCA sign.  CT angiogram of the head and neck showed nonopacification proximal to mid right ICA extending into the intracranial portion of the terminus and proximal right MCA.  Patient was emergently transferred to [facility] and underwent successful mechanical thrombectomy with TICI3 revascularization by Dr. Estanislado Pandy.  She was kept in the intensive care unit and closely monitored and blood pressure was tightly controlled.  She did very well and noticed significant improvement in the left-sided deficits.  Echocardiogram showed ejection fraction of 60 to 65% with severe concentric left ventricular hypertrophy.  LDL cholesterol was 87 mg percent.  Hemoglobin A1c of 6.1.  MRI scan of the brain shows patchy infarction in the right basal ganglia with some mild petechial hemorrhage.  There was also subtle diffusion signal in the adjacent right frontal and insular  cortex.  Discussion was made about switching Eliquis to alternative anticoagulants but patient's insurance co-pay would be quite high and hence it was decided to continue Eliquis.  Patient was discharged home with home physical and Occupational Therapy.  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  The patient states her left-sided deficits have mostly resolved.  She has cut back smoking but still smokes about 6 cigarettes a day.  She is complaining of chronic headaches since her stroke.  These are mostly in the back of the head and top of the neck they are mild to moderate dull in quality.  No accompanying light or sound sensitivity or nausea.  She takes Tylenol 2 tablets twice daily for last several months.  She does admit to significant increase stress in her life and she has not been participating in any stress laxation activity.  She does complain of decreased strength and difficulty with focusing and plans to see an eye doctor. Update 08/02/2022 ; she returns for follow-up after last visit with  me 6 months ago.  She is accompanied by her daughter.  Patient states she had open heart surgery with resection of aortic valve fibroelastoma with clipping of left atrial appendage on 03/06/2022.  Surgery went well without complications but she has noticed worsening of her existing stroke symptoms with speech and word finding difficulties, depth perception on the right and she is tired.  She denies any new strokelike symptoms.  She is back on Eliquis and is tolerating it well without bleeding or bruising.  She is tolerating Lipitor  well without muscle aches.  Her blood pressure is under good control and today it is 130/93.  Having mild short-term memory difficulties and trouble remembering recent information.  He has not been participating in cognitively challenging activities.  He has not had any evaluation for her memory  loss. ROS:   14 system review of systems is positive for headache, memory loss, speech difficulties, word finding difficulties, difficult flexion, imbalance neck tightness, weakness all other systems negative  PMH:  Past Medical History:  Diagnosis Date   (HFpEF) heart failure with preserved ejection fraction ([place])    a. 07/2019 Echo: EF 60-65%, mod LVH. Sev dil LA, mildly dil RA. Mod elev PASP; b. 07/2020 TEE: EF 50-55%, no rwma, sev conc LVH. Nl RV fxn. Mod dil RA. Mild-mod MR, mod TR.   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 07/2020 Cardiac CT: Cor Ca2+ = 338 (99th %'ile).   Complication of anesthesia 20 years ago   a. spinal with first c section went too high stopped breathing, low bp with 2nd c section, no further issues with anesthesia   Heart murmur    a. 07/2019 Echo: no significant valvular dzs; b. 07/2020 TEE: Mild-mod MR, mod TR.   History of hiatal hernia    Hypertrophic cardiomyopathy ([place])    a. 07/2019 Echo: Mod LVH; b. 01/2020 cMRI: EF 67%, HCM w/o obstruction. Max wall thickness 68m. Sev LAE w/ L->R atrial level shunt. Large area of LGE @ mid-ventricular level in area of max wall thickness; c. 07/2020 Cardiac CT: Asymm hypertrophy up to 175min mid inferoseptum consistent w/ known HCM.   Morbid [facility] ([facility]   a. 09/2014 s/p gastric bypass.   Persistent atrial fibrillation ([facility]   a. Dx 07/2019-->CHA2DS2VASc = 2 (diast CHF/Fem)-->Eliquis; b. 09/2018 s/p DCCV (150J (biphasic) x 1); c. 12/2019 RFCA/PVI; d. 07/2020 repeat RFCA/PVI.   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 12/2019 s/p RFCA.    Social History:  Social History   Socioeconomic History   Marital status: Significant Other    Spouse name: Not on file   Number of children: 3   Years of education: two years of college   Highest education level: Not on file  Occupational History   Occupation: office  assistant  Tobacco Use   Smoking status: Every Day    Packs/day: 0.25    Years: 31.00    Total pack years: 7.75    Types: Cigarettes    Passive exposure: Current   Smokeless tobacco: Never   Tobacco comments:    5 cigarettes daily   Vaping Use   Vaping Use: Never used  Substance and Sexual Activity   Alcohol use: Yes    Alcohol/week: 1.0 - 2.0 standard drink of alcohol    Types: 1 - 2 Standard drinks or equivalent per week    Comment: Occaional use - 1-2 mixed drinks prior to stroke since d/c nothing 11/30/21   Drug use: No   Sexual activity: Yes  Other Topics Concern   Not on file  Social History Narrative   Lives at home with significant other.   Some caffeine daily I her green tea.   Right-handed.   Social Determinants of Health   Financial Resource Strain: Low Risk  (08/22/2019)   Overall Financial Resource Strain (CARDIA)  Difficulty of Paying Living Expenses: Not hard at all  Food Insecurity: No Food Insecurity (08/22/2019)   Hunger Vital Sign    Worried About Running Out of Food in the Last Year: Never true    Ran Out of Food in the Last Year: Never true  Transportation Needs: No Transportation Needs (11/29/2021)   PRAPARE - Hydrologist (Medical): No    Lack of Transportation (Non-Medical): No  Physical Activity: Sufficiently Active (08/22/2019)   Exercise Vital Sign    Days of Exercise per Week: 7 days    Minutes of Exercise per Session: 150+ min  Stress: No Stress Concern Present (08/22/2019)   Liberal    Feeling of Stress : Only a little  Social Connections: Moderately Isolated (08/22/2019)   Social Connection and Isolation Panel [NHANES]    Frequency of Communication with Friends and Family: More than three times a week    Frequency of Social Gatherings with Friends and Family: More than three times a week    Attends Religious Services: 1 to 4 times per year     Active Member of [employer] or Organizations: No    Attends Archivist Meetings: Never    Marital Status: Separated  Intimate Partner Violence: Not At Risk (08/22/2019)   Humiliation, Afraid, Rape, and Kick questionnaire    Fear of Current or Ex-Partner: No    Emotionally Abused: No    Physically Abused: No    Sexually Abused: No    Medications:   Current Outpatient Medications on File Prior to Visit  Medication Sig Dispense Refill   acetaminophen (TYLENOL) 500 MG tablet Take 1,000 mg by mouth every 6 (six) hours as needed (pain/headaches.).     b complex vitamins capsule Take 1 capsule by mouth daily.     Cholecalciferol (VITAMIN D3 PO) Take 1 tablet by mouth in the morning.     cyclobenzaprine (FLEXERIL) 10 MG tablet Take 10 mg by mouth 2 (two) times daily as needed for muscle spasms. 1/2 - 1 po bid prn     ELIQUIS 5 MG TABS tablet TAKE 1 TABLET(5 MG) BY MOUTH TWICE DAILY 180 tablet 1   furosemide (LASIX) 40 MG tablet Take 1 tablet once daily for 7 days ,then stop 7 tablet 0   midodrine (PROAMATINE) 5 MG tablet Take 3 tablets by mouth 2 (two) times daily with a meal.     pantoprazole (PROTONIX) 20 MG tablet Take 20 mg by mouth daily.     potassium chloride SA (KLOR-CON M20) 20 MEQ tablet Take 1 tablet once daily, with Lasix, for 7 days 7 tablet 0   pravastatin (PRAVACHOL) 20 MG tablet Take 20 mg by mouth daily.     rOPINIRole (REQUIP) 0.5 MG tablet Take 0.5 mg by mouth 3 (three) times daily.     ALPRAZolam (XANAX) 0.25 MG tablet Take 0.125 mg by mouth daily as needed for anxiety. (Patient not taking: Reported on 08/02/2022)     escitalopram (LEXAPRO) 10 MG tablet Take 10 mg by mouth daily.     traMADol (ULTRAM) 50 MG tablet Take 1 tablet (50 mg total) by mouth every 6 (six) hours as needed for moderate pain. (Patient not taking: Reported on 08/02/2022) 28 tablet 0   No current facility-administered medications on file prior to visit.    Allergies:   Allergies  Allergen  Reactions   Codeine Hives        Hydrocodone  Hives    Physical Exam General: well developed, well nourished, seated, in no evident distress Head: head normocephalic and atraumatic.   Neck: supple with no carotid or supraclavicular bruits Cardiovascular: regular rate and rhythm, no murmurs Musculoskeletal: no deformity Skin:  no rash/petichiae Vascular:  Normal pulses all extremities  Neurologic Exam Mental Status: Awake and fully alert. Oriented to place and time. Recent and remote memory intact. Attention span, concentration and fund of knowledge appropriate. Mood and affect appropriate.  Intact recall 3/3.  Able to name 14 animals which can walk on 4 legs.  Clock drawing 4/4. Cranial Nerves: Fundoscopic exam not done. Pupils equal, briskly reactive to light. Extraocular movements full without nystagmus. Visual fields full to confrontation. Hearing intact. Facial sensation intact. Face, tongue, palate moves normally and symmetrically.  Motor: Normal bulk and tone. Normal strength in all tested extremity muscles.  Diminished fine finger movements on the left.  Orbits right over left upper extremity. Sensory.: intact to touch , pinprick , position and vibratory sensation.  Coordination: Rapid alternating movements normal in all extremities. Finger-to-nose and heel-to-shin performed accurately bilaterally. Gait and Station: Arises from chair without difficulty. Stance is normal. Gait demonstrates normal stride length and balance . Able to heel, toe and tandem walk with only mild difficulty.  Reflexes: 1+ and symmetric. Toes downgoing.   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  Patient has been extremely well with hardly any residual deficits.  Vascular risk factors of  atrial fibrillation, hyperlipidemia, hypertension and smoking.  She has had worsening of her stroke deficits following an open heart surgery in June 2023 along with mild cognitive impairment.    PLAN:I had a long discussion with the patient and her daughter regarding her new complaints of worsening stroke deficits following cardiac surgery checking MRI scan of the brain to rule out interval new strokes.  Also recommend check memory panel labs, lipid profile and hemoglobin A1c.  I encouraged her to increase participation in cognitively challenging activities like solving crossword puzzles, playing bridge and sudoku.  We also discussed memory compensation strategies.  Eliquis for stroke prevention for A-fib and maintain aggressive risk factor modification.  She may also consider participation in the [place] AFib stroke prevention study if interested.  Return for follow-up in 6 months or call earlier if necessary..  Greater than 50% time during this 40-minute  visit were spent on counseling and coordination of care about her embolic stroke and atrial fibrillation and mild cognitive impairment and discussion about prevention and treatment and answering questions. Antony Contras, MD  Note: This document was prepared with digital dictation and possible smart phrase technology. Any transcriptional errors that result from this process are unintentional.

## 2022-08-03 ENCOUNTER — Other Ambulatory Visit: Payer: Self-pay | Admitting: Neurology

## 2022-08-03 LAB — LIPID PANEL
Chol/HDL Ratio: 2.4 ratio (ref 0.0–4.4)
Cholesterol, Total: 165 mg/dL (ref 100–199)
HDL: 69 mg/dL (ref >39)
LDL Chol Calc (NIH): 83 mg/dL (ref 0–99)
Triglycerides: 64 mg/dL (ref 0–149)
VLDL Cholesterol Cal: 13 mg/dL (ref 5–40)

## 2022-08-03 LAB — DEMENTIA PANEL
Homocysteine: 15 umol/L — ABNORMAL HIGH (ref 0.0–14.5)
RPR Ser Ql: NONREACTIVE
TSH: 2.27 u[IU]/mL (ref 0.450–4.500)
Vitamin B-12: >2000 pg/mL — ABNORMAL HIGH (ref 232–1245)

## 2022-08-03 LAB — HEMOGLOBIN A1C
Est. average glucose Bld gHb Est-mCnc: 111 mg/dL
Hgb A1c MFr Bld: 5.5 % (ref 4.8–5.6)

## 2022-08-03 NOTE — Progress Notes (Signed)
Kindly inform the patient that cholesterol profile is not satisfactory and bad cholesterol is slightly elevated so I recommend she increase the dose of Pravachol from 20 to 40 mg daily.  Lab work for reversible causes of memory loss is satisfactory.  Screening blood test for diabetes is also satisfactory

## 2022-08-07 ENCOUNTER — Telehealth: Payer: Self-pay

## 2022-08-07 NOTE — Telephone Encounter (Signed)
-----   Message from Garvin Fila, MD sent at 08/03/2022  5:04 PM EST ----- Kindly inform the patient that cholesterol profile is not satisfactory and bad cholesterol is slightly elevated so I recommend she increase the dose of Pravachol from 20 to 40 mg daily.  Lab work for reversible causes of memory loss is satisfactory.  Screening blood test for diabetes is also satisfactory

## 2022-08-07 NOTE — Telephone Encounter (Signed)
I called patient. I discussed her lab results and recommendations. She will increase pravachol to '40mg'$  daily. Pt verbalized understanding of results. Pt had no questions at this time but was encouraged to call back if questions arise.

## 2022-08-08 ENCOUNTER — Ambulatory Visit
Admission: RE | Admit: 2022-08-08 | Discharge: 2022-08-08 | Disposition: A | Discharge status: Home / Self Care | Payer: Commercial Managed Care - HMO | Source: Ambulatory Visit | Attending: Family Medicine | Admitting: Family Medicine

## 2022-08-08 DIAGNOSIS — M5416 Radiculopathy, lumbar region: Secondary | ICD-10-CM

## 2022-08-23 ENCOUNTER — Encounter: Payer: Self-pay | Admitting: Neurology

## 2022-08-24 ENCOUNTER — Ambulatory Visit
Admission: RE | Admit: 2022-08-24 | Discharge: 2022-08-24 | Disposition: A | Discharge status: Home / Self Care | Payer: Commercial Managed Care - HMO | Source: Ambulatory Visit | Attending: Neurology | Admitting: Neurology

## 2022-08-24 DIAGNOSIS — R2689 Other abnormalities of gait and mobility: Secondary | ICD-10-CM | POA: Diagnosis not present

## 2022-08-24 MED ORDER — GADOBENATE DIMEGLUMINE 529 MG/ML IV SOLN
12.0000 mL | Freq: Once | INTRAVENOUS | Status: AC | PRN
  Administered 2022-08-24: 12 mL via INTRAVENOUS

## 2022-09-07 ENCOUNTER — Ambulatory Visit (INDEPENDENT_AMBULATORY_CARE_PROVIDER_SITE_OTHER): Payer: Commercial Managed Care - HMO | Admitting: Neurology

## 2022-09-07 DIAGNOSIS — R4182 Altered mental status, unspecified: Secondary | ICD-10-CM

## 2022-09-07 DIAGNOSIS — R413 Other amnesia: Secondary | ICD-10-CM

## 2022-09-09 NOTE — Progress Notes (Signed)
Kindly inform the patient that MRI scan of the brain showed expected changes and no surprises compared to previous MRI from February 2023.  Nothing to worry about

## 2022-09-14 NOTE — Progress Notes (Signed)
Kindly inform the patient that EEG or brainwave study was normal.  No seizure activity noted.

## 2022-09-19 DIAGNOSIS — M5416 Radiculopathy, lumbar region: Secondary | ICD-10-CM | POA: Diagnosis not present

## 2022-09-26 DIAGNOSIS — M5416 Radiculopathy, lumbar region: Secondary | ICD-10-CM | POA: Diagnosis not present

## 2022-09-28 DIAGNOSIS — M5416 Radiculopathy, lumbar region: Secondary | ICD-10-CM | POA: Diagnosis not present

## 2022-09-28 DIAGNOSIS — M5126 Other intervertebral disc displacement, lumbar region: Secondary | ICD-10-CM | POA: Diagnosis not present

## 2022-09-28 DIAGNOSIS — M62830 Muscle spasm of back: Secondary | ICD-10-CM | POA: Diagnosis not present

## 2022-09-28 DIAGNOSIS — M5136 Other intervertebral disc degeneration, lumbar region: Secondary | ICD-10-CM | POA: Diagnosis not present

## 2022-10-06 DIAGNOSIS — M5416 Radiculopathy, lumbar region: Secondary | ICD-10-CM | POA: Diagnosis not present

## 2022-10-09 NOTE — Progress Notes (Signed)
Referring Physician:  Harvest Dark, FNP [place],  [place] [address]  Primary Physician:  Donnamarie Rossetti, PA-C  History of Present Illness: 10/10/2022 [name] is here today with a chief complaint of back and leg pain.  She has back pain in the upper part of her lumbar spine.  She has leg pain that extends onto her anterior thighs bilaterally.  She has been having pain on and off for years.  Usually chiropractic manipulation has helped.  This exacerbation has been ongoing for approximately 1 year without improvement.  She feels like a knife is stabbing her.  It sometimes takes her breath away.  Her pain is as bad as 7 out of 10 and made worse by standing and bending.  She can only stand for approximately 5 minutes before pain hits her.  She has tried physical therapy and injections.  Injections helped for short period of time.  She had a very complex 2023 with multiple medical interventions.  She had a stroke, as well as a surgery for her heart.  She is doing much better from this now.  She is on Eliquis.  Conservative measures: heat, ice, chiropractor Physical therapy: currently participating in at [facility], started on 08/15/22 Multimodal medical therapy including regular antiinflammatories:  gabapentin, prednisone, tramadol, flexeril, tylenol Injections:  has received epidural steroid injections 08/31/2022: Left L2-3 and left L3-4 transforaminal ESI (1 week of good relief and then return of pain.)   Past Surgery: denies   [name] has no symptoms of cervical myelopathy.  The symptoms are causing a significant impact on the patient's life.   I have utilized the care everywhere function in epic to review the outside records available from external health systems.  Review of Systems:  A 10 point review of systems is negative, except for the pertinent positives and negatives detailed in the HPI.  Past Medical History: Past  Medical History:  Diagnosis Date   (HFpEF) heart failure with preserved ejection fraction ([place])    a. 07/2019 Echo: EF 60-65%, mod LVH. Sev dil LA, mildly dil RA. Mod elev PASP; b. 07/2020 TEE: EF 50-55%, no rwma, sev conc LVH. Nl RV fxn. Mod dil RA. Mild-mod MR, mod TR.   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 07/2020 Cardiac CT: Cor Ca2+ = 338 (99th %'ile).   Complication of anesthesia 20 years ago   a. spinal with first c section went too high stopped breathing, low bp with 2nd c section, no further issues with anesthesia   Heart murmur    a. 07/2019 Echo: no significant valvular dzs; b. 07/2020 TEE: Mild-mod MR, mod TR.   History of hiatal hernia    Hypertrophic cardiomyopathy ([place])    a. 07/2019 Echo: Mod LVH; b. 01/2020 cMRI: EF 67%, HCM w/o obstruction. Max wall thickness 35m. Sev LAE w/ L->R atrial level shunt. Large area of LGE @ mid-ventricular level in area of max wall thickness; c. 07/2020 Cardiac CT: Asymm hypertrophy up to 168min mid inferoseptum consistent w/ known HCM.   Morbid obesity ([facility]   a. 09/2014 s/p gastric bypass.   Persistent atrial fibrillation ([facility]   a. Dx 07/2019-->CHA2DS2VASc = 2 (diast CHF/Fem)-->Eliquis; b. 09/2018 s/p DCCV (150J (biphasic) x 1); c. 12/2019 RFCA/PVI; d. 07/2020 repeat RFCA/PVI.   Rash    both arms from old bed bug bites, healing   [facility]   Tobacco abuse  Typical atrial flutter ([place])    a. 12/2019 s/p RFCA.    Past Surgical History: Past Surgical History:  Procedure Laterality Date   ABDOMINAL HYSTERECTOMY     ANKLE SURGERY     left x 2   ATRIAL FIBRILLATION ABLATION N/A 12/24/2019   Procedure: ATRIAL FIBRILLATION ABLATION;  Surgeon: Constance Haw, MD;  Location: [place] [facility] LAB;  Service: Cardiovascular;  Laterality: N/A;   ATRIAL FIBRILLATION ABLATION N/A 08/11/2020   Procedure: ATRIAL FIBRILLATION ABLATION;  Surgeon: Constance Haw, MD;  Location: [place] CV LAB;  Service:  Cardiovascular;  Laterality: N/A;   CARDIOVERSION N/A 09/24/2019   Procedure: CARDIOVERSION;  Surgeon: Minna Merritts, MD;  Location: [place] [facility];  Service: Cardiovascular;  Laterality: N/A;   CARDIOVERSION N/A 10/17/2019   Procedure: CARDIOVERSION;  Surgeon: Minna Merritts, MD;  Location: [facility];  Service: Cardiovascular;  Laterality: N/A;   CARDIOVERSION N/A 11/08/2021   Procedure: CARDIOVERSION;  Surgeon: Minna Merritts, MD;  Location: [facility];  Service: Cardiovascular;  Laterality: N/A;   CESAREAN SECTION     x 3   CLIPPING OF ATRIAL APPENDAGE N/A 03/06/2022   Procedure: CLIPPING OF ATRIAL APPENDAGE USING ATRICURE  CLIP SIZE 45MM;  Surgeon: Melrose Nakayama, MD;  Location: [place];  Service: Open Heart Surgery;  Laterality: N/A;   EXCISION OF ATRIAL MYXOMA N/A 03/06/2022   Procedure: RESECTION OF AORTIC VALVE TUMOR;  Surgeon: Melrose Nakayama, MD;  Location: [place];  Service: Open Heart Surgery;  Laterality: N/A;   HERNIA REPAIR     x2 one was for sure a hiatal hernia   IR ANGIO EXTRACRAN SEL COM CAROTID INNOMINATE UNI L MOD SED  11/13/2021   IR CT HEAD LTD  11/13/2021   IR PERCUTANEOUS ART THROMBECTOMY/INFUSION INTRACRANIAL INC DIAG ANGIO  11/13/2021   IR THORACENTESIS ASP PLEURAL SPACE W/IMG GUIDE  03/14/2022   LAPAROSCOPIC GASTRIC SLEEVE RESECTION  2020   LAPAROSCOPIC GASTRIC SLEEVE RESECTION WITH HIATAL HERNIA REPAIR  08/17/2015   Procedure: LAPAROSCOPIC GASTRIC SLEEVE RESECTION WITH HIATAL HERNIA REPAIR;  Surgeon: Johnathan Hausen, MD;  [facility];  Service: General;;   MAZE N/A 03/06/2022   Procedure: MAZE;  Surgeon: Melrose Nakayama, MD;  Location: [place];  Service: Open Heart Surgery;  Laterality: N/A;   RADIOLOGY WITH ANESTHESIA N/A 11/12/2021   Procedure: IR WITH ANESTHESIA;  Surgeon: Luanne Bras, MD;  Location: [place];  Service: Radiology;  Laterality: N/A;   TEE WITHOUT CARDIOVERSION N/A 08/11/2020   Procedure: TRANSESOPHAGEAL ECHOCARDIOGRAM  (TEE);  Surgeon: Constance Haw, MD;  Location: [place] CV LAB;  Service: Cardiovascular;  Laterality: N/A;   TEE WITHOUT CARDIOVERSION N/A 11/08/2021   Procedure: TRANSESOPHAGEAL ECHOCARDIOGRAM (TEE);  Surgeon: Minna Merritts, MD;  Location: [facility];  Service: Cardiovascular;  Laterality: N/A;   TEE WITHOUT CARDIOVERSION N/A 03/06/2022   Procedure: TRANSESOPHAGEAL ECHOCARDIOGRAM (TEE);  Surgeon: Melrose Nakayama, MD;  Location: [place];  Service: Open Heart Surgery;  Laterality: N/A;   TUBAL LIGATION     x 2   UPPER GI ENDOSCOPY  08/17/2015   Procedure: UPPER GI ENDOSCOPY;  Surgeon: Johnathan Hausen, MD;  Location[facility];  Service: General;;    Allergies: Allergies as of 10/10/2022 - Review Complete 10/10/2022  Allergen Reaction Noted   Atorvastatin Nausea Only 07/25/2022   Codeine Hives 06/05/2011   Hydrocodone Hives 06/05/2011    Medications: No outpatient medications have been marked as taking for the 10/10/22 encounter (Office Visit) with  Meade Maw, MD.    Social History: Social History   Tobacco Use   Smoking status: Former    Packs/day: 0.25    Years: 31.00    Total pack years: 7.75    Types: Cigarettes    Quit date: 10/03/2022    Years since quitting: 0.0    Passive exposure: Current   Smokeless tobacco: Never  Vaping Use   Vaping Use: Never used  Substance Use Topics   Alcohol use: Yes    Alcohol/week: 1.0 - 2.0 standard drink of alcohol    Types: 1 - 2 Standard drinks or equivalent per week    Comment: Occaional use - 1-2 mixed drinks prior to stroke since d/c nothing 11/30/21   Drug use: No    Family Medical History: Family History  Problem Relation Age of Onset   Hypertension Mother    Diabetes Mother    Heart failure Mother     Physical Examination: Vitals:   10/10/22 1405  BP: (!) 142/97  Pulse: 86    General: Patient is well developed, well nourished, calm, collected, and in no apparent distress. Attention to  examination is appropriate.  Neck:   Supple.  Full range of motion.  Respiratory: Patient is breathing without any difficulty.   NEUROLOGICAL:     Awake, alert, oriented to person, place, and time.  Speech is clear and fluent.   Cranial Nerves: Pupils equal round and reactive to light.  Facial tone is symmetric.  Facial sensation is symmetric. Shoulder shrug is symmetric. Tongue protrusion is midline.  There is no pronator drift.  ROM of spine: full. TTP in mid to upper l spine   Strength: Side Biceps Triceps Deltoid Interossei Grip Wrist Ext. Wrist Flex.  R '5 5 5 5 5 5 5  '$ L '5 5 5 5 5 5 5   '$ Side Iliopsoas Quads Hamstring PF DF EHL  R '5 5 5 5 5 5  '$ L '5 5 5 5 5 5   '$ Reflexes are 1+ and symmetric at the biceps, triceps, brachioradialis, patella and achilles.   Hoffman's is absent.   Bilateral upper and lower extremity sensation is intact to light touch.    No evidence of dysmetria noted.  Gait is antalgic.     Medical Decision Making  Imaging: MRI L spine 08/08/2022 IMPRESSION: 1. L2-L3 mild spinal canal stenosis and severe left neural foraminal narrowing. The subarticular portion of a disc protrusion protrusion contacts and displaces the descending left L3 nerve roots. 2. L4-L5 mild left neural foraminal narrowing. Narrowing of the left lateral recess at this level could affect the descending left L5 nerve roots.     Electronically Signed   By: Merilyn Baba M.D.   On: 08/10/2022 01:48  I have personally reviewed the images and agree with the above interpretation.  Assessment and Plan: Ms. Hayter is a pleasant 52 y.o. female with severe degenerative disc disease from L1-3.  She has compression of the left L2 nerve root and L left L3 nerve root.  On her CT abdomen pelvis from earlier last year, she has severe degenerative disc disease at L1-2 and L2-3.  She also seems to be losing some of her normal lumbar lordosis at those levels.  I think she may end up being a  candidate for surgical intervention, but I would like to get plain flexion-extension x-rays today and get a DEXA study.  I will see her back in clinic to discuss once those are finished.  I  will also contact her cardiac doctors to see if she would be able to come off Eliquis at any point.  I spent a total of 30 minutes in this patient's care today. This time was spent reviewing pertinent records including imaging studies, obtaining and confirming history, performing a directed evaluation, formulating and discussing my recommendations, and documenting the visit within the medical record.      Thank you for involving me in the care of this patient.      Ivaan Liddy K. Izora Ribas MD, [facility] Neurosurgery

## 2022-10-10 ENCOUNTER — Encounter: Payer: Self-pay | Admitting: Neurosurgery

## 2022-10-10 ENCOUNTER — Ambulatory Visit (INDEPENDENT_AMBULATORY_CARE_PROVIDER_SITE_OTHER): Payer: 59 | Admitting: Neurosurgery

## 2022-10-10 VITALS — BP 142/97 | HR 86 | Ht 64.0 in | Wt 142.6 lb

## 2022-10-10 DIAGNOSIS — G8929 Other chronic pain: Secondary | ICD-10-CM

## 2022-10-10 DIAGNOSIS — M4310 Spondylolisthesis, site unspecified: Secondary | ICD-10-CM | POA: Diagnosis not present

## 2022-10-10 DIAGNOSIS — M5441 Lumbago with sciatica, right side: Secondary | ICD-10-CM | POA: Diagnosis not present

## 2022-10-10 DIAGNOSIS — M5442 Lumbago with sciatica, left side: Secondary | ICD-10-CM

## 2022-10-10 DIAGNOSIS — E2839 Other primary ovarian failure: Secondary | ICD-10-CM

## 2022-10-10 NOTE — Patient Instructions (Addendum)
You can get your xrays today Call [place] to schedule the bone density test: [phone]

## 2022-10-13 DIAGNOSIS — M5416 Radiculopathy, lumbar region: Secondary | ICD-10-CM | POA: Diagnosis not present

## 2022-10-13 DIAGNOSIS — M5126 Other intervertebral disc displacement, lumbar region: Secondary | ICD-10-CM | POA: Diagnosis not present

## 2022-10-16 ENCOUNTER — Other Ambulatory Visit: Payer: Self-pay | Admitting: Neurosurgery

## 2022-10-16 DIAGNOSIS — Z1231 Encounter for screening mammogram for malignant neoplasm of breast: Secondary | ICD-10-CM

## 2022-10-19 ENCOUNTER — Other Ambulatory Visit: Payer: Self-pay | Admitting: *Deleted

## 2022-10-19 ENCOUNTER — Encounter: Payer: Self-pay | Admitting: Cardiology

## 2022-10-19 DIAGNOSIS — I4819 Other persistent atrial fibrillation: Secondary | ICD-10-CM

## 2022-10-19 MED ORDER — APIXABAN 5 MG PO TABS: ORAL_TABLET | ORAL | 1 refills | Status: DC

## 2022-10-19 MED ORDER — MIDODRINE HCL 5 MG PO TABS: 15.0000 mg | ORAL_TABLET | Freq: Two times a day (BID) | ORAL | 7 refills | Status: DC

## 2022-10-19 NOTE — Telephone Encounter (Signed)
Eliquis '5mg'$  refill request received. Patient is 52 years old, weight-64.7kg, Crea-0.94 on 05/02/2022, Diagnosis-Afib, and last seen by Oda Kilts on 05/02/2022. Dose is appropriate based on dosing criteria. Will send in refill to requested pharmacy.

## 2022-10-24 DIAGNOSIS — R55 Syncope and collapse: Secondary | ICD-10-CM | POA: Diagnosis not present

## 2022-10-24 DIAGNOSIS — R69 Illness, unspecified: Secondary | ICD-10-CM | POA: Diagnosis not present

## 2022-10-24 DIAGNOSIS — I4891 Unspecified atrial fibrillation: Secondary | ICD-10-CM | POA: Diagnosis not present

## 2022-10-24 DIAGNOSIS — Z9889 Other specified postprocedural states: Secondary | ICD-10-CM | POA: Diagnosis not present

## 2022-10-24 DIAGNOSIS — Z8673 Personal history of transient ischemic attack (TIA), and cerebral infarction without residual deficits: Secondary | ICD-10-CM | POA: Diagnosis not present

## 2022-10-24 DIAGNOSIS — M5416 Radiculopathy, lumbar region: Secondary | ICD-10-CM | POA: Diagnosis not present

## 2022-10-24 NOTE — Progress Notes (Unsigned)
PCP:  Donnamarie Rossetti, PA-C Primary Cardiologist: [name], MD Electrophysiologist: Will Meredith Leeds, MD   Hannah Zimmerman is a 52 y.o. female seen today for Will Meredith Leeds, MD for {VISITTYPE:28148}  Past Medical History:  Diagnosis Date   (HFpEF) heart failure with preserved ejection fraction ([place])    a. 07/2019 Echo: EF 60-65%, mod LVH. Sev dil LA, mildly dil RA. Mod elev PASP; b. 07/2020 TEE: EF 50-55%, no rwma, sev conc LVH. Nl RV fxn. Mod dil RA. Mild-mod MR, mod TR.   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 07/2020 Cardiac CT: Cor Ca2+ = 338 (99th %'ile).   Complication of anesthesia 20 years ago   a. spinal with first c section went too high stopped breathing, low bp with 2nd c section, no further issues with anesthesia   Heart murmur    a. 07/2019 Echo: no significant valvular dzs; b. 07/2020 TEE: Mild-mod MR, mod TR.   History of hiatal hernia    Hypertrophic cardiomyopathy ([place])    a. 07/2019 Echo: Mod LVH; b. 01/2020 cMRI: EF 67%, HCM w/o obstruction. Max wall thickness 87m. Sev LAE w/ L->R atrial level shunt. Large area of LGE @ mid-ventricular level in area of max wall thickness; c. 07/2020 Cardiac CT: Asymm hypertrophy up to 144min mid inferoseptum consistent w/ known HCM.   Morbid obesity ([facility]   a. 09/2014 s/p gastric bypass.   Persistent atrial fibrillation ([facility]   a. Dx 07/2019-->CHA2DS2VASc = 2 (diast CHF/Fem)-->Eliquis; b. 09/2018 s/p DCCV (150J (biphasic) x 1); c. 12/2019 RFCA/PVI; d. 07/2020 repeat RFCA/PVI.   Rash    both arms from old bed bug bites, healing   [facility]   Tobacco abuse    Typical atrial flutter ([facility]   a. 12/2019 s/p RFCA.    Current Outpatient Medications  Medication Instructions   acetaminophen (TYLENOL) 1,000 mg, Oral, Every 6 hours PRN   apixaban (ELIQUIS) 5 MG TABS tablet TAKE 1 TABLET(5 MG) BY MOUTH TWICE DAILY   b complex vitamins capsule 1 capsule, Oral, Daily   Cholecalciferol  (VITAMIN D3 PO) 1 tablet, Oral, Every morning   cyclobenzaprine (FLEXERIL) 10 mg, Oral, 2 times daily PRN, 1/2 - 1 po bid prn   furosemide (LASIX) 40 MG tablet Take 1 tablet once daily for 7 days ,then stop   midodrine (PROAMATINE) 15 mg, Oral, 2 times daily with meals   pantoprazole (PROTONIX) 20 mg, Oral, Daily   potassium chloride SA (KLOR-CON M20) 20 MEQ tablet Take 1 tablet once daily, with Lasix, for 7 days   pravastatin (PRAVACHOL) 40 mg, Oral, Daily   rOPINIRole (REQUIP) 0.5 mg, Oral, 3 times daily    Physical Exam: There were no vitals filed for this visit.  GEN- NAD. A&O x 3. Normal affect. HEENT: normocephalic, atraumatic Lungs- CTAB, Normal effort Heart- {EPRHYTHM:28826}, No M/G/R Extremities- {EDEMA LEVEL:28147::"No"} peripheral edema. no clubbing or cyanosis; Skin- warm and dry, no rash or lesion  {EKGtoday:28818::"EKG is not ordered today"}  Additional studies reviewed include: Previous EP notes.   {Select studies to display:26339}  Assessment and Plan:  1.  Persistent atrial fibrillation/flutter: EKG today shows *** Continue Eliquis 5 mg twice daily for CHA2DS2-VASc of 3. She is status post ablation x2 as well as surgical maze with left atrial appendage clipping. EKG today shows she remains in junctional rhythm   2. Chronic diastolic CHF Volume status ***  Continue lasix 20 mg as  needed.     3. Tobacco abuse Cessation Encouraged   4. Morbid Obesity Post gastric bypass There is no height or weight on file to calculate BMI.    5.  Hypertrophic cardiomyopathy:  Found on cardiac MRI.  Has LGE.  Management per primary cardiology.   6.  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  Continue with current management.  Follow up with {FKCLE:75170} {EPFOLLOW YF:74944}  Shirley Friar, PA-C  10/24/22 11:39 AM

## 2022-10-25 ENCOUNTER — Encounter: Payer: Self-pay | Admitting: Student

## 2022-10-25 ENCOUNTER — Ambulatory Visit: Payer: 59 | Attending: Student | Admitting: Student

## 2022-10-25 VITALS — BP 126/90 | HR 88 | Ht 64.0 in | Wt 142.4 lb

## 2022-10-25 DIAGNOSIS — I480 Paroxysmal atrial fibrillation: Secondary | ICD-10-CM

## 2022-10-25 DIAGNOSIS — D6869 Other thrombophilia: Secondary | ICD-10-CM

## 2022-10-25 DIAGNOSIS — I4819 Other persistent atrial fibrillation: Secondary | ICD-10-CM

## 2022-10-25 LAB — CBC

## 2022-10-25 NOTE — Patient Instructions (Signed)
Medication Instructions:  Your physician recommends that you continue on your current medications as directed. Please refer to the Current Medication list given to you today.  *If you need a refill on your cardiac medications before your next appointment, please call your pharmacy*   Lab Work: TODAY: BMET, CBC  If you have labs (blood work) drawn today and your tests are completely normal, you will receive your results only by: [place] (if you have MyChart) OR A paper copy in the mail If you have any lab test that is abnormal or we need to change your treatment, we will call you to review the results.   Follow-Up: At [facility], you and your health needs are our priority.  As part of our continuing mission to provide you with exceptional heart care, we have created designated Provider Care Teams.  These Care Teams include your primary Cardiologist (physician) and Advanced Practice Providers (APPs -  Physician Assistants and Nurse Practitioners) who all work together to provide you with the care you need, when you need it.   Your next appointment:   12/01/22 with Dr. Curt Bears   Other Instructions  ZIO AT Long term monitor-Live Telemetry  Your physician has requested you wear a ZIO patch monitor for 7 days.  This is a single patch monitor. [employer] supplies one patch monitor per enrollment. Additional  stickers are not available.  Please do not apply patch if you will be having a Nuclear Stress Test, Echocardiogram, Cardiac CT, MRI,  or Chest Xray during the period you would be wearing the monitor. The patch cannot be worn during  these tests. You cannot remove and re-apply the ZIO AT patch monitor.  Your ZIO patch monitor will be mailed 3 day USPS to your address on file. It may take 3-5 days to  receive your monitor after you have been enrolled.  Once you have received your monitor, please review the enclosed instructions. Your monitor has  already been registered  assigning a specific monitor serial # to you.   Billing and Patient Assistance Program information  Hannah Zimmerman has been supplied with any insurance information on record for billing. [employer] offers a sliding scale Patient Assistance Program for patients without insurance, or whose  insurance does not completely cover the cost of the ZIO patch monitor. You must apply for the  Patient Assistance Program to qualify for the discounted rate. To apply, call [employer] at [phone],  select option 4, select option 2 , ask to apply for the Patient Assistance Program, (you can request an  interpreter if needed). [employer] will ask your household income and how many people are in your  household. [employer] will quote your out-of-pocket cost based on this information. They will also be able  to set up a 12 month interest free payment plan if needed.  Applying the monitor   Shave hair from upper left chest.  Hold the abrader disc by orange tab. Rub the abrader in 40 strokes over left upper chest as indicated in  your monitor instructions.  Clean area with 4 enclosed alcohol pads. Use all pads to ensure the area is cleaned thoroughly. Let  dry.  Apply patch as indicated in monitor instructions. Patch will be placed under collarbone on left side of  chest with arrow pointing upward.  Rub patch adhesive wings for 2 minutes. Remove the white label marked "1". Remove the white label  marked "2". Rub patch adhesive wings for 2 additional minutes.  While looking in  a mirror, press and release button in center of patch. A small green light will flash 3-4  times. This will be your only indicator that the monitor has been turned on.  Do not shower for the first 24 hours. You may shower after the first 24 hours.  Press the button if you feel a symptom. You will hear a small click. Record Date, Time and Symptom in  the Patient Log.   Starting the Gateway  In your kit there is a Hydrographic surveyor box the size of a  cellphone. This is Airline pilot. It transmits all your  recorded data to [facility]. This box must always stay within 10 feet of you. Open the box and push the *  button. There will be a light that blinks orange and then green a few times. When the light stops  blinking, the Gateway is connected to the ZIO patch. Call [employer] at [phone] to confirm your monitor is transmitting.  Returning your monitor  Remove your patch and place it inside the [place]. In the lower half of the Gateway there is a white  bag with prepaid postage on it. Place Gateway in bag and seal. Mail package back to [place] as soon as  possible. Your physician should have your final report approximately 7 days after you have mailed back  your monitor. Call [place] at [phone] if you have questions regarding your ZIO AT  patch monitor. Call them immediately if you see an orange light blinking on your monitor.  If your monitor falls off in less than 4 days, contact our Monitor department at [phone]. If your  monitor becomes loose or falls off after 4 days call [employer] at [phone] for suggestions on  securing your monitor

## 2022-10-26 LAB — BASIC METABOLIC PANEL
BUN/Creatinine Ratio: 18 (ref 9–23)
BUN: 14 mg/dL (ref 6–24)
CO2: 21 mmol/L (ref 20–29)
Calcium: 9.5 mg/dL (ref 8.7–10.2)
Chloride: 106 mmol/L (ref 96–106)
Creatinine, Ser: 0.8 mg/dL (ref 0.57–1.00)
Glucose: 85 mg/dL (ref 70–99)
Potassium: 3.9 mmol/L (ref 3.5–5.2)
Sodium: 142 mmol/L (ref 134–144)
eGFR: 89 mL/min/{1.73_m2} (ref >59)

## 2022-10-26 LAB — CBC
Hematocrit: 36.4 % (ref 34.0–46.6)
Hemoglobin: 11.5 g/dL (ref 11.1–15.9)
MCH: 28.7 pg (ref 26.6–33.0)
MCHC: 31.6 g/dL (ref 31.5–35.7)
MCV: 91 fL (ref 79–97)
Platelets: 365 10*3/uL (ref 150–450)
RBC: 4.01 x10E6/uL (ref 3.77–5.28)
RDW: 15.6 % — ABNORMAL HIGH (ref 11.7–15.4)
WBC: 7.2 10*3/uL (ref 3.4–10.8)

## 2022-10-29 ENCOUNTER — Other Ambulatory Visit: Payer: Self-pay

## 2022-10-29 ENCOUNTER — Emergency Department: Payer: 59

## 2022-10-29 ENCOUNTER — Inpatient Hospital Stay
Admission: EM | Admit: 2022-10-29 | Discharge: 2022-10-31 | DRG: 281 | Disposition: A | Discharge status: Home / Self Care | Payer: 59 | Attending: Family Medicine | Admitting: Family Medicine

## 2022-10-29 DIAGNOSIS — Z833 Family history of diabetes mellitus: Secondary | ICD-10-CM

## 2022-10-29 DIAGNOSIS — I2489 Other forms of acute ischemic heart disease: Secondary | ICD-10-CM

## 2022-10-29 DIAGNOSIS — E119 Type 2 diabetes mellitus without complications: Secondary | ICD-10-CM | POA: Diagnosis present

## 2022-10-29 DIAGNOSIS — Z8249 Family history of ischemic heart disease and other diseases of the circulatory system: Secondary | ICD-10-CM

## 2022-10-29 DIAGNOSIS — I482 Chronic atrial fibrillation, unspecified: Secondary | ICD-10-CM | POA: Diagnosis present

## 2022-10-29 DIAGNOSIS — I639 Cerebral infarction, unspecified: Secondary | ICD-10-CM | POA: Diagnosis not present

## 2022-10-29 DIAGNOSIS — E876 Hypokalemia: Secondary | ICD-10-CM | POA: Diagnosis present

## 2022-10-29 DIAGNOSIS — Z885 Allergy status to narcotic agent status: Secondary | ICD-10-CM

## 2022-10-29 DIAGNOSIS — I5032 Chronic diastolic (congestive) heart failure: Secondary | ICD-10-CM | POA: Diagnosis not present

## 2022-10-29 DIAGNOSIS — I493 Ventricular premature depolarization: Secondary | ICD-10-CM | POA: Diagnosis present

## 2022-10-29 DIAGNOSIS — I214 Non-ST elevation (NSTEMI) myocardial infarction: Principal | ICD-10-CM | POA: Diagnosis present

## 2022-10-29 DIAGNOSIS — Z8673 Personal history of transient ischemic attack (TIA), and cerebral infarction without residual deficits: Secondary | ICD-10-CM

## 2022-10-29 DIAGNOSIS — E785 Hyperlipidemia, unspecified: Secondary | ICD-10-CM | POA: Diagnosis not present

## 2022-10-29 DIAGNOSIS — Z888 Allergy status to other drugs, medicaments and biological substances status: Secondary | ICD-10-CM

## 2022-10-29 DIAGNOSIS — I2510 Atherosclerotic heart disease of native coronary artery without angina pectoris: Secondary | ICD-10-CM | POA: Diagnosis present

## 2022-10-29 DIAGNOSIS — I110 Hypertensive heart disease with heart failure: Secondary | ICD-10-CM | POA: Diagnosis present

## 2022-10-29 DIAGNOSIS — Z90710 Acquired absence of both cervix and uterus: Secondary | ICD-10-CM

## 2022-10-29 DIAGNOSIS — Z7901 Long term (current) use of anticoagulants: Secondary | ICD-10-CM

## 2022-10-29 DIAGNOSIS — Z87891 Personal history of nicotine dependence: Secondary | ICD-10-CM

## 2022-10-29 DIAGNOSIS — R079 Chest pain, unspecified: Principal | ICD-10-CM

## 2022-10-29 DIAGNOSIS — Z9884 Bariatric surgery status: Secondary | ICD-10-CM

## 2022-10-29 DIAGNOSIS — R0789 Other chest pain: Secondary | ICD-10-CM | POA: Diagnosis not present

## 2022-10-29 DIAGNOSIS — Z79899 Other long term (current) drug therapy: Secondary | ICD-10-CM

## 2022-10-29 DIAGNOSIS — I4819 Other persistent atrial fibrillation: Secondary | ICD-10-CM | POA: Diagnosis present

## 2022-10-29 DIAGNOSIS — I252 Old myocardial infarction: Secondary | ICD-10-CM

## 2022-10-29 DIAGNOSIS — F32A Depression, unspecified: Secondary | ICD-10-CM | POA: Diagnosis present

## 2022-10-29 LAB — BASIC METABOLIC PANEL
Anion gap: 11 (ref 5–15)
BUN: 19 mg/dL (ref 6–20)
CO2: 23 mmol/L (ref 22–32)
Calcium: 9.4 mg/dL (ref 8.9–10.3)
Chloride: 107 mmol/L (ref 98–111)
Creatinine, Ser: 0.92 mg/dL (ref 0.44–1.00)
GFR, Estimated: >60 mL/min (ref >60)
Glucose, Bld: 90 mg/dL (ref 70–99)
Potassium: 3.5 mmol/L (ref 3.5–5.1)
Sodium: 141 mmol/L (ref 135–145)

## 2022-10-29 LAB — CBC
HCT: 36.6 % (ref 36.0–46.0)
Hemoglobin: 11.9 g/dL — ABNORMAL LOW (ref 12.0–15.0)
MCH: 29.2 pg (ref 26.0–34.0)
MCHC: 32.5 g/dL (ref 30.0–36.0)
MCV: 89.7 fL (ref 80.0–100.0)
Platelets: 340 10*3/uL (ref 150–400)
RBC: 4.08 MIL/uL (ref 3.87–5.11)
RDW: 17.6 % — ABNORMAL HIGH (ref 11.5–15.5)
WBC: 8.5 10*3/uL (ref 4.0–10.5)
nRBC: 0 % (ref 0.0–0.2)

## 2022-10-29 LAB — BRAIN NATRIURETIC PEPTIDE: B Natriuretic Peptide: 439.5 pg/mL — ABNORMAL HIGH (ref 0.0–100.0)

## 2022-10-29 LAB — LIPID PANEL
Cholesterol: 153 mg/dL (ref 0–200)
HDL: 71 mg/dL (ref >40)
LDL Cholesterol: 75 mg/dL (ref 0–99)
Total CHOL/HDL Ratio: 2.2 RATIO
Triglycerides: 35 mg/dL (ref <150)
VLDL: 7 mg/dL (ref 0–40)

## 2022-10-29 LAB — TROPONIN I (HIGH SENSITIVITY)
Troponin I (High Sensitivity): 41 ng/L — ABNORMAL HIGH (ref <18)
Troponin I (High Sensitivity): 40 ng/L — ABNORMAL HIGH (ref <18)
Troponin I (High Sensitivity): 44 ng/L — ABNORMAL HIGH (ref <18)

## 2022-10-29 MED ORDER — VITAMIN B-12 100 MCG PO TABS
100.0000 ug | ORAL_TABLET | Freq: Every day | ORAL | Status: DC
  Administered 2022-10-30 – 2022-10-31 (×2): 100 ug via ORAL
  Filled 2022-10-29 (×2): qty 1

## 2022-10-29 MED ORDER — FUROSEMIDE 40 MG PO TABS
40.0000 mg | ORAL_TABLET | Freq: Every day | ORAL | Status: DC
  Administered 2022-10-30 – 2022-10-31 (×2): 40 mg via ORAL
  Filled 2022-10-29 (×2): qty 1

## 2022-10-29 MED ORDER — FENTANYL CITRATE PF 50 MCG/ML IJ SOSY: 25.0000 ug | PREFILLED_SYRINGE | INTRAMUSCULAR | Status: DC | PRN

## 2022-10-29 MED ORDER — HYDRALAZINE HCL 20 MG/ML IJ SOLN: 5.0000 mg | INTRAMUSCULAR | Status: DC | PRN

## 2022-10-29 MED ORDER — PRAVASTATIN SODIUM 20 MG PO TABS
20.0000 mg | ORAL_TABLET | Freq: Every day | ORAL | Status: DC
  Administered 2022-10-29 – 2022-10-31 (×3): 20 mg via ORAL
  Filled 2022-10-29 (×3): qty 1

## 2022-10-29 MED ORDER — ASPIRIN 81 MG PO TBEC
81.0000 mg | DELAYED_RELEASE_TABLET | Freq: Every day | ORAL | Status: DC
  Administered 2022-10-30 – 2022-10-31 (×2): 81 mg via ORAL
  Filled 2022-10-29 (×2): qty 1

## 2022-10-29 MED ORDER — ROPINIROLE HCL 1 MG PO TABS
0.5000 mg | ORAL_TABLET | Freq: Three times a day (TID) | ORAL | Status: DC
  Administered 2022-10-29 – 2022-10-31 (×4): 0.5 mg via ORAL
  Filled 2022-10-29 (×2): qty 1
  Filled 2022-10-29: qty 2
  Filled 2022-10-29: qty 1
  Filled 2022-10-29: qty 2

## 2022-10-29 MED ORDER — ACETAMINOPHEN 325 MG PO TABS
650.0000 mg | ORAL_TABLET | Freq: Four times a day (QID) | ORAL | Status: DC | PRN
  Administered 2022-10-29 – 2022-10-31 (×4): 650 mg via ORAL
  Filled 2022-10-29 (×5): qty 2

## 2022-10-29 MED ORDER — NITROGLYCERIN 0.4 MG SL SUBL: 0.4000 mg | SUBLINGUAL_TABLET | SUBLINGUAL | Status: DC | PRN

## 2022-10-29 MED ORDER — MORPHINE SULFATE (PF) 2 MG/ML IV SOLN: 2.0000 mg | INTRAVENOUS | Status: DC | PRN

## 2022-10-29 MED ORDER — NITROGLYCERIN 2 % TD OINT
0.5000 [in_us] | TOPICAL_OINTMENT | TRANSDERMAL | Status: AC
  Administered 2022-10-29: 0.5 [in_us] via TOPICAL
  Filled 2022-10-29 (×2): qty 1

## 2022-10-29 MED ORDER — ONDANSETRON HCL 4 MG/2ML IJ SOLN
4.0000 mg | Freq: Three times a day (TID) | INTRAMUSCULAR | Status: DC | PRN
  Administered 2022-10-30: 4 mg via INTRAVENOUS
  Filled 2022-10-29: qty 2

## 2022-10-29 MED ORDER — PANTOPRAZOLE SODIUM 20 MG PO TBEC
20.0000 mg | DELAYED_RELEASE_TABLET | Freq: Every day | ORAL | Status: DC
  Administered 2022-10-29 – 2022-10-31 (×3): 20 mg via ORAL
  Filled 2022-10-29 (×3): qty 1

## 2022-10-29 MED ORDER — B COMPLEX VITAMINS PO CAPS: 1.0000 | ORAL_CAPSULE | Freq: Every day | ORAL | Status: DC

## 2022-10-29 MED ORDER — HEPARIN (PORCINE) 25000 UT/250ML-% IV SOLN
800.0000 [IU]/h | INTRAVENOUS | Status: DC
  Administered 2022-10-29: 900 [IU]/h via INTRAVENOUS
  Filled 2022-10-29: qty 250

## 2022-10-29 MED ORDER — CYCLOBENZAPRINE HCL 10 MG PO TABS
10.0000 mg | ORAL_TABLET | Freq: Two times a day (BID) | ORAL | Status: DC | PRN
  Filled 2022-10-29: qty 1

## 2022-10-29 MED ORDER — VITAMIN D 25 MCG (1000 UNIT) PO TABS
1000.0000 [IU] | ORAL_TABLET | Freq: Every day | ORAL | Status: DC
  Administered 2022-10-29 – 2022-10-31 (×3): 1000 [IU] via ORAL
  Filled 2022-10-29 (×3): qty 1

## 2022-10-29 MED ORDER — HEPARIN BOLUS VIA INFUSION
4000.0000 [IU] | Freq: Once | INTRAVENOUS | Status: AC
  Administered 2022-10-29: 4000 [IU] via INTRAVENOUS
  Filled 2022-10-29: qty 4000

## 2022-10-29 MED ORDER — MIDODRINE HCL 5 MG PO TABS
15.0000 mg | ORAL_TABLET | Freq: Two times a day (BID) | ORAL | Status: DC
  Administered 2022-10-29 – 2022-10-31 (×3): 15 mg via ORAL
  Filled 2022-10-29 (×3): qty 3

## 2022-10-29 NOTE — ED Notes (Signed)
Pt up to restroom, steady on feet, no assistance required.

## 2022-10-29 NOTE — Consult Note (Signed)
ANTICOAGULATION CONSULT NOTE - Initial Consult  Pharmacy Consult for heparin infusion Indication: chest pain/ACS  Allergies  Allergen Reactions   Atorvastatin Nausea Only   Codeine Hives        Hydrocodone Hives    Patient Measurements: Height: 5' 4"$  (162.6 cm) Weight: 64.5 kg (142 lb 3.2 oz) IBW/kg (Calculated) : 54.7 Heparin Dosing Weight: 64.5 kg  Vital Signs: Temp: 98 F (36.7 C) (02/11 1454) Temp Source: Oral (02/11 1454) BP: 126/87 (02/11 1454) Pulse Rate: 101 (02/11 1454)  Labs: Recent Labs    10/29/22 1514  HGB 11.9*  HCT 36.6  PLT 340  CREATININE 0.92  TROPONINIHS 41*    Estimated Creatinine Clearance: 62.5 mL/min (by C-G formula based on SCr of 0.92 mg/dL).   Medical History: Past Medical History:  Diagnosis Date   (HFpEF) heart failure with preserved ejection fraction ([place])    a. 07/2019 Echo: EF 60-65%, mod LVH. Sev dil LA, mildly dil RA. Mod elev PASP; b. 07/2020 TEE: EF 50-55%, no rwma, sev conc LVH. Nl RV fxn. Mod dil RA. Mild-mod MR, mod TR.   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 07/2020 Cardiac CT: Cor Ca2+ = 338 (99th %'ile).   Complication of anesthesia 20 years ago   a. spinal with first c section went too high stopped breathing, low bp with 2nd c section, no further issues with anesthesia   Heart murmur    a. 07/2019 Echo: no significant valvular dzs; b. 07/2020 TEE: Mild-mod MR, mod TR.   History of hiatal hernia    Hypertrophic cardiomyopathy ([place])    a. 07/2019 Echo: Mod LVH; b. 01/2020 cMRI: EF 67%, HCM w/o obstruction. Max wall thickness 10m. Sev LAE w/ L->R atrial level shunt. Large area of LGE @ mid-ventricular level in area of max wall thickness; c. 07/2020 Cardiac CT: Asymm hypertrophy up to 110min mid inferoseptum consistent w/ known HCM.   Morbid obesity ([facility]   a. 09/2014 s/p gastric bypass.   Persistent atrial fibrillation ([facility]   a. Dx 07/2019-->CHA2DS2VASc = 2 (diast CHF/Fem)-->Eliquis; b.  09/2018 s/p DCCV (150J (biphasic) x 1); c. 12/2019 RFCA/PVI; d. 07/2020 repeat RFCA/PVI.   Rash    both arms from old bed bug bites, healing   [facility]   Tobacco abuse    Typical atrial flutter ([place]   a. 12/2019 s/p RFCA.    Medications:  PTA: Eliquis 70m18mID (last dose 2/11 at 0700)  Inpatient: Heparin infusion (2/11 >>) Allergies: NO AC/APT related allergies  Assessment: [age]ar old female with history of atrial fibirillation on eliquis, presents to ED with complaints of chest pain. Pharmacy consulted for management of heparin infusion in the setting of suspected NSTEMI.   Date Time aPTT/HL Rate/Comment   Goal of Therapy:  Heparin level 0.3-0.7 units/ml aPTT 66-102 seconds Monitor platelets by anticoagulation protocol: Yes   Plan:  Order STAT aPTT Give 4000 units bolus x1; then start heparin infusion at 900 units/hr Check aPTT/Anti-Xa level in 6 hours and daily once consecutively therapeutic.  Titrate by aPTT's until lab correlation is noted, then titrate by anti-xa alone. Continue to monitor H&H and platelets daily while on heparin gtt.       AndChandlerarmacist 10/29/2022 4:46 PM

## 2022-10-29 NOTE — ED Notes (Addendum)
Pt complaining of CP under left breast and asking for pain meds. Pt chose tylenol over fentanyl stating " the fentanyl will knock me out, lets do the tylenol."

## 2022-10-29 NOTE — ED Provider Notes (Signed)
[facility] Provider Note    Event Date/Time   First MD Initiated Contact with Patient 10/29/22 1519     (approximate)   History   Chest Pain   HPI  Hannah Zimmerman is a 52 y.o. female history of heart failure, hypertrophic cardiomyopathy history of atrial fibrillation currently on Eliquis.  Notation from cardiology EKG from the seventh notes accelerated junctional rhythm, but in their note they indicate the EKG shows normal sinus rhythm  Abrupt onset of a pain below her left breast followed by a pressure sensation across her upper chest and left arm.  Took 324 mg of aspirin remains on her Eliquis.  Currently pain is mild pressure feeling.  Seems to be improving.  Abdominal pain no trouble breathing   Reviewed cardiology note from February 7  Physical Exam   Triage Vital Signs: ED Triage Vitals  Enc Vitals Group     BP 10/29/22 1454 126/87     Pulse Rate 10/29/22 1454 (!) 101     Resp 10/29/22 1454 20     Temp 10/29/22 1454 98 F (36.7 C)     Temp Source 10/29/22 1454 Oral     SpO2 10/29/22 1454 98 %     Weight 10/29/22 1451 142 lb 3.2 oz (64.5 kg)     Height 10/29/22 1451 5' 4"$  (1.626 m)     Head Circumference --      Peak Flow --      Pain Score 10/29/22 1450 0     Pain Loc --      Pain Edu? --      Excl. in [place]? --     Most recent vital signs: Vitals:   10/29/22 1454  BP: 126/87  Pulse: (!) 101  Resp: 20  Temp: 98 F (36.7 C)  SpO2: 98%     General: Awake, no distress.  CV:  Good peripheral perfusion.  Normal tones slight irregularity Resp:  Normal effort.  Clear bilateral Abd:  No distention.  Soft nontender nondistended Other:  Mild edema in the lower feet.  Patient reports she has gained about 5 pounds of what she believes to be water weight in the last day   ED Results / Procedures / Treatments   Labs (all labs ordered are listed, but only abnormal results are displayed) Labs Reviewed  CBC - Abnormal; Notable for  the following components:      Result Value   Hemoglobin 11.9 (*)    RDW 17.6 (*)    All other components within normal limits  TROPONIN I (HIGH SENSITIVITY) - Abnormal; Notable for the following components:   Troponin I (High Sensitivity) 41 (*)    All other components within normal limits  BASIC METABOLIC PANEL  HEMOGLOBIN A1C  LIPID PANEL  URINE DRUG SCREEN, QUALITATIVE ([facility] ONLY)  BRAIN NATRIURETIC PEPTIDE  PROTIME-INR  APTT  APTT  POC URINE PREG, ED  TROPONIN I (HIGH SENSITIVITY)     EKG  EKG interpreted by me at 1459 Heart rate 105 QRS 100 QTc 480 Atrial fibrillation, some baseline artifact but no findings suggestive of possible ischemic abnormality including T wave inversions and depressions in the lateral precordial leads as well as some mild elevation of aVR.  No STEMI but potential underlying concern for ischemia.   Compared to previous EKG from February 7 appears similar, at that time it was interpreted as accelerated junctional rhythm. mild changes apparent, incluidng AVR today   Repeat EKG interpreted by me  at 1534 heart rate 90 QRS 100 QTc 450 Probable junctional rhythm.  Significant repolarization abnormality, no STEMI.  Compared to previous EKGs this appears to be more approximating her typical baseline.  Elevation in aVR has improved   RADIOLOGY  Chest x-ray interpreted by me as negative for acute finding   PROCEDURES:  Critical Care performed: No  Procedures   MEDICATIONS ORDERED IN ED: Medications  fentaNYL (SUBLIMAZE) injection 25 mcg (has no administration in time range)  nitroGLYCERIN (NITROSTAT) SL tablet 0.4 mg (has no administration in time range)  ondansetron (ZOFRAN) injection 4 mg (has no administration in time range)  acetaminophen (TYLENOL) tablet 650 mg (650 mg Oral Given 10/29/22 1624)  hydrALAZINE (APRESOLINE) injection 5 mg (has no administration in time range)  heparin bolus via infusion 4,000 Units (has no administration in time  range)  heparin ADULT infusion 100 units/mL (25000 units/274m) (has no administration in time range)  nitroGLYCERIN (NITROGLYN) 2 % ointment 0.5 inch (0.5 inches Topical Given 10/29/22 1608)     IMPRESSION / MDM / ASSESSMENT AND PLAN / ED COURSE  I reviewed the triage vital signs and the nursing notes.                              Differential diagnosis includes, but is not limited to, ACS, angina, NSTEMI, etc.  Her symptoms seem to be highly concerning for cardiac cause.  No ripping tearing or moving pain.  Noted hypoxia, no shortness of breath.  She is anticoagulated making pulmonary embolism highly unlikely.  She has a history of known cardiac disease but has never had any sort of STEMI.  Her EKG is concerning with some EKG abnormalities apparent somewhat in aVR, and somewhat nonspecific but different and a departure from her typical baseline  Patient anticoagulated, has taken salicylate.  Will apply small amount of Nitropaste  Patient's presentation is most consistent with acute presentation with potential threat to life or bodily function.   The patient is on the cardiac monitor to evaluate for evidence of arrhythmia and/or significant heart rate changes.   Patient's troponin is elevated.  Historically has had elevations in her troponin, prior 1 in the 20s though and today of 40 with EKG change suggestive of potential developing NSTEMI or other ACS, unstable angina etc.  Is understanding and agreeable with plan for admission.  She is currently pain and symptom free as of 4:10 PM.  Will admit with anticipation of cardiology consultation.  Discussed case with hospitalist Dr. NBlaine HamperWho accepts admission     FINAL CLINICAL IMPRESSION(S) / ED DIAGNOSES   Final diagnoses:  Chest pain with high risk for cardiac etiology     Rx / DC Orders   ED Discharge Orders     None        Note:  This document was prepared using Dragon voice recognition software and may include  unintentional dictation errors.   QDelman Kitten MD 10/29/22 [phone]

## 2022-10-29 NOTE — ED Triage Notes (Signed)
Pt to ED for chest pain that started under L breast 1 hour ago then moved to both shoulders and neck. Describes as tightness. Also nauseous. Hx stroke 2/23. Has a fib, takes eliquis. Also extensive cardiac history but no hx MI.   Was slightly dizzy this AM, not now. Respirations unlabored, skin dry.   Pt roomed.

## 2022-10-29 NOTE — H&P (Signed)
History and Physical    Hannah Zimmerman [id number] DOB: 1971/09/02 DOA: 10/29/2022  Referring MD/NP/PA:   PCP: Donnamarie Rossetti, PA-C   Patient coming from:  The patient is coming from home.  At baseline, pt is independent for most of ADL.        Chief Complaint: chest pain  HPI: Hannah Zimmerman is a 52 y.o. female with medical history significant of A fib on Eliquis (s/p of Maze), HLD, stroke, dCHF, pulmonary HTN, CAD, depression, s/p of sleeve gastrectomy, presents with chest pain.  Patient states that her chest pain started this afternoon, which is located left side of her chest under her breast, tightness of feeding, 4 out of 10 severity, radiating to both shoulders and neck, not pleuritic, not aggravated by deep breath.  No chest wall tenderness. Denies cough, shortness breath, fever or chills.  No recent long plane traveling.  Denies nausea, vomiting, diarrhea or abdominal pain.  No symptoms of UTI.  No recent fall or head injury.  No rectal bleeding or dark stool. Per EDP's note, pt took 324 mg of ASA.  Data reviewed independently and ED Course: pt was found to have troponin 41, WBC 8.5, GFR> 60, temperature normal, blood pressure 126/87, heart rate 101, RR 20, oxygen saturation 98% on room air.  Chest x-ray negative.  Patient is placed on telemetry bed for observation. Dr. Rockey Situ of card is consulted.   EKG: I have personally reviewed.  First EKG has unstable baseline, has a PAC.  The repeated EKG seems to have a sinus rhythm, QTc 483, diffused T wave inversion in lateral leads, V4-V6 and inferior leads.   Review of Systems:   General: no fevers, chills, no body weight gain, fatigue HEENT: no blurry vision, hearing changes or sore throat Respiratory: no dyspnea, coughing, wheezing CV: has chest pain, no palpitations GI: no nausea, vomiting, abdominal pain, diarrhea, constipation GU: no dysuria, burning on urination, increased urinary frequency, hematuria  Ext: has  trace leg edema Neuro: no unilateral weakness, numbness, or tingling, no vision change or hearing loss Skin: no rash, no skin tear. MSK: No muscle spasm, no deformity, no limitation of range of movement in spin Heme: No easy bruising.  Travel history: No recent long distant travel.   Allergy:  Allergies  Allergen Reactions   Atorvastatin Nausea Only   Codeine Hives        Hydrocodone Hives    Past Medical History:  Diagnosis Date   (HFpEF) heart failure with preserved ejection fraction ([place])    a. 07/2019 Echo: EF 60-65%, mod LVH. Sev dil LA, mildly dil RA. Mod elev PASP; b. 07/2020 TEE: EF 50-55%, no rwma, sev conc LVH. Nl RV fxn. Mod dil RA. Mild-mod MR, mod TR.   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 07/2020 Cardiac CT: Cor Ca2+ = 338 (99th %'ile).   Complication of anesthesia 20 years ago   a. spinal with first c section went too high stopped breathing, low bp with 2nd c section, no further issues with anesthesia   Heart murmur    a. 07/2019 Echo: no significant valvular dzs; b. 07/2020 TEE: Mild-mod MR, mod TR.   History of hiatal hernia    Hypertrophic cardiomyopathy ([place])    a. 07/2019 Echo: Mod LVH; b. 01/2020 cMRI: EF 67%, HCM w/o obstruction. Max wall thickness 60m. Sev LAE w/ L->R atrial level shunt. Large area of LGE @ mid-ventricular level in area of max wall  thickness; c. 07/2020 Cardiac CT: Asymm hypertrophy up to 50m in mid inferoseptum consistent w/ known HCM.   Morbid obesity ([place]    a. 09/2014 s/p gastric bypass.   Persistent atrial fibrillation ([place]    a. Dx 07/2019-->CHA2DS2VASc = 2 (diast CHF/Fem)-->Eliquis; b. 09/2018 s/p DCCV (150J (biphasic) x 1); c. 12/2019 RFCA/PVI; d. 07/2020 repeat RFCA/PVI.   Rash    both arms from old bed bug bites, healing   Stroke ([facility]    Tobacco abuse    Typical atrial flutter ([place]    a. 12/2019 s/p RFCA.    Past Surgical History:  Procedure Laterality Date   ABDOMINAL HYSTERECTOMY     ANKLE  SURGERY     left x 2   ATRIAL FIBRILLATION ABLATION N/A 12/24/2019   Procedure: ATRIAL FIBRILLATION ABLATION;  Surgeon: CConstance Haw MD;  [place];  Service: Cardiovascular;  Laterality: N/A;   ATRIAL FIBRILLATION ABLATION N/A 08/11/2020   Procedure: ATRIAL FIBRILLATION ABLATION;  Surgeon: CConstance Haw MD;  [facility];  Service: Cardiovascular;  Laterality: N/A;   CARDIOVERSION N/A 09/24/2019   Procedure: CARDIOVERSION;  Surgeon: GMinna Merritts MD;  [facility];  Service: Cardiovascular;  Laterality: N/A;   CARDIOVERSION N/A 10/17/2019   Procedure: CARDIOVERSION;  Surgeon: GMinna Merritts MD;  Location: [facility];  Service: Cardiovascular;  Laterality: N/A;   CARDIOVERSION N/A 11/08/2021   Procedure: CARDIOVERSION;  Surgeon: GMinna Merritts MD;  Location: [facility];  Service: Cardiovascular;  Laterality: N/A;   CESAREAN SECTION     x 3   CLIPPING OF ATRIAL APPENDAGE N/A 03/06/2022   Procedure: CLIPPING OF ATRIAL APPENDAGE USING ATRICURE  CLIP SIZE 45MM;  Surgeon: HMelrose Nakayama MD;  [facility]  Service: Open Heart Surgery;  Laterality: N/A;   EXCISION OF ATRIAL MYXOMA N/A 03/06/2022   Procedure: RESECTION OF AORTIC VALVE TUMOR;  Surgeon: HMelrose Nakayama MD;  [facility]  Service: Open Heart Surgery;  Laterality: N/A;   HERNIA REPAIR     x2 one was for sure a hiatal hernia   IR ANGIO EXTRACRAN SEL COM CAROTID INNOMINATE UNI L MOD SED  11/13/2021   IR CT HEAD LTD  11/13/2021   IR PERCUTANEOUS ART THROMBECTOMY/INFUSION INTRACRANIAL INC DIAG ANGIO  11/13/2021   IR THORACENTESIS ASP PLEURAL SPACE W/IMG GUIDE  03/14/2022   LAPAROSCOPIC GASTRIC SLEEVE RESECTION  2020   LAPAROSCOPIC GASTRIC SLEEVE RESECTION WITH HIATAL HERNIA REPAIR  08/17/2015   Procedure: LAPAROSCOPIC GASTRIC SLEEVE RESECTION WITH HIATAL HERNIA REPAIR;  Surgeon: MJohnathan Hausen MD;  [facility];  Service: General;;   MAZE N/A  03/06/2022   Procedure: MAZE;  Surgeon: HMelrose Nakayama MD;  [facility]  Service: Open Heart Surgery;  Laterality: N/A;   RADIOLOGY WITH ANESTHESIA N/A 11/12/2021   Procedure: IR WITH ANESTHESIA;  Surgeon: DLuanne Bras MD;  [place] [facility] [place]  Service: Radiology;  Laterality: N/A;   TEE WITHOUT CARDIOVERSION N/A 08/11/2020   Procedure: TRANSESOPHAGEAL ECHOCARDIOGRAM (TEE);  Surgeon: CConstance Haw MD;  [facility];  Service: Cardiovascular;  Laterality: N/A;   TEE WITHOUT CARDIOVERSION N/A 11/08/2021   Procedure: TRANSESOPHAGEAL ECHOCARDIOGRAM (TEE);  Surgeon: GMinna Merritts MD;  Location: [facility];  Service: Cardiovascular;  Laterality: N/A;   TEE WITHOUT CARDIOVERSION N/A 03/06/2022   Procedure: TRANSESOPHAGEAL ECHOCARDIOGRAM (TEE);  Surgeon: HMelrose Nakayama MD;  [place] [facility]  Service: Open Heart Surgery;  Laterality: N/A;  TUBAL LIGATION     x 2   UPPER GI ENDOSCOPY  08/17/2015   Procedure: UPPER GI ENDOSCOPY;  Surgeon: Johnathan Hausen, MD;  [facility];  Service: General;;    Social History:  reports that she quit smoking about 3 weeks ago. Her smoking use included cigarettes. She has a 7.75 pack-year smoking history. She has been exposed to tobacco smoke. She has never used smokeless tobacco. She reports current alcohol use of about 1.0 - 2.0 standard drink of alcohol per week. She reports that she does not use drugs.  Family History:  Family History  Problem Relation Age of Onset   Hypertension Mother    Diabetes Mother    Heart failure Mother      Prior to Admission medications   Medication Sig Start Date End Date Taking? Authorizing Provider  acetaminophen (TYLENOL) 500 MG tablet Take 1,000 mg by mouth every 6 (six) hours as needed (pain/headaches.).    [provider]  apixaban (ELIQUIS) 5 MG TABS tablet TAKE 1 TABLET(5 MG) BY MOUTH TWICE DAILY 10/19/22   Camnitz, Ocie Doyne, MD  b complex vitamins capsule  Take 1 capsule by mouth daily.    [provider]  Cholecalciferol (VITAMIN D3 PO) Take 1 tablet by mouth in the morning.    [provider]  cyclobenzaprine (FLEXERIL) 10 MG tablet Take 10 mg by mouth 2 (two) times daily as needed for muscle spasms. 1/2 - 1 po bid prn    [provider]  furosemide (LASIX) 40 MG tablet Take 1 tablet once daily for 7 days ,then stop 04/21/22   Camnitz, Ocie Doyne, MD  midodrine (PROAMATINE) 5 MG tablet Take 3 tablets (15 mg total) by mouth 2 (two) times daily with a meal. 10/19/22   Camnitz, Will Hassell Done, MD  pantoprazole (PROTONIX) 20 MG tablet Take 20 mg by mouth daily.    [provider]  potassium chloride SA (KLOR-CON M20) 20 MEQ tablet Take 1 tablet once daily, with Lasix, for 7 days 04/21/22   Camnitz, Ocie Doyne, MD  pravastatin (PRAVACHOL) 20 MG tablet Take 40 mg by mouth daily.    [provider]  rOPINIRole (REQUIP) 0.5 MG tablet Take 0.5 mg by mouth 3 (three) times daily.    [provider]    Physical Exam: Vitals:   10/29/22 1451 10/29/22 1454  BP:  126/87  Pulse:  (!) 101  Resp:  20  Temp:  98 F (36.7 C)  TempSrc:  Oral  SpO2:  98%  Weight: 64.5 kg   Height: 5' 4"$  (1.626 m)    General: Not in acute distress HEENT:       Eyes: PERRL, EOMI, no scleral icterus.       ENT: No discharge from the ears and nose, no pharynx injection, no tonsillar enlargement.        Neck: No JVD, no bruit, no mass felt. Heme: No neck lymph node enlargement. Cardiac: S1/S2, RRR, No gallops or rubs. Respiratory: No rales, wheezing, rhonchi or rubs. GI: Soft, nondistended, nontender, no rebound pain, no organomegaly, BS present. GU: No hematuria Ext: has trace leg edema bilaterally. 1+DP/PT pulse bilaterally. Musculoskeletal: No joint deformities, No joint redness or warmth, no limitation of ROM in spin. Skin: No rashes.  Neuro: Alert, oriented X3, cranial nerves II-XII grossly intact, moves all extremities  normally.   Psych: Patient is not psychotic, no suicidal or hemocidal ideation.  Labs on Admission: I have personally reviewed following labs and imaging  studies  CBC: Recent Labs  Lab 10/25/22 0831 10/29/22 1514  WBC 7.2 8.5  HGB 11.5 11.9*  HCT 36.4 36.6  MCV 91 89.7  PLT 365 [id number]   Basic Metabolic Panel: Recent Labs  Lab 10/25/22 0831 10/29/22 1514  NA 142 141  K 3.9 3.5  CL 106 107  CO2 21 23  GLUCOSE 85 90  BUN 14 19  CREATININE 0.80 0.92  CALCIUM 9.5 9.4   GFR: Estimated Creatinine Clearance: 62.5 mL/min (by C-G formula based on SCr of 0.92 mg/dL). Liver Function Tests: No results for input(s): "AST", "ALT", "ALKPHOS", "BILITOT", "PROT", "ALBUMIN" in the last 168 hours. No results for input(s): "LIPASE", "AMYLASE" in the last 168 hours. No results for input(s): "AMMONIA" in the last 168 hours. Coagulation Profile: No results for input(s): "INR", "PROTIME" in the last 168 hours. Cardiac Enzymes: No results for input(s): "CKTOTAL", "CKMB", "CKMBINDEX", "TROPONINI" in the last 168 hours. BNP (last 3 results) No results for input(s): "PROBNP" in the last 8760 hours. HbA1C: No results for input(s): "HGBA1C" in the last 72 hours. CBG: No results for input(s): "GLUCAP" in the last 168 hours. Lipid Profile: Recent Labs    10/29/22 1639  CHOL 153  HDL 71  LDLCALC 75  TRIG 35  CHOLHDL 2.2   Thyroid Function Tests: No results for input(s): "TSH", "T4TOTAL", "FREET4", "T3FREE", "THYROIDAB" in the last 72 hours. Anemia Panel: No results for input(s): "VITAMINB12", "FOLATE", "FERRITIN", "TIBC", "IRON", "RETICCTPCT" in the last 72 hours. Urine analysis:    Component Value Date/Time   COLORURINE AMBER (A) 03/02/2022 1150   APPEARANCEUR CLOUDY (A) 03/02/2022 1150   LABSPEC 1.029 03/02/2022 1150   PHURINE 5.0 03/02/2022 1150   GLUCOSEU NEGATIVE 03/02/2022 1150   HGBUR NEGATIVE 03/02/2022 1150   BILIRUBINUR NEGATIVE 03/02/2022 1150   KETONESUR 5 (A) 03/02/2022  1150   PROTEINUR 30 (A) 03/02/2022 1150   NITRITE NEGATIVE 03/02/2022 1150   LEUKOCYTESUR NEGATIVE 03/02/2022 1150   Sepsis Labs: @LABRCNTIP$ (procalcitonin:4,lacticidven:4) )No results found for this or any previous visit (from the past 240 hour(s)).   Radiological Exams on Admission: DG Chest 2 View  Result Date: 10/29/2022 CLINICAL DATA:  Chest tightness EXAM: CHEST - 2 VIEW COMPARISON:  A 1523 FINDINGS: No focal consolidation. No pleural effusion or pneumothorax. Heart and mediastinal contours are stable. Left atrial appendage clipping. Prior median sternotomy. No acute osseous abnormality. IMPRESSION: No active cardiopulmonary disease. Electronically Signed   By: Kathreen Devoid M.D.   On: 10/29/2022 15:35      Assessment/Plan Principal Problem:   NSTEMI (non-ST elevated myocardial infarction) ([place]) Active Problems:   Chronic diastolic CHF (congestive heart failure) (HCC)   Atrial fibrillation, chronic (HCC)   HLD (hyperlipidemia)   Depression   Stroke ([place])   Assessment and Plan:  NSTEMI (non-ST elevated myocardial infarction) ([place]): trop 41. Now her chest pain is minimal.  Consulted Dr. Rockey Situ for cardiology.  - place to tele bed for observation - switch Eliquis to IV heparin - Trend Trop - prn Nitroglycerin, Morphine - pravastatin - pt took 324 mg of ASA, will give 81 mg daily tomorrow - Risk factor stratification: will check FLP and A1C  - check UDS  Chronic diastolic CHF (congestive heart failure) ([place]): 2D echo on 11/13/2021 showed EF 60 to 65%.  Patient has trace leg edema, no JVD.  No shortness breath.  CHF is compensated. -Continue home Lasix 40 mg daily  Atrial fibrillation, chronic ([place]): S/p of Maze.  Heart rate 101. -Switched Eliquis to  IV heparin  HLD (hyperlipidemia) -Pravastatin  Depression -Requip  History of stroke ([place]): -Pravastatin -pt was on Eliquis at home       DVT ppx: on IV Heparin   Code Status: Full code  Family  Communication: I offered to call her family, but patient states that her fianc brought her to the hospital.  He knows what is going on for her. She said I do not need to call her family  Disposition Plan:  Anticipate discharge back to previous environment  Consults called:  Dr. Rockey Situ of card  Admission status and Level of care: Telemetry Cardiac:    for obs    Dispo: The patient is from: Home              Anticipated d/c is to: Home              Anticipated d/c date is: 1 day              Patient currently is not medically stable to d/c.    Severity of Illness:  The appropriate patient status for this patient is OBSERVATION. Observation status is judged to be reasonable and necessary in order to provide the required intensity of service to ensure the patient's safety. The patient's presenting symptoms, physical exam findings, and initial radiographic and laboratory data in the context of their medical condition is felt to place them at decreased risk for further clinical deterioration. Furthermore, it is anticipated that the patient will be medically stable for discharge from the hospital within 2 midnights of admission.        Date of Service 10/29/2022    Ivor Costa [facility]   If 7PM-7AM, please contact night-coverage [email or web address] 10/29/2022, 5:11 PM

## 2022-10-30 ENCOUNTER — Encounter
Admission: EM | Disposition: A | Discharge status: Home / Self Care | Payer: Self-pay | Source: Home / Self Care | Attending: Family Medicine

## 2022-10-30 ENCOUNTER — Observation Stay (HOSPITAL_COMMUNITY)
Admit: 2022-10-30 | Discharge: 2022-10-30 | Disposition: A | Discharge status: Home / Self Care | Payer: 59 | Attending: Cardiovascular Disease | Admitting: Cardiovascular Disease

## 2022-10-30 ENCOUNTER — Encounter: Payer: Self-pay | Admitting: Internal Medicine

## 2022-10-30 DIAGNOSIS — Z7901 Long term (current) use of anticoagulants: Secondary | ICD-10-CM | POA: Diagnosis not present

## 2022-10-30 DIAGNOSIS — I2489 Other forms of acute ischemic heart disease: Secondary | ICD-10-CM | POA: Diagnosis not present

## 2022-10-30 DIAGNOSIS — I214 Non-ST elevation (NSTEMI) myocardial infarction: Secondary | ICD-10-CM | POA: Diagnosis not present

## 2022-10-30 DIAGNOSIS — E785 Hyperlipidemia, unspecified: Secondary | ICD-10-CM | POA: Diagnosis not present

## 2022-10-30 DIAGNOSIS — R002 Palpitations: Secondary | ICD-10-CM

## 2022-10-30 DIAGNOSIS — I5032 Chronic diastolic (congestive) heart failure: Secondary | ICD-10-CM | POA: Diagnosis not present

## 2022-10-30 DIAGNOSIS — Z8249 Family history of ischemic heart disease and other diseases of the circulatory system: Secondary | ICD-10-CM | POA: Diagnosis not present

## 2022-10-30 DIAGNOSIS — I951 Orthostatic hypotension: Secondary | ICD-10-CM

## 2022-10-30 DIAGNOSIS — E876 Hypokalemia: Secondary | ICD-10-CM | POA: Diagnosis not present

## 2022-10-30 DIAGNOSIS — Z833 Family history of diabetes mellitus: Secondary | ICD-10-CM | POA: Diagnosis not present

## 2022-10-30 DIAGNOSIS — I4819 Other persistent atrial fibrillation: Secondary | ICD-10-CM | POA: Diagnosis not present

## 2022-10-30 DIAGNOSIS — I252 Old myocardial infarction: Secondary | ICD-10-CM | POA: Diagnosis not present

## 2022-10-30 DIAGNOSIS — Z9884 Bariatric surgery status: Secondary | ICD-10-CM | POA: Diagnosis not present

## 2022-10-30 DIAGNOSIS — Z87891 Personal history of nicotine dependence: Secondary | ICD-10-CM | POA: Diagnosis not present

## 2022-10-30 DIAGNOSIS — I2510 Atherosclerotic heart disease of native coronary artery without angina pectoris: Secondary | ICD-10-CM | POA: Diagnosis not present

## 2022-10-30 DIAGNOSIS — I110 Hypertensive heart disease with heart failure: Secondary | ICD-10-CM | POA: Diagnosis not present

## 2022-10-30 DIAGNOSIS — I493 Ventricular premature depolarization: Secondary | ICD-10-CM | POA: Diagnosis not present

## 2022-10-30 DIAGNOSIS — Z8673 Personal history of transient ischemic attack (TIA), and cerebral infarction without residual deficits: Secondary | ICD-10-CM | POA: Diagnosis not present

## 2022-10-30 DIAGNOSIS — Z885 Allergy status to narcotic agent status: Secondary | ICD-10-CM | POA: Diagnosis not present

## 2022-10-30 DIAGNOSIS — Z888 Allergy status to other drugs, medicaments and biological substances status: Secondary | ICD-10-CM | POA: Diagnosis not present

## 2022-10-30 DIAGNOSIS — E119 Type 2 diabetes mellitus without complications: Secondary | ICD-10-CM | POA: Diagnosis not present

## 2022-10-30 DIAGNOSIS — F32A Depression, unspecified: Secondary | ICD-10-CM | POA: Diagnosis not present

## 2022-10-30 DIAGNOSIS — Z90710 Acquired absence of both cervix and uterus: Secondary | ICD-10-CM | POA: Diagnosis not present

## 2022-10-30 DIAGNOSIS — Z79899 Other long term (current) drug therapy: Secondary | ICD-10-CM | POA: Diagnosis not present

## 2022-10-30 HISTORY — PX: LEFT HEART CATH AND CORONARY ANGIOGRAPHY: CATH118249

## 2022-10-30 LAB — ECHOCARDIOGRAM COMPLETE
AR max vel: 2.68 cm2
AV Area VTI: 3.36 cm2
AV Area mean vel: 2.72 cm2
AV Mean grad: 3 mmHg
AV Peak grad: 5.5 mmHg
Ao pk vel: 1.17 m/s
Area-P 1/2: 3.56 cm2
Height: 64 in
S' Lateral: 2.4 cm
Weight: 2200 oz

## 2022-10-30 LAB — URINE DRUG SCREEN, QUALITATIVE (ARMC ONLY)
Amphetamines, Ur Screen: NOT DETECTED
Barbiturates, Ur Screen: NOT DETECTED
Benzodiazepine, Ur Scrn: NOT DETECTED
Cannabinoid 50 Ng, Ur ~~LOC~~: POSITIVE — AB
Cocaine Metabolite,Ur ~~LOC~~: NOT DETECTED
MDMA (Ecstasy)Ur Screen: NOT DETECTED
Methadone Scn, Ur: NOT DETECTED
Opiate, Ur Screen: NOT DETECTED
Phencyclidine (PCP) Ur S: NOT DETECTED
Tricyclic, Ur Screen: POSITIVE — AB

## 2022-10-30 LAB — TROPONIN I (HIGH SENSITIVITY)
Troponin I (High Sensitivity): 37 ng/L — ABNORMAL HIGH (ref <18)
Troponin I (High Sensitivity): 37 ng/L — ABNORMAL HIGH (ref <18)

## 2022-10-30 LAB — BASIC METABOLIC PANEL
Anion gap: 8 (ref 5–15)
BUN: 20 mg/dL (ref 6–20)
CO2: 23 mmol/L (ref 22–32)
Calcium: 8.7 mg/dL — ABNORMAL LOW (ref 8.9–10.3)
Chloride: 108 mmol/L (ref 98–111)
Creatinine, Ser: 0.89 mg/dL (ref 0.44–1.00)
GFR, Estimated: >60 mL/min (ref >60)
Glucose, Bld: 85 mg/dL (ref 70–99)
Potassium: 3.2 mmol/L — ABNORMAL LOW (ref 3.5–5.1)
Sodium: 139 mmol/L (ref 135–145)

## 2022-10-30 LAB — PROTIME-INR
INR: 1.3 — ABNORMAL HIGH (ref 0.8–1.2)
Prothrombin Time: 15.8 seconds — ABNORMAL HIGH (ref 11.4–15.2)

## 2022-10-30 LAB — CBC
HCT: 35.7 % — ABNORMAL LOW (ref 36.0–46.0)
Hemoglobin: 11.3 g/dL — ABNORMAL LOW (ref 12.0–15.0)
MCH: 28.5 pg (ref 26.0–34.0)
MCHC: 31.7 g/dL (ref 30.0–36.0)
MCV: 90.2 fL (ref 80.0–100.0)
Platelets: 283 10*3/uL (ref 150–400)
RBC: 3.96 MIL/uL (ref 3.87–5.11)
RDW: 17.4 % — ABNORMAL HIGH (ref 11.5–15.5)
WBC: 8.1 10*3/uL (ref 4.0–10.5)
nRBC: 0 % (ref 0.0–0.2)

## 2022-10-30 LAB — APTT
aPTT: 124 seconds — ABNORMAL HIGH (ref 24–36)
aPTT: 63 seconds — ABNORMAL HIGH (ref 24–36)

## 2022-10-30 LAB — HEMOGLOBIN A1C
Hgb A1c MFr Bld: 6.1 % — ABNORMAL HIGH (ref 4.8–5.6)
Mean Plasma Glucose: 128.37 mg/dL

## 2022-10-30 LAB — PREGNANCY, URINE: Preg Test, Ur: NEGATIVE

## 2022-10-30 LAB — HEPARIN LEVEL (UNFRACTIONATED): Heparin Unfractionated: >1.1 IU/mL — ABNORMAL HIGH (ref 0.30–0.70)

## 2022-10-30 SURGERY — LEFT HEART CATH AND CORONARY ANGIOGRAPHY
Anesthesia: Moderate Sedation

## 2022-10-30 MED ORDER — MIDAZOLAM HCL 2 MG/2ML IJ SOLN
INTRAMUSCULAR | Status: DC | PRN
  Administered 2022-10-30: 1 mg via INTRAVENOUS

## 2022-10-30 MED ORDER — HEPARIN (PORCINE) IN NACL 1000-0.9 UT/500ML-% IV SOLN
INTRAVENOUS | Status: DC | PRN
  Administered 2022-10-30: 1000 mL

## 2022-10-30 MED ORDER — SODIUM CHLORIDE 0.9 % IV SOLN: 250.0000 mL | INTRAVENOUS | Status: DC | PRN

## 2022-10-30 MED ORDER — SODIUM CHLORIDE 0.9% FLUSH
3.0000 mL | Freq: Two times a day (BID) | INTRAVENOUS | Status: DC
  Administered 2022-10-30 – 2022-10-31 (×2): 3 mL via INTRAVENOUS

## 2022-10-30 MED ORDER — IOHEXOL 300 MG/ML  SOLN
INTRAMUSCULAR | Status: DC | PRN
  Administered 2022-10-30: 30 mL

## 2022-10-30 MED ORDER — SODIUM CHLORIDE 0.9% FLUSH: 3.0000 mL | INTRAVENOUS | Status: DC | PRN

## 2022-10-30 MED ORDER — FENTANYL CITRATE (PF) 100 MCG/2ML IJ SOLN
INTRAMUSCULAR | Status: DC | PRN
  Administered 2022-10-30: 25 ug via INTRAVENOUS

## 2022-10-30 MED ORDER — VERAPAMIL HCL 2.5 MG/ML IV SOLN
INTRAVENOUS | Status: DC | PRN
  Administered 2022-10-30: 2.5 mg via INTRA_ARTERIAL

## 2022-10-30 MED ORDER — MIDAZOLAM HCL 2 MG/2ML IJ SOLN
INTRAMUSCULAR | Status: AC
  Filled 2022-10-30: qty 2

## 2022-10-30 MED ORDER — SODIUM CHLORIDE 0.9% FLUSH: 3.0000 mL | Freq: Two times a day (BID) | INTRAVENOUS | Status: DC

## 2022-10-30 MED ORDER — ASPIRIN 81 MG PO CHEW: 81.0000 mg | CHEWABLE_TABLET | ORAL | Status: DC

## 2022-10-30 MED ORDER — HEPARIN BOLUS VIA INFUSION
900.0000 [IU] | Freq: Once | INTRAVENOUS | Status: AC
  Administered 2022-10-30: 900 [IU] via INTRAVENOUS
  Filled 2022-10-30: qty 900

## 2022-10-30 MED ORDER — SODIUM CHLORIDE 0.9 % IV SOLN: INTRAVENOUS | Status: DC

## 2022-10-30 MED ORDER — VERAPAMIL HCL 2.5 MG/ML IV SOLN
INTRAVENOUS | Status: AC
  Filled 2022-10-30: qty 2

## 2022-10-30 MED ORDER — HEPARIN SODIUM (PORCINE) 1000 UNIT/ML IJ SOLN
INTRAMUSCULAR | Status: AC
  Filled 2022-10-30: qty 10

## 2022-10-30 MED ORDER — FENTANYL CITRATE (PF) 100 MCG/2ML IJ SOLN
INTRAMUSCULAR | Status: AC
  Filled 2022-10-30: qty 2

## 2022-10-30 MED ORDER — HEPARIN (PORCINE) 25000 UT/250ML-% IV SOLN
900.0000 [IU]/h | INTRAVENOUS | Status: DC
  Administered 2022-10-30: 800 [IU]/h via INTRAVENOUS
  Filled 2022-10-30: qty 250

## 2022-10-30 MED ORDER — HEPARIN (PORCINE) IN NACL 1000-0.9 UT/500ML-% IV SOLN
INTRAVENOUS | Status: AC
  Filled 2022-10-30: qty 1000

## 2022-10-30 MED ORDER — POTASSIUM CHLORIDE 20 MEQ PO PACK
40.0000 meq | PACK | Freq: Once | ORAL | Status: AC
  Administered 2022-10-30: 40 meq via ORAL
  Filled 2022-10-30: qty 2

## 2022-10-30 MED ORDER — HEPARIN SODIUM (PORCINE) 1000 UNIT/ML IJ SOLN
INTRAMUSCULAR | Status: DC | PRN
  Administered 2022-10-30: 3000 [IU] via INTRAVENOUS

## 2022-10-30 MED ORDER — SODIUM CHLORIDE 0.9 % IV SOLN: INTRAVENOUS | Status: AC

## 2022-10-30 SURGICAL SUPPLY — 9 items
CATH INFINITI 5FR JK (CATHETERS) IMPLANT
DRAPE BRACHIAL (DRAPES) IMPLANT
GLIDESHEATH SLEND SS 6F .021 (SHEATH) IMPLANT
GUIDEWIRE INQWIRE 1.5J.035X260 (WIRE) IMPLANT
INQWIRE 1.5J .035X260CM (WIRE) ×1
PACK CARDIAC CATH (CUSTOM PROCEDURE TRAY) ×1 IMPLANT
PROTECTION STATION PRESSURIZED (MISCELLANEOUS) ×1
SET ATX SIMPLICITY (MISCELLANEOUS) IMPLANT
STATION PROTECTION PRESSURIZED (MISCELLANEOUS) IMPLANT

## 2022-10-30 NOTE — Consult Note (Signed)
ANTICOAGULATION CONSULT NOTE  Pharmacy Consult for heparin infusion Indication: chest pain/ACS  Allergies  Allergen Reactions   Atorvastatin Nausea Only   Codeine Hives        Hydrocodone Hives    Patient Measurements: Height: 5' 4"$  (162.6 cm) Weight: 62.4 kg (137 lb 8 oz) IBW/kg (Calculated) : 54.7 Heparin Dosing Weight: 64.5 kg  Vital Signs: Temp: 98.2 F (36.8 C) (02/12 1458) Temp Source: Oral (02/12 1101) BP: 114/83 (02/12 1715) Pulse Rate: 99 (02/12 1715)  Labs: Recent Labs    10/29/22 1514 10/29/22 1639 10/29/22 1918 10/30/22 0010 10/30/22 0421 10/30/22 0746  HGB 11.9*  --   --   --  11.3*  --   HCT 36.6  --   --   --  35.7*  --   PLT 340  --   --   --  283  --   APTT  --   --   --  124*  --  63*  LABPROT  --   --   --  15.8*  --   --   INR  --   --   --  1.3*  --   --   HEPARINUNFRC  --   --   --   --   --  >1.10*  CREATININE 0.92  --   --   --  0.89  --   TROPONINIHS 41*   < > 44* 37* 37*  --    < > = values in this interval not displayed.     Estimated Creatinine Clearance: 64.6 mL/min (by C-G formula based on SCr of 0.89 mg/dL).   Medical History: Past Medical History:  Diagnosis Date   (HFpEF) heart failure with preserved ejection fraction ([place])    a. 07/2019 Echo: EF 60-65%, mod LVH. Sev dil LA, mildly dil RA. Mod elev PASP; b. 07/2020 TEE: EF 50-55%, no rwma, sev conc LVH. Nl RV fxn. Mod dil RA. Mild-mod MR, mod TR.   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 07/2020 Cardiac CT: Cor Ca2+ = 338 (99th %'ile).   Complication of anesthesia 20 years ago   a. spinal with first c section went too high stopped breathing, low bp with 2nd c section, no further issues with anesthesia   Heart murmur    a. 07/2019 Echo: no significant valvular dzs; b. 07/2020 TEE: Mild-mod MR, mod TR.   History of hiatal hernia    Hypertrophic cardiomyopathy ([place])    a. 07/2019 Echo: Mod LVH; b. 01/2020 cMRI: EF 67%, HCM w/o obstruction. Max wall  thickness 41m. Sev LAE w/ L->R atrial level shunt. Large area of LGE @ mid-ventricular level in area of max wall thickness; c. 07/2020 Cardiac CT: Asymm hypertrophy up to 174min mid inferoseptum consistent w/ known HCM.   Morbid obesity ([place]   a. 09/2014 s/p gastric bypass.   Persistent atrial fibrillation ([place]   a. Dx 07/2019-->CHA2DS2VASc = 2 (diast CHF/Fem)-->Eliquis; b. 09/2018 s/p DCCV (150J (biphasic) x 1); c. 12/2019 RFCA/PVI; d. 07/2020 repeat RFCA/PVI.   Rash    both arms from old bed bug bites, healing   [facility]   Tobacco abuse    Typical atrial flutter ([facility]CS[place]   a. 12/2019 s/p RFCA.    Medications:  PTA: Eliquis 72m46mID (last dose 2/11 at 0700)  Inpatient: Heparin infusion (2/11 >>) Allergies: NO AC/APT related allergies  Assessment: [age]ar old female with history of  atrial fibirillation on eliquis, presents to ED with complaints of chest pain. Pharmacy consulted for management of heparin infusion in the setting of suspected NSTEMI.   Date Time aPTT/HL Rate/Comment 2/12 0010 aPTT 124 Supratherapeutic at 900 un/hr 2/12` 0746 aPTT 63 Subtherapeutic at 750un/hr  Goal of Therapy:  Heparin level 0.3-0.7 units/ml aPTT 66-102 seconds Monitor platelets by anticoagulation protocol: Yes   Plan:  --Consulted to resume heparin drip 2 hours after radial band removal. --Resume heparin drip at 800 units/hr, scheduled for 10/30/22 @ 1930 --Check aPTT/Anti-Xa level 6 hours after resumption and daily once consecutively therapeutic.  --Titrate by aPTT's until lab correlation is noted, then titrate by anti-xa alone. --Continue to monitor H&H and platelets daily while on heparin gtt.      Tollie Eth III, PharmD 10/30/2022 5:40 PM

## 2022-10-30 NOTE — Consult Note (Signed)
ELECTROPHYSIOLOGY CONSULT NOTE  Patient ID: Hannah Zimmerman, MRN: [id number], DOB/AGE: May 05, 1971 52 y.o. Admit date: 10/29/2022 Date of Consult: 10/30/2022  Primary Physician: Donnamarie Rossetti, PA-C Primary Cardiologist: [facility] Hannah Zimmerman is a 52 y.o. female who is being seen today for the evaluation of bradycardia and tachycardia  at the request of Dr MA.   Chief Complaint: palpitations   HPI Hannah Zimmerman is a 52 y.o. 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  This creates a great deal of anxiety, although she does not have symptoms with this apart from the awareness of it.  She dates the problem of her sinus node and palpitations to her cardiothoracic surgery a year ago which was notably complicated by sinus node dysfunction in the context of amiodarone which was discontinued  Separately from this and unassociated with palpitations she has recurrent near syncope.  She has a history of stroke post cardioversion.  In the wake of this, she had hypotension and was put on ProAmatine and has been managed with t high-dose ProAmatine for the last couple of years.  In this regard it she has orthostatic lightheadedness, shower intolerance, in the context of a prior gastric bypass     DATE TEST EF   2/21 cMRI  HCM LGE 15%  2/23 Echo  60-65% LVH severe 15/15 LAE severe  56 cm2  2//24 Echo   65-70 % ASH (11m)  2/24 LHC  Non obstructive CAD        Date Cr K Hgb  2/24 0.93 3.2 12.0          Past Medical History:  Diagnosis Date   (HFpEF) heart failure with preserved ejection fraction ([place]    a. 07/2019 Echo: EF 60-65%, mod LVH. Sev dil LA, mildly dil RA. Mod elev PASP; b. 07/2020 TEE: EF 50-55%, no rwma, sev conc LVH. Nl RV fxn.  Mod dil RA. Mild-mod MR, mod TR.   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 07/2020 Cardiac CT: Cor Ca2+ = 338 (99th %'ile).   Complication of anesthesia 20 years ago   a. spinal with first c section went too high stopped breathing, low bp with 2nd c section, no further issues with anesthesia   Heart murmur    a. 07/2019 Echo: no significant valvular dzs; b. 07/2020 TEE: Mild-mod MR, mod TR.   History of hiatal hernia    Hypertrophic cardiomyopathy ([place]    a. 07/2019 Echo: Mod LVH; b. 01/2020 cMRI: EF 67%, HCM w/o obstruction. Max wall thickness 162m Sev LAE w/ L->R atrial level shunt. Large area of LGE @ mid-ventricular level in area of max wall thickness; c. 07/2020 Cardiac CT: Asymm hypertrophy up to 1936mn mid inferoseptum consistent w/ known HCM.   Morbid [place] ([place]  a. 09/2014 s/p gastric bypass.   Persistent atrial fibrillation ([place]  a. Dx 07/2019-->CHA2DS2VASc = 2 (diast CHF/Fem)-->Eliquis; b. 09/2018 s/p DCCV (150J (biphasic) x 1); c. 12/2019 RFCA/PVI; d. 07/2020 repeat RFCA/PVI.   Rash    both arms from old bed bug bites, healing   [facility]  Tobacco abuse    Typical atrial flutter ([place]CC[place]  a. 12/2019 s/p  RFCA.       Surgical History:  Past Surgical History:  Procedure Laterality Date   ABDOMINAL HYSTERECTOMY     ANKLE SURGERY     left x 2   ATRIAL FIBRILLATION ABLATION N/A 12/24/2019   Procedure: ATRIAL FIBRILLATION ABLATION;  Surgeon: Constance Haw, MD;  Location: [place] [facility];  Service: Cardiovascular;  Laterality: N/A;   ATRIAL FIBRILLATION ABLATION N/A 08/11/2020   Procedure: ATRIAL FIBRILLATION ABLATION;  Surgeon: Constance Haw, MD;  Location: [place] [facility];  Service: Cardiovascular;  Laterality: N/A;   CARDIOVERSION N/A 09/24/2019   Procedure: CARDIOVERSION;  Surgeon: Minna Merritts, MD;  Location: [place] [facility];  Service: Cardiovascular;  Laterality: N/A;   CARDIOVERSION N/A 10/17/2019   Procedure:  CARDIOVERSION;  Surgeon: Minna Merritts, MD;  Location: [facility];  Service: Cardiovascular;  Laterality: N/A;   CARDIOVERSION N/A 11/08/2021   Procedure: CARDIOVERSION;  Surgeon: Minna Merritts, MD;  Location: [facility];  Service: Cardiovascular;  Laterality: N/A;   CESAREAN SECTION     x 3   CLIPPING OF ATRIAL APPENDAGE N/A 03/06/2022   Procedure: CLIPPING OF ATRIAL APPENDAGE USING ATRICURE  CLIP SIZE 45MM;  Surgeon: Melrose Nakayama, MD;  Location: [place];  Service: Open Heart Surgery;  Laterality: N/A;   EXCISION OF ATRIAL MYXOMA N/A 03/06/2022   Procedure: RESECTION OF AORTIC VALVE TUMOR;  Surgeon: Melrose Nakayama, MD;  Location: [place];  Service: Open Heart Surgery;  Laterality: N/A;   HERNIA REPAIR     x2 one was for sure a hiatal hernia   IR ANGIO EXTRACRAN SEL COM CAROTID INNOMINATE UNI L MOD SED  11/13/2021   IR CT HEAD LTD  11/13/2021   IR PERCUTANEOUS ART THROMBECTOMY/INFUSION INTRACRANIAL INC DIAG ANGIO  11/13/2021   IR THORACENTESIS ASP PLEURAL SPACE W/IMG GUIDE  03/14/2022   LAPAROSCOPIC GASTRIC SLEEVE RESECTION  2020   LAPAROSCOPIC GASTRIC SLEEVE RESECTION WITH HIATAL HERNIA REPAIR  08/17/2015   Procedure: LAPAROSCOPIC GASTRIC SLEEVE RESECTION WITH HIATAL HERNIA REPAIR;  Surgeon: Johnathan Hausen, MD;  [facility];  Service: General;;   MAZE N/A 03/06/2022   Procedure: MAZE;  Surgeon: Melrose Nakayama, MD;  Location: [place];  Service: Open Heart Surgery;  Laterality: N/A;   RADIOLOGY WITH ANESTHESIA N/A 11/12/2021   Procedure: IR WITH ANESTHESIA;  Surgeon: Luanne Bras, MD;  Location: [place];  Service: Radiology;  Laterality: N/A;   TEE WITHOUT CARDIOVERSION N/A 08/11/2020   Procedure: TRANSESOPHAGEAL ECHOCARDIOGRAM (TEE);  Surgeon: Constance Haw, MD;  Location: [place] [facility];  Service: Cardiovascular;  Laterality: N/A;   TEE WITHOUT CARDIOVERSION N/A 11/08/2021   Procedure: TRANSESOPHAGEAL ECHOCARDIOGRAM (TEE);  Surgeon: Minna Merritts, MD;  Location: [facility];  Service: Cardiovascular;  Laterality: N/A;   TEE WITHOUT CARDIOVERSION N/A 03/06/2022   Procedure: TRANSESOPHAGEAL ECHOCARDIOGRAM (TEE);  Surgeon: Melrose Nakayama, MD;  Location: [place];  Service: Open Heart Surgery;  Laterality: N/A;   TUBAL LIGATION     x 2   UPPER GI ENDOSCOPY  08/17/2015   Procedure: UPPER GI ENDOSCOPY;  Surgeon: Johnathan Hausen, MD;  [facility];  Service: General;;     Home Meds: Prior to Admission medications   Medication Sig Start Date End Date Taking? Authorizing Provider  acetaminophen (TYLENOL) 500 MG tablet Take 1,000 mg by mouth every 6 (six) hours as needed (pain/headaches.).   Yes [provider]  apixaban (ELIQUIS) 5 MG TABS tablet TAKE 1 TABLET(5 MG) BY MOUTH  TWICE DAILY 10/19/22  Yes Camnitz, Ocie Doyne, MD  b complex vitamins capsule Take 1 capsule by mouth daily.   Yes [provider]  Cholecalciferol (VITAMIN D3 PO) Take 1 tablet by mouth in the morning.   Yes [provider]  cyclobenzaprine (FLEXERIL) 10 MG tablet Take 10 mg by mouth 2 (two) times daily as needed for muscle spasms. 1/2 - 1 po bid prn   Yes [provider]  furosemide (LASIX) 40 MG tablet Take 1 tablet once daily for 7 days ,then stop 04/21/22  Yes Camnitz, Will Hassell Done, MD  midodrine (PROAMATINE) 5 MG tablet Take 3 tablets (15 mg total) by mouth 2 (two) times daily with a meal. 10/19/22  Yes Camnitz, Will Hassell Done, MD  pantoprazole (PROTONIX) 20 MG tablet Take 20 mg by mouth daily.   Yes [provider]  potassium chloride SA (KLOR-CON M20) 20 MEQ tablet Take 1 tablet once daily, with Lasix, for 7 days 04/21/22  Yes Camnitz, Will Hassell Done, MD  pravastatin (PRAVACHOL) 20 MG tablet Take 20 mg by mouth daily.   Yes [provider]  rOPINIRole (REQUIP) 0.5 MG tablet Take 0.5 mg by mouth 3 (three) times daily.   Yes [provider]    Inpatient Medications:   [MAR Hold] aspirin EC  81 mg Oral Daily    [MAR Hold] cholecalciferol  1,000 Units Oral Daily   [MAR Hold] furosemide  40 mg Oral Daily   [MAR Hold] midodrine  15 mg Oral BID WC   [MAR Hold] pantoprazole  20 mg Oral Daily   [MAR Hold] pravastatin  20 mg Oral Daily   [MAR Hold] rOPINIRole  0.5 mg Oral TID   sodium chloride flush  3 mL Intravenous Q12H   [MAR Hold] vitamin B-12  100 mcg Oral Daily      Allergies:  Allergies  Allergen Reactions   Atorvastatin Nausea Only   Codeine Hives        Hydrocodone Hives    Social History   Socioeconomic History   Marital status: Significant Other    Spouse name: Not on file   Number of children: 3   Years of education: two years of college   Highest education level: Not on file  Occupational History   Occupation: office assistant  Tobacco Use   Smoking status: Former    Packs/day: 0.25    Years: 31.00    Total pack years: 7.75    Types: Cigarettes    Quit date: 10/03/2022    Years since quitting: 0.0    Passive exposure: Current   Smokeless tobacco: Never  Vaping Use   Vaping Use: Never used  Substance and Sexual Activity   Alcohol use: Yes    Alcohol/week: 1.0 - 2.0 standard drink of alcohol    Types: 1 - 2 Standard drinks or equivalent per week    Comment: Occaional use - 1-2 mixed drinks prior to stroke since d/c nothing 11/30/21   Drug use: No   Sexual activity: Yes  Other Topics Concern   Not on file  Social History Narrative   Lives at home with significant other.   Some caffeine daily I her green tea.   Right-handed.   Social Determinants of Health   Financial Resource Strain: Low Risk  (08/22/2019)   Overall Financial Resource Strain (CARDIA)    Difficulty of Paying Living Expenses: Not hard at all  Food Insecurity: No Food Insecurity (10/30/2022)   Hunger Vital Sign    Worried  About Running Out of Food in the Last Year: Never true    Ran Out of Food in the Last Year: Never true  Transportation Needs: No Transportation Needs (10/30/2022)   PRAPARE -  Hydrologist (Medical): No    Lack of Transportation (Non-Medical): No  Physical Activity: Sufficiently Active (08/22/2019)   Exercise Vital Sign    Days of Exercise per Week: 7 days    Minutes of Exercise per Session: 150+ min  Stress: No Stress Concern Present (08/22/2019)   [place]    Feeling of Stress : Only a little  Social Connections: Moderately Isolated (08/22/2019)   Social Connection and Isolation Panel [NHANES]    Frequency of Communication with Friends and Family: More than three times a week    Frequency of Social Gatherings with Friends and Family: More than three times a week    Attends Religious Services: 1 to 4 times per year    Active Member of [employer] or Organizations: No    Attends Archivist Meetings: Never    Marital Status: Separated  Intimate Partner Violence: Unknown (10/30/2022)   Humiliation, Afraid, Rape, and Kick questionnaire    Fear of Current or Ex-Partner: No    Emotionally Abused: Not on file    Physically Abused: No    Sexually Abused: No     Family History  Problem Relation Age of Onset   Hypertension Mother    Diabetes Mother    Heart failure Mother      ROS:  Please see the history of present illness.     All other systems reviewed and negative.    Physical Exam: Blood pressure 111/72, pulse 88, temperature 98.2 F (36.8 C), resp. rate 13, height 5' 4"$  (1.626 m), weight 62.4 kg, SpO2 96 %. General: Well developed, well nourished female in no acute distress. Head: Normocephalic, atraumatic, sclera non-icteric, no xanthomas, nares are without discharge. EENT: normal Lymph Nodes:  none Back: without scoliosis/kyphosis, no CVA tendersness Neck: Negative for carotid bruits. JVD not elevated. Lungs: Clear bilaterally to auscultation without wheezes, rales, or rhonchi. Breathing is unlabored. Heart: RRR with S1 S2. No murmur , rubs, or  gallops appreciated. Abdomen: Soft, non-tender, non-distended with normoactive bowel sounds. No hepatomegaly. No rebound/guarding. No obvious abdominal masses. Msk:  Strength and tone appear normal for age. Extremities: No clubbing or cyanosis. No  edema.  Distal pedal pulses are 2+ and equal bilaterally. Skin: Warm and Dry Neuro: Alert and oriented X 3. CN III-XII intact Grossly normal sensory and motor function . Psych:  Responds to questions appropriately with a normal affect.      Labs: Cardiac Enzymes No results for input(s): "CKTOTAL", "CKMB", "TROPONINI" in the last 72 hours. CBC Lab Results  Component Value Date   WBC 8.1 10/30/2022   HGB 11.3 (L) 10/30/2022   HCT 35.7 (L) 10/30/2022   MCV 90.2 10/30/2022   PLT 283 10/30/2022   PROTIME: Recent Labs    10/30/22 0010  LABPROT 15.8*  INR 1.3*   Chemistry  Recent Labs  Lab 10/30/22 0421  NA 139  K 3.2*  CL 108  CO2 23  BUN 20  CREATININE 0.89  CALCIUM 8.7*  GLUCOSE 85   Lipids Lab Results  Component Value Date   CHOL 153 10/29/2022   HDL 71 10/29/2022   LDLCALC 75 10/29/2022   TRIG 35 10/29/2022   BNP No results found for: "PROBNP" Thyroid  Function Tests: No results for input(s): "TSH", "T4TOTAL", "T3FREE", "THYROIDAB" in the last 72 hours.  Invalid input(s): "FREET3"         Telemetry  reviewed.  Bradycardia with sinus pauses, some atrial tachycardia; no significant arrhythmias VT nonsustained EKG: Ectopic atrial rhythm at 89 (variable P wave morphology) with ST changes in the inferolateral leads which were present also 8/23   Assessment and Plan:  Hypertrophic cardiomyopathy  VT nonsustained//LGE greater than 15%  Atrial fibrillation status post ablation x 2 and a maze operation  Sinus node dysfunction  Atrial tachycardia  Presyncope  Hypotension and orthostatic intolerance  Gastric bypass   There are number of issues.  #1-her palpitations which are largely bothersome but not  otherwise symptomatic.  Would maintain her electrolytes imbalance, including sustaining her potassium but would also put her on mag oxide 400 twice daily.  We discussed the possibility of treating her tachycardia but acknowledged that if we did this the bradycardia would get worse and she would likely need pacing.  Given her young age would like to avoid this but we may not be able to.  In terms of her trying to not be overwhelmed emotionally by her symptoms, biofeedback may be of some benefit.  2-the presyncopal episodes are not associated with palpitations, she has been already scheduled for outpatient event recorder.  These episodes happen relatively frequently.  They have happened while in house, but no notable arrhythmias were apparent.  They are relatively positional independent which would make blood pressure not likely, but we will have to see  3-her orthostatic hypotension and low blood pressure is currently being managed by midodrine.  People who had gastric bypass are sometimes afflicted with dysautonomia.  I will check a cortisol level to make sure she does not have an endocrine reason for her to hypotension.  Compression might be of some value as might be salt and water and perhaps the ProAmatine could be decreased.  4-for hypertrophic cardiomyopathy has 2 risk factors at least for consideration of an ICD for primary prevention, specifically nonsustained ventricular tachycardia and the LGE to greater than 15%.  Will defer this discussion to her primary electrophysiologist Dr. Curt Bears, but my inclination, especially in light of the bradycardia, would be in favor of proceeding with a primary prevention device if the patient were so inclined.  Moreover, related to this, she needs referral for genetic testing.  She has 3 siblings and 2 children who have not yet been evaluated.  If I can be of further assistance please do not hesitate to call       Virl Axe

## 2022-10-30 NOTE — Consult Note (Signed)
ANTICOAGULATION CONSULT NOTE  Pharmacy Consult for heparin infusion Indication: chest pain/ACS  Allergies  Allergen Reactions   Atorvastatin Nausea Only   Codeine Hives        Hydrocodone Hives    Patient Measurements: Height: 5' 4"$  (162.6 cm) Weight: 64.5 kg (142 lb 3.2 oz) IBW/kg (Calculated) : 54.7 Heparin Dosing Weight: 64.5 kg  Vital Signs: Temp: 97.9 F (36.6 C) (02/11 2008) Temp Source: Oral (02/11 1454) BP: 108/82 (02/11 2200) Pulse Rate: 92 (02/11 2200)  Labs: Recent Labs    10/29/22 1514 10/29/22 1639 10/29/22 1918 10/30/22 0010  HGB 11.9*  --   --   --   HCT 36.6  --   --   --   PLT 340  --   --   --   APTT  --   --   --  124*  CREATININE 0.92  --   --   --   TROPONINIHS 41* 40* 44*  --      Estimated Creatinine Clearance: 62.5 mL/min (by C-G formula based on SCr of 0.92 mg/dL).   Medical History: Past Medical History:  Diagnosis Date   (HFpEF) heart failure with preserved ejection fraction ([place])    a. 07/2019 Echo: EF 60-65%, mod LVH. Sev dil LA, mildly dil RA. Mod elev PASP; b. 07/2020 TEE: EF 50-55%, no rwma, sev conc LVH. Nl RV fxn. Mod dil RA. Mild-mod MR, mod TR.   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 07/2020 Cardiac CT: Cor Ca2+ = 338 (99th %'ile).   Complication of anesthesia 20 years ago   a. spinal with first c section went too high stopped breathing, low bp with 2nd c section, no further issues with anesthesia   Heart murmur    a. 07/2019 Echo: no significant valvular dzs; b. 07/2020 TEE: Mild-mod MR, mod TR.   History of hiatal hernia    Hypertrophic cardiomyopathy ([place])    a. 07/2019 Echo: Mod LVH; b. 01/2020 cMRI: EF 67%, HCM w/o obstruction. Max wall thickness 57m. Sev LAE w/ L->R atrial level shunt. Large area of LGE @ mid-ventricular level in area of max wall thickness; c. 07/2020 Cardiac CT: Asymm hypertrophy up to 182min mid inferoseptum consistent w/ known HCM.   Morbid [place] ([place]   a. 09/2014  s/p gastric bypass.   Persistent atrial fibrillation ([place]   a. Dx 07/2019-->CHA2DS2VASc = 2 (diast CHF/Fem)-->Eliquis; b. 09/2018 s/p DCCV (150J (biphasic) x 1); c. 12/2019 RFCA/PVI; d. 07/2020 repeat RFCA/PVI.   Rash    both arms from old bed bug bites, healing   [facility]   Tobacco abuse    Typical atrial flutter ([facility]   a. 12/2019 s/p RFCA.    Medications:  PTA: Eliquis 39m539mID (last dose 2/11 at 0700)  Inpatient: Heparin infusion (2/11 >>) Allergies: NO AC/APT related allergies  Assessment: [age]ar old female with history of atrial fibirillation on eliquis, presents to ED with complaints of chest pain. Pharmacy consulted for management of heparin infusion in the setting of suspected NSTEMI.   Date Time aPTT/HL Rate/Comment 2/12 0010 aPTT 124 supratherapeutic  Goal of Therapy:  Heparin level 0.3-0.7 units/ml aPTT 66-102 seconds Monitor platelets by anticoagulation protocol: Yes   Plan:  Decrease heparin infusion to 750 units/hr Check aPTT/Anti-Xa level in 6 hours after rate change and daily once consecutively therapeutic.  Titrate by aPTT's until lab correlation is noted, then titrate by anti-xa alone. Continue to  monitor H&H and platelets daily while on heparin gtt.      Renda Rolls, PharmD, [facility] 10/30/2022 12:47 AM

## 2022-10-30 NOTE — Consult Note (Signed)
Cardiology Consultation   Patient ID: Hannah Zimmerman MRN: [id number]; DOB: 22-Dec-1970  Admit date: 10/29/2022 Date of Consult: 10/30/2022  PCP:  Donnamarie Rossetti, [place] Prov[facility] Cardiologist:  [name], MD  Electrophysiologist:  Will Meredith Leeds, MD       Patient Profile:   Hannah Zimmerman is a 52 y.o. female with a hx of persistent atrial fibrillation status post catheter ablation, atypical atrial flutter status post catheter ablation, chronic HFpEF, hypertrophic cardiomyopathy, coronary calcifications, obesity status post gastric bypass, CVA,  and tobacco abuse who is who is being seen 10/30/2022 for the evaluation of NSTEMI at the request of Dr. Blaine Hamper.  History of Present Illness:   Hannah Zimmerman is a 52 year old female with previously mentioned above complex past medical history including persistent atrial fibrillation, typical atrial flutter followed by EP, chronic HFpEF, hypertrophic cardiomyopathy, coronary calcification, obesity status post gastric bypass and tobacco abuse.  In November 2020 she presented to [facility] with a 2-week history of increasing leg swelling and 15 pound weight gain.  At that time she was found to be in atrial fibrillation.  Echocardiogram revealed LVEF with moderate LVH and severely dilated left atrium.  She was diuresed and subsequently underwent successful cardioversion in early January 2021.  She had ER AF that was placed on flecainide 50 mg twice daily underwent repeat cardioversion in late January 2021 which was only successful for approximately 12 hours.  She was then evaluated by EP in April 2021 and underwent catheter ablation for atrial fibrillation and atrial flutter.  She was maintained on flecainide and apixaban postprocedure but later in the year developed left-sided atrial flutter and subsequently underwent repeat catheter ablation in November 2021.  In 01/27/2022 she underwent coronary CTA which revealed minimal  mixed nonobstructive CAD with a CAD RADS 1 (findings limited due to lack of nitroglycerin administration), coronary calcium score of 393 which is 99th percentile for age and sex matched control, normal coronary origin with right dominance, aortic valve but a 2 x 3 mm mass noted at the tip of the left coronary cusp primarily on the ventricular side of the leaflet most likely representing a fibroelastoma, significantly dilated main pulmonary artery suggestive of pulmonary hypertension, severe proximal ventricular septal thickening with a diagnosis of HCM, and 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  Unfortunately in February 2023 flecainide was stopped post stroke she was in sinus rhythm the stroke was suspected to be cryptogenic, QT was felt to be too long fatigue is and at that time so amiodarone was started.  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 Resection of aortic valve tumor, maze, clipping of atrial appendage and TEE were all successfully completed 03/03/2022.  She was last seen in clinic 10/25/2022 and seen by A.Tillery, PA for routine EP follow-up.  During her visit she reported worse in regards to her symptoms.  She complained of lightheadedness and dizziness every day multiple times during the day.  Stated she was in A-fib at her PCP visit yesterday and the rate of 110s.  She has tachypalpitations, fatigue and DOE.  She has been taking Lasix for 5 pound weight gain in the past week as well.  EKG during that visit showed normal sinus rhythm with a rate of 80s and she was placed on a 7-day ZIO and encouraged to continue her Lasix 20 mg as needed.  She presented to the [facility] emergency department on  10/29/2022 with complaints of chest pain.  It was abrupt onset and pain below the left breast followed by a pressure sensation across her upper chest and down her left arm.  She took an additional 324 mg of aspirin and remained on  Eliquis.  Upon arrival to the emergency department her pain was a mild pressure.She had nitropaste placed at the time which helped with her pain. Later in the night she had recurrent chest pain and was given the choice of Tylenol of Fentanyl and was given Tylenol. She stated she did not like the side effects of the Fentanyl.  She noted no aggravating or alleviating factors.  Denied any shortness of breath, fever, chills, nausea, vomiting, lightheadedness or dizziness. Of note last dose of Eliquis was 0730 10/29/2022.  Initial vital signs: Blood pressure 126/87, pulse of 101, respirations of 20, temperature of 98  Pertinent labs: Hemoglobin 11.9, troponin 41, 40 44, and 37, urine drug screen positive for tricyclic's and cannabinoid, BNP 439.5  Imaging: Chest x-ray revealed no active cardiopulmonary disease  Medications administered in the emergency department: 650 mg of Tylenol, Nitropaste, heparin drip  Past Medical History:  Diagnosis Date   (HFpEF) heart failure with preserved ejection fraction ([place])    a. 07/2019 Echo: EF 60-65%, mod LVH. Sev dil LA, mildly dil RA. Mod elev PASP; b. 07/2020 TEE: EF 50-55%, no rwma, sev conc LVH. Nl RV fxn. Mod dil RA. Mild-mod MR, mod TR.   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 07/2020 Cardiac CT: Cor Ca2+ = 338 (99th %'ile).   Complication of anesthesia 20 years ago   a. spinal with first c section went too high stopped breathing, low bp with 2nd c section, no further issues with anesthesia   Heart murmur    a. 07/2019 Echo: no significant valvular dzs; b. 07/2020 TEE: Mild-mod MR, mod TR.   History of hiatal hernia    Hypertrophic cardiomyopathy ([place])    a. 07/2019 Echo: Mod LVH; b. 01/2020 cMRI: EF 67%, HCM w/o obstruction. Max wall thickness 74m. Sev LAE w/ L->R atrial level shunt. Large area of LGE @ mid-ventricular level in area of max wall thickness; c. 07/2020 Cardiac CT: Asymm hypertrophy up to 185min mid inferoseptum  consistent w/ known HCM.   Morbid [place] ([place]   a. 09/2014 s/p gastric bypass.   Persistent atrial fibrillation ([place]   a. Dx 07/2019-->CHA2DS2VASc = 2 (diast CHF/Fem)-->Eliquis; b. 09/2018 s/p DCCV (150J (biphasic) x 1); c. 12/2019 RFCA/PVI; d. 07/2020 repeat RFCA/PVI.   Rash    both arms from old bed bug bites, healing   [facility]   Tobacco abuse    Typical atrial flutter ([place]   a. 12/2019 s/p RFCA.    Past Surgical History:  Procedure Laterality Date   ABDOMINAL HYSTERECTOMY     ANKLE SURGERY     left x 2   ATRIAL FIBRILLATION ABLATION N/A 12/24/2019   Procedure: ATRIAL FIBRILLATION ABLATION;  Surgeon: CaConstance HawMD;  [facility];  Service: Cardiovascular;  Laterality: N/A;   ATRIAL FIBRILLATION ABLATION N/A 08/11/2020   Procedure: ATRIAL FIBRILLATION ABLATION;  Surgeon: CaConstance HawMD;  [place];  Service: Cardiovascular;  Laterality: N/A;   CARDIOVERSION N/A 09/24/2019   Procedure: CARDIOVERSION;  Surgeon: GoMinna MerrittsMD;  Location: [facility];  Service: Cardiovascular;  Laterality: N/A;   CARDIOVERSION N/A 10/17/2019   Procedure: CARDIOVERSION;  Surgeon: Minna Merritts, MD;  Location: [facility];  Service: Cardiovascular;  Laterality: N/A;   CARDIOVERSION N/A 11/08/2021   Procedure: CARDIOVERSION;  Surgeon: Minna Merritts, MD;  Location: [facility];  Service: Cardiovascular;  Laterality: N/A;   CESAREAN SECTION     x 3   CLIPPING OF ATRIAL APPENDAGE N/A 03/06/2022   Procedure: CLIPPING OF ATRIAL APPENDAGE USING ATRICURE  CLIP SIZE 45MM;  Surgeon: Melrose Nakayama, MD;  Location: [place];  Service: Open Heart Surgery;  Laterality: N/A;   EXCISION OF ATRIAL MYXOMA N/A 03/06/2022   Procedure: RESECTION OF AORTIC VALVE TUMOR;  Surgeon: Melrose Nakayama, MD;  Location: [place];  Service: Open Heart Surgery;  Laterality: N/A;   HERNIA REPAIR     x2 one was for sure a hiatal hernia   IR ANGIO EXTRACRAN SEL  COM CAROTID INNOMINATE UNI L MOD SED  11/13/2021   IR CT HEAD LTD  11/13/2021   IR PERCUTANEOUS ART THROMBECTOMY/INFUSION INTRACRANIAL INC DIAG ANGIO  11/13/2021   IR THORACENTESIS ASP PLEURAL SPACE W/IMG GUIDE  03/14/2022   LAPAROSCOPIC GASTRIC SLEEVE RESECTION  2020   LAPAROSCOPIC GASTRIC SLEEVE RESECTION WITH HIATAL HERNIA REPAIR  08/17/2015   Procedure: LAPAROSCOPIC GASTRIC SLEEVE RESECTION WITH HIATAL HERNIA REPAIR;  Surgeon: Johnathan Hausen, MD;  [facility];  Service: General;;   MAZE N/A 03/06/2022   Procedure: MAZE;  Surgeon: Melrose Nakayama, MD;  Location: [place];  Service: Open Heart Surgery;  Laterality: N/A;   RADIOLOGY WITH ANESTHESIA N/A 11/12/2021   Procedure: IR WITH ANESTHESIA;  Surgeon: Luanne Bras, MD;  Location: [place];  Service: Radiology;  Laterality: N/A;   TEE WITHOUT CARDIOVERSION N/A 08/11/2020   Procedure: TRANSESOPHAGEAL ECHOCARDIOGRAM (TEE);  Surgeon: Constance Haw, MD;  Location: [place] [facility] LAB;  Service: Cardiovascular;  Laterality: N/A;   TEE WITHOUT CARDIOVERSION N/A 11/08/2021   Procedure: TRANSESOPHAGEAL ECHOCARDIOGRAM (TEE);  Surgeon: Minna Merritts, MD;  Location: [facility];  Service: Cardiovascular;  Laterality: N/A;   TEE WITHOUT CARDIOVERSION N/A 03/06/2022   Procedure: TRANSESOPHAGEAL ECHOCARDIOGRAM (TEE);  Surgeon: Melrose Nakayama, MD;  Location: [place];  Service: Open Heart Surgery;  Laterality: N/A;   TUBAL LIGATION     x 2   UPPER GI ENDOSCOPY  08/17/2015   Procedure: UPPER GI ENDOSCOPY;  Surgeon: Johnathan Hausen, MD;  [facility];  Service: General;;     Home Medications:  Prior to Admission medications   Medication Sig Start Date End Date Taking? Authorizing Provider  acetaminophen (TYLENOL) 500 MG tablet Take 1,000 mg by mouth every 6 (six) hours as needed (pain/headaches.).   Yes [provider]  apixaban (ELIQUIS) 5 MG TABS tablet TAKE 1 TABLET(5 MG) BY MOUTH TWICE DAILY 10/19/22  Yes Camnitz,  Ocie Doyne, MD  b complex vitamins capsule Take 1 capsule by mouth daily.   Yes [provider]  Cholecalciferol (VITAMIN D3 PO) Take 1 tablet by mouth in the morning.   Yes [provider]  cyclobenzaprine (FLEXERIL) 10 MG tablet Take 10 mg by mouth 2 (two) times daily as needed for muscle spasms. 1/2 - 1 po bid prn   Yes [provider]  furosemide (LASIX) 40 MG tablet Take 1 tablet once daily for 7 days ,then stop 04/21/22  Yes Camnitz, Will Hassell Done, MD  midodrine (PROAMATINE) 5 MG tablet Take 3 tablets (15 mg total) by mouth 2 (two) times daily with a meal. 10/19/22  Yes Camnitz, Ocie Doyne, MD  pantoprazole (  PROTONIX) 20 MG tablet Take 20 mg by mouth daily.   Yes [provider]  potassium chloride SA (KLOR-CON M20) 20 MEQ tablet Take 1 tablet once daily, with Lasix, for 7 days 04/21/22  Yes Camnitz, Will Hassell Done, MD  pravastatin (PRAVACHOL) 20 MG tablet Take 20 mg by mouth daily.   Yes [provider]  rOPINIRole (REQUIP) 0.5 MG tablet Take 0.5 mg by mouth 3 (three) times daily.   Yes [provider]    Inpatient Medications: Scheduled Meds:  aspirin EC  81 mg Oral Daily   cholecalciferol  1,000 Units Oral Daily   furosemide  40 mg Oral Daily   midodrine  15 mg Oral BID WC   pantoprazole  20 mg Oral Daily   potassium chloride  40 mEq Oral Once   pravastatin  20 mg Oral Daily   rOPINIRole  0.5 mg Oral TID   vitamin B-12  100 mcg Oral Daily   Continuous Infusions:  heparin 800 Units/hr (10/30/22 0906)   PRN Meds: acetaminophen, cyclobenzaprine, hydrALAZINE, morphine injection, nitroGLYCERIN, ondansetron (ZOFRAN) IV  Allergies:    Allergies  Allergen Reactions   Atorvastatin Nausea Only   Codeine Hives        Hydrocodone Hives    Social History:   Social History   Socioeconomic History   Marital status: Significant Other    Spouse name: Not on file   Number of children: 3   Years of education: two years of college    Highest education level: Not on file  Occupational History   Occupation: office assistant  Tobacco Use   Smoking status: Former    Packs/day: 0.25    Years: 31.00    Total pack years: 7.75    Types: Cigarettes    Quit date: 10/03/2022    Years since quitting: 0.0    Passive exposure: Current   Smokeless tobacco: Never  Vaping Use   Vaping Use: Never used  Substance and Sexual Activity   Alcohol use: Yes    Alcohol/week: 1.0 - 2.0 standard drink of alcohol    Types: 1 - 2 Standard drinks or equivalent per week    Comment: Occaional use - 1-2 mixed drinks prior to stroke since d/c nothing 11/30/21   Drug use: No   Sexual activity: Yes  Other Topics Concern   Not on file  Social History Narrative   Lives at home with significant other.   Some caffeine daily I her green tea.   Right-handed.   Social Determinants of Health   Financial Resource Strain: Low Risk  (08/22/2019)   Overall Financial Resource Strain (CARDIA)    Difficulty of Paying Living Expenses: Not hard at all  Food Insecurity: No Food Insecurity (10/30/2022)   Hunger Vital Sign    Worried About Running Out of Food in the Last Year: Never true    Ran Out of Food in the Last Year: Never true  Transportation Needs: No Transportation Needs (10/30/2022)   PRAPARE - Hydrologist (Medical): No    Lack of Transportation (Non-Medical): No  Physical Activity: Sufficiently Active (08/22/2019)   Exercise Vital Sign    Days of Exercise per Week: 7 days    Minutes of Exercise per Session: 150+ min  Stress: No Stress Concern Present (08/22/2019)   [place]    Feeling of Stress : Only a little  Social Connections: Moderately Isolated (08/22/2019)   Social Connection  and Isolation Panel [NHANES]    Frequency of Communication with Friends and Family: More than three times a week    Frequency of Social Gatherings with Friends and  Family: More than three times a week    Attends Religious Services: 1 to 4 times per year    Active Member of [employer] or Organizations: No    Attends Archivist Meetings: Never    Marital Status: Separated  Intimate Partner Violence: Unknown (10/30/2022)   Humiliation, Afraid, Rape, and Kick questionnaire    Fear of Current or Ex-Partner: No    Emotionally Abused: Not on file    Physically Abused: No    Sexually Abused: No    Family History:    Family History  Problem Relation Age of Onset   Hypertension Mother    Diabetes Mother    Heart failure Mother      ROS:  Please see the history of present illness.  Review of Systems  Constitutional:  Positive for malaise/fatigue.  Cardiovascular:  Positive for chest pain and palpitations.  Neurological:  Positive for weakness.    All other ROS reviewed and negative.     Physical Exam/Data:   Vitals:   10/30/22 0600 10/30/22 0735 10/30/22 0838 10/30/22 0845  BP: 104/78 105/74  101/68  Pulse: 94 99  100  Resp: 17 16  16  $ Temp:  97.9 F (36.6 C)  98.3 F (36.8 C)  TempSrc:  Oral    SpO2: 97% 98%  97%  Weight:   62.4 kg   Height:       No intake or output data in the 24 hours ending 10/30/22 0930    10/30/2022    8:38 AM 10/29/2022    2:51 PM 10/25/2022    8:15 AM  Last 3 Weights  Weight (lbs) 137 lb 8 oz 142 lb 3.2 oz 142 lb 6.4 oz  Weight (kg) 62.37 kg 64.5 kg 64.592 kg     Body mass index is 23.6 kg/m.  General:  Well nourished, well developed, in no acute distress HEENT: normal Neck: no JVD Vascular: No carotid bruits; Distal pulses 2+ bilaterally Cardiac:  normal S1, S2; RRR; no murmur, respirations remain unlabored on room air Lungs:  clear to auscultation bilaterally, no wheezing, rhonchi or rales  Abd: soft, nontender, no hepatomegaly  Ext: no edema Musculoskeletal:  No deformities, BUE and BLE strength normal and equal Skin: warm and dry  Neuro:  CNs 2-12 intact, no focal abnormalities noted Psych:   Normal affect   EKG:  The EKG was personally reviewed and demonstrates:  sinus rate 89, ST depression and TWI throughout diffuse leads Telemetry:  Telemetry was personally reviewed and demonstrates:  sinus rates of 80-90 with occasional multifocal PVC's and a sinus pauses noted with the longest being 2.46 seconds (strip printed to have scanned into chart)  Relevant CV Studies: TEE 03/06/22 POST-OP IMPRESSIONS  _ Left Ventricle: has hyperdynamic systolic function, with an ejection  fraction  of 75%. The cavity size was decreased. The wall motion is normal.  _ Right Ventricle: normal function. The wall motion is normal.  _ Aorta: there is no dissection present in the aorta.  _ Aortic Valve: No stenosis present. There is trace central regurgitation.  _ Mitral Valve: There is moderate regurgitation.  _ Tricuspid Valve: There is moderate regurgitation.   Laboratory Data:  High Sensitivity Troponin:   Recent Labs  Lab 10/29/22 1514 10/29/22 1639 10/29/22 1918 10/30/22 0010 10/30/22 0421  TROPONINIHS  41* 40* 44* 37* 37*     Chemistry Recent Labs  Lab 10/25/22 0831 10/29/22 1514 10/30/22 0421  NA 142 141 139  K 3.9 3.5 3.2*  CL 106 107 108  CO2 21 23 23  $ GLUCOSE 85 90 85  BUN 14 19 20  $ CREATININE 0.80 0.92 0.89  CALCIUM 9.5 9.4 8.7*  GFRNONAA  --  >60 >60  ANIONGAP  --  11 8    No results for input(s): "PROT", "ALBUMIN", "AST", "ALT", "ALKPHOS", "BILITOT" in the last 168 hours. Lipids  Recent Labs  Lab 10/29/22 1639  CHOL 153  TRIG 35  HDL 71  LDLCALC 75  CHOLHDL 2.2    Hematology Recent Labs  Lab 10/25/22 0831 10/29/22 1514 10/30/22 0421  WBC 7.2 8.5 8.1  RBC 4.01 4.08 3.96  HGB 11.5 11.9* 11.3*  HCT 36.4 36.6 35.7*  MCV 91 89.7 90.2  MCH 28.7 29.2 28.5  MCHC 31.6 32.5 31.7  RDW 15.6* 17.6* 17.4*  PLT 365 340 283   Thyroid No results for input(s): "TSH", "FREET4" in the last 168 hours.  BNP Recent Labs  Lab 10/29/22 1622  BNP 439.5*    DDimer  No results for input(s): "DDIMER" in the last 168 hours.   Radiology/Studies:  DG Chest 2 View  Result Date: 10/29/2022 CLINICAL DATA:  Chest tightness EXAM: CHEST - 2 VIEW COMPARISON:  A 1523 FINDINGS: No focal consolidation. No pleural effusion or pneumothorax. Heart and mediastinal contours are stable. Left atrial appendage clipping. Prior median sternotomy. No acute osseous abnormality. IMPRESSION: No active cardiopulmonary disease. Electronically Signed   By: Kathreen Devoid M.D.   On: 10/29/2022 15:35     Assessment and Plan:   NSTEMI with chest pain, history of mixed nonobstructive coronary artery disease found on coronary CTA -Patient arrived with abrupt onset of chest discomfort that radiated into the upper left chest and down the left arm -Chest pain and pressure has resolved but she continues to have a odd sensation -Continued on heparin infusion in place of apixaban, last dose of apixaban was 07 30 on 10/29/2022 -High-sensitivity troponins trended 40, 44, 37, and 37 -Previous coronary CTA revealed coronary calcium score of 393 which is 99th percentile for age and sex matched control, normal coronary origin with right dominance, minimal mixed nonobstructive CAD with a CAD RADS 1 with limited findings due to lack of nitroglycerin administration -Continued on aspirin, and pravastatin -Continued on as needed morphine and Nitrostat -Continue with telemetry monitoring -EKG as needed for pain or changes -Remains n.p.o. for pending heart catheterization  Chronic diastolic congestive heart failure -Previous echocardiogram revealed LVEF of 60-65% on 11/13/2021 -Patient is euvolemic on exam -No complaints of shortness of breath -Continued on furosemide 40 mg daily -Respirations are unlabored on room air -BNP 439.5 -Daily weight, I's and O's, low-sodium diet  Hypokalemia -Serum potassium 3.2 -Supplementations ordered by primary team -Daily BMP -Monitor/trend/replete electrolytes as  needed -Recommended to keep potassium <4 and >5  Chronic Atrial fibrillation s/p MAZE -Previously had been on apixaban for CHA2DS2-VASc score of 4, apixaban is on hold patient is currently on heparin infusion per pharmacy dosing -Patient stated when she followed up with her primary care provider recently she was found to be back in atrial fibrillation with a rate of 110 -Normal sinus noted on telemetry monitoring -Continue with telemetry monitoring  Hyperlipidemia -LDL 75 -Continued on pravastatin  Hypertrophic cardiomyopathy -Initially found on cardiac MRI -Has LGE  Polysubstance abuse -Patient stated that she quit  smoking approximately 3 weeks prior -Urine drug screen positive for marijuana -Endorses current alcohol use of 1-2 standard drinks per week -Cessation is recommended  History of a stroke -On aspirin and statin   Risk Assessment/Risk Scores:     TIMI Risk Score for Unstable Angina or Non-ST Elevation MI:   The patient's TIMI risk score is 5, which indicates a 26% risk of all cause mortality, new or recurrent myocardial infarction or need for urgent revascularization in the next 14 days.  New York Heart Association (NYHA) Functional Class NYHA Class II  CHA2DS2-VASc Score = 4   This indicates a 4.8% annual risk of stroke. The patient's score is based upon: CHF History: 1 HTN History: 0 Diabetes History: 0 Stroke History: 2 Vascular Disease History: 0 Age Score: 0 Gender Score: 1         For questions or updates, please contact [place] Please consult [email or web address] for contact info under    Signed, Demarion Pondexter, NP  10/30/2022 9:30 AM

## 2022-10-30 NOTE — Progress Notes (Signed)
PROGRESS NOTE    Hannah Zimmerman  [id number] DOB: [birth year] DOA: 10/29/2022 PCP: Donnamarie Rossetti, PA-C    Brief Narrative:  This 52 years old female with PMH significant for A-fib on Eliquis (s/p maze) hyperlipidemia, stroke, diastolic CHF, pulmonary hypertension, CAD, depression,  s/p sleeve gastrectomy presented in the ED with complaints of chest pain. She describes chest pain on the left side under her breast associated with some shortness of breath,  4 /10 on pain intensity radiating towards both shoulders and neck. She denies any recent travel, She was given aspirin 325 on arrival. She was found to have troponin 41.  EKG shows diffuse T wave inversion in lateral leads V4 to V6 and inferior leads.  Patient was admitted for further evaluation. Cardiology is consulted.  Patient is a scheduled to have left heart cath tomorrow.  Assessment & Plan:   Principal Problem:   NSTEMI (non-ST elevated myocardial infarction) ([place]) Active Problems:   Chronic diastolic CHF (congestive heart failure) (HCC)   Atrial fibrillation, chronic (HCC)   HLD (hyperlipidemia)   Depression   Stroke [facility])  NSTEMI: Patient presented with chest pain under left breast, troponin 41.   EKG shows diffuse T wave inversion in lateral leads V4 to V5 in inferior leads. Patient started on IV heparin and Eliquis was kept on hold.   Troponin 41> 40> 44> 37>37 Continue morphine as needed,  nitroglycerin for pain control. Aspirin 81 mg daily and pravastatin Risk factor modification.   Cardiology is consulted.  Patient is scheduled for left heart cath tomorrow.  Chronic diastolic CHF: Recent echo shows LVEF 60 to 65%.  Patient has trace leg edema, no JVD.  No shortness of breath.  Seems CHF is compensated. Continue home Lasix 40 mg daily  Chronic atrial fibrillation: Heart rate is well-controlled.   Switched Eliquis to IV heparin.   She might require left heart cath.  Hyperlipidemia : Continue  pravastatin  Depression: Continue Requip  History of stroke: Continue pravastatin and patient was on Eliquis at home.   DVT prophylaxis: Heparin IV Code Status: Full code. Family Communication: No family at bed side. Disposition Plan:   Status is: Inpatient Remains inpatient appropriate because: Admitted for NSTEMI, started on IV heparin.  Cardiology is consulted patient is scheduled for left heart cath tomorrow.   Consultants:  Cardiology  Procedures: Echo  Antimicrobials:  Anti-infectives (From admission, onward)    None       Subjective: Patient seen and examined at bedside.  Overnight events noted.  Patient denies any chest pain but reports having some tiredness and fatigue. Patient is scheduled for left heart cath tomorrow morning.  Objective: Vitals:   10/30/22 0735 10/30/22 0838 10/30/22 0845 10/30/22 1101  BP: 105/74  101/68 98/63  Pulse: 99  100 92  Resp: 16  16 16  $ Temp: 97.9 F (36.6 C)  98.3 F (36.8 C) 98.3 F (36.8 C)  TempSrc: Oral   Oral  SpO2: 98%  97% 98%  Weight:  62.4 kg    Height:       No intake or output data in the 24 hours ending 10/30/22 1258 Filed Weights   10/29/22 1451 10/30/22 0838  Weight: 64.5 kg 62.4 kg    Examination:  General exam: Appears calm and comfortable , not in any acute distress. Respiratory system: Clear to auscultation. Respiratory effort normal.  RR 16. Cardiovascular system: S1 & S2 heard, regular rate and rhythm, no murmur Gastrointestinal system: Abdomen is soft, non tender,  non distended, BS+ Central nervous system: Alert and oriented x 3. No focal neurological deficits. Extremities: No edema, no cyanosis, no clubbing Skin: No rashes, lesions or ulcers Psychiatry: Judgement and insight appear normal. Mood & affect appropriate.     Data Reviewed: I have personally reviewed following labs and imaging studies  CBC: Recent Labs  Lab 10/25/22 0831 10/29/22 1514 10/30/22 0421  WBC 7.2 8.5 8.1  HGB  11.5 11.9* 11.3*  HCT 36.4 36.6 35.7*  MCV 91 89.7 90.2  PLT 365 340 [id number]   Basic Metabolic Panel: Recent Labs  Lab 10/25/22 0831 10/29/22 1514 10/30/22 0421  NA 142 141 139  K 3.9 3.5 3.2*  CL 106 107 108  CO2 21 23 23  $ GLUCOSE 85 90 85  BUN 14 19 20  $ CREATININE 0.80 0.92 0.89  CALCIUM 9.5 9.4 8.7*   GFR: Estimated Creatinine Clearance: 64.6 mL/min (by C-G formula based on SCr of 0.89 mg/dL). Liver Function Tests: No results for input(s): "AST", "ALT", "ALKPHOS", "BILITOT", "PROT", "ALBUMIN" in the last 168 hours. No results for input(s): "LIPASE", "AMYLASE" in the last 168 hours. No results for input(s): "AMMONIA" in the last 168 hours. Coagulation Profile: Recent Labs  Lab 10/30/22 0010  INR 1.3*   Cardiac Enzymes: No results for input(s): "CKTOTAL", "CKMB", "CKMBINDEX", "TROPONINI" in the last 168 hours. BNP (last 3 results) No results for input(s): "PROBNP" in the last 8760 hours. HbA1C: Recent Labs    10/30/22 0421  HGBA1C 6.1*   CBG: No results for input(s): "GLUCAP" in the last 168 hours. Lipid Profile: Recent Labs    10/29/22 1639  CHOL 153  HDL 71  LDLCALC 75  TRIG 35  CHOLHDL 2.2   Thyroid Function Tests: No results for input(s): "TSH", "T4TOTAL", "FREET4", "T3FREE", "THYROIDAB" in the last 72 hours. Anemia Panel: No results for input(s): "VITAMINB12", "FOLATE", "FERRITIN", "TIBC", "IRON", "RETICCTPCT" in the last 72 hours. Sepsis Labs: No results for input(s): "PROCALCITON", "LATICACIDVEN" in the last 168 hours.  No results found for this or any previous visit (from the past 240 hour(s)).   Radiology Studies: ECHOCARDIOGRAM COMPLETE  Result Date: 10/30/2022    ECHOCARDIOGRAM REPORT   Patient Name:   Hannah Zimmerman Date of Exam: 10/30/2022 Medical Rec #:  [id number]        Height:       64.0 in Accession #:    [id number]       Weight:       137.5 lb Date of Birth:  [birth year]       BSA:          1.668 m Patient Age:    16 years         BP:            101/68 mmHg Patient Gender: F                HR:           100 bpm. Exam Location:  [facility] Procedure: 2D Echo, Color Doppler and Cardiac Doppler Indications:     Acute myocardail infarction I21.9  History:         Patient has prior history of Echocardiogram examinations, most                  recent 11/13/2021. Stroke; Signs/Symptoms:Murmur. Hypertrophic                  cardiomyopathy.  Sonographer:     Sherrie Sport Referring Phys:  [phone]  MUHAMMAD A ARIDA Diagnosing Phys: Kathlyn Sacramento MD IMPRESSIONS  1. Left ventricular ejection fraction, by estimation, is 65 to 70%. The left ventricle has normal function. The left ventricle has no regional wall motion abnormalities. There is moderate asymmetric left ventricular hypertrophy of the septal segment. Left ventricular diastolic parameters are indeterminate. No significant LVOT gradient even with Valsalva.  2. Right ventricular systolic function is normal. The right ventricular size is normal. There is mildly elevated pulmonary artery systolic pressure.  3. Left atrial size was mildly dilated.  4. Right atrial size was mildly dilated.  5. The mitral valve is normal in structure. Mild mitral valve regurgitation. No evidence of mitral stenosis.  6. Tricuspid valve regurgitation is moderate.  7. The aortic valve is normal in structure. Aortic valve regurgitation is not visualized. No aortic stenosis is present.  8. The inferior vena cava is normal in size with <50% respiratory variability, suggesting right atrial pressure of 8 mmHg. FINDINGS  Left Ventricle: Left ventricular ejection fraction, by estimation, is 65 to 70%. The left ventricle has normal function. The left ventricle has no regional wall motion abnormalities. The left ventricular internal cavity size was normal in size. There is  moderate asymmetric left ventricular hypertrophy of the septal segment. Left ventricular diastolic parameters are indeterminate. Right Ventricle: The right ventricular size is  normal. No increase in right ventricular wall thickness. Right ventricular systolic function is normal. There is mildly elevated pulmonary artery systolic pressure. The tricuspid regurgitant velocity is 2.78  m/s, and with an assumed right atrial pressure of 8 mmHg, the estimated right ventricular systolic pressure is [id number]. Left Atrium: Left atrial size was mildly dilated. Right Atrium: Right atrial size was mildly dilated. Pericardium: There is no evidence of pericardial effusion. Mitral Valve: The mitral valve is normal in structure. Mild mitral valve regurgitation. No evidence of mitral valve stenosis. Tricuspid Valve: The tricuspid valve is normal in structure. Tricuspid valve regurgitation is moderate . No evidence of tricuspid stenosis. Aortic Valve: The aortic valve is normal in structure. Aortic valve regurgitation is not visualized. No aortic stenosis is present. Aortic valve mean gradient measures 3.0 mmHg. Aortic valve peak gradient measures 5.5 mmHg. Aortic valve area, by VTI measures 3.36 cm. Pulmonic Valve: The pulmonic valve was normal in structure. Pulmonic valve regurgitation is not visualized. No evidence of pulmonic stenosis. Aorta: The aortic root is normal in size and structure. Venous: The inferior vena cava is normal in size with less than 50% respiratory variability, suggesting right atrial pressure of 8 mmHg. IAS/Shunts: No atrial level shunt detected by color flow Doppler.  LEFT VENTRICLE PLAX 2D LVIDd:         3.80 cm LVIDs:         2.40 cm LV PW:         1.50 cm LV IVS:        1.50 cm LVOT diam:     2.00 cm LV SV:         53 LV SV Index:   32 LVOT Area:     3.14 cm  RIGHT VENTRICLE RV S prime:     11.00 cm/s TAPSE (M-mode): 1.2 cm LEFT ATRIUM             Index        RIGHT ATRIUM           Index LA diam:        4.00 cm 2.40 cm/m   RA Area:  21.30 cm LA Vol (A2C):   95.2 ml 57.06 ml/m  RA Volume:   65.10 ml  39.02 ml/m LA Vol (A4C):   71.5 ml 42.86 ml/m LA Biplane Vol: 82.8  ml 49.63 ml/m  AORTIC VALVE AV Area (Vmax):    2.68 cm AV Area (Vmean):   2.72 cm AV Area (VTI):     3.36 cm AV Vmax:           117.00 cm/s AV Vmean:          80.400 cm/s AV VTI:            0.158 m AV Peak Grad:      5.5 mmHg AV Mean Grad:      3.0 mmHg LVOT Vmax:         99.80 cm/s LVOT Vmean:        69.700 cm/s LVOT VTI:          0.169 m LVOT/AV VTI ratio: 1.07  AORTA Ao Root diam: 2.80 cm MITRAL VALVE               TRICUSPID VALVE MV Area (PHT): 3.56 cm    TR Peak grad:   30.9 mmHg MV Decel Time: 213 msec    TR Vmax:        278.00 cm/s MV E velocity: 76.50 cm/s                            SHUNTS                            Systemic VTI:  0.17 m                            Systemic Diam: 2.00 cm Kathlyn Sacramento MD Electronically signed by Kathlyn Sacramento MD Signature Date/Time: 10/30/2022/12:30:41 PM    Final    DG Chest 2 View  Result Date: 10/29/2022 CLINICAL DATA:  Chest tightness EXAM: CHEST - 2 VIEW COMPARISON:  A 1523 FINDINGS: No focal consolidation. No pleural effusion or pneumothorax. Heart and mediastinal contours are stable. Left atrial appendage clipping. Prior median sternotomy. No acute osseous abnormality. IMPRESSION: No active cardiopulmonary disease. Electronically Signed   By: Kathreen Devoid M.D.   On: 10/29/2022 15:35    Scheduled Meds:  aspirin EC  81 mg Oral Daily   cholecalciferol  1,000 Units Oral Daily   furosemide  40 mg Oral Daily   midodrine  15 mg Oral BID WC   pantoprazole  20 mg Oral Daily   pravastatin  20 mg Oral Daily   rOPINIRole  0.5 mg Oral TID   vitamin B-12  100 mcg Oral Daily   Continuous Infusions:  heparin 800 Units/hr (10/30/22 0906)     LOS: 0 days    Time spent: 50 mins    Celines Femia, MD [facility]   If 7PM-7AM, please contact night-coverage

## 2022-10-30 NOTE — ED Notes (Signed)
Pt states nauseated and has a slight HA. Rates pain a 3, medications given.

## 2022-10-30 NOTE — Progress Notes (Signed)
*  PRELIMINARY RESULTS* Echocardiogram 2D Echocardiogram has been performed.  Sherrie Sport 10/30/2022, 10:56 AM

## 2022-10-30 NOTE — Consult Note (Signed)
ANTICOAGULATION CONSULT NOTE  Pharmacy Consult for heparin infusion Indication: chest pain/ACS  Allergies  Allergen Reactions   Atorvastatin Nausea Only   Codeine Hives        Hydrocodone Hives    Patient Measurements: Height: 5' 4"$  (162.6 cm) Weight: 62.4 kg (137 lb 8 oz) IBW/kg (Calculated) : 54.7 Heparin Dosing Weight: 64.5 kg  Vital Signs: Temp: 98.3 F (36.8 C) (02/12 0845) Temp Source: Oral (02/12 0735) BP: 101/68 (02/12 0845) Pulse Rate: 100 (02/12 0845)  Labs: Recent Labs    10/29/22 1514 10/29/22 1639 10/29/22 1918 10/30/22 0010 10/30/22 0421 10/30/22 0746  HGB 11.9*  --   --   --  11.3*  --   HCT 36.6  --   --   --  35.7*  --   PLT 340  --   --   --  283  --   APTT  --   --   --  124*  --  63*  LABPROT  --   --   --  15.8*  --   --   INR  --   --   --  1.3*  --   --   HEPARINUNFRC  --   --   --   --   --  >1.10*  CREATININE 0.92  --   --   --  0.89  --   TROPONINIHS 41*   < > 44* 37* 37*  --    < > = values in this interval not displayed.     Estimated Creatinine Clearance: 64.6 mL/min (by C-G formula based on SCr of 0.89 mg/dL).   Medical History: Past Medical History:  Diagnosis Date   (HFpEF) heart failure with preserved ejection fraction ([place])    a. 07/2019 Echo: EF 60-65%, mod LVH. Sev dil LA, mildly dil RA. Mod elev PASP; b. 07/2020 TEE: EF 50-55%, no rwma, sev conc LVH. Nl RV fxn. Mod dil RA. Mild-mod MR, mod TR.   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 07/2020 Cardiac CT: Cor Ca2+ = 338 (99th %'ile).   Complication of anesthesia 20 years ago   a. spinal with first c section went too high stopped breathing, low bp with 2nd c section, no further issues with anesthesia   Heart murmur    a. 07/2019 Echo: no significant valvular dzs; b. 07/2020 TEE: Mild-mod MR, mod TR.   History of hiatal hernia    Hypertrophic cardiomyopathy ([place])    a. 07/2019 Echo: Mod LVH; b. 01/2020 cMRI: EF 67%, HCM w/o obstruction. Max wall  thickness 49m. Sev LAE w/ L->R atrial level shunt. Large area of LGE @ mid-ventricular level in area of max wall thickness; c. 07/2020 Cardiac CT: Asymm hypertrophy up to 131min mid inferoseptum consistent w/ known HCM.   Morbid [place] ([facility]   a. 09/2014 s/p gastric bypass.   Persistent atrial fibrillation ([facility]   a. Dx 07/2019-->CHA2DS2VASc = 2 (diast CHF/Fem)-->Eliquis; b. 09/2018 s/p DCCV (150J (biphasic) x 1); c. 12/2019 RFCA/PVI; d. 07/2020 repeat RFCA/PVI.   Rash    both arms from old bed bug bites, healing   [facility]   Tobacco abuse    Typical atrial flutter ([facility]   a. 12/2019 s/p RFCA.    Medications:  PTA: Eliquis 57m26mID (last dose 2/11 at 0700)  Inpatient: Heparin infusion (2/11 >>) Allergies: NO AC/APT related allergies  Assessment: [age]ar old female with history of  atrial fibirillation on eliquis, presents to ED with complaints of chest pain. Pharmacy consulted for management of heparin infusion in the setting of suspected NSTEMI.   Date Time aPTT/HL Rate/Comment 2/12 0010 aPTT 124 Supratherapeutic at 900 un/hr 2/12` 0746 aPTT 63 Subtherapeutic at 750un/hr  Goal of Therapy:  Heparin level 0.3-0.7 units/ml aPTT 66-102 seconds Monitor platelets by anticoagulation protocol: Yes   Plan:  Give 900 units heparin bolus Increase heparin infusion rate to 800 units/hr Check aPTT/Anti-Xa level 6 hours after rate change and daily once consecutively therapeutic.  Titrate by aPTT's until lab correlation is noted, then titrate by anti-xa alone. Continue to monitor H&H and platelets daily while on heparin gtt.      Tavi Gaughran Rodriguez-Guzman PharmD, BCPS 10/30/2022 8:49 AM

## 2022-10-30 NOTE — Progress Notes (Signed)
Pt ambulatory to toilet - standby assist

## 2022-10-30 NOTE — Clinical Note (Incomplete)
Report received from floor RN, RN states patient doesn't want to sign procedural consent until she speaks  with the performing MD

## 2022-10-31 ENCOUNTER — Encounter: Payer: Self-pay | Admitting: Cardiovascular Disease

## 2022-10-31 DIAGNOSIS — I214 Non-ST elevation (NSTEMI) myocardial infarction: Secondary | ICD-10-CM | POA: Diagnosis not present

## 2022-10-31 DIAGNOSIS — I2489 Other forms of acute ischemic heart disease: Secondary | ICD-10-CM | POA: Diagnosis not present

## 2022-10-31 LAB — CBC
HCT: 37.3 % (ref 36.0–46.0)
Hemoglobin: 12 g/dL (ref 12.0–15.0)
MCH: 28.8 pg (ref 26.0–34.0)
MCHC: 32.2 g/dL (ref 30.0–36.0)
MCV: 89.4 fL (ref 80.0–100.0)
Platelets: 269 10*3/uL (ref 150–400)
RBC: 4.17 MIL/uL (ref 3.87–5.11)
RDW: 17.6 % — ABNORMAL HIGH (ref 11.5–15.5)
WBC: 4.7 10*3/uL (ref 4.0–10.5)
nRBC: 0 % (ref 0.0–0.2)

## 2022-10-31 LAB — BASIC METABOLIC PANEL
Anion gap: 11 (ref 5–15)
BUN: 20 mg/dL (ref 6–20)
CO2: 23 mmol/L (ref 22–32)
Calcium: 9.1 mg/dL (ref 8.9–10.3)
Chloride: 106 mmol/L (ref 98–111)
Creatinine, Ser: 0.93 mg/dL (ref 0.44–1.00)
GFR, Estimated: >60 mL/min (ref >60)
Glucose, Bld: 90 mg/dL (ref 70–99)
Potassium: 3.2 mmol/L — ABNORMAL LOW (ref 3.5–5.1)
Sodium: 140 mmol/L (ref 135–145)

## 2022-10-31 LAB — APTT
aPTT: 65 seconds — ABNORMAL HIGH (ref 24–36)
aPTT: 87 seconds — ABNORMAL HIGH (ref 24–36)

## 2022-10-31 LAB — CORTISOL: Cortisol, Plasma: 9.4 ug/dL

## 2022-10-31 LAB — HEPARIN LEVEL (UNFRACTIONATED): Heparin Unfractionated: >1.1 IU/mL — ABNORMAL HIGH (ref 0.30–0.70)

## 2022-10-31 LAB — MAGNESIUM: Magnesium: 1.9 mg/dL (ref 1.7–2.4)

## 2022-10-31 LAB — PHOSPHORUS: Phosphorus: 4.2 mg/dL (ref 2.5–4.6)

## 2022-10-31 MED ORDER — APIXABAN 5 MG PO TABS: 5.0000 mg | ORAL_TABLET | Freq: Two times a day (BID) | ORAL | Status: DC

## 2022-10-31 MED ORDER — FUROSEMIDE 40 MG PO TABS: 40.0000 mg | ORAL_TABLET | Freq: Every day | ORAL | 2 refills | Status: DC

## 2022-10-31 MED ORDER — POTASSIUM CHLORIDE 20 MEQ PO PACK
40.0000 meq | PACK | Freq: Once | ORAL | Status: AC
  Administered 2022-10-31: 40 meq via ORAL
  Filled 2022-10-31: qty 2

## 2022-10-31 MED ORDER — ASPIRIN 81 MG PO TBEC: 81.0000 mg | DELAYED_RELEASE_TABLET | Freq: Every day | ORAL | 12 refills | Status: AC

## 2022-10-31 MED ORDER — HEPARIN BOLUS VIA INFUSION
950.0000 [IU] | Freq: Once | INTRAVENOUS | Status: AC
  Administered 2022-10-31: 950 [IU] via INTRAVENOUS
  Filled 2022-10-31: qty 950

## 2022-10-31 NOTE — Progress Notes (Signed)
Rounding Note    Patient Name: Hannah Zimmerman Date of Encounter: 10/31/2022  [place] Cardiologist: [name], MD   Subjective   Patients seen on AM rounds. Denies any recurrent chest pain or shortness of breath. No over night events have been recorded. Dr Caryl Comes has seen her and updated her on the next steps in treatment from the EP standpoint.  Inpatient Medications    Scheduled Meds:  aspirin EC  81 mg Oral Daily   cholecalciferol  1,000 Units Oral Daily   furosemide  40 mg Oral Daily   midodrine  15 mg Oral BID WC   pantoprazole  20 mg Oral Daily   potassium chloride  40 mEq Oral Once   pravastatin  20 mg Oral Daily   rOPINIRole  0.5 mg Oral TID   sodium chloride flush  3 mL Intravenous Q12H   vitamin B-12  100 mcg Oral Daily   Continuous Infusions:  sodium chloride     heparin 900 Units/hr (10/31/22 0700)   PRN Meds: sodium chloride, acetaminophen, cyclobenzaprine, morphine injection, nitroGLYCERIN, ondansetron (ZOFRAN) IV, sodium chloride flush   Vital Signs    Vitals:   10/31/22 0413 10/31/22 0800 10/31/22 0855 10/31/22 0903  BP:  110/79  (!) 126/93  Pulse:    87  Resp: 16 16  16  $ Temp:  97.9 F (36.6 C)  98.2 F (36.8 C)  TempSrc:  Oral  Oral  SpO2:    100%  Weight:   60.6 kg   Height:        Intake/Output Summary (Last 24 hours) at 10/31/2022 0911 Last data filed at 10/31/2022 0700 Gross per 24 hour  Intake 616.32 ml  Output --  Net 616.32 ml      10/31/2022    8:55 AM 10/30/2022    8:38 AM 10/29/2022    2:51 PM  Last 3 Weights  Weight (lbs) 133 lb 11.2 oz 137 lb 8 oz 142 lb 3.2 oz  Weight (kg) 60.646 kg 62.37 kg 64.5 kg      Telemetry    Sinus with sinus pause rates 70-90 - Personally Reviewed  ECG    No new tracings - Personally Reviewed  Physical Exam   GEN: No acute distress.   Neck: No JVD Cardiac: RRR, no murmurs, rubs, or gallops, right radial cath site with opsite and gauze dressing clean/dry/intact, 2+  pulse, no bleeding or hematoma noted at site Respiratory: Clear to auscultation bilaterally, respirations are unlabored on room air GI: Soft, nontender, non-distended  MS: No edema; No deformity. Neuro:  Nonfocal  Psych: Normal affect   Labs    High Sensitivity Troponin:   Recent Labs  Lab 10/29/22 1514 10/29/22 1639 10/29/22 1918 10/30/22 0010 10/30/22 0421  TROPONINIHS 41* 40* 44* 37* 37*     Chemistry Recent Labs  Lab 10/29/22 1514 10/30/22 0421 10/31/22 0132  NA 141 139 140  K 3.5 3.2* 3.2*  CL 107 108 106  CO2 23 23 23  $ GLUCOSE 90 85 90  BUN 19 20 20  $ CREATININE 0.92 0.89 0.93  CALCIUM 9.4 8.7* 9.1  MG  --   --  1.9  GFRNONAA >60 >60 >60  ANIONGAP 11 8 11    $ Lipids  Recent Labs  Lab 10/29/22 1639  CHOL 153  TRIG 35  HDL 71  LDLCALC 75  CHOLHDL 2.2    Hematology Recent Labs  Lab 10/29/22 1514 10/30/22 0421 10/31/22 0132  WBC 8.5 8.1 4.7  RBC 4.08 3.96 4.17  HGB 11.9* 11.3* 12.0  HCT 36.6 35.7* 37.3  MCV 89.7 90.2 89.4  MCH 29.2 28.5 28.8  MCHC 32.5 31.7 32.2  RDW 17.6* 17.4* 17.6*  PLT 340 283 269   Thyroid No results for input(s): "TSH", "FREET4" in the last 168 hours.  BNP Recent Labs  Lab 10/29/22 1622  BNP 439.5*    DDimer No results for input(s): "DDIMER" in the last 168 hours.   Radiology      Cardiac Studies  TTE 10/30/22 1. Left ventricular ejection fraction, by estimation, is 65 to 70%. The  left ventricle has normal function. The left ventricle has no regional  wall motion abnormalities. There is moderate asymmetric left ventricular  hypertrophy of the septal segment.  Left ventricular diastolic parameters are indeterminate. No significant  LVOT gradient even with Valsalva.   2. Right ventricular systolic function is normal. The right ventricular  size is normal. There is mildly elevated pulmonary artery systolic  pressure.   3. Left atrial size was mildly dilated.   4. Right atrial size was mildly dilated.   5. The  mitral valve is normal in structure. Mild mitral valve  regurgitation. No evidence of mitral stenosis.   6. Tricuspid valve regurgitation is moderate.   7. The aortic valve is normal in structure. Aortic valve regurgitation is  not visualized. No aortic stenosis is present.   8. The inferior vena cava is normal in size with <50% respiratory  variability, suggesting right atrial pressure of 8 mmHg.   LHC 10/30/22   Mid RCA to Dist RCA lesion is 20% stenosed.   1. mild nonobstructive coronary artery disease. 2.  Left ventricular angiography was not performed.  EF was normal by echo. 3.  Typical spike and dome appearance of left ventricular tracing consistent with hypertrophic cardiomyopathy.  Mild resting unprovoked LVOT obstruction with peak systolic gradient of 26 mmHg.   Recommendations: Suspect that elevated troponin is likely due to supply demand mismatch in the setting of hypertrophic cardiomyopathy likely with underlying uncontrolled tachycardia.  I am hesitant to add a beta-blocker given history of junctional bradycardia as well as documented 2.4-second pauses on telemetry.  I consulted Dr. 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     Patient Profile     52 y.o. female with a history of persistent atrial fibrillation status post catheter ablation, atypical atrial flutter status post catheter ablation, chronic HFpEF, hypertrophic cardiomyopathy, coronary calcifications, obesity status post gastric bypass, CVA, and tobacco abuse who has been seen and evaluated for NSTEMI.  Assessment & Plan    NSTEMI status post left heart catheterization -Left heart catheterization completed 10/30/2022 revealed mild RCA and distal RCA lesion that was only 20% stenosis -Currently remains chest pain-free -Continued on aspirin and pravastatin -Discontinue heparin infusion and restart apixaban as there is currently no plans for pacemaker  insertion -Hesitant to place on beta-blocker therapy given history of junctional bradycardia as well as documented 2.4-second pause previously on telemetry -Continue telemetry monitoring -EKG as needed for pain or changes  Chronic diastolic congestive heart failure -Echocardiogram completed 10/30/2022 which revealed LVEF of 65-70%, no regional wall motion abnormalities, mild mitral regurgitation, moderate tricuspid regurgitation -Patient euvolemic on exam -Denies shortness of breath maintaining oxygen saturations on room air -Continue furosemide 40 mg daily -Daily weights, I's and O's, low-sodium diet  Chronic atrial fibrillation status post MAZE -Previously on apixaban for CHA2DS2-VASc score 4, apixaban on hold as she  is currently on heparin infusion -Once heparin is discontinued restart apixaban -Patient has been evaluated by EP and advised that she will need to wear a Holter monitor on discharge and have genetic testing completed at [facility] for HOCM -Currently sinus with sinus pause and unifocal PVCs with rates of 70-90 bpm on telemetry  Hypokalemia -Serum potassium 3.2 -40 mEq supplementation given this morning -Daily BMP -Monitor/trend/replete electrolytes as needed -Recommend keeping potassium 4 or greater and less than 5  Hypertrophic cardiomyopathy -Initially found on cardiac MRI -Noted to have LGE -Is to undergo genetic testing at [facility] after discharge  Hypertension with episodes of hypotension -blood pressure 110/79 -Currently stable -Remains on furosemide 40 mg daily and midodrine 50 mg twice daily -Vital signs per unit protocol  History of stroke -Has been continued on aspirin and statin  Polysubstance abuse -Recommend cessation    For questions or updates, please contact [place] Please consult [email or web address] for contact info under        Signed, Javanna Patin, NP  10/31/2022, 9:11 AM

## 2022-10-31 NOTE — Consult Note (Signed)
ANTICOAGULATION CONSULT NOTE  Pharmacy Consult for heparin infusion Indication: chest pain/ACS  Allergies  Allergen Reactions   Atorvastatin Nausea Only   Codeine Hives        Hydrocodone Hives    Patient Measurements: Height: 5' 4"$  (162.6 cm) Weight: 60.6 kg (133 lb 11.2 oz) IBW/kg (Calculated) : 54.7 Heparin Dosing Weight: 64.5 kg  Vital Signs: Temp: 98.2 F (36.8 C) (02/13 0903) Temp Source: Oral (02/13 0903) BP: 126/93 (02/13 0903) Pulse Rate: 87 (02/13 0903)  Labs: Recent Labs    10/29/22 1514 10/29/22 1514 10/29/22 1639 10/29/22 1918 10/30/22 0010 10/30/22 0421 10/30/22 0746 10/31/22 0132 10/31/22 0911  HGB 11.9*  --   --   --   --  11.3*  --  12.0  --   HCT 36.6  --   --   --   --  35.7*  --  37.3  --   PLT 340  --   --   --   --  283  --  269  --   APTT  --    < >  --   --  124*  --  63* 65* 87*  LABPROT  --   --   --   --  15.8*  --   --   --   --   INR  --   --   --   --  1.3*  --   --   --   --   HEPARINUNFRC  --   --   --   --   --   --  >1.10* >1.10*  --   CREATININE 0.92  --   --   --   --  0.89  --  0.93  --   TROPONINIHS 41*  --    < > 44* 37* 37*  --   --   --    < > = values in this interval not displayed.     Estimated Creatinine Clearance: 61.8 mL/min (by C-G formula based on SCr of 0.93 mg/dL).   Medical History: Past Medical History:  Diagnosis Date   (HFpEF) heart failure with preserved ejection fraction ([place])    a. 07/2019 Echo: EF 60-65%, mod LVH. Sev dil LA, mildly dil RA. Mod elev PASP; b. 07/2020 TEE: EF 50-55%, no rwma, sev conc LVH. Nl RV fxn. Mod dil RA. Mild-mod MR, mod TR.   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 07/2020 Cardiac CT: Cor Ca2+ = 338 (99th %'ile).   Complication of anesthesia 20 years ago   a. spinal with first c section went too high stopped breathing, low bp with 2nd c section, no further issues with anesthesia   Heart murmur    a. 07/2019 Echo: no significant valvular dzs; b.  07/2020 TEE: Mild-mod MR, mod TR.   History of hiatal hernia    Hypertrophic cardiomyopathy ([place])    a. 07/2019 Echo: Mod LVH; b. 01/2020 cMRI: EF 67%, HCM w/o obstruction. Max wall thickness 36m. Sev LAE w/ L->R atrial level shunt. Large area of LGE @ mid-ventricular level in area of max wall thickness; c. 07/2020 Cardiac CT: Asymm hypertrophy up to 172min mid inferoseptum consistent w/ known HCM.   Morbid [place] ([place]   a. 09/2014 s/p gastric bypass.   Persistent atrial fibrillation ([place]   a. Dx 07/2019-->CHA2DS2VASc = 2 (diast CHF/Fem)-->Eliquis; b. 09/2018 s/p DCCV (150J (biphasic) x 1); c. 12/2019 RFCA/PVI; d.  07/2020 repeat RFCA/PVI.   Rash    both arms from old bed bug bites, healing   Stroke [facility])    Tobacco abuse    Typical atrial flutter ([place])    a. 12/2019 s/p RFCA.    Medications:  PTA: Eliquis 64m BID (last dose 2/11 at 0700)  Inpatient: Heparin infusion (2/11 >>) Allergies: NO AC/APT related allergies  Assessment: 52year old female with history of atrial fibirillation on eliquis, presents to ED with complaints of chest pain. Pharmacy consulted for management of heparin infusion in the setting of suspected NSTEMI.   Date Time aPTT/HL Rate/Comment 2/12 0010 aPTT 124 Supratherapeutic at 900 un/hr 2/12` 0746 aPTT 63 Subtherapeutic at 750un/hr 2/13     0132   aPTT 65, HL >1.10  Subtherapeutic 800 un/hr 2/13 0911 aPTT 87 Therapeutic x 1 @ 900 un/hr  Goal of Therapy:  Heparin level 0.3-0.7 units/ml aPTT 66-102 seconds Monitor platelets by anticoagulation protocol: Yes  Plan:  Continue heparin infusion at 900 units/hr. Repeat aPTT in 6 hrs to confirm therapeutic rate. Titrate by aPTT's until lab correlation is noted, then titrate by anti-xa alone. Continue to monitor H&H and platelets daily while on heparin gtt.      Tiran Sauseda Rodriguez-Guzman PharmD, BCPS 10/31/2022 11:42 AM

## 2022-10-31 NOTE — Discharge Instructions (Signed)
Advised to follow-up with cardiology as scheduled. Advised to follow-up with genetic counseling at [facility] for HCM.

## 2022-10-31 NOTE — Progress Notes (Signed)
  Transition of Care [facility]) Screening Note   Patient Details  Name: Hannah Zimmerman Date of Birth: September 04, 1971   Transition of Care [facility] CM/SW Contact:    Tiburcio Bash, LCSW Phone Number: 10/31/2022, 1:27 PM    Transition of Care Department [facility]) has reviewed patient and no TOC needs have been identified at this time. We will continue to monitor patient advancement through interdisciplinary progression rounds. If new patient transition needs arise, please place a TOC consult.  Kelby Fam, [place], MSW, [place]

## 2022-10-31 NOTE — Plan of Care (Signed)

## 2022-10-31 NOTE — Discharge Summary (Signed)
Physician Discharge Summary  Hannah Zimmerman [id number] DOB: 01/07/1971 DOA: 10/29/2022  PCP: Donnamarie Rossetti, PA-C  Admit date: 10/29/2022  Discharge date: 10/31/2022  Admitted From: Home  Disposition:  Home  Recommendations for Outpatient Follow-up:  Follow up with PCP in 1-2 weeks. Please obtain BMP/CBC in one week Advised to follow-up with cardiology as scheduled. Advised to follow-up with genetic [facility] for HCMCyndie Mull, [place])  Home Health:None Equipment/Devices:None  Discharge Condition: Stable CODE STATUS:Full code Diet recommendation: Heart Healthy   Brief [place] Hospital Course: This 52 years old female with PMH significant for A-fib on Eliquis (s/p maze) hyperlipidemia, stroke, diastolic CHF, pulmonary hypertension, CAD, depression, s/p sleeve gastrectomy presented in the ED with complaints of chest pain. She describes chest pain on the left side under her breast associated with some shortness of breath,  4 /10 on pain intensity radiating towards both shoulders and neck. She denies any recent travel, She was given aspirin 325 on arrival. She was found to have troponin 41.  EKG shows diffuse T wave inversion in lateral leads V4 to V6 and inferior leads.  Patient was admitted for further evaluation. Cardiology was consulted.  Patient underwent LHC on 10/30/22 showed mild RCA and distal RCA lesion that was only 20% stenosis.  Patient remained chest free.  Heparin was discontinued and restarted on Eliquis.   There is no plan for pacemaker insertion at this point.  Cardiology recommended patient can be discharged home,  if she feels better post cath.  Patient was evaluated by electrophysiologist , advised she will need to wear a Holter monitor on discharge and have genetic testing completed at [facility] for HOCM.  Patient wants to be discharged and Patient is being discharged home.   Discharge Diagnoses:  Principal Problem:   NSTEMI (non-ST elevated myocardial  infarction) ([place]) Active Problems:   Chronic diastolic CHF (congestive heart failure) (HCC)   Atrial fibrillation, chronic (HCC)   HLD (hyperlipidemia)   Depression   Stroke [facility])  NSTEMI: Patient presented with chest pain under left breast, troponin 41.   EKG shows diffuse T wave inversion in lateral leads V4 to V5 in inferior leads. Patient started on IV heparin and Eliquis was kept on hold.   Troponin 41> 40> 44> 37>37 Continue morphine as needed,  nitroglycerin for pain control. Aspirin 81 mg daily and pravastatin Risk factor modification.   Cardiology is consulted.  She underwent LHC on 10/30/2022 showed mild RCA and distal RCA lesion that was only 20% stenosis.  Patient remained chest free. Heparin discontinued resumed on Eliquis.  Cardiology signed off.   Patient is being discharged home.   Chronic diastolic CHF: Recent echo shows LVEF 60 to 65%.   Patient has trace leg edema, no JVD.  No shortness of breath.  Seems CHF is compensated. Continue home Lasix 40 mg daily   Chronic atrial fibrillation: Heart rate is well-controlled.   Continue Eliquis. Hesitant to place her on beta-blockers given history of junctional bradycardia as well as documented 2.4-second pause on telemetry.   Hyperlipidemia : Continue pravastatin   Depression: Continue Requip   History of stroke: Continue pravastatin and patient was on Eliquis at home.    Discharge Instructions  Discharge Instructions     AMB referral to Phase II Cardiac Rehabilitation   Complete by: As directed    Diagnosis: NSTEMI   After initial evaluation and assessments completed: Virtual Based Care may be provided alone or in conjunction with Phase 2 Cardiac Rehab based on  patient barriers.: Yes   Intensive Cardiac Rehabilitation (ICR) [place] location only OR Traditional Cardiac Rehabilitation (TCR) *If criteria for ICR are not met will enroll in TCR [facility] only): Yes   Call MD for:  difficulty breathing, headache or visual  disturbances   Complete by: As directed    Call MD for:  persistant dizziness or light-headedness   Complete by: As directed    Call MD for:  persistant nausea and vomiting   Complete by: As directed    Diet - low sodium heart healthy   Complete by: As directed    Diet Carb Modified   Complete by: As directed    Discharge instructions   Complete by: As directed    Advised to follow-up with cardiology as scheduled. Advised to follow-up with genetic counseling at [facility] for HCM.   Increase activity slowly   Complete by: As directed       Allergies as of 10/31/2022       Reactions   Atorvastatin Nausea Only   Codeine Hives      Hydrocodone Hives        Medication List     STOP taking these medications    acetaminophen 500 MG tablet Commonly known as: TYLENOL       TAKE these medications    apixaban 5 MG Tabs tablet Commonly known as: Eliquis TAKE 1 TABLET(5 MG) BY MOUTH TWICE DAILY   aspirin EC 81 MG tablet Take 1 tablet (81 mg total) by mouth daily. Swallow whole. Start taking on: November 01, 2022   b complex vitamins capsule Take 1 capsule by mouth daily.   cyclobenzaprine 10 MG tablet Commonly known as: FLEXERIL Take 10 mg by mouth 2 (two) times daily as needed for muscle spasms. 1/2 - 1 po bid prn   furosemide 40 MG tablet Commonly known as: LASIX Take 1 tablet (40 mg total) by mouth daily. Start taking on: November 01, 2022 What changed:  how much to take how to take this when to take this additional instructions   midodrine 5 MG tablet Commonly known as: PROAMATINE Take 3 tablets (15 mg total) by mouth 2 (two) times daily with a meal.   pantoprazole 20 MG tablet Commonly known as: PROTONIX Take 20 mg by mouth daily.   potassium chloride SA 20 MEQ tablet Commonly known as: Klor-Con M20 Take 1 tablet once daily, with Lasix, for 7 days   pravastatin 20 MG tablet Commonly known as: PRAVACHOL Take 20 mg by mouth daily.   rOPINIRole 0.5  MG tablet Commonly known as: REQUIP Take 0.5 mg by mouth 3 (three) times daily.   VITAMIN D3 PO Take 1 tablet by mouth in the morning.        Follow-up Information     Grayland Ormond Hestle, PA-C Follow up in 1 week(s).   Specialty: Family Medicine Contact information: [place] [address] [phone]         Deboraha Sprang, MD. Call in 1 week(s).   Specialty: Cardiology Why: Appointment on Monday, 11/06/2022 at 8:40am. Contact information: [address] [place] [address] [phone]                Allergies  Allergen Reactions   Atorvastatin Nausea Only   Codeine Hives        Hydrocodone Hives    Consultations: Cardiology   Procedures/Studies: CARDIAC CATHETERIZATION  Result Date: 10/30/2022   Mid RCA to Dist RCA lesion is  20% stenosed. 1. mild nonobstructive coronary artery disease. 2.  Left ventricular angiography was not performed.  EF was normal by echo. 3.  Typical spike and dome appearance of left ventricular tracing consistent with hypertrophic cardiomyopathy.  Mild resting unprovoked LVOT obstruction with peak systolic gradient of 26 mmHg. Recommendations: Suspect that elevated troponin is likely due to supply demand mismatch in the setting of hypertrophic cardiomyopathy likely with underlying uncontrolled tachycardia.  I am hesitant to add a beta-blocker given history of junctional bradycardia as well as documented 2.4-second pauses on telemetry.  I consulted Dr. 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    ECHOCARDIOGRAM COMPLETE  Result Date: 10/30/2022    ECHOCARDIOGRAM REPORT   Patient Name:   WILLADEAN BERBER Date of Exam: 10/30/2022 Medical Rec #:  [id number]        Height:       64.0 in Accession #:    [id number]       Weight:       137.5 lb Date of Birth:  1971-04-11       BSA:          1.668 m Patient Age:    4 years         BP:            101/68 mmHg Patient Gender: F                HR:           100 bpm. Exam Location:  [facility] Procedure: 2D Echo, Color Doppler and Cardiac Doppler Indications:     Acute myocardail infarction I21.9  History:         Patient has prior history of Echocardiogram examinations, most                  recent 11/13/2021. Stroke; Signs/Symptoms:Murmur. Hypertrophic                  cardiomyopathy.  Sonographer:     Sherrie Sport Referring Phys:  [id number] A ARIDA Diagnosing Phys: Kathlyn Sacramento MD IMPRESSIONS  1. Left ventricular ejection fraction, by estimation, is 65 to 70%. The left ventricle has normal function. The left ventricle has no regional wall motion abnormalities. There is moderate asymmetric left ventricular hypertrophy of the septal segment. Left ventricular diastolic parameters are indeterminate. No significant LVOT gradient even with Valsalva.  2. Right ventricular systolic function is normal. The right ventricular size is normal. There is mildly elevated pulmonary artery systolic pressure.  3. Left atrial size was mildly dilated.  4. Right atrial size was mildly dilated.  5. The mitral valve is normal in structure. Mild mitral valve regurgitation. No evidence of mitral stenosis.  6. Tricuspid valve regurgitation is moderate.  7. The aortic valve is normal in structure. Aortic valve regurgitation is not visualized. No aortic stenosis is present.  8. The inferior vena cava is normal in size with <50% respiratory variability, suggesting right atrial pressure of 8 mmHg. FINDINGS  Left Ventricle: Left ventricular ejection fraction, by estimation, is 65 to 70%. The left ventricle has normal function. The left ventricle has no regional wall motion abnormalities. The left ventricular internal cavity size was normal in size. There is  moderate asymmetric left ventricular hypertrophy of the septal segment. Left ventricular diastolic parameters are indeterminate. Right Ventricle: The right ventricular size is normal. No  increase in right ventricular wall thickness. Right ventricular systolic function is normal. There is mildly  elevated pulmonary artery systolic pressure. The tricuspid regurgitant velocity is 2.78  m/s, and with an assumed right atrial pressure of 8 mmHg, the estimated right ventricular systolic pressure is [id number]. Left Atrium: Left atrial size was mildly dilated. Right Atrium: Right atrial size was mildly dilated. Pericardium: There is no evidence of pericardial effusion. Mitral Valve: The mitral valve is normal in structure. Mild mitral valve regurgitation. No evidence of mitral valve stenosis. Tricuspid Valve: The tricuspid valve is normal in structure. Tricuspid valve regurgitation is moderate . No evidence of tricuspid stenosis. Aortic Valve: The aortic valve is normal in structure. Aortic valve regurgitation is not visualized. No aortic stenosis is present. Aortic valve mean gradient measures 3.0 mmHg. Aortic valve peak gradient measures 5.5 mmHg. Aortic valve area, by VTI measures 3.36 cm. Pulmonic Valve: The pulmonic valve was normal in structure. Pulmonic valve regurgitation is not visualized. No evidence of pulmonic stenosis. Aorta: The aortic root is normal in size and structure. Venous: The inferior vena cava is normal in size with less than 50% respiratory variability, suggesting right atrial pressure of 8 mmHg. IAS/Shunts: No atrial level shunt detected by color flow Doppler.  LEFT VENTRICLE PLAX 2D LVIDd:         3.80 cm LVIDs:         2.40 cm LV PW:         1.50 cm LV IVS:        1.50 cm LVOT diam:     2.00 cm LV SV:         53 LV SV Index:   32 LVOT Area:     3.14 cm  RIGHT VENTRICLE RV S prime:     11.00 cm/s TAPSE (M-mode): 1.2 cm LEFT ATRIUM             Index        RIGHT ATRIUM           Index LA diam:        4.00 cm 2.40 cm/m   RA Area:     21.30 cm LA Vol (A2C):   95.2 ml 57.06 ml/m  RA Volume:   65.10 ml  39.02 ml/m LA Vol (A4C):   71.5 ml 42.86 ml/m LA Biplane Vol: 82.8 ml 49.63  ml/m  AORTIC VALVE AV Area (Vmax):    2.68 cm AV Area (Vmean):   2.72 cm AV Area (VTI):     3.36 cm AV Vmax:           117.00 cm/s AV Vmean:          80.400 cm/s AV VTI:            0.158 m AV Peak Grad:      5.5 mmHg AV Mean Grad:      3.0 mmHg LVOT Vmax:         99.80 cm/s LVOT Vmean:        69.700 cm/s LVOT VTI:          0.169 m LVOT/AV VTI ratio: 1.07  AORTA Ao Root diam: 2.80 cm MITRAL VALVE               TRICUSPID VALVE MV Area (PHT): 3.56 cm    TR Peak grad:   30.9 mmHg MV Decel Time: 213 msec    TR Vmax:        278.00 cm/s MV E velocity: 76.50 cm/s  SHUNTS                            Systemic VTI:  0.17 m                            Systemic Diam: 2.00 cm Kathlyn Sacramento MD Electronically signed by Kathlyn Sacramento MD Signature Date/Time: 10/30/2022/12:30:41 PM    Final    DG Chest 2 View  Result Date: 10/29/2022 CLINICAL DATA:  Chest tightness EXAM: CHEST - 2 VIEW COMPARISON:  A 1523 FINDINGS: No focal consolidation. No pleural effusion or pneumothorax. Heart and mediastinal contours are stable. Left atrial appendage clipping. Prior median sternotomy. No acute osseous abnormality. IMPRESSION: No active cardiopulmonary disease. Electronically Signed   By: Kathreen Devoid M.D.   On: 10/29/2022 15:35     Subjective: Patient was seen and examined at bedside.  Overnight events noted.  Patient report doing much better.  Denies any chest pain and wants to be discharged.  Patient being discharged home.  Discharge Exam: Vitals:   10/31/22 0903 10/31/22 1209  BP: (!) 126/93 (!) 125/97  Pulse: 87 83  Resp: 16 18  Temp: 98.2 F (36.8 C) 98 F (36.7 C)  SpO2: 100% 100%   Vitals:   10/31/22 0800 10/31/22 0855 10/31/22 0903 10/31/22 1209  BP: 110/79  (!) 126/93 (!) 125/97  Pulse:   87 83  Resp: 16  16 18  $ Temp: 97.9 F (36.6 C)  98.2 F (36.8 C) 98 F (36.7 C)  TempSrc: Oral  Oral   SpO2:   100% 100%  Weight:  60.6 kg    Height:        General: Pt is alert, awake,  not in acute distress Cardiovascular: RRR, S1/S2 +, no rubs, no gallops Respiratory: CTA bilaterally, no wheezing, no rhonchi Abdominal: Soft, NT, ND, bowel sounds + Extremities: no edema, no cyanosis    The results of significant diagnostics from this hospitalization (including imaging, microbiology, ancillary and laboratory) are listed below for reference.     Microbiology: No results found for this or any previous visit (from the past 240 hour(s)).   Labs: BNP (last 3 results) Recent Labs    03/17/22 1136 03/24/22 1008 10/29/22 1622  BNP 1,144.1* 877.4* [id number]*   Basic Metabolic Panel: Recent Labs  Lab 10/25/22 0831 10/29/22 1514 10/30/22 0421 10/31/22 0132  NA 142 141 139 140  K 3.9 3.5 3.2* 3.2*  CL 106 107 108 106  CO2 21 23 23 23  $ GLUCOSE 85 90 85 90  BUN 14 19 20 20  $ CREATININE 0.80 0.92 0.89 0.93  CALCIUM 9.5 9.4 8.7* 9.1  MG  --   --   --  1.9  PHOS  --   --   --  4.2   Liver Function Tests: No results for input(s): "AST", "ALT", "ALKPHOS", "BILITOT", "PROT", "ALBUMIN" in the last 168 hours. No results for input(s): "LIPASE", "AMYLASE" in the last 168 hours. No results for input(s): "AMMONIA" in the last 168 hours. CBC: Recent Labs  Lab 10/25/22 0831 10/29/22 1514 10/30/22 0421 10/31/22 0132  WBC 7.2 8.5 8.1 4.7  HGB 11.5 11.9* 11.3* 12.0  HCT 36.4 36.6 35.7* 37.3  MCV 91 89.7 90.2 89.4  PLT 365 340 283 269   Cardiac Enzymes: No results for input(s): "CKTOTAL", "CKMB", "CKMBINDEX", "TROPONINI" in the last 168 hours. BNP: Invalid input(s): "POCBNP" CBG: No results  for input(s): "GLUCAP" in the last 168 hours. D-Dimer No results for input(s): "DDIMER" in the last 72 hours. Hgb A1c Recent Labs    10/30/22 0421  HGBA1C 6.1*   Lipid Profile Recent Labs    10/29/22 1639  CHOL 153  HDL 71  LDLCALC 75  TRIG 35  CHOLHDL 2.2   Thyroid function studies No results for input(s): "TSH", "T4TOTAL", "T3FREE", "THYROIDAB" in the last 72  hours.  Invalid input(s): "FREET3" Anemia work up No results for input(s): "VITAMINB12", "FOLATE", "FERRITIN", "TIBC", "IRON", "RETICCTPCT" in the last 72 hours. Urinalysis    Component Value Date/Time   COLORURINE AMBER (A) 03/02/2022 1150   APPEARANCEUR CLOUDY (A) 03/02/2022 1150   LABSPEC 1.029 03/02/2022 1150   PHURINE 5.0 03/02/2022 1150   GLUCOSEU NEGATIVE 03/02/2022 1150   HGBUR NEGATIVE 03/02/2022 1150   BILIRUBINUR NEGATIVE 03/02/2022 1150   KETONESUR 5 (A) 03/02/2022 1150   PROTEINUR 30 (A) 03/02/2022 1150   NITRITE NEGATIVE 03/02/2022 1150   LEUKOCYTESUR NEGATIVE 03/02/2022 1150   Sepsis Labs Recent Labs  Lab 10/25/22 0831 10/29/22 1514 10/30/22 0421 10/31/22 0132  WBC 7.2 8.5 8.1 4.7   Microbiology No results found for this or any previous visit (from the past 240 hour(s)).   Time coordinating discharge: Over 30 minutes  SIGNED:   Shawna Clamp, MD  [facility] 10/31/2022, 2:51 PM Pager   If 7PM-7AM, please contact night-coverage

## 2022-10-31 NOTE — Plan of Care (Signed)
Problem: Education: Goal: Knowledge of General Education information will improve Description: Including pain rating scale, medication(s)/side effects and non-pharmacologic comfort measures 10/31/2022 1343 by Shauna Hugh, RN Outcome: Adequate for Discharge 10/31/2022 0807 by Shauna Hugh, RN Outcome: Progressing   Problem: Health Behavior/Discharge Planning: Goal: Ability to manage health-related needs will improve 10/31/2022 1343 by Shauna Hugh, RN Outcome: Adequate for Discharge 10/31/2022 0807 by Shauna Hugh, RN Outcome: Progressing   Problem: Clinical Measurements: Goal: Ability to maintain clinical measurements within normal limits will improve 10/31/2022 1343 by Shauna Hugh, RN Outcome: Adequate for Discharge 10/31/2022 0807 by Shauna Hugh, RN Outcome: Progressing Goal: Will remain free from infection 10/31/2022 1343 by Shauna Hugh, RN Outcome: Adequate for Discharge 10/31/2022 0807 by Shauna Hugh, RN Outcome: Progressing Goal: Diagnostic test results will improve 10/31/2022 1343 by Shauna Hugh, RN Outcome: Adequate for Discharge 10/31/2022 0807 by Shauna Hugh, RN Outcome: Progressing Goal: Respiratory complications will improve 10/31/2022 1343 by Shauna Hugh, RN Outcome: Adequate for Discharge 10/31/2022 0807 by Shauna Hugh, RN Outcome: Progressing Goal: Cardiovascular complication will be avoided 10/31/2022 1343 by Shauna Hugh, RN Outcome: Adequate for Discharge 10/31/2022 0807 by Shauna Hugh, RN Outcome: Progressing   Problem: Activity: Goal: Risk for activity intolerance will decrease 10/31/2022 1343 by Shauna Hugh, RN Outcome: Adequate for Discharge 10/31/2022 [phone] by Shauna Hugh, RN Outcome: Progressing   Problem: Nutrition: Goal: Adequate nutrition will be maintained 10/31/2022 1343 by Shauna Hugh, RN Outcome: Adequate for Discharge 10/31/2022 0807 by Shauna Hugh, RN Outcome: Progressing   Problem: Coping: Goal: Level of anxiety  will decrease 10/31/2022 1343 by Shauna Hugh, RN Outcome: Adequate for Discharge 10/31/2022 0807 by Shauna Hugh, RN Outcome: Progressing   Problem: Elimination: Goal: Will not experience complications related to bowel motility 10/31/2022 1343 by Shauna Hugh, RN Outcome: Adequate for Discharge 10/31/2022 0807 by Shauna Hugh, RN Outcome: Progressing Goal: Will not experience complications related to urinary retention 10/31/2022 1343 by Shauna Hugh, RN Outcome: Adequate for Discharge 10/31/2022 0807 by Shauna Hugh, RN Outcome: Progressing   Problem: Pain Managment: Goal: General experience of comfort will improve 10/31/2022 1343 by Shauna Hugh, RN Outcome: Adequate for Discharge 10/31/2022 0807 by Shauna Hugh, RN Outcome: Progressing   Problem: Safety: Goal: Ability to remain free from injury will improve 10/31/2022 1343 by Shauna Hugh, RN Outcome: Adequate for Discharge 10/31/2022 0807 by Shauna Hugh, RN Outcome: Progressing   Problem: Skin Integrity: Goal: Risk for impaired skin integrity will decrease 10/31/2022 1343 by Shauna Hugh, RN Outcome: Adequate for Discharge 10/31/2022 0807 by Shauna Hugh, RN Outcome: Progressing   Problem: Education: Goal: Understanding of CV disease, CV risk reduction, and recovery process will improve 10/31/2022 1343 by Shauna Hugh, RN Outcome: Adequate for Discharge 10/31/2022 0807 by Shauna Hugh, RN Outcome: Progressing Goal: Individualized Educational Video(s) 10/31/2022 1343 by Shauna Hugh, RN Outcome: Adequate for Discharge 10/31/2022 0807 by Shauna Hugh, RN Outcome: Progressing   Problem: Activity: Goal: Ability to return to baseline activity level will improve 10/31/2022 1343 by Shauna Hugh, RN Outcome: Adequate for Discharge 10/31/2022 [phone] by Shauna Hugh, RN Outcome: Progressing   Problem: Cardiovascular: Goal: Ability to achieve and maintain adequate cardiovascular perfusion will improve 10/31/2022  1343 by Shauna Hugh, RN Outcome: Adequate for Discharge 10/31/2022 0807 by Shauna Hugh, RN Outcome: Progressing Goal: Vascular access site(s) Level 0-1 will be maintained 10/31/2022 1343 by Shauna Hugh, RN Outcome: Adequate for Discharge 10/31/2022 0807 by Shauna Hugh, RN Outcome: Progressing   Problem: Health Behavior/Discharge Planning: Goal: Ability to safely manage health-related needs after  discharge will improve 10/31/2022 1343 by Shauna Hugh, RN Outcome: Adequate for Discharge 10/31/2022 [phone] by Shauna Hugh, RN Outcome: Progressing

## 2022-10-31 NOTE — Consult Note (Signed)
ANTICOAGULATION CONSULT NOTE  Pharmacy Consult for heparin infusion Indication: chest pain/ACS  Allergies  Allergen Reactions   Atorvastatin Nausea Only   Codeine Hives        Hydrocodone Hives    Patient Measurements: Height: 5' 4"$  (162.6 cm) Weight: 62.4 kg (137 lb 8 oz) IBW/kg (Calculated) : 54.7 Heparin Dosing Weight: 64.5 kg  Vital Signs: Temp: 98.4 F (36.9 C) (02/13 0023) Temp Source: Oral (02/13 0023) BP: 115/60 (02/13 0023) Pulse Rate: 58 (02/13 0023)  Labs: Recent Labs    10/29/22 1514 10/29/22 1639 10/29/22 1918 10/30/22 0010 10/30/22 0421 10/30/22 0746 10/31/22 0132  HGB 11.9*  --   --   --  11.3*  --  12.0  HCT 36.6  --   --   --  35.7*  --  37.3  PLT 340  --   --   --  283  --  269  APTT  --   --   --  124*  --  63* 65*  LABPROT  --   --   --  15.8*  --   --   --   INR  --   --   --  1.3*  --   --   --   HEPARINUNFRC  --   --   --   --   --  >1.10* >1.10*  CREATININE 0.92  --   --   --  0.89  --  0.93  TROPONINIHS 41*   < > 44* 37* 37*  --   --    < > = values in this interval not displayed.     Estimated Creatinine Clearance: 61.8 mL/min (by C-G formula based on SCr of 0.93 mg/dL).   Medical History: Past Medical History:  Diagnosis Date   (HFpEF) heart failure with preserved ejection fraction ([place])    a. 07/2019 Echo: EF 60-65%, mod LVH. Sev dil LA, mildly dil RA. Mod elev PASP; b. 07/2020 TEE: EF 50-55%, no rwma, sev conc LVH. Nl RV fxn. Mod dil RA. Mild-mod MR, mod TR.   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 07/2020 Cardiac CT: Cor Ca2+ = 338 (99th %'ile).   Complication of anesthesia 20 years ago   a. spinal with first c section went too high stopped breathing, low bp with 2nd c section, no further issues with anesthesia   Heart murmur    a. 07/2019 Echo: no significant valvular dzs; b. 07/2020 TEE: Mild-mod MR, mod TR.   History of hiatal hernia    Hypertrophic cardiomyopathy ([place])    a. 07/2019 Echo: Mod  LVH; b. 01/2020 cMRI: EF 67%, HCM w/o obstruction. Max wall thickness 64m. Sev LAE w/ L->R atrial level shunt. Large area of LGE @ mid-ventricular level in area of max wall thickness; c. 07/2020 Cardiac CT: Asymm hypertrophy up to 161min mid inferoseptum consistent w/ known HCM.   Morbid obesity ([place]   a. 09/2014 s/p gastric bypass.   Persistent atrial fibrillation ([place]   a. Dx 07/2019-->CHA2DS2VASc = 2 (diast CHF/Fem)-->Eliquis; b. 09/2018 s/p DCCV (150J (biphasic) x 1); c. 12/2019 RFCA/PVI; d. 07/2020 repeat RFCA/PVI.   Rash    both arms from old bed bug bites, healing   [facility]   Tobacco abuse    Typical atrial flutter (HCCle[place]   a. 12/2019 s/p RFCA.    Medications:  PTA: Eliquis 9m69mID (last dose 2/11 at 0700)  Inpatient: Heparin  infusion (2/11 >>) Allergies: NO AC/APT related allergies  Assessment: 52 year old female with history of atrial fibirillation on eliquis, presents to ED with complaints of chest pain. Pharmacy consulted for management of heparin infusion in the setting of suspected NSTEMI.   Date Time aPTT/HL Rate/Comment 2/12 0010 aPTT 124 Supratherapeutic at 900 un/hr 2/12` 0746 aPTT 63 Subtherapeutic at 750un/hr 2/13     0132   aPTT 65, HL >1.10  Subtherapeutic 800 un/hr  Goal of Therapy:  Heparin level 0.3-0.7 units/ml aPTT 66-102 seconds Monitor platelets by anticoagulation protocol: Yes   Plan:  2/13 @ 0132:  HL = > 1.10,   aPTT = 65 aPTT is SUBtherapeutic but HL remains elevated from Eliquis PTA. Will order heparin 950 units IV X 1 bolus and increase drip rate to 900 units/hr. Will recheck aPTT 6 hrs after rate change. Will recheck HL on 2/14 with AM labs.  --Titrate by aPTT's until lab correlation is noted, then titrate by anti-xa alone. --Continue to monitor H&H and platelets daily while on heparin gtt.      Tollie Eth III, PharmD 10/31/2022 2:06 AM

## 2022-11-03 DIAGNOSIS — I4729 Other ventricular tachycardia: Secondary | ICD-10-CM | POA: Insufficient documentation

## 2022-11-03 DIAGNOSIS — I422 Other hypertrophic cardiomyopathy: Secondary | ICD-10-CM | POA: Insufficient documentation

## 2022-11-03 NOTE — Progress Notes (Unsigned)
Cardiology Office Note Date:  11/06/2022  Patient ID:  Hannah Zimmerman, DOB 1971/08/28, MRN [id number] PCP:  Donnamarie Rossetti, PA-C  Cardiologist:  [name], MD Electrophysiologist: Will Meredith Leeds, MD    Chief Complaint: post-hosp follow-up  History of Present Illness: Hannah Zimmerman is a 52 y.o. female with PMH notable for parox Afib (s/p ablation x2, maze), HFpEF, HOCM, NSVT, stroke, tobacco abuse; seen today for Will Meredith Leeds, MD for post hospital follow up.    Admitted 2/12-13 at [facility]. Presented with chest discomfort, feeling like her heart is beating fast and slow. Echo and LHC relatively normal except for known HOCM. Dr. Caryl Comes consulted while in-hospital and rec: - maintaining lytes, ambulatory monitor to assess palpitations and tachy-brady (+/- PPM);  - compression and liberalize salt/water intake for dysuatonomia;  - consider ICD for primary prevention given NSVT, HCM.  Since discharge from hospital the patient reports doing a little better. She continues to feel her AF episodes, feels palpitations and her heart racing at times sometimes with chest pressure.  She continues to have presyncope multiple times a day, no syncope. Has tried to liberalize fluid intake - prefers lemon water over gatorade. She sometimes wears compression socks. She was discharged from [facility] on 76mdaily lasix. Dry weight is 133/135lbs. She was 133lb this AM on home scale. She had lower back/kidney back over the weekend and skipped lasix on Saturday, and only took 259myesterday.   Diligently takes eliquis BID, no bleeding concerns.   Her daughter joins her today in clinic. Has 2 daughters.    she denies chest pain, palpitations, dyspnea, PND, orthopnea, nausea, vomiting, dizziness, syncope, edema, weight gain, or early satiety.     AAD History: Amiodarone - SND in context of CT surgery  Past Medical History:  Diagnosis Date   (HFpEF) heart failure with preserved ejection  fraction ([place]   a. 07/2019 Echo: EF 60-65%, mod LVH. Sev dil LA, mildly dil RA. Mod elev PASP; b. 07/2020 TEE: EF 50-55%, no rwma, sev conc LVH. Nl RV fxn. Mod dil RA. Mild-mod MR, mod TR.   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 07/2020 Cardiac CT: Cor Ca2+ = 338 (99th %'ile).   Complication of anesthesia 20 years ago   a. spinal with first c section went too high stopped breathing, low bp with 2nd c section, no further issues with anesthesia   Heart murmur    a. 07/2019 Echo: no significant valvular dzs; b. 07/2020 TEE: Mild-mod MR, mod TR.   History of hiatal hernia    Hypertrophic cardiomyopathy (HCD[place]   a. 07/2019 Echo: Mod LVH; b. 01/2020 cMRI: EF 67%, HCM w/o obstruction. Max wall thickness 1916mSev LAE w/ L->R atrial level shunt. Large area of LGE @ mid-ventricular level in area of max wall thickness; c. 07/2020 Cardiac CT: Asymm hypertrophy up to 1m30m mid inferoseptum consistent w/ known HCM.   Morbid obesity (HCC)[place] a. 09/2014 s/p gastric bypass.   Persistent atrial fibrillation (HCC)[place] a. Dx 07/2019-->CHA2DS2VASc = 2 (diast CHF/Fem)-->Eliquis; b. 09/2018 s/p DCCV (150J (biphasic) x 1); c. 12/2019 RFCA/PVI; d. 07/2020 repeat RFCA/PVI.   Rash    both arms from old bed bug bites, healing   [facility] Tobacco abuse    Typical atrial flutter (HCC)[place] a. 12/2019 s/p RFCA.    Past Surgical History:  Procedure Laterality Date  ABDOMINAL HYSTERECTOMY     ANKLE SURGERY     left x 2   ATRIAL FIBRILLATION ABLATION N/A 12/24/2019   Procedure: ATRIAL FIBRILLATION ABLATION;  Surgeon: Constance Haw, MD;  Location: [place] [facility];  Service: Cardiovascular;  Laterality: N/A;   ATRIAL FIBRILLATION ABLATION N/A 08/11/2020   Procedure: ATRIAL FIBRILLATION ABLATION;  Surgeon: Constance Haw, MD;  Location: [place] [facility];  Service: Cardiovascular;  Laterality: N/A;   CARDIOVERSION N/A 09/24/2019   Procedure: CARDIOVERSION;  Surgeon:  Minna Merritts, MD;  Location: [place] [facility];  Service: Cardiovascular;  Laterality: N/A;   CARDIOVERSION N/A 10/17/2019   Procedure: CARDIOVERSION;  Surgeon: Minna Merritts, MD;  Location: [facility];  Service: Cardiovascular;  Laterality: N/A;   CARDIOVERSION N/A 11/08/2021   Procedure: CARDIOVERSION;  Surgeon: Minna Merritts, MD;  Location: [facility];  Service: Cardiovascular;  Laterality: N/A;   CESAREAN SECTION     x 3   CLIPPING OF ATRIAL APPENDAGE N/A 03/06/2022   Procedure: CLIPPING OF ATRIAL APPENDAGE USING ATRICURE  CLIP SIZE 45MM;  Surgeon: Melrose Nakayama, MD;  Location: [place];  Service: Open Heart Surgery;  Laterality: N/A;   EXCISION OF ATRIAL MYXOMA N/A 03/06/2022   Procedure: RESECTION OF AORTIC VALVE TUMOR;  Surgeon: Melrose Nakayama, MD;  Location: [place];  Service: Open Heart Surgery;  Laterality: N/A;   HERNIA REPAIR     x2 one was for sure a hiatal hernia   IR ANGIO EXTRACRAN SEL COM CAROTID INNOMINATE UNI L MOD SED  11/13/2021   IR CT HEAD LTD  11/13/2021   IR PERCUTANEOUS ART THROMBECTOMY/INFUSION INTRACRANIAL INC DIAG ANGIO  11/13/2021   IR THORACENTESIS ASP PLEURAL SPACE W/IMG GUIDE  03/14/2022   LAPAROSCOPIC GASTRIC SLEEVE RESECTION  2020   LAPAROSCOPIC GASTRIC SLEEVE RESECTION WITH HIATAL HERNIA REPAIR  08/17/2015   Procedure: LAPAROSCOPIC GASTRIC SLEEVE RESECTION WITH HIATAL HERNIA REPAIR;  Surgeon: Johnathan Hausen, MD;  Location: [facility];  Service: General;;   LEFT HEART CATH AND CORONARY ANGIOGRAPHY N/A 10/30/2022   Procedure: LEFT HEART CATH AND CORONARY ANGIOGRAPHY;  Surgeon: Wellington Hampshire, MD;  Location: [place] CV [facility];  Service: Cardiovascular;  Laterality: N/A;   MAZE N/A 03/06/2022   Procedure: MAZE;  Surgeon: Melrose Nakayama, MD;  Location: [place];  Service: Open Heart Surgery;  Laterality: N/A;   RADIOLOGY WITH ANESTHESIA N/A 11/12/2021   Procedure: IR WITH ANESTHESIA;  Surgeon: Luanne Bras, MD;  Location: [place];  Service:  Radiology;  Laterality: N/A;   TEE WITHOUT CARDIOVERSION N/A 08/11/2020   Procedure: TRANSESOPHAGEAL ECHOCARDIOGRAM (TEE);  Surgeon: Constance Haw, MD;  Location: [place] [facility];  Service: Cardiovascular;  Laterality: N/A;   TEE WITHOUT CARDIOVERSION N/A 11/08/2021   Procedure: TRANSESOPHAGEAL ECHOCARDIOGRAM (TEE);  Surgeon: Minna Merritts, MD;  Location: [facility];  Service: Cardiovascular;  Laterality: N/A;   TEE WITHOUT CARDIOVERSION N/A 03/06/2022   Procedure: TRANSESOPHAGEAL ECHOCARDIOGRAM (TEE);  Surgeon: Melrose Nakayama, MD;  Location: [place];  Service: Open Heart Surgery;  Laterality: N/A;   TUBAL LIGATION     x 2   UPPER GI ENDOSCOPY  08/17/2015   Procedure: UPPER GI ENDOSCOPY;  Surgeon: Johnathan Hausen, MD;  Location: [facility];  Service: General;;    Current Outpatient Medications  Medication Instructions   apixaban (ELIQUIS) 5 MG TABS tablet TAKE 1 TABLET(5 MG) BY MOUTH TWICE DAILY   aspirin EC 81 mg, Oral, Daily, Swallow whole.   b complex vitamins  capsule 1 capsule, Oral, Daily   Cholecalciferol (VITAMIN D3 PO) 1 tablet, Oral, Every morning   cyclobenzaprine (FLEXERIL) 10 mg, Oral, 2 times daily PRN, 1/2 - 1 po bid prn   furosemide (LASIX) 20 mg, Oral, Daily, Take an additional 43m tablet as needed   midodrine (PROAMATINE) 15 mg, Oral, 2 times daily with meals   pantoprazole (PROTONIX) 20 mg, Oral, Daily   potassium chloride SA (KLOR-CON M20) 20 MEQ tablet Take 1 tablet once daily, with Lasix, for 7 days   pravastatin (PRAVACHOL) 20 mg, Oral, Daily   rOPINIRole (REQUIP) 0.5 mg, Oral, 3 times daily    Social History:  The patient  reports that she quit smoking about 4 weeks ago. Her smoking use included cigarettes. She has a 7.75 pack-year smoking history. She has been exposed to tobacco smoke. She has never used smokeless tobacco. She reports current alcohol use of about 1.0 - 2.0 standard drink of alcohol per week. She reports that she does not use drugs.    Family History:  The patient's family history includes Diabetes in her mother; Ehlers-Danlos syndrome in her daughter; Familial dysautonomia in her daughter; Heart failure in her mother; Hypertension in her mother.  ROS:  Please see the history of present illness. All other systems are reviewed and otherwise negative.   PHYSICAL EXAM:  VS:  BP 110/80   Pulse 93   Ht 5' 4"$  (1.626 m)   Wt 137 lb (62.1 kg)   SpO2 99%   BMI 23.52 kg/m  BMI: Body mass index is 23.52 kg/m.  GEN- The patient is well appearing, alert and oriented x 3 today.   HEENT: normocephalic, atraumatic; sclera clear, conjunctiva pink; hearing intact; oropharynx clear; neck supple, no JVP Lungs- Clear to ausculation bilaterally, normal work of breathing.  No wheezes, rales, rhonchi Heart- Regular rate and rhythm, no murmurs, rubs or gallops, PMI not laterally displaced GI- soft, non-tender, non-distended, bowel sounds present, no hepatosplenomegaly Extremities- No peripheral edema. no clubbing or cyanosis; DP/PT/radial pulses 2+ bilaterally MS- no significant deformity or atrophy Skin- warm and dry, no rash or lesion Psych- euthymic mood, full affect Neuro- strength and sensation are intact   EKG is not ordered.   Recent Labs: 03/03/2022: ALT 26 08/02/2022: TSH 2.270 10/29/2022: B Natriuretic Peptide 439.5 10/31/2022: BUN 20; Creatinine, Ser 0.93; Hemoglobin 12.0; Magnesium 1.9; Platelets 269; Potassium 3.2; Sodium 140  10/29/2022: Cholesterol 153; HDL 71; LDL Cholesterol 75; Total CHOL/HDL Ratio 2.2; Triglycerides 35; VLDL 7   Estimated Creatinine Clearance: 61.8 mL/min (by C-G formula based on SCr of 0.93 mg/dL).   Wt Readings from Last 3 Encounters:  11/06/22 137 lb (62.1 kg)  10/31/22 133 lb 11.2 oz (60.6 kg)  10/25/22 142 lb 6.4 oz (64.6 kg)     Additional studies reviewed include: Previous EP, cardiology notes.   TTE 10/30/22  1. Left ventricular ejection fraction, by estimation, is 65 to 70%. The  left ventricle has normal function. The left ventricle has no regional wall motion abnormalities. There is moderate asymmetric left ventricular hypertrophy of the septal segment.  Left ventricular diastolic parameters are indeterminate. No significant LVOT gradient even with Valsalva.   2. Right ventricular systolic function is normal. The right ventricular size is normal. There is mildly elevated pulmonary artery systolic pressure.   3. Left atrial size was mildly dilated.   4. Right atrial size was mildly dilated.   5. The mitral valve is normal in structure. Mild mitral valve regurgitation. No evidence of  mitral stenosis.   6. Tricuspid valve regurgitation is moderate.   7. The aortic valve is normal in structure. Aortic valve regurgitation is not visualized. No aortic stenosis is present.   8. The inferior vena cava is normal in size with <50% respiratory variability, suggesting right atrial pressure of 8 mmHg.    LHC 10/30/22   Mid RCA to Dist RCA lesion is 20% stenosed.   1. mild nonobstructive coronary artery disease. 2.  Left ventricular angiography was not performed.  EF was normal by echo. 3.  Typical spike and dome appearance of left ventricular tracing consistent with hypertrophic cardiomyopathy.  Mild resting unprovoked LVOT obstruction with peak systolic gradient of 26 mmHg.   ASSESSMENT AND PLAN:  #) persistent Afib #) Tachy-Brady She is s/p AF ablation x 2 with Dr. Curt Bears Continues to have symptomatic palpitation episodes She had some bradycardia with pauses on tele while in hospital Recommended a 1 week zio to eval AF burden and HR variability with symptoms.    #) Hypercoag d/t AF CHA2DS2-VASc Score = 4 [CHF History: 1, HTN History: 0, Diabetes History: 0, Stroke History: 2, Vascular Disease History: 0, Age Score: 0, Gender Score: 1].  Therefore, the patient's annual risk of stroke is 4.8 %. [place] - eliquis 66m BID, appropriately dosed     #) Chronic diastolic HF #)  HOCM Euvolemic on exam with good EF by recent echo She has been taking 423mlasix daily, but self-lowered to 2037mver the weekend - update BMET today Continue 72m41msix daily Has indication for ICD for primary prevention.  Zio as above will help guide whether ICD only or CRT-D is recommended - close follow-up with Dr. CamnCurt Bears further discussion of device HOCM Genetic testing ordered    Current medicines are reviewed at length with the patient today.   The patient has concerns regarding her medicines.  The following changes were made today:   DECREASE lasix 72mg54mly  Labs/ tests ordered today include:  Orders Placed This Encounter  Procedures   Hypertrophic Cardiomyopathy (COHESION)   Basic metabolic panel   Ambulatory referral to GenetE. Lopez4 DAYS)     Disposition: Follow up with Dr. CamniCurt Bearsn 4 weeks   Signed, SuzanMamie Levers 11/06/22  9:21 AM  Electrophysiology [facility]

## 2022-11-06 ENCOUNTER — Encounter: Payer: Self-pay | Admitting: Student

## 2022-11-06 ENCOUNTER — Ambulatory Visit: Payer: 59 | Attending: Cardiology

## 2022-11-06 ENCOUNTER — Ambulatory Visit: Payer: 59 | Attending: Cardiology | Admitting: Cardiology

## 2022-11-06 VITALS — BP 110/80 | HR 93 | Ht 64.0 in | Wt 137.0 lb

## 2022-11-06 DIAGNOSIS — I422 Other hypertrophic cardiomyopathy: Secondary | ICD-10-CM | POA: Diagnosis not present

## 2022-11-06 DIAGNOSIS — I4819 Other persistent atrial fibrillation: Secondary | ICD-10-CM

## 2022-11-06 DIAGNOSIS — I4729 Other ventricular tachycardia: Secondary | ICD-10-CM | POA: Diagnosis not present

## 2022-11-06 DIAGNOSIS — D6869 Other thrombophilia: Secondary | ICD-10-CM | POA: Diagnosis not present

## 2022-11-06 DIAGNOSIS — I5032 Chronic diastolic (congestive) heart failure: Secondary | ICD-10-CM

## 2022-11-06 DIAGNOSIS — I639 Cerebral infarction, unspecified: Secondary | ICD-10-CM

## 2022-11-06 LAB — BASIC METABOLIC PANEL
BUN/Creatinine Ratio: 18 (ref 9–23)
BUN: 17 mg/dL (ref 6–24)
CO2: 24 mmol/L (ref 20–29)
Calcium: 9.8 mg/dL (ref 8.7–10.2)
Chloride: 100 mmol/L (ref 96–106)
Creatinine, Ser: 0.94 mg/dL (ref 0.57–1.00)
Glucose: 88 mg/dL (ref 70–99)
Potassium: 4.2 mmol/L (ref 3.5–5.2)
Sodium: 138 mmol/L (ref 134–144)
eGFR: 73 mL/min/{1.73_m2} (ref >59)

## 2022-11-06 MED ORDER — FUROSEMIDE 40 MG PO TABS: 20.0000 mg | ORAL_TABLET | Freq: Every day | ORAL | 3 refills | Status: DC

## 2022-11-06 NOTE — Progress Notes (Unsigned)
Enrolled for [employer] to mail a ZIO XT long term holter monitor to the patients address on file.   Dr. Curt Bears to read.

## 2022-11-06 NOTE — Patient Instructions (Addendum)
Medication Instructions:  Your physician has recommended you make the following change in your medication:   DECREASE: Furosemide (lasix) to 63m daily, you may take an additional 299mas needed  *If you need a refill on your cardiac medications before your next appointment, please call your pharmacy*   Lab Work: TODAY: BMET, Genetic Testing labs  If you have labs (blood work) drawn today and your tests are completely normal, you will receive your results only by: MyChart Message (if you have MyChart) OR A paper copy in the mail If you have any lab test that is abnormal or we need to change your treatment, we will call you to review the results.   Follow-Up: [facility].  As part of our continuing mission to provide you with exceptional heart care, we have created designated Provider Care Teams.  These Care Teams include your primary Cardiologist (physician) and Advanced Practice Providers (APPs -  Physician Assistants and Nurse Practitioners) who all work together to provide you with the care you need, when you need it.   Your next appointment:   1 month(s)  Provider:   WiAllegra LaiMD    Other Instructions  ZIBryn GullingLong Term Monitor Instructions  Your physician has requested you wear a ZIO patch monitor for 7 days.  This is a single patch monitor. [employer] supplies one patch monitor per enrollment. Additional stickers are not available. Please do not apply patch if you will be having a Nuclear Stress Test,  Echocardiogram, Cardiac CT, MRI, or Chest Xray during the period you would be wearing the  monitor. The patch cannot be worn during these tests. You cannot remove and re-apply the  ZIO XT patch monitor.  Your ZIO patch monitor will be mailed 3 day USPS to your address on file. It may take 3-5 days  to receive your monitor after you have been enrolled.  Once you have received your monitor, please review the enclosed  instructions. Your monitor  has already been registered assigning a specific monitor serial # to you.  Billing and Patient Assistance Program Information  We have supplied [employer] with any of your insurance information on file for billing purposes. [employer] offers a sliding scale Patient Assistance Program for patients that do not have  insurance, or whose insurance does not completely cover the cost of the ZIO monitor.  You must apply for the Patient Assistance Program to qualify for this discounted rate.  To apply, please call [employer] at [phone] option 4, select option 2, ask to apply for  Patient Assistance Program. IrTheodore Demarkill ask your household income, and how many people  are in your household. They will quote your out-of-pocket cost based on that information.  [employer] will also be able to set up a 1236-monthnterest-free payment plan if needed.  Applying the monitor   Shave hair from upper left chest.  Hold abrader disc by orange tab. Rub abrader in 40 strokes over the upper left chest as  indicated in your monitor instructions.  Clean area with 4 enclosed alcohol pads. Let dry.  Apply patch as indicated in monitor instructions. Patch will be placed under collarbone on left  side of chest with arrow pointing upward.  Rub patch adhesive wings for 2 minutes. Remove white label marked "1". Remove the white  label marked "2". Rub patch adhesive wings for 2 additional minutes.  While looking in a mirror, press and release button in  center of patch. A small green light will  flash 3-4 times. This will be your only indicator that the monitor has been turned on.  Do not shower for the first 24 hours. You may shower after the first 24 hours.  Press the button if you feel a symptom. You will hear a small click. Record Date, Time and  Symptom in the Patient Logbook.  When you are ready to remove the patch, follow instructions on the last 2 pages of Patient  Logbook. Stick patch  monitor onto the last page of Patient Logbook.  Place Patient Logbook in the blue and white box. Use locking tab on box and tape box closed  securely. The blue and white box has prepaid postage on it. Please place it in the mailbox as  soon as possible. Your physician should have your test results approximately 7 days after the  monitor has been mailed back to [facility].  Call [place] at [phone] if you have questions regarding  your ZIO XT patch monitor. Call them immediately if you see an orange light blinking on your  monitor.  If your monitor falls off in less than 4 days, contact our Monitor department at [phone].  If your monitor becomes loose or falls off after 4 days call [employer] at [phone] for  suggestions on securing your monitor

## 2022-11-08 DIAGNOSIS — M5440 Lumbago with sciatica, unspecified side: Secondary | ICD-10-CM | POA: Diagnosis not present

## 2022-11-08 DIAGNOSIS — I4891 Unspecified atrial fibrillation: Secondary | ICD-10-CM | POA: Diagnosis not present

## 2022-11-08 DIAGNOSIS — R69 Illness, unspecified: Secondary | ICD-10-CM | POA: Diagnosis not present

## 2022-11-08 DIAGNOSIS — E876 Hypokalemia: Secondary | ICD-10-CM | POA: Diagnosis not present

## 2022-11-09 DIAGNOSIS — I4729 Other ventricular tachycardia: Secondary | ICD-10-CM | POA: Diagnosis not present

## 2022-11-09 DIAGNOSIS — I4819 Other persistent atrial fibrillation: Secondary | ICD-10-CM | POA: Diagnosis not present

## 2022-11-13 ENCOUNTER — Other Ambulatory Visit: Payer: Commercial Managed Care - HMO

## 2022-11-15 NOTE — Telephone Encounter (Signed)
Thanks! There was no department chosen for the referral so I never received it but I have fixed it.

## 2022-11-22 DIAGNOSIS — E876 Hypokalemia: Secondary | ICD-10-CM | POA: Diagnosis not present

## 2022-11-29 ENCOUNTER — Ambulatory Visit: Payer: 59 | Attending: Genetic Counselor | Admitting: Genetic Counselor

## 2022-11-29 DIAGNOSIS — I422 Other hypertrophic cardiomyopathy: Secondary | ICD-10-CM

## 2022-11-30 ENCOUNTER — Telehealth: Payer: Self-pay | Admitting: Cardiology

## 2022-11-30 DIAGNOSIS — I4819 Other persistent atrial fibrillation: Secondary | ICD-10-CM | POA: Diagnosis not present

## 2022-11-30 NOTE — Telephone Encounter (Signed)
Minette Brine calling to give abnormal zio patch results.   Call transferred to triage

## 2022-11-30 NOTE — Telephone Encounter (Signed)
   Cardiac Monitor Alert  Date of alert:  11/30/2022   Patient Name: Hannah Zimmerman  DOB: 10/15/1970  MRN: [id number]   [place] Cardiologist: [name], MD  [place] EP:  Will Meredith Leeds, MD    Monitor Information: Long Term Monitor [ZioXT]  Reason:  Other persistent atrial Ordering provider:  Jacklynn Lewis   Alert NSR with pauses.  This is the 1st alert for this rhythm.   Next Cardiology Appointment   Date:  12/08/22  Provider:  Curt Bears  The patient was contacted today.  She is symptomatic and reports intermittent palpitations.    Arrhythmia, symptoms and history reviewed with dr. Lovena Le (DOD).   Plan:  No driving until PPM placed. Advised patient to go to ED for any worsening symptoms (palpitations, chest pain, syncope).  Other: Patient verbalized understanding of plan. Forwarded to Dr. Curt Bears and his team.   Joni Reining, RN  11/30/2022 2:08 PM

## 2022-12-01 ENCOUNTER — Ambulatory Visit: Payer: 59 | Admitting: Cardiology

## 2022-12-01 NOTE — Therapy (Signed)
Note opened in error on a canceled appointment. No Charge or visit count associated with this note.   Rivka Barbara PT, DPT

## 2022-12-04 NOTE — Progress Notes (Signed)
Pre Test Genetic Consult  Referring Provider: Mamie Levers, NP  Referral Reason  Mamie Levers is referred for genetic consult and testing of hypertrophic cardiomyopathy.  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 She has undergone ablation twice for her Afib. She was morbidly obese in the past and underwent a gastric bypass in 2016. She has since lost 180 lbs.  She has symptoms of fatigue, heart palpitations, chest heaviness, dizziness for the past 1/5 years and near syncope. She has been diagnosed with dysautonomia. Also reports having fluid retention since 2020.   She underwent a cardiac MRI in 2021 that detected moderate septal hypertrophy of 1.9 cm without obstruction, LVEF of 67% and scar burden exceeding 15%.   Traditional Risk Factors Hannah Zimmerman denies having HTN or aortic valve stenosis that can also lead to cardiac hypertrophy.  Family history  Relation to [place] Pedigree # Current age Heart condition/age of onset Notes  Daughters IV.1, IV.3 51, 23 None EKG normal for both, Echo to be done for older daughter. Younger daughter has hypermobile EDS, dysautonomia, left atrial dilation and easily fatigued.  Son IV.2 25 None Echo/EKG to be done  Grandchildren V.1-V.2 7, 4 None         Father II.5 56 None   Paternal Aunts/uncles II.1-II.4 II.1-Deceased, 78, 82, 71 None II.1- died of cancer @ ?  Paternal grandfather 1.1 Deceased None Died @ 51 from cancer  Paternal grandmother I.2 Deceased None Died @ 62 from ?        Mother II.6 29 CHF @ 59   Maternal aunts, uncles II.7-II.11 II.9-Deceased, 71, 69, 74, 66 None II.9- Died @ 45 from pancreatic cancer  Maternal grandfather I.3 Deceased None Died @ 31 from colon cancer   Maternal grandmother I.4 42 Afib    Genetics Hannah Zimmerman was counseled on the genetics of hypertrophic cardiomyopathy (HCM). I explained to the patient that this is an  autosomal dominant condition with incomplete penetrance i.e. not all individuals harboring the HCM mutation will present clinically with HCM, and age-related penetrance where clinical presentation of HCM increases with advanced age. Variability in clinical expression is also seen in families with HCM with affected family members presenting clinically at different ages and with symptoms ranging from mild to severe.  Since HCM is an autosomal dominant condition, first degree-relatives should seek regular surveillance for HCM.  Her first-degree relatives are her children, siblings and parents. Clinical screening of first-degree relatives involves echocardiogram and EKG at regular intervals, frequency is typically determined by age, with those in their teens undergoing screening every year and those over the age of 103 getting screened every 3-5 years until the age of 39. Patient verbalized understanding of this.  I informed the patient that about 8-10% of HCM patients can have compound and digenic mutations for HCM. Also briefly discussed the inheritance pattern and treatment /management plans for the infiltrative cardiomyopathies that present as HCM phenocopies.   We walked through the process of genetic testing. I explained to the patient that genetic testing is a probabilistic test dependent upon age and severity of presentation, presence of risk factors for HCM and importantly family history of HCM or sudden death in first-degree relatives.   The potential outcomes of genetic testing and subsequent management of at-risk family members were discussed so as to manage expectations-  If a mutation is not identified, then all first-degree relatives should undergo regular screening for HCM. I  emphasized that even if the genetic test is negative, it does not mean that she does not have HCM. A negative test result can be due to limitations of the genetic test. Patient verbalized understanding of this.  There is  also the likelihood of identifying a "Variant of unknown significance". This result means that the variant has not been detected in a statistically significant number of HCM patients and/or functional studies have not been performed to verify its pathogenicity. This VUS can be tested in the family to see if it segregates with disease. If a VUS is found, first-degree relatives should undergo regular clinical screening for HCM.  If a pathogenic variant is reported, then first-degree family members can get tested for this variant. If they test positive, it is likely they will develop HCM. In light of variable expression and incomplete penetrance associated with HCM, it is not possible to predict when they will manifest clinically with HCM. It is recommended that family members that test positive for the familial pathogenic variant pursue clinical screening for HCM. Family members that test negative for the familial mutation need not pursue periodic screening for HCM, but seek care if symptoms develop.   Impression  Hannah Zimmerman presents with HCM at age 52 with asymmetric septal hypertrophy and excessive scar burden (>15%) in the absence of risk factors.  However, she reports no family history of HCM or sudden death. Hence, it is likely that she may have a de novo mutation for HCM or may have inherited this condition from her mother who was diagnosed with CHF at a very young age. HCM being an autosomal dominant condition, her children are at a 50% risk of inheriting HCM.  Genetic testing for HCM will determine if she harbors a pathogenic variant and assess the risk of her children harboring the pathogenic variant. HCM genetic test should include the sarcomeric genes implicated in HCM as well as the genes for the HCM phenocopies, such as Fabry disease, Danon disease, WPW syndrome and familial transthyretin amyloidosis.  In addition, we discussed the protections afforded by the Genetic Information Non-Discrimination Act  (GINA). I explained to the patient that GINA protects them from losing their employment or health insurance based on their genotype. However, these protections do not cover life insurance and disability. Patient verbalized understanding of this and tells me that her two older children do not have life insurance while Hannah Zimmerman has it.  Please note that the patient has not been counseled in this visit on personal, cultural, or ethical issues that they may face due to their heart condition.   Plan After a thorough discussion of the risk and benefits of genetic testing for HCM, Hannah Zimmerman declines genetic testing for HCM due to insurance noncoverage issues as her insurance considers this test as medically unnecessary and investigational. She is aware that her children need regular screening for HCM.   Lattie Corns, Ph.D, [facility] Clinical Molecular Geneticist

## 2022-12-06 ENCOUNTER — Telehealth: Payer: Self-pay | Admitting: Cardiology

## 2022-12-06 ENCOUNTER — Encounter: Payer: Self-pay | Admitting: Cardiology

## 2022-12-06 NOTE — Telephone Encounter (Signed)
Pt c/o Syncope: STAT if syncope occurred within 30 minutes and pt complains of lightheadedness High Priority if episode of passing out, completely, today or in last 24 hours   Did you pass out today? yes   When is the last time you passed out? This morning   Has this occurred multiple times? 1 other time   Did you have any symptoms prior to passing out?   Heart was racing

## 2022-12-06 NOTE — Telephone Encounter (Signed)
Pt called in to report she passed out this morning. Reports her heart went to racing and then she passed out. Pt was standing at the time, heart started to race and she went backwards as she passed out.  Witnessed by her spouse. She did hit her head when she went down. Pt advised to go to ED for evaluation, but she refuses at this time. Pt would like to implant PPM tomorrow, but informed her that there is no sooner time currently. Advised to go to ED if she passes out again, and to let us know if occurs. Aware that I will forward this to MD and call her if he has any other advisement. She is aware not to drive and to take it easy until procedure next Friday.

## 2022-12-08 ENCOUNTER — Ambulatory Visit: Payer: 59 | Attending: Cardiology | Admitting: Cardiology

## 2022-12-08 ENCOUNTER — Encounter: Payer: Self-pay | Admitting: *Deleted

## 2022-12-08 ENCOUNTER — Ambulatory Visit
Admission: RE | Admit: 2022-12-08 | Discharge: 2022-12-08 | Disposition: A | Discharge status: Home / Self Care | Payer: 59 | Source: Ambulatory Visit | Attending: Neurosurgery | Admitting: Neurosurgery

## 2022-12-08 ENCOUNTER — Other Ambulatory Visit: Payer: Commercial Managed Care - HMO

## 2022-12-08 ENCOUNTER — Encounter: Payer: Self-pay | Admitting: Cardiology

## 2022-12-08 VITALS — BP 114/74 | HR 76 | Ht 64.0 in | Wt 147.4 lb

## 2022-12-08 DIAGNOSIS — Z01812 Encounter for preprocedural laboratory examination: Secondary | ICD-10-CM | POA: Diagnosis not present

## 2022-12-08 DIAGNOSIS — I422 Other hypertrophic cardiomyopathy: Secondary | ICD-10-CM

## 2022-12-08 DIAGNOSIS — Z1231 Encounter for screening mammogram for malignant neoplasm of breast: Secondary | ICD-10-CM | POA: Diagnosis not present

## 2022-12-08 DIAGNOSIS — D6869 Other thrombophilia: Secondary | ICD-10-CM

## 2022-12-08 DIAGNOSIS — I10 Essential (primary) hypertension: Secondary | ICD-10-CM

## 2022-12-08 DIAGNOSIS — I4819 Other persistent atrial fibrillation: Secondary | ICD-10-CM

## 2022-12-08 NOTE — Progress Notes (Signed)
Electrophysiology Office Note   Date:  12/08/2022   ID:  Hannah Zimmerman, DOB Jan 17, 1971, MRN [id number]  PCP:  Donnamarie Rossetti, PA-C  Cardiologist:  Rockey Situ Primary Electrophysiologist:  Armya Westerhoff Meredith Leeds, MD    Chief Complaint: AF   History of Present Illness: Hannah Zimmerman is a 52 y.o. female who is being seen today for the evaluation of AF at the request of Donnamarie Rossetti,*. Presenting today for electrophysiology evaluation.  History seen for heart failure preserved ejection fraction, morbid obesity post gastric bypass, persistent atrial fibrillation, tobacco abuse.  She is post atrial fibrillation ablation 12/24/2019 with repeat ablation 08/11/2020.  She presented to [facility] with atrial fibrillation.  She had a TEE and cardioversion.  Post cardioversion she had a cryptogenic stroke.  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  She is fibroblast, surgical, left atrial appendage clipping 03/06/2022.  She had been having episodes of syncope.  She wore a cardiac monitor with significant pauses.  She also has hypertrophic cardiomyopathy with LGE and nonsustained VT on cardiac monitor.  Today, denies symptoms of palpitations, chest pain, shortness of breath, orthopnea, PND, lower extremity edema, claudication, dizziness, presyncope, bleeding, or neurologic sequela. The patient is tolerating medications without difficulties.  She did have an episode of syncope.  She felt palpitations at the time.  She is planned for ICD implant next week.    Past Medical History:  Diagnosis Date   (HFpEF) heart failure with preserved ejection fraction ([place])    a. 07/2019 Echo: EF 60-65%, mod LVH. Sev dil LA, mildly dil RA. Mod elev PASP; b. 07/2020 TEE: EF 50-55%, no rwma, sev conc LVH. Nl RV fxn. Mod dil RA. Mild-mod MR, mod TR.   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 07/2020 Cardiac CT: Cor Ca2+ = 338 (99th %'ile).   Complication of anesthesia 20  years ago   a. spinal with first c section went too high stopped breathing, low bp with 2nd c section, no further issues with anesthesia   Heart murmur    a. 07/2019 Echo: no significant valvular dzs; b. 07/2020 TEE: Mild-mod MR, mod TR.   History of hiatal hernia    Hypertrophic cardiomyopathy ([place])    a. 07/2019 Echo: Mod LVH; b. 01/2020 cMRI: EF 67%, HCM w/o obstruction. Max wall thickness 88mm. Sev LAE w/ L->R atrial level shunt. Large area of LGE @ mid-ventricular level in area of max wall thickness; c. 07/2020 Cardiac CT: Asymm hypertrophy up to 45mm in mid inferoseptum consistent w/ known HCM.   Morbid obesity ([place])    a. 09/2014 s/p gastric bypass.   Persistent atrial fibrillation ([place])    a. Dx 07/2019-->CHA2DS2VASc = 2 (diast CHF/Fem)-->Eliquis; b. 09/2018 s/p DCCV (150J (biphasic) x 1); c. 12/2019 RFCA/PVI; d. 07/2020 repeat RFCA/PVI.   Rash    both arms from old bed bug bites, healing   Stroke [facility])    Tobacco abuse    Typical atrial flutter ([place])    a. 12/2019 s/p RFCA.   Past Surgical History:  Procedure Laterality Date   ABDOMINAL HYSTERECTOMY     ANKLE SURGERY     left x 2   ATRIAL FIBRILLATION ABLATION N/A 12/24/2019   Procedure: ATRIAL FIBRILLATION ABLATION;  Surgeon: Constance Haw, MD;  Location: [place] [facility];  Service: Cardiovascular;  Laterality: N/A;   ATRIAL FIBRILLATION ABLATION N/A 08/11/2020   Procedure: ATRIAL FIBRILLATION ABLATION;  Surgeon: Constance Haw, MD;  Location: [place] [facility];  Service: Cardiovascular;  Laterality: N/A;   CARDIOVERSION N/A 09/24/2019   Procedure: CARDIOVERSION;  Surgeon: Minna Merritts, MD;  Location: [place] [facility];  Service: Cardiovascular;  Laterality: N/A;   CARDIOVERSION N/A 10/17/2019   Procedure: CARDIOVERSION;  Surgeon: Minna Merritts, MD;  Location: [facility];  Service: Cardiovascular;  Laterality: N/A;   CARDIOVERSION N/A 11/08/2021   Procedure: CARDIOVERSION;  Surgeon: Minna Merritts, MD;  Location:  [facility];  Service: Cardiovascular;  Laterality: N/A;   CESAREAN SECTION     x 3   CLIPPING OF ATRIAL APPENDAGE N/A 03/06/2022   Procedure: CLIPPING OF ATRIAL APPENDAGE USING ATRICURE  CLIP SIZE 45MM;  Surgeon: Melrose Nakayama, MD;  Location: [place];  Service: Open Heart Surgery;  Laterality: N/A;   EXCISION OF ATRIAL MYXOMA N/A 03/06/2022   Procedure: RESECTION OF AORTIC VALVE TUMOR;  Surgeon: Melrose Nakayama, MD;  Location: [place];  Service: Open Heart Surgery;  Laterality: N/A;   HERNIA REPAIR     x2 one was for sure a hiatal hernia   IR ANGIO EXTRACRAN SEL COM CAROTID INNOMINATE UNI L MOD SED  11/13/2021   IR CT HEAD LTD  11/13/2021   IR PERCUTANEOUS ART THROMBECTOMY/INFUSION INTRACRANIAL INC DIAG ANGIO  11/13/2021   IR THORACENTESIS ASP PLEURAL SPACE W/IMG GUIDE  03/14/2022   LAPAROSCOPIC GASTRIC SLEEVE RESECTION  2020   LAPAROSCOPIC GASTRIC SLEEVE RESECTION WITH HIATAL HERNIA REPAIR  08/17/2015   Procedure: LAPAROSCOPIC GASTRIC SLEEVE RESECTION WITH HIATAL HERNIA REPAIR;  Surgeon: Johnathan Hausen, MD;  Location: [facility];  Service: General;;   LEFT HEART CATH AND CORONARY ANGIOGRAPHY N/A 10/30/2022   Procedure: LEFT HEART CATH AND CORONARY ANGIOGRAPHY;  Surgeon: Wellington Hampshire, MD;  Location: [place] [facility];  Service: Cardiovascular;  Laterality: N/A;   MAZE N/A 03/06/2022   Procedure: MAZE;  Surgeon: Melrose Nakayama, MD;  Location: [place];  Service: Open Heart Surgery;  Laterality: N/A;   RADIOLOGY WITH ANESTHESIA N/A 11/12/2021   Procedure: IR WITH ANESTHESIA;  Surgeon: Luanne Bras, MD;  Location: [place];  Service: Radiology;  Laterality: N/A;   TEE WITHOUT CARDIOVERSION N/A 08/11/2020   Procedure: TRANSESOPHAGEAL ECHOCARDIOGRAM (TEE);  Surgeon: Constance Haw, MD;  Location: [place] [facility];  Service: Cardiovascular;  Laterality: N/A;   TEE WITHOUT CARDIOVERSION N/A 11/08/2021   Procedure: TRANSESOPHAGEAL ECHOCARDIOGRAM (TEE);  Surgeon: Minna Merritts, MD;  Location: [facility];  Service: Cardiovascular;  Laterality: N/A;   TEE WITHOUT CARDIOVERSION N/A 03/06/2022   Procedure: TRANSESOPHAGEAL ECHOCARDIOGRAM (TEE);  Surgeon: Melrose Nakayama, MD;  Location: [place];  Service: Open Heart Surgery;  Laterality: N/A;   TUBAL LIGATION     x 2   UPPER GI ENDOSCOPY  08/17/2015   Procedure: UPPER GI ENDOSCOPY;  Surgeon: Johnathan Hausen, MD;  Location: [facility];  Service: General;;     Current Outpatient Medications  Medication Sig Dispense Refill   apixaban (ELIQUIS) 5 MG TABS tablet TAKE 1 TABLET(5 MG) BY MOUTH TWICE DAILY 180 tablet 1   aspirin EC 81 MG tablet Take 1 tablet (81 mg total) by mouth daily. Swallow whole. 30 tablet 12   b complex vitamins capsule Take 1 capsule by mouth daily.     Cholecalciferol (VITAMIN D3 PO) Take 1 tablet by mouth in the morning.     cyclobenzaprine (FLEXERIL) 10 MG tablet Take 10 mg by mouth 2 (two) times daily as needed for muscle spasms. 1/2 - 1  po bid prn     furosemide (LASIX) 40 MG tablet Take 0.5 tablets (20 mg total) by mouth daily. Take an additional 20mg  tablet as needed 45 tablet 3   gabapentin (NEURONTIN) 100 MG capsule Take 100 mg by mouth at bedtime.     hydrOXYzine (ATARAX) 10 MG tablet Take 10 mg by mouth 3 (three) times daily as needed.     midodrine (PROAMATINE) 5 MG tablet Take 3 tablets (15 mg total) by mouth 2 (two) times daily with a meal. 180 tablet 7   pantoprazole (PROTONIX) 20 MG tablet Take 20 mg by mouth daily.     potassium chloride SA (KLOR-CON M20) 20 MEQ tablet Take 1 tablet once daily, with Lasix, for 7 days 7 tablet 0   pravastatin (PRAVACHOL) 20 MG tablet Take 20 mg by mouth daily.     rOPINIRole (REQUIP) 0.5 MG tablet Take 0.5 mg by mouth 3 (three) times daily.     No current facility-administered medications for this visit.    Allergies:   Atorvastatin, Codeine, and Hydrocodone   Social History:  The patient  reports that she quit smoking about 2 months ago. Her  smoking use included cigarettes. She has a 7.75 pack-year smoking history. She has been exposed to tobacco smoke. She has never used smokeless tobacco. She reports current alcohol use of about 1.0 - 2.0 standard drink of alcohol per week. She reports that she does not use drugs.   Family History:  The patient's family history includes Diabetes in her mother; Ehlers-Danlos syndrome in her daughter; Familial dysautonomia in her daughter; Heart failure in her mother; Hypertension in her mother.   ROS:  Please see the history of present illness.   Otherwise, review of systems is positive for none.   All other systems are reviewed and negative.   PHYSICAL EXAM: VS:  BP 114/74   Pulse 76   Ht 5\' 4"  (1.626 m)   Wt 147 lb 6.4 oz (66.9 kg)   SpO2 99%   BMI 25.30 kg/m  , BMI Body mass index is 25.3 kg/m. GEN: Well nourished, well developed, in no acute distress  HEENT: normal  Neck: no JVD, carotid bruits, or masses Cardiac: RRR; no murmurs, rubs, or gallops, 2+ edema  Respiratory:  clear to auscultation bilaterally, normal work of breathing GI: soft, nontender, nondistended, + BS MS: no deformity or atrophy  Skin: warm and dry Neuro:  Strength and sensation are intact Psych: euthymic mood, full affect  EKG:  EKG is ordered today. Personal review of the ekg ordered shows junctional rhythm, rate 72  Recent Labs: 03/03/2022: ALT 26 08/02/2022: TSH 2.270 10/29/2022: B Natriuretic Peptide 439.5 10/31/2022: Hemoglobin 12.0; Magnesium 1.9; Platelets 269 11/06/2022: BUN 17; Creatinine, Ser 0.94; Potassium 4.2; Sodium 138    Lipid Panel     Component Value Date/Time   CHOL 153 10/29/2022 1639   CHOL 165 08/02/2022 1507   TRIG 35 10/29/2022 1639   HDL 71 10/29/2022 1639   HDL 69 08/02/2022 1507   CHOLHDL 2.2 10/29/2022 1639   VLDL 7 10/29/2022 1639   LDLCALC 75 10/29/2022 1639   LDLCALC 83 08/02/2022 1507     Wt Readings from Last 3 Encounters:  12/08/22 147 lb 6.4 oz (66.9 kg)   11/06/22 137 lb (62.1 kg)  10/31/22 133 lb 11.2 oz (60.6 kg)      Other studies Reviewed: Additional studies/ records that were reviewed today include: Cardiac MRI 01/28/2020 Review of the above records today demonstrates:  1.  Normal LV chamber size, with hyperdynamic function, LVEF 67%.   2.  Normal RV chamber size, RVEF 70%.   3. 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 Maximal wall thickness at the mid ventricular level is 19 mm.   4. Large area of dense delayed myocardial enhancement at the mid ventricular level in the area of maximal wall thickness. Visually appears > 15% of the myocardial mass.   5. Severe left atrial enlargement. Iatrogenic left to right atrial level shunt.   6.  No findings to suggests cardiac amyloidosis.  ECV 22%.  TEE 11/08/21   1. Left ventricular ejection fraction, by estimation, is 50-55 %. The  left ventricle has normal function. The left ventricle has no regional  wall motion abnormalities. There is mild concentric left ventricular  hypertrophy.   2. Right ventricular systolic function is normal. The right ventricular  size is normal.   3. Left atrial size was severely dilated. No left atrial/left atrial  appendage thrombus was detected.   4. Right atrial size was moderately dilated.   5. The mitral valve is normal in structure. No evidence of mitral valve  regurgitation. No evidence of mitral stenosis.   6. Tricuspid valve regurgitation is moderate.   7. The aortic valve is normal in structure. Aortic valve regurgitation is  not visualized. Aortic valve sclerosis is present, with no evidence of  aortic valve stenosis.   8. The inferior vena cava is normal in size with greater than 50%  respiratory variability, suggesting right atrial pressure of 3 mmHg.   9. Agitated saline contrast bubble study was negative, with no evidence  of any interatrial shunt.   ASSESSMENT AND PLAN:  1.   Persistent atrial fibrillation/flutter: Currently on Eliquis.  CHA2DS2-VASc of 3.  Status post ablation x 2 as well as surgical maze with left atrial appendage clipping.  She has been having episodes of junctional rhythm with pauses.  She is planned for ICD implant 12/15/2022.  She Salle Brandle need an ICD due to hypertrophic cardiomyopathy.  Risk and benefits have been discussed.  Risk include bleeding, tamponade, infection, pneumothorax, lead dislodgment, MI, stroke, kidney damage, death.  He understands these risks and is agreed to the procedure.  2.  Tobacco abuse: Complete cessation encouraged  3.  Morbid obesity: Post gastric bypass  4.  Hypertrophic cardiomyopathy: Found on cardiac MRI.  Has LGE.  Managed by primary cardiology.  5.  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  Febrile on last note has been removed.  Continue with current management.  6.  Second hypercoagulable state: Currently on Eliquis post left atrial appendage clipping.   Current medicines are reviewed at length with the patient today.   The patient does not have concerns regarding her medicines.  The following changes were made today: none  Labs/ tests ordered today include:  Orders Placed This Encounter  Procedures   Basic metabolic panel   CBC   EKG 12-Lead     Disposition:   FU 3 months  Signed, Pritesh Sobecki Meredith Leeds, MD  12/08/2022 5:06 PM     [address] [place] [id number] [phone] (office) [phone] (fax)

## 2022-12-08 NOTE — Patient Instructions (Addendum)
Medication Instructions:  Your physician recommends that you continue on your current medications as directed. Please refer to the Current Medication list given to you today.  *If you need a refill on your cardiac medications before your next appointment, please call your pharmacy*   Lab Work: Stop by the [facility] in [place] by 3/27 for BMET & CBC.  You do NOT need to be fasting.  If you have labs (blood work) drawn today and your tests are completely normal, you will receive your results only by: [place] (if you have MyChart) OR A paper copy in the mail If you have any lab test that is abnormal or we need to change your treatment, we will call you to review the results.   Testing/Procedures: None ordered   Follow-Up: At [facility], you and your health needs are our priority.  As part of our continuing mission to provide you with exceptional heart care, we have created designated Provider Care Teams.  These Care Teams include your primary Cardiologist (physician) and Advanced Practice Providers (APPs -  Physician Assistants and Nurse Practitioners) who all work together to provide you with the care you need, when you need it.  Your next appointment:   2 week(s) after your implant  The format for your next appointment:   In Person  Provider:   Device clinic for a wound check     Thank you for choosing [facility]!!   Trinidad Curet, RN [phone]  Other Instructions    Cardioverter Defibrillator Implantation An implantable cardioverter defibrillator (ICD) is a small, lightweight, battery-powered device that is placed (implanted) under the skin in the chest or abdomen. Your caregiver may prescribe an ICD if: You have had an irregular heart rhythm (arrhythmia) that originated in the lower chambers of the heart (ventricles). Your heart has been damaged by a disease (such as coronary artery disease) or heart condition (such as a heart attack). An ICD  consists of a battery that lasts several years, a small computer called a pulse generator, and wires called leads that go into the heart. It is used to detect and correct two dangerous arrhythmias: a rapid heart rhythm (tachycardia) and an arrhythmia in which the ventricles contract in an uncoordinated way (fibrillation). When an ICD detects tachycardia, it sends an electrical signal to the heart that restores the heartbeat to normal (cardioversion). This signal is usually painless. If cardioversion does not work or if the ICD detects fibrillation, it delivers a small electrical shock to the heart (defibrillation) to restart the heart. The shock may feel like a strong jolt in the chest. ICDs may be programmed to correct other problems. Sometimes, ICDs are programmed to act as another type of implantable device called a pacemaker. Pacemakers are used to treat a slow heartbeat (bradycardia). LET YOUR CAREGIVER KNOW ABOUT: Any allergies you have. All medicines you are taking, including vitamins, herbs, eyedrops, and over-the-counter medicines and creams. Previous problems you or members of your family have had with the use of anesthetics. Any blood disorders you have had. Other health problems you have. RISKS AND COMPLICATIONS Generally, the procedure to implant an ICD is safe. However, as with any surgical procedure, complications can occur. Possible complications associated with implanting an ICD include: Swelling, bleeding, or bruising at the site where the ICD was implanted. Infection at the site where the ICD was implanted. A reaction to medicine used during the procedure. Nerve, heart, or blood vessel damage. Blood clots. BEFORE THE PROCEDURE You may  need to have blood tests, heart tests, or a chest X-ray done before the day of the procedure. Ask your caregiver about changing or stopping your regular medicines. Make plans to have someone drive you home. You may need to stay in the hospital  overnight after the procedure. Stop smoking at least 24 hours before the procedure. Take a bath or shower the night before the procedure. You may need to scrub your chest or abdomen with a special type of soap. Do not eat or drink before your procedure for as long as directed by your caregiver. Ask if it is okay to take any needed medicine with a small sip of water. PROCEDURE  The procedure to implant an ICD in your chest or abdomen is usually done at a hospital in a room that has a large X-ray machine called a fluoroscope. The machine will be above you during the procedure. It will help your caregiver see your heart during the procedure. Implanting an ICD usually takes 1-3 hours. Before the procedure:  Small monitors will be put on your body. They will be used to check your heart, blood pressure, and oxygen level. A needle will be put into a vein in your hand or arm. This is called an intravenous (IV) access tube. Fluids and medicine will flow directly into your body through the IV tube. Your chest or abdomen will be cleaned with a germ-killing (antiseptic) solution. The area may be shaved. You may be given medicine to help you relax (sedative). You will be given a medicine called a local anesthetic. This medicine will make the surgical site numb while the ICD is implanted. You will be sleepy but awake during the procedure. After you are numb the procedure will begin. The caregiver will: Make a small cut (incision). This will make a pocket deep under your skin that will hold the pulse generator. Guide the leads through a large blood vessel into your heart and attach them to the heart muscles. Depending on the ICD, the leads may go into one ventricle or they may go to both ventricles and into an upper chamber of the heart (atrium). Test the ICD. Close the incision with stitches, glue, or staples. AFTER THE PROCEDURE You may feel pain. Some pain is normal. It may last a few days. You may stay in a  recovery area until the local anesthetic has worn off. Your blood pressure and pulse will be checked often. You will be taken to a room where your heart will be monitored. A chest X-ray will be taken. This is done to check that the cardioverter defibrillator is in the right place. You may stay in the hospital overnight. A slight bump may be seen over the skin where the ICD was placed. Sometimes, it is possible to feel the ICD under the skin. This is normal. In the months and years afterward, your caregiver will check the device, the leads, and the battery every few months. Eventually, when the battery is low, the ICD will be replaced.   This information is not intended to replace advice given to you by your health care provider. Make sure you discuss any questions you have with your health care provider.   Document Released: 05/27/2002 Document Revised: 06/25/2013 Document Reviewed: 09/23/2012 Elsevier Interactive Patient Education 2016 [employer] Defibrillator Implantation, Care After This sheet gives you information about how to care for yourself after your procedure. Your health care provider may also give you more specific instructions.  If you have problems or questions, contact your health care provider. What can I expect after the procedure? After the procedure, it is common to have: Some pain. It may last a few days. A slight bump over the skin where the device was placed. Sometimes, it is possible to feel the device under the skin. This is normal.  During the months and years after your procedure, your health care provider will check the device, the leads, and the battery every few months. Eventually, when the battery is low, the device will be replaced. Follow these instructions at home: Medicines Take over-the-counter and prescription medicines only as told by your health care provider. If you were prescribed an antibiotic medicine, take it as told by your health  care provider. Do not stop taking the antibiotic even if you start to feel better. Incision care  Follow instructions from your health care provider about how to take care of your incision area. Make sure you: Wash your hands with soap and water before you change your bandage (dressing). If soap and water are not available, use hand sanitizer. Change your dressing as told by your health care provider. Leave stitches (sutures), skin glue, or adhesive strips in place. These skin closures may need to stay in place for 2 weeks or longer. If adhesive strip edges start to loosen and curl up, you may trim the loose edges. Do not remove adhesive strips completely unless your health care provider tells you to do that. Check your incision area every day for signs of infection. Check for: More redness, swelling, or pain. More fluid or blood. Warmth. Pus or a bad smell. Do not use lotions or ointments near the incision area unless told by your health care provider. Keep the incision area clean and dry for 2-3 days after the procedure or for as long as told by your health care provider. It takes several weeks for the incision site to heal completely. Do not take baths, swim, or use a hot tub until your health care provider approves. Activity Try to walk a little every day. Exercising is important after this procedure. Also, use your shoulder on the side of the defibrillator in daily tasks that do not require a lot of motion. For at least 6 weeks: Do not lift your upper arm above your shoulders. This means no tennis, golf, or swimming for this period of time. If you tend to sleep with your arm above your head, use a restraint to prevent this during sleep. Avoid sudden jerking, pulling, or chopping movements that pull your upper arm far away from your body. Ask your health care provider when you may go back to work. Check with your health care provider before you start to drive or play sports. Electric and  magnetic fields Tell all health care providers that you have a defibrillator. This may prevent them from giving you an MRI scan because strong magnets are used for that test. If you must pass through a metal detector, quickly walk through it. Do not stop under the detector, and do not stand near it. Avoid places or objects that have a strong electric or magnetic field, including: Engineer, maintenance. At the airport, let officials know that you have a defibrillator. Your defibrillator ID card will let you be checked in a way that is safe for you and will not damage your defibrillator. Also, do not let a security person wave a magnetic wand near your defibrillator. That can make it stop working.  Power plants. Large electrical generators. Anti-theft systems or electronic article surveillance (EAS). Radiofrequency transmission towers, such as cell phone and radio towers. Do not use amateur (ham) radio equipment or electric (arc) welding torches. Some devices are safe to use if held at least 12 inches (30 cm) from your defibrillator. These include power tools, lawn mowers, and speakers. If you are unsure if something is safe to use, ask your health care provider. Do not use MP3 player headphones. They have magnets. You may safely use electric blankets, heating pads, computers, and microwave ovens. When using your cell phone, hold it to the ear that is on the opposite side from the defibrillator. Do not leave your cell phone in a pocket over the defibrillator. General instructions Follow diet instructions from your health care provider, if this applies. Always keep your defibrillator ID card with you. The card should list the implant date, device model, and manufacturer. Consider wearing a medical alert bracelet or necklace. Have your defibrillator checked every 3-6 months or as often as told by your health care provider. Most defibrillators last for 4-8 years. Keep all follow-up visits as told by your  health care provider. This is important for your health care provider to make sure your chest is healing the way it should. Ask your health care provider when you should come back to have your stitches or staples taken out. Contact a health care provider if: You feel one shock in your chest. You gain weight suddenly. Your legs or feet swell more than they have before. It feels like your heart is fluttering or skipping beats (heart palpitations). You have more redness, swelling, or pain around your incision. You have more fluid or blood coming from your incision. Your incision feels warm to the touch. You have pus or a bad smell coming from your incision. You have a fever. Get help right away if: You have chest pain. You feel more than one shock. You feel more short of breath than you have felt before. You feel more light-headed than you have felt before. Your incision starts to open up. This information is not intended to replace advice given to you by your health care provider. Make sure you discuss any questions you have with your health care provider. Document Released: 03/24/2005 Document Revised: 03/24/2016 Document Reviewed: 02/09/2016 Elsevier Interactive Patient Education  2018 [place] Discharge Instructions for  Pacemaker/Defibrillator Patients  ACTIVITY No heavy lifting or vigorous activity with your left/right arm for 6 to 8 weeks.  Do not raise your left/right arm above your head for one week.  Gradually raise your affected arm as drawn below.           __  NO DRIVING for     ; you may begin driving on     .  WOUND CARE Keep the wound area clean and dry.  Do not get this area wet for one week. No showers for one week; you may shower on     . The tape/steri-strips on your wound will fall off; do not pull them off.  No bandage is needed on the site.  DO  NOT apply any creams, oils, or ointments to the wound area. If you notice any drainage or  discharge from the wound, any swelling or bruising at the site, or you develop a fever > 101? F after you are discharged home, call the office at once.  SPECIAL INSTRUCTIONS You are still able to use  cellular telephones; use the ear opposite the side where you have your pacemaker/defibrillator.  Avoid carrying your cellular phone near your device. When traveling through airports, show security personnel your identification card to avoid being screened in the metal detectors.  Ask the security personnel to use the hand wand. Avoid arc welding equipment, MRI testing (magnetic resonance imaging), TENS units (transcutaneous nerve stimulators).  Call the office for questions about other devices. Avoid electrical appliances that are in poor condition or are not properly grounded. Microwave ovens are safe to be near or to operate.  ADDITIONAL INFORMATION FOR DEFIBRILLATOR PATIENTS SHOULD YOUR DEVICE GO OFF: If your device goes off ONCE and you feel fine afterward, notify the device clinic nurses. If your device goes off ONCE and you do not feel well afterward, call 911. If your device goes off TWICE, call 911. If your device goes off THREE TIMES IN ONE DAY, call 911.  DO NOT DRIVE YOURSELF OR A FAMILY MEMBER WITH A DEFIBRILLATOR TO THE HOSPITAL--CALL 911.

## 2022-12-08 NOTE — H&P (View-Only) (Signed)
 Electrophysiology Office Note   Date:  12/08/2022   ID:  Hannah Zimmerman, DOB 05/31/1971, MRN [id number]  PCP:  Whitaker, Jason Hestle, PA-C  Cardiologist:  Gollan Primary Electrophysiologist:  Clare Casto Martin [place], MD    Chief Complaint: AF   History of Present Illness: Hannah Zimmerman is a 51 y.o. female who is being seen today for the evaluation of AF at the request of Whitaker, Jason Hestle,*. Presenting today for electrophysiology evaluation.  History seen for heart failure preserved ejection fraction, morbid obesity post gastric bypass, persistent atrial fibrillation, tobacco abuse.  She is post atrial fibrillation ablation 12/24/2019 with repeat ablation 08/11/2020.  She presented to [facility] with atrial fibrillation.  She had a TEE and cardioversion.  Post cardioversion she had a cryptogenic stroke.  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  She is fibroblast, surgical, left atrial appendage clipping 03/06/2022.  She had been having episodes of syncope.  She wore a cardiac monitor with significant pauses.  She also has hypertrophic cardiomyopathy with LGE and nonsustained VT on cardiac monitor.  Today, denies symptoms of palpitations, chest pain, shortness of breath, orthopnea, PND, lower extremity edema, claudication, dizziness, presyncope, bleeding, or neurologic sequela. The patient is tolerating medications without difficulties.  She did have an episode of syncope.  She felt palpitations at the time.  She is planned for ICD implant next week.    Past Medical History:  Diagnosis Date   (HFpEF) heart failure with preserved ejection fraction (HCC)    a. 07/2019 Echo: EF 60-65%, mod LVH. Sev dil LA, mildly dil RA. Mod elev PASP; b. 07/2020 TEE: EF 50-55%, no rwma, sev conc LVH. Nl RV fxn. Mod dil RA. Mild-mod MR, mod TR.   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 07/2020 Cardiac CT: Cor Ca2+ = 338 (99th %'ile).   Complication of anesthesia 20  years ago   a. spinal with first c section went too high stopped breathing, low bp with 2nd c section, no further issues with anesthesia   Heart murmur    a. 07/2019 Echo: no significant valvular dzs; b. 07/2020 TEE: Mild-mod MR, mod TR.   History of hiatal hernia    Hypertrophic cardiomyopathy (HCC)    a. 07/2019 Echo: Mod LVH; b. 01/2020 cMRI: EF 67%, HCM w/o obstruction. Max wall thickness 19mm. Sev LAE w/ L->R atrial level shunt. Large area of LGE @ mid-ventricular level in area of max wall thickness; c. 07/2020 Cardiac CT: Asymm hypertrophy up to 19mm in mid inferoseptum consistent w/ known HCM.   Morbid obesity (HCC)    a. 09/2014 s/p gastric bypass.   Persistent atrial fibrillation (HCC)    a. Dx 07/2019-->CHA2DS2VASc = 2 (diast CHF/Fem)-->Eliquis; b. 09/2018 s/p DCCV (150J (biphasic) x 1); c. 12/2019 RFCA/PVI; d. 07/2020 repeat RFCA/PVI.   Rash    both arms from old bed bug bites, healing   Stroke (HCC)    Tobacco abuse    Typical atrial flutter (HCC)    a. 12/2019 s/p RFCA.   Past Surgical History:  Procedure Laterality Date   ABDOMINAL HYSTERECTOMY     ANKLE SURGERY     left x 2   ATRIAL FIBRILLATION ABLATION N/A 12/24/2019   Procedure: ATRIAL FIBRILLATION ABLATION;  Surgeon: Icholas Irby Martin, MD;  [facility];  Service: Cardiovascular;  Laterality: N/A;   ATRIAL FIBRILLATION ABLATION N/A 08/11/2020   Procedure: ATRIAL FIBRILLATION ABLATION;  Surgeon: Maura Braaten Martin, MD;    Location: [facility];  Service: Cardiovascular;  Laterality: N/A;   CARDIOVERSION N/A 09/24/2019   Procedure: CARDIOVERSION;  Surgeon: Gollan, Timothy J, MD;  Location: [facility];  Service: Cardiovascular;  Laterality: N/A;   CARDIOVERSION N/A 10/17/2019   Procedure: CARDIOVERSION;  Surgeon: Gollan, Timothy J, MD;  Location: [facility];  Service: Cardiovascular;  Laterality: N/A;   CARDIOVERSION N/A 11/08/2021   Procedure: CARDIOVERSION;  Surgeon: Gollan, Timothy J, MD;  Location:  [facility];  Service: Cardiovascular;  Laterality: N/A;   CESAREAN SECTION     x 3   CLIPPING OF ATRIAL APPENDAGE N/A 03/06/2022   Procedure: CLIPPING OF ATRIAL APPENDAGE USING ATRICURE  CLIP SIZE 45MM;  Surgeon: Hendrickson, Steven C, MD;  Location: [facility];  Service: Open Heart Surgery;  Laterality: N/A;   EXCISION OF ATRIAL MYXOMA N/A 03/06/2022   Procedure: RESECTION OF AORTIC VALVE TUMOR;  Surgeon: Hendrickson, Steven C, MD;  Location: [facility];  Service: Open Heart Surgery;  Laterality: N/A;   HERNIA REPAIR     x2 one was for sure a hiatal hernia   IR ANGIO EXTRACRAN SEL COM CAROTID INNOMINATE UNI L MOD SED  11/13/2021   IR CT HEAD LTD  11/13/2021   IR PERCUTANEOUS ART THROMBECTOMY/INFUSION INTRACRANIAL INC DIAG ANGIO  11/13/2021   IR THORACENTESIS ASP PLEURAL SPACE W/IMG GUIDE  03/14/2022   LAPAROSCOPIC GASTRIC SLEEVE RESECTION  2020   LAPAROSCOPIC GASTRIC SLEEVE RESECTION WITH HIATAL HERNIA REPAIR  08/17/2015   Procedure: LAPAROSCOPIC GASTRIC SLEEVE RESECTION WITH HIATAL HERNIA REPAIR;  Surgeon: Matthew Martin, MD;  Location: [facility];  Service: General;;   LEFT HEART CATH AND CORONARY ANGIOGRAPHY N/A 10/30/2022   Procedure: LEFT HEART CATH AND CORONARY ANGIOGRAPHY;  Surgeon: Arida, Muhammad A, MD;  Location: [facility];  Service: Cardiovascular;  Laterality: N/A;   MAZE N/A 03/06/2022   Procedure: MAZE;  Surgeon: Hendrickson, Steven C, MD;  [facility];  Service: Open Heart Surgery;  Laterality: N/A;   RADIOLOGY WITH ANESTHESIA N/A 11/12/2021   Procedure: IR WITH ANESTHESIA;  Surgeon: Deveshwar, Sanjeev, MD;  Location: [facility];  Service: Radiology;  Laterality: N/A;   TEE WITHOUT CARDIOVERSION N/A 08/11/2020   Procedure: TRANSESOPHAGEAL ECHOCARDIOGRAM (TEE);  Surgeon: Jamorion Gomillion Martin, MD;  [facility];  Service: Cardiovascular;  Laterality: N/A;   TEE WITHOUT CARDIOVERSION N/A 11/08/2021   Procedure: TRANSESOPHAGEAL ECHOCARDIOGRAM (TEE);  Surgeon: Gollan,  Timothy J, MD;  Location: [facility];  Service: Cardiovascular;  Laterality: N/A;   TEE WITHOUT CARDIOVERSION N/A 03/06/2022   Procedure: TRANSESOPHAGEAL ECHOCARDIOGRAM (TEE);  Surgeon: Hendrickson, Steven C, MD;  Location: [facility];  Service: Open Heart Surgery;  Laterality: N/A;   TUBAL LIGATION     x 2   UPPER GI ENDOSCOPY  08/17/2015   Procedure: UPPER GI ENDOSCOPY;  Surgeon: Matthew Martin, MD;  Location: [facility];  Service: General;;     Current Outpatient Medications  Medication Sig Dispense Refill   apixaban (ELIQUIS) 5 MG TABS tablet TAKE 1 TABLET(5 MG) BY MOUTH TWICE DAILY 180 tablet 1   aspirin EC 81 MG tablet Take 1 tablet (81 mg total) by mouth daily. Swallow whole. 30 tablet 12   b complex vitamins capsule Take 1 capsule by mouth daily.     Cholecalciferol (VITAMIN D3 PO) Take 1 tablet by mouth in the morning.     cyclobenzaprine (FLEXERIL) 10 MG tablet Take 10 mg by mouth 2 (two) times daily as needed for muscle spasms. 1/2 - 1   po bid prn     furosemide (LASIX) 40 MG tablet Take 0.5 tablets (20 mg total) by mouth daily. Take an additional 20mg tablet as needed 45 tablet 3   gabapentin (NEURONTIN) 100 MG capsule Take 100 mg by mouth at bedtime.     hydrOXYzine (ATARAX) 10 MG tablet Take 10 mg by mouth 3 (three) times daily as needed.     midodrine (PROAMATINE) 5 MG tablet Take 3 tablets (15 mg total) by mouth 2 (two) times daily with a meal. 180 tablet 7   pantoprazole (PROTONIX) 20 MG tablet Take 20 mg by mouth daily.     potassium chloride SA (KLOR-CON M20) 20 MEQ tablet Take 1 tablet once daily, with Lasix, for 7 days 7 tablet 0   pravastatin (PRAVACHOL) 20 MG tablet Take 20 mg by mouth daily.     rOPINIRole (REQUIP) 0.5 MG tablet Take 0.5 mg by mouth 3 (three) times daily.     No current facility-administered medications for this visit.    Allergies:   Atorvastatin, Codeine, and Hydrocodone   Social History:  The patient  reports that she quit smoking about 2 months ago. Her  smoking use included cigarettes. She has a 7.75 pack-year smoking history. She has been exposed to tobacco smoke. She has never used smokeless tobacco. She reports current alcohol use of about 1.0 - 2.0 standard drink of alcohol per week. She reports that she does not use drugs.   Family History:  The patient's family history includes Diabetes in her mother; Ehlers-Danlos syndrome in her daughter; Familial dysautonomia in her daughter; Heart failure in her mother; Hypertension in her mother.   ROS:  Please see the history of present illness.   Otherwise, review of systems is positive for none.   All other systems are reviewed and negative.   PHYSICAL EXAM: VS:  BP 114/74   Pulse 76   Ht 5' 4" (1.626 m)   Wt 147 lb 6.4 oz (66.9 kg)   SpO2 99%   BMI 25.30 kg/m  , BMI Body mass index is 25.3 kg/m. GEN: Well nourished, well developed, in no acute distress  HEENT: normal  Neck: no JVD, carotid bruits, or masses Cardiac: RRR; no murmurs, rubs, or gallops, 2+ edema  Respiratory:  clear to auscultation bilaterally, normal work of breathing GI: soft, nontender, nondistended, + BS MS: no deformity or atrophy  Skin: warm and dry Neuro:  Strength and sensation are intact Psych: euthymic mood, full affect  EKG:  EKG is ordered today. Personal review of the ekg ordered shows junctional rhythm, rate 72  Recent Labs: 03/03/2022: ALT 26 08/02/2022: TSH 2.270 10/29/2022: B Natriuretic Peptide 439.5 10/31/2022: Hemoglobin 12.0; Magnesium 1.9; Platelets 269 11/06/2022: BUN 17; Creatinine, Ser 0.94; Potassium 4.2; Sodium 138    Lipid Panel     Component Value Date/Time   CHOL 153 10/29/2022 1639   CHOL 165 08/02/2022 1507   TRIG 35 10/29/2022 1639   HDL 71 10/29/2022 1639   HDL 69 08/02/2022 1507   CHOLHDL 2.2 10/29/2022 1639   VLDL 7 10/29/2022 1639   LDLCALC 75 10/29/2022 1639   LDLCALC 83 08/02/2022 1507     Wt Readings from Last 3 Encounters:  12/08/22 147 lb 6.4 oz (66.9 kg)   11/06/22 137 lb (62.1 kg)  10/31/22 133 lb 11.2 oz (60.6 kg)      Other studies Reviewed: Additional studies/ records that were reviewed today include: Cardiac MRI 01/28/2020 Review of the above records today demonstrates:    1.  Normal LV chamber size, with hyperdynamic function, LVEF 67%.   2.  Normal RV chamber size, RVEF 70%.   3. 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 Maximal wall thickness at the mid ventricular level is 19 mm.   4. Large area of dense delayed myocardial enhancement at the mid ventricular level in the area of maximal wall thickness. Visually appears > 15% of the myocardial mass.   5. Severe left atrial enlargement. Iatrogenic left to right atrial level shunt.   6.  No findings to suggests cardiac amyloidosis.  ECV 22%.  TEE 11/08/21   1. Left ventricular ejection fraction, by estimation, is 50-55 %. The  left ventricle has normal function. The left ventricle has no regional  wall motion abnormalities. There is mild concentric left ventricular  hypertrophy.   2. Right ventricular systolic function is normal. The right ventricular  size is normal.   3. Left atrial size was severely dilated. No left atrial/left atrial  appendage thrombus was detected.   4. Right atrial size was moderately dilated.   5. The mitral valve is normal in structure. No evidence of mitral valve  regurgitation. No evidence of mitral stenosis.   6. Tricuspid valve regurgitation is moderate.   7. The aortic valve is normal in structure. Aortic valve regurgitation is  not visualized. Aortic valve sclerosis is present, with no evidence of  aortic valve stenosis.   8. The inferior vena cava is normal in size with greater than 50%  respiratory variability, suggesting right atrial pressure of 3 mmHg.   9. Agitated saline contrast bubble study was negative, with no evidence  of any interatrial shunt.   ASSESSMENT AND PLAN:  1.   Persistent atrial fibrillation/flutter: Currently on Eliquis.  CHA2DS2-VASc of 3.  Status post ablation x 2 as well as surgical maze with left atrial appendage clipping.  She has been having episodes of junctional rhythm with pauses.  She is planned for ICD implant 12/15/2022.  She Kenleigh Toback need an ICD due to hypertrophic cardiomyopathy.  Risk and benefits have been discussed.  Risk include bleeding, tamponade, infection, pneumothorax, lead dislodgment, MI, stroke, kidney damage, death.  He understands these risks and is agreed to the procedure.  2.  Tobacco abuse: Complete cessation encouraged  3.  Morbid obesity: Post gastric bypass  4.  Hypertrophic cardiomyopathy: Found on cardiac MRI.  Has LGE.  Managed by primary cardiology.  5.  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  Febrile on last note has been removed.  Continue with current management.  6.  Second hypercoagulable state: Currently on Eliquis post left atrial appendage clipping.   Current medicines are reviewed at length with the patient today.   The patient does not have concerns regarding her medicines.  The following changes were made today: none  Labs/ tests ordered today include:  Orders Placed This Encounter  Procedures   Basic metabolic panel   CBC   EKG 12-Lead     Disposition:   FU 3 months  Signed, Christia Coaxum Martin Hillman Attig, MD  12/08/2022 5:06 PM     [facility] [address] [place] [address] [phone] (office) [phone] (fax) 

## 2022-12-12 ENCOUNTER — Other Ambulatory Visit
Admission: RE | Admit: 2022-12-12 | Discharge: 2022-12-12 | Disposition: A | Discharge status: Home / Self Care | Payer: 59 | Source: Ambulatory Visit | Attending: Cardiology | Admitting: Cardiology

## 2022-12-12 DIAGNOSIS — I422 Other hypertrophic cardiomyopathy: Secondary | ICD-10-CM | POA: Diagnosis not present

## 2022-12-12 DIAGNOSIS — Z01812 Encounter for preprocedural laboratory examination: Secondary | ICD-10-CM | POA: Diagnosis present

## 2022-12-12 LAB — CBC
HCT: 34.6 % — ABNORMAL LOW (ref 36.0–46.0)
Hemoglobin: 11.1 g/dL — ABNORMAL LOW (ref 12.0–15.0)
MCH: 29.1 pg (ref 26.0–34.0)
MCHC: 32.1 g/dL (ref 30.0–36.0)
MCV: 90.8 fL (ref 80.0–100.0)
Platelets: 336 10*3/uL (ref 150–400)
RBC: 3.81 MIL/uL — ABNORMAL LOW (ref 3.87–5.11)
RDW: 18.2 % — ABNORMAL HIGH (ref 11.5–15.5)
WBC: 7.6 10*3/uL (ref 4.0–10.5)
nRBC: 0 % (ref 0.0–0.2)

## 2022-12-12 LAB — BASIC METABOLIC PANEL
Anion gap: 13 (ref 5–15)
BUN: 24 mg/dL — ABNORMAL HIGH (ref 6–20)
CO2: 26 mmol/L (ref 22–32)
Calcium: 9.4 mg/dL (ref 8.9–10.3)
Chloride: 103 mmol/L (ref 98–111)
Creatinine, Ser: 0.91 mg/dL (ref 0.44–1.00)
GFR, Estimated: >60 mL/min (ref >60)
Glucose, Bld: 85 mg/dL (ref 70–99)
Potassium: 3.8 mmol/L (ref 3.5–5.1)
Sodium: 142 mmol/L (ref 135–145)

## 2022-12-12 LAB — SURGICAL PCR SCREEN
MRSA, PCR: NEGATIVE
Staphylococcus aureus: NEGATIVE

## 2022-12-14 NOTE — Pre-Procedure Instructions (Signed)
Instructed patient on the following items: Arrival time 1100 Nothing to eat or drink after midnight No meds AM of procedure Responsible person to drive you home and stay with you for 24 hrs Wash with special soap night before and morning of procedure If on anti-coagulant drug instructions Eliquis- last dose 3/27

## 2022-12-15 ENCOUNTER — Ambulatory Visit (HOSPITAL_COMMUNITY)
Admission: RE | Admit: 2022-12-15 | Discharge: 2022-12-15 | Disposition: A | Discharge status: Home / Self Care | Payer: 59 | Source: Ambulatory Visit | Attending: Cardiology | Admitting: Cardiology

## 2022-12-15 ENCOUNTER — Ambulatory Visit (HOSPITAL_COMMUNITY): Payer: 59

## 2022-12-15 ENCOUNTER — Other Ambulatory Visit: Payer: Self-pay

## 2022-12-15 ENCOUNTER — Encounter (HOSPITAL_COMMUNITY)
Admission: RE | Disposition: A | Discharge status: Home / Self Care | Payer: Self-pay | Source: Ambulatory Visit | Attending: Cardiology

## 2022-12-15 DIAGNOSIS — I4892 Unspecified atrial flutter: Secondary | ICD-10-CM | POA: Diagnosis not present

## 2022-12-15 DIAGNOSIS — I4819 Other persistent atrial fibrillation: Secondary | ICD-10-CM | POA: Diagnosis not present

## 2022-12-15 DIAGNOSIS — Z950 Presence of cardiac pacemaker: Secondary | ICD-10-CM | POA: Diagnosis not present

## 2022-12-15 DIAGNOSIS — Z7901 Long term (current) use of anticoagulants: Secondary | ICD-10-CM | POA: Insufficient documentation

## 2022-12-15 DIAGNOSIS — Z9884 Bariatric surgery status: Secondary | ICD-10-CM | POA: Diagnosis not present

## 2022-12-15 DIAGNOSIS — Z8673 Personal history of transient ischemic attack (TIA), and cerebral infarction without residual deficits: Secondary | ICD-10-CM | POA: Insufficient documentation

## 2022-12-15 DIAGNOSIS — I495 Sick sinus syndrome: Secondary | ICD-10-CM | POA: Insufficient documentation

## 2022-12-15 DIAGNOSIS — I5032 Chronic diastolic (congestive) heart failure: Secondary | ICD-10-CM | POA: Diagnosis not present

## 2022-12-15 DIAGNOSIS — I421 Obstructive hypertrophic cardiomyopathy: Secondary | ICD-10-CM | POA: Diagnosis not present

## 2022-12-15 DIAGNOSIS — I422 Other hypertrophic cardiomyopathy: Secondary | ICD-10-CM | POA: Insufficient documentation

## 2022-12-15 DIAGNOSIS — R55 Syncope and collapse: Secondary | ICD-10-CM | POA: Insufficient documentation

## 2022-12-15 DIAGNOSIS — D6859 Other primary thrombophilia: Secondary | ICD-10-CM | POA: Insufficient documentation

## 2022-12-15 DIAGNOSIS — Z87891 Personal history of nicotine dependence: Secondary | ICD-10-CM | POA: Diagnosis not present

## 2022-12-15 DIAGNOSIS — Z6824 Body mass index (BMI) 24.0-24.9, adult: Secondary | ICD-10-CM | POA: Insufficient documentation

## 2022-12-15 HISTORY — PX: ICD IMPLANT: EP1208

## 2022-12-15 SURGERY — ICD IMPLANT
Anesthesia: LOCAL

## 2022-12-15 MED ORDER — CEFAZOLIN SODIUM-DEXTROSE 1-4 GM/50ML-% IV SOLN
1.0000 g | Freq: Four times a day (QID) | INTRAVENOUS | Status: DC
  Filled 2022-12-15: qty 50

## 2022-12-15 MED ORDER — FENTANYL CITRATE (PF) 100 MCG/2ML IJ SOLN
INTRAMUSCULAR | Status: DC | PRN
  Administered 2022-12-15 (×2): 25 ug via INTRAVENOUS

## 2022-12-15 MED ORDER — CHLORHEXIDINE GLUCONATE 4 % EX LIQD: 4.0000 | Freq: Once | CUTANEOUS | Status: DC

## 2022-12-15 MED ORDER — FENTANYL CITRATE (PF) 100 MCG/2ML IJ SOLN
INTRAMUSCULAR | Status: AC
  Filled 2022-12-15: qty 2

## 2022-12-15 MED ORDER — MIDAZOLAM HCL 5 MG/5ML IJ SOLN
INTRAMUSCULAR | Status: AC
  Filled 2022-12-15: qty 5

## 2022-12-15 MED ORDER — CEFAZOLIN SODIUM-DEXTROSE 2-4 GM/100ML-% IV SOLN
INTRAVENOUS | Status: AC
  Filled 2022-12-15: qty 100

## 2022-12-15 MED ORDER — SODIUM CHLORIDE 0.9 % IV SOLN
INTRAVENOUS | Status: AC
  Filled 2022-12-15: qty 2

## 2022-12-15 MED ORDER — LIDOCAINE HCL (PF) 1 % IJ SOLN
INTRAMUSCULAR | Status: AC
  Filled 2022-12-15: qty 60

## 2022-12-15 MED ORDER — LIDOCAINE HCL (PF) 1 % IJ SOLN
INTRAMUSCULAR | Status: AC
  Filled 2022-12-15: qty 30

## 2022-12-15 MED ORDER — MIDAZOLAM HCL 5 MG/5ML IJ SOLN
INTRAMUSCULAR | Status: DC | PRN
  Administered 2022-12-15 (×2): 1 mg via INTRAVENOUS

## 2022-12-15 MED ORDER — CEFAZOLIN SODIUM-DEXTROSE 2-4 GM/100ML-% IV SOLN
2.0000 g | INTRAVENOUS | Status: AC
  Administered 2022-12-15: 2 g via INTRAVENOUS

## 2022-12-15 MED ORDER — ACETAMINOPHEN 325 MG PO TABS
325.0000 mg | ORAL_TABLET | ORAL | Status: DC | PRN
  Administered 2022-12-15: 650 mg via ORAL
  Filled 2022-12-15: qty 2

## 2022-12-15 MED ORDER — HEPARIN (PORCINE) IN NACL 1000-0.9 UT/500ML-% IV SOLN
INTRAVENOUS | Status: DC | PRN
  Administered 2022-12-15: 500 mL

## 2022-12-15 MED ORDER — LIDOCAINE HCL (PF) 1 % IJ SOLN
INTRAMUSCULAR | Status: DC | PRN
  Administered 2022-12-15: 80 mL

## 2022-12-15 MED ORDER — SODIUM CHLORIDE 0.9 % IV SOLN: INTRAVENOUS | Status: DC

## 2022-12-15 MED ORDER — ONDANSETRON HCL 4 MG/2ML IJ SOLN: 4.0000 mg | Freq: Four times a day (QID) | INTRAMUSCULAR | Status: DC | PRN

## 2022-12-15 MED ORDER — SODIUM CHLORIDE 0.9 % IV SOLN
80.0000 mg | INTRAVENOUS | Status: AC
  Administered 2022-12-15: 80 mg

## 2022-12-15 SURGICAL SUPPLY — 8 items
CABLE SURGICAL S-101-97-12 (CABLE) ×1 IMPLANT
ICD COBALT XT DR DDPA2D4 (ICD Generator) IMPLANT
LEAD CAPSURE NOVUS 5076-52CM (Lead) IMPLANT
LEAD SPRINT QUAT SEC 6935M-62 (Lead) IMPLANT
PAD DEFIB RADIO PHYSIO CONN (PAD) ×1 IMPLANT
SHEATH 7FR PRELUDE SNAP 13 (SHEATH) IMPLANT
SHEATH 9FR PRELUDE SNAP 13 (SHEATH) IMPLANT
TRAY PACEMAKER INSERTION (PACKS) ×1 IMPLANT

## 2022-12-15 NOTE — Discharge Instructions (Signed)
After Your ICD (Implantable Cardiac Defibrillator)   You have a Medtronic ICD  ACTIVITY Do not lift your arm above shoulder height for 1 week after your procedure. After 7 days, you may progress as below.  You should remove your sling 24 hours after your procedure, unless otherwise instructed by your provider.     Friday December 22, 2022  Saturday December 23, 2022 Sunday December 24, 2022 Monday December 25, 2022   Do not lift, push, pull, or carry anything over 10 pounds with the affected arm until 6 weeks (Friday Jan 26, 2023 ) after your procedure.   You may drive AFTER your wound check, unless you have been told otherwise by your provider.   Ask your healthcare provider when you can go back to work   INCISION/Dressing If you are on a blood thinner such as Coumadin, Xarelto, Eliquis, Plavix, or Pradaxa please confirm with your provider when this should be resumed.  If large square, outer bandage is left in place, this can be removed after 24 hours from your procedure. Do not remove steri-strips or glue as below.   Monitor your defibrillator site for redness, swelling, and drainage. Call the device clinic at [phone] if you experience these symptoms or fever/chills.  If your incision is sealed with Steri-strips or staples, you may shower 7 days after your procedure or when told by your provider. Do not remove the steri-strips or let the shower hit directly on your site. You may wash around your site with soap and water.    If you were discharged in a sling, please do not wear this during the day more than 48 hours after your surgery unless otherwise instructed. This may increase the risk of stiffness and soreness in your shoulder.   Avoid lotions, ointments, or perfumes over your incision until it is well-healed.  You may use a hot tub or a pool AFTER your wound check appointment if the incision is completely closed.  Your ICD is designed to protect you from life threatening heart rhythms.  Because of this, you may receive a shock.   1 shock with no symptoms:  Call the office during business hours. 1 shock with symptoms (chest pain, chest pressure, dizziness, lightheadedness, shortness of breath, overall feeling unwell):  Call 911. If you experience 2 or more shocks in 24 hours:  Call 911. If you receive a shock, you should not drive for 6 months per the [place] DMV IF you receive appropriate therapy from your ICD.   ICD Alerts:  Some alerts are vibratory and others beep. These are NOT emergencies. Please call our office to let us know. If this occurs at night or on weekends, it can wait until the next business day. Send a remote transmission.  If your device is capable of reading fluid status (for heart failure), you will be offered monthly monitoring to review this with you.   DEVICE MANAGEMENT Remote monitoring is used to monitor your ICD from home. This monitoring is scheduled every 91 days by our office. It allows [place] to keep an eye on the functioning of your device to ensure it is working properly. You will routinely see your Electrophysiologist annually (more often if necessary).   You should receive your ID card for your new device in 4-8 weeks. Keep this card with you at all times once received. Consider wearing a medical alert bracelet or necklace.  Your ICD  may be MRI compatible. This will be discussed at your next office  visit/wound check.  You should avoid contact with strong electric or magnetic fields.   Do not use amateur (ham) radio equipment or electric (arc) welding torches. MP3 player headphones with magnets should not be used. Some devices are safe to use if held at least 12 inches (30 cm) from your defibrillator. These include power tools, lawn mowers, and speakers. If you are unsure if something is safe to use, ask your health care provider.  When using your cell phone, hold it to the ear that is on the opposite side from the defibrillator. Do not leave your cell  phone in a pocket over the defibrillator.  You may safely use electric blankets, heating pads, computers, and microwave ovens.  Call the office right away if: You have chest pain. You feel more than one shock. You feel more short of breath than you have felt before. You feel more light-headed than you have felt before. Your incision starts to open up.  This information is not intended to replace advice given to you by your health care provider. Make sure you discuss any questions you have with your health care provider.

## 2022-12-15 NOTE — Interval H&P Note (Signed)
History and Physical Interval Note:  12/15/2022 12:19 PM  Hannah Zimmerman  has presented today for surgery, with the diagnosis of Bradycardia.  The various methods of treatment have been discussed with the patient and family. After consideration of risks, benefits and other options for treatment, the patient has consented to  Procedure(s): ICD IMPLANT (N/A) as a surgical intervention.  The patient's history has been reviewed, patient examined, no change in status, stable for surgery.  I have reviewed the patient's chart and labs.  Questions were answered to the patient's satisfaction.     Sutton Hirsch Meredith Leeds  ICD Criteria  Current LVEF:50-55%. Within 12 months prior to implant: Yes   Heart failure history: No  Cardiomyopathy history: No.  Atrial Fibrillation/Atrial Flutter: Yes, Persistent (> 7 Days).  Ventricular tachycardia history: No.  Cardiac arrest history: No.  History of syndromes with risk of sudden death: Yes, Other  Previous ICD: No.  Current ICD indication: Primary  PPM indication: Yes. Pacing type: Both. Greater than 40% RV pacing requirement anticipated. Indication: Sick Sinus Syndrome  Class I or II Bradycardia indication present: No  Beta Blocker therapy for 3 or more months: Yes, prescribed.   Ace Inhibitor/ARB therapy for 3 or more months: Yes, prescribed.    I have seen Hannah Zimmerman is a 52 y.o. femalepre-procedural and has been referred by [facility] for consideration of ICD implant for primary prevention of sudden death.  The patient's chart has been reviewed and they meet criteria for ICD implant.  I have had a thorough discussion with the patient reviewing options.  The patient and their family (if available) have had opportunities to ask questions and have them answered. The patient and I have decided together through the [place] [employer] to implant ICD at this time.  Risks, benefits, alternatives to ICD implantation were  discussed in detail with the patient today. The patient  understands that the risks include but are not limited to bleeding, infection, pneumothorax, perforation, tamponade, vascular damage, renal failure, MI, stroke, death, inappropriate shocks, and lead dislodgement and  wishes to proceed.

## 2022-12-15 NOTE — Progress Notes (Signed)
CXR results called to [facility], MD who stated that patient  can be discharged.

## 2022-12-18 ENCOUNTER — Encounter (HOSPITAL_COMMUNITY): Payer: Self-pay | Admitting: Cardiology

## 2022-12-27 ENCOUNTER — Ambulatory Visit: Payer: 59 | Attending: Cardiology

## 2022-12-27 DIAGNOSIS — I422 Other hypertrophic cardiomyopathy: Secondary | ICD-10-CM | POA: Diagnosis not present

## 2022-12-27 LAB — CUP PACEART INCLINIC DEVICE CHECK
Date Time Interrogation Session: 20240410150904
Implantable Lead Connection Status: 753985
Implantable Lead Connection Status: 753985
Implantable Lead Implant Date: 20240329
Implantable Lead Implant Date: 20240329
Implantable Lead Location: 753859
Implantable Lead Location: 753860
Implantable Lead Model: 5076
Implantable Pulse Generator Implant Date: 20240329

## 2022-12-27 NOTE — Patient Instructions (Addendum)
   After Your ICD (Implantable Cardiac Defibrillator)    Monitor your defibrillator site for redness, swelling, and drainage. Call the device clinic at [phone] if you experience these symptoms or fever/chills.  Your incision was closed with Steri-strips or staples:  You may shower 7 days after your procedure and wash your incision with soap and water. Avoid lotions, ointments, or perfumes over your incision until it is well-healed.  You may use a hot tub or a pool after your wound check appointment if the incision is completely closed.  Do not lift, push or pull greater than 10 pounds with the affected arm Jan 27 2023. There are no other restrictions in arm movement after your wound check appointment.  Your ICD is MRI compatible.  Your ICD is designed to protect you from life threatening heart rhythms. Because of this, you may receive a shock.   1 shock with no symptoms:  Call the office during business hours. 1 shock with symptoms (chest pain, chest pressure, dizziness, lightheadedness, shortness of breath, overall feeling unwell):  Call 911. If you experience 2 or more shocks in 24 hours:  Call 911. If you receive a shock, you should not drive.  [place] DMV - no driving for 6 months if you receive appropriate therapy from your ICD.   ICD Alerts:  Some alerts are vibratory and others beep. These are NOT emergencies. Please call our office to let us know. If this occurs at night or on weekends, it can wait until the next business day. Send a remote transmission.   Remote monitoring is used to monitor your ICD from home. This monitoring is scheduled every 91 days by our office. It allows [place] to keep an eye on the functioning of your device to ensure it is working properly. You will routinely see your Electrophysiologist annually (more often if necessary).

## 2022-12-27 NOTE — Progress Notes (Signed)
Wound check appointment. Steri-strips removed. Wound without redness or edema. Incision edges approximated, wound well healed. Normal device function. Patient noted to be in accelerated junctional rhythm at 95-100bpm.  Attempt to provide AV  synchrony resulted in patient Ap/VP at 95bpm even with long AV delay. Per note in programmer patient was in accelerated junctional at time of implant.  Thresholds, sensing, and impedances consistent with implant measurements. Device programmed at 3.5V for extra safety margin until 3 month visit. Histogram distribution appropriate for patient and level of activity. No mode switches or ventricular arrhythmias noted. Patient educated about wound care, arm mobility, lifting restrictions, shock plan. ROV in 3 months with implanting physician.

## 2022-12-28 ENCOUNTER — Encounter: Payer: Self-pay | Admitting: Cardiology

## 2023-01-01 ENCOUNTER — Inpatient Hospital Stay
Admission: EM | Admit: 2023-01-01 | Discharge: 2023-01-04 | DRG: 309 | Disposition: A | Discharge status: Home / Self Care | Payer: 59 | Attending: Internal Medicine | Admitting: Internal Medicine

## 2023-01-01 ENCOUNTER — Emergency Department: Payer: 59

## 2023-01-01 ENCOUNTER — Ambulatory Visit: Payer: 59 | Attending: Cardiovascular Disease

## 2023-01-01 ENCOUNTER — Encounter (HOSPITAL_COMMUNITY): Payer: Self-pay

## 2023-01-01 ENCOUNTER — Inpatient Hospital Stay: Admit: 2023-01-01 | Payer: 59

## 2023-01-01 ENCOUNTER — Other Ambulatory Visit: Payer: Self-pay

## 2023-01-01 DIAGNOSIS — Z4502 Encounter for adjustment and management of automatic implantable cardiac defibrillator: Secondary | ICD-10-CM

## 2023-01-01 DIAGNOSIS — Z9884 Bariatric surgery status: Secondary | ICD-10-CM

## 2023-01-01 DIAGNOSIS — I272 Pulmonary hypertension, unspecified: Secondary | ICD-10-CM | POA: Diagnosis not present

## 2023-01-01 DIAGNOSIS — I422 Other hypertrophic cardiomyopathy: Secondary | ICD-10-CM | POA: Diagnosis not present

## 2023-01-01 DIAGNOSIS — Z888 Allergy status to other drugs, medicaments and biological substances status: Secondary | ICD-10-CM

## 2023-01-01 DIAGNOSIS — Z7901 Long term (current) use of anticoagulants: Secondary | ICD-10-CM | POA: Diagnosis not present

## 2023-01-01 DIAGNOSIS — I5032 Chronic diastolic (congestive) heart failure: Secondary | ICD-10-CM | POA: Diagnosis present

## 2023-01-01 DIAGNOSIS — I4891 Unspecified atrial fibrillation: Secondary | ICD-10-CM | POA: Diagnosis present

## 2023-01-01 DIAGNOSIS — Z79899 Other long term (current) drug therapy: Secondary | ICD-10-CM | POA: Diagnosis not present

## 2023-01-01 DIAGNOSIS — Z8249 Family history of ischemic heart disease and other diseases of the circulatory system: Secondary | ICD-10-CM

## 2023-01-01 DIAGNOSIS — R7401 Elevation of levels of liver transaminase levels: Secondary | ICD-10-CM | POA: Diagnosis present

## 2023-01-01 DIAGNOSIS — F17200 Nicotine dependence, unspecified, uncomplicated: Secondary | ICD-10-CM | POA: Diagnosis not present

## 2023-01-01 DIAGNOSIS — I4819 Other persistent atrial fibrillation: Secondary | ICD-10-CM | POA: Diagnosis present

## 2023-01-01 DIAGNOSIS — R079 Chest pain, unspecified: Secondary | ICD-10-CM | POA: Diagnosis not present

## 2023-01-01 DIAGNOSIS — Z95810 Presence of automatic (implantable) cardiac defibrillator: Secondary | ICD-10-CM | POA: Diagnosis not present

## 2023-01-01 DIAGNOSIS — I4729 Other ventricular tachycardia: Secondary | ICD-10-CM | POA: Diagnosis present

## 2023-01-01 DIAGNOSIS — I471 Supraventricular tachycardia, unspecified: Principal | ICD-10-CM | POA: Diagnosis present

## 2023-01-01 DIAGNOSIS — Z7982 Long term (current) use of aspirin: Secondary | ICD-10-CM | POA: Diagnosis not present

## 2023-01-01 DIAGNOSIS — D509 Iron deficiency anemia, unspecified: Secondary | ICD-10-CM | POA: Diagnosis present

## 2023-01-01 DIAGNOSIS — I421 Obstructive hypertrophic cardiomyopathy: Secondary | ICD-10-CM | POA: Diagnosis not present

## 2023-01-01 DIAGNOSIS — Z885 Allergy status to narcotic agent status: Secondary | ICD-10-CM

## 2023-01-01 DIAGNOSIS — Z8673 Personal history of transient ischemic attack (TIA), and cerebral infarction without residual deficits: Secondary | ICD-10-CM | POA: Diagnosis not present

## 2023-01-01 DIAGNOSIS — I2510 Atherosclerotic heart disease of native coronary artery without angina pectoris: Secondary | ICD-10-CM | POA: Diagnosis present

## 2023-01-01 DIAGNOSIS — I472 Ventricular tachycardia, unspecified: Principal | ICD-10-CM

## 2023-01-01 LAB — CUP PACEART INCLINIC DEVICE CHECK
Date Time Interrogation Session: 20240415161832
Implantable Lead Connection Status: 753985
Implantable Lead Connection Status: 753985
Implantable Lead Implant Date: 20240329
Implantable Lead Implant Date: 20240329
Implantable Lead Location: 753859
Implantable Lead Location: 753860
Implantable Lead Model: 5076
Implantable Pulse Generator Implant Date: 20240329

## 2023-01-01 LAB — CBC WITH DIFFERENTIAL/PLATELET
Abs Immature Granulocytes: 0.03 10*3/uL (ref 0.00–0.07)
Basophils Absolute: 0.2 10*3/uL — ABNORMAL HIGH (ref 0.0–0.1)
Basophils Relative: 3 %
Eosinophils Absolute: 0.3 10*3/uL (ref 0.0–0.5)
Eosinophils Relative: 5 %
HCT: 34.3 % — ABNORMAL LOW (ref 36.0–46.0)
Hemoglobin: 11.2 g/dL — ABNORMAL LOW (ref 12.0–15.0)
Immature Granulocytes: 0 %
Lymphocytes Relative: 28 %
Lymphs Abs: 2 10*3/uL (ref 0.7–4.0)
MCH: 29.6 pg (ref 26.0–34.0)
MCHC: 32.7 g/dL (ref 30.0–36.0)
MCV: 90.7 fL (ref 80.0–100.0)
Monocytes Absolute: 0.6 10*3/uL (ref 0.1–1.0)
Monocytes Relative: 8 %
Neutro Abs: 3.9 10*3/uL (ref 1.7–7.7)
Neutrophils Relative %: 56 %
Platelets: 303 10*3/uL (ref 150–400)
RBC: 3.78 MIL/uL — ABNORMAL LOW (ref 3.87–5.11)
RDW: 18 % — ABNORMAL HIGH (ref 11.5–15.5)
WBC: 7 10*3/uL (ref 4.0–10.5)
nRBC: 0 % (ref 0.0–0.2)

## 2023-01-01 LAB — T4, FREE: Free T4: 0.72 ng/dL (ref 0.61–1.12)

## 2023-01-01 LAB — COMPREHENSIVE METABOLIC PANEL
ALT: 23 U/L (ref 0–44)
AST: 28 U/L (ref 15–41)
Albumin: 4.1 g/dL (ref 3.5–5.0)
Alkaline Phosphatase: 83 U/L (ref 38–126)
Anion gap: 9 (ref 5–15)
BUN: 23 mg/dL — ABNORMAL HIGH (ref 6–20)
CO2: 26 mmol/L (ref 22–32)
Calcium: 8.9 mg/dL (ref 8.9–10.3)
Chloride: 106 mmol/L (ref 98–111)
Creatinine, Ser: 1.02 mg/dL — ABNORMAL HIGH (ref 0.44–1.00)
GFR, Estimated: >60 mL/min (ref >60)
Glucose, Bld: 95 mg/dL (ref 70–99)
Potassium: 3.5 mmol/L (ref 3.5–5.1)
Sodium: 141 mmol/L (ref 135–145)
Total Bilirubin: 0.6 mg/dL (ref 0.3–1.2)
Total Protein: 6.9 g/dL (ref 6.5–8.1)

## 2023-01-01 LAB — TROPONIN I (HIGH SENSITIVITY)
Troponin I (High Sensitivity): 41 ng/L — ABNORMAL HIGH (ref <18)
Troponin I (High Sensitivity): 38 ng/L — ABNORMAL HIGH (ref <18)

## 2023-01-01 LAB — TSH: TSH: 2.072 u[IU]/mL (ref 0.350–4.500)

## 2023-01-01 LAB — MAGNESIUM: Magnesium: 2.1 mg/dL (ref 1.7–2.4)

## 2023-01-01 MED ORDER — SODIUM CHLORIDE 0.9% FLUSH
3.0000 mL | Freq: Two times a day (BID) | INTRAVENOUS | Status: DC
  Administered 2023-01-01 – 2023-01-04 (×6): 3 mL via INTRAVENOUS

## 2023-01-01 MED ORDER — ACETAMINOPHEN 650 MG RE SUPP: 650.0000 mg | Freq: Four times a day (QID) | RECTAL | Status: DC | PRN

## 2023-01-01 MED ORDER — MAGNESIUM SULFATE 2 GM/50ML IV SOLN
2.0000 g | Freq: Once | INTRAVENOUS | Status: AC
  Administered 2023-01-01: 2 g via INTRAVENOUS
  Filled 2023-01-01: qty 50

## 2023-01-01 MED ORDER — ASPIRIN 81 MG PO TBEC
81.0000 mg | DELAYED_RELEASE_TABLET | Freq: Every day | ORAL | Status: DC
  Administered 2023-01-02 – 2023-01-04 (×3): 81 mg via ORAL
  Filled 2023-01-01 (×3): qty 1

## 2023-01-01 MED ORDER — POTASSIUM CHLORIDE CRYS ER 20 MEQ PO TBCR
40.0000 meq | EXTENDED_RELEASE_TABLET | Freq: Once | ORAL | Status: AC
  Administered 2023-01-01: 40 meq via ORAL
  Filled 2023-01-01: qty 2

## 2023-01-01 MED ORDER — APIXABAN 5 MG PO TABS
5.0000 mg | ORAL_TABLET | Freq: Two times a day (BID) | ORAL | Status: DC
  Administered 2023-01-02 – 2023-01-04 (×6): 5 mg via ORAL
  Filled 2023-01-01 (×6): qty 1

## 2023-01-01 MED ORDER — ACETAMINOPHEN 325 MG PO TABS: 650.0000 mg | ORAL_TABLET | Freq: Four times a day (QID) | ORAL | Status: DC | PRN

## 2023-01-01 MED ORDER — PANTOPRAZOLE SODIUM 40 MG IV SOLR
40.0000 mg | Freq: Two times a day (BID) | INTRAVENOUS | Status: DC
  Administered 2023-01-01: 40 mg via INTRAVENOUS
  Filled 2023-01-01: qty 10

## 2023-01-01 MED ORDER — NICOTINE 21 MG/24HR TD PT24
21.0000 mg | MEDICATED_PATCH | Freq: Every day | TRANSDERMAL | Status: DC
  Filled 2023-01-01 (×2): qty 1

## 2023-01-01 NOTE — Hospital Course (Signed)
Admission request  NSVT / hypertrophic CMP.  AICD few weeks back.  Palpitation and for past few  days. Medtronic AICD shocked patient. Cardiology: Amiodarone  if pt has another episode of NSVT.

## 2023-01-01 NOTE — Assessment & Plan Note (Signed)
Counseled patient on smoking complications of arthritis with smoking , patient states she is working on quitting. Nicotine patch.

## 2023-01-01 NOTE — ED Provider Notes (Signed)
[facility] Provider Note    Event Date/Time   First MD Initiated Contact with Patient 01/01/23 1956     (approximate)   History   Chief Complaint Palpitations   HPI  Hannah Zimmerman is a 52 y.o. female with past medical history of CAD, atrial fibrillation on Eliquis, diastolic CHF, stroke, HOCM status post AICD, and pulmonary hypertension who presents to the ED complaining of palpitations.  Patient reports that over the weekend she was dealing with intermittent sensation of her heart racing along with some dizziness and lightheadedness.  She had an AICD placed about 2 weeks ago and followed up with her cardiology team earlier today to have her AICD adjusted after was found that she was having nonsustained ventricular tachycardia.  She states that she went to [employer] earlier this evening, but began having palpitations similar to over the weekend.  Palpitations seem to worsen after about a minute, when she began to feel like she was going to pass out.  She then was shocked by her defibrillator, causing her to fall to the ground but she never lost consciousness.  She denies hitting her head.  On EMS arrival, patient felt like her heart was continuing to race but this improved during transport and EMS notes atrial fibrillation on monitor.  On arrival to the ED, patient feels well with no chest pain, shortness of breath, fevers, or cough.     Physical Exam   Triage Vital Signs: ED Triage Vitals  Enc Vitals Group     BP --      Pulse Rate 01/01/23 1954 (!) 102     Resp --      Temp --      Temp src --      SpO2 01/01/23 1954 100 %     Weight 01/01/23 1955 145 lb (65.8 kg)     Height 01/01/23 1955  (1.626 m)     Head Circumference --      Peak Flow --      Pain Score 01/01/23 1955 0     Pain Loc --      Pain Edu? --      Excl. in GC? --     Most recent vital signs: Vitals:   01/01/23 1954 01/01/23 1955  BP:  109/75  Pulse: (!) 102 98   Temp:  98.3 F (36.8 C)  SpO2: 100% 100%    Constitutional: Alert and oriented. Eyes: Conjunctivae are normal. Head: Atraumatic. Nose: No congestion/rhinnorhea. Mouth/Throat: Mucous membranes are moist.  Cardiovascular: Tachycardic, irregularly irregular rhythm. Grossly normal heart sounds.  2+ radial pulses bilaterally. Respiratory: Normal respiratory effort.  No retractions. Lungs CTAB.  AICD site well-healed without erythema, warmth, or drainage. Gastrointestinal: Soft and nontender. No distention. Musculoskeletal: No lower extremity tenderness nor edema.  Neurologic:  Normal speech and language. No gross focal neurologic deficits are appreciated.    ED Results / Procedures / Treatments   Labs (all labs ordered are listed, but only abnormal results are displayed) Labs Reviewed  CBC WITH DIFFERENTIAL/PLATELET - Abnormal; Notable for the following components:      Result Value   RBC 3.78 (*)    Hemoglobin 11.2 (*)    HCT 34.3 (*)    RDW 18.0 (*)    Basophils Absolute 0.2 (*)    All other components within normal limits  COMPREHENSIVE METABOLIC PANEL - Abnormal; Notable for the following components:   BUN 23 (*)    Creatinine,  Ser 1.02 (*)    All other components within normal limits  TROPONIN I (HIGH SENSITIVITY) - Abnormal; Notable for the following components:   Troponin I (High Sensitivity) 41 (*)    All other components within normal limits  MAGNESIUM     EKG  ED ECG REPORT I, Chesley Noon, the attending physician, personally viewed and interpreted this ECG.   Date: 01/01/2023  EKG Time: 20:00  Rate: 99  Rhythm: atrial fibrillation  Axis: Normal  Intervals:none  ST&T Change: None  ED ECG REPORT I, Chesley Noon, the attending physician, personally viewed and interpreted this ECG.   Date: 01/01/2023  EKG Time: 20:20  Rate: 117  Rhythm: Ectopic atrial tachycardia  Axis: Normal  Intervals:none  ST&T Change: Diffuse ST depressions without ST  elevation   RADIOLOGY Chest x-ray reviewed and interpreted by me with no infiltrate, edema, or effusion.  PROCEDURES:  Critical Care performed: Yes, see critical care procedure note(s)  .Critical Care  Performed by: Chesley Noon, MD Authorized by: Chesley Noon, MD   Critical care provider statement:    Critical care time (minutes):  30   Critical care time was exclusive of:  Separately billable procedures and treating other patients and teaching time   Critical care was necessary to treat or prevent imminent or life-threatening deterioration of the following conditions:  Cardiac failure   Critical care was time spent personally by me on the following activities:  Development of treatment plan with patient or surrogate, discussions with consultants, evaluation of patient's response to treatment, examination of patient, ordering and review of laboratory studies, ordering and review of radiographic studies, ordering and performing treatments and interventions, pulse oximetry, re-evaluation of patient's condition and review of old charts   I assumed direction of critical care for this patient from another provider in my specialty: no     Care discussed with: admitting provider      MEDICATIONS ORDERED IN ED: Medications  magnesium sulfate IVPB 2 g 50 mL (has no administration in time range)  potassium chloride SA (KLOR-CON M) CR tablet 40 mEq (has no administration in time range)     IMPRESSION / MDM / ASSESSMENT AND PLAN / ED COURSE  I reviewed the triage vital signs and the nursing notes.                              52 y.o. female with past medical history of atrial fibrillation on Eliquis, HOCM, CAD, diastolic CHF, stroke, and pulmonary hypertension who presents to the ED complaining of intermittent palpitations over the weekend with another episode this evening where she felt presyncopal and received a shock from her AICD.  Patient's presentation is most consistent with  acute presentation with potential threat to life or bodily function.  Differential diagnosis includes, but is not limited to, ventricular tachycardia, ventricular fibrillation, atrial fibrillation, electrolyte abnormality, AKI, ACS, AICD malfunction.  Patient nontoxic-appearing and in no acute distress, vital signs remarkable for tachycardia but otherwise reassuring.  She is currently asymptomatic, appears to be in atrial fibrillation with mildly increased rate but no ischemic changes noted.  Her AICD was interrogated and I spoke with [employer] rep over the phone, who confirmed that patient had episode of VT lasting for about 1 minute and 9 seconds.  Her device attempted to pace her out of this rhythm on 2 occasions but was unsuccessful, eventually delivered a shock which converted her back into atrial fibrillation.  Patient  did begin having some palpitations here in the ED with slight increase in heart rate but no wide-complex tachycardia noted.  Follow-up EKG appears consistent with ectopic atrial tachycardia with some ST depressions, however this quickly resolved.  We will give dose of IV magnesium, screen labs for electrolyte abnormality, and discuss with cardiology.  Case discussed with Dr. Gala Romney of cardiology, who agrees that patient will require admission for further observation.  He recommends against initiating IV amiodarone unless patient were to have a second treated episode of VT.  He states that patient may be admitted to [facility] for observation and evaluation by cardiology in the morning, at which point cardiology may coordinate with her EP team at [facility]  Labs are reassuring with no significant anemia, leukocytosis, tract abnormality, or AKI.  She does have low normal potassium and we will give some supplemental K to try to prevent arrhythmia, also troponin mildly elevated but similar to prior baseline.  Case discussed with hospitalist for admission.      FINAL CLINICAL IMPRESSION(S)  / ED DIAGNOSES   Final diagnoses:  Ventricular tachycardia  AICD discharge     Rx / DC Orders   ED Discharge Orders     None        Note:  This document was prepared using Dragon voice recognition software and may include unintentional dictation errors.   Chesley Noon, MD 01/01/23 2108

## 2023-01-01 NOTE — Telephone Encounter (Signed)
Patient reports palpitations over the weekend up until last night. States she could only walk 2 steps at a time then palpitations would begin. States she had a death in the family last week and was taking Hydroxyine daily X4 days (take for anxiety) which was new for her and she thinks the medication may have caused symptoms. States yesterday she checked her BP manually and was 88/56; today 98/62. Reports she drank lots of water to flush the medication from her system. States today she feels fine and has no complaints.   Reviewed with Dr. Graciela Husbands. Recommends patient come into clinic today for reprogramming. Please see DC apt notes for further.

## 2023-01-01 NOTE — Assessment & Plan Note (Signed)
Patient is followed by EP cardiology at [facility]. Discussed case with ED MD who also discussed it with cardiology that any changes to the device can be managed over the telephone and by the cardiology group here and recommended admission here. Will admit patient to stepdown unit with cardiac monitoring and keep patient NPO. Cardiology consulted Dr. Gala Romney is on-call for [facility] health cardiology. Will continue patient on her current medications.

## 2023-01-01 NOTE — ED Notes (Signed)
called to [facility] for cardiologist per MD Jess[place].Marland KitchenMarland Kitchen

## 2023-01-01 NOTE — Progress Notes (Signed)
Patient seen in device clinic for programming directed by Dr. Graciela Husbands. Please see changes below.   Added VT Zone at 175 bpm, w/ ATP X3 @ 84%, NID changed from 16 to 56. Changed VT-Mon zone from 171 bpm to 140bpm. Changed VF Zone from 207 bpm to 222 bpm.   Shari Prows, MDT Rep present for assistance.

## 2023-01-01 NOTE — Assessment & Plan Note (Signed)
Discussed with patient about avoiding NSAIDs. 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  Discussed with patient about following up with her primary care and monitoring her hemoglobin in the event it gets worse and to may need iron infusions. Refrain from using BC powders Goody powders. IV PPI therapy.

## 2023-01-01 NOTE — Assessment & Plan Note (Signed)
Pt has low blood pressure and is on midodrine with lasix.  We will continue eliquis.

## 2023-01-01 NOTE — ED Notes (Signed)
[employer] rep called and spoke to Dr Larinda Buttery about device interrogation results. Pt called out reporting she feels like she did when device fired earlier. Pt;s heartrate 125 and repeat EKG performed. Dr Larinda Buttery at bedside

## 2023-01-01 NOTE — ED Notes (Signed)
Hospitalist at bedside 

## 2023-01-01 NOTE — Assessment & Plan Note (Addendum)
Patient status post maze procedure. Patient is currently on Eliquis. Patient is not on any rate controlling agent secondary to hypotension. I suspect patient will need initiation of amiodarone therapy. Per cardiology recommendations we will monitor patient in the event that she develops another nonsustained V. tach will start patient on amiodarone.

## 2023-01-01 NOTE — H&P (Signed)
History and Physical     Patient: Hannah Zimmerman QWQ:[id number] DOB: July 13, 1971 DOA: 01/01/2023 DOS: the patient was seen and examined on 01/01/2023 PCP: Wilford Corner, PA-C   Patient coming from: Home  Chief Complaint: AICD firing.  HISTORY OF PRESENT ILLNESS: Hannah Zimmerman is an 52 y.o. female seen in ED for AICD firing today.  She had felt the palpitations and her device shocked her whole walking in the store.\ Pt just had AICD placed in march 2024 and was reprogrammed today. Pt was given asa 324 mg . Pt felt dizzy / had palpitations/ she fell to ground but didn't  pass out.  No reports of chest pain of sob.    Past Medical History:  Diagnosis Date   (HFpEF) heart failure with preserved ejection fraction    a. 07/2019 Echo: EF 60-65%, mod LVH. Sev dil LA, mildly dil RA. Mod elev PASP; b. 07/2020 TEE: EF 50-55%, no rwma, sev conc LVH. Nl RV fxn. Mod dil RA. Mild-mod MR, mod TR.   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 07/2020 Cardiac CT: Cor Ca2+ = 338 (99th %'ile).   Complication of anesthesia 20 years ago   a. spinal with first c section went too high stopped breathing, low bp with 2nd c section, no further issues with anesthesia   Heart murmur    a. 07/2019 Echo: no significant valvular dzs; b. 07/2020 TEE: Mild-mod MR, mod TR.   History of hiatal hernia    Hypertrophic cardiomyopathy    a. 07/2019 Echo: Mod LVH; b. 01/2020 cMRI: EF 67%, HCM w/o obstruction. Max wall thickness 41mm. Sev LAE w/ L->R atrial level shunt. Large area of LGE @ mid-ventricular level in area of max wall thickness; c. 07/2020 Cardiac CT: Asymm hypertrophy up to 42mm in mid inferoseptum consistent w/ known HCM.   Morbid obesity    a. 09/2014 s/p gastric bypass.   Persistent atrial fibrillation    a. Dx 07/2019-->CHA2DS2VASc = 2 (diast CHF/Fem)-->Eliquis; b. 09/2018 s/p DCCV (150J (biphasic) x 1); c. 12/2019 RFCA/PVI; d. 07/2020 repeat RFCA/PVI.   Rash    both arms  from old bed bug bites, healing   Stroke    Tobacco abuse    Typical atrial flutter    a. 12/2019 s/p RFCA.   Review of Systems  Cardiovascular:  Positive for chest pain and palpitations.  All other systems reviewed and are negative.  Allergies  Allergen Reactions   Atorvastatin Nausea Only   Atarax [Hydroxyzine] Palpitations   Codeine Hives        Hydrocodone Hives   Past Surgical History:  Procedure Laterality Date   ABDOMINAL HYSTERECTOMY     ANKLE SURGERY     left x 2   ATRIAL FIBRILLATION ABLATION N/A 12/24/2019   Procedure: ATRIAL FIBRILLATION ABLATION;  Surgeon: Regan Lemming, MD;  [facility]: [facility];  Service: Cardiovascular;  Laterality: N/A;   ATRIAL FIBRILLATION ABLATION N/A 08/11/2020   Procedure: ATRIAL FIBRILLATION ABLATION;  Surgeon: Regan Lemming, MD;  Location: [facility];  Service: Cardiovascular;  Laterality: N/A;   CARDIOVERSION N/A 09/24/2019   Procedure: CARDIOVERSION;  Surgeon: Antonieta Iba, MD;  Location: [facility] ORS;  Service: Cardiovascular;  Laterality: N/A;   CARDIOVERSION N/A 10/17/2019   Procedure: CARDIOVERSION;  Surgeon: Antonieta Iba, MD;  Location: [facility] ORS;  Service: Cardiovascular;  Laterality: N/A;   CARDIOVERSION N/A 11/08/2021   Procedure: CARDIOVERSION;  Surgeon: Antonieta Iba,  MD;  Location: [facility] ORS;  Service: Cardiovascular;  Laterality: N/A;   CESAREAN SECTION     x 3   CLIPPING OF ATRIAL APPENDAGE N/A 03/06/2022   Procedure: CLIPPING OF ATRIAL APPENDAGE USING ATRICURE  CLIP SIZE ;  Surgeon: Loreli Slot, MD;  Location: [facility] OR;  Service: Open Heart Surgery;  Laterality: N/A;   EXCISION OF ATRIAL MYXOMA N/A 03/06/2022   Procedure: RESECTION OF AORTIC VALVE TUMOR;  Surgeon: Loreli Slot, MD;  Location: [facility] OR;  Service: Open Heart Surgery;  Laterality: N/A;   HERNIA REPAIR     x2 one was for sure a hiatal hernia   ICD IMPLANT N/A 12/15/2022   Procedure: ICD IMPLANT;   Surgeon: Regan Lemming, MD;  Location: [facility] INVASIVE CV LAB;  Service: Cardiovascular;  Laterality: N/A;   IR ANGIO EXTRACRAN SEL COM CAROTID INNOMINATE UNI L MOD SED  11/13/2021   IR CT HEAD LTD  11/13/2021   IR PERCUTANEOUS ART THROMBECTOMY/INFUSION INTRACRANIAL INC DIAG ANGIO  11/13/2021   IR THORACENTESIS ASP PLEURAL SPACE W/IMG GUIDE  03/14/2022   LAPAROSCOPIC GASTRIC SLEEVE RESECTION  2020   LAPAROSCOPIC GASTRIC SLEEVE RESECTION WITH HIATAL HERNIA REPAIR  08/17/2015   Procedure: LAPAROSCOPIC GASTRIC SLEEVE RESECTION WITH HIATAL HERNIA REPAIR;  Surgeon: Luretha Murphy, MD;  Location: [facility] ORS;  Service: General;;   LEFT HEART CATH AND CORONARY ANGIOGRAPHY N/A 10/30/2022   Procedure: LEFT HEART CATH AND CORONARY ANGIOGRAPHY;  Surgeon: Iran Ouch, MD;  Location: [facility] INVASIVE CV LAB;  Service: Cardiovascular;  Laterality: N/A;   MAZE N/A 03/06/2022   Procedure: MAZE;  Surgeon: Loreli Slot, MD;  Location: [facility] OR;  Service: Open Heart Surgery;  Laterality: N/A;   RADIOLOGY WITH ANESTHESIA N/A 11/12/2021   Procedure: IR WITH ANESTHESIA;  Surgeon: Julieanne Cotton, MD;  Location: [facility] OR;  Service: Radiology;  Laterality: N/A;   TEE WITHOUT CARDIOVERSION N/A 08/11/2020   Procedure: TRANSESOPHAGEAL ECHOCARDIOGRAM (TEE);  Surgeon: Regan Lemming, MD;  Location: [facility] INVASIVE CV LAB;  Service: Cardiovascular;  Laterality: N/A;   TEE WITHOUT CARDIOVERSION N/A 11/08/2021   Procedure: TRANSESOPHAGEAL ECHOCARDIOGRAM (TEE);  Surgeon: Antonieta Iba, MD;  Location: [facility] ORS;  Service: Cardiovascular;  Laterality: N/A;   TEE WITHOUT CARDIOVERSION N/A 03/06/2022   Procedure: TRANSESOPHAGEAL ECHOCARDIOGRAM (TEE);  Surgeon: Loreli Slot, MD;  Location: [facility] OR;  Service: Open Heart Surgery;  Laterality: N/A;   TUBAL LIGATION     x 2   UPPER GI ENDOSCOPY  08/17/2015   Procedure: UPPER GI ENDOSCOPY;  Surgeon: Luretha Murphy, MD;  Location: [facility] ORS;  Service: General;;    MEDICATIONS: Prior to Admission medications   Medication Sig Start Date End Date Taking? Authorizing Provider  apixaban (ELIQUIS) 5 MG TABS tablet TAKE 1 TABLET(5 MG) BY MOUTH TWICE DAILY 10/19/22   Camnitz, Andree Coss, MD  aspirin EC 81 MG tablet Take 1 tablet (81 mg total) by mouth daily. Swallow whole. 11/01/22   Willeen Niece, MD  b complex vitamins capsule Take 1 capsule by mouth daily.    [provider]  cyclobenzaprine (FLEXERIL) 10 MG tablet Take 10 mg by mouth at bedtime.    [provider]  furosemide (LASIX) 40 MG tablet Take 0.5 tablets (20 mg total) by mouth daily. Take an additional  tablet as needed Patient taking differently: Take 40 mg by mouth daily. 11/06/22   Sherie Don, NP  gabapentin (NEURONTIN) 100 MG capsule Take 100 mg by mouth at bedtime.  [provider]  midodrine (PROAMATINE) 5 MG tablet Take 3 tablets (15 mg total) by mouth 2 (two) times daily with a meal. 10/19/22   Camnitz, Andree Coss, MD  Omega-3 Fatty Acids (FISH OIL) 1000 MG CAPS Take 100 mg by mouth daily.    [provider]  pantoprazole (PROTONIX) 20 MG tablet Take 20 mg by mouth daily.    [provider]  potassium chloride SA (KLOR-CON M20) 20 MEQ tablet Take 1 tablet once daily, with Lasix, for 7 days Patient taking differently: Take 20 mEq by mouth daily. 04/21/22   Camnitz, Will Daphine Deutscher, MD  pravastatin (PRAVACHOL) 20 MG tablet Take 20 mg by mouth daily.    [provider]  rOPINIRole (REQUIP) 0.5 MG tablet Take 0.5 mg by mouth at bedtime.    [provider]    apixaban  5 mg Oral BID   [START ON 01/02/2023] aspirin EC  81 mg Oral Daily   [START ON 01/02/2023] nicotine  21 mg Transdermal Daily   pantoprazole (PROTONIX) IV  40 mg Intravenous Q12H   sodium chloride flush  3 mL Intravenous Q12H     magnesium sulfate bolus IVPB 2 g (01/01/23 2145)   ED Course: Pt in Ed Is alert/ awake and oriented / NAD. Vitals:   01/01/23 1954  01/01/23 1955 01/01/23 2142  BP:  109/75 102/73  Pulse: (!) 102 98 98  Temp:  98.3 F (36.8 C) 98 F (36.7 C)  TempSrc:  Oral Oral  SpO2: 100% 100% 99%  Weight:  65.8 kg   Height:  5\' 4"  (1.626 m)    No intake/output data recorded. SpO2: 99 % Blood work in ed shows: EKG shows A.fib at 99.  EDMD d/w case with on call cardiology Dr. Jones Broom who recommended pt be admitted and if the NSVT happens again to start pt on amiodarone.  CHR and labs are normal except for mild anemia that is new.  "He recommends against initiating IV amiodarone unless patient were to have a second treated episode of VT. He states that patient may be admitted to [facility] for observation and evaluation by cardiology in the morning, at which point cardiology may coordinate with her EP team at [facility]."  Results for orders placed or performed during the hospital encounter of 01/01/23 (from the past 72 hour(s))  CBC with Differential     Status: Abnormal   Collection Time: 01/01/23  7:59 PM  Result Value Ref Range   WBC 7.0 4.0 - 10.5 K/uL   RBC 3.78 (L) 3.87 - 5.11 MIL/uL   Hemoglobin 11.2 (L) 12.0 - 15.0 g/dL   HCT 16.1 (L) 09.6 - 04.5 %   MCV 90.7 80.0 - 100.0 fL   MCH 29.6 26.0 - 34.0 pg   MCHC 32.7 30.0 - 36.0 g/dL   RDW 40.9 (H) 81.1 - 91.4 %   Platelets 303 150 - 400 K/uL   nRBC 0.0 0.0 - 0.2 %   Neutrophils Relative % 56 %   Neutro Abs 3.9 1.7 - 7.7 K/uL   Lymphocytes Relative 28 %   Lymphs Abs 2.0 0.7 - 4.0 K/uL   Monocytes Relative 8 %   Monocytes Absolute 0.6 0.1 - 1.0 K/uL   Eosinophils Relative 5 %   Eosinophils Absolute 0.3 0.0 - 0.5 K/uL   Basophils Relative 3 %   Basophils Absolute 0.2 (H) 0.0 - 0.1 K/uL   Immature Granulocytes 0 %   Abs Immature Granulocytes 0.03 0.00 - 0.07  K/uL    Comment: Performed at [facility], [address] [place], [place] [address]  Comprehensive metabolic panel     Status: Abnormal   Collection Time: 01/01/23  7:59 PM  Result Value Ref Range    Sodium 141 135 - 145 mmol/L   Potassium 3.5 3.5 - 5.1 mmol/L   Chloride 106 98 - 111 mmol/L   CO2 26 22 - 32 mmol/L   Glucose, Bld 95 70 - 99 mg/dL    Comment: Glucose reference range applies only to samples taken after fasting for at least 8 hours.   BUN 23 (H) 6 - 20 mg/dL   Creatinine, Ser 6.04 (H) 0.44 - 1.00 mg/dL   Calcium 8.9 8.9 - 54.0 mg/dL   Total Protein 6.9 6.5 - 8.1 g/dL   Albumin 4.1 3.5 - 5.0 g/dL   AST 28 15 - 41 U/L   ALT 23 0 - 44 U/L   Alkaline Phosphatase 83 38 - 126 U/L   Total Bilirubin 0.6 0.3 - 1.2 mg/dL   GFR, Estimated >98 >11 mL/min    Comment: (NOTE) Calculated using the CKD-EPI Creatinine Equation (2021)    Anion gap 9 5 - 15    Comment: Performed at [facility], [address] [place], [place] [address]  Troponin I (High Sensitivity)     Status: Abnormal   Collection Time: 01/01/23  7:59 PM  Result Value Ref Range   Troponin I (High Sensitivity) 41 (H) <18 ng/L    Comment: (NOTE) Elevated high sensitivity troponin I (hsTnI) values and significant  changes across serial measurements may suggest ACS but many other  chronic and acute conditions are known to elevate hsTnI results.  Refer to the "Links" section for chest pain algorithms and additional  guidance. Performed at [facility], [address] [place], [place] [address]   Magnesium     Status: None   Collection Time: 01/01/23  7:59 PM  Result Value Ref Range   Magnesium 2.1 1.7 - 2.4 mg/dL    Comment: Performed at [facility], [address] [place], [place] [address]    Lab Results  Component Value Date   CREATININE 1.02 (H) 01/01/2023   CREATININE 0.91 12/12/2022   CREATININE 0.94 11/06/2022      Latest Ref Rng & Units 01/01/2023    7:59 PM 12/12/2022   10:32 AM 11/06/2022    9:28 AM  CMP  Glucose 70 - 99 mg/dL 95  85  88   BUN 6 - 20 mg/dL 23  24  17    Creatinine 0.44 - 1.00 mg/dL 5.78  4.69  6.29   Sodium 135 - 145 mmol/L 141  142  138    Potassium 3.5 - 5.1 mmol/L 3.5  3.8  4.2   Chloride 98 - 111 mmol/L 106  103  100   CO2 22 - 32 mmol/L 26  26  24    Calcium 8.9 - 10.3 mg/dL 8.9  9.4  9.8   Total Protein 6.5 - 8.1 g/dL 6.9     Total Bilirubin 0.3 - 1.2 mg/dL 0.6     Alkaline Phos 38 - 126 U/L 83     AST 15 - 41 U/L 28     ALT 0 - 44 U/L 23      Unresulted Labs (From admission, onward)     Start     Ordered   01/02/23 0500  Comprehensive metabolic panel  Tomorrow morning,   STAT  01/01/23 2208   01/02/23 0500  CBC  Tomorrow morning,   STAT        01/01/23 2208   01/01/23 2209  T4, free  Once,   URGENT        01/01/23 2208   01/01/23 2208  TSH  Once,   URGENT        01/01/23 2208   01/01/23 2202  HIV Antibody (routine testing w rflx)  (HIV Antibody (Routine testing w reflex) panel)  Once,   URGENT        01/01/23 2208           Pt has received : Orders Placed This Encounter  Procedures   Critical Care    This order was created via procedure documentation    Standing Status:   Standing    Number of Occurrences:   1   DG Chest Portable 1 View    Standing Status:   Standing    Number of Occurrences:   1    Order Specific Question:   Reason for Exam (SYMPTOM  OR DIAGNOSIS REQUIRED)    Answer:   CP    Order Specific Question:   Is patient pregnant?    Answer:   No   CBC with Differential    Standing Status:   Standing    Number of Occurrences:   1   Comprehensive metabolic panel    Standing Status:   Standing    Number of Occurrences:   1   Magnesium    Standing Status:   Standing    Number of Occurrences:   1   HIV Antibody (routine testing w rflx)    Standing Status:   Standing    Number of Occurrences:   1   Comprehensive metabolic panel    Standing Status:   Standing    Number of Occurrences:   1   CBC    Standing Status:   Standing    Number of Occurrences:   1   TSH    Standing Status:   Standing    Number of Occurrences:   1   T4, free    Standing Status:   Standing    Number  of Occurrences:   1   Diet NPO time specified Except for: Sips with Meds    Standing Status:   Standing    Number of Occurrences:   1    Order Specific Question:   Except for    Answer:   Sips with Meds   Maintain IV access    Standing Status:   Standing    Number of Occurrences:   1   Vital signs    Standing Status:   Standing    Number of Occurrences:   1   Notify physician (specify)    Standing Status:   Standing    Number of Occurrences:   20    Order Specific Question:   Notify Physician    Answer:   for pulse less than 55 or greater than 120    Order Specific Question:   Notify Physician    Answer:   for respiratory rate less than 12 or greater than 25    Order Specific Question:   Notify Physician    Answer:   for temperature greater than 100.5 F    Order Specific Question:   Notify Physician    Answer:   for urinary output less than 30 mL/hr for four hours  Order Specific Question:   Notify Physician    Answer:   for systolic BP less than 90 or greater than 160, diastolic BP less than 60 or greater than 100    Order Specific Question:   Notify Physician    Answer:   for new hypoxia w/ oxygen saturations < 88%   Progressive Mobility Protocol: No Restrictions    Standing Status:   Standing    Number of Occurrences:   1   Daily weights    Standing Status:   Standing    Number of Occurrences:   1   Intake and Output    Standing Status:   Standing    Number of Occurrences:   1   Do not place and if present remove PureWick    Standing Status:   Standing    Number of Occurrences:   1   Initiate Oral Care Protocol    Standing Status:   Standing    Number of Occurrences:   1   Initiate Carrier Fluid Protocol    Standing Status:   Standing    Number of Occurrences:   1   RN may order General Admission PRN Orders utilizing "General Admission PRN medications" (through manage orders) for the following patient needs: allergy symptoms (Claritin), cold sores (Carmex), cough  (Robitussin DM), eye irritation (Liquifilm Tears), hemorrhoids (Tucks), indigestion (Maalox), minor skin irritation (Hydrocortisone Cream), muscle pain (Ben Gay), nose irritation (saline nasal spray) and sore throat (Chloraseptic spray).    Standing Status:   Standing    Number of Occurrences:   [phone]   Cardiac Monitoring - Continuous Indefinite    Standing Status:   Standing    Number of Occurrences:   1    Order Specific Question:   Indications for use:    Answer:   ICU/Stepdown patient   Full code    Standing Status:   Standing    Number of Occurrences:   1    Order Specific Question:   By:    Answer:   Other   Consult to hospitalist    Standing Status:   Standing    Number of Occurrences:   1    Order Specific Question:   Place call to:    Answer:   [phone]    Order Specific Question:   Reason for Consult    Answer:   Admit   Inpatient consult to Cardiology [facility] only - Select consulting group: [facility]; Consult Timeframe: ROUTINE - requires response within 24 hours; Reason for Consult? AICD firing/ NSVT/ A.FIB.    Standing Status:   Standing    Number of Occurrences:   1    Order Specific Question:   [facility] only - Select consulting group    Answer:   [facility]    Order Specific Question:   Consult Timeframe    Answer:   ROUTINE - requires response within 24 hours    Order Specific Question:   Reason for Consult?    Answer:   AICD firing/ NSVT/ A.FIB.   Pulse oximetry check with vital signs    Standing Status:   Standing    Number of Occurrences:   1   Oxygen therapy Mode or (Route): Nasal cannula; Liters Per Minute: 2; Keep 02 saturation: greater than 92 %    Standing Status:   Standing    Number of Occurrences:   20    Order Specific Question:   Mode or (Route)    Answer:  Nasal cannula    Order Specific Question:   Liters Per Minute    Answer:   2    Order Specific Question:   Keep 02 saturation    Answer:   greater than 92 %   ED EKG    Standing Status:   Standing    Number  of Occurrences:   1    Order Specific Question:   Reason for Exam    Answer:   Syncope   EKG 12-Lead    Standing Status:   Standing    Number of Occurrences:   1   EKG 12-Lead    Standing Status:   Standing    Number of Occurrences:   1   Admit to Inpatient (patient's expected length of stay will be greater than 2 midnights or inpatient only procedure)    Standing Status:   Standing    Number of Occurrences:   1    Order Specific Question:   Hospital Area    Answer:   [facility] [[id number]]    Order Specific Question:   Level of Care    Answer:   Stepdown [14]    Order Specific Question:   Covid Evaluation    Answer:   Asymptomatic - no recent exposure (last 10 days) testing not required    Order Specific Question:   Diagnosis    Answer:   AICD discharge [722326]    Order Specific Question:   Admitting Physician    Answer:   Darrold Junker    Order Specific Question:   Attending Physician    Answer:   Darrold Junker    Order Specific Question:   Certification:    Answer:   I certify this patient will need inpatient services for at least 2 midnights    Order Specific Question:   Estimated Length of Stay    Answer:   2   Fall precautions    Standing Status:   Standing    Number of Occurrences:   1   Aspiration precautions    Standing Status:   Standing    Number of Occurrences:   1    Meds ordered this encounter  Medications   magnesium sulfate IVPB 2 g 50 mL   potassium chloride SA (KLOR-CON M) CR tablet 40 mEq   sodium chloride flush (NS) 0.9 % injection 3 mL   OR Linked Order Group    acetaminophen (TYLENOL) tablet 650 mg    acetaminophen (TYLENOL) suppository 650 mg   nicotine (NICODERM CQ - dosed in mg/24 hours) patch 21 mg   apixaban (ELIQUIS) tablet 5 mg    TAKE 1 TABLET(5 MG) BY MOUTH TWICE DAILY     aspirin EC tablet 81 mg    Swallow whole.     pantoprazole (PROTONIX) injection 40 mg    Admission Imaging : DG Chest Portable  1 View  Result Date: 01/01/2023 CLINICAL DATA:  Chest pain EXAM: PORTABLE CHEST 1 VIEW COMPARISON:  12/15/2022 FINDINGS: Lungs are clear. No pneumothorax or pleural effusion. Cardiac size within normal limits. Median sternotomy and left atrial clipping has been performed. Left subclavian dual lead pacemaker defibrillator is unchanged. Pulmonary vascularity is normal. No acute bone abnormality. IMPRESSION: 1. No active disease. Electronically Signed   By: Helyn Numbers M.D.   On: 01/01/2023 20:34   CUP PACEART INCLINIC DEVICE CHECK  Result Date: 01/01/2023 Patient seen in device clinic for programming directed by Dr. Graciela Husbands. Please  see changes below. Error in saving final attachent due to having to use 2 ipads due to app interruption. Added VT Zone at 175 bpm, w/ ATP X3 @ 84%, NID changed from 16 to 56. Changed VT-Mon zone from 171 bpm to 140bpm. Changed VF Zone from 207 bpm to 222 bpm. Shari Prows, MDT Rep present for assistance.  Physical Examination: Vitals:   01/01/23 1954 01/01/23 1955 01/01/23 2142  BP:  109/75 102/73  Pulse: (!) 102 98 98  Temp:  98.3 F (36.8 C) 98 F (36.7 C)  Height:  5\' 4"  (1.626 m)   Weight:  65.8 kg   SpO2: 100% 100% 99%  TempSrc:  Oral Oral  BMI (Calculated):  24.88    Physical Exam Vitals and nursing note reviewed.  Constitutional:      General: She is not in acute distress.    Appearance: Normal appearance. She is not ill-appearing, toxic-appearing or diaphoretic.  HENT:     Head: Normocephalic and atraumatic.     Right Ear: Hearing and external ear normal.     Left Ear: Hearing and external ear normal.     Nose: Nose normal. No nasal deformity.     Mouth/Throat:     Lips: Pink.     Mouth: Mucous membranes are moist.     Tongue: No lesions.     Pharynx: Oropharynx is clear.  Eyes:     Extraocular Movements: Extraocular movements intact.  Neck:     Vascular: No carotid bruit.  Cardiovascular:     Rate and Rhythm: Normal rate and regular rhythm.      Pulses: Normal pulses.     Heart sounds: Normal heart sounds.  Pulmonary:     Effort: Pulmonary effort is normal.     Breath sounds: Normal breath sounds.  Abdominal:     General: Bowel sounds are normal. There is no distension.     Palpations: Abdomen is soft. There is no mass.     Tenderness: There is no abdominal tenderness. There is no guarding.     Hernia: No hernia is present.  Musculoskeletal:     Right lower leg: No edema.     Left lower leg: No edema.  Skin:    General: Skin is warm.  Neurological:     General: No focal deficit present.     Mental Status: She is alert and oriented to person, place, and time.     Cranial Nerves: Cranial nerves 2-12 are intact.     Motor: Motor function is intact.  Psychiatric:        Attention and Perception: Attention normal.        Mood and Affect: Mood normal.        Speech: Speech normal.        Behavior: Behavior normal. Behavior is cooperative.        Cognition and Memory: Cognition normal.     Assessment and Plan: * AICD discharge Patient is followed by EP cardiology at [facility]. Discussed case with ED MD who also discussed it with cardiology that any changes to the device can be managed over the telephone and by the cardiology group here and recommended admission here. Will admit patient to stepdown unit with cardiac monitoring and keep patient NPO. Cardiology consulted Dr. Gala Romney is on-call for [facility] health cardiology. Will continue patient on her current medications.    Persistent atrial fibrillation Patient status post maze procedure. Patient is currently on Eliquis. Patient is not on any rate controlling  agent secondary to hypotension. I suspect patient will need initiation of amiodarone therapy. Per cardiology recommendations we will monitor patient in the event that she develops another nonsustained V. tach will start patient on amiodarone.  NSVT (nonsustained ventricular tachycardia) Will continue patient on  apixaban.   Chronic diastolic CHF (congestive heart failure) Pt has low blood pressure and is on midodrine with lasix.  We will continue eliquis.    Smoking Counseled patient on smoking complications of arthritis with smoking , patient states she is working on quitting. Nicotine patch.  Iron deficiency anemia Discussed with patient about avoiding NSAIDs. 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  Discussed with patient about following up with her primary care and monitoring her hemoglobin in the event it gets worse and to may need iron infusions. Refrain from using BC powders Goody powders. IV PPI therapy.   DVT prophylaxis:  Eliquis Code Status:  Full code    01/01/2023    7:57 PM  Advanced Directives  Does Patient Have a Medical Advance Directive? No    Family Communication:  Spouse at bedside Emergency Contact: Contact Information     Name Relation Home Work [place] Daughter   [phone]   [facility] Daughter [phone]     [place], [place] [phone]  [phone]   Patricia Pesa Significant other   [phone]       Disposition Plan:  Home Consults: Cardiology Admission status: Inpatient Unit / Expected LOS: Stepdown unit 1 to 2 days  Gertha Calkin MD [facility]  6 PM- 2 AM. [phone]( Pager )  For questions regarding this patient please use [email or web address] to contact the current [facility] MD.   Bonita Quin may also call [phone] to contact current Assigned [facility] Attending/Consulting MD for this patient.

## 2023-01-01 NOTE — ED Triage Notes (Signed)
Pt. To ED for defib/pacemaker firing today. Pt. States she walked into the store and felt palpitations start and defib shocked her. Pt. States defib was placed this March, and was reprogrammed today. 324 ASA via medic. Pt. Denies recent illness, states has been having palpitations intermittently this weekend. BGL 97.

## 2023-01-01 NOTE — Assessment & Plan Note (Signed)
Will continue patient on apixaban.

## 2023-01-02 ENCOUNTER — Encounter: Payer: Self-pay | Admitting: Internal Medicine

## 2023-01-02 DIAGNOSIS — I4729 Other ventricular tachycardia: Secondary | ICD-10-CM

## 2023-01-02 DIAGNOSIS — I4819 Other persistent atrial fibrillation: Secondary | ICD-10-CM

## 2023-01-02 DIAGNOSIS — Z4502 Encounter for adjustment and management of automatic implantable cardiac defibrillator: Secondary | ICD-10-CM | POA: Diagnosis not present

## 2023-01-02 DIAGNOSIS — I472 Ventricular tachycardia, unspecified: Secondary | ICD-10-CM

## 2023-01-02 LAB — COMPREHENSIVE METABOLIC PANEL
ALT: 20 U/L (ref 0–44)
AST: 25 U/L (ref 15–41)
Albumin: 3.8 g/dL (ref 3.5–5.0)
Alkaline Phosphatase: 89 U/L (ref 38–126)
Anion gap: 8 (ref 5–15)
BUN: 21 mg/dL — ABNORMAL HIGH (ref 6–20)
CO2: 28 mmol/L (ref 22–32)
Calcium: 9.2 mg/dL (ref 8.9–10.3)
Chloride: 105 mmol/L (ref 98–111)
Creatinine, Ser: 0.96 mg/dL (ref 0.44–1.00)
GFR, Estimated: >60 mL/min (ref >60)
Glucose, Bld: 84 mg/dL (ref 70–99)
Potassium: 4.1 mmol/L (ref 3.5–5.1)
Sodium: 141 mmol/L (ref 135–145)
Total Bilirubin: 0.8 mg/dL (ref 0.3–1.2)
Total Protein: 6.6 g/dL (ref 6.5–8.1)

## 2023-01-02 LAB — CBC
HCT: 35.4 % — ABNORMAL LOW (ref 36.0–46.0)
Hemoglobin: 11.4 g/dL — ABNORMAL LOW (ref 12.0–15.0)
MCH: 29.4 pg (ref 26.0–34.0)
MCHC: 32.2 g/dL (ref 30.0–36.0)
MCV: 91.2 fL (ref 80.0–100.0)
Platelets: 283 10*3/uL (ref 150–400)
RBC: 3.88 MIL/uL (ref 3.87–5.11)
RDW: 18 % — ABNORMAL HIGH (ref 11.5–15.5)
WBC: 5.7 10*3/uL (ref 4.0–10.5)
nRBC: 0 % (ref 0.0–0.2)

## 2023-01-02 LAB — MAGNESIUM: Magnesium: 2.5 mg/dL — ABNORMAL HIGH (ref 1.7–2.4)

## 2023-01-02 LAB — GLUCOSE, CAPILLARY: Glucose-Capillary: 82 mg/dL (ref 70–99)

## 2023-01-02 LAB — HIV ANTIBODY (ROUTINE TESTING W REFLEX): HIV Screen 4th Generation wRfx: NONREACTIVE

## 2023-01-02 MED ORDER — AMIODARONE LOAD VIA INFUSION
150.0000 mg | Freq: Once | INTRAVENOUS | Status: AC
  Administered 2023-01-02: 150 mg via INTRAVENOUS
  Filled 2023-01-02: qty 83.34

## 2023-01-02 MED ORDER — AMIODARONE HCL IN DEXTROSE 360-4.14 MG/200ML-% IV SOLN
30.0000 mg/h | INTRAVENOUS | Status: AC
  Administered 2023-01-02 (×2): 30 mg/h via INTRAVENOUS
  Filled 2023-01-02: qty 200

## 2023-01-02 MED ORDER — AMIODARONE HCL 200 MG PO TABS
400.0000 mg | ORAL_TABLET | Freq: Two times a day (BID) | ORAL | Status: DC
  Administered 2023-01-02 – 2023-01-04 (×5): 400 mg via ORAL
  Filled 2023-01-02 (×6): qty 2

## 2023-01-02 MED ORDER — AMIODARONE HCL IN DEXTROSE 360-4.14 MG/200ML-% IV SOLN
60.0000 mg/h | INTRAVENOUS | Status: AC
  Administered 2023-01-02 (×2): 60 mg/h via INTRAVENOUS
  Filled 2023-01-02 (×2): qty 200

## 2023-01-02 MED ORDER — PANTOPRAZOLE SODIUM 40 MG PO TBEC
40.0000 mg | DELAYED_RELEASE_TABLET | Freq: Two times a day (BID) | ORAL | Status: DC
  Administered 2023-01-02 – 2023-01-04 (×5): 40 mg via ORAL
  Filled 2023-01-02 (×5): qty 1

## 2023-01-02 MED ORDER — ORAL CARE MOUTH RINSE: 15.0000 mL | OROMUCOSAL | Status: DC | PRN

## 2023-01-02 NOTE — Progress Notes (Signed)
Progress Note    [name]  ZOX:[id number] DOB: 11-Feb-1971  DOA: 01/01/2023 PCP: Wilford Corner, PA-C      Brief Narrative:    Medical records reviewed and are as summarized below:  Hannah Zimmerman is a 52 y.o. 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  She says she fell to the ground when she experienced ICD discharge but she did not pass out.  She was seen at the EP clinic on the day of admission after her ICD showed accelerated junctional rhythm when she went for routine wound check on 12/27/2022 following ICD implant.  She was evaluated by the EP cardiologist.  She was found to have SVT and NSVT.  IV amiodarone load followed by IV amiodarone infusion was recommended.      Assessment/Plan:   Principal Problem:   AICD discharge Active Problems:   Persistent atrial fibrillation   Chronic diastolic CHF (congestive heart failure)   NSVT (nonsustained ventricular tachycardia)   Smoking   Iron deficiency anemia   Body mass index is 24.9 kg/m.   AICD discharge, SVT, NSVT: This was given IV amiodarone bolus.  Continue IV amiodarone infusion for 24 hours followed by oral amiodarone 400 mg twice daily per EF cardiologist.   Persistent atrial fibrillation: CHA2DS2-VASc score is 4.  Continue amiodarone and Eliquis.   Chronic diastolic CHF: Compensated.  EF on 2D echo in February 2024 was 65 to 70%.   Other comorbidities include iron deficiency anemia, tobacco use disorder, s/p gastric bypass     Diet Order             Diet regular Room service appropriate? Yes; Fluid consistency: Thin  Diet effective now                             Consultants: EP cardiologist  Procedures: None    Medications:    amiodarone  400 mg Oral BID   apixaban  5 mg Oral BID   aspirin EC  81 mg Oral Daily   nicotine  21 mg Transdermal Daily   pantoprazole  40 mg Oral BID   sodium chloride flush  3 mL Intravenous Q12H   Continuous Infusions:  amiodarone 60 mg/hr (01/02/23 1500)   Followed by   amiodarone       Anti-infectives (From admission, onward)    None              Family Communication/Anticipated D/C date and plan/Code Status   DVT prophylaxis:  apixaban (ELIQUIS) tablet 5 mg     Code Status: Full Code  Family Communication: None Disposition Plan: Plan to discharge home tomorrow   Status is: Inpatient Remains inpatient appropriate because: AICD discharge on IV amiodarone infusion       Subjective:   Interval events noted.  She has no complaints.  She feels better.  No palpitations, chest pain or shortness of breath  Objective:    Vitals:   01/02/23 1200 01/02/23 1300 01/02/23 1400 01/02/23 1500  BP: 97/62  Pulse: (!) 58 61 (!) 58 (!) 59  Resp:   20 14  Temp:   97.9 F (36.6 C)  TempSrc:   Oral   SpO2: 94% 100% 100% 100%  Weight:      Height:       No data found.   Intake/Output Summary (Last 24 hours) at 01/02/2023 1539 Last data filed at 01/02/2023 1500 Gross per 24 hour  Intake 324.56 ml  Output --  Net 324.56 ml   Filed Weights   01/01/23 1955 01/02/23 0326  Weight: 65.8 kg 65.8 kg    Exam:  GEN: NAD SKIN: Warm and dry EYES: No pallor or icterus ENT: MMM CV: RRR PULM: CTA B ABD: soft, ND, NT, +BS CNS: AAO x 3, non focal EXT: No edema or tenderness        Data Reviewed:   I have personally reviewed following labs and imaging studies:  Labs: Labs show the following:   Basic Metabolic Panel: Recent Labs  Lab 01/01/23 1959 01/02/23 0549  NA 141 141  K 3.5 4.1  CL 106 105  CO2 26 28  GLUCOSE 95 84  BUN  23* 21*  CREATININE 1.02* 0.96  CALCIUM 8.9 9.2  MG 2.1 2.5*   GFR Estimated Creatinine Clearance: 64.7 mL/min (by C-G formula based on SCr of 0.96 mg/dL). Liver Function Tests: Recent Labs  Lab 01/01/23 1959 01/02/23 0549  AST 28 25  ALT 23 20  ALKPHOS 83 89  BILITOT 0.6 0.8  PROT 6.9 6.6  ALBUMIN 4.1 3.8   No results for input(s): "LIPASE", "AMYLASE" in the last 168 hours. No results for input(s): "AMMONIA" in the last 168 hours. Coagulation profile No results for input(s): "INR", "PROTIME" in the last 168 hours.  CBC: Recent Labs  Lab 01/01/23 1959 01/02/23 0549  WBC 7.0 5.7  NEUTROABS 3.9  --   HGB 11.2* 11.4*  HCT 34.3* 35.4*  MCV 90.7 91.2  PLT 303 283   Cardiac Enzymes: No results for input(s): "CKTOTAL", "CKMB", "CKMBINDEX", "TROPONINI" in the last 168 hours. BNP (last 3 results) No results for input(s): "PROBNP" in the last 8760 hours. CBG: Recent Labs  Lab 01/01/23 2304  GLUCAP 82   D-Dimer: No results for input(s): "DDIMER" in the last 72 hours. Hgb A1c: No results for input(s): "HGBA1C" in the last 72 hours. Lipid Profile: No results for input(s): "CHOL", "HDL", "LDLCALC", "TRIG", "CHOLHDL", "LDLDIRECT" in the last 72 hours. Thyroid function studies: Recent Labs    01/01/23 1959  TSH 2.072   Anemia work up: No results for input(s): "VITAMINB12", "FOLATE", "FERRITIN", "TIBC", "IRON", "RETICCTPCT" in the last 72 hours. Sepsis Labs: Recent Labs  Lab 01/01/23 1959 01/02/23 0549  WBC 7.0 5.7    Microbiology No results found for this or any previous visit (from the past 240 hour(s)).  Procedures and diagnostic studies:  DG Chest Portable 1 View  Result Date: 01/01/2023 CLINICAL DATA:  Chest pain EXAM: PORTABLE CHEST 1 VIEW COMPARISON:  12/15/2022 FINDINGS: Lungs are clear. No pneumothorax or pleural effusion. Cardiac size within normal limits. Median sternotomy and left atrial clipping has been performed. Left subclavian dual lead  pacemaker defibrillator is unchanged. Pulmonary vascularity is normal. No acute bone abnormality. IMPRESSION: 1. No active disease. Electronically Signed   By: Helyn Numbers M.D.   On: 01/01/2023 20:34   CUP PACEART INCLINIC DEVICE CHECK  Result Date: 01/01/2023 Patient seen in device clinic for programming directed by Dr. Graciela Husbands. Please see changes below. Error in saving final attachent due to having to use 2 ipads due to app interruption. Added VT Zone at 175 bpm, w/ ATP X3 @  84%, NID changed from 16 to 56. Changed VT-Mon zone from 171 bpm to 140bpm. Changed VF Zone from 207 bpm to 222 bpm. Shari Prows, MDT Rep present for assistance.              LOS: 1 day   Morey Andonian  [facility]   Pager on [email or web address]. If 7PM-7AM, please contact night-coverage at [email or web address]     01/02/2023, 3:39 PM

## 2023-01-02 NOTE — Progress Notes (Signed)
PHARMACIST - PHYSICIAN COMMUNICATION  CONCERNING: IV to Oral Route Change Policy  RECOMMENDATION: This patient is receiving pantoprazole by the intravenous route.  Based on criteria approved by the Pharmacy and Therapeutics Committee, the intravenous medication(s) is/are being converted to the equivalent oral dose form(s).  DESCRIPTION: These criteria include: The patient is eating (either orally or via tube) and/or has been taking other orally administered medications for a least 24 hours The patient has no evidence of active gastrointestinal bleeding or impaired GI absorption (gastrectomy, short bowel, patient on TNA or NPO).  If you have questions about this conversion, please contact the Pharmacy Department   Tressie Ellis, [facility] 01/02/2023 8:44 AM

## 2023-01-02 NOTE — Consult Note (Addendum)
ELECTROPHYSIOLOGY CONSULT NOTE    Patient ID: Hannah Zimmerman MRN: [id number], DOB/AGE: Oct 03, 1970 52 y.o.  Admit date: 01/01/2023 Date of Consult: 01/02/2023  Primary Physician: Wilford Corner, PA-C Primary Cardiologist: Julien Nordmann, MD  Electrophysiologist: Dr. [name]   Referring Provider: Dr. Myriam Forehand  Patient Profile: Hannah Zimmerman is a 52 y.o. 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 Myriam Forehand.  AAD history - flecainide, stopped d/t headaches Amiodarone stopped 02/2022 s/p CT surgery d/t SND, bradycardia   HPI:  Hannah Zimmerman is a 52 y.o. female with above PMH.   She was seen in device clinic 4/10 for routine wound check after ICD implant 3/29. . Was found to be in an accelerated junctional rhythm at 95-100.  After the appointment, she was having palpitations and hot flashes and called the clinic. She sent in a manual transmission that shows high likelihood of NSVT. She had a death in the family and was taking hydroxyzine 4/day.  She was seen in EP clinic yesterday for further evaluation with the following changes made:  Added VT Zone at 175 bpm, w/ ATP X3 @ 84%, NID changed from 16 to 56. Changed VT-Mon zone from 171 bpm to 140bpm. Changed VF Zone from 207 bpm to 222 bpm   Yesterday evening she was walking into a store, felt palpitations and presyncope and then was shocked > fall, no LOC.  She is planning to get married this Saturday   Currently, she denies chest pain, palpitations, dyspnea, PND, orthopnea, nausea, vomiting, dizziness, syncope, edema, weight gain, or early satiety.   Labs Potassium4.1 (04/16 0549) 3.5 on admission Magnesium  2.1 (04/15 1959) Creatinine, ser  0.96  (04/16 0549) PLT  283 (04/16 0549) HGB  11.4* (04/16 0549) WBC 5.7 (04/16 0549) Troponin I (High Sensitivity)38* (04/15 2150).    Past Medical History:  Diagnosis Date   (HFpEF) heart failure with preserved ejection fraction    a. 07/2019 Echo: EF 60-65%, mod LVH. Sev dil LA, mildly dil RA. Mod elev PASP; b. 07/2020 TEE: EF 50-55%, no rwma, sev conc LVH. Nl RV fxn. Mod dil RA. Mild-mod MR, mod TR.   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 07/2020 Cardiac CT: Cor Ca2+ = 338 (99th %'ile).   Complication of anesthesia 20 years ago   a. spinal with first c section went too high stopped breathing, low bp with 2nd c section, no further issues with anesthesia   Heart murmur    a. 07/2019 Echo: no significant valvular dzs; b. 07/2020 TEE: Mild-mod MR, mod TR.   History of hiatal hernia    Hypertrophic cardiomyopathy    a. 07/2019 Echo: Mod LVH; b. 01/2020 cMRI: EF 67%, HCM w/o obstruction. Max wall thickness 19mm. Sev LAE w/ L->R atrial level shunt. Large area of LGE @ mid-ventricular level in area of max wall thickness; c. 07/2020 Cardiac CT: Asymm hypertrophy up to 19mm in mid inferoseptum consistent w/ known HCM.   Morbid obesity    a. 09/2014 s/p gastric bypass.   Persistent atrial fibrillation    a. Dx 07/2019-->CHA2DS2VASc = 2 (diast CHF/Fem)-->Eliquis; b. 09/2018 s/p DCCV (150J (biphasic) x 1); c. 12/2019 RFCA/PVI; d. 07/2020 repeat  RFCA/PVI.   Rash    both arms from old bed bug bites, healing   Stroke    Tobacco abuse    Typical atrial flutter    a. 12/2019 s/p RFCA.     Surgical History:  Past Surgical History:  Procedure Laterality Date   ABDOMINAL HYSTERECTOMY     ANKLE SURGERY     left x 2   ATRIAL FIBRILLATION ABLATION N/A 12/24/2019   Procedure: ATRIAL FIBRILLATION ABLATION;  Surgeon: Regan Lemming, MD;  Location: [facility];  Service: Cardiovascular;  Laterality: N/A;   ATRIAL FIBRILLATION ABLATION N/A 08/11/2020   Procedure: ATRIAL  FIBRILLATION ABLATION;  Surgeon: Regan Lemming, MD;  Location: [facility];  Service: Cardiovascular;  Laterality: N/A;   CARDIOVERSION N/A 09/24/2019   Procedure: CARDIOVERSION;  Surgeon: Antonieta Iba, MD;  Location: [facility] ORS;  Service: Cardiovascular;  Laterality: N/A;   CARDIOVERSION N/A 10/17/2019   Procedure: CARDIOVERSION;  Surgeon: Antonieta Iba, MD;  Location: [facility] OR[facility];  Service: Cardiovascular;  Laterality: N/A;   CARDIOVERSION N/A 11/08/2021   Procedure: CARDIOVERSION;  Surgeon: Antonieta Iba, MD;  Location: [facility] OR[facility];  Service: Cardiovascular;  Laterality: N/A;   CESAREAN SECTION     x 3   CLIPPING OF ATRIAL APPENDAGE N/A 03/06/2022   Procedure: CLIPPING OF ATRIAL APPENDAGE USING ATRICURE  CLIP SIZE ;  Surgeon: Loreli Slot, MD;  Location: [facility] OR;  Service: Open Heart Surgery;  Laterality: N/A;   EXCISION OF ATRIAL MYXOMA N/A 03/06/2022   Procedure: RESECTION OF AORTIC VALVE TUMOR;  Surgeon: Loreli Slot, MD;  Location: [facility] OR;  Service: Open Heart Surgery;  Laterality: N/A;   HERNIA REPAIR     x2 one was for sure a hiatal hernia   ICD IMPLANT N/A 12/15/2022   Procedure: ICD IMPLANT;  Surgeon: Regan Lemming, MD;  Location: [facility] INVASIVE CV LAB;  Service: Cardiovascular;  Laterality: N/A;   IR ANGIO EXTRACRAN SEL COM CAROTID INNOMINATE UNI L MOD SED  11/13/2021   IR CT HEAD LTD  11/13/2021   IR PERCUTANEOUS ART THROMBECTOMY/INFUSION INTRACRANIAL INC DIAG ANGIO  11/13/2021   IR THORACENTESIS ASP PLEURAL SPACE W/IMG GUIDE  03/14/2022   LAPAROSCOPIC GASTRIC SLEEVE RESECTION  2020   LAPAROSCOPIC GASTRIC SLEEVE RESECTION WITH HIATAL HERNIA REPAIR  08/17/2015   Procedure: LAPAROSCOPIC GASTRIC SLEEVE RESECTION WITH HIATAL HERNIA REPAIR;  Surgeon: Luretha Murphy, MD;  Location: [facility] ORS;  Service: General;;   LEFT HEART CATH AND CORONARY ANGIOGRAPHY N/A 10/30/2022   Procedure: LEFT HEART CATH AND CORONARY ANGIOGRAPHY;  Surgeon: Iran Ouch, MD;  Location: [facility] INVASIVE CV LAB;  Service: Cardiovascular;  Laterality: N/A;   MAZE N/A 03/06/2022   Procedure: MAZE;  Surgeon: Loreli Slot, MD;  Location: [facility] OR;  Service: Open Heart Surgery;  Laterality: N/A;   RADIOLOGY WITH ANESTHESIA N/A 11/12/2021   Procedure: IR WITH ANESTHESIA;  Surgeon: Julieanne Cotton, MD;  Location: [facility] OR;  Service: Radiology;  Laterality: N/A;   TEE WITHOUT CARDIOVERSION N/A 08/11/2020   Procedure: TRANSESOPHAGEAL ECHOCARDIOGRAM (TEE);  Surgeon: Regan Lemming, MD;  Location: [facility] INVASIVE CV LAB;  Service: Cardiovascular;  Laterality: N/A;   TEE WITHOUT CARDIOVERSION N/A 11/08/2021   Procedure: TRANSESOPHAGEAL ECHOCARDIOGRAM (TEE);  Surgeon: Antonieta Iba, MD;  Location: [facility] ORS;  Service: Cardiovascular;  Laterality: N/A;   TEE WITHOUT CARDIOVERSION N/A 03/06/2022   Procedure: TRANSESOPHAGEAL ECHOCARDIOGRAM (TEE);  Surgeon: Loreli Slot, MD;  Location: [facility] OR;  Service:  Open Heart Surgery;  Laterality: N/A;   TUBAL LIGATION     x 2   UPPER GI ENDOSCOPY  08/17/2015   Procedure: UPPER GI ENDOSCOPY;  Surgeon: Luretha Murphy, MD;  Location: [facility] ORS;  Service: General;;     Medications Prior to Admission  Medication Sig Dispense Refill Last Dose   acetaminophen (TYLENOL) 500 MG tablet Take 500 mg by mouth every 6 (six) hours as needed for moderate pain or headache.   prn   apixaban (ELIQUIS) 5 MG TABS tablet TAKE 1 TABLET(5 MG) BY MOUTH TWICE DAILY 180 tablet 1 01/01/2023 at 0800   aspirin EC 81 MG tablet Take 1 tablet (81 mg total) by mouth daily. Swallow whole. 30 tablet 12 01/01/2023 at 0800   b complex vitamins capsule Take 1 capsule by mouth daily.   01/01/2023 at 0800   cyclobenzaprine (FLEXERIL) 10 MG tablet Take 10 mg by mouth at bedtime.   12/31/2022 at 2130   furosemide (LASIX) 40 MG tablet Take 0.5 tablets (20 mg total) by mouth daily. Take an additional 20mg  tablet as needed (Patient taking differently: Take 40 mg by mouth  daily.) 45 tablet 3 01/01/2023 at 0800   gabapentin (NEURONTIN) 100 MG capsule Take 100 mg by mouth at bedtime.   12/31/2022 at 2130   midodrine (PROAMATINE) 5 MG tablet Take 3 tablets (15 mg total) by mouth 2 (two) times daily with a meal. 180 tablet 7 01/01/2023 at 0800   Omega-3 Fatty Acids (FISH OIL) 1000 MG CAPS Take 100 mg by mouth daily.   01/01/2023   pantoprazole (PROTONIX) 20 MG tablet Take 20 mg by mouth daily.   01/01/2023   potassium chloride SA (KLOR-CON M20) 20 MEQ tablet Take 1 tablet once daily, with Lasix, for 7 days (Patient taking differently: Take 20 mEq by mouth daily.) 7 tablet 0 01/01/2023 at 0800   pravastatin (PRAVACHOL) 20 MG tablet Take 20 mg by mouth daily.   12/31/2022 at 2130   rOPINIRole (REQUIP) 0.5 MG tablet Take 0.5 mg by mouth at bedtime.   12/31/2022 at 2130    Inpatient Medications:   amiodarone  400 mg Oral BID   apixaban  5 mg Oral BID   aspirin EC  81 mg Oral Daily   nicotine  21 mg Transdermal Daily   pantoprazole  40 mg Oral BID   sodium chloride flush  3 mL Intravenous Q12H    Allergies:  Allergies  Allergen Reactions   Atorvastatin Nausea Only   Atarax [Hydroxyzine] Palpitations   Codeine Hives        Hydrocodone Hives    Family History  Problem Relation Age of Onset   Hypertension Mother    Diabetes Mother    Heart failure Mother    Ehlers-Danlos syndrome Daughter    Familial dysautonomia Daughter    Breast cancer Neg Hx      Physical Exam: Vitals:   01/02/23 0600 01/02/23 0700 01/02/23 0800 01/02/23 0900  BP:  95/82 93/75 118/81  Pulse: 77 (!) 57 60 83  Resp: 14 13 12 15   Temp:   97.8 F (36.6 C)   TempSrc:   Oral   SpO2: 97% 100% 100% 99%  Weight:      Height:        GEN- NAD, A&O x 3, normal affect HEENT: Normocephalic, atraumatic Lungs- CTAB, Normal effort.  Heart- Regular rate and rhythm, No M/G/R.  GI- Soft, NT, ND.  Extremities- No clubbing, cyanosis, or edema   Radiology/Studies:  DG Chest Portable 1  View  Result Date: 01/01/2023 CLINICAL DATA:  Chest pain EXAM: PORTABLE CHEST 1 VIEW COMPARISON:  12/15/2022 FINDINGS: Lungs are clear. No pneumothorax or pleural effusion. Cardiac size within normal limits. Median sternotomy and left atrial clipping has been performed. Left subclavian dual lead pacemaker defibrillator is unchanged. Pulmonary vascularity is normal. No acute bone abnormality. IMPRESSION: 1. No active disease. Electronically Signed   By: Helyn Numbers M.D.   On: 01/01/2023 20:34   CUP PACEART INCLINIC DEVICE CHECK  Result Date: 01/01/2023 Patient seen in device clinic for programming directed by Dr. Graciela Husbands. Please see changes below. Error in saving final attachent due to having to use 2 ipads due to app interruption. Added VT Zone at 175 bpm, w/ ATP X3 @ 84%, NID changed from 16 to 56. Changed VT-Mon zone from 171 bpm to 140bpm. Changed VF Zone from 207 bpm to 222 bpm. Shari Prows, MDT Rep present for assistance.  CUP PACEART INCLINIC DEVICE CHECK  Result Date: 12/27/2022 Wound check appointment. Steri-strips removed. Wound without redness or edema. Incision edges approximated, wound well healed. Normal device function. Patient noted to be in accelerated junctional rhythm at 95-100bpm.  Attempt to provide AV  synchrony resulted in patient Ap/VP at 95bpm even with long AV delay. Per note in programmer patient was in accelerated junctional at time of implant.  Thresholds, sensing, and impedances consistent with implant measurements. Device programmed at 3.5V for extra safety margin until 3 month visit. Histogram distribution appropriate for patient and level of activity. No mode switches or ventricular arrhythmias noted. Patient educated about wound care, arm mobility, lifting restrictions, shock plan. ROV in 3 months  with implanting physician.Azalia Bilis BSN,RN,CCDS  DG Chest 2 View  Result Date: 12/15/2022 CLINICAL DATA:  Cardiac device in-situ.  Post pacemaker placement. EXAM: CHEST - 2  VIEW COMPARISON:  Radiographs 10/29/2022 and 05/02/2022.  CT 01/27/2022. FINDINGS: Interval placement of a left subclavian AICD with leads projecting over the right atrium and right ventricle. The heart size and mediastinal contours are stable status post median sternotomy and clipping of the left atrial appendage. The lungs are clear. There is no pleural effusion or pneumothorax. The bones appear unchanged. IMPRESSION: Interval AICD placement as described without demonstrated complication. No acute cardiopulmonary process. Electronically Signed   By: Carey Bullocks M.D.   On: 12/15/2022 17:43   EP PPM/ICD IMPLANT  Result Date: 12/15/2022 SURGEON:  Loman Brooklyn, MD    PREPROCEDURE DIAGNOSES:  1. Hypertrophic cardiomyopathy.     POSTPROCEDURE DIAGNOSES:  1. hypertrophic cardiomyopathy.      PROCEDURES:   1. ICD implantation.  INTRODUCTION:  TONNETTE ZWIEBEL is a 52 y.o. female with hypertrophic cardiomyopathy and junctional rhythm with pauses. She has had episodes of syncope.  The patient therefore  presents today for ICD implantation.    DESCRIPTION OF PROCEDURE:  Informed written consent was obtained and the patient was brought to the electrophysiology lab in the fasting state. The patient was adequately sedated with intravenous Versed, and fentanyl as outlined in the nursing report.  The patient's left chest was prepped and draped in the usual sterile fashion by the EP lab staff.  The skin overlying the left deltopectoral region was infiltrated with lidocaine for local analgesia.  A 5-cm incision was made over the left deltopectoral region.  A left subcutaneous defibrillator pocket was fashioned using a combination of sharp and blunt dissection.  Electrocautery was used to assure hemostasis. RA/RV Lead Placement: The left axillary vein was  cannulated with fluoroscopic visualization.  Through the left axillary vein, a Medtronic CapSureFix Novus [id number] (serial # [id number]  ) right atrial lead and a Medtronic  Sprint Quattro Secure [id number] (serial number [id number]) right ventricular defibrillator lead were advanced with fluoroscopic visualization into the right atrial appendage and right ventricular apex positions respectively.  Initial atrial lead P-waves measured 2.9 mV with an impedance of 744 ohms and a threshold of 0.3 volts at 0.5 milliseconds.  The right ventricular lead R-wave measured 10.7 mV with impedance of 680 ohms and a threshold of 1 volts at 0.5 milliseconds. The leads were secured to the pectoralis  fascia using #2 silk suture over the suture sleeves.  The pocket then  irrigated with copious gentamicin solution.  The leads were then  connected to a Medtronic cobalt XT DR MRI SureScan (serial  Number [id number]) ICD.  The defibrillator was placed into the  pocket.  The pocket was then closed in 3 layers with 2.0 Vicryl suture for the subcutaneous and 3.0 Vicryl suture subcuticular layers. EBL<13ml.  Steri-Strips and a  sterile dressing were then applied.  CONCLUSIONS:  1. Hypertrophic cardiomyopathy  2. Successful ICD implantation.  3. No early apparent complications. [name], MD 12/15/2022 2:40 PM   MM 3D SCREEN BREAST BILATERAL  Result Date: 12/11/2022 CLINICAL DATA:  Screening. EXAM: DIGITAL SCREENING BILATERAL MAMMOGRAM WITH TOMOSYNTHESIS AND CAD TECHNIQUE: Bilateral screening digital craniocaudal and mediolateral oblique mammograms were obtained. Bilateral screening digital breast tomosynthesis was performed. The images were evaluated with computer-aided detection. COMPARISON:  Previous exam(s). ACR Breast Density Category b: There are scattered areas of fibroglandular density. FINDINGS: There are no findings suspicious for malignancy. IMPRESSION: No mammographic evidence of malignancy. A result letter of this screening mammogram will be mailed directly to the patient. RECOMMENDATION: Screening mammogram in one year. (Code:SM-B-01Y) BI-RADS CATEGORY  1: Negative. Electronically Signed    By: Baird Lyons M.D.   On: 12/11/2022 12:41    EKG: 4/15 2000-- afib, rate 99bpm 4/15 2020-- regular, monomorphic, narrow QRS; no appreciable P-waves; rate 117    (personally reviewed)  TELEMETRY: intermittent AV paced, rates 60s-90s; brief episode of NSVT, PVCs (personally reviewed)  DEVICE HISTORY: MDT dual chamber ICD, imp 12/15/2022; dx CM, syncope    Assessment/Plan: #) SVT  #) NSVT Reviewing of yesterday's episode appears patient's "VT" is tri-phasic, very similar to intrinsic QRS morphology. ATP x 3 without resolution, then HV therapy x 1 with return to SR.  Adjusted VT zone -- removed HV therapy, kept ATP At baseline, patient appears to have V-A disassociation with ?retrograde conduction  Patient is getting married this weekend - will IV amio load followed by gtt x 24h - PO amiodarone  BID  Keep K>4, Mag > 2   #) persis Afib S/p AF ablation x 2 Will restart amiodarone as above CHA2DS2-VASc Score = 4 (CHF, stroke, gender) OAC - eliquis  BID, appropriately dosed         For questions or updates, please contact [facility] Please consult [email or web address] for contact info under Cardiology/STEMI.  Signed, Sherie Don, NP  01/02/2023 10:20 AM  Hypertrophic obstructive cardiomyopathy  Atrial fibrillation with prior ablation x 2  HFpEF  Tachycardia  ICD implantation for primary prevention interval shock therapy   Patient was noted to have tachycardia that occurred below her detection window.  Her device was reprogrammed so as to include this, the tachycardia been notable for VA dissociation.  There is also another tachycardia with one-to-one conduction "tachycardia "notable  because of the very short RP interval and presumed to be junctional.  Interrogation of the strips suggested that the tachycardias may be related, there was an abrupt change in cycle length from 600 to 300 ms with 1: 1 conduction changing to 2: 1 VA.  Electrocardiogram morphologies  were quite similar to his sinus with a match of 85-90%.  Retrospectively, i.e. today as opposed to yesterday I and concern that this is a junctional tachycardia with retrograde 2: 1 conduction and with its abrupt change suspect there must be an intra junctional reentry circuit.  We have inactivated high-voltage therapies for heart rates less than 220 bpm so as to avoid shocks.  We have increased the number of ATP in the VT zone.  I will have colleagues look at this but I think that mapping and possible ablation will be the next necessary strategy.  In the interim, she is supposed to get married  on on Saturday so we will begin her i.e. resume her on amiodarone.  Will review tracings tomorrow with Dr. Merita Norton

## 2023-01-02 NOTE — Progress Notes (Signed)
Pt started complaining of muscle twitches around her heart area, no pain, sob or dizziness, did stat EKG, notified MD, no new orders at this time, will continue to monitor.

## 2023-01-03 ENCOUNTER — Encounter: Payer: Self-pay | Admitting: Internal Medicine

## 2023-01-03 DIAGNOSIS — Z4502 Encounter for adjustment and management of automatic implantable cardiac defibrillator: Secondary | ICD-10-CM | POA: Diagnosis not present

## 2023-01-03 DIAGNOSIS — I4819 Other persistent atrial fibrillation: Secondary | ICD-10-CM

## 2023-01-03 NOTE — Progress Notes (Signed)
  Patient Name: Hannah Zimmerman Date of Encounter: 01/03/2023  Primary Cardiologist: Julien Nordmann, MD Electrophysiologist: [name], MD  Interval Summary   Viona Gilmore. Tolerating amiodarone well Anxious about being shocked again.  Inpatient Medications    Scheduled Meds:  amiodarone  400 mg Oral BID   apixaban  5 mg Oral BID   aspirin EC  81 mg Oral Daily   nicotine  21 mg Transdermal Daily   pantoprazole  40 mg Oral BID   sodium chloride flush  3 mL Intravenous Q12H   Continuous Infusions:  amiodarone 30 mg/hr (01/03/23 0319)   PRN Meds: acetaminophen **OR** acetaminophen, mouth rinse   Vital Signs    Vitals:   01/03/23 0400 01/03/23 0500 01/03/23 0600 01/03/23 0700  BP: 98/79 113/88 107/85 (!) 87/61  Pulse: (!) 58 (!) 59 (!) 59 (!) 59  Resp: Temp:      TempSrc:      SpO2: 99% 100% 99% 97%  Weight:  67.2 kg    Height:        Intake/Output Summary (Last 24 hours) at 01/03/2023 0844 Last data filed at 01/03/2023 0319 Gross per 24 hour  Intake 504.11 ml  Output --  Net 504.11 ml   Filed Weights   01/01/23 1955 01/02/23 0326 01/03/23 0500  Weight: 65.8 kg 65.8 kg 67.2 kg    Physical Exam    GEN- The patient is well appearing, alert and oriented x 3 today.   Lungs- Clear to ausculation bilaterally, normal work of breathing Cardiac- Regular rate and rhythm, no murmurs, rubs or gallops GI- soft, NT, ND, + BS Extremities- no clubbing or cyanosis. No edema  Telemetry    Intermittent AV paced 60-70s (personally reviewed)  Hospital Course    Hannah WYSONG is a 52 y.o. 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 ATP x 3 without resolution, then HV therapy x 1 with return to SR.  At baseline, patient appears to have V-A disassociation with ?retrograde conduction  No further arrhythmia on tele or by device Tolerating amiodarone well Will transition to PO amiodarone only today; stop gtt Keep K>4, Mag > 2 Transfer out of ICU, likely discharge tomorrow    #) persis Afib S/p AF ablation x 2 Amiodarone as above CHA2DS2-VASc Score = 4 (CHF, stroke, gender) OAC - eliquis  BID, appropriately dosed      For questions or updates, please contact [facility] Please consult [email or web address] for contact info under Cardiology/STEMI.  Signed, Sherie Don, NP  01/03/2023, 8:44 AM

## 2023-01-03 NOTE — Progress Notes (Signed)
PROGRESS NOTE    Hannah Zimmerman  ZOX:[id number] DOB: 1971-03-19 DOA: 01/01/2023 PCP: Wilford Corner, PA-C    Brief Narrative:  52 y.o. 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  She says she fell to the ground when she experienced ICD discharge but she did not pass out.  She was seen at the EP clinic on the day of admission after her ICD showed accelerated junctional rhythm when she went for routine wound check on 12/27/2022 following ICD implant.   She was evaluated by the EP cardiologist.  She was found to have SVT and NSVT.  IV amiodarone load followed by IV amiodarone infusion was recommended.  Doing well on 4/17.  Transitioning to oral amiodarone.  Gtt. being discontinued.  Transfer to cardiac telemetry.   Assessment & Plan:   Principal Problem:   AICD discharge Active Problems:   Persistent atrial fibrillation   Chronic diastolic CHF (congestive heart failure)   NSVT (nonsustained ventricular tachycardia)   Smoking   Iron deficiency anemia   AICD discharge, SVT, NSVT: Transition off IV amiodarone.  Start p.o. amiodarone 400 mg twice daily.  Transfer to cardiac telemetry.  Monitor overnight.  Anticipate discharge 4/18.     Persistent atrial fibrillation: CHA2DS2-VASc score is 4.  Continue amiodarone and Eliquis.     Chronic diastolic CHF: Compensated.  EF on 2D echo in February 2024 was 65 to 70%.     Other comorbidities include iron deficiency anemia, tobacco use disorder, s/p gastric bypass   DVT prophylaxis: Eliquis Code Status: Full Family Communication: None Disposition Plan: Status is: Inpatient Remains inpatient appropriate because:  Ventricular dysrhythmia.  Status post AICD discharge.  Monitoring overnight.  For discharge 4/18.   Level of care: Telemetry Cardiac  Consultants:  EP  Procedures:  None  Antimicrobials: None   Subjective: Seen and examined.  Comfortably in bed.  No visible distress.  No complaints of pain.  Objective: Vitals:   01/03/23 0900 01/03/23 1000 01/03/23 1100 01/03/23 1246  BP: 96/60 93/61 103/78 102/73  Pulse: (!) 59 60 60 (!) 56  Resp: Temp:    97.8 F (36.6 C)  TempSrc:      SpO2: 100% 100% 100% 95%  Weight:      Height:        Intake/Output Summary (Last 24 hours) at 01/03/2023 1554 Last data filed at 01/03/2023 1028 Gross per 24 hour  Intake 306.92 ml  Output --  Net 306.92 ml   Filed Weights   01/01/23 1955 01/02/23 0326 01/03/23 0500  Weight: 65.8 kg 65.8 kg 67.2 kg    Examination:  General exam: Appears calm and comfortable  Respiratory system: Clear to auscultation. Respiratory effort normal. Cardiovascular system: S1-S2, RRR, no murmurs, no pedal edema Gastrointestinal system: Soft, NT/ND, normal bowel sounds Central nervous system: Alert and oriented. No focal neurological deficits. Extremities: Symmetric 5 x 5 power. Skin: No rashes, lesions or ulcers Psychiatry: Judgement and insight appear normal. Mood & affect appropriate.     Data Reviewed: I have personally reviewed following labs and imaging studies  CBC: Recent Labs  Lab 01/01/23 1959 01/02/23 0549  WBC 7.0 5.7  NEUTROABS 3.9  --   HGB 11.2* 11.4*  HCT 34.3* 35.4*  MCV 90.7 91.2  PLT 303 283   Basic Metabolic Panel: Recent Labs  Lab 01/01/23 1959 01/02/23 0549  NA 141 141  K 3.5 4.1  CL 106 105  CO2 26 28  GLUCOSE 95 84  BUN 23* 21*  CREATININE 1.02* 0.96  CALCIUM 8.9 9.2  MG 2.1 2.5*   GFR: Estimated Creatinine Clearance: 65.3 mL/min (by C-G formula based on SCr of 0.96 mg/dL). Liver Function Tests: Recent Labs  Lab 01/01/23 1959 01/02/23 0549  AST  28 25  ALT 23 20  ALKPHOS 83 89  BILITOT 0.6 0.8  PROT 6.9 6.6  ALBUMIN 4.1 3.8   No results for input(s): "LIPASE", "AMYLASE" in the last 168 hours. No results for input(s): "AMMONIA" in the last 168 hours. Coagulation Profile: No results for input(s): "INR", "PROTIME" in the last 168 hours. Cardiac Enzymes: No results for input(s): "CKTOTAL", "CKMB", "CKMBINDEX", "TROPONINI" in the last 168 hours. BNP (last 3 results) No results for input(s): "PROBNP" in the last 8760 hours. HbA1C: No results for input(s): "HGBA1C" in the last 72 hours. CBG: Recent Labs  Lab 01/01/23 2304  GLUCAP 82   Lipid Profile: No results for input(s): "CHOL", "HDL", "LDLCALC", "TRIG", "CHOLHDL", "LDLDIRECT" in the last 72 hours. Thyroid Function Tests: Recent Labs    01/01/23 1959  TSH 2.072  FREET4 0.72   Anemia Panel: No results for input(s): "VITAMINB12", "FOLATE", "FERRITIN", "TIBC", "IRON", "RETICCTPCT" in the last 72 hours. Sepsis Labs: No results for input(s): "PROCALCITON", "LATICACIDVEN" in the last 168 hours.  No results found for this or any previous visit (from the past 240 hour(s)).       Radiology Studies: DG Chest Portable 1 View  Result Date: 01/01/2023 CLINICAL DATA:  Chest pain EXAM: PORTABLE CHEST 1 VIEW COMPARISON:  12/15/2022 FINDINGS: Lungs are clear. No pneumothorax or pleural effusion. Cardiac size within normal limits. Median sternotomy and left atrial clipping has been performed. Left subclavian dual lead pacemaker defibrillator is unchanged. Pulmonary vascularity is normal. No acute bone abnormality. IMPRESSION: 1. No active disease. Electronically Signed   By: Helyn Numbers M.D.   On: 01/01/2023 20:34   CUP PACEART INCLINIC DEVICE CHECK  Result Date: 01/01/2023 Patient seen in device clinic for programming directed by Dr. Graciela Husbands. Please see changes below. Error in saving final attachent due to having to use 2 ipads due to app interruption. Added VT Zone at 175 bpm,  w/ ATP X3 @ 84%, NID changed from 16 to 56. Changed VT-Mon zone from 171 bpm to 140bpm. Changed VF Zone from 207 bpm to 222 bpm. Shari Prows, MDT Rep present for assistance.       Scheduled Meds:  amiodarone  400 mg Oral BID   apixaban  5 mg Oral BID   aspirin EC  81 mg Oral Daily   nicotine  21 mg Transdermal Daily   pantoprazole  40 mg Oral BID   sodium chloride flush  3 mL Intravenous Q12H   Continuous Infusions:   LOS: 2 days    Tresa Moore, MD [facility]   If 7PM-7AM, please contact night-coverage  01/03/2023, 3:54 PM

## 2023-01-04 ENCOUNTER — Ambulatory Visit: Payer: 59 | Admitting: Internal Medicine

## 2023-01-04 DIAGNOSIS — Z4502 Encounter for adjustment and management of automatic implantable cardiac defibrillator: Secondary | ICD-10-CM | POA: Diagnosis not present

## 2023-01-04 MED ORDER — AMIODARONE HCL 400 MG PO TABS: ORAL_TABLET | ORAL | 0 refills | Status: DC

## 2023-01-04 NOTE — TOC Initial Note (Signed)
Transition of Care [facility]) - Initial/Assessment Note    Patient Details  Name: Hannah Zimmerman MRN: [id number] Date of Birth: Feb 20, 1971  Transition of Care [facility]) CM/SW Contact:    Truddie Hidden, RN Phone Number: 01/04/2023, 10:47 AM  Clinical Narrative:                  Transition of Care [facility]) Screening Note   Patient Details  Name: Hannah Zimmerman Date of Birth: 01-12-1971   Transition of Care [facility]) CM/SW Contact:    Truddie Hidden, RN Phone Number: 01/04/2023, 10:48 AM    Transition of Care Department (TOC) has reviewed patient and no TOC needs have been identified at this time. We will continue to monitor patient advancement through interdisciplinary progression rounds. If new patient transition needs arise, please place a TOC consult.           Patient Goals and CMS Choice            Expected Discharge Plan and Services                                              Prior Living Arrangements/Services                       Activities of Daily Living Home Assistive Devices/Equipment: Eyeglasses ADL Screening (condition at time of admission) Patient's cognitive ability adequate to safely complete daily activities?: Yes Is the patient deaf or have difficulty hearing?: No Does the patient have difficulty seeing, even when wearing glasses/contacts?: No Does the patient have difficulty concentrating, remembering, or making decisions?: No Patient able to express need for assistance with ADLs?: Yes Does the patient have difficulty dressing or bathing?: No Independently performs ADLs?: Yes (appropriate for developmental age) Does the patient have difficulty walking or climbing stairs?: No Weakness of Legs: None Weakness of Arms/Hands: None  Permission Sought/Granted                  Emotional Assessment              Admission diagnosis:  Ventricular tachycardia [I47.20] AICD discharge [Z45.02] Patient Active Problem List    Diagnosis Date Noted   AICD discharge 01/01/2023   Iron deficiency anemia 01/01/2023   Hypertrophic cardiomyopathy 11/03/2022   NSVT (nonsustained ventricular tachycardia) 11/03/2022   Demand ischemia 10/31/2022   NSTEMI (non-ST elevated myocardial infarction) 10/29/2022   Atrial fibrillation, chronic 10/29/2022   HLD (hyperlipidemia) 10/29/2022   Chronic diastolic CHF (congestive heart failure) 10/29/2022   Depression 10/29/2022   Stroke 10/29/2022   S/P Maze operation for atrial fibrillation 03/06/2022   Secondary hypercoagulable state 11/30/2021   Middle cerebral artery embolism, 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   Acute ischemic stroke 11/12/2021   Atypical atrial flutter    Nonrheumatic tricuspid valve regurgitation    Bronchitis with acute wheezing 07/31/2019   Persistent atrial fibrillation 07/30/2019   Acute exacerbation of CHF (congestive heart failure) 07/30/2019   Bronchitis 07/30/2019   Atrial fibrillation with rapid ventricular response    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 [place] [address] [place] [address]  Phone: [phone] Fax: [phone]     Social Determinants of Health (SDOH) Social History: SDOH Screenings   Food Insecurity: No Food Insecurity (01/03/2023)  Housing: Low Risk  (01/03/2023)  Transportation Needs: No Transportation Needs (01/03/2023)  Utilities: Not At Risk (01/03/2023)  Financial Resource Strain: Low Risk  (08/22/2019)  Physical Activity: Sufficiently Active (08/22/2019)  Social Connections: Moderately Isolated (08/22/2019)  Stress: No Stress Concern Present (08/22/2019)  Tobacco Use: Medium Risk (01/03/2023)   SDOH Interventions: Food Insecurity Interventions: Intervention Not Indicated Housing  Interventions: Intervention Not Indicated Transportation Interventions: Intervention Not Indicated Utilities Interventions: Intervention Not Indicated   Readmission Risk Interventions     No data to display

## 2023-01-04 NOTE — Discharge Summary (Signed)
Physician Discharge Summary  Hannah Zimmerman:[id number] DOB: [birth year] DOA: 01/01/2023  PCP: Wilford Corner, PA-C  Admit date: 01/01/2023 Discharge date: 01/04/2023  Admitted From: Home Disposition:  Home  Recommendations for Outpatient Follow-up:  Follow up with PCP in 1-2 weeks Follow-up with EP as directed  Home Health: No Equipment/Devices: None  Discharge Condition: Stable CODE STATUS: Full Diet recommendation: Heart healthy  Brief/Interim Summary: 52 y.o. 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  She says she fell to the ground when she experienced ICD discharge but she did not pass out.  She was seen at the EP clinic on the day of admission after her ICD showed accelerated junctional rhythm when she went for routine wound check on 12/27/2022 following ICD implant.   She was evaluated by the EP cardiologist.  She was found to have SVT and NSVT.  IV amiodarone load followed by IV amiodarone infusion was recommended.   Doing well on 4/17.  Transitioning to oral amiodarone.  Gtt. being discontinued.  Transfer to cardiac telemetry. 4/18: Seen by EP.  Cleared for discharge home.  Recommendation for amiodarone 40 mg p.o. twice daily x 7 days followed by 200 mg p.o. twice daily.  Prescription sent to outpatient pharmacy.  Appropriate for discharge home at this time.  Follow-up outpatient PCP and EP.    Discharge Diagnoses:  Principal Problem:   AICD discharge Active Problems:   Persistent atrial fibrillation   Chronic diastolic CHF (congestive heart failure)   NSVT (nonsustained ventricular tachycardia)   Smoking   Iron deficiency anemia  AICD  discharge, SVT, NSVT: Required IV amiodarone x 24 hours.  Transition to p.o. at time of discharge.  40 mg p.o. twice daily x 7 days followed by 200 mg twice daily indefinitely or until directed otherwise by outpatient electrophysiology.  Stable for discharge home at this time.  Discharge Instructions  Discharge Instructions     Diet - low sodium heart healthy   Complete by: As directed    Increase activity slowly   Complete by: As directed       Allergies as of 01/04/2023       Reactions   Atorvastatin Nausea Only   Atarax [hydroxyzine] Palpitations   Codeine Hives      Hydrocodone Hives        Medication List     TAKE these medications    acetaminophen 500 MG tablet Commonly known as: TYLENOL Take 500 mg by mouth every 6 (six) hours as needed for moderate pain or headache.   amiodarone 400 MG tablet Commonly known as: PACERONE Take 1 tablet (400 mg total) by mouth 2 (two) times daily for 7 days, THEN 0.5 tablets (200 mg total) 2 (two) times daily. Start taking on: January 04, 2023   apixaban 5 MG Tabs tablet Commonly known as: Eliquis TAKE 1 TABLET(5 MG) BY MOUTH TWICE DAILY   aspirin EC 81 MG tablet Take 1 tablet (81 mg total) by mouth daily. Swallow whole.   b complex vitamins capsule Take 1 capsule by mouth daily.   cyclobenzaprine 10 MG tablet Commonly known as: FLEXERIL Take 10 mg by mouth at bedtime.   Fish Oil 1000 MG Caps Take 100 mg by mouth daily.  furosemide 40 MG tablet Commonly known as: LASIX Take 0.5 tablets (20 mg total) by mouth daily. Take an additional 20mg  tablet as needed What changed:  how much to take additional instructions   gabapentin 100 MG capsule Commonly known as: NEURONTIN Take 100 mg by mouth at bedtime.   midodrine 5 MG tablet Commonly known as: PROAMATINE Take 3 tablets (15 mg total) by mouth 2 (two) times daily with a meal.   pantoprazole 20 MG tablet Commonly known as: PROTONIX Take 20 mg by mouth daily.    potassium chloride SA 20 MEQ tablet Commonly known as: Klor-Con M20 Take 1 tablet once daily, with Lasix, for 7 days What changed:  how much to take how to take this when to take this additional instructions   pravastatin 20 MG tablet Commonly known as: PRAVACHOL Take 20 mg by mouth daily.   rOPINIRole 0.5 MG tablet Commonly known as: REQUIP Take 0.5 mg by mouth at bedtime.        Allergies  Allergen Reactions   Atorvastatin Nausea Only   Atarax [Hydroxyzine] Palpitations   Codeine Hives        Hydrocodone Hives    Consultations: EP   Procedures/Studies: DG Chest Portable 1 View  Result Date: 01/01/2023 CLINICAL DATA:  Chest pain EXAM: PORTABLE CHEST 1 VIEW COMPARISON:  12/15/2022 FINDINGS: Lungs are clear. No pneumothorax or pleural effusion. Cardiac size within normal limits. Median sternotomy and left atrial clipping has been performed. Left subclavian dual lead pacemaker defibrillator is unchanged. Pulmonary vascularity is normal. No acute bone abnormality. IMPRESSION: 1. No active disease. Electronically Signed   By: Helyn Numbers M.D.   On: 01/01/2023 20:34   CUP PACEART INCLINIC DEVICE CHECK  Result Date: 01/01/2023 Patient seen in device clinic for programming directed by Dr. Graciela Husbands. Please see changes below. Error in saving final attachent due to having to use 2 ipads due to app interruption. Added VT Zone at 175 bpm, w/ ATP X3 @ 84%, NID changed from 16 to 56. Changed VT-Mon zone from 171 bpm to 140bpm. Changed VF Zone from 207 bpm to 222 bpm. Shari Prows, MDT Rep present for assistance.  CUP PACEART INCLINIC DEVICE CHECK  Result Date: 12/27/2022 Wound check appointment. Steri-strips removed. Wound without redness or edema. Incision edges approximated, wound well healed. Normal device function. Patient noted to be in accelerated junctional rhythm at 95-100bpm.  Attempt to provide AV  synchrony resulted in patient Ap/VP at 95bpm even with long AV delay. Per note in  programmer patient was in accelerated junctional at time of implant.  Thresholds, sensing, and impedances consistent with implant measurements. Device programmed at 3.5V for extra safety margin until 3 month visit. Histogram distribution appropriate for patient and level of activity. No mode switches or ventricular arrhythmias noted. Patient educated about wound care, arm mobility, lifting restrictions, shock plan. ROV in 3 months  with implanting physician.Azalia Bilis BSN,RN,CCDS  DG Chest 2 View  Result Date: 12/15/2022 CLINICAL DATA:  Cardiac device in-situ.  Post pacemaker placement. EXAM: CHEST - 2 VIEW COMPARISON:  Radiographs 10/29/2022 and 05/02/2022.  CT 01/27/2022. FINDINGS: Interval placement of a left subclavian AICD with leads projecting over the right atrium and right ventricle. The heart size and mediastinal contours are stable status post median sternotomy and clipping of the left atrial appendage. The lungs are clear. There is no pleural effusion or pneumothorax. The bones appear unchanged. IMPRESSION: Interval AICD placement as described without demonstrated complication. No acute cardiopulmonary process. Electronically Signed  By: Carey Bullocks M.D.   On: 12/15/2022 17:43   EP PPM/ICD IMPLANT  Result Date: 12/15/2022 SURGEON:  Loman Brooklyn, MD    PREPROCEDURE DIAGNOSES:  1. Hypertrophic cardiomyopathy.     POSTPROCEDURE DIAGNOSES:  1. hypertrophic cardiomyopathy.      PROCEDURES:   1. ICD implantation.  INTRODUCTION:  RAELEIGH GUINN is a 52 y.o. female with hypertrophic cardiomyopathy and junctional rhythm with pauses. She has had episodes of syncope.  The patient therefore  presents today for ICD implantation.    DESCRIPTION OF PROCEDURE:  Informed written consent was obtained and the patient was brought to the electrophysiology lab in the fasting state. The patient was adequately sedated with intravenous Versed, and fentanyl as outlined in the nursing report.  The patient's left  chest was prepped and draped in the usual sterile fashion by the EP lab staff.  The skin overlying the left deltopectoral region was infiltrated with lidocaine for local analgesia.  A 5-cm incision was made over the left deltopectoral region.  A left subcutaneous defibrillator pocket was fashioned using a combination of sharp and blunt dissection.  Electrocautery was used to assure hemostasis. RA/RV Lead Placement: The left axillary vein was cannulated with fluoroscopic visualization.  Through the left axillary vein, a Medtronic CapSureFix Novus [id number] (serial # [id number]  ) right atrial lead and a Medtronic Sprint Quattro Secure [id number] (serial number [id number]) right ventricular defibrillator lead were advanced with fluoroscopic visualization into the right atrial appendage and right ventricular apex positions respectively.  Initial atrial lead P-waves measured 2.9 mV with an impedance of 744 ohms and a threshold of 0.3 volts at 0.5 milliseconds.  The right ventricular lead R-wave measured 10.7 mV with impedance of 680 ohms and a threshold of 1 volts at 0.5 milliseconds. The leads were secured to the pectoralis  fascia using #2 silk suture over the suture sleeves.  The pocket then  irrigated with copious gentamicin solution.  The leads were then  connected to a Medtronic cobalt XT DR MRI SureScan (serial  Number [id number]) ICD.  The defibrillator was placed into the  pocket.  The pocket was then closed in 3 layers with 2.0 Vicryl suture for the subcutaneous and 3.0 Vicryl suture subcuticular layers. EBL<33ml.  Steri-Strips and a  sterile dressing were then applied.  CONCLUSIONS:  1. Hypertrophic cardiomyopathy  2. Successful ICD implantation.  3. No early apparent complications. [name], MD 12/15/2022 2:40 PM   MM 3D SCREEN BREAST BILATERAL  Result Date: 12/11/2022 CLINICAL DATA:  Screening. EXAM: DIGITAL SCREENING BILATERAL MAMMOGRAM WITH TOMOSYNTHESIS AND CAD TECHNIQUE: Bilateral screening  digital craniocaudal and mediolateral oblique mammograms were obtained. Bilateral screening digital breast tomosynthesis was performed. The images were evaluated with computer-aided detection. COMPARISON:  Previous exam(s). ACR Breast Density Category b: There are scattered areas of fibroglandular density. FINDINGS: There are no findings suspicious for malignancy. IMPRESSION: No mammographic evidence of malignancy. A result letter of this screening mammogram will be mailed directly to the patient. RECOMMENDATION: Screening mammogram in one year. (Code:[id number]-B-01Y) BI-RADS CATEGORY  1: Negative. Electronically Signed   By: Baird Lyons M.D.   On: 12/11/2022 12:41      Subjective: Seen and examined on the day of discharge.  Stable no distress.  Chest pain-free.  Discharge Exam: Vitals:   01/04/23 0741 01/04/23 1215  BP: 100/72 108/75  Pulse: 71 81  Resp: 20 18  Temp: 97.8 F (36.6 C) 97.9 F (36.6 C)  SpO2: 100% 100%   Vitals:  01/03/23 2339 01/04/23 0411 01/04/23 0741 01/04/23 1215  BP: (!) 83/67 100/74 100/72 108/75  Pulse: 72 67 71 81  Resp: Temp: (!) 97.5 F (36.4 C) 97.7 F (36.5 C) 97.8 F (36.6 C) 97.9 F (36.6 C)  TempSrc:   Oral Oral  SpO2: 99% 100% 100% 100%  Weight:      Height:        General: Pt is alert, awake, not in acute distress Cardiovascular: RRR, S1/S2 +, no rubs, no gallops Respiratory: CTA bilaterally, no wheezing, no rhonchi Abdominal: Soft, NT, ND, bowel sounds + Extremities: no edema, no cyanosis    The results of significant diagnostics from this hospitalization (including imaging, microbiology, ancillary and laboratory) are listed below for reference.     Microbiology: No results found for this or any previous visit (from the past 240 hour(s)).   Labs: BNP (last 3 results) Recent Labs    03/17/22 1136 03/24/22 1008 10/29/22 1622  BNP 1,144.1* 877.4* 439.5*   Basic Metabolic Panel: Recent Labs  Lab 01/01/23 1959  01/02/23 0549  NA 141 141  K 3.5 4.1  CL 106 105  CO2 26 28  GLUCOSE 95 84  BUN 23* 21*  CREATININE 1.02* 0.96  CALCIUM 8.9 9.2  MG 2.1 2.5*   Liver Function Tests: Recent Labs  Lab 01/01/23 1959 01/02/23 0549  AST 28 25  ALT 23 20  ALKPHOS 83 89  BILITOT 0.6 0.8  PROT 6.9 6.6  ALBUMIN 4.1 3.8   No results for input(s): "LIPASE", "AMYLASE" in the last 168 hours. No results for input(s): "AMMONIA" in the last 168 hours. CBC: Recent Labs  Lab 01/01/23 1959 01/02/23 0549  WBC 7.0 5.7  NEUTROABS 3.9  --   HGB 11.2* 11.4*  HCT 34.3* 35.4*  MCV 90.7 91.2  PLT 303 283   Cardiac Enzymes: No results for input(s): "CKTOTAL", "CKMB", "CKMBINDEX", "TROPONINI" in the last 168 hours. BNP: Invalid input(s): "POCBNP" CBG: Recent Labs  Lab 01/01/23 2304  GLUCAP 82   D-Dimer No results for input(s): "DDIMER" in the last 72 hours. Hgb A1c No results for input(s): "HGBA1C" in the last 72 hours. Lipid Profile No results for input(s): "CHOL", "HDL", "LDLCALC", "TRIG", "CHOLHDL", "LDLDIRECT" in the last 72 hours. Thyroid function studies Recent Labs    01/01/23 1959  TSH 2.072   Anemia work up No results for input(s): "VITAMINB12", "FOLATE", "FERRITIN", "TIBC", "IRON", "RETICCTPCT" in the last 72 hours. Urinalysis    Component Value Date/Time   COLORURINE AMBER (A) 03/02/2022 1150   APPEARANCEUR CLOUDY (A) 03/02/2022 1150   LABSPEC 1.029 03/02/2022 1150   PHURINE 5.0 03/02/2022 1150   GLUCOSEU NEGATIVE 03/02/2022 1150   HGBUR NEGATIVE 03/02/2022 1150   BILIRUBINUR NEGATIVE 03/02/2022 1150   KETONESUR 5 (A) 03/02/2022 1150   PROTEINUR 30 (A) 03/02/2022 1150   NITRITE NEGATIVE 03/02/2022 1150   LEUKOCYTESUR NEGATIVE 03/02/2022 1150   Sepsis Labs Recent Labs  Lab 01/01/23 1959 01/02/23 0549  WBC 7.0 5.7   Microbiology No results found for this or any previous visit (from the past 240 hour(s)).   Time coordinating discharge: Over 30  minutes  SIGNED:   Tresa Moore, MD  [facility] 01/04/2023, 1:51 PM Pager   If 7PM-7AM, please contact night-coverage

## 2023-01-04 NOTE — Progress Notes (Addendum)
  Patient Name: Hannah Zimmerman Date of Encounter: 01/04/2023  Primary Cardiologist: Julien Nordmann, MD Electrophysiologist: [name], MD  Interval Summary   Hannah Zimmerman.  Tx to step-down. Tolerating amiodarone well, no GI upset. Excited to leave hospital and get married this weekend  Inpatient Medications    Scheduled Meds:  amiodarone  400 mg Oral BID   apixaban  5 mg Oral BID   aspirin EC  81 mg Oral Daily   nicotine  21 mg Transdermal Daily   pantoprazole  40 mg Oral BID   sodium chloride flush  3 mL Intravenous Q12H   Continuous Infusions:   PRN Meds: acetaminophen **OR** acetaminophen, mouth rinse   Vital Signs    Vitals:   01/03/23 2025 01/03/23 2339 01/04/23 0411 01/04/23 0741  BP: 103/66 (!) 83/67 100/74 100/72  Pulse: 81 72 67 71  Resp: Temp: 98 F (36.7 C) (!) 97.5 F (36.4 C) 97.7 F (36.5 C) 97.8 F (36.6 C)  TempSrc:    Oral  SpO2: 100% 99% 100% 100%  Weight:      Height:        Intake/Output Summary (Last 24 hours) at 01/04/2023 1131 Last data filed at 01/04/2023 1011 Gross per 24 hour  Intake 273.06 ml  Output --  Net 273.06 ml   Filed Weights   01/01/23 1955 01/02/23 0326 01/03/23 0500  Weight: 65.8 kg 65.8 kg 67.2 kg    Physical Exam    GEN- The patient is well appearing, alert and oriented x 3 today.   Lungs- Clear to ausculation bilaterally, normal work of breathing Cardiac- Regular rate and rhythm, no murmurs, rubs or gallops GI- soft, NT, ND, + BS Extremities- no clubbing or cyanosis. No edema  Telemetry    Intermittent AV paced 60-80s (personally reviewed)  Hospital Course    Hannah Zimmerman is a 52 y.o. 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 ATP x 3 without resolution, then HV therapy x 1 with return to SR.  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 [name]  OK to discharge from EP perspective    #) persis Afib S/p AF ablation x 2 Amiodarone as above CHA2DS2-VASc Score = 4 (CHF, stroke, gender) OAC - eliquis  BID, appropriately dosed      For questions or updates, please contact [facility] Please consult [email or web address] for contact info under Cardiology/STEMI.  Signed, Sherie Don, NP  01/04/2023, 11:31 AM   No interval significant arrhythmias overnight.  Will continue the amiodarone for now.  Have sent the strips to [employer] and my colleague trying to get a better handle on what the mechanism of the tachycardia is.  Will discharge her today.  She is to be married on Saturday

## 2023-01-12 ENCOUNTER — Encounter: Payer: 59 | Admitting: Cardiology

## 2023-01-16 ENCOUNTER — Ambulatory Visit: Payer: 59 | Attending: Cardiology | Admitting: Cardiology

## 2023-01-16 ENCOUNTER — Encounter: Payer: Self-pay | Admitting: Cardiology

## 2023-01-16 VITALS — BP 114/72 | HR 80 | Ht 64.0 in | Wt 151.0 lb

## 2023-01-16 DIAGNOSIS — I472 Ventricular tachycardia, unspecified: Secondary | ICD-10-CM | POA: Diagnosis not present

## 2023-01-16 DIAGNOSIS — D6869 Other thrombophilia: Secondary | ICD-10-CM

## 2023-01-16 DIAGNOSIS — I4819 Other persistent atrial fibrillation: Secondary | ICD-10-CM

## 2023-01-16 DIAGNOSIS — Z79899 Other long term (current) drug therapy: Secondary | ICD-10-CM

## 2023-01-16 DIAGNOSIS — I422 Other hypertrophic cardiomyopathy: Secondary | ICD-10-CM

## 2023-01-16 LAB — CUP PACEART INCLINIC DEVICE CHECK
Battery Remaining Longevity: 133 mo
Battery Voltage: 3.1 V
Brady Statistic AP VP Percent: 22.18 %
Brady Statistic AP VS Percent: 25.81 %
Brady Statistic AS VP Percent: 6.68 %
Brady Statistic AS VS Percent: 45.33 %
Brady Statistic RA Percent Paced: 61.13 %
Brady Statistic RV Percent Paced: 28.86 %
Date Time Interrogation Session: 20240430092902
HighPow Impedance: 44 Ohm
Implantable Lead Connection Status: 753985
Implantable Lead Connection Status: 753985
Implantable Lead Implant Date: 20240329
Implantable Lead Implant Date: 20240329
Implantable Lead Location: 753859
Implantable Lead Location: 753860
Implantable Lead Model: 5076
Implantable Pulse Generator Implant Date: 20240329
Lead Channel Impedance Value: 304 Ohm
Lead Channel Impedance Value: 399 Ohm
Lead Channel Impedance Value: 418 Ohm
Lead Channel Pacing Threshold Amplitude: 0.5 V
Lead Channel Pacing Threshold Amplitude: 0.75 V
Lead Channel Pacing Threshold Amplitude: 1 V
Lead Channel Pacing Threshold Pulse Width: 0.4 ms
Lead Channel Pacing Threshold Pulse Width: 0.4 ms
Lead Channel Pacing Threshold Pulse Width: 0.4 ms
Lead Channel Sensing Intrinsic Amplitude: 1.9 mV
Lead Channel Sensing Intrinsic Amplitude: 10.4 mV
Lead Channel Setting Pacing Amplitude: 3.5 V
Lead Channel Setting Pacing Amplitude: 3.5 V
Lead Channel Setting Pacing Pulse Width: 0.4 ms
Lead Channel Setting Sensing Sensitivity: 0.3 mV
Zone Setting Status: 755011
Zone Setting Status: 755011
Zone Setting Status: 755011

## 2023-01-16 NOTE — Progress Notes (Signed)
Electrophysiology Office Note   Date:  01/16/2023   ID:  Hannah Zimmerman, DOB [birth year], MRN [id number]  PCP:  Wilford Corner, PA-C  Cardiologist:  Mariah Milling Primary Electrophysiologist:  Sheri Gatchel Jorja Loa, MD    Chief Complaint: AF   History of Present Illness: Hannah Zimmerman is a 52 y.o. female who is being seen today for the evaluation of AF at the request of Wilford Corner,*. Presenting today for electrophysiology evaluation.  She has a history significant for hypertrophic cardiomyopathy, morbid obesity post gastric bypass, persistent atrial fibrillation post ablation x 2, most recently 08/11/2020.  She is status post [employer] ICD implanted 12/15/2022.  She presented to [facility] after walking to the store.  She felt palpitations and had presyncope and had a shock from her ICD.  Amiodarone was started.  Today, denies symptoms of palpitations, chest pain, shortness of breath, orthopnea, PND, lower extremity edema, claudication, dizziness, presyncope, syncope, bleeding, or neurologic sequela. The patient is tolerating medications without difficulties.  Since her device was implanted, she was admitted to the hospital as above.  Post admission, she has done well.  She has not had any further episodes of ventricular tachycardia.  She is tolerating amiodarone with only mild headache.  She Imri Lor take Tylenol for this.    Past Medical History:  Diagnosis Date   (HFpEF) heart failure with preserved ejection fraction (HCC)    a. 07/2019 Echo: EF 60-65%, mod LVH. Sev dil LA, mildly dil RA. Mod elev PASP; b. 07/2020 TEE: EF 50-55%, no rwma, sev conc LVH. Nl RV fxn. Mod dil RA. Mild-mod MR, mod TR.   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 07/2020 Cardiac CT: Cor Ca2+ = 338 (99th %'ile).   Complication of anesthesia 20 years ago   a. spinal with first c section went too high stopped breathing, low bp with 2nd c section, no further issues  with anesthesia   Heart murmur    a. 07/2019 Echo: no significant valvular dzs; b. 07/2020 TEE: Mild-mod MR, mod TR.   History of hiatal hernia    Hypertrophic cardiomyopathy (HCC)    a. 07/2019 Echo: Mod LVH; b. 01/2020 cMRI: EF 67%, HCM w/o obstruction. Max wall thickness 19mm. Sev LAE w/ L->R atrial level shunt. Large area of LGE @ mid-ventricular level in area of max wall thickness; c. 07/2020 Cardiac CT: Asymm hypertrophy up to 19mm in mid inferoseptum consistent w/ known HCM.   Morbid obesity (HCC)    a. 09/2014 s/p gastric bypass.   Persistent atrial fibrillation (HCC)    a. Dx 07/2019-->CHA2DS2VASc = 2 (diast CHF/Fem)-->Eliquis; b. 09/2018 s/p DCCV (150J (biphasic) x 1); c. 12/2019 RFCA/PVI; d. 07/2020 repeat RFCA/PVI.   Rash    both arms from old bed bug bites, healing   Stroke [facility])    Tobacco abuse    Typical atrial flutter (HCC)    a. 12/2019 s/p RFCA.   Past Surgical History:  Procedure Laterality Date   ABDOMINAL HYSTERECTOMY     ANKLE SURGERY     left x 2   ATRIAL FIBRILLATION ABLATION N/A 12/24/2019   Procedure: ATRIAL FIBRILLATION ABLATION;  Surgeon: Regan Lemming, MD;  Location: [facility];  Service: Cardiovascular;  Laterality: N/A;   ATRIAL FIBRILLATION ABLATION N/A 08/11/2020   Procedure: ATRIAL FIBRILLATION ABLATION;  Surgeon: Regan Lemming, MD;  Location: [facility];  Service: Cardiovascular;  Laterality: N/A;   CARDIOVERSION N/A 09/24/2019  Procedure: CARDIOVERSION;  Surgeon: Antonieta Iba, MD;  Location: [facility] ORS;  Service: Cardiovascular;  Laterality: N/A;   CARDIOVERSION N/A 10/17/2019   Procedure: CARDIOVERSION;  Surgeon: Antonieta Iba, MD;  Location: [facility] ORS;  Service: Cardiovascular;  Laterality: N/A;   CARDIOVERSION N/A 11/08/2021   Procedure: CARDIOVERSION;  Surgeon: Antonieta Iba, MD;  Location: [facility] ORS;  Service: Cardiovascular;  Laterality: N/A;   CESAREAN SECTION     x 3   CLIPPING OF ATRIAL APPENDAGE N/A  03/06/2022   Procedure: CLIPPING OF ATRIAL APPENDAGE USING ATRICURE  CLIP SIZE ;  Surgeon: Loreli Slot, MD;  Location[facility];  Service: Open Heart Surgery;  Laterality: N/A;   EXCISION OF ATRIAL MYXOMA N/A 03/06/2022   Procedure: RESECTION OF AORTIC VALVE TUMOR;  Surgeon: Loreli Slot, MD;  Location: [facility] OR;  Service: Open Heart Surgery;  Laterality: N/A;   HERNIA REPAIR     x2 one was for sure a hiatal hernia   ICD IMPLANT N/A 12/15/2022   Procedure: ICD IMPLANT;  Surgeon: Regan Lemming, MD;  Location: [facility] INVASIVE CV LAB;  Service: Cardiovascular;  Laterality: N/A;   IR ANGIO EXTRACRAN SEL COM CAROTID INNOMINATE UNI L MOD SED  11/13/2021   IR CT HEAD LTD  11/13/2021   IR PERCUTANEOUS ART THROMBECTOMY/INFUSION INTRACRANIAL INC DIAG ANGIO  11/13/2021   IR THORACENTESIS ASP PLEURAL SPACE W/IMG GUIDE  03/14/2022   LAPAROSCOPIC GASTRIC SLEEVE RESECTION  2020   LAPAROSCOPIC GASTRIC SLEEVE RESECTION WITH HIATAL HERNIA REPAIR  08/17/2015   Procedure: LAPAROSCOPIC GASTRIC SLEEVE RESECTION WITH HIATAL HERNIA REPAIR;  Surgeon: Luretha Murphy, MD;  Location: [facility] ORS;  Service: General;;   LEFT HEART CATH AND CORONARY ANGIOGRAPHY N/A 10/30/2022   Procedure: LEFT HEART CATH AND CORONARY ANGIOGRAPHY;  Surgeon: Iran Ouch, MD;  Location: [facility] INVASIVE CV LAB;  Service: Cardiovascular;  Laterality: N/A;   MAZE N/A 03/06/2022   Procedure: MAZE;  Surgeon: Loreli Slot, MD;  Location: [facility] OR;  Service: Open Heart Surgery;  Laterality: N/A;   RADIOLOGY WITH ANESTHESIA N/A 11/12/2021   Procedure: IR WITH ANESTHESIA;  Surgeon: Julieanne Cotton, MD;  Location: [facility] OR;  Service: Radiology;  Laterality: N/A;   TEE WITHOUT CARDIOVERSION N/A 08/11/2020   Procedure: TRANSESOPHAGEAL ECHOCARDIOGRAM (TEE);  Surgeon: Regan Lemming, MD;  Location: [facility] INVASIVE CV LAB;  Service: Cardiovascular;  Laterality: N/A;   TEE WITHOUT CARDIOVERSION N/A 11/08/2021   Procedure:  TRANSESOPHAGEAL ECHOCARDIOGRAM (TEE);  Surgeon: Antonieta Iba, MD;  Location: [facility] ORS;  Service: Cardiovascular;  Laterality: N/A;   TEE WITHOUT CARDIOVERSION N/A 03/06/2022   Procedure: TRANSESOPHAGEAL ECHOCARDIOGRAM (TEE);  Surgeon: Loreli Slot, MD;  Location: [facility] OR;  Service: Open Heart Surgery;  Laterality: N/A;   TUBAL LIGATION     x 2   UPPER GI ENDOSCOPY  08/17/2015   Procedure: UPPER GI ENDOSCOPY;  Surgeon: Luretha Murphy, MD;  Location: [facility] ORS;  Service: General;;     Current Outpatient Medications  Medication Sig Dispense Refill   acetaminophen (TYLENOL) 500 MG tablet Take 500 mg by mouth every 6 (six) hours as needed for moderate pain or headache.     amiodarone (PACERONE) 400 MG tablet Take 1 tablet (400 mg total) by mouth 2 (two) times daily for 7 days, THEN 0.5 tablets (200 mg total) 2 (two) times daily. 44 tablet 0   amoxicillin-clavulanate (AUGMENTIN) 875-125 MG tablet Take 1 tablet by mouth 2 (two) times daily.     apixaban (ELIQUIS) 5  MG TABS tablet TAKE 1 TABLET(5 MG) BY MOUTH TWICE DAILY 180 tablet 1   aspirin EC 81 MG tablet Take 1 tablet (81 mg total) by mouth daily. Swallow whole. 30 tablet 12   b complex vitamins capsule Take 1 capsule by mouth daily.     cyclobenzaprine (FLEXERIL) 10 MG tablet Take 10 mg by mouth at bedtime.     furosemide (LASIX) 40 MG tablet Take 0.5 tablets (20 mg total) by mouth daily. Take an additional 20mg  tablet as needed (Patient taking differently: Take 40 mg by mouth daily.) 45 tablet 3   gabapentin (NEURONTIN) 100 MG capsule Take 100 mg by mouth at bedtime.     midodrine (PROAMATINE) 5 MG tablet Take 3 tablets (15 mg total) by mouth 2 (two) times daily with a meal. 180 tablet 7   Omega-3 Fatty Acids (FISH OIL) 1000 MG CAPS Take 100 mg by mouth daily.     pantoprazole (PROTONIX) 20 MG tablet Take 20 mg by mouth daily.     potassium chloride SA (KLOR-CON M20) 20 MEQ tablet Take 1 tablet once daily, with Lasix, for 7 days  (Patient taking differently: Take 20 mEq by mouth daily.) 7 tablet 0   pravastatin (PRAVACHOL) 20 MG tablet Take 20 mg by mouth daily.     rOPINIRole (REQUIP) 0.5 MG tablet Take 0.5 mg by mouth at bedtime.     No current facility-administered medications for this visit.    Allergies:   Atorvastatin, Atarax [hydroxyzine], Codeine, and Hydrocodone   Social History:  The patient  reports that she quit smoking about 3 months ago. Her smoking use included cigarettes. She has a 7.75 pack-year smoking history. She has been exposed to tobacco smoke. She has never used smokeless tobacco. She reports current alcohol use of about 1.0 - 2.0 standard drink of alcohol per week. She reports that she does not use drugs.   Family History:  The patient's family history includes Diabetes in her mother; Ehlers-Danlos syndrome in her daughter; Familial dysautonomia in her daughter; Heart failure in her mother; Hypertension in her mother.   ROS:  Please see the history of present illness.   Otherwise, review of systems is positive for none.   All other systems are reviewed and negative.   PHYSICAL EXAM: VS:  BP 114/72   Pulse 80   Ht 5\' 4"  (1.626 m)   Wt 151 lb (68.5 kg)   SpO2 97%   BMI 25.92 kg/m  , BMI Body mass index is 25.92 kg/m. GEN: Well nourished, well developed, in no acute distress  HEENT: normal  Neck: no JVD, carotid bruits, or masses Cardiac: RRR; no murmurs, rubs, or gallops,no edema  Respiratory:  clear to auscultation bilaterally, normal work of breathing GI: soft, nontender, nondistended, + BS MS: no deformity or atrophy  Skin: warm and dry, device site well healed Neuro:  Strength and sensation are intact Psych: euthymic mood, full affect  EKG:  EKG is ordered today. Personal review of the ekg ordered shows AV paced  Personal review of the device interrogation today. Results in [facility]   Recent Labs: 10/29/2022: B Natriuretic Peptide 439.5 01/01/2023: TSH 2.072 01/02/2023: ALT  20; BUN 21; Creatinine, Ser 0.96; Hemoglobin 11.4; Magnesium 2.5; Platelets 283; Potassium 4.1; Sodium 141    Lipid Panel     Component Value Date/Time   CHOL 153 10/29/2022 1639   CHOL 165 08/02/2022 1507   TRIG 35 10/29/2022 1639   HDL 71 10/29/2022 1639   HDL  69 08/02/2022 1507   CHOLHDL 2.2 10/29/2022 1639   VLDL 7 10/29/2022 1639   LDLCALC 75 10/29/2022 1639   LDLCALC 83 08/02/2022 1507     Wt Readings from Last 3 Encounters:  01/16/23 151 lb (68.5 kg)  01/03/23 148 lb 2.4 oz (67.2 kg)  12/15/22 145 lb (65.8 kg)      Other studies Reviewed: Additional studies/ records that were reviewed today include: Cardiac MRI 01/28/2020 Review of the above records today demonstrates:  1.  Normal LV chamber size, with hyperdynamic function, LVEF 67%.   2.  Normal RV chamber size, RVEF 70%.   3. 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 Maximal wall thickness at the mid ventricular level is 19 mm.   4. Large area of dense delayed myocardial enhancement at the mid ventricular level in the area of maximal wall thickness. Visually appears > 15% of the myocardial mass.   5. Severe left atrial enlargement. Iatrogenic left to right atrial level shunt.   6.  No findings to suggests cardiac amyloidosis.  ECV 22%.  TEE 11/08/21   1. Left ventricular ejection fraction, by estimation, is 50-55 %. The  left ventricle has normal function. The left ventricle has no regional  wall motion abnormalities. There is mild concentric left ventricular  hypertrophy.   2. Right ventricular systolic function is normal. The right ventricular  size is normal.   3. Left atrial size was severely dilated. No left atrial/left atrial  appendage thrombus was detected.   4. Right atrial size was moderately dilated.   5. The mitral valve is normal in structure. No evidence of mitral valve  regurgitation. No evidence of mitral stenosis.   6. Tricuspid valve  regurgitation is moderate.   7. The aortic valve is normal in structure. Aortic valve regurgitation is  not visualized. Aortic valve sclerosis is present, with no evidence of  aortic valve stenosis.   8. The inferior vena cava is normal in size with greater than 50%  respiratory variability, suggesting right atrial pressure of 3 mmHg.   9. Agitated saline contrast bubble study was negative, with no evidence  of any interatrial shunt.   ASSESSMENT AND PLAN:  1.  Persistent atrial fibrillation/flutter: Currently on Eliquis.  CHA2DS2-VASc of 3.  Status post ablation x 2 as well as surgical maze with left atrial appendage clipping.  Noted on device interrogation.  2.  Hypertrophic cardiomyopathy: Status post [employer] ICD implanted 12/15/2022.  Device functioning appropriately.  Sensing, threshold, impedance within normal limits.  3.  Morbid obesity: Post gastric bypass  4.  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  Post surgical removal.  5.  Secondary to coagula state: Currently on Eliquis for atrial fibrillation  6.  VT: Received ICD shock.  Now on amiodarone.  Has fortunately not had any further episodes on amiodarone.  Juniel Groene continue with current management.  7.  High risk medication monitoring: Currently on amiodarone for VT.  Jurnie Garritano need to check labs in 6 months for monitoring.  Current medicines are reviewed at length with the patient today.   The patient does not have concerns regarding her medicines.  The following changes were made today: None  Labs/ tests ordered today include:  Orders Placed This Encounter  Procedures   EKG 12-Lead     Disposition:   FU 2 months  Signed, Andreas Sobolewski Jorja Loa, MD  01/16/2023 9:29 AM     [facility] [address] [place]  [address] [phone] (office) [phone] (fax)

## 2023-02-01 ENCOUNTER — Encounter: Payer: Self-pay | Admitting: Cardiology

## 2023-02-01 ENCOUNTER — Encounter: Payer: Self-pay | Admitting: Neurology

## 2023-02-01 ENCOUNTER — Ambulatory Visit (INDEPENDENT_AMBULATORY_CARE_PROVIDER_SITE_OTHER): Payer: 59 | Admitting: Neurology

## 2023-02-01 VITALS — BP 112/72 | HR 76 | Ht 64.0 in | Wt 149.4 lb

## 2023-02-01 DIAGNOSIS — G4452 New daily persistent headache (NDPH): Secondary | ICD-10-CM | POA: Diagnosis not present

## 2023-02-01 DIAGNOSIS — Z8673 Personal history of transient ischemic attack (TIA), and cerebral infarction without residual deficits: Secondary | ICD-10-CM | POA: Diagnosis not present

## 2023-02-01 DIAGNOSIS — I4819 Other persistent atrial fibrillation: Secondary | ICD-10-CM

## 2023-02-01 MED ORDER — TOPIRAMATE 25 MG PO TABS
25.0000 mg | ORAL_TABLET | Freq: Two times a day (BID) | ORAL | 3 refills | Status: DC
Start: 2023-02-01 — End: 2023-12-03

## 2023-02-01 NOTE — Progress Notes (Addendum)
[facility] [address] [place]. [place] [address] [phone]       OFFICE CONSULT NOTE  Ms. [name] Date of Birth:  [birth year] Medical Record Number:  [id number]   Referring MD:  Egbert Garibaldi  Reason for Referral: Stroke  [id number] visit 01/18/22 ; Mr. Depaoli is a 52 year old pleasant Caucasian lady seen today for initial office consultation visit for stroke.  History is obtained from the patient and her daughter as well as review of electronic medical records and opossum reviewed pertinent available imaging films in PACS.  She has past medical history of atrial fibrillation/flutter, congestive heart failure, tobacco abuse who presented to [facility] on 11/12/2021 with sudden onset of lethargy, left-sided weakness, slurred speech and gaze deviation.  NIH stroke scale was 24 on admission.  She was not a candidate for IV thrombolysis as she was on Eliquis.  Noncontrast CAT scan of the head showed aspect score of 10 with developing some hypodensity in the right hemisphere and dense right MCA sign.  CT angiogram of the head and neck showed nonopacification proximal to mid right ICA extending into the intracranial portion of the terminus and proximal right MCA.  Patient was emergently transferred to [facility] and underwent successful mechanical thrombectomy with TICI3 revascularization by Dr. Estanislado Pandy.  She was kept in the intensive care unit and closely monitored and blood pressure was tightly controlled.  She did very well and noticed significant improvement in the left-sided deficits.  Echocardiogram showed ejection fraction of 60 to 65% with severe concentric left ventricular hypertrophy.  LDL cholesterol was 87 mg percent.  Hemoglobin A1c of 6.1.  MRI scan of the brain shows patchy infarction in the right basal ganglia with some mild petechial hemorrhage.  There was also subtle diffusion signal in the adjacent right frontal and insular  cortex.  Discussion was made about switching Eliquis to alternative anticoagulants but patient's insurance co-pay would be quite high and hence it was decided to continue Eliquis.  Patient was discharged home with home physical and Occupational Therapy.  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  The patient states her left-sided deficits have mostly resolved.  She has cut back smoking but still smokes about 6 cigarettes a day.  She is complaining of chronic headaches since her stroke.  These are mostly in the back of the head and top of the neck they are mild to moderate dull in quality.  No accompanying light or sound sensitivity or nausea.  She takes Tylenol 2 tablets twice daily for last several months.  She does admit to significant increase stress in her life and she has not been participating in any stress laxation activity.  She does complain of decreased strength and difficulty with focusing and plans to see an eye doctor. Update 08/02/2022 ; she returns for follow-up after last visit with  me 6 months ago.  She is accompanied by her daughter.  Patient states she had open heart surgery with resection of aortic valve fibroelastoma with clipping of left atrial appendage on 03/06/2022.  Surgery went well without complications but she has noticed worsening of her existing stroke symptoms with speech and word finding difficulties, depth perception on the right and she is tired.  She denies any new strokelike symptoms.  She is back on Eliquis and is tolerating it well without bleeding or bruising.  She is tolerating Lipitor  well without muscle aches.  Her blood pressure is under good control and today it is 130/93.  Having mild short-term memory difficulties and trouble remembering recent information.  He has not been participating in cognitively challenging activities.  He has not had any evaluation for her memory  loss. Update 02/01/2023 : She returns for follow-up after last visit 6 months ago.  She states she is doing well.  She has had no recurrent stroke or TIA symptoms.  She had an ICD placed on 12/15/2022 by Dr. [name] and a week later while in the grocery store she felt sharp.  He is rehospitalized and found to have rapid heart rate and started on amiodarone.  He has done well since then.  She however has a new complaint of daily headaches that she is after last 6 months.  She describes  these as bitemporal moderate severity occasionally spreading to the back of her head.  There is no accompanying nausea or vomiting or light or sound sensitivities.  She does have to lie down sometimes to get relief.  She takes Tylenol which takes the edge off but headache does not completely go away.  She does admit to some muscle tightness in the back of her head and neck.  Has no prior history of similar headaches.  She does have remote history of migraine headaches but those are quite different.  She is unable to identify specific triggers or relieving factors for the headaches.  She has not had any recent brain imaging done.  She remains on Eliquis which she is tolerating well with easy bruising but no bleeding episodes.  She is tolerating Pravachol well without muscle aches and pains.  Last lipid profile on 10/29/2022 showed LDL cholesterol to be 75 mg percent.  Hemoglobin A1c was borderline at 6.1.  She continues to have mild short-term memory difficulties but these stable and nonprogressive.  She is independent in all activities of daily living. ROS:   14 system review of systems is positive for headache, memory loss, speech difficulties, word finding difficulties, difficult flexion, imbalance neck tightness, weakness all other systems negative  PMH:  Past Medical History:  Diagnosis Date   (HFpEF) heart failure with preserved ejection fraction (HCC)    a. 07/2019 Echo: EF 60-65%, mod LVH. Sev dil LA, mildly dil RA. Mod  elev PASP; b. 07/2020 TEE: EF 50-55%, no rwma, sev conc LVH. Nl RV fxn. Mod dil RA. Mild-mod MR, mod TR.   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 07/2020 Cardiac CT: Cor Ca2+ = 338 (99th %'ile).   Complication of anesthesia 20 years ago   a. spinal with first c section went too high stopped breathing, low bp with 2nd c section, no further issues with anesthesia   Heart murmur    a. 07/2019 Echo: no significant valvular dzs; b. 07/2020 TEE: Mild-mod MR, mod TR.   History of hiatal hernia    Hypertrophic cardiomyopathy (HCC)    a. 07/2019 Echo: Mod LVH; b. 01/2020 cMRI: EF 67%, HCM w/o obstruction. Max wall thickness 19mm. Sev LAE w/ L->R atrial level shunt. Large area of LGE @ mid-ventricular level in area of max wall thickness; c. 07/2020 Cardiac CT: Asymm hypertrophy up to 19mm in mid inferoseptum consistent w/ known HCM.   Morbid obesity (HCC)    a. 09/2014 s/p gastric bypass.   Persistent atrial fibrillation (HCC)    a. Dx 07/2019-->CHA2DS2VASc = 2 (diast CHF/Fem)-->Eliquis; b. 09/2018 s/p DCCV (  150J (biphasic) x 1); c. 12/2019 RFCA/PVI; d. 07/2020 repeat RFCA/PVI.   Rash    both arms from old bed bug bites, healing   Stroke [facility])    Tobacco abuse    Typical atrial flutter (HCC)    a. 12/2019 s/p RFCA.    Social History:  Social History   Socioeconomic History   Marital status: Significant Other    Spouse name: Not on file   Number of children: 3   Years of education: two years of college   Highest education level: Not on file  Occupational History   Occupation: office assistant  Tobacco Use   Smoking status: Former    Packs/day: 0.25    Years: 31.00    Additional pack years: 0.00    Total pack years: 7.75    Types: Cigarettes    Quit date: 10/03/2022    Years since quitting: 0.3    Passive exposure: Current   Smokeless tobacco: Never  Vaping Use   Vaping Use: Never used  Substance and Sexual Activity   Alcohol use: Yes    Alcohol/week: 1.0 - 2.0  standard drink of alcohol    Types: 1 - 2 Standard drinks or equivalent per week    Comment: Occaional use - 1-2 mixed drinks prior to stroke since d/c nothing 11/30/21   Drug use: No   Sexual activity: Yes  Other Topics Concern   Not on file  Social History Narrative   Lives at home with significant other.   Some caffeine daily I her green tea.   Right-handed.   Social Determinants of Health   Financial Resource Strain: Low Risk  (08/22/2019)   Overall Financial Resource Strain (CARDIA)    Difficulty of Paying Living Expenses: Not hard at all  Food Insecurity: No Food Insecurity (01/03/2023)   Hunger Vital Sign    Worried About Running Out of Food in the Last Year: Never true    Ran Out of Food in the Last Year: Never true  Transportation Needs: No Transportation Needs (01/03/2023)   PRAPARE - Administrator, Civil Service (Medical): No    Lack of Transportation (Non-Medical): No  Physical Activity: Sufficiently Active (08/22/2019)   Exercise Vital Sign    Days of Exercise per Week: 7 days    Minutes of Exercise per Session: 150+ min  Stress: No Stress Concern Present (08/22/2019)   [employer] of Occupational Health - Occupational Stress Questionnaire    Feeling of Stress : Only a little  Social Connections: Moderately Isolated (08/22/2019)   Social Connection and Isolation Panel [NHANES]    Frequency of Communication with Friends and Family: More than three times a week    Frequency of Social Gatherings with Friends and Family: More than three times a week    Attends Religious Services: 1 to 4 times per year    Active Member of [employer] or Organizations: No    Attends Banker Meetings: Never    Marital Status: Separated  Intimate Partner Violence: Not At Risk (01/03/2023)   Humiliation, Afraid, Rape, and Kick questionnaire    Fear of Current or Ex-Partner: No    Emotionally Abused: No    Physically Abused: No    Sexually Abused: No     Medications:   Current Outpatient Medications on File Prior to Visit  Medication Sig Dispense Refill   acetaminophen (TYLENOL) 500 MG tablet Take 500 mg by mouth every 6 (six) hours as needed for moderate pain or  headache.     amiodarone (PACERONE) 400 MG tablet Take 1 tablet (400 mg total) by mouth 2 (two) times daily for 7 days, THEN 0.5 tablets (200 mg total) 2 (two) times daily. 44 tablet 0   apixaban (ELIQUIS) 5 MG TABS tablet TAKE 1 TABLET(5 MG) BY MOUTH TWICE DAILY 180 tablet 1   aspirin EC 81 MG tablet Take 1 tablet (81 mg total) by mouth daily. Swallow whole. 30 tablet 12   b complex vitamins capsule Take 1 capsule by mouth daily.     cyclobenzaprine (FLEXERIL) 10 MG tablet Take 10 mg by mouth at bedtime.     furosemide (LASIX) 40 MG tablet Take 0.5 tablets (20 mg total) by mouth daily. Take an additional 20mg  tablet as needed (Patient taking differently: Take 40 mg by mouth daily.) 45 tablet 3   gabapentin (NEURONTIN) 100 MG capsule Take 100 mg by mouth at bedtime.     Omega-3 Fatty Acids (FISH OIL) 1000 MG CAPS Take 100 mg by mouth daily.     pantoprazole (PROTONIX) 20 MG tablet Take 20 mg by mouth daily.     potassium chloride SA (KLOR-CON M20) 20 MEQ tablet Take 1 tablet once daily, with Lasix, for 7 days (Patient taking differently: Take 20 mEq by mouth daily.) 7 tablet 0   pravastatin (PRAVACHOL) 20 MG tablet Take 20 mg by mouth daily.     rOPINIRole (REQUIP) 0.5 MG tablet Take 0.5 mg by mouth at bedtime.     midodrine (PROAMATINE) 5 MG tablet Take 3 tablets (15 mg total) by mouth 2 (two) times daily with a meal. (Patient not taking: Reported on 02/01/2023) 180 tablet 7   No current facility-administered medications on file prior to visit.    Allergies:   Allergies  Allergen Reactions   Atorvastatin Nausea Only   Atarax [Hydroxyzine] Palpitations   Codeine Hives        Hydrocodone Hives    Physical Exam General: well developed, well nourished, seated, in no  evident distress Head: head normocephalic and atraumatic.   Neck: supple with no carotid or supraclavicular bruits Cardiovascular: regular rate and rhythm, no murmurs Musculoskeletal: no deformity Skin:  no rash/petichiae Vascular:  Normal pulses all extremities  Neurologic Exam Mental Status: Awake and fully alert. Oriented to place and time. Recent and remote memory intact. Attention span, concentration and fund of knowledge appropriate. Mood and affect appropriate. MMSE 30/30 Intact recall 3/3.  Able to name 14 animals which can walk on 4 legs.  Clock drawing 4/4. Cranial Nerves: Fundoscopic exam not done. Pupils equal, briskly reactive to light. Extraocular movements full without nystagmus. Visual fields full to confrontation. Hearing intact. Facial sensation intact. Face, tongue, palate moves normally and symmetrically.  Motor: Normal bulk and tone. Normal strength in all tested extremity muscles.  Diminished fine finger movements on the left.  Orbits right over left upper extremity. Sensory.: intact to touch , pinprick , position and vibratory sensation.  Coordination: Rapid alternating movements normal in all extremities. Finger-to-nose and heel-to-shin performed accurately bilaterally. Gait and Station: Arises from chair without difficulty. Stance is normal. Gait demonstrates normal stride length and balance . Able to heel, toe and tandem walk with only mild difficulty.  Reflexes: 1+ and symmetric. Toes downgoing.   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  Patient has been extremely well with hardly any residual deficits.  Vascular risk factors of atrial fibrillation, hyperlipidemia, hypertension and smoking.  She has had worsening of her stroke deficits following an open heart surgery in June 2023 along with mild cognitive impairment which appears stable.  New complaints of daily persistent headache for the last 6 months likely transformed tension headaches with analgesic rebound.Marland Kitchen    PLANI had a long discussion with the patient regarding her new complaints of daily persistent headache.  Checking CT scan of the brain to rule out interval new strokes.  I recommend she participate in regular activities.  Relaxation like meditation, yoga, exercise.  Trial of Topamax 25 mg twice daily.  Do regular neck stretching exercises.  I have advised her to discontinue taking Tylenol to reduce analgesic rebound headache.  Continue eliquis for stroke prevention for A-fib and maintain aggressive risk factor modification.  She may also consider participation in the [place] AFib stroke prevention study if interested.  Return for follow-up in 3 months with nurse practitioner or call earlier if necessary..  Greater than 50% time during this 40-minute  visit were spent on counseling and coordination of care about her new daily headache, embolic stroke and atrial fibrillation and mild cognitive impairment and discussion about prevention and treatment and answering questions. Delia Heady, MD  Note: This document was prepared with digital dictation and possible smart phrase technology. Any transcriptional errors that result from this process are unintentional.

## 2023-02-01 NOTE — Patient Instructions (Addendum)
I had a long discussion with the patient regarding her new complaints of daily persistent headache.  Checking CT scan of the brain to rule out interval new strokes.  I recommend she participate in regular activities.  Relaxation like meditation, yoga, exercise.  Trial of Topamax 25 mg twice daily.  Do regular neck stretching exercises.  Continue eliquis for stroke prevention for A-fib and maintain aggressive risk factor modification.  She may also consider participation in the [place] [employer] stroke prevention study if interested.  Return for follow-up in 3 months with nurse practitioner or call earlier if necessary.  Neck Exercises Ask your health care provider which exercises are safe for you. Do exercises exactly as told by your health care provider and adjust them as directed. It is normal to feel mild stretching, pulling, tightness, or discomfort as you do these exercises. Stop right away if you feel sudden pain or your pain gets worse. Do not begin these exercises until told by your health care provider. Neck exercises can be important for many reasons. They can improve strength and maintain flexibility in your neck, which will help your upper back and prevent neck pain. Stretching exercises Rotation neck stretching  Sit in a chair or stand up. Place your feet flat on the floor, shoulder-width apart. Slowly turn your head (rotate) to the right until a slight stretch is felt. Turn it all the way to the right so you can look over your right shoulder. Do not tilt or tip your head. Hold this position for 10-30 seconds. Slowly turn your head (rotate) to the left until a slight stretch is felt. Turn it all the way to the left so you can look over your left shoulder. Do not tilt or tip your head. Hold this position for 10-30 seconds. Repeat __________ times. Complete this exercise __________ times a day. Neck retraction  Sit in a sturdy chair or stand up. Look straight ahead. Do not bend your  neck. Use your fingers to push your chin backward (retraction). Do not bend your neck for this movement. Continue to face straight ahead. If you are doing the exercise properly, you will feel a slight sensation in your throat and a stretch at the back of your neck. Hold the stretch for 1-2 seconds. Repeat __________ times. Complete this exercise __________ times a day. Strengthening exercises Neck press  Lie on your back on a firm bed or on the floor with a pillow under your head. Use your neck muscles to push your head down on the pillow and straighten your spine. Hold the position as well as you can. Keep your head facing up (in a neutral position) and your chin tucked. Slowly count to 5 while holding this position. Repeat __________ times. Complete this exercise __________ times a day. Isometrics These are exercises in which you strengthen the muscles in your neck while keeping your neck still (isometrics). Sit in a supportive chair and place your hand on your forehead. Keep your head and face facing straight ahead. Do not flex or extend your neck while doing isometrics. Push forward with your head and neck while pushing back with your hand. Hold for 10 seconds. Do the sequence again, this time putting your hand against the back of your head. Use your head and neck to push backward against the hand pressure. Finally, do the same exercise on either side of your head, pushing sideways against the pressure of your hand. Repeat __________ times. Complete this exercise __________ times a day.  Prone head lifts  Lie face-down (prone position), resting on your elbows so that your chest and upper back are raised. Start with your head facing downward, near your chest. Position your chin either on or near your chest. Slowly lift your head upward. Lift until you are looking straight ahead. Then continue lifting your head as far back as you can comfortably stretch. Hold your head up for 5 seconds.  Then slowly lower it to your starting position. Repeat __________ times. Complete this exercise __________ times a day. Supine head lifts  Lie on your back (supine position), bending your knees to point to the ceiling and keeping your feet flat on the floor. Lift your head slowly off the floor, raising your chin toward your chest. Hold for 5 seconds. Repeat __________ times. Complete this exercise __________ times a day. Scapular retraction  Stand with your arms at your sides. Look straight ahead. Slowly pull both shoulders (scapulae) backward and downward (retraction) until you feel a stretch between your shoulder blades in your upper back. Hold for 10-30 seconds. Relax and repeat. Repeat __________ times. Complete this exercise __________ times a day. Contact a health care provider if: Your neck pain or discomfort gets worse when you do an exercise. Your neck pain or discomfort does not improve within 2 hours after you exercise. If you have any of these problems, stop exercising right away. Do not do the exercises again unless your health care provider says that you can. Get help right away if: You develop sudden, severe neck pain. If this happens, stop exercising right away. Do not do the exercises again unless your health care provider says that you can. This information is not intended to replace advice given to you by your health care provider. Make sure you discuss any questions you have with your health care provider. Document Revised: 03/01/2021 Document Reviewed: 03/01/2021 Elsevier Patient Education  2023 [employer]  Tension Headache, Adult A tension headache is a feeling of pain, pressure, or aching over the front and sides of the head. The pain can be dull, or it can feel tight. There are two types of tension headache: Episodic tension headache. This is when the headaches happen fewer than 15 days a month. Chronic tension headache. This is when the headaches happen more  than 15 days a month during a 12-month period. A tension headache can last from 30 minutes to several days. It is the most common kind of headache. Tension headaches are not normally associated with nausea or vomiting, and they do not get worse with physical activity. What are the causes? The exact cause of this condition is not known. Tension headaches are often triggered by stress, anxiety, or depression. Other triggers may include: Alcohol. Too much caffeine or caffeine withdrawal. Respiratory infections, such as colds, flu, or sinus infections. Dental problems or teeth clenching. Fatigue. Holding your head and neck in the same position for a long period of time, such as while using a computer. Smoking. Arthritis of the neck. What are the signs or symptoms? Symptoms of this condition include: A feeling of pressure or tightness around the head. Dull, aching head pain. Pain over the front and sides of the head. Tenderness in the muscles of the head, neck, and shoulders. How is this diagnosed? This condition may be diagnosed based on your symptoms, your medical history, and a physical exam. If your symptoms are severe or unusual, you may have imaging tests, such as a CT scan or an MRI of  your head. Your vision may also be checked. How is this treated? This condition may be treated with lifestyle changes and with medicines that help relieve symptoms. Follow these instructions at home: Managing pain Take over-the-counter and prescription medicines only as told by your health care provider. When you have a headache, lie down in a dark, quiet room. If directed, put ice on your head and neck. To do this: Put ice in a plastic bag. Place a towel between your skin and the bag. Leave the ice on for 20 minutes, 2-3 times a day. Remove the ice if your skin turns bright red. This is very important. If you cannot feel pain, heat, or cold, you have a greater risk of damage to the area. If directed,  apply heat to the back of your neck as often as told by your health care provider. Use the heat source that your health care provider recommends, such as a moist heat pack or a heating pad. Place a towel between your skin and the heat source. Leave the heat on for 20-30 minutes. Remove the heat if your skin turns bright red. This is especially important if you are unable to feel pain, heat, or cold. You have a greater risk of getting burned. Eating and drinking Eat meals on a regular schedule. If you drink alcohol: Limit how much you have to: 0-1 drink a day for women who are not pregnant. 0-2 drinks a day for men. Know how much alcohol is in your drink. In the [place] one drink equals one 12 oz bottle of beer (355 mL), one 5 oz glass of wine (148 mL), or one 1 oz glass of hard liquor (44 mL). Drink enough fluid to keep your urine pale yellow. Decrease your caffeine intake, or stop using caffeine. Lifestyle Get 7-9 hours of sleep each night, or get the amount of sleep recommended by your health care provider. At bedtime, remove computers, phones, and tablets from your room. Find ways to manage your stress. This may include: Exercise. Deep breathing exercises. Yoga. Listening to music. Positive mental imagery. Try to sit up straight and avoid tensing your muscles. Do not use any products that contain nicotine or tobacco. These include cigarettes, chewing tobacco, and vaping devices, such as e-cigarettes. If you need help quitting, ask your health care provider. General instructions  Avoid any headache triggers. Keep a journal to help find out what may trigger your headaches. For example, write down: What you eat and drink. How much sleep you get. Any change to your diet or medicines. Keep all follow-up visits. This is important. Contact a health care provider if: Your headache does not get better. Your headache comes back. You are sensitive to sounds, light, or smells because of a  headache. You have nausea or you vomit. Your stomach hurts. Get help right away if: You suddenly develop a severe headache, along with any of the following: A stiff neck. Nausea and vomiting. Confusion. Weakness in one part or one side of your body. Double vision or loss of vision. Shortness of breath. Rash. Unusual sleepiness. Fever or chills. Trouble speaking. Pain in your eye or ear. Trouble walking or balancing. Feeling faint or passing out. Summary A tension headache is a feeling of pain, pressure, or aching over the front and sides of the head. A tension headache can last from 30 minutes to several days. It is the most common kind of headache. This condition may be diagnosed based on your symptoms, your medical  history, and a physical exam. This condition may be treated with lifestyle changes and with medicines that help relieve symptoms. This information is not intended to replace advice given to you by your health care provider. Make sure you discuss any questions you have with your health care provider. Document Revised: 06/03/2020 Document Reviewed: 06/03/2020 Elsevier Patient Education  2023 [employer].

## 2023-02-06 ENCOUNTER — Telehealth: Payer: Self-pay | Admitting: Neurology

## 2023-02-06 NOTE — Telephone Encounter (Signed)
Hannah Zimmerman: [id number] exp. 02/01/23-08/04/23, [employer] [facility]: [id number] exp. 02/01/2023-03/03/2023 for [employer] [phone]

## 2023-02-06 NOTE — Telephone Encounter (Signed)
Correction on the Drucie Opitz: [id number] exp. 02/06/23-08/05/23

## 2023-02-08 ENCOUNTER — Encounter: Payer: Self-pay | Admitting: Neurology

## 2023-02-13 ENCOUNTER — Ambulatory Visit
Admission: RE | Admit: 2023-02-13 | Discharge: 2023-02-13 | Disposition: A | Discharge status: Home / Self Care | Payer: 59 | Source: Ambulatory Visit | Attending: Neurology | Admitting: Neurology

## 2023-02-13 DIAGNOSIS — G4452 New daily persistent headache (NDPH): Secondary | ICD-10-CM | POA: Diagnosis not present

## 2023-02-13 DIAGNOSIS — R519 Headache, unspecified: Secondary | ICD-10-CM | POA: Diagnosis not present

## 2023-02-13 DIAGNOSIS — R531 Weakness: Secondary | ICD-10-CM | POA: Diagnosis not present

## 2023-02-13 MED ORDER — IOHEXOL 300 MG/ML  SOLN
75.0000 mL | Freq: Once | INTRAMUSCULAR | Status: AC | PRN
  Administered 2023-02-13: 75 mL via INTRAVENOUS

## 2023-02-16 ENCOUNTER — Encounter: Payer: Self-pay | Admitting: Neurology

## 2023-02-26 IMAGING — MR MR HEAD W/O CM
13 of 14 series · 44 of 48 positions shown · non-contrast
Comparison: CTA from yesterday

CLINICAL DATA: Stroke follow-up

EXAM:
MRI HEAD WITHOUT CONTRAST
TECHNIQUE: Multiplanar, multiecho pulse sequences of the brain and surrounding
structures were obtained without intravenous contrast.

[Series 5: DWI · axial · 3.0mm · 0.88mm/px · z∈[-107,+44]mm · 7 of 104 slices shown (1 of 4)]
[im 1/104]
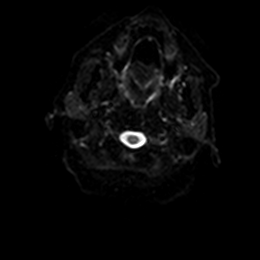
[im 18/104]
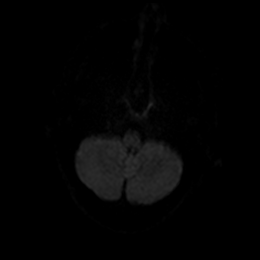
[im 35/104]
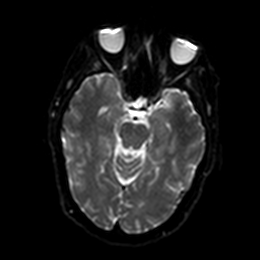
[im 52/104]
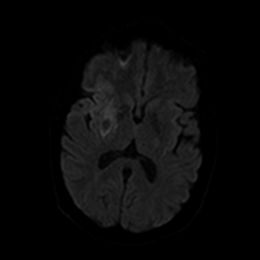
[im 69/104]
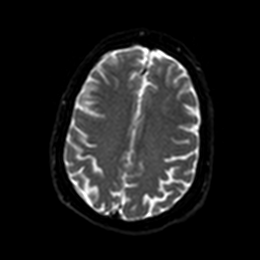
[im 86/104]
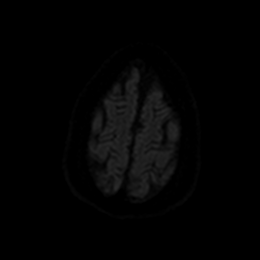
[im 104/104]
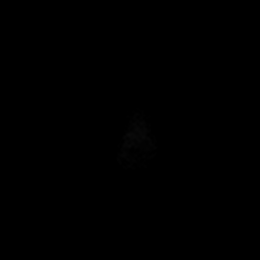

[Series 6: DWI · axial · 3.0mm · 0.88mm/px · z∈[-107,+44]mm · 4 of 52 slices shown (2 of 4)]
[im 1/52]
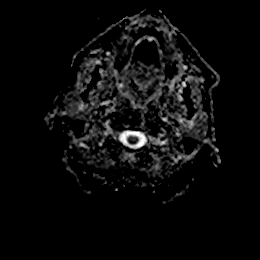
[im 18/52]
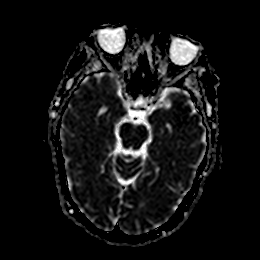
[im 35/52]
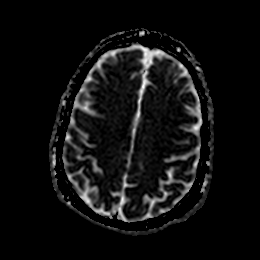
[im 52/52]
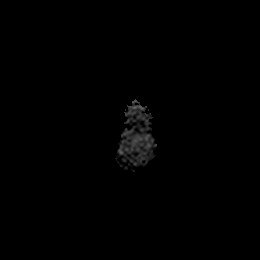

[Series 7: DWI · coronal · 4.0mm · 0.88mm/px · 5 of 76 slices shown (3 of 4)]
[im 1/76]
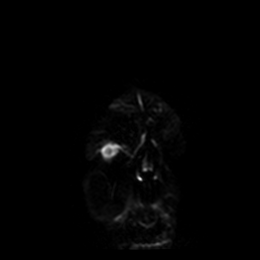
[im 19/76]
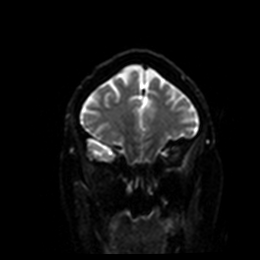
[im 38/76]
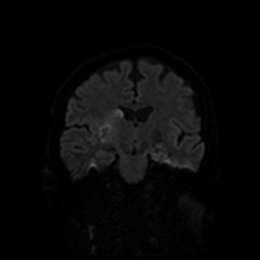
[im 57/76]
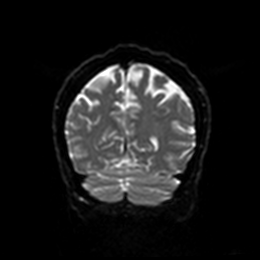
[im 76/76]
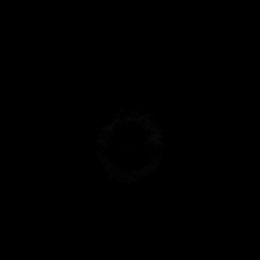

[Series 8: DWI · coronal · 4.0mm · 0.88mm/px · 3 of 38 slices shown (4 of 4)]
[im 1/38]
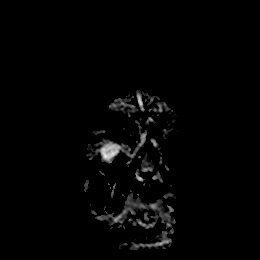
[im 19/38]
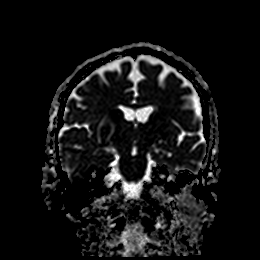
[im 38/38]
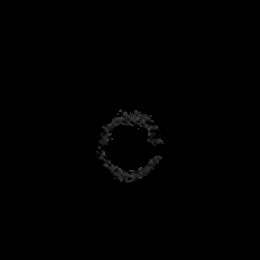

[Series 9: T1 · sagittal · 5.0mm · 0.75mm/px · 2 of 23 slices shown]
[im 1/23]
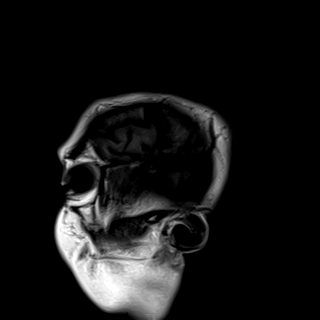
[im 23/23]
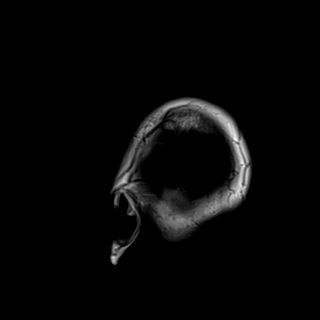

[Series 10: T2 · axial · 5.0mm · 0.72mm/px · z∈[-108,+45]mm · 2 of 27 slices shown (1 of 2)]
[im 1/27]
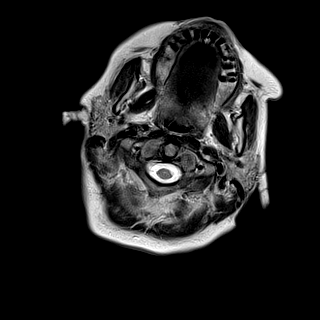
[im 27/27]
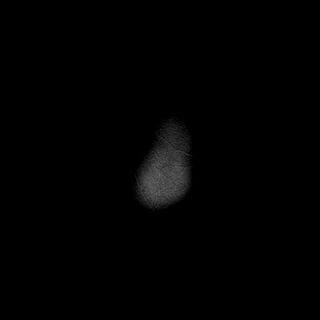

[Series 11: FLAIR · axial · 5.0mm · 0.45mm/px · z∈[-109,+45]mm · 2 of 27 slices shown (1 of 2)]
[im 1/27]
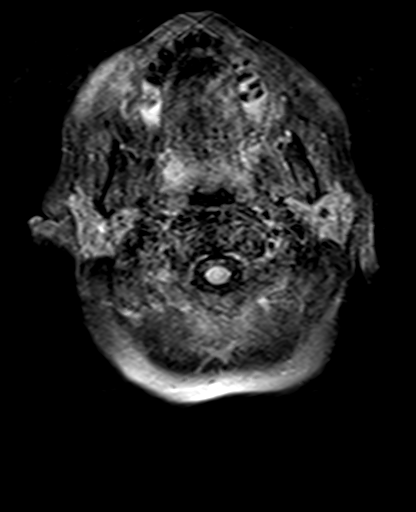
[im 27/27]
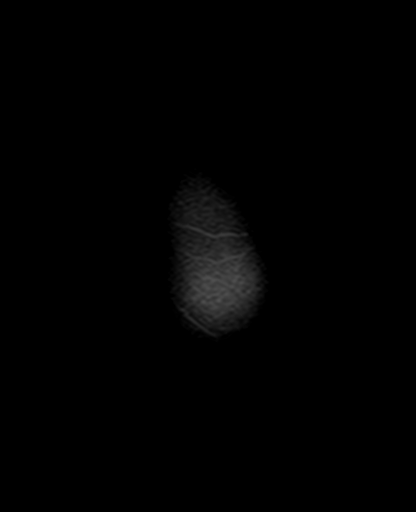

[Series 12: mag_images · axial · 3.0mm · 0.90mm/px · z∈[-117,+45]mm · 4 of 56 slices shown]
[im 1/56]
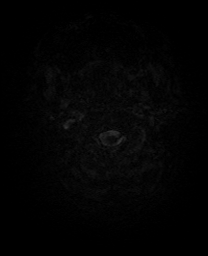
[im 19/56]
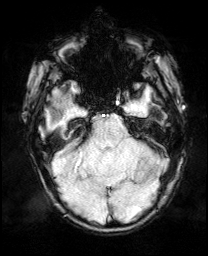
[im 37/56]
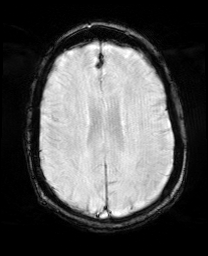
[im 56/56]
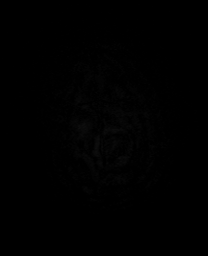

[Series 13: pha_images · axial · 3.0mm · 0.90mm/px · z∈[-114,+45]mm · 4 of 55 slices shown]
[im 1/55]
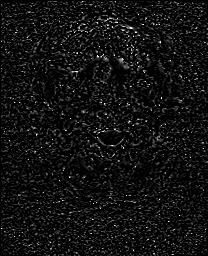
[im 19/55]
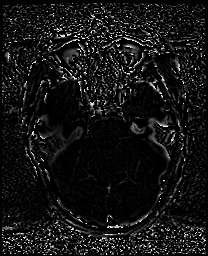
[im 37/55]
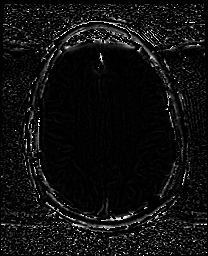
[im 55/55]
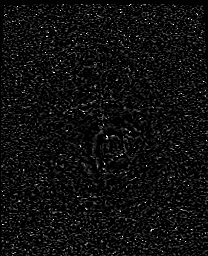

[Series 14: swi_images · axial · 3.0mm · 0.90mm/px · z∈[-117,+45]mm · 4 of 56 slices shown]
[im 1/56]
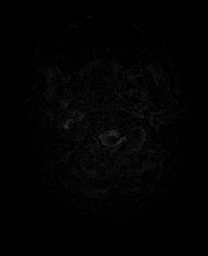
[im 19/56]
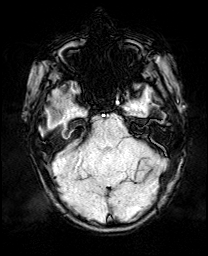
[im 37/56]
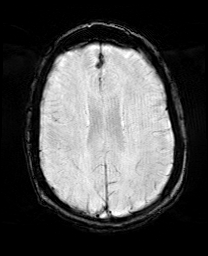
[im 56/56]
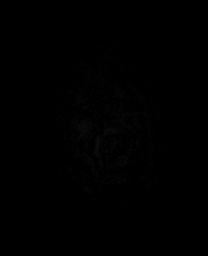

[Series 15: mip_images(sw) · axial · 24.0mm · 0.90mm/px · z∈[-107,+35]mm · 3 of 49 slices shown]
[im 1/49]
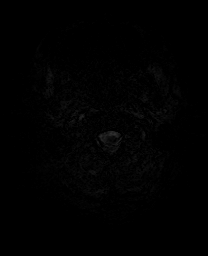
[im 25/49]
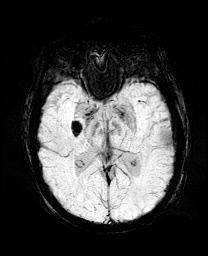
[im 49/49]
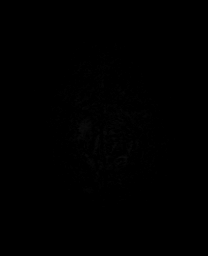

[Series 16: FLAIR · axial · 5.0mm · 0.90mm/px · z∈[-103,+39]mm · 2 of 25 slices shown (2 of 2)]
[im 1/25]
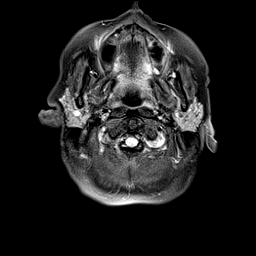
[im 25/25]
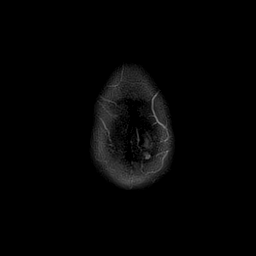

[Series 18: T2 · coronal · 5.0mm · 0.72mm/px · 2 of 32 slices shown (2 of 2)]
[im 1/32]
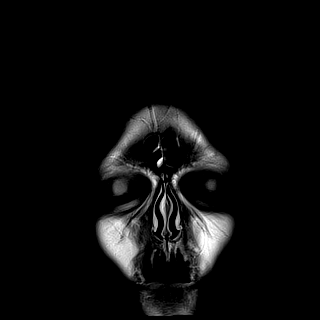
[im 32/32]
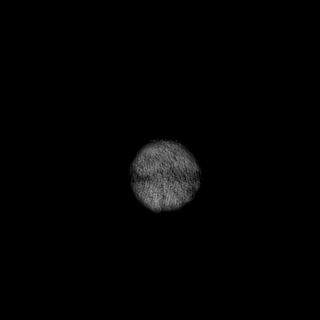

[44 of 48 positions shown; findings below may reference images not displayed]

FINDINGS: Brain: Restricted diffusion at the right striatum with mild
petechial hemorrhage. Borderline increased diffusion signal at the
adjacent right frontal and insular cortex, but no swelling or
hypointensity visualized on the ADC map. No hydrocephalus, mass, or
collection.

Vascular: Normal flow voids

Skull and upper cervical spine: Normal marrow signal

Sinuses/Orbits: Negative

Other: Intermittent motion artifact.
IMPRESSION: Acute infarction at the right striatum with petechial hemorrhage.

## 2023-02-28 DIAGNOSIS — R4583 Excessive crying of child, adolescent or adult: Secondary | ICD-10-CM | POA: Diagnosis not present

## 2023-02-28 DIAGNOSIS — I4891 Unspecified atrial fibrillation: Secondary | ICD-10-CM | POA: Diagnosis not present

## 2023-02-28 DIAGNOSIS — Z599 Problem related to housing and economic circumstances, unspecified: Secondary | ICD-10-CM | POA: Diagnosis not present

## 2023-02-28 DIAGNOSIS — R519 Headache, unspecified: Secondary | ICD-10-CM | POA: Diagnosis not present

## 2023-02-28 DIAGNOSIS — Z8349 Family history of other endocrine, nutritional and metabolic diseases: Secondary | ICD-10-CM | POA: Diagnosis not present

## 2023-02-28 DIAGNOSIS — F411 Generalized anxiety disorder: Secondary | ICD-10-CM | POA: Diagnosis not present

## 2023-02-28 DIAGNOSIS — L659 Nonscarring hair loss, unspecified: Secondary | ICD-10-CM | POA: Diagnosis not present

## 2023-02-28 DIAGNOSIS — R232 Flushing: Secondary | ICD-10-CM | POA: Diagnosis not present

## 2023-02-28 DIAGNOSIS — Z8673 Personal history of transient ischemic attack (TIA), and cerebral infarction without residual deficits: Secondary | ICD-10-CM | POA: Diagnosis not present

## 2023-03-01 NOTE — Progress Notes (Signed)
Kindly inform the patient that CT scan of the head shows no acute new or worrisome findings.:  Stroke is noted in the deep portion of the brain on the right side.

## 2023-03-05 ENCOUNTER — Telehealth: Payer: Self-pay

## 2023-03-05 NOTE — Telephone Encounter (Signed)
-----   Message from Micki Riley, MD sent at 03/01/2023  5:43 PM EDT ----- Joneen Roach inform the patient that CT scan of the head shows no acute new or worrisome findings.:  Stroke is noted in the deep portion of the brain on the right side.

## 2023-03-05 NOTE — Telephone Encounter (Signed)
Called patient and informed per Dr. [name] " Kindly inform the patient that CT scan of the head shows no acute new or worrisome findings.:  Stroke is noted in the deep portion of the brain on the right side." Pt verbalized understanding. Patient stated Dr. [name] want her to be apart of a trial and she believed she missed his call to be schedule. I informed her I would let him know and either Dr [name] or his team will call her back. Pt had no questions at this time but was encouraged to call back if questions arise.

## 2023-03-05 NOTE — Telephone Encounter (Signed)
-----   Message from Pramod S Sethi, MD sent at 03/01/2023  5:43 PM EDT ----- Kindly inform the patient that CT scan of the head shows no acute new or worrisome findings.:  Stroke is noted in the deep portion of the brain on the right side. 

## 2023-03-14 ENCOUNTER — Encounter: Payer: Self-pay | Admitting: Cardiology

## 2023-03-14 MED ORDER — AMIODARONE HCL 200 MG PO TABS: 200.0000 mg | ORAL_TABLET | Freq: Two times a day (BID) | ORAL | 3 refills | Status: DC

## 2023-03-23 ENCOUNTER — Ambulatory Visit (INDEPENDENT_AMBULATORY_CARE_PROVIDER_SITE_OTHER): Payer: 59

## 2023-03-23 DIAGNOSIS — I422 Other hypertrophic cardiomyopathy: Secondary | ICD-10-CM

## 2023-03-24 LAB — CUP PACEART REMOTE DEVICE CHECK
Battery Remaining Longevity: 93 mo
Battery Voltage: 3 V
Brady Statistic AP VP Percent: 69.9 %
Brady Statistic AP VS Percent: 23.17 %
Brady Statistic AS VP Percent: 0.72 %
Brady Statistic AS VS Percent: 6.21 %
Brady Statistic RA Percent Paced: 96.33 %
Brady Statistic RV Percent Paced: 70.62 %
Date Time Interrogation Session: 20240705014505
HighPow Impedance: 48 Ohm
Implantable Lead Connection Status: 753985
Implantable Lead Connection Status: 753985
Implantable Lead Implant Date: 20240329
Implantable Lead Implant Date: 20240329
Implantable Lead Location: 753859
Implantable Lead Location: 753860
Implantable Lead Model: 5076
Implantable Pulse Generator Implant Date: 20240329
Lead Channel Impedance Value: 304 Ohm
Lead Channel Impedance Value: 399 Ohm
Lead Channel Impedance Value: 418 Ohm
Lead Channel Pacing Threshold Amplitude: 1.25 V
Lead Channel Pacing Threshold Pulse Width: 0.4 ms
Lead Channel Sensing Intrinsic Amplitude: 1.4 mV
Lead Channel Sensing Intrinsic Amplitude: 15.9 mV
Lead Channel Setting Pacing Amplitude: 2.5 V
Lead Channel Setting Pacing Amplitude: 3.5 V
Lead Channel Setting Pacing Pulse Width: 0.4 ms
Lead Channel Setting Sensing Sensitivity: 0.3 mV
Zone Setting Status: 755011
Zone Setting Status: 755011
Zone Setting Status: 755011

## 2023-03-27 ENCOUNTER — Encounter: Payer: 59 | Admitting: Genetic Counselor

## 2023-04-06 NOTE — Progress Notes (Signed)
Remote ICD transmission.   

## 2023-04-08 DIAGNOSIS — J0140 Acute pansinusitis, unspecified: Secondary | ICD-10-CM | POA: Diagnosis not present

## 2023-04-12 ENCOUNTER — Ambulatory Visit: Payer: 59

## 2023-04-12 DIAGNOSIS — I422 Other hypertrophic cardiomyopathy: Secondary | ICD-10-CM

## 2023-04-12 LAB — CUP PACEART REMOTE DEVICE CHECK
Battery Remaining Longevity: 94 mo
Battery Voltage: 3 V
Brady Statistic RV Percent Paced: 96.25 %
Date Time Interrogation Session: 20240724224127
HighPow Impedance: 57 Ohm
Implantable Lead Connection Status: 753985
Implantable Lead Connection Status: 753985
Implantable Lead Implant Date: 20240329
Implantable Lead Implant Date: 20240329
Implantable Lead Location: 753859
Implantable Lead Location: 753860
Implantable Lead Model: 5076
Implantable Pulse Generator Implant Date: 20240329
Lead Channel Impedance Value: 418 Ohm
Lead Channel Pacing Threshold Amplitude: 1.25 V
Lead Channel Pacing Threshold Pulse Width: 0.4 ms
Lead Channel Sensing Intrinsic Amplitude: 1.6 mV
Lead Channel Sensing Intrinsic Amplitude: 12 mV
Lead Channel Setting Pacing Amplitude: 2 V
Lead Channel Setting Pacing Amplitude: 3.5 V
Lead Channel Setting Pacing Pulse Width: 0.4 ms
Zone Setting Status: 755011
Zone Setting Status: 755011

## 2023-04-13 DIAGNOSIS — J40 Bronchitis, not specified as acute or chronic: Secondary | ICD-10-CM | POA: Diagnosis not present

## 2023-04-13 DIAGNOSIS — I4891 Unspecified atrial fibrillation: Secondary | ICD-10-CM | POA: Diagnosis not present

## 2023-04-17 ENCOUNTER — Ambulatory Visit: Payer: 59 | Attending: Cardiology | Admitting: Cardiology

## 2023-04-17 ENCOUNTER — Encounter: Payer: Self-pay | Admitting: Cardiology

## 2023-04-17 VITALS — BP 104/74 | HR 70 | Ht 64.0 in | Wt 141.0 lb

## 2023-04-17 DIAGNOSIS — I472 Ventricular tachycardia, unspecified: Secondary | ICD-10-CM | POA: Diagnosis not present

## 2023-04-17 DIAGNOSIS — I422 Other hypertrophic cardiomyopathy: Secondary | ICD-10-CM

## 2023-04-17 DIAGNOSIS — I4819 Other persistent atrial fibrillation: Secondary | ICD-10-CM

## 2023-04-17 DIAGNOSIS — D6869 Other thrombophilia: Secondary | ICD-10-CM

## 2023-04-17 DIAGNOSIS — I4729 Other ventricular tachycardia: Secondary | ICD-10-CM | POA: Diagnosis not present

## 2023-04-17 LAB — CUP PACEART INCLINIC DEVICE CHECK
Date Time Interrogation Session: 20240730152255
Implantable Lead Connection Status: 753985
Implantable Lead Connection Status: 753985
Implantable Lead Implant Date: 20240329
Implantable Lead Implant Date: 20240329
Implantable Lead Location: 753859
Implantable Lead Location: 753860
Implantable Lead Model: 5076
Implantable Pulse Generator Implant Date: 20240329

## 2023-04-17 MED ORDER — METOPROLOL SUCCINATE ER 25 MG PO TB24: 25.0000 mg | ORAL_TABLET | Freq: Every day | ORAL | 6 refills | Status: DC

## 2023-04-17 NOTE — Progress Notes (Signed)
Electrophysiology Office Note:   Date:  04/17/2023  ID:  Hannah Zimmerman, DOB Jul 06, 1971, MRN [id number]  Primary Cardiologist: Julien Nordmann, MD Electrophysiologist: Reva Pinkley Jorja Loa, MD      History of Present Illness:   Hannah Zimmerman is a 52 y.o. female with h/o hypertrophic cardiomyopathy, atrial fibrillation seen today for routine electrophysiology followup.  Since last being seen in our clinic the patient reports doing overall well.  She continues to be able to do her daily activities.  She has had no further therapy from her ICD.  She does state that when she does activities around the house, at times her heart rate gets into the 130s.  She also continues to have discomfort at her ICD site with stinging and shooting pain which is mild.  she denies chest pain, palpitations, dyspnea, PND, orthopnea, nausea, vomiting, dizziness, syncope, edema, weight gain, or early satiety.   Review of systems complete and found to be negative unless listed in HPI.      EP Information / Studies Reviewed:    EKG is ordered today. Personal review as below.  EKG Interpretation Date/Time:  Tuesday April 17 2023 14:53:56 EDT Ventricular Rate:  70 PR Interval:  248 QRS Duration:  190 QT Interval:  522 QTC Calculation: 563 R Axis:   -34  Text Interpretation: AV dual-paced rhythm with prolonged AV conduction When compared with ECG of 03-Jan-2023 10:42, No significant change was found Confirmed by Arryn Terrones ([id number]) on 04/17/2023 3:00:25 PM   ICD Interrogation-  reviewed in detail today,  See PACEART report.  Device History: Medtronic Dual Chamber ICD implanted 12/15/22 for hypertrophic cardiomyopathy History of appropriate therapy: Yes History of AAD therapy: Yes; previously tolerated amiodarone    Risk Assessment/Calculations:    CHA2DS2-VASc Score = 4   This indicates a 4.8% annual risk of stroke. The patient's score is based upon: CHF History: 1 HTN History: 0 Diabetes History:  0 Stroke History: 2 Vascular Disease History: 0 Age Score: 0 Gender Score: 1             Physical Exam:   VS:  BP 104/74 (BP Location: Left Arm, Patient Position: Sitting, Cuff Size: Normal)   Pulse 70   Ht 5\' 4"  (1.626 m)   Wt 141 lb (64 kg)   SpO2 99%   BMI 24.20 kg/m    Wt Readings from Last 3 Encounters:  04/17/23 141 lb (64 kg)  02/01/23 149 lb 6.4 oz (67.8 kg)  01/16/23 151 lb (68.5 kg)     GEN: Well nourished, well developed in no acute distress NECK: No JVD; No carotid bruits CARDIAC: Regular rate and rhythm, no murmurs, rubs, gallops RESPIRATORY:  Clear to auscultation without rales, wheezing or rhonchi  ABDOMEN: Soft, non-tender, non-distended EXTREMITIES:  No edema; No deformity   ASSESSMENT AND PLAN:    1.  Hypertrophic cardiomyopathy s/p Medtronic dual chamber ICD  euvolemic today Stable on an appropriate medical regimen Normal ICD function See Pace Art report No changes today  2.  Persistent atrial fibrillation/flutter: Currently on Eliquis.  Status post ablation x 2 as well as surgical maze with left atrial appendage clipping.  No noted on device interrogation.  Her heart rates at times have been in the 130s.  Trinity Hyland start Toprol-XL 25 mg.  3.  Secondary hypercoagulable state: Currently on Eliquis for atrial fibrillation  4.  Ventricular tachycardia: Currently on amiodarone.  No further episodes.  5.  High risk medication monitoring: Currently on amiodarone.  Juluis Fitzsimmons  check TSH and LFTs today.  6.  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

## 2023-04-17 NOTE — Patient Instructions (Signed)
Medication Instructions:  Your physician has recommended you make the following change in your medication:  START Metoprolol Succinate (Toprol) 25 mg once daily at bedtime  *If you need a refill on your cardiac medications before your next appointment, please call your pharmacy*   Lab Work: None ordered   Testing/Procedures: None ordered   Follow-Up: At [facility], you and your health needs are our priority.  As part of our continuing mission to provide you with exceptional heart care, we have created designated Provider Care Teams.  These Care Teams include your primary Cardiologist (physician) and Advanced Practice Providers (APPs -  Physician Assistants and Nurse Practitioners) who all work together to provide you with the care you need, when you need it.  Remote monitoring is used to monitor your Pacemaker or ICD from home. This monitoring reduces the number of office visits required to check your device to one time per year. It allows [place] to keep an eye on the functioning of your device to ensure it is working properly. You are scheduled for a device check from home on 06/22/2023. You may send your transmission at any time that day. If you have a wireless device, the transmission will be sent automatically. After your physician reviews your transmission, you will receive a postcard with your next transmission date.  Your next appointment:   6 month(s) in [place]  The format for your next appointment:   In Person  Provider:   Sherie Don, [facility]   Thank you for choosing [facility]!!   Dory Horn, RN [phone]    Other Instructions  Metoprolol Extended-Release Tablets What is this medication? METOPROLOL (me TOE proe lole) treats high blood pressure and heart failure. It may also be used to prevent chest pain (angina). It works by lowering your blood pressure and heart rate, making it easier for your heart to pump blood to the rest of your body. It belongs  to a group of medications called beta blockers. This medicine may be used for other purposes; ask your health care provider or pharmacist if you have questions. COMMON BRAND NAME(S): toprol, Toprol XL, Toprol-XL What should I tell my care team before I take this medication? They need to know if you have any of these conditions: Diabetes Heart or vessel disease, such as slow heartbeat, worsening heart failure, heart block, sick sinus syndrome, or Raynaud syndrome Kidney disease Liver disease Lung or breathing disease, such as asthma or emphysema Pheochromocytoma Thyroid disease An unusual or allergic reaction to metoprolol, other medications, foods, dyes, or preservatives Pregnant or trying to get pregnant Breastfeeding How should I use this medication? Take this medication by mouth. Take it as directed on the prescription label at the same time every day. Take it with food. You may cut the tablet in half if it is scored (has a line in the middle of it). This may help you swallow the tablet if the whole tablet is too big. Be sure to take both halves. Do not take just one-half of the tablet. Keep taking it unless your care team tells you to stop. Talk to your care team about the use of this medication in children. While it may be prescribed for children as young as 6 years for selected conditions, precautions do apply. Overdosage: If you think you have taken too much of this medicine contact a poison control center or emergency room at once. NOTE: This medicine is only for you. Do not share this medicine with others. What  if I miss a dose? If you miss a dose, take it as soon as you can. If it is almost time for your next dose, take only that dose. Do not take double or extra doses. What may interact with this medication? Certain medications for blood pressure, heart disease, irregular heartbeat NSAIDS, medications for pain and inflammation, such as ibuprofen or naproxen This list may not  describe all possible interactions. Give your health care provider a list of all the medicines, herbs, non-prescription drugs, or dietary supplements you use. Also tell them if you smoke, drink alcohol, or use illegal drugs. Some items may interact with your medicine. What should I watch for while using this medication? Visit your care team for regular checks on your progress. Check your blood pressure as directed. Know what your blood pressure should be and when to contact your care team. This medication may affect your coordination, reaction time, or judgment. Do not drive or operate machinery until you know how this medication affects you. Sit up or stand slowly to reduce the risk of dizzy or fainting spells. Drinking alcohol with this medication can increase the risk of these side effects. Do not suddenly stop taking this medication. This may increase your risk of side effects, such as chest pain and heart attack. If you no longer need to take this medication, your care team will lower the dose slowly over time to decrease the risk of side effects. If you are going to need surgery or a procedure, tell your care team that you are using this medication. This medication may affect blood glucose levels. It can also mask the symptoms of low blood sugar, such as a rapid heartbeat and tremors. If you have diabetes, it is important to check your blood sugar often while you are taking this medication. Do not treat yourself for coughs, colds, or pain while you are using this medication without asking your care team for advice. Some medications may increase your blood pressure. What side effects may I notice from receiving this medication? Side effects that you should report to your care team as soon as possible: Allergic reactions--skin rash, itching, hives, swelling of the face, lips, tongue, or throat Heart failure--shortness of breath, swelling of the ankles, feet, or hands, sudden weight gain, unusual  weakness or fatigue Low blood pressure--dizziness, feeling faint or lightheaded, blurry vision Raynaud's--cool, numb, or painful fingers or toes that may change color from pale, to blue, to red Slow heartbeat--dizziness, feeling faint or lightheaded, confusion, trouble breathing, unusual weakness or fatigue Worsening mood, feelings of depression Side effects that usually do not require medical attention (report to your care team if they continue or are bothersome): Change in sex drive or performance Diarrhea Dizziness Fatigue Headache This list may not describe all possible side effects. Call your doctor for medical advice about side effects. You may report side effects to FDA at 1-800-FDA-1088. Where should I keep my medication? Keep out of the reach of children and pets. Store at room temperature between 20 and 25 degrees C (68 and 77 degrees F). Throw away any unused medication after the expiration date. NOTE: This sheet is a summary. It may not cover all possible information. If you have questions about this medicine, talk to your doctor, pharmacist, or health care provider.  2024 [employer]vier/Gold Standard (2022-09-04 00:00:00)

## 2023-04-20 NOTE — Progress Notes (Signed)
Remote ICD transmission.   

## 2023-04-21 ENCOUNTER — Other Ambulatory Visit: Payer: Self-pay | Admitting: Cardiology

## 2023-04-21 DIAGNOSIS — I4819 Other persistent atrial fibrillation: Secondary | ICD-10-CM

## 2023-04-23 NOTE — Telephone Encounter (Signed)
Prescription refill request for Eliquis received. Indication:afib Last office visit:7/24 Scr:0.96  4/24 Age: 52 Weight:64  kg  Prescription refilled

## 2023-04-27 ENCOUNTER — Encounter: Payer: Self-pay | Admitting: Cardiology

## 2023-04-30 DIAGNOSIS — R0789 Other chest pain: Secondary | ICD-10-CM | POA: Diagnosis not present

## 2023-04-30 DIAGNOSIS — S46812A Strain of other muscles, fascia and tendons at shoulder and upper arm level, left arm, initial encounter: Secondary | ICD-10-CM | POA: Diagnosis not present

## 2023-05-07 ENCOUNTER — Other Ambulatory Visit: Payer: Self-pay | Admitting: Neurosurgery

## 2023-05-07 ENCOUNTER — Inpatient Hospital Stay: Admission: RE | Admit: 2023-05-07 | Payer: 59 | Source: Ambulatory Visit

## 2023-05-07 DIAGNOSIS — E2839 Other primary ovarian failure: Secondary | ICD-10-CM

## 2023-05-16 DIAGNOSIS — R053 Chronic cough: Secondary | ICD-10-CM | POA: Diagnosis not present

## 2023-05-16 DIAGNOSIS — M542 Cervicalgia: Secondary | ICD-10-CM | POA: Diagnosis not present

## 2023-05-23 ENCOUNTER — Telehealth: Payer: Self-pay

## 2023-05-23 NOTE — Telephone Encounter (Addendum)
Reviewed transmission. Normal Device Function.  Device likely beeped due to electronics/battery in the laptop that she was clutching to her chest. Do not see any abnormality with device.  No episodes/no therapies. Patient is doing well, no symptoms or concerns.   I do note that her optivol is starting to trend up so I did ensure she was taking her Lasix and watching her salt.  She knows to follow up with Dr. Mariah Milling if signs of fluid retention continue (which she has noted in her extremities).

## 2023-05-23 NOTE — Telephone Encounter (Signed)
After reviewing waveform templates with [place], MDT, it is recommended that we bring patient in in next 1-2 weeks to clean up waveform templates for optimal function.  This is not an emergency and not why the device beeped.  I discussed with patient and explained.  Appt made this Friday, 9/6 with device. Industry to be present.   Thanks.

## 2023-05-23 NOTE — Telephone Encounter (Signed)
Pt called in stating she was holding a laptop and her grandbaby ran into her and the laptop hit her ICD and she heard an alarm. Pt is sending in a transmission and would like someone to call her back and let her know what they see

## 2023-05-25 ENCOUNTER — Ambulatory Visit: Payer: 59 | Attending: Internal Medicine

## 2023-05-25 DIAGNOSIS — I4891 Unspecified atrial fibrillation: Secondary | ICD-10-CM

## 2023-05-25 LAB — CUP PACEART INCLINIC DEVICE CHECK
Date Time Interrogation Session: 20240906142836
Implantable Lead Connection Status: 753985
Implantable Lead Connection Status: 753985
Implantable Lead Implant Date: 20240329
Implantable Lead Implant Date: 20240329
Implantable Lead Location: 753859
Implantable Lead Location: 753860
Implantable Lead Model: 5076
Implantable Pulse Generator Implant Date: 20240329

## 2023-05-25 NOTE — Progress Notes (Signed)
Patient seen in device clinic to obtain wave template. Please see attachment for details. This was obtained by [employer] rep Liborio Nixon.

## 2023-05-29 NOTE — Progress Notes (Signed)
[facility] [address] [place]. [place] [address] [phone]       OFFICE FOLLOW UP NOTE  [name] Date of Birth:  1970-09-30 Medical Record Number:  [id number]    Primary neurologist: Dr. [name] Reason for visit: Stroke follow-up, tension headaches   Chief complaint: Chief Complaint  Patient presents with   Follow-up    Rm 3, here with daughter Hannah Zimmerman Pt is here following up on headaches, hx of stroke. Pt states she is having a headache everyday, Topiramate is not helping. Pt states she has been having residual symptoms like slurred speech, brain fog and losing functionality on left arm/hand. States she feels sx's are progressing.       HPI:  Update 05/30/2023 JM: Patient returns for follow-up visit accompanied by her daughter.  At prior visit, complained of daily bitemporal headaches, suspected tension type headaches with analgesic rebound component and cervical muscle tightness, started on topiramate 25 mg BID. Repeat CTH no new or acute findings.   Today, she complains of persistent daily headaches, denies benefit with topiramate 25 mg BID, reports worsening brain fog on current dosage.  She can have worsening headaches about 3x per week which are debilitating.  Located occipital mainly but can be generalized at times. Will use Tylenol only occasionally but denies benefit.  Does have chronic neck pain, recent cervical x-ray showed degenerative disc disease worse at C5-C6 and C6-C7, reports PCP plans on sending to specialist for further evaluation.   She also mentions fluctuation of left hand weakness with numbness/tingling, slurred speech and imbalance. This has been persistent since her stroke. Denies correlation with specific triggers such as fatigue, stress, overexertion or headaches. She tries to stay active, plans on starting water aerobics soon. Denies new stroke/TIA symptoms.  Remains on Eliquis, aspirin and pravastatin.  Routinely follows  with PCP and cardiology for stroke risk factor management.     History provided for reference purposes only Update 02/01/2023 Dr. [name] : She returns for follow-up after last visit 6 months ago.  She states she is doing well.  She has had no recurrent stroke or TIA symptoms.  She had an ICD placed on 12/15/2022 by Dr. [name] and a week later while in the grocery store she felt sharp.  He is rehospitalized and found to have rapid heart rate and started on amiodarone.  He has done well since then.  She however has a new complaint of daily headaches that she is after last 6 months.  She describes  these as bitemporal moderate severity occasionally spreading to the back of her head.  There is no accompanying nausea or vomiting or light or sound sensitivities.  She does have to lie down sometimes to get relief.  She takes Tylenol which takes the edge off but headache does not completely go away.  She does admit to some muscle tightness in the back of her head and neck.  Has no prior history of similar headaches.  She does have remote history of migraine headaches but those are quite different.  She is unable to identify specific triggers or relieving factors for the headaches.  She has not had any recent brain imaging done.  She remains on Eliquis which she is tolerating well with easy bruising but no bleeding episodes.  She is tolerating Pravachol well without muscle aches and pains.  Last lipid profile on 10/29/2022 showed LDL cholesterol to be 75 mg percent.  Hemoglobin A1c was borderline at 6.1.  She continues  to have mild short-term memory difficulties but these stable and nonprogressive.  She is independent in all activities of daily living.  Update 08/02/2022 Dr. [name]; she returns for follow-up after last visit with  me 6 months ago.  She is accompanied by her daughter.  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  Surgery went well without complications but she has noticed worsening of her existing stroke symptoms with speech and word finding difficulties, depth perception on the right and she is tired.  She denies any new strokelike symptoms.  She is back on Eliquis and is tolerating it well without bleeding or bruising.  She is tolerating Lipitor well without muscle aches.  Her blood pressure is under good control and today it is 130/93.  Having mild short-term memory difficulties and trouble remembering recent information.  He has not been participating in cognitively challenging activities.  He has not had any evaluation for her memory loss.  Initial visit 01/18/22 Dr. [name] ; Hannah Zimmerman is a 52 year old pleasant Caucasian lady seen today for initial office consultation visit for stroke.  History is obtained from the patient and her daughter as well as review of electronic medical records and opossum reviewed pertinent available imaging films in PACS.  She has past medical history of atrial fibrillation/flutter, congestive heart failure, tobacco abuse who presented to [facility] on [facility]12[facility]2021 with sudden onset of lethargy, left-sided weakness, slurred speech and gaze deviation.  NIH stroke scale was 24 on admission.  She was not a candidate for IV thrombolysis as she was on Eliquis.  Noncontrast CAT scan of the head showed aspect score of 10 with developing some hypodensity in the right hemisphere and dense right MCA sign.  CT angiogram of the head and neck showed nonopacification proximal to mid right ICA extending into the intracranial portion of the terminus and proximal right MCA.  Patient was emergently transferred to [facility] and underwent successful mechanical thrombectomy with TICI3 revascularization by Dr. Corliss Skains.  She was kept in the intensive care unit and closely monitored and blood pressure was tightly controlled.  She did very well and noticed significant  improvement in the left-sided deficits.  Echocardiogram showed ejection fraction of 60 to 65% with severe concentric left ventricular hypertrophy.  LDL cholesterol was 87 mg percent.  Hemoglobin A1c of 6.1.  MRI scan of the brain shows patchy infarction in the right basal ganglia with some mild petechial hemorrhage.  There was also subtle diffusion signal in the adjacent right frontal and insular cortex.  Discussion was made about switching Eliquis to alternative anticoagulants but patient's insurance co-pay would be quite high and hence it was decided to continue Eliquis.  Patient was discharged home with home physical and Occupational Therapy.  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  The patient states her left-sided deficits have mostly resolved.  She has cut back smoking but still smokes about 6 cigarettes a day.  She is complaining of chronic headaches since her stroke.  These are mostly in the back of the head and top of the neck they are mild to moderate dull in quality.  No accompanying light or sound sensitivity or nausea.  She takes Tylenol 2 tablets twice daily for last several months.  She does admit to significant increase stress in her life and  she has not been participating in any stress laxation activity.  She does complain of decreased strength and difficulty with focusing and plans to see an eye doctor.     ROS:   14 system review of systems is positive for those listed in HPI and all other systems negative  PMH:  Past Medical History:  Diagnosis Date   (HFpEF) heart failure with preserved ejection fraction (HCC)    a. 07/2019 Echo: EF 60-65%, mod LVH. Sev dil LA, mildly dil RA. Mod elev PASP; b. 07/2020 TEE: EF 50-55%, no rwma, sev conc LVH. Nl RV fxn. Mod dil RA. Mild-mod MR, mod TR.   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 07/2020 Cardiac CT: Cor Ca2+ = 338  (99th %'ile).   Complication of anesthesia 20 years ago   a. spinal with first c section went too high stopped breathing, low bp with 2nd c section, no further issues with anesthesia   Heart murmur    a. 07/2019 Echo: no significant valvular dzs; b. 07/2020 TEE: Mild-mod MR, mod TR.   History of hiatal hernia    Hypertrophic cardiomyopathy (HCC)    a. 07/2019 Echo: Mod LVH; b. 01/2020 cMRI: EF 67%, HCM w/o obstruction. Max wall thickness 19mm. Sev LAE w/ L->R atrial level shunt. Large area of LGE @ mid-ventricular level in area of max wall thickness; c. 07/2020 Cardiac CT: Asymm hypertrophy up to 19mm in mid inferoseptum consistent w/ known HCM.   Morbid obesity (HCC)    a. 09/2014 s/p gastric bypass.   Persistent atrial fibrillation (HCC)    a. Dx 07/2019-->CHA2DS2VASc = 2 (diast CHF/Fem)-->Eliquis; b. 09/2018 s/p DCCV (150J (biphasic) x 1); c. 12/2019 RFCA/PVI; d. 07/2020 repeat RFCA/PVI.   Rash    both arms from old bed bug bites, healing   Stroke [facility])    Tobacco abuse    Typical atrial flutter (HCC)    a. 12/2019 s/p RFCA.    Social History:  Social History   Socioeconomic History   Marital status: Significant Other    Spouse name: Not on file   Number of children: 3   Years of education: two years of college   Highest education level: Not on file  Occupational History   Occupation: office assistant  Tobacco Use   Smoking status: Former    Current packs/day: 0.00    Average packs/day: 0.3 packs/day for 31.0 years (7.8 ttl pk-yrs)    Types: Cigarettes    Start date: 10/04/1991    Quit date: 10/03/2022    Years since quitting: 0.6    Passive exposure: Current   Smokeless tobacco: Never  Vaping Use   Vaping status: Never Used  Substance and Sexual Activity   Alcohol use: Yes    Alcohol/week: 1.0 - 2.0 standard drink of alcohol    Types: 1 - 2 Standard drinks or equivalent per week    Comment: Occaional use - 1-2 mixed drinks prior to stroke since d/c nothing 11/30/21   Drug  use: No   Sexual activity: Yes  Other Topics Concern   Not on file  Social History Narrative   Lives at home with significant other.   Some caffeine daily I her green tea.   Right-handed.   Social Determinants of Health   Financial Resource Strain: High Risk (03/05/2023)   Received from [facility], [facility]   Overall Financial Resource Strain (CARDIA)    Difficulty of Paying Living  Expenses: Very hard  Food Insecurity: Food Insecurity Present (03/05/2023)   Received from [facility], [facility]   Hunger Vital Sign    Worried About Running Out of Food in the Last Year: Often true    Ran Out of Food in the Last Year: Often true  Transportation Needs: No Transportation Needs (03/05/2023)   Received from [facility], [facility]   [facility] - Transportation    In the past 12 months, has lack of transportation kept you from medical appointments or from getting medications?: No    Lack of Transportation (Non-Medical): No  Physical Activity: Insufficiently Active (03/05/2023)   Received from [facility], [facility]   Exercise Vital Sign    Days of Exercise per Week: 7 days    Minutes of Exercise per Session: 20 min  Stress: Stress Concern Present (03/05/2023)   Received from [facility], [facility]   [employer] [facility] Occupational [facility] - Occupational Stress Questionnaire    Feeling of Stress : To some extent  Social Connections: Moderately Isolated (03/05/2023)   Received from [facility], [facility]   Social Connection and Isolation Panel [NHANES]    Frequency of Communication with Friends and Family: More than three times a week    Frequency of Social Gatherings with Friends and Family: More than three times a week    Attends Religious Services: More than 4 times  per year    Active Member of [employer] or Organizations: No    Attends Banker Meetings: Never    Marital Status: Divorced  Catering manager Violence: Not At Risk (01/03/2023)   Humiliation, Afraid, Rape, and Kick questionnaire    Fear of Current or Ex-Partner: No    Emotionally Abused: No    Physically Abused: No    Sexually Abused: No    Medications:   Current Outpatient Medications on File Prior to Visit  Medication Sig Dispense Refill   acetaminophen (TYLENOL) 500 MG tablet Take 500 mg by mouth every 6 (six) hours as needed for moderate pain or headache.     amiodarone (PACERONE) 200 MG tablet Take 1 tablet (200 mg total) by mouth 2 (two) times daily. 180 tablet 3   aspirin EC 81 MG tablet Take 1 tablet (81 mg total) by mouth daily. Swallow whole. 30 tablet 12   b complex vitamins capsule Take 1 capsule by mouth daily.     cholecalciferol (VITAMIN D3) 25 MCG (1000 UNIT) tablet Take 1,000 Units by mouth daily.     ELIQUIS 5 MG TABS tablet TAKE 1 TABLET BY MOUTH TWICE A DAY 60 tablet 5   furosemide (LASIX) 40 MG tablet Take 0.5 tablets (20 mg total) by mouth daily. Take an additional 20mg  tablet as needed (Patient taking differently: Take 40 mg by mouth daily.) 45 tablet 3   gabapentin (NEURONTIN) 100 MG capsule Take 100 mg by mouth at bedtime.     Omega-3 Fatty Acids (FISH OIL) 1000 MG CAPS Take 100 mg by mouth daily.     potassium chloride SA (KLOR-CON M20) 20 MEQ tablet Take 1 tablet once daily, with Lasix, for 7 days (Patient taking differently: Take 20 mEq by mouth daily.) 7 tablet 0   pravastatin (PRAVACHOL) 20 MG tablet Take 20 mg by mouth daily.     topiramate (TOPAMAX) 25 MG tablet Take 1 tablet (25 mg total) by mouth 2 (  two) times daily. 120 tablet 3   cyclobenzaprine (FLEXERIL) 10 MG tablet Take 10 mg by mouth at bedtime. (Patient not taking: Reported on 05/30/2023)     metoprolol succinate (TOPROL XL) 25 MG 24 hr tablet Take 1 tablet (25 mg total) by mouth daily.  (Patient not taking: Reported on 05/30/2023) 30 tablet 6   midodrine (PROAMATINE) 5 MG tablet Take 3 tablets (15 mg total) by mouth 2 (two) times daily with a meal. (Patient not taking: Reported on 04/17/2023) 180 tablet 7   pantoprazole (PROTONIX) 20 MG tablet Take 20 mg by mouth daily. (Patient not taking: Reported on 04/17/2023)     predniSONE (DELTASONE) 10 MG tablet Take 10 mg by mouth 2 (two) times daily with a meal. (Patient not taking: Reported on 05/30/2023)     rOPINIRole (REQUIP) 0.5 MG tablet Take 0.5 mg by mouth at bedtime. (Patient not taking: Reported on 04/17/2023)     No current facility-administered medications on file prior to visit.    Allergies:   Allergies  Allergen Reactions   Atorvastatin Nausea Only   Atarax [Hydroxyzine] Palpitations   Codeine Hives        Hydrocodone Hives    Physical Exam Today's Vitals   05/30/23 0746  BP: 91/60  Pulse: 71  Weight: 144 lb (65.3 kg)  Height: 5\' 4"  (1.626 m)   Body mass index is 24.72 kg/m.   General: well developed, well nourished, pleasant middle-age Caucasian female, seated, in no evident distress Head: head normocephalic and atraumatic.   Neck: supple with no carotid or supraclavicular bruits Cardiovascular: regular rate and rhythm, no murmurs Musculoskeletal: no deformity Skin:  no rash/petichiae Vascular:  Normal pulses all extremities  Neurologic Exam Mental Status: Awake and fully alert.  No evidence of aphasia or dysarthria.  Oriented to place and time. Recent and remote memory intact. Attention span, concentration and fund of knowledge appropriate. Mood and affect appropriate. Cranial Nerves: Pupils equal, briskly reactive to light. Extraocular movements full without nystagmus. Visual fields full to confrontation. Hearing intact. Facial sensation intact. Face, tongue, palate moves normally and symmetrically.  Motor: Normal bulk and tone. Normal strength in all tested extremity muscles except slightly weak  left grip strength.  Diminished fine finger movements on the left.  Orbits right over left upper extremity. Sensory.: intact to touch , pinprick , position and vibratory sensation.  Coordination: Rapid alternating movements slightly impaired left hand. Finger-to-nose and heel-to-shin performed accurately bilaterally. Gait and Station: Arises from chair without difficulty. Stance is normal. Gait demonstrates normal stride length and balance without use of AD Reflexes: 1+ and symmetric. Toes downgoing.         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  Patient has been extremely well with hardly any residual deficits.  Vascular risk factors of atrial fibrillation, hyperlipidemia, hypertension and smoking.  She has had worsening of her stroke deficits following an open heart surgery in June 2023 along with mild cognitive impairment which appears stable.  Complaints of daily persistent headache since around 08/2022 likely transformed tension headaches with analgesic rebound, CTH negative for new findings.    PLAN  Tension headaches Start amitriptyline 25 mg nightly Stop topiramate as causing brain fog sensation Avoid overuse over OTC pain relievers due to potential rebound headache Does have neck pain which may be contributing to persistent headaches - PCP  referring to specialist for further evaluation  R MCA stroke Reports fluctuation of left hand weakness with numbness, slurred speech and balance. Present since stroke onset. Unsure if due to persistent headaches, possible underlying anxiety/depression and/or fatigue.  Declines interest in therapy. Unable to appreciate new or worsening deficits on exam today.  Continue Eliquis,aspirin and pravastatin for secondary stroke prevention measures managed by  PCP/cardiology. Reports use of aspirin recommended by cardiology, is not needed from a stroke standpoint.  Continue close follow-up with PCP for stroke risk factor management including BP goal<130/90 and HLD with LDL goal<70    Follow-up in 6 months or call earlier if needed    I spent 40 minutes of face-to-face and non-face-to-face time with patient and daughter.  This included previsit chart review, lab review, study review, order entry, electronic health record documentation, patient education and discussion regarding above diagnoses and treatment plan and answered all other questions to patient and daughters satisfaction  Ihor Austin, [facility]  [facility] [address] [place], [place] [address]  Phone [phone] Fax [phone] Note: This document was prepared with digital dictation and possible smart phrase technology. Any transcriptional errors that result from this process are unintentional.

## 2023-05-30 ENCOUNTER — Other Ambulatory Visit: Payer: Self-pay | Admitting: Adult Health

## 2023-05-30 ENCOUNTER — Encounter: Payer: Self-pay | Admitting: Adult Health

## 2023-05-30 ENCOUNTER — Ambulatory Visit (INDEPENDENT_AMBULATORY_CARE_PROVIDER_SITE_OTHER): Payer: 59 | Admitting: Adult Health

## 2023-05-30 VITALS — BP 91/60 | HR 71 | Ht 64.0 in | Wt 144.0 lb

## 2023-05-30 DIAGNOSIS — Z8673 Personal history of transient ischemic attack (TIA), and cerebral infarction without residual deficits: Secondary | ICD-10-CM

## 2023-05-30 DIAGNOSIS — G44229 Chronic tension-type headache, not intractable: Secondary | ICD-10-CM

## 2023-05-30 MED ORDER — NURTEC 75 MG PO TBDP: 75.0000 mg | ORAL_TABLET | Freq: Every day | ORAL | 5 refills | Status: DC | PRN

## 2023-05-30 MED ORDER — AMITRIPTYLINE HCL 25 MG PO TABS: 25.0000 mg | ORAL_TABLET | Freq: Every day | ORAL | 2 refills | Status: DC

## 2023-05-30 NOTE — Patient Instructions (Signed)
Recommend starting amitriptyline 25mg  nightly for headache prevention - after 1-2 weeks if no benefit, we can consider dosage increase but please let me know sooner if any difficulty tolerating   Please follow up with PCP regarding further neck pain evaluation as this could be contributing to persistent headaches   Continue  Eliquis and aspirin   and pravastatin for secondary stroke prevention  Continue to follow up with PCP regarding blood pressure and cholesterol management  Maintain strict control of hypertension with blood pressure goal below 130/90 and cholesterol with LDL cholesterol (bad cholesterol) goal below 70 mg/dL.   Signs of a Stroke? Follow the BEFAST method:  Balance Watch for a sudden loss of balance, trouble with coordination or vertigo Eyes Is there a sudden loss of vision in one or both eyes? Or double vision?  Face: Ask the person to smile. Does one side of the face droop or is it numb?  Arms: Ask the person to raise both arms. Does one arm drift downward? Is there weakness or numbness of a leg? Speech: Ask the person to repeat a simple phrase. Does the speech sound slurred/strange? Is the person confused ? Time: If you observe any of these signs, call 911.     Followup in the future with me in 6 months or call earlier if needed     Thank you for coming to see [place] at [facility]. I hope we have been able to provide you high quality care today.  You may receive a patient satisfaction survey over the next few weeks. We would appreciate your feedback and comments so that we may continue to improve ourselves and the health of our patients.

## 2023-05-31 ENCOUNTER — Other Ambulatory Visit (HOSPITAL_COMMUNITY): Payer: Self-pay

## 2023-05-31 ENCOUNTER — Telehealth: Payer: Self-pay

## 2023-05-31 NOTE — Telephone Encounter (Signed)
Pharmacy Patient Advocate Encounter   Received notification from RX Request Messages that prior authorization for Nurtec 75MG  dispersible tablets is required/requested.   Insurance verification completed.   The patient is insured through [facility] .   Per test claim: PA required; PA submitted to [facility] via CoverMyMeds Key/confirmation #/EOC [id number] Status is pending

## 2023-06-03 ENCOUNTER — Other Ambulatory Visit (HOSPITAL_COMMUNITY): Payer: Self-pay

## 2023-06-08 NOTE — Telephone Encounter (Signed)
Pharmacy Patient Advocate Encounter  Received notification from [employer] [facility] that Prior Authorization for Nurtec has been DENIED.  Full denial letter will be uploaded to the media tab. See denial reason below.   PA #/Case ID/Reference #: Your plan only covers this drug when you meet one of these options: A) You have tried other products your plan covers (preferred products), and they did not work well for you, or B) Your doctor gives [place] a medical reason you cannot take those other products. For your plan, you may need to try up to three preferred products. We have denied your request because you do not meet any of these conditions. We reviewed the information we had. Your request has been denied. Your doctor can send [place] any new or missing information for [place] to review. The preferred products for your plan are: Bernita Raisin. Your doctor may need to get approval from your plan for preferred products. For this drug, you may have to meet other criteria. You can request the drug policy for more details. You can also request other plan documents for your review.

## 2023-06-11 ENCOUNTER — Encounter: Payer: Self-pay | Admitting: Adult Health

## 2023-06-17 NOTE — Progress Notes (Unsigned)
Patient ID: [name], female   DOB: [birth year], 52 y.o.   MRN: [id number] Cardiology Office Note  Date:  06/18/2023   ID:  Hannah Zimmerman, DOB 01-10-1971, MRN [id number]  PCP:  Wilford Corner, PA-C   Chief Complaint  Patient presents with   Follow-up    "Doing well." Patient c/o constipation x 5 days. Medications reviewed by the patient verbally.     HPI:  Hannah Zimmerman is a very pleasant 52 year old woman with history of  Moderate LVH, no outflow tract obstruction, maximal wall thickness 19 mm Severe left atrial enlargement Atrial fibrillation, ablation x 2 as well as surgical maze with left atrial appendage clipping ICD Cryptogenic stroke, aortic fibroblastoma, postsurgical removal obesity,  Smoking,  previous history of tachycardia,  pulmonary hypertension  Gastric bypass surgery 07/2015, down 130 pounds, stable weight Hx of VT who presents for routine follow-up of her tachycardia and pulmonary hypertension, atrial fibrillation  Last seen by myself in clinic June 2021 Seen numerous times by EP, last in April 17, 2023 Continues to have paroxysmal tachycardia Recalls receiving defibrillator shock On Eliquis, amiodarone 200 twice daily Unable to tolerate beta-blocker secondary to bradycardia  Prior cardiac procedures reviewed S/p EP study 12/24/2019 1. Persistent atrial fibrillation. 2. Typical appearing atrial flutter  CONCLUSIONS: 1. Sinus rhythm upon presentation.   2. Successful electrical isolation and anatomical encircling of all four pulmonary veins with radiofrequency current.   Further ablation across the roof of the left atrium. 3. Cavo-tricuspid isthmus ablation was performed with complete bidirectional isthmus block achieved.  4. No inducible arrhythmias following ablation both on and off of dobutamine 5.  Cardioversion  No regular exercise program Gives out when walking Requesting handicap sticker  On Lasix 40 daily If she holds Lasix, will  get ankle swelling Blood pressure runs low, periodic orthostasis symptoms  EKG personally reviewed by myself on todays visit EKG Interpretation Date/Time:  Monday June 18 2023 16:11:17 EDT Ventricular Rate:  69 PR Interval:  248 QRS Duration:  208 QT Interval:  566 QTC Calculation: 606 R Axis:   -37  Text Interpretation: AV dual-paced rhythm with prolonged AV conduction When compared with ECG of 17-Apr-2023 14:53, No significant change was found Confirmed by Julien Nordmann [phone]) on 06/18/2023 4:29:05 PM     PMH:   has a past medical history of (HFpEF) heart failure with preserved ejection fraction (HCC), Agatston coronary artery calcium score between 200 and 399, Complication of anesthesia (20 years ago), Heart murmur, History of hiatal hernia, Hypertrophic cardiomyopathy (HCC), Morbid obesity (HCC), Persistent atrial fibrillation (HCC), Rash, Stroke (HCC), Tobacco abuse, and Typical atrial flutter (HCC).  PSH:    Past Surgical History:  Procedure Laterality Date   ABDOMINAL HYSTERECTOMY     ANKLE SURGERY     left x 2   ATRIAL FIBRILLATION ABLATION N/A 12/24/2019   Procedure: ATRIAL FIBRILLATION ABLATION;  Surgeon: Regan Lemming, MD;  Location: [facility];  Service: Cardiovascular;  Laterality: N/A;   ATRIAL FIBRILLATION ABLATION N/A 08/11/2020   Procedure: ATRIAL FIBRILLATION ABLATION;  Surgeon: Regan Lemming, MD;  Location: [facility];  Service: Cardiovascular;  Laterality: N/A;   CARDIOVERSION N/A 09/24/2019   Procedure: CARDIOVERSION;  Surgeon: Antonieta Iba, MD;  Location: [facility] ORS;  Service: Cardiovascular;  Laterality: N/A;   CARDIOVERSION N/A 10/17/2019   Procedure: CARDIOVERSION;  Surgeon: Antonieta Iba, MD;  Location: [facility] ORS;  Service: Cardiovascular;  Laterality: N/A;   CARDIOVERSION N/A 11/08/2021  Procedure: CARDIOVERSION;  Surgeon: Antonieta Iba, MD;  Location: [facility] ORS;  Service: Cardiovascular;  Laterality: N/A;    CESAREAN SECTION     x 3   CLIPPING OF ATRIAL APPENDAGE N/A 03/06/2022   Procedure: CLIPPING OF ATRIAL APPENDAGE USING ATRICURE  CLIP SIZE ;  Surgeon: Loreli Slot, MD;  Location: [facility] OR;  Service: Open Heart Surgery;  Laterality: N/A;   EXCISION OF ATRIAL MYXOMA N/A 03/06/2022   Procedure: RESECTION OF AORTIC VALVE TUMOR;  Surgeon: Loreli Slot, MD;  Location: [facility] OR;  Service: Open Heart Surgery;  Laterality: N/A;   HERNIA REPAIR     x2 one was for sure a hiatal hernia   ICD IMPLANT N/A 12/15/2022   Procedure: ICD IMPLANT;  Surgeon: Regan Lemming, MD;  Location: [facility] INVASIVE CV LAB;  Service: Cardiovascular;  Laterality: N/A;   IR ANGIO EXTRACRAN SEL COM CAROTID INNOMINATE UNI L MOD SED  11/13/2021   IR CT HEAD LTD  11/13/2021   IR PERCUTANEOUS ART THROMBECTOMY/INFUSION INTRACRANIAL INC DIAG ANGIO  11/13/2021   IR THORACENTESIS ASP PLEURAL SPACE W/IMG GUIDE  03/14/2022   LAPAROSCOPIC GASTRIC SLEEVE RESECTION  2020   LAPAROSCOPIC GASTRIC SLEEVE RESECTION WITH HIATAL HERNIA REPAIR  08/17/2015   Procedure: LAPAROSCOPIC GASTRIC SLEEVE RESECTION WITH HIATAL HERNIA REPAIR;  Surgeon: Luretha Murphy, MD;  Location: [facility] ORS;  Service: General;;   LEFT HEART CATH AND CORONARY ANGIOGRAPHY N/A 10/30/2022   Procedure: LEFT HEART CATH AND CORONARY ANGIOGRAPHY;  Surgeon: Iran Ouch, MD;  Location: [facility] INVASIVE CV LAB;  Service: Cardiovascular;  Laterality: N/A;   MAZE N/A 03/06/2022   Procedure: MAZE;  Surgeon: Loreli Slot, MD;  Location: [facility] OR;  Service: Open Heart Surgery;  Laterality: N/A;   RADIOLOGY WITH ANESTHESIA N/A 11/12/2021   Procedure: IR WITH ANESTHESIA;  Surgeon: Julieanne Cotton, MD;  Location: [facility] OR;  Service: Radiology;  Laterality: N/A;   TEE WITHOUT CARDIOVERSION N/A 08/11/2020   Procedure: TRANSESOPHAGEAL ECHOCARDIOGRAM (TEE);  Surgeon: Regan Lemming, MD;  Location: [facility] INVASIVE CV LAB;  Service: Cardiovascular;  Laterality: N/A;   TEE  WITHOUT CARDIOVERSION N/A 11/08/2021   Procedure: TRANSESOPHAGEAL ECHOCARDIOGRAM (TEE);  Surgeon: Antonieta Iba, MD;  Location: [facility] ORS;  Service: Cardiovascular;  Laterality: N/A;   TEE WITHOUT CARDIOVERSION N/A 03/06/2022   Procedure: TRANSESOPHAGEAL ECHOCARDIOGRAM (TEE);  Surgeon: Loreli Slot, MD;  Location: [facility] OR;  Service: Open Heart Surgery;  Laterality: N/A;   TUBAL LIGATION     x 2   UPPER GI ENDOSCOPY  08/17/2015   Procedure: UPPER GI ENDOSCOPY;  Surgeon: Luretha Murphy, MD;  Location: [facility] ORS;  Service: General;;    Current Outpatient Medications  Medication Sig Dispense Refill   acetaminophen (TYLENOL) 500 MG tablet Take 500 mg by mouth every 6 (six) hours as needed for moderate pain or headache.     amiodarone (PACERONE) 200 MG tablet Take 1 tablet (200 mg total) by mouth 2 (two) times daily. 180 tablet 3   amitriptyline (ELAVIL) 25 MG tablet Take 1 tablet (25 mg total) by mouth at bedtime. 30 tablet 2   aspirin EC 81 MG tablet Take 1 tablet (81 mg total) by mouth daily. Swallow whole. 30 tablet 12   b complex vitamins capsule Take 1 capsule by mouth daily.     cholecalciferol (VITAMIN D3) 25 MCG (1000 UNIT) tablet Take 1,000 Units by mouth daily.     furosemide (LASIX) 40 MG tablet Take 40 mg by mouth daily.  gabapentin (NEURONTIN) 100 MG capsule Take 100 mg by mouth at bedtime.     Omega-3 Fatty Acids (FISH OIL) 1000 MG CAPS Take 100 mg by mouth daily.     potassium chloride (KLOR-CON) 10 MEQ tablet Take 10 mEq by mouth daily.     pravastatin (PRAVACHOL) 20 MG tablet Take 20 mg by mouth daily.     STUDY - LIBREXIA-AF - apixaban 5 mg or placebo capsule (PI-Sethi) Take 1 capsule (5 mg total) by mouth 2 (two) times daily. Take at approximately the same time of day with or without food. Please bring bottle back with you to every visit; do not discard bottle. 70 capsule 0   STUDY - LIBREXIA-AF - [id number] (Milvexian) 100 mg or placebo tablet (PI-Sethi) Take 1  tablet by mouth 2 (two) times daily. Take at approximately the same time of day with or without food. Please bring bottle back with you to every visit; do not discard bottle. 70 tablet 0   Rimegepant Sulfate (NURTEC) 75 MG TBDP Take 1 tablet (75 mg total) by mouth daily as needed. (Patient not taking: Reported on 06/18/2023) 10 tablet 5   topiramate (TOPAMAX) 25 MG tablet Take 1 tablet (25 mg total) by mouth 2 (two) times daily. (Patient not taking: Reported on 06/18/2023) 120 tablet 3   No current facility-administered medications for this visit.    Allergies:   Atorvastatin, Atarax [hydroxyzine], Codeine, and Hydrocodone   Social History:  The patient  reports that she quit smoking about 8 months ago. Her smoking use included cigarettes. She started smoking about 31 years ago. She has a 7.8 pack-year smoking history. She has been exposed to tobacco smoke. She has never used smokeless tobacco. She reports current alcohol use of about 1.0 - 2.0 standard drink of alcohol per week. She reports that she does not use drugs.   Family History:   family history includes Diabetes in her mother; Ehlers-Danlos syndrome in her daughter; Familial dysautonomia in her daughter; Heart failure in her mother; Hypertension in her mother.    Review of Systems: Review of Systems  HENT: Negative.    Respiratory: Negative.    Cardiovascular: Negative.   Gastrointestinal: Negative.   Musculoskeletal: Negative.   Neurological: Negative.   Psychiatric/Behavioral: Negative.    All other systems reviewed and are negative.  PHYSICAL EXAM: VS:  BP 100/68 (BP Location: Left Arm, Patient Position: Sitting, Cuff Size: Normal)   Pulse 69   Ht 5\' 4"  (1.626 m)   Wt 145 lb (65.8 kg)   SpO2 97%   BMI 24.89 kg/m  , BMI Body mass index is 24.89 kg/m.  Constitutional:  oriented to person, place, and time. No distress.  HENT:  Head: Grossly normal Eyes:  no discharge. No scleral icterus.  Neck: No JVD, no carotid bruits   Cardiovascular: Regular rate and rhythm, no murmurs appreciated Pulmonary/Chest: Clear to auscultation bilaterally, no wheezes or rails Abdominal: Soft.  no distension.  no tenderness.  Musculoskeletal: Normal range of motion Neurological:  normal muscle tone. Coordination normal. No atrophy Skin: Skin warm and dry Psychiatric: normal affect, pleasant  Recent Labs: 10/29/2022: B Natriuretic Peptide 439.5 01/01/2023: TSH 2.072 01/02/2023: ALT 20; BUN 21; Creatinine, Ser 0.96; Hemoglobin 11.4; Magnesium 2.5; Platelets 283; Potassium 4.1; Sodium 141    Lipid Panel Lab Results  Component Value Date   CHOL 153 10/29/2022   HDL 71 10/29/2022   LDLCALC 75 10/29/2022   TRIG 35 10/29/2022      Wt Readings  from Last 3 Encounters:  06/18/23 145 lb (65.8 kg)  05/30/23 144 lb (65.3 kg)  04/17/23 141 lb (64 kg)     ASSESSMENT AND PLAN:  Atrial fibrillation, persistent Prior ablation x 2, several cardioversions Maintained on amiodarone 200 twice daily Has follow-up with EP in 6 months  Smoking Smoking cessation recommended  Morbid obesity History of gastric bypass surgery, over 100 pound weight loss, Continue slow weight loss through dietary changes  Pulmonary hypertension/diastolic CHF Continue Lasix 40 daily, hold dose for orthostasis High fluid intake Is euvolemic on today's visit    Total encounter time more than 30 minutes  Greater than 50% was spent in counseling and coordination of care with the patient   Orders Placed This Encounter  Procedures   EKG 12-Lead     Signed, Dossie Arbour, M.D., Ph.D. 06/18/2023  [facility] [place], [place] [phone]

## 2023-06-18 ENCOUNTER — Encounter: Payer: Self-pay | Admitting: Cardiovascular Disease

## 2023-06-18 ENCOUNTER — Other Ambulatory Visit (HOSPITAL_COMMUNITY): Payer: Self-pay | Admitting: Neurology

## 2023-06-18 ENCOUNTER — Ambulatory Visit: Payer: 59 | Attending: Cardiovascular Disease | Admitting: Cardiovascular Disease

## 2023-06-18 VITALS — BP 100/68 | HR 69 | Ht 64.0 in | Wt 145.0 lb

## 2023-06-18 DIAGNOSIS — D6869 Other thrombophilia: Secondary | ICD-10-CM

## 2023-06-18 DIAGNOSIS — I5032 Chronic diastolic (congestive) heart failure: Secondary | ICD-10-CM

## 2023-06-18 DIAGNOSIS — I4819 Other persistent atrial fibrillation: Secondary | ICD-10-CM

## 2023-06-18 DIAGNOSIS — I10 Essential (primary) hypertension: Secondary | ICD-10-CM

## 2023-06-18 DIAGNOSIS — I472 Ventricular tachycardia, unspecified: Secondary | ICD-10-CM | POA: Diagnosis not present

## 2023-06-18 DIAGNOSIS — I639 Cerebral infarction, unspecified: Secondary | ICD-10-CM | POA: Diagnosis not present

## 2023-06-18 DIAGNOSIS — I422 Other hypertrophic cardiomyopathy: Secondary | ICD-10-CM | POA: Diagnosis not present

## 2023-06-18 DIAGNOSIS — E782 Mixed hyperlipidemia: Secondary | ICD-10-CM | POA: Diagnosis not present

## 2023-06-18 MED ORDER — STUDY - LIBREXIA-AF - JNJ-70033093 (MILVEXIAN) 100 MG OR PLACEBO (PI-SETHI): 1.0000 | ORAL_TABLET | Freq: Two times a day (BID) | ORAL | 0 refills | Status: DC

## 2023-06-18 MED ORDER — STUDY - LIBREXIA-AF - APIXABAN 5 MG OR PLACEBO CAPSULE (PI-SETHI): 5.0000 mg | ORAL_CAPSULE | Freq: Two times a day (BID) | ORAL | 0 refills | Status: DC

## 2023-06-18 NOTE — Patient Instructions (Addendum)
Medication Instructions:  No changes  If you need a refill on your cardiac medications before your next appointment, please call your pharmacy.   Lab work: No new labs needed  Testing/Procedures: No new testing needed  Follow-Up: At [facility], you and your health needs are our priority.  As part of our continuing mission to provide you with exceptional heart care, we have created designated Provider Care Teams.  These Care Teams include your primary Cardiologist (physician) and Advanced Practice Providers (APPs -  Physician Assistants and Nurse Practitioners) who all work together to provide you with the care you need, when you need it.  You will need a follow up appointment in 9 months  Providers on your designated Care Team:   Murray Hodgkins, NP Christell Faith, PA-C Cadence Kathlen Mody, [place]  COVID-19 Vaccine Information can be found at: [email or web address] For questions related to vaccine distribution or appointments, please email [email or web address] or call [phone].

## 2023-06-22 ENCOUNTER — Ambulatory Visit (INDEPENDENT_AMBULATORY_CARE_PROVIDER_SITE_OTHER): Payer: 59

## 2023-06-22 DIAGNOSIS — I422 Other hypertrophic cardiomyopathy: Secondary | ICD-10-CM | POA: Diagnosis not present

## 2023-06-23 LAB — CUP PACEART REMOTE DEVICE CHECK
Battery Remaining Longevity: 116 mo
Battery Voltage: 2.99 V
Brady Statistic RV Percent Paced: 99.18 %
Date Time Interrogation Session: 20241004015848
HighPow Impedance: 57 Ohm
Implantable Lead Connection Status: 753985
Implantable Lead Connection Status: 753985
Implantable Lead Implant Date: 20240329
Implantable Lead Implant Date: 20240329
Implantable Lead Location: 753859
Implantable Lead Location: 753860
Implantable Lead Model: 5076
Implantable Pulse Generator Implant Date: 20240329
Lead Channel Impedance Value: 304 Ohm
Lead Channel Impedance Value: 418 Ohm
Lead Channel Impedance Value: 456 Ohm
Lead Channel Pacing Threshold Amplitude: 0.75 V
Lead Channel Pacing Threshold Amplitude: 1.25 V
Lead Channel Pacing Threshold Pulse Width: 0.4 ms
Lead Channel Pacing Threshold Pulse Width: 0.4 ms
Lead Channel Sensing Intrinsic Amplitude: 0.8 mV
Lead Channel Sensing Intrinsic Amplitude: 3.1 mV
Lead Channel Setting Pacing Amplitude: 2 V
Lead Channel Setting Pacing Amplitude: 2 V
Lead Channel Setting Pacing Pulse Width: 0.4 ms
Lead Channel Setting Sensing Sensitivity: 0.3 mV
Zone Setting Status: 755011
Zone Setting Status: 755011
Zone Setting Status: 755011

## 2023-06-25 MED ORDER — UBRELVY 100 MG PO TABS: 100.0000 mg | ORAL_TABLET | ORAL | 11 refills | Status: DC | PRN

## 2023-06-25 NOTE — Telephone Encounter (Signed)
New prescription placed for Ubrelvy per insurance request (via MyChart message dated 06/11/2023) prior to approving Nurtec.  Triptans contraindicated due to stroke history. If this gets denied for some reason, please ensure you inform me of this. Thank you.

## 2023-06-26 ENCOUNTER — Telehealth: Payer: Self-pay

## 2023-06-26 NOTE — Telephone Encounter (Signed)
Patient has been approved by Monia Pouch for her Bernita Raisin Rx. From; 06/26/23 - 06/25/24.

## 2023-06-26 NOTE — Telephone Encounter (Signed)
PA approved for the pt  PA request has been approved. Additional information will be provided in the approval communication. (Message 1145) Authorization Expiration Date: 06/24/2024

## 2023-06-26 NOTE — Telephone Encounter (Signed)
PA submitted for the [facility] on CMM/[employer] [id number] Will await determination

## 2023-07-04 NOTE — Progress Notes (Signed)
Remote ICD transmission.   

## 2023-07-16 ENCOUNTER — Other Ambulatory Visit (HOSPITAL_COMMUNITY): Payer: Self-pay | Admitting: Neurology

## 2023-07-16 MED ORDER — STUDY - LIBREXIA-AF - JNJ-70033093 (MILVEXIAN) 100 MG OR PLACEBO (PI-SETHI): 1.0000 | ORAL_TABLET | Freq: Two times a day (BID) | ORAL | 0 refills | Status: DC

## 2023-07-16 MED ORDER — STUDY - LIBREXIA-AF - APIXABAN 5 MG OR PLACEBO CAPSULE (PI-SETHI): 5.0000 mg | ORAL_CAPSULE | Freq: Two times a day (BID) | ORAL | 0 refills | Status: DC

## 2023-07-23 MED ORDER — NURTEC 75 MG PO TBDP: 75.0000 mg | ORAL_TABLET | ORAL | 5 refills | Status: DC | PRN

## 2023-07-23 NOTE — Addendum Note (Signed)
Addended by: Judi Cong on: 07/23/2023 08:15 AM   Modules accepted: Orders

## 2023-08-20 ENCOUNTER — Telehealth: Payer: Self-pay | Admitting: Neurology

## 2023-08-20 ENCOUNTER — Other Ambulatory Visit: Payer: Self-pay | Admitting: Adult Health

## 2023-08-20 NOTE — Telephone Encounter (Signed)
PA completed again on CMM/ [employer] for Nurtec [id number] Will await determination

## 2023-08-20 NOTE — Telephone Encounter (Signed)
PA approved for the patient through [employer] 08/20/23-08/19/2024

## 2023-09-17 ENCOUNTER — Other Ambulatory Visit (HOSPITAL_COMMUNITY): Payer: Self-pay | Admitting: Pharmacist

## 2023-09-17 MED ORDER — STUDY - LIBREXIA-AF - APIXABAN 5 MG OR PLACEBO CAPSULE (PI-SETHI): 5.0000 mg | ORAL_CAPSULE | Freq: Two times a day (BID) | ORAL | 0 refills | Status: DC

## 2023-09-17 MED ORDER — STUDY - LIBREXIA-AF - JNJ-70033093 (MILVEXIAN) 100 MG OR PLACEBO (PI-SETHI): 1.0000 | ORAL_TABLET | Freq: Two times a day (BID) | ORAL | 0 refills | Status: DC

## 2023-09-21 ENCOUNTER — Ambulatory Visit (INDEPENDENT_AMBULATORY_CARE_PROVIDER_SITE_OTHER): Payer: 59

## 2023-09-21 DIAGNOSIS — I422 Other hypertrophic cardiomyopathy: Secondary | ICD-10-CM

## 2023-09-21 LAB — CUP PACEART REMOTE DEVICE CHECK
Battery Remaining Longevity: 110 mo
Battery Voltage: 2.99 V
Brady Statistic RV Percent Paced: 96.75 %
Date Time Interrogation Session: 20250102200145
HighPow Impedance: 47 Ohm
Implantable Lead Connection Status: 753985
Implantable Lead Connection Status: 753985
Implantable Lead Implant Date: 20240329
Implantable Lead Implant Date: 20240329
Implantable Lead Location: 753859
Implantable Lead Location: 753860
Implantable Lead Model: 5076
Implantable Pulse Generator Implant Date: 20240329
Lead Channel Impedance Value: 285 Ohm
Lead Channel Impedance Value: 399 Ohm
Lead Channel Impedance Value: 456 Ohm
Lead Channel Pacing Threshold Amplitude: 0.75 V
Lead Channel Pacing Threshold Amplitude: 1.375 V
Lead Channel Pacing Threshold Pulse Width: 0.4 ms
Lead Channel Pacing Threshold Pulse Width: 0.4 ms
Lead Channel Sensing Intrinsic Amplitude: 1.1 mV
Lead Channel Sensing Intrinsic Amplitude: 12 mV
Lead Channel Setting Pacing Amplitude: 2 V
Lead Channel Setting Pacing Amplitude: 2 V
Lead Channel Setting Pacing Pulse Width: 0.4 ms
Lead Channel Setting Sensing Sensitivity: 0.3 mV
Zone Setting Status: 755011
Zone Setting Status: 755011
Zone Setting Status: 755011

## 2023-10-10 ENCOUNTER — Encounter: Payer: Self-pay | Admitting: Cardiovascular Disease

## 2023-10-12 MED ORDER — FUROSEMIDE 40 MG PO TABS: 40.0000 mg | ORAL_TABLET | Freq: Every day | ORAL | 2 refills | Status: DC

## 2023-10-30 NOTE — Progress Notes (Signed)
Remote ICD transmission.

## 2023-11-08 ENCOUNTER — Other Ambulatory Visit: Payer: 59

## 2023-11-18 ENCOUNTER — Other Ambulatory Visit: Payer: Self-pay | Admitting: Adult Health

## 2023-11-19 NOTE — Telephone Encounter (Signed)
 Last seen on 05/30/23 Follow up scheduled on 12/03/23

## 2023-11-20 ENCOUNTER — Other Ambulatory Visit: Payer: Self-pay | Admitting: Family Medicine

## 2023-11-20 DIAGNOSIS — R748 Abnormal levels of other serum enzymes: Secondary | ICD-10-CM

## 2023-11-20 DIAGNOSIS — R1011 Right upper quadrant pain: Secondary | ICD-10-CM

## 2023-11-21 ENCOUNTER — Ambulatory Visit
Admission: RE | Admit: 2023-11-21 | Discharge: 2023-11-21 | Disposition: A | Discharge status: Home / Self Care | Source: Ambulatory Visit | Attending: Family Medicine | Admitting: Family Medicine

## 2023-11-21 DIAGNOSIS — R1011 Right upper quadrant pain: Secondary | ICD-10-CM | POA: Diagnosis present

## 2023-11-21 DIAGNOSIS — R748 Abnormal levels of other serum enzymes: Secondary | ICD-10-CM | POA: Diagnosis present

## 2023-11-23 ENCOUNTER — Telehealth: Payer: Self-pay | Admitting: Cardiovascular Disease

## 2023-11-23 ENCOUNTER — Ambulatory Visit: Payer: Self-pay | Admitting: General Surgery

## 2023-11-23 NOTE — Telephone Encounter (Signed)
   Patient Name: Hannah Zimmerman  DOB: [birth year] MRN: [id number]  Primary Cardiologist: Julien Nordmann, MD  Clinical pharmacists have reviewed the patient's past medical history, labs, and current medications as part of preoperative protocol coverage. The following recommendations have been made:  Per office protocol, patient can hold Eliquis for 2 days prior to procedure.     I will route this recommendation to the requesting party via Epic fax function and remove from pre-op pool.  Please call with questions.  Napoleon Form, Leodis Rains, NP 11/23/2023, 2:56 PM

## 2023-11-23 NOTE — Telephone Encounter (Signed)
   Pre-operative Risk Assessment    Patient Name: Hannah Zimmerman  DOB: September 27, 1970 MRN: [id number]   Date of last office visit: 06/18/23 Date of next office visit: n/a   Request for Surgical Clearance    Procedure:  Cholecystectomy  Date of Surgery:  Clearance 11/26/23                                Surgeon:  Dr. Maia Plan Surgeon's Group or Practice Name:  [facility] Clear[facility] Phone number:  [phone] Fax number:  [phone]   Type of Clearance Requested:  Hold Blood Thinner    Type of Anesthesia:  Not Indicated   Additional requests/questions:    Signed, Narda Amber   11/23/2023, 11:08 AM

## 2023-11-23 NOTE — H&P (View-Only) (Signed)
 PATIENT PROFILE: Hannah Zimmerman is a 53 y.o. female who presents to the Clinic for consultation at the request of Harlon Flor, [place] for evaluation of cholelithiasis.  PCP:  Wilford Corner, [place]  History of Present Illness The patient presents with possible cholecystitis for evaluation.  She experiences intermittent sharp, stabbing pains in the right upper quadrant, specifically under the ribcage, which sometimes radiates to the back. The pain is not constant and last occurred two days ago. It is exacerbated by certain movements, such as stretching, and occasionally by eating, depending on the type of food consumed. She describes a sensation of something 'catching' under the ribcage when she stretches her back. No current pain at the time of the visit, but she has a decreased appetite and experiences weight gain despite not eating much. She denies excruciating pain but describes it as uncomfortable.  An ultrasound performed on November 21, 2023, revealed gallbladder wall thickening of three millimeters, presence of sludge, and small stones. The common bile duct measured four millimeters. A complete blood count (CBC) from November 20, 2023, showed a white blood cell count of 7.8, and a comprehensive metabolic panel (CMP) indicated normal electrolytes with a bilirubin level of 0.7.  She has a history of gastric surgery performed six years ago, which affects her meal size and appetite. Despite a reduced appetite, she reports weight gain.  She has a history of atrial fibrillation and is on a blood thinner as part of a study, though she does not know the name of the medication. She also takes amiodarone for heart rate control and has a pacemaker defibrillator.  Family history includes her oldest daughter having undergone gallbladder surgery for similar symptoms, although no stones were found in her case, only sludge.   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 We have encouraged her to continue to work on weaning her cigarettes and smoking cessation. She will continue to work on this and does not want any assistance with chantix.       GENERAL REVIEW OF SYSTEMS:   General ROS: negative for - chills, fatigue, fever, weight gain or weight loss Allergy and Immunology ROS: negative for - hives  Hematological and Lymphatic ROS: negative for - bleeding problems or bruising, negative for palpable nodes Endocrine ROS: negative for - heat or cold intolerance, hair changes Respiratory ROS: negative for - cough, shortness of breath or wheezing Cardiovascular ROS: no chest pain or palpitations GI ROS: negative for nausea, vomiting, diarrhea, constipation. Positive for abdominal pain Musculoskeletal ROS: negative for - joint swelling or muscle pain Neurological ROS: negative for - confusion, syncope Dermatological ROS: negative for pruritus and rash Psychiatric: negative for anxiety, depression, difficulty sleeping and memory loss  MEDICATIONS: Current Outpatient Medications  Medication Sig Dispense Refill   acetaminophen (TYLENOL) 500 MG tablet Take 1,000 mg by mouth every 6 (six) hours as needed     AMIOdarone (PACERONE) 200 MG tablet Take 200 mg by mouth 2 (two) times daily     amitriptyline (ELAVIL) 25 MG tablet Take 1  tablet by mouth at bedtime     aspirin 81 MG EC tablet Take 81 mg by mouth once daily Take 1 tablet (81 mg total) by mouth daily. Swallow whole.     b complex vitamins capsule Take 1 capsule by mouth once daily     blood glucose  diagnostic test strip 1 each (1 strip total) 3 (three) times daily Use as instructed. 100 each 12   blood glucose meter kit as directed 1 each 0   FUROsemide (LASIX) 40 MG tablet Take 1 tablet (40 mg total) by mouth once daily 90 tablet 3   IDS Study Medication 1 each Milvexian 100 mg or placebo - Take 1 tablet by mouth 2 (two) times daily. Take at approximately the same time of day with or without food. Please bring bottle back with you to every visit; do not discard bottle.     lancing device with lancets kit Use 1 each 3 (three) times daily Use as instructed. 100 each 12   omega-3 fatty acids/fish oil (FISH OIL) 340-1,000 mg capsule Take 100 mg by mouth once daily     potassium chloride (KLOR-CON M10) 10 mEq ER tablet Take 1 tablet (10 mEq total) by mouth once daily 90 tablet 3   pravastatin (PRAVACHOL) 20 MG tablet Take 1 tablet (20 mg total) by mouth at bedtime 90 tablet 3   rimegepant (NURTEC ODT) 75 mg disintegrating tablet Take 75 mg by mouth Take 1 tablet (75 mg total) by mouth as needed (take 1 tab at migraine onset. (Do not take more than 1 tablet in 24 hour period)).     VITAMIN D3 ORAL Take 1 tablet by mouth every morning     ciprofloxacin HCl (CIPRO) 500 MG tablet Take 1 tablet (500 mg total) by mouth 2 (two) times daily for 5 days 10 tablet 0   gabapentin (NEURONTIN) 100 MG capsule Take 1 capsule (100 mg total) by mouth 2 (two) times daily 60 capsule 11   metroNIDAZOLE (FLAGYL) 500 MG tablet Take 1 tablet (500 mg total) by mouth 3 (three) times daily for 5 days 15 tablet 0   No current facility-administered medications for this visit.    ALLERGIES: Atorvastatin, Codeine, Hydrocodone, and Hydroxyzine  PAST MEDICAL HISTORY: Past Medical History:  Diagnosis Date   Atrial fibrillation (CMS/HHS-HCC)    Heart problem 2008   History of stroke 10/2021    PAST SURGICAL HISTORY: Past Surgical History:  Procedure Laterality Date   c sections     1997, 1999, 2000   cardio ablasion      11/2019   CARDIOVERSION EXTERNAL     x 3   gastric sleeve     07/2017   HYSTERECTOMY     2002   UNLISTED PROCEDURE LOWER LEG/ANKLE     surgical - "plates and screws" - later removed     FAMILY HISTORY: Family History  Problem Relation Name Age of Onset   High blood pressure (Hypertension) Mother     Atrial fibrillation (Abnormal heart rhythm sometimes requiring treatment with blood thinners) Mother     Diabetes Mother     Heart failure Mother       SOCIAL HISTORY: Social History   Socioeconomic History   Marital status: Single  Tobacco Use   Smoking status: Former    Types: Cigarettes    Passive exposure: Past   Smokeless tobacco: Never  Vaping Use   Vaping status: Never Used  Substance and Sexual Activity   Alcohol  use: Yes    Alcohol/week: 2.0 standard drinks of alcohol    Types: 2 Standard drinks or equivalent per week    Comment: Occasionally, socially   Drug use: Never   Sexual activity: Defer   Social Drivers of Health   Financial Resource Strain: High Risk (03/05/2023)   Overall Financial Resource Strain (CARDIA)    Difficulty of Paying Living Expenses: Very hard  Food Insecurity: Food Insecurity Present (03/05/2023)   Hunger Vital Sign    Worried About Running Out of Food in the Last Year: Often true    Ran Out of Food in the Last Year: Often true  Transportation Needs: No Transportation Needs (03/05/2023)   PRAPARE - Administrator, Civil Service (Medical): No    Lack of Transportation (Non-Medical): No    PHYSICAL EXAM: Vitals:   11/23/23 1008  BP: 115/72  Pulse: 70   Body mass index is 28.15 kg/m. Weight: 74.4 kg (164 lb)   GENERAL: Alert, active, oriented x3  HEENT: Pupils equal reactive to light. Extraocular movements are intact. Sclera clear. Palpebral conjunctiva normal red color.Pharynx clear.  NECK: Supple with no palpable mass and no adenopathy.  LUNGS: Sound clear with no rales rhonchi or wheezes.  HEART: Regular  rhythm S1 and S2 without murmur.  ABDOMEN: Soft and depressible, nontender with no palpable mass, no hepatomegaly.  Mild tender to palpation in the right upper quadrant  EXTREMITIES: Well-developed well-nourished symmetrical with no dependent edema.  NEUROLOGICAL: Awake alert oriented, facial expression symmetrical, moving all extremities.  REVIEW OF DATA: I have reviewed the following data today: Office Visit on 11/20/2023  Component Date Value   WBC (White Blood Cell Co* 11/20/2023 7.8    RBC (Red Blood Cell Coun* 11/20/2023 3.49 (L)    Hemoglobin 11/20/2023 10.1 (L)    Hematocrit 11/20/2023 31.7 (L)    MCV (Mean Corpuscular Vo* 11/20/2023 90.8    MCH (Mean Corpuscular He* 11/20/2023 28.9    MCHC (Mean Corpuscular H* 11/20/2023 31.9 (L)    Platelet Count 11/20/2023 255    RDW-CV (Red Cell Distrib* 11/20/2023 19.6 (H)    MPV (Mean Platelet Volum* 11/20/2023 10.7    Neutrophils 11/20/2023 5.95    Lymphocytes 11/20/2023 0.87 (L)    Monocytes 11/20/2023 0.62    Eosinophils 11/20/2023 0.18    Basophils 11/20/2023 0.15 (H)    Neutrophil % 11/20/2023 76.3 (H)    Lymphocyte % 11/20/2023 11.1    Monocyte % 11/20/2023 7.9    Eosinophil % 11/20/2023 2.3    Basophil% 11/20/2023 1.9    Immature Granulocyte % 11/20/2023 0.5    Immature Granulocyte Cou* 11/20/2023 0.04    Hemoglobin A1C 11/20/2023 6.2 (H)    Average Blood Glucose (C* 11/20/2023 131    Thyroid Stimulating Horm* 11/20/2023 3.887    Glucose 11/20/2023 90    Sodium 11/20/2023 141    Potassium 11/20/2023 4.0    Chloride 11/20/2023 104    Carbon Dioxide (CO2) 11/20/2023 31.0    Urea Nitrogen (BUN) 11/20/2023 25    Creatinine 11/20/2023 1.0    Glomerular Filtration Ra* 11/20/2023 68    Calcium 11/20/2023 9.0    AST  11/20/2023 40 (H)    ALT  11/20/2023 41 (H)    Alk Phos (alkaline Phosp* 11/20/2023 165 (H)    Albumin 11/20/2023 4.0    Bilirubin, Total 11/20/2023 0.7    Protein, Total 11/20/2023 6.8    A/G Ratio  11/20/2023 1.4   Appointment on 11/15/2023  Component  Date Value   Protein, Total 11/15/2023 6.1    Albumin 11/15/2023 3.8    Bilirubin, Total 11/15/2023 0.4    Bilirubin, Conjugated 11/15/2023 0.14    Alk Phos (alkaline Phosp* 11/15/2023 168 (H)    AST  11/15/2023 61 (H)    ALT  11/15/2023 53 (H)   Same Day Visit on 10/02/2023  Component Date Value   Protein, Total 10/02/2023 6.3    Albumin 10/02/2023 3.8    Bilirubin, Total 10/02/2023 0.3    Bilirubin, Conjugated 10/02/2023 0.09    Alk Phos (alkaline Phosp* 10/02/2023 171 (H)    AST  10/02/2023 40 (H)    ALT  10/02/2023 37   Appointment on 09/17/2023  Component Date Value   Glucose 09/17/2023 69 (L)    Sodium 09/17/2023 143    Potassium 09/17/2023 3.7    Chloride 09/17/2023 105    Carbon Dioxide (CO2) 09/17/2023 31.7    Calcium 09/17/2023 8.9    Urea Nitrogen (BUN) 09/17/2023 20    Creatinine 09/17/2023 1.1    Glomerular Filtration Ra* 09/17/2023 60 (L)    BUN/Crea Ratio 09/17/2023 18.2    Anion Gap w/K 09/17/2023 10.0   Office Visit on 08/27/2023  Component Date Value   Glucose 08/27/2023 83    Sodium 08/27/2023 141    Potassium 08/27/2023 4.0    Chloride 08/27/2023 102    Carbon Dioxide (CO2) 08/27/2023 29.3    Calcium 08/27/2023 9.2    Urea Nitrogen (BUN) 08/27/2023 29 (H)    Creatinine 08/27/2023 1.5 (H)    Glomerular Filtration Ra* 08/27/2023 42 (L)    BUN/Crea Ratio 08/27/2023 19.3    Anion Gap w/K 08/27/2023 13.7      @RESULSEC @  Assessment & Plan Cholecystitis   She experiences intermittent sharp, stabbing pain in the right upper quadrant, indicating gallbladder involvement. An ultrasound on November 21, 2023, reveals gallbladder wall thickening of 3 mm, sludge, and small stones, consistent with cholecystitis. The common bile duct measures 4 mm, with no significant bilirubin elevation. Pain is exacerbated by certain foods and is associated with a sensation of catching under the ribcage. Significant  inflammation warrants urgent surgical intervention. A laparoscopic cholecystectomy is recommended with a 99% likelihood of minimally invasive completion. Recovery is expected within one to two weeks, with most returning to full activity in this timeframe. Schedule laparoscopic cholecystectomy for Monday. Contact the neurologist regarding blood thinner management for surgery. Prescribe antibiotics to reduce inflammation over the weekend.  Gastric Sleeve Surgery   Her gastric sleeve surgery six years ago may influence current gastrointestinal symptoms and nutritional intake, relevant to current gallbladder issues and potential surgical intervention.  Atrial Fibrillation   Atrial fibrillation is managed with a blood thinner as part of a study for stroke and AFib patients. The specific blood thinner is unknown due to study protocol. She also has a pacemaker-defibrillator and is on amiodarone for heart rate control. Coordination with the cardiologist is necessary for surgical clearance and anticoagulation management. The blood thinner should be held for three days prior to surgery as per study protocol. Coordinate with the cardiologist for surgical clearance and blood thinner management. Hold the blood thinner for three days prior to surgery as per study protocol.  Patient was oriented about the diagnosis of cholelithiasis with cholecystitis. Also oriented about what is the gallbladder, its anatomy and function and the implications of having stones. The patient was oriented about the treatment alternatives (observation vs cholecystectomy). Patient was oriented that a low percentage of patient  will continue to have similar pain symptoms even after the gallbladder is removed. Surgical technique (open vs laparoscopic) was discussed. It was also discussed the goals of the surgery (decrease the pain episodes and avoid the risk of cholecystitis) and the risk of surgery including: bleeding, infection, common bile duct  injury, stone retention, injury to other organs such as bowel, liver, stomach, other complications such as hernia, bowel obstruction among others. Also discussed with patient about anesthesia and its complications such as: reaction to medications, pneumonia, heart complications, death, among others.   Patient understand being higher risk of surgery due to her cardiac comorbidities including pain and blood thinners  Acute cholecystitis with chronic cholecystitis [K81.2]  Patient and her daughter verbalized understanding, all questions were answered, and were agreeable with the plan outlined above.     Carolan Shiver, MD

## 2023-11-23 NOTE — Telephone Encounter (Signed)
 Patient with diagnosis of afib on Eliquis for anticoagulation.    Procedure:  Cholecystectomy  Date of procedure: 11/26/23   CHA2DS2-VASc Score = 4   This indicates a 4.8% annual risk of stroke. The patient's score is based upon: CHF History: 1 HTN History: 0 Diabetes History: 0 Stroke History: 2 Vascular Disease History: 0 Age Score: 0 Gender Score: 1      CrCl 57 ml/min Platelet count 255  Stroke in 2023 Atrial fibrillation, ablation x 2 as well as surgical maze with left atrial appendage clipping   Per office protocol, patient can hold Eliquis for 2 days prior to procedure.    **This guidance is not considered finalized until pre-operative APP has relayed final recommendations.**

## 2023-11-23 NOTE — H&P (Signed)
 PATIENT PROFILE: Hannah Zimmerman is a 53 y.o. female who presents to the Clinic for consultation at the request of Harlon Flor, [place] for evaluation of cholelithiasis.  PCP:  Wilford Corner, [place]  History of Present Illness The patient presents with possible cholecystitis for evaluation.  She experiences intermittent sharp, stabbing pains in the right upper quadrant, specifically under the ribcage, which sometimes radiates to the back. The pain is not constant and last occurred two days ago. It is exacerbated by certain movements, such as stretching, and occasionally by eating, depending on the type of food consumed. She describes a sensation of something 'catching' under the ribcage when she stretches her back. No current pain at the time of the visit, but she has a decreased appetite and experiences weight gain despite not eating much. She denies excruciating pain but describes it as uncomfortable.  An ultrasound performed on November 21, 2023, revealed gallbladder wall thickening of three millimeters, presence of sludge, and small stones. The common bile duct measured four millimeters. A complete blood count (CBC) from November 20, 2023, showed a white blood cell count of 7.8, and a comprehensive metabolic panel (CMP) indicated normal electrolytes with a bilirubin level of 0.7.  She has a history of gastric surgery performed six years ago, which affects her meal size and appetite. Despite a reduced appetite, she reports weight gain.  She has a history of atrial fibrillation and is on a blood thinner as part of a study, though she does not know the name of the medication. She also takes amiodarone for heart rate control and has a pacemaker defibrillator.  Family history includes her oldest daughter having undergone gallbladder surgery for similar symptoms, although no stones were found in her case, only sludge.   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 We have encouraged her to continue to work on weaning her cigarettes and smoking cessation. She will continue to work on this and does not want any assistance with chantix.       GENERAL REVIEW OF SYSTEMS:   General ROS: negative for - chills, fatigue, fever, weight gain or weight loss Allergy and Immunology ROS: negative for - hives  Hematological and Lymphatic ROS: negative for - bleeding problems or bruising, negative for palpable nodes Endocrine ROS: negative for - heat or cold intolerance, hair changes Respiratory ROS: negative for - cough, shortness of breath or wheezing Cardiovascular ROS: no chest pain or palpitations GI ROS: negative for nausea, vomiting, diarrhea, constipation. Positive for abdominal pain Musculoskeletal ROS: negative for - joint swelling or muscle pain Neurological ROS: negative for - confusion, syncope Dermatological ROS: negative for pruritus and rash Psychiatric: negative for anxiety, depression, difficulty sleeping and memory loss  MEDICATIONS: Current Outpatient Medications  Medication Sig Dispense Refill   acetaminophen (TYLENOL) 500 MG tablet Take 1,000 mg by mouth every 6 (six) hours as needed     AMIOdarone (PACERONE) 200 MG tablet Take 200 mg by mouth 2 (two) times daily     amitriptyline (ELAVIL) 25 MG tablet Take 1  tablet by mouth at bedtime     aspirin 81 MG EC tablet Take 81 mg by mouth once daily Take 1 tablet (81 mg total) by mouth daily. Swallow whole.     b complex vitamins capsule Take 1 capsule by mouth once daily     blood glucose  diagnostic test strip 1 each (1 strip total) 3 (three) times daily Use as instructed. 100 each 12   blood glucose meter kit as directed 1 each 0   FUROsemide (LASIX) 40 MG tablet Take 1 tablet (40 mg total) by mouth once daily 90 tablet 3   IDS Study Medication 1 each Milvexian 100 mg or placebo - Take 1 tablet by mouth 2 (two) times daily. Take at approximately the same time of day with or without food. Please bring bottle back with you to every visit; do not discard bottle.     lancing device with lancets kit Use 1 each 3 (three) times daily Use as instructed. 100 each 12   omega-3 fatty acids/fish oil (FISH OIL) 340-1,000 mg capsule Take 100 mg by mouth once daily     potassium chloride (KLOR-CON M10) 10 mEq ER tablet Take 1 tablet (10 mEq total) by mouth once daily 90 tablet 3   pravastatin (PRAVACHOL) 20 MG tablet Take 1 tablet (20 mg total) by mouth at bedtime 90 tablet 3   rimegepant (NURTEC ODT) 75 mg disintegrating tablet Take 75 mg by mouth Take 1 tablet (75 mg total) by mouth as needed (take 1 tab at migraine onset. (Do not take more than 1 tablet in 24 hour period)).     VITAMIN D3 ORAL Take 1 tablet by mouth every morning     ciprofloxacin HCl (CIPRO) 500 MG tablet Take 1 tablet (500 mg total) by mouth 2 (two) times daily for 5 days 10 tablet 0   gabapentin (NEURONTIN) 100 MG capsule Take 1 capsule (100 mg total) by mouth 2 (two) times daily 60 capsule 11   metroNIDAZOLE (FLAGYL) 500 MG tablet Take 1 tablet (500 mg total) by mouth 3 (three) times daily for 5 days 15 tablet 0   No current facility-administered medications for this visit.    ALLERGIES: Atorvastatin, Codeine, Hydrocodone, and Hydroxyzine  PAST MEDICAL HISTORY: Past Medical History:  Diagnosis Date   Atrial fibrillation (CMS/HHS-HCC)    Heart problem 2008   History of stroke 10/2021    PAST SURGICAL HISTORY: Past Surgical History:  Procedure Laterality Date   c sections     1997, 1999, 2000   cardio ablasion      11/2019   CARDIOVERSION EXTERNAL     x 3   gastric sleeve     07/2017   HYSTERECTOMY     2002   UNLISTED PROCEDURE LOWER LEG/ANKLE     surgical - "plates and screws" - later removed     FAMILY HISTORY: Family History  Problem Relation Name Age of Onset   High blood pressure (Hypertension) Mother     Atrial fibrillation (Abnormal heart rhythm sometimes requiring treatment with blood thinners) Mother     Diabetes Mother     Heart failure Mother       SOCIAL HISTORY: Social History   Socioeconomic History   Marital status: Single  Tobacco Use   Smoking status: Former    Types: Cigarettes    Passive exposure: Past   Smokeless tobacco: Never  Vaping Use   Vaping status: Never Used  Substance and Sexual Activity   Alcohol  use: Yes    Alcohol/week: 2.0 standard drinks of alcohol    Types: 2 Standard drinks or equivalent per week    Comment: Occasionally, socially   Drug use: Never   Sexual activity: Defer   Social Drivers of Health   Financial Resource Strain: High Risk (03/05/2023)   Overall Financial Resource Strain (CARDIA)    Difficulty of Paying Living Expenses: Very hard  Food Insecurity: Food Insecurity Present (03/05/2023)   Hunger Vital Sign    Worried About Running Out of Food in the Last Year: Often true    Ran Out of Food in the Last Year: Often true  Transportation Needs: No Transportation Needs (03/05/2023)   PRAPARE - Administrator, Civil Service (Medical): No    Lack of Transportation (Non-Medical): No    PHYSICAL EXAM: Vitals:   11/23/23 1008  BP: 115/72  Pulse: 70   Body mass index is 28.15 kg/m. Weight: 74.4 kg (164 lb)   GENERAL: Alert, active, oriented x3  HEENT: Pupils equal reactive to light. Extraocular movements are intact. Sclera clear. Palpebral conjunctiva normal red color.Pharynx clear.  NECK: Supple with no palpable mass and no adenopathy.  LUNGS: Sound clear with no rales rhonchi or wheezes.  HEART: Regular  rhythm S1 and S2 without murmur.  ABDOMEN: Soft and depressible, nontender with no palpable mass, no hepatomegaly.  Mild tender to palpation in the right upper quadrant  EXTREMITIES: Well-developed well-nourished symmetrical with no dependent edema.  NEUROLOGICAL: Awake alert oriented, facial expression symmetrical, moving all extremities.  REVIEW OF DATA: I have reviewed the following data today: Office Visit on 11/20/2023  Component Date Value   WBC (White Blood Cell Co* 11/20/2023 7.8    RBC (Red Blood Cell Coun* 11/20/2023 3.49 (L)    Hemoglobin 11/20/2023 10.1 (L)    Hematocrit 11/20/2023 31.7 (L)    MCV (Mean Corpuscular Vo* 11/20/2023 90.8    MCH (Mean Corpuscular He* 11/20/2023 28.9    MCHC (Mean Corpuscular H* 11/20/2023 31.9 (L)    Platelet Count 11/20/2023 255    RDW-CV (Red Cell Distrib* 11/20/2023 19.6 (H)    MPV (Mean Platelet Volum* 11/20/2023 10.7    Neutrophils 11/20/2023 5.95    Lymphocytes 11/20/2023 0.87 (L)    Monocytes 11/20/2023 0.62    Eosinophils 11/20/2023 0.18    Basophils 11/20/2023 0.15 (H)    Neutrophil % 11/20/2023 76.3 (H)    Lymphocyte % 11/20/2023 11.1    Monocyte % 11/20/2023 7.9    Eosinophil % 11/20/2023 2.3    Basophil% 11/20/2023 1.9    Immature Granulocyte % 11/20/2023 0.5    Immature Granulocyte Cou* 11/20/2023 0.04    Hemoglobin A1C 11/20/2023 6.2 (H)    Average Blood Glucose (C* 11/20/2023 131    Thyroid Stimulating Horm* 11/20/2023 3.887    Glucose 11/20/2023 90    Sodium 11/20/2023 141    Potassium 11/20/2023 4.0    Chloride 11/20/2023 104    Carbon Dioxide (CO2) 11/20/2023 31.0    Urea Nitrogen (BUN) 11/20/2023 25    Creatinine 11/20/2023 1.0    Glomerular Filtration Ra* 11/20/2023 68    Calcium 11/20/2023 9.0    AST  11/20/2023 40 (H)    ALT  11/20/2023 41 (H)    Alk Phos (alkaline Phosp* 11/20/2023 165 (H)    Albumin 11/20/2023 4.0    Bilirubin, Total 11/20/2023 0.7    Protein, Total 11/20/2023 6.8    A/G Ratio  11/20/2023 1.4   Appointment on 11/15/2023  Component  Date Value   Protein, Total 11/15/2023 6.1    Albumin 11/15/2023 3.8    Bilirubin, Total 11/15/2023 0.4    Bilirubin, Conjugated 11/15/2023 0.14    Alk Phos (alkaline Phosp* 11/15/2023 168 (H)    AST  11/15/2023 61 (H)    ALT  11/15/2023 53 (H)   Same Day Visit on 10/02/2023  Component Date Value   Protein, Total 10/02/2023 6.3    Albumin 10/02/2023 3.8    Bilirubin, Total 10/02/2023 0.3    Bilirubin, Conjugated 10/02/2023 0.09    Alk Phos (alkaline Phosp* 10/02/2023 171 (H)    AST  10/02/2023 40 (H)    ALT  10/02/2023 37   Appointment on 09/17/2023  Component Date Value   Glucose 09/17/2023 69 (L)    Sodium 09/17/2023 143    Potassium 09/17/2023 3.7    Chloride 09/17/2023 105    Carbon Dioxide (CO2) 09/17/2023 31.7    Calcium 09/17/2023 8.9    Urea Nitrogen (BUN) 09/17/2023 20    Creatinine 09/17/2023 1.1    Glomerular Filtration Ra* 09/17/2023 60 (L)    BUN/Crea Ratio 09/17/2023 18.2    Anion Gap w/K 09/17/2023 10.0   Office Visit on 08/27/2023  Component Date Value   Glucose 08/27/2023 83    Sodium 08/27/2023 141    Potassium 08/27/2023 4.0    Chloride 08/27/2023 102    Carbon Dioxide (CO2) 08/27/2023 29.3    Calcium 08/27/2023 9.2    Urea Nitrogen (BUN) 08/27/2023 29 (H)    Creatinine 08/27/2023 1.5 (H)    Glomerular Filtration Ra* 08/27/2023 42 (L)    BUN/Crea Ratio 08/27/2023 19.3    Anion Gap w/K 08/27/2023 13.7      @RESULSEC @  Assessment & Plan Cholecystitis   She experiences intermittent sharp, stabbing pain in the right upper quadrant, indicating gallbladder involvement. An ultrasound on November 21, 2023, reveals gallbladder wall thickening of 3 mm, sludge, and small stones, consistent with cholecystitis. The common bile duct measures 4 mm, with no significant bilirubin elevation. Pain is exacerbated by certain foods and is associated with a sensation of catching under the ribcage. Significant  inflammation warrants urgent surgical intervention. A laparoscopic cholecystectomy is recommended with a 99% likelihood of minimally invasive completion. Recovery is expected within one to two weeks, with most returning to full activity in this timeframe. Schedule laparoscopic cholecystectomy for Monday. Contact the neurologist regarding blood thinner management for surgery. Prescribe antibiotics to reduce inflammation over the weekend.  Gastric Sleeve Surgery   Her gastric sleeve surgery six years ago may influence current gastrointestinal symptoms and nutritional intake, relevant to current gallbladder issues and potential surgical intervention.  Atrial Fibrillation   Atrial fibrillation is managed with a blood thinner as part of a study for stroke and AFib patients. The specific blood thinner is unknown due to study protocol. She also has a pacemaker-defibrillator and is on amiodarone for heart rate control. Coordination with the cardiologist is necessary for surgical clearance and anticoagulation management. The blood thinner should be held for three days prior to surgery as per study protocol. Coordinate with the cardiologist for surgical clearance and blood thinner management. Hold the blood thinner for three days prior to surgery as per study protocol.  Patient was oriented about the diagnosis of cholelithiasis with cholecystitis. Also oriented about what is the gallbladder, its anatomy and function and the implications of having stones. The patient was oriented about the treatment alternatives (observation vs cholecystectomy). Patient was oriented that a low percentage of patient  will continue to have similar pain symptoms even after the gallbladder is removed. Surgical technique (open vs laparoscopic) was discussed. It was also discussed the goals of the surgery (decrease the pain episodes and avoid the risk of cholecystitis) and the risk of surgery including: bleeding, infection, common bile duct  injury, stone retention, injury to other organs such as bowel, liver, stomach, other complications such as hernia, bowel obstruction among others. Also discussed with patient about anesthesia and its complications such as: reaction to medications, pneumonia, heart complications, death, among others.   Patient understand being higher risk of surgery due to her cardiac comorbidities including pain and blood thinners  Acute cholecystitis with chronic cholecystitis [K81.2]  Patient and her daughter verbalized understanding, all questions were answered, and were agreeable with the plan outlined above.     Carolan Shiver, MD

## 2023-11-26 ENCOUNTER — Encounter: Payer: Self-pay | Admitting: General Surgery

## 2023-11-26 ENCOUNTER — Other Ambulatory Visit: Payer: Self-pay

## 2023-11-26 ENCOUNTER — Encounter
Admission: RE | Disposition: A | Discharge status: Home / Self Care | Payer: Self-pay | Source: Home / Self Care | Attending: General Surgery

## 2023-11-26 ENCOUNTER — Ambulatory Visit: Admitting: Anesthesiology

## 2023-11-26 ENCOUNTER — Ambulatory Visit
Admission: RE | Admit: 2023-11-26 | Discharge: 2023-11-26 | Disposition: A | Discharge status: Home / Self Care | Attending: General Surgery | Admitting: General Surgery

## 2023-11-26 DIAGNOSIS — Z79899 Other long term (current) drug therapy: Secondary | ICD-10-CM | POA: Insufficient documentation

## 2023-11-26 DIAGNOSIS — I5032 Chronic diastolic (congestive) heart failure: Secondary | ICD-10-CM | POA: Insufficient documentation

## 2023-11-26 DIAGNOSIS — E785 Hyperlipidemia, unspecified: Secondary | ICD-10-CM | POA: Insufficient documentation

## 2023-11-26 DIAGNOSIS — I4891 Unspecified atrial fibrillation: Secondary | ICD-10-CM | POA: Insufficient documentation

## 2023-11-26 DIAGNOSIS — K8012 Calculus of gallbladder with acute and chronic cholecystitis without obstruction: Secondary | ICD-10-CM | POA: Diagnosis present

## 2023-11-26 DIAGNOSIS — K828 Other specified diseases of gallbladder: Secondary | ICD-10-CM | POA: Diagnosis not present

## 2023-11-26 DIAGNOSIS — Z8673 Personal history of transient ischemic attack (TIA), and cerebral infarction without residual deficits: Secondary | ICD-10-CM | POA: Diagnosis not present

## 2023-11-26 DIAGNOSIS — Z7982 Long term (current) use of aspirin: Secondary | ICD-10-CM | POA: Diagnosis not present

## 2023-11-26 DIAGNOSIS — D509 Iron deficiency anemia, unspecified: Secondary | ICD-10-CM

## 2023-11-26 DIAGNOSIS — Z9884 Bariatric surgery status: Secondary | ICD-10-CM | POA: Diagnosis not present

## 2023-11-26 DIAGNOSIS — Z87891 Personal history of nicotine dependence: Secondary | ICD-10-CM | POA: Insufficient documentation

## 2023-11-26 DIAGNOSIS — F32A Depression, unspecified: Secondary | ICD-10-CM | POA: Diagnosis not present

## 2023-11-26 DIAGNOSIS — Z833 Family history of diabetes mellitus: Secondary | ICD-10-CM | POA: Diagnosis not present

## 2023-11-26 SURGERY — CHOLECYSTECTOMY, ROBOT-ASSISTED, LAPAROSCOPIC
Anesthesia: General

## 2023-11-26 MED ORDER — MIDAZOLAM HCL 2 MG/2ML IJ SOLN
INTRAMUSCULAR | Status: DC | PRN
  Administered 2023-11-26: 2 mg via INTRAVENOUS

## 2023-11-26 MED ORDER — ONDANSETRON HCL 4 MG/2ML IJ SOLN
INTRAMUSCULAR | Status: AC
  Filled 2023-11-26: qty 2

## 2023-11-26 MED ORDER — SUGAMMADEX SODIUM 500 MG/5ML IV SOLN
INTRAVENOUS | Status: DC | PRN
  Administered 2023-11-26: 200 mg via INTRAVENOUS

## 2023-11-26 MED ORDER — ORAL CARE MOUTH RINSE: 15.0000 mL | Freq: Once | OROMUCOSAL | Status: AC

## 2023-11-26 MED ORDER — KETOROLAC TROMETHAMINE 30 MG/ML IJ SOLN
INTRAMUSCULAR | Status: DC | PRN
  Administered 2023-11-26: 30 mg via INTRAVENOUS

## 2023-11-26 MED ORDER — ROCURONIUM BROMIDE 10 MG/ML (PF) SYRINGE
PREFILLED_SYRINGE | INTRAVENOUS | Status: AC
  Filled 2023-11-26: qty 10

## 2023-11-26 MED ORDER — CEFAZOLIN SODIUM-DEXTROSE 2-4 GM/100ML-% IV SOLN
2.0000 g | INTRAVENOUS | Status: AC
Start: 2023-11-26 — End: 2023-11-26
  Administered 2023-11-26: 2 g via INTRAVENOUS

## 2023-11-26 MED ORDER — PROPOFOL 10 MG/ML IV BOLUS
INTRAVENOUS | Status: DC | PRN
  Administered 2023-11-26: 50 ug/kg/min via INTRAVENOUS
  Administered 2023-11-26: 150 mg via INTRAVENOUS

## 2023-11-26 MED ORDER — CEFAZOLIN SODIUM 1 G IJ SOLR
INTRAMUSCULAR | Status: AC
  Filled 2023-11-26: qty 20

## 2023-11-26 MED ORDER — PHENYLEPHRINE 80 MCG/ML (10ML) SYRINGE FOR IV PUSH (FOR BLOOD PRESSURE SUPPORT)
PREFILLED_SYRINGE | INTRAVENOUS | Status: AC
  Filled 2023-11-26: qty 10

## 2023-11-26 MED ORDER — SODIUM CHLORIDE 0.9% FLUSH: 3.0000 mL | INTRAVENOUS | Status: DC | PRN

## 2023-11-26 MED ORDER — BUPIVACAINE-EPINEPHRINE 0.25% -1:200000 IJ SOLN
INTRAMUSCULAR | Status: DC | PRN
  Administered 2023-11-26: 20 mL
  Administered 2023-11-26: 10 mL

## 2023-11-26 MED ORDER — CHLORHEXIDINE GLUCONATE 0.12 % MT SOLN
15.0000 mL | Freq: Once | OROMUCOSAL | Status: AC
  Administered 2023-11-26: 15 mL via OROMUCOSAL

## 2023-11-26 MED ORDER — FENTANYL CITRATE (PF) 100 MCG/2ML IJ SOLN
INTRAMUSCULAR | Status: AC
  Filled 2023-11-26: qty 2

## 2023-11-26 MED ORDER — OXYCODONE HCL 5 MG/5ML PO SOLN: 5.0000 mg | Freq: Once | ORAL | Status: DC | PRN

## 2023-11-26 MED ORDER — INDOCYANINE GREEN 25 MG IV SOLR
INTRAVENOUS | Status: AC
  Filled 2023-11-26: qty 10

## 2023-11-26 MED ORDER — TRAMADOL HCL 50 MG PO TABS: 50.0000 mg | ORAL_TABLET | Freq: Four times a day (QID) | ORAL | 0 refills | Status: AC | PRN

## 2023-11-26 MED ORDER — LACTATED RINGERS IV SOLN: INTRAVENOUS | Status: DC | PRN

## 2023-11-26 MED ORDER — LIDOCAINE HCL (CARDIAC) PF 100 MG/5ML IV SOSY
PREFILLED_SYRINGE | INTRAVENOUS | Status: DC | PRN
  Administered 2023-11-26: 100 mg via INTRAVENOUS

## 2023-11-26 MED ORDER — PROPOFOL 1000 MG/100ML IV EMUL
INTRAVENOUS | Status: AC
Start: 2023-11-26 — End: ?
  Filled 2023-11-26: qty 100

## 2023-11-26 MED ORDER — FENTANYL CITRATE (PF) 100 MCG/2ML IJ SOLN
INTRAMUSCULAR | Status: DC | PRN
  Administered 2023-11-26: 50 ug via INTRAVENOUS

## 2023-11-26 MED ORDER — CEFAZOLIN SODIUM-DEXTROSE 2-4 GM/100ML-% IV SOLN
INTRAVENOUS | Status: AC
  Filled 2023-11-26: qty 100

## 2023-11-26 MED ORDER — ACETAMINOPHEN 10 MG/ML IV SOLN
INTRAVENOUS | Status: DC | PRN
  Administered 2023-11-26: 1000 mg via INTRAVENOUS

## 2023-11-26 MED ORDER — DROPERIDOL 2.5 MG/ML IJ SOLN
INTRAMUSCULAR | Status: AC
  Filled 2023-11-26: qty 2

## 2023-11-26 MED ORDER — LIDOCAINE HCL (PF) 2 % IJ SOLN
INTRAMUSCULAR | Status: AC
  Filled 2023-11-26: qty 5

## 2023-11-26 MED ORDER — SODIUM CHLORIDE 0.9% FLUSH
3.0000 mL | Freq: Two times a day (BID) | INTRAVENOUS | Status: DC
Start: 2023-11-26 — End: 2023-11-26

## 2023-11-26 MED ORDER — SUGAMMADEX SODIUM 200 MG/2ML IV SOLN
INTRAVENOUS | Status: AC
  Filled 2023-11-26: qty 2

## 2023-11-26 MED ORDER — HYDROMORPHONE HCL 1 MG/ML IJ SOLN: 0.2500 mg | INTRAMUSCULAR | Status: DC | PRN

## 2023-11-26 MED ORDER — INDOCYANINE GREEN 25 MG IV SOLR
1.2500 mg | Freq: Once | INTRAVENOUS | Status: AC
  Administered 2023-11-26: 1.25 mg via INTRAVENOUS

## 2023-11-26 MED ORDER — BUPIVACAINE-EPINEPHRINE (PF) 0.25% -1:200000 IJ SOLN
INTRAMUSCULAR | Status: AC
  Filled 2023-11-26: qty 30

## 2023-11-26 MED ORDER — OXYCODONE HCL 5 MG PO TABS: 5.0000 mg | ORAL_TABLET | Freq: Once | ORAL | Status: DC | PRN

## 2023-11-26 MED ORDER — DEXAMETHASONE SODIUM PHOSPHATE 10 MG/ML IJ SOLN
INTRAMUSCULAR | Status: AC
  Filled 2023-11-26: qty 1

## 2023-11-26 MED ORDER — KETOROLAC TROMETHAMINE 30 MG/ML IJ SOLN
INTRAMUSCULAR | Status: AC
  Filled 2023-11-26: qty 1

## 2023-11-26 MED ORDER — MIDAZOLAM HCL 2 MG/2ML IJ SOLN
INTRAMUSCULAR | Status: AC
Start: 2023-11-26 — End: ?
  Filled 2023-11-26: qty 2

## 2023-11-26 MED ORDER — ONDANSETRON HCL 4 MG/2ML IJ SOLN
INTRAMUSCULAR | Status: DC | PRN
  Administered 2023-11-26: 4 mg via INTRAVENOUS

## 2023-11-26 MED ORDER — ROCURONIUM BROMIDE 10 MG/ML (PF) SYRINGE
PREFILLED_SYRINGE | INTRAVENOUS | Status: DC | PRN
  Administered 2023-11-26: 50 mg via INTRAVENOUS

## 2023-11-26 MED ORDER — DROPERIDOL 2.5 MG/ML IJ SOLN
0.6250 mg | Freq: Once | INTRAMUSCULAR | Status: AC
  Administered 2023-11-26: 0.625 mg via INTRAVENOUS
  Filled 2023-11-26: qty 2

## 2023-11-26 MED ORDER — CHLORHEXIDINE GLUCONATE 0.12 % MT SOLN
OROMUCOSAL | Status: AC
  Filled 2023-11-26: qty 15

## 2023-11-26 MED ORDER — DEXAMETHASONE SODIUM PHOSPHATE 10 MG/ML IJ SOLN
INTRAMUSCULAR | Status: DC | PRN
  Administered 2023-11-26: 10 mg via INTRAVENOUS

## 2023-11-26 SURGICAL SUPPLY — 47 items
BAG PRESSURE INF REUSE 1000 (BAG) IMPLANT
CANNULA REDUCER 12-8 DVNC XI (CANNULA) ×1 IMPLANT
CATH REDDICK CHOLANGI 4FR 50CM (CATHETERS) IMPLANT
CAUTERY HOOK MNPLR 1.6 DVNC XI (INSTRUMENTS) ×1 IMPLANT
CLIP LIGATING HEM O LOK PURPLE (MISCELLANEOUS) IMPLANT
CLIP LIGATING HEMO O LOK GREEN (MISCELLANEOUS) ×1 IMPLANT
DERMABOND ADVANCED .7 DNX12 (GAUZE/BANDAGES/DRESSINGS) ×1 IMPLANT
DRAPE ARM DVNC X/XI (DISPOSABLE) ×4 IMPLANT
DRAPE C-ARM XRAY 36X54 (DRAPES) IMPLANT
DRAPE COLUMN DVNC XI (DISPOSABLE) ×1 IMPLANT
ELECT REM PT RETURN 9FT ADLT (ELECTROSURGICAL) ×1 IMPLANT
ELECTRODE REM PT RTRN 9FT ADLT (ELECTROSURGICAL) ×1 IMPLANT
FORCEPS BPLR 8 MD DVNC XI (FORCEP) ×1 IMPLANT
FORCEPS BPLR FENES DVNC XI (FORCEP) ×1 IMPLANT
FORCEPS PROGRASP DVNC XI (FORCEP) ×1 IMPLANT
GLOVE BIO SURGEON STRL SZ 6.5 (GLOVE) ×2 IMPLANT
GLOVE BIOGEL PI IND STRL 6.5 (GLOVE) ×2 IMPLANT
GOWN STRL REUS W/ TWL LRG LVL3 (GOWN DISPOSABLE) ×3 IMPLANT
GRASPER SUT TROCAR 14GX15 (MISCELLANEOUS) ×1 IMPLANT
IRRIGATOR SUCT 8 DISP DVNC XI (IRRIGATION / IRRIGATOR) IMPLANT
IV CATH ANGIO 12GX3 LT BLUE (NEEDLE) IMPLANT
IV NS 1000ML BAXH (IV SOLUTION) IMPLANT
KIT PINK PAD W/HEAD ARE REST (MISCELLANEOUS) ×1 IMPLANT
KIT PINK PAD W/HEAD ARM REST (MISCELLANEOUS) ×1 IMPLANT
LABEL OR SOLS (LABEL) ×1 IMPLANT
MANIFOLD NEPTUNE II (INSTRUMENTS) ×1 IMPLANT
NDL HYPO 22X1.5 SAFETY MO (MISCELLANEOUS) ×1 IMPLANT
NDL INSUFFLATION 14GA 120MM (NEEDLE) ×1 IMPLANT
NEEDLE HYPO 22X1.5 SAFETY MO (MISCELLANEOUS) ×1 IMPLANT
NEEDLE INSUFFLATION 14GA 120MM (NEEDLE) ×1 IMPLANT
NS IRRIG 500ML POUR BTL (IV SOLUTION) ×1 IMPLANT
OBTURATOR OPTICAL STND 8 DVNC (TROCAR) ×1 IMPLANT
OBTURATOR OPTICALSTD 8 DVNC (TROCAR) ×1 IMPLANT
PACK LAP CHOLECYSTECTOMY (MISCELLANEOUS) ×1 IMPLANT
SEAL UNIV 5-12 XI (MISCELLANEOUS) ×4 IMPLANT
SET TUBE SMOKE EVAC HIGH FLOW (TUBING) ×1 IMPLANT
SOL ELECTROSURG ANTI STICK (MISCELLANEOUS) ×1 IMPLANT
SOLUTION ELECTROSURG ANTI STCK (MISCELLANEOUS) ×1 IMPLANT
SPIKE FLUID TRANSFER (MISCELLANEOUS) ×2 IMPLANT
SPONGE T-LAP 4X18 ~~LOC~~+RFID (SPONGE) IMPLANT
SUT MNCRL 4-0 27 PS-2 XMFL (SUTURE) ×1 IMPLANT
SUT VICRYL 0 UR6 27IN ABS (SUTURE) ×1 IMPLANT
SUTURE MNCRL 4-0 27XMF (SUTURE) ×1 IMPLANT
SYS BAG RETRIEVAL 10MM (BASKET) ×1 IMPLANT
SYSTEM BAG RETRIEVAL 10MM (BASKET) ×1 IMPLANT
TRAP FLUID SMOKE EVACUATOR (MISCELLANEOUS) ×1 IMPLANT
WATER STERILE IRR 500ML POUR (IV SOLUTION) ×1 IMPLANT

## 2023-11-26 NOTE — Discharge Instructions (Addendum)

## 2023-11-26 NOTE — Anesthesia Preprocedure Evaluation (Deleted)
 Anesthesia Evaluation  Patient identified by MRN, date of birth, ID band Patient awake    Reviewed: Allergy & Precautions, H&P , NPO status , Patient's Chart, lab work & pertinent test results  Airway Mallampati: II  TM Distance: >3 FB Neck ROM: full    Dental no notable dental hx.    Pulmonary neg pulmonary ROS, former smoker   Pulmonary exam normal        Cardiovascular negative cardio ROS Normal cardiovascular exam     Neuro/Psych negative neurological ROS  negative psych ROS   GI/Hepatic negative GI ROS, Neg liver ROS,,,  Endo/Other  negative endocrine ROS    Renal/GU      Musculoskeletal   Abdominal   Peds  Hematology negative hematology ROS (+)   Anesthesia Other Findings Past Medical History: No date: (HFpEF) heart failure with preserved ejection fraction (HCC)     Comment:  a. 07/2019 Echo: EF 60-65%, mod LVH. Sev dil LA, mildly               dil RA. Mod elev PASP; b. 07/2020 TEE: EF 50-55%, no               rwma, sev conc LVH. Nl RV fxn. Mod dil RA. Mild-mod MR,               mod TR. No date: Agatston coronary artery calcium score between 200 and 399     Comment:  a. 07/2020 Cardiac CT: Cor Ca2+ = 338 (99th %'ile). 20 years ago: Complication of anesthesia     Comment:  a. spinal with first c section went too high stopped               breathing, low bp with 2nd c section, no further issues               with anesthesia No date: Heart murmur     Comment:  a. 07/2019 Echo: no significant valvular dzs; b. 07/2020              TEE: Mild-mod MR, mod TR. No date: History of hiatal hernia No date: Hypertrophic cardiomyopathy (HCC)     Comment:  a. 07/2019 Echo: Mod LVH; b. 01/2020 cMRI: EF 67%, HCM               w/o obstruction. Max wall thickness 19mm. Sev LAE w/ L->R              atrial level shunt. Large area of LGE @ mid-ventricular               level in area of max wall thickness; c. 07/2020 Cardiac                CT: Asymm hypertrophy up to 19mm in mid inferoseptum               consistent w/ known HCM. No date: Morbid obesity (HCC)     Comment:  a. 09/2014 s/p gastric bypass. No date: Persistent atrial fibrillation (HCC)     Comment:  a. Dx 07/2019-->CHA2DS2VASc = 2 (diast               CHF/Fem)-->Eliquis; b. 09/2018 s/p DCCV (150J (biphasic) x              1); c. 12/2019 RFCA/PVI; d. 07/2020 repeat RFCA/PVI. No date: Rash     Comment:  both arms from old bed bug bites, healing No date: Stroke [facility]) No date: Tobacco  abuse No date: Typical atrial flutter (HCC)     Comment:  a. 12/2019 s/p RFCA.  Past Surgical History: No date: ABDOMINAL HYSTERECTOMY No date: ANKLE SURGERY     Comment:  left x 2 12/24/2019: ATRIAL FIBRILLATION ABLATION; N/A     Comment:  Procedure: ATRIAL FIBRILLATION ABLATION;  Surgeon:               Regan Lemming, MD;  Location: [facility];               Service: Cardiovascular;  Laterality: N/A; 08/11/2020: ATRIAL FIBRILLATION ABLATION; N/A     Comment:  Procedure: ATRIAL FIBRILLATION ABLATION;  Surgeon:               Regan Lemming, MD;  Location: [facility];               Service: Cardiovascular;  Laterality: N/A; 09/24/2019: CARDIOVERSION; N/A     Comment:  Procedure: CARDIOVERSION;  Surgeon: Antonieta Iba,               MD;  Location: [facility] ORS;  Service: Cardiovascular;                Laterality: N/A; 10/17/2019: CARDIOVERSION; N/A     Comment:  Procedure: CARDIOVERSION;  Surgeon: Antonieta Iba,               MD;  Location: [facility] ORS;  Service: Cardiovascular;                Laterality: N/A; 11/08/2021: CARDIOVERSION; N/A     Comment:  Procedure: CARDIOVERSION;  Surgeon: Antonieta Iba,               MD;  Location: [facility] ORS;  Service: Cardiovascular;                Laterality: N/A; No date: CESAREAN SECTION     Comment:  x 3 03/06/2022: CLIPPING OF ATRIAL APPENDAGE; N/A     Comment:  Procedure: CLIPPING OF  ATRIAL APPENDAGE USING ATRICURE                CLIP SIZE ;  Surgeon: Loreli Slot, MD;                Location: [facility] OR;  Service: Open Heart Surgery;                Laterality: N/A; 03/06/2022: EXCISION OF ATRIAL MYXOMA; N/A     Comment:  Procedure: RESECTION OF AORTIC VALVE TUMOR;  Surgeon:               Loreli Slot, MD;  Location: [facility] OR;  Service:               Open Heart Surgery;  Laterality: N/A; No date: HERNIA REPAIR     Comment:  x2 one was for sure a hiatal hernia 12/15/2022: ICD IMPLANT; N/A     Comment:  Procedure: ICD IMPLANT;  Surgeon: Regan Lemming,               MD;  Location: [facility];  Service:               Cardiovascular;  Laterality: N/A; 11/13/2021: IR ANGIO EXTRACRAN SEL COM CAROTID INNOMINATE UNI L MOD  SED 11/13/2021: IR CT HEAD LTD 11/13/2021: IR PERCUTANEOUS ART THROMBECTOMY/INFUSION INTRACRANIAL  INC DIAG ANGIO 03/14/2022: IR THORACENTESIS ASP PLEURAL SPACE W/IMG GUIDE 2020: LAPAROSCOPIC GASTRIC SLEEVE  RESECTION 08/17/2015: LAPAROSCOPIC GASTRIC SLEEVE RESECTION WITH HIATAL HERNIA  REPAIR     Comment:  Procedure: LAPAROSCOPIC GASTRIC SLEEVE RESECTION WITH               HIATAL HERNIA REPAIR;  Surgeon: Luretha Murphy, MD;                Location: [facility] ORS;  Service: General;; 10/30/2022: LEFT HEART CATH AND CORONARY ANGIOGRAPHY; N/A     Comment:  Procedure: LEFT HEART CATH AND CORONARY ANGIOGRAPHY;                Surgeon: Iran Ouch, MD;  Location: [facility] INVASIVE               CV LAB;  Service: Cardiovascular;  Laterality: N/A; 03/06/2022: MAZE; N/A     Comment:  Procedure: MAZE;  Surgeon: Loreli Slot, MD;                Location: [facility] OR;  Service: Open Heart Surgery;                Laterality: N/A; 11/12/2021: RADIOLOGY WITH ANESTHESIA; N/A     Comment:  Procedure: IR WITH ANESTHESIA;  Surgeon: Julieanne Cotton, MD;  Location: [facility] OR;  Service: Radiology;                Laterality:  N/A; 08/11/2020: TEE WITHOUT CARDIOVERSION; N/A     Comment:  Procedure: TRANSESOPHAGEAL ECHOCARDIOGRAM (TEE);                Surgeon: Regan Lemming, MD;  Location: [facility] INVASIVE              CV LAB;  Service: Cardiovascular;  Laterality: N/A; 11/08/2021: TEE WITHOUT CARDIOVERSION; N/A     Comment:  Procedure: TRANSESOPHAGEAL ECHOCARDIOGRAM (TEE);                Surgeon: Antonieta Iba, MD;  Location: [facility] ORS;                Service: Cardiovascular;  Laterality: N/A; 03/06/2022: TEE WITHOUT CARDIOVERSION; N/A     Comment:  Procedure: TRANSESOPHAGEAL ECHOCARDIOGRAM (TEE);                Surgeon: Loreli Slot, MD;  Location[facility] OR;                Service: Open Heart Surgery;  Laterality: N/A; No date: TUBAL LIGATION     Comment:  x 2 08/17/2015: UPPER GI ENDOSCOPY     Comment:  Procedure: UPPER GI ENDOSCOPY;  Surgeon: Luretha Murphy,              MD;  Location: [facility] ORS;  Service: General;;  BMI    Body Mass Index: 27.46 kg/m      Reproductive/Obstetrics negative OB ROS                              Anesthesia Physical Anesthesia Plan  ASA:   Anesthesia Plan: General ETT   Post-op Pain Management:    Induction:   PONV Risk Score and Plan: 2 and Ondansetron, Dexamethasone and Midazolam  Airway Management Planned:   Additional Equipment:   Intra-op Plan:   Post-operative Plan:   Informed Consent:      Dental Advisory Given  Plan Discussed with: CRNA and Surgeon  Anesthesia Plan  Comments:          Anesthesia Quick Evaluation

## 2023-11-26 NOTE — Interval H&P Note (Signed)
 History and Physical Interval Note:  11/26/2023 7:11 AM  Hannah Zimmerman  has presented today for surgery, with the diagnosis of K81.2 acute cholecystitis.  The various methods of treatment have been discussed with the patient and family. After consideration of risks, benefits and other options for treatment, the patient has consented to  Procedure(s): CHOLECYSTECTOMY, ROBOT-ASSISTED, LAPAROSCOPIC (N/A) as a surgical intervention.  The patient's history has been reviewed, patient examined, no change in status, stable for surgery.  I have reviewed the patient's chart and labs.  Questions were answered to the patient's satisfaction.     Carolan Shiver

## 2023-11-26 NOTE — Anesthesia Postprocedure Evaluation (Signed)
 Anesthesia Post Note  Patient: Hannah Zimmerman  Procedure(s) Performed: CHOLECYSTECTOMY, ROBOT-ASSISTED, LAPAROSCOPIC  Patient location during evaluation: PACU Anesthesia Type: General Level of consciousness: awake and alert Pain management: pain level controlled Vital Signs Assessment: post-procedure vital signs reviewed and stable Respiratory status: spontaneous breathing, nonlabored ventilation, respiratory function stable and patient connected to nasal cannula oxygen Cardiovascular status: blood pressure returned to baseline and stable Postop Assessment: no apparent nausea or vomiting Anesthetic complications: no   There were no known notable events for this encounter.   Last Vitals:  Vitals:   11/26/23 1000 11/26/23 1012  BP: 109/78 107/76  Pulse: 71 70  Resp: 16 16  Temp: 36.4 C (!) 36.3 C  SpO2: 95% 93%    Last Pain:  Vitals:   11/26/23 1012  TempSrc: Temporal  PainSc: 0-No pain                 Louie Boston

## 2023-11-26 NOTE — Anesthesia Preprocedure Evaluation (Signed)
 Anesthesia Evaluation  Patient identified by MRN, date of birth, ID band Patient awake    Reviewed: Allergy & Precautions, NPO status , Patient's Chart, lab work & pertinent test results  History of Anesthesia Complications Negative for: history of anesthetic complications  Airway Mallampati: III  TM Distance: >3 FB Neck ROM: full    Dental  (+) Poor Dentition, Missing   Pulmonary neg pulmonary ROS, former smoker   Pulmonary exam normal        Cardiovascular Exercise Tolerance: Good pulmonary hypertension+ CAD, + Past MI and +CHF  Normal cardiovascular exam+ Valvular Problems/Murmurs   Echo 10/2022 IMPRESSIONS     1. Left ventricular ejection fraction, by estimation, is 65 to 70%. The  left ventricle has normal function. The left ventricle has no regional  wall motion abnormalities. There is moderate asymmetric left ventricular  hypertrophy of the septal segment.  Left ventricular diastolic parameters are indeterminate. No significant  LVOT gradient even with Valsalva.   2. Right ventricular systolic function is normal. The right ventricular  size is normal. There is mildly elevated pulmonary artery systolic  pressure.   3. Left atrial size was mildly dilated.   4. Right atrial size was mildly dilated.   5. The mitral valve is normal in structure. Mild mitral valve  regurgitation. No evidence of mitral stenosis.   6. Tricuspid valve regurgitation is moderate.   7. The aortic valve is normal in structure. Aortic valve regurgitation is  not visualized. No aortic stenosis is present.   8. The inferior vena cava is normal in size with <50% respiratory  variability, suggesting right atrial pressure of 8 mmHg.     Neuro/Psych  PSYCHIATRIC DISORDERS  Depression    CVA, Residual Symptoms    GI/Hepatic negative GI ROS, Neg liver ROS,,,  Endo/Other  negative endocrine ROS    Renal/GU      Musculoskeletal   Abdominal    Peds  Hematology  (+) Blood dyscrasia, anemia   Anesthesia Other Findings Past Medical History: No date: (HFpEF) heart failure with preserved ejection fraction (HCC)     Comment:  a. 07/2019 Echo: EF 60-65%, mod LVH. Sev dil LA, mildly               dil RA. Mod elev PASP; b. 07/2020 TEE: EF 50-55%, no               rwma, sev conc LVH. Nl RV fxn. Mod dil RA. Mild-mod MR,               mod TR. No date: Agatston coronary artery calcium score between 200 and 399     Comment:  a. 07/2020 Cardiac CT: Cor Ca2+ = 338 (99th %'ile). 20 years ago: Complication of anesthesia     Comment:  a. spinal with first c section went too high stopped               breathing, low bp with 2nd c section, no further issues               with anesthesia No date: Heart murmur     Comment:  a. 07/2019 Echo: no significant valvular dzs; b. 07/2020              TEE: Mild-mod MR, mod TR. No date: History of hiatal hernia No date: Hypertrophic cardiomyopathy (HCC)     Comment:  a. 07/2019 Echo: Mod LVH; b. 01/2020 cMRI: EF 67%, HCM  w/o obstruction. Max wall thickness 19mm. Sev LAE w/ L->R              atrial level shunt. Large area of LGE @ mid-ventricular               level in area of max wall thickness; c. 07/2020 Cardiac               CT: Asymm hypertrophy up to 19mm in mid inferoseptum               consistent w/ known HCM. No date: Morbid obesity (HCC)     Comment:  a. 09/2014 s/p gastric bypass. No date: Persistent atrial fibrillation (HCC)     Comment:  a. Dx 07/2019-->CHA2DS2VASc = 2 (diast               CHF/Fem)-->Eliquis; b. 09/2018 s/p DCCV (150J (biphasic) x              1); c. 12/2019 RFCA/PVI; d. 07/2020 repeat RFCA/PVI. No date: Rash     Comment:  both arms from old bed bug bites, healing No date: Stroke [facility]) No date: Tobacco abuse No date: Typical atrial flutter (HCC)     Comment:  a. 12/2019 s/p RFCA.  Past Surgical History: No date: ABDOMINAL HYSTERECTOMY No date: ANKLE  SURGERY     Comment:  left x 2 12/24/2019: ATRIAL FIBRILLATION ABLATION; N/A     Comment:  Procedure: ATRIAL FIBRILLATION ABLATION;  Surgeon:               Regan Lemming, MD;  Location: [facility];               Service: Cardiovascular;  Laterality: N/A; 08/11/2020: ATRIAL FIBRILLATION ABLATION; N/A     Comment:  Procedure: ATRIAL FIBRILLATION ABLATION;  Surgeon:               Regan Lemming, MD;  Location: [facility];               Service: Cardiovascular;  Laterality: N/A; 09/24/2019: CARDIOVERSION; N/A     Comment:  Procedure: CARDIOVERSION;  Surgeon: Antonieta Iba,               MD;  Location: [facility] ORS;  Service: Cardiovascular;                Laterality: N/A; 10/17/2019: CARDIOVERSION; N/A     Comment:  Procedure: CARDIOVERSION;  Surgeon: Antonieta Iba,               MD;  Location: [facility] ORS;  Service: Cardiovascular;                Laterality: N/A; 11/08/2021: CARDIOVERSION; N/A     Comment:  Procedure: CARDIOVERSION;  Surgeon: Antonieta Iba,               MD;  Location: [facility] ORS;  Service: Cardiovascular;                Laterality: N/A; No date: CESAREAN SECTION     Comment:  x 3 03/06/2022: CLIPPING OF ATRIAL APPENDAGE; N/A     Comment:  Procedure: CLIPPING OF ATRIAL APPENDAGE USING ATRICURE                CLIP SIZE ;  Surgeon: Loreli Slot, MD;                Location: [facility] OR;  Service: Open Heart Surgery;  Laterality: N/A; 03/06/2022: EXCISION OF ATRIAL MYXOMA; N/A     Comment:  Procedure: RESECTION OF AORTIC VALVE TUMOR;  Surgeon:               Loreli Slot, MD;  Location: [facility] OR;  Service:               Open Heart Surgery;  Laterality: N/A; No date: HERNIA REPAIR     Comment:  x2 one was for sure a hiatal hernia 12/15/2022: ICD IMPLANT; N/A     Comment:  Procedure: ICD IMPLANT;  Surgeon: Regan Lemming,               MD;  Location: [facility];  Service:                Cardiovascular;  Laterality: N/A; 11/13/2021: IR ANGIO EXTRACRAN SEL COM CAROTID INNOMINATE UNI L MOD  SED 11/13/2021: IR CT HEAD LTD 11/13/2021: IR PERCUTANEOUS ART THROMBECTOMY/INFUSION INTRACRANIAL  INC DIAG ANGIO 03/14/2022: IR THORACENTESIS ASP PLEURAL SPACE W/IMG GUIDE 2020: LAPAROSCOPIC GASTRIC SLEEVE RESECTION 08/17/2015: LAPAROSCOPIC GASTRIC SLEEVE RESECTION WITH HIATAL HERNIA  REPAIR     Comment:  Procedure: LAPAROSCOPIC GASTRIC SLEEVE RESECTION WITH               HIATAL HERNIA REPAIR;  Surgeon: Luretha Murphy, MD;                Location: [facility] ORS;  Service: General;; 10/30/2022: LEFT HEART CATH AND CORONARY ANGIOGRAPHY; N/A     Comment:  Procedure: LEFT HEART CATH AND CORONARY ANGIOGRAPHY;                Surgeon: Iran Ouch, MD;  Location: [facility] INVASIVE               CV LAB;  Service: Cardiovascular;  Laterality: N/A; 03/06/2022: MAZE; N/A     Comment:  Procedure: MAZE;  Surgeon: Loreli Slot, MD;                Location: [facility] OR;  Service: Open Heart Surgery;                Laterality: N/A; 11/12/2021: RADIOLOGY WITH ANESTHESIA; N/A     Comment:  Procedure: IR WITH ANESTHESIA;  Surgeon: Julieanne Cotton, MD;  Location: [facility] OR;  Service: Radiology;                Laterality: N/A; 08/11/2020: TEE WITHOUT CARDIOVERSION; N/A     Comment:  Procedure: TRANSESOPHAGEAL ECHOCARDIOGRAM (TEE);                Surgeon: Regan Lemming, MD;  Location: [facility] INVASIVE              CV LAB;  Service: Cardiovascular;  Laterality: N/A; 11/08/2021: TEE WITHOUT CARDIOVERSION; N/A     Comment:  Procedure: TRANSESOPHAGEAL ECHOCARDIOGRAM (TEE);                Surgeon: Antonieta Iba, MD;  Location: [facility] ORS;                Service: Cardiovascular;  Laterality: N/A; 03/06/2022: TEE WITHOUT CARDIOVERSION; N/A     Comment:  Procedure: TRANSESOPHAGEAL ECHOCARDIOGRAM (TEE);                Surgeon: Loreli Slot, MD;  Location: [facility] OR;  Service:  Open Heart Surgery;  Laterality: N/A; No date: TUBAL LIGATION     Comment:  x 2 08/17/2015: UPPER GI ENDOSCOPY     Comment:  Procedure: UPPER GI ENDOSCOPY;  Surgeon: Luretha Murphy,              MD;  Location: [facility] ORS;  Service: General;;  BMI    Body Mass Index: 27.46 kg/m      Reproductive/Obstetrics negative OB ROS                             Anesthesia Physical Anesthesia Plan  ASA: 3  Anesthesia Plan: General ETT   Post-op Pain Management: Toradol IV (intra-op)*, Ofirmev IV (intra-op)* and Dilaudid IV   Induction: Intravenous  PONV Risk Score and Plan: 3 and Ondansetron, Dexamethasone, Midazolam and Treatment may vary due to age or medical condition  Airway Management Planned: Oral ETT  Additional Equipment:   Intra-op Plan:   Post-operative Plan: Extubation in OR  Informed Consent: I have reviewed the patients History and Physical, chart, labs and discussed the procedure including the risks, benefits and alternatives for the proposed anesthesia with the patient or authorized representative who has indicated his/her understanding and acceptance.     Dental Advisory Given  Plan Discussed with: Anesthesiologist, CRNA and Surgeon  Anesthesia Plan Comments: (Patient consented for risks of anesthesia including but not limited to:  - adverse reactions to medications - damage to eyes, teeth, lips or other oral mucosa - nerve damage due to positioning  - sore throat or hoarseness - Damage to heart, brain, nerves, lungs, other parts of body or loss of life  Patient voiced understanding and assent.)        Anesthesia Quick Evaluation

## 2023-11-26 NOTE — Anesthesia Procedure Notes (Signed)
 Procedure Name: Intubation Date/Time: 11/26/2023 7:42 AM  Performed by: Lysbeth Penner, CRNAPre-anesthesia Checklist: Patient identified, Emergency Drugs available, Suction available and Patient being monitored Patient Re-evaluated:Patient Re-evaluated prior to induction Oxygen Delivery Method: Circle system utilized Preoxygenation: Pre-oxygenation with 100% oxygen Induction Type: IV induction Ventilation: Mask ventilation without difficulty Laryngoscope Size: McGrath and 3 Grade View: Grade II Tube type: Oral Tube size: 6.5 mm Number of attempts: 1 Airway Equipment and Method: Stylet and Oral airway Placement Confirmation: ETT inserted through vocal cords under direct vision, positive ETCO2 and breath sounds checked- equal and bilateral Secured at: 20 cm Tube secured with: Tape Dental Injury: Teeth and Oropharynx as per pre-operative assessment

## 2023-11-26 NOTE — Op Note (Signed)
 Preoperative diagnosis: Acute over chronic cholecystitis  Postoperative diagnosis: Acute over chronic cholecystitis  Procedure: Robotic Assisted Laparoscopic Cholecystectomy.   Anesthesia: GETA   Surgeon: Dr. Hazle Quant  Wound Classification: Clean Contaminated  Indications: Patient is a 53 y.o. female developed right upper quadrant pain and on workup was found to have cholelithiasis with a normal common duct and cholecystitis. Robotic Assisted Laparoscopic cholecystectomy was elected.  Findings: Abundant amount of adhesions from previous sleeve gastrectomy Severe pericholecystic edema Critical view of safety achieved Cystic duct and artery identified, ligated and divided Early cirrhotic changes of the liver Adequate hemostasis    Description of procedure: The patient was placed on the operating table in the supine position. General anesthesia was induced. A time-out was completed verifying correct patient, procedure, site, positioning, and implant(s) and/or special equipment prior to beginning this procedure. An orogastric tube was placed. The abdomen was prepped and draped in the usual sterile fashion.  An incision was made in a natural skin line below the umbilicus.  The fascia was elevated and the Veress needle inserted. Proper position was confirmed by aspiration and saline meniscus test.  The abdomen was insufflated with carbon dioxide to a pressure of 15 mmHg. The patient tolerated insufflation well. A 8-mm trocar was then inserted in optiview fashion.  The laparoscope was inserted and the abdomen inspected. No injuries from initial trocar placement were noted. Additional trocars were then inserted in the following locations: an 8-mm trocar in the left lateral abdomen, and another two 8-mm trocars to the right side of the abdomen 5 cm appart. The umbilical trocar was changed to a 12 mm trocar all under direct visualization. The abdomen was inspected and no abnormalities were  found. The table was placed in the reverse Trendelenburg position with the right side up. The robotic arms were docked and target anatomy identified. Instrument inserted under direct visualization.  Abundant amount of adhesions between the gallbladder and omentum, duodenum and transverse colon were lysed with electrocautery.  There was a very difficult and time-consuming lysis of adhesions.  The dome of the gallbladder was grasped with a prograsp and retracted over the dome of the liver. The infundibulum was also grasped with an atraumatic grasper and retracted toward the right lower quadrant. This maneuver exposed Calot's triangle. The peritoneum overlying the gallbladder infundibulum was then incised and the cystic duct and cystic artery identified and circumferentially dissected.  Significant pericholecystic edema was identified.  Critical view of safety reviewed before ligating any structure. Firefly images taken to visualize biliary ducts. The cystic duct and cystic artery were then doubly clipped and divided close to the gallbladder.  The gallbladder was then dissected from its peritoneal attachments by electrocautery. Hemostasis was checked and the gallbladder and contained stones were removed using an endoscopic retrieval bag. The gallbladder was passed off the table as a specimen. The gallbladder fossa was copiously irrigated with saline and hemostasis was obtained. There was no evidence of bleeding from the gallbladder fossa or cystic artery or leakage of the bile from the cystic duct stump. Secondary trocars were removed under direct vision. No bleeding was noted. The robotic arms were undoked. The scope was withdrawn and the umbilical trocar removed. The abdomen was allowed to collapse. The fascia of the 12mm trocar sites was closed with figure-of-eight 0 vicryl sutures. The skin was closed with subcuticular sutures of 4-0 monocryl and topical skin adhesive. The orogastric tube was removed.  The  patient tolerated the procedure well and was taken to the  postanesthesia care unit in stable condition.   Specimen: Gallbladder  Complications: None  EBL: 5 mL

## 2023-11-26 NOTE — Transfer of Care (Addendum)
 Immediate Anesthesia Transfer of Care Note  Patient: Hannah Zimmerman  Procedure(s) Performed: CHOLECYSTECTOMY, ROBOT-ASSISTED, LAPAROSCOPIC  Patient Location: PACU  Anesthesia Type:General  Level of Consciousness: drowsy  Airway & Oxygen Therapy: Patient Spontanous Breathing and Patient connected to face mask oxygen  Post-op Assessment: Report given to RN and Post -op Vital signs reviewed and stable  Post vital signs: Reviewed and stable  Last Vitals:  Vitals Value Taken Time  BP 110/77 11/26/23 0902  Temp    Pulse 69 11/26/23 0909  Resp 17 11/26/23 0909  SpO2 96 % 11/26/23 0909  Vitals shown include unfiled device data.  Last Pain:  Vitals:   11/26/23 0612  TempSrc: Temporal  PainSc: 0-No pain         Complications: There were no known notable events for this encounter.

## 2023-11-27 LAB — SURGICAL PATHOLOGY

## 2023-11-29 ENCOUNTER — Telehealth: Payer: Self-pay | Admitting: Cardiovascular Disease

## 2023-11-29 NOTE — Telephone Encounter (Signed)
 The patient called to report that she noticed swelling in her hands and feet on Monday, prior to her surgery, and the swelling has continued. She denies shortness of breath but reported sometimes feeling like she needs to take a deeper breath. The patient denies chest pain but mentioned she has gained 5 lbs since Monday. She stated she is currently taking 40 mg of Lasix daily but noted that she took an extra 20 mg yesterday.  An appointment is scheduled for the first available slot on 12/10/23. However, the nurse will forward the message to Dr. Mariah Milling for recommendations in the interim. The patient was also made aware of emergency department precautions should any new symptoms develop or worsen.

## 2023-11-29 NOTE — Telephone Encounter (Signed)
 Pt c/o swelling/edema: STAT if pt has developed SOB within 24 hours  If swelling, where is the swelling located? Hands and feet. She states she can feel in her chest as well   How much weight have you gained and in what time span? Not sure    Have you gained 2 pounds in a day or 5 pounds in a week? Not sure   Do you have a log of your daily weights (if so, list)? No   Are you currently taking a fluid pill? Yes took an extra one   Are you currently SOB? No   Have you traveled recently in a car or plane for an extended period of time? No

## 2023-11-29 NOTE — Progress Notes (Signed)
 [facility] [address] [place]. [place] [address] [phone]       OFFICE FOLLOW UP NOTE  [name] Date of Birth:  Jan 09, 1971 Medical Record Number:  [id number]    Primary neurologist: Dr. [name] Reason for visit: Stroke follow-up, tension headaches   Chief complaint: Chief Complaint  Patient presents with   Follow-up    Pt in room 3 alone. Here for stoke follow up. Pt reports she still has migraines,  taking amitriptyline, nervous to take Nurtec pharmacist told her mixing with amiodarone can cause possible death. Reports 12 migraines in last week.      HPI:  Update 12/03/2023 JM: Patient returns for 14-month follow-up unaccompanied.  At prior visit, was started on amitriptyline 25mg  qhs for persistent daily headaches, topiramate discontinued due to brain fog sensation and started Nurtec as needed for more severe headaches.  She reports continued headaches although improved since initiating amitriptyline, currently experiencing about 15 to 16/month with about 5 severe migraines per month.  She has not yet tried Nurtec as pharmacist mentioned interaction with amiodarone.  Use of Tylenol as needed for more severe migraines with limited benefit, denies overuse.  Headaches continue to be present in occipital area, can be tender to touch.  Headaches occasionally associated with visual aura.  She also mentions worsening balance over the past 6 weeks.  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  Has had a couple falls but thankfully no significant injury.  Denies dizziness, lightheadedness or vertigo sensation.  Denies lower extremity numbness/tingling.  Denies swallowing difficulty.  Does have chronic slurred speech post stroke which can fluctuate, notes worsening slurred speech with stress which she feels is worse from baseline.  She  feels generalized weakness, like she has 20 pound weights on her arms and legs, notes increased issues with edema and has follow-up visit next week with cardiology to further discuss.  She did start PT about 5 weeks ago at [facility] clinic due to bilateral lower extremity weakness (ordered by PCP) but was only able to participate in 1 session due to episode of shingles and recently undergoing cholecystectomy on 3/10.  Also notes worsening blurred vision bilaterally, denies visual loss or diplopia, has not yet seen ophthalmology.  She continues to participate in [place] research trial (on apixaban or placebo), remains on pravastatin.  Monitors glucose levels at home which have been normal, monitors blood pressure at home and typically ranges 100/60-80s.      History provided for reference purposes only Update 05/30/2023 JM: Patient returns for follow-up visit accompanied by her daughter.  At prior visit, complained of daily bitemporal headaches, suspected tension type headaches with analgesic rebound component and cervical muscle tightness, started on topiramate 25 mg BID. Repeat CTH no new or acute findings.   Today, she complains of persistent daily headaches, denies benefit with topiramate 25 mg BID, reports worsening brain fog on current dosage.  She can have worsening headaches about 3x per week which are debilitating.  Located occipital mainly but can be generalized at times. Will use Tylenol only occasionally but denies benefit.  Does have chronic neck pain, recent cervical x-ray showed degenerative disc disease worse at C5-C6 and C6-C7, reports PCP plans on sending to specialist for further evaluation.   She also mentions fluctuation of left hand weakness with numbness/tingling, slurred speech and imbalance. This has been persistent since her stroke. Denies  correlation with specific triggers such as fatigue, stress, overexertion or headaches. She tries to stay active, plans on starting water aerobics  soon. Denies new stroke/TIA symptoms.  Remains on Eliquis, aspirin and pravastatin.  Routinely follows with PCP and cardiology for stroke risk factor management.  Update 02/01/2023 Dr. [name] : She returns for follow-up after last visit 6 months ago.  She states she is doing well.  She has had no recurrent stroke or TIA symptoms.  She had an ICD placed on 12/15/2022 by Dr. [name] and a week later while in the grocery store she felt sharp.  He is rehospitalized and found to have rapid heart rate and started on amiodarone.  He has done well since then.  She however has a new complaint of daily headaches that she is after last 6 months.  She describes  these as bitemporal moderate severity occasionally spreading to the back of her head.  There is no accompanying nausea or vomiting or light or sound sensitivities.  She does have to lie down sometimes to get relief.  She takes Tylenol which takes the edge off but headache does not completely go away.  She does admit to some muscle tightness in the back of her head and neck.  Has no prior history of similar headaches.  She does have remote history of migraine headaches but those are quite different.  She is unable to identify specific triggers or relieving factors for the headaches.  She has not had any recent brain imaging done.  She remains on Eliquis which she is tolerating well with easy bruising but no bleeding episodes.  She is tolerating Pravachol well without muscle aches and pains.  Last lipid profile on 10/29/2022 showed LDL cholesterol to be 75 mg percent.  Hemoglobin A1c was borderline at 6.1.  She continues to have mild short-term memory difficulties but these stable and nonprogressive.  She is independent in all activities of daily living.  Update 08/02/2022 Dr. [name]; she returns for follow-up after last visit with  me 6 months ago.  She is accompanied by her daughter.  Patient states she had open heart surgery with resection of aortic valve fibroelastoma  with clipping of left atrial appendage on 03/06/2022.  Surgery went well without complications but she has noticed worsening of her existing stroke symptoms with speech and word finding difficulties, depth perception on the right and she is tired.  She denies any new strokelike symptoms.  She is back on Eliquis and is tolerating it well without bleeding or bruising.  She is tolerating Lipitor well without muscle aches.  Her blood pressure is under good control and today it is 130/93.  Having mild short-term memory difficulties and trouble remembering recent information.  He has not been participating in cognitively challenging activities.  He has not had any evaluation for her memory loss.  Initial visit 01/18/22 Dr. [name] ; Mr. Doerr is a 53 year old pleasant Caucasian lady seen today for initial office consultation visit for stroke.  History is obtained from the patient and her daughter as well as review of electronic medical records and opossum reviewed pertinent available imaging films in PACS.  She has past medical history of atrial fibrillation/flutter, congestive heart failure, tobacco abuse who presented to [facility] on [facility]/12/2021 with sudden onset of lethargy, left-sided weakness, slurred speech and gaze deviation.  NIH stroke scale was 24 on admission.  She was not a candidate for IV thrombolysis as she was on Eliquis.  Noncontrast CAT scan  of the head showed aspect score of 10 with developing some hypodensity in the right hemisphere and dense right MCA sign.  CT angiogram of the head and neck showed nonopacification proximal to mid right ICA extending into the intracranial portion of the terminus and proximal right MCA.  Patient was emergently transferred to [facility] and underwent successful mechanical thrombectomy with TICI3 revascularization by Dr. Corliss Skains.  She was kept in the intensive care unit and closely monitored and blood pressure was tightly controlled.  She  did very well and noticed significant improvement in the left-sided deficits.  Echocardiogram showed ejection fraction of 60 to 65% with severe concentric left ventricular hypertrophy.  LDL cholesterol was 87 mg percent.  Hemoglobin A1c of 6.1.  MRI scan of the brain shows patchy infarction in the right basal ganglia with some mild petechial hemorrhage.  There was also subtle diffusion signal in the adjacent right frontal and insular cortex.  Discussion was made about switching Eliquis to alternative anticoagulants but patient's insurance co-pay would be quite high and hence it was decided to continue Eliquis.  Patient was discharged home with home physical and Occupational Therapy.  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  The patient states her left-sided deficits have mostly resolved.  She has cut back smoking but still smokes about 6 cigarettes a day.  She is complaining of chronic headaches since her stroke.  These are mostly in the back of the head and top of the neck they are mild to moderate dull in quality.  No accompanying light or sound sensitivity or nausea.  She takes Tylenol 2 tablets twice daily for last several months.  She does admit to significant increase stress in her life and she has not been participating in any stress laxation activity.  She does complain of decreased strength and difficulty with focusing and plans to see an eye doctor.     ROS:   14 system review of systems is positive for those listed in HPI and all other systems negative  PMH:  Past Medical History:  Diagnosis Date   (HFpEF) heart failure with preserved ejection fraction (HCC)    a. 07/2019 Echo: EF 60-65%, mod LVH. Sev dil LA, mildly dil RA. Mod elev PASP; b. 07/2020 TEE: EF 50-55%, no rwma, sev conc LVH. Nl RV fxn. Mod dil RA. Mild-mod MR, mod TR.   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  07/2020 Cardiac CT: Cor Ca2+ = 338 (99th %'ile).   Complication of anesthesia 20 years ago   a. spinal with first c section went too high stopped breathing, low bp with 2nd c section, no further issues with anesthesia   Heart murmur    a. 07/2019 Echo: no significant valvular dzs; b. 07/2020 TEE: Mild-mod MR, mod TR.   History of hiatal hernia    Hypertrophic cardiomyopathy (HCC)    a. 07/2019 Echo: Mod LVH; b. 01/2020 cMRI: EF 67%, HCM w/o obstruction. Max wall thickness 19mm. Sev LAE w/ L->R atrial level shunt. Large area of LGE @ mid-ventricular level in area of max wall thickness; c. 07/2020 Cardiac CT: Asymm hypertrophy up to 19mm in mid inferoseptum consistent w/ known HCM.   Morbid obesity (HCC)    a. 09/2014 s/p gastric bypass.   Persistent atrial fibrillation (HCC)    a. Dx 07/2019-->CHA2DS2VASc = 2 (diast CHF/Fem)-->Eliquis; b. 09/2018 s/p  DCCV (150J (biphasic) x 1); c. 12/2019 RFCA/PVI; d. 07/2020 repeat RFCA/PVI.   Rash    both arms from old bed bug bites, healing   Stroke [facility])    Tobacco abuse    Typical atrial flutter (HCC)    a. 12/2019 s/p RFCA.    Social History:  Social History   Socioeconomic History   Marital status: Significant Other    Spouse name: Not on file   Number of children: 3   Years of education: two years of college   Highest education level: Not on file  Occupational History   Occupation: office assistant  Tobacco Use   Smoking status: Former    Current packs/day: 0.00    Average packs/day: 0.3 packs/day for 31.0 years (7.8 ttl pk-yrs)    Types: Cigarettes    Start date: 10/04/1991    Quit date: 10/03/2022    Years since quitting: 1.1    Passive exposure: Current   Smokeless tobacco: Never  Vaping Use   Vaping status: Never Used  Substance and Sexual Activity   Alcohol use: Yes    Alcohol/week: 1.0 - 2.0 standard drink of alcohol    Types: 1 - 2 Standard drinks or equivalent per week    Comment: Occaional use - 1-2 mixed drinks prior to stroke  since d/c nothing 11/30/21   Drug use: No   Sexual activity: Yes  Other Topics Concern   Not on file  Social History Narrative   Lives at home with significant other.   Some caffeine daily I her green tea.   Right-handed.   Social Drivers of Health   Financial Resource Strain: Medium Risk (11/23/2023)   Received from [facility]   Overall Financial Resource Strain (CARDIA)    Difficulty of Paying Living Expenses: Somewhat hard  Food Insecurity: Food Insecurity Present (11/23/2023)   Received from [facility]   Hunger Vital Sign    Worried About Running Out of Food in the Last Year: Sometimes true    Ran Out of Food in the Last Year: Sometimes true  Transportation Needs: No Transportation Needs (11/23/2023)   Received from [facility] - Transportation    In the past 12 months, has lack of transportation kept you from medical appointments or from getting medications?: No    Lack of Transportation (Non-Medical): No  Physical Activity: Insufficiently Active (03/05/2023)   Received from [facility], [facility]   Exercise Vital Sign    Days of Exercise per Week: 7 days    Minutes of Exercise per Session: 20 min  Stress: Stress Concern Present (03/05/2023)   Received from [facility], [facility]   [employer] of Occupational Health - Occupational Stress Questionnaire    Feeling of Stress : To some extent  Social Connections: Moderately Isolated (03/05/2023)   Received from [facility], [facility]   Social Connection and Isolation Panel [NHANES]    Frequency of Communication with Friends and Family: More than three times a week    Frequency of Social Gatherings with Friends and Family: More than three times a week    Attends Religious Services: More than 4 times per year    Active Member of [employer] or Organizations:  No    Attends Banker Meetings: Never    Marital Status: Divorced  Catering manager Violence: Not At Risk (01/03/2023)   Humiliation, Afraid,  Rape, and Kick questionnaire    Fear of Current or Ex-Partner: No    Emotionally Abused: No    Physically Abused: No    Sexually Abused: No    Medications:   Current Outpatient Medications on File Prior to Visit  Medication Sig Dispense Refill   acetaminophen (TYLENOL) 500 MG tablet Take 500 mg by mouth every 6 (six) hours as needed for moderate pain or headache.     amiodarone (PACERONE) 200 MG tablet Take 1 tablet (200 mg total) by mouth 2 (two) times daily. 180 tablet 3   amitriptyline (ELAVIL) 25 MG tablet TAKE 1 TABLET BY MOUTH EVERYDAY AT BEDTIME 30 tablet 0   aspirin EC 81 MG tablet Take 1 tablet (81 mg total) by mouth daily. Swallow whole. 30 tablet 12   b complex vitamins capsule Take 1 capsule by mouth daily.     cholecalciferol (VITAMIN D3) 25 MCG (1000 UNIT) tablet Take 1,000 Units by mouth daily.     furosemide (LASIX) 40 MG tablet Take 1 tablet (40 mg total) by mouth daily. 30 tablet 2   gabapentin (NEURONTIN) 100 MG capsule Take 100 mg by mouth at bedtime.     Omega-3 Fatty Acids (FISH OIL) 1000 MG CAPS Take 100 mg by mouth daily.     potassium chloride (KLOR-CON) 10 MEQ tablet Take 10 mEq by mouth daily.     pravastatin (PRAVACHOL) 20 MG tablet Take 20 mg by mouth daily.     STUDY - LIBREXIA-AF - apixaban 5 mg or placebo capsule (PI-Sethi) Take 1 capsule (5 mg total) by mouth 2 (two) times daily. Take at approximately the same time of day with or without food. Please bring bottle back with you to every visit; do not discard bottle. For Investigational Use only. 280 capsule 0   STUDY - LIBREXIA-AF - [id number] (Milvexian) 100 mg or placebo tablet (PI-Sethi) Take 1 tablet by mouth 2 (two) times daily. Take at approximately the same time of day with or without food. Please bring bottle back with you to every visit; do not  discard bottle. For Investigational Use only. 280 tablet 0   traMADol (ULTRAM) 50 MG tablet Take 1 tablet (50 mg total) by mouth every 6 (six) hours as needed for up to 10 days. 20 tablet 0   No current facility-administered medications on file prior to visit.    Allergies:   Allergies  Allergen Reactions   Atorvastatin Nausea Only   Atarax [Hydroxyzine] Palpitations   Codeine Hives        Hydrocodone Hives    Physical Exam Today's Vitals   12/03/23 0737  BP: 99/61  Pulse: 69  Weight: 164 lb 3.2 oz (74.5 kg)  Height: 5\' 4"  (1.626 m)   Body mass index is 28.18 kg/m.  General: well developed, well nourished, pleasant middle-age Caucasian female, seated, in no evident distress Head: head normocephalic and atraumatic.   Neck: supple with no carotid or supraclavicular bruits Cardiovascular: regular rate and rhythm, no murmurs Musculoskeletal: no deformity Skin:  no rash/petichiae; bilateral LE edema  Vascular:  Normal pulses all extremities  Neurologic Exam Mental Status: Awake and fully alert.  No evidence of aphasia or dysarthria during visit.  Oriented to place and time. Recent and remote memory intact. Attention span, concentration and fund of knowledge appropriate. Mood and affect appropriate. Cranial Nerves: Pupils equal, briskly reactive to light. Extraocular movements full without nystagmus. Visual fields full to confrontation. Hearing intact. Facial sensation intact. Face, tongue, palate moves normally  and symmetrically.  Motor: Normal bulk and tone. Normal strength in all tested extremity muscles except slightly weak left grip strength.  Diminished fine finger movements on the left.  Orbits right over left upper extremity.  Unable to appreciate lower extremity weakness. Sensory.: intact to touch , pinprick , position and vibratory sensation.  Coordination: Rapid alternating movements slightly impaired left hand. Finger-to-nose and heel-to-shin performed accurately  bilaterally. Gait and Station: Arises from chair without difficulty. Stance is normal. Gait demonstrates slow cautious steps with decreased stride length and step height bilaterally with increased difficulty with turns.  Able to tandem walk and heel toe without great difficulty.  Romberg positive. Reflexes: 1+ and symmetric. Toes downgoing.         ASSESSMENT: 53 year old Caucasian lady with right MCA infarct in February 2023 secondary to terminal right ICA and proximal right MCA occlusion from embolism from atrial fibrillation despite being on anticoagulation with Eliquis.  Patient has been extremely well with hardly any residual deficits.  Vascular risk factors of atrial fibrillation, hyperlipidemia, hypertension and smoking.  She has had worsening of her stroke deficits following an open heart surgery in June 2023 along with mild cognitive impairment which appears stable.  Complaints of daily persistent headache since around 08/2022 likely transformed tension headaches with analgesic rebound, CTH negative for new findings.  Today, 12/03/2023, reports worsening persistent imbalance over the past 6 weeks as well as worsening blurred vision.  Notes improvement of headaches on amitriptyline but still occurring 15 to 16/month and about 5 migrainous headaches per month.    PLAN  Tension headaches Migrainous type headaches Start Ajovy monthly injection Start Nurtec as needed, do not take more than 1 in a 48-hour period due to current use of amiodarone Continue amitriptyline 25 mg nightly -hesitant to increase further due to current use of amiodarone No current medication overuse Previously tried/failed: Amitriptyline, topiramate, triptans contraindicated  Gait impairment with imbalance Worsening blurred vision Chronic issue poststroke but has been more persistent, suspect multifactoral but due to history of prior stroke, recommend completion of CT head to rule out vascular etiology Recommend  restarting PT at [facility] clinic Recommend follow-up with ophthalmology for blurred vision concerns  R MCA stroke Reports fluctuation of left hand weakness with numbness, slurred speech and balance. Present since stroke onset.  Continue aspirin and pravastatin and participation in [place] research trial for secondary stroke prevention measures managed by PCP/cardiology. Reports use of aspirin recommended by cardiology, is not needed from a stroke standpoint.  Continue close follow-up with PCP for stroke risk factor management including BP goal<130/90 and HLD with LDL goal<70 and pre-DM with A1c goal <7    Follow-up in 4-6 months or call earlier if needed    I spent 45 minutes of face-to-face and non-face-to-face time with patient.  This included previsit chart review, lab review, study review, order entry, electronic health record documentation, patient education and discussion regarding above diagnoses and treatment plan and answered all other questions to patient satisfaction  Ihor Austin, [facility]  [facility] [address] [place], [place] [address]  Phone [phone] Fax [phone] Note: This document was prepared with digital dictation and possible smart phrase technology. Any transcriptional errors that result from this process are unintentional.

## 2023-11-30 NOTE — Telephone Encounter (Signed)
 Called patient and notified her of the following from Dr. Mariah Milling.   Would recommend she try Lasix 40 twice a day for the next 3 days in effort to get weight back down 5 pounds Then back to 40 daily Moderate fluid, salt intake Thx TGollan   Patient verbalized understanding.

## 2023-12-03 ENCOUNTER — Encounter: Payer: Self-pay | Admitting: Adult Health

## 2023-12-03 ENCOUNTER — Ambulatory Visit (INDEPENDENT_AMBULATORY_CARE_PROVIDER_SITE_OTHER): Payer: 59 | Admitting: Adult Health

## 2023-12-03 VITALS — BP 99/61 | HR 69 | Ht 64.0 in | Wt 164.2 lb

## 2023-12-03 DIAGNOSIS — G44229 Chronic tension-type headache, not intractable: Secondary | ICD-10-CM

## 2023-12-03 DIAGNOSIS — R2689 Other abnormalities of gait and mobility: Secondary | ICD-10-CM

## 2023-12-03 DIAGNOSIS — Z8673 Personal history of transient ischemic attack (TIA), and cerebral infarction without residual deficits: Secondary | ICD-10-CM

## 2023-12-03 DIAGNOSIS — H538 Other visual disturbances: Secondary | ICD-10-CM

## 2023-12-03 DIAGNOSIS — G43E11 Chronic migraine with aura, intractable, with status migrainosus: Secondary | ICD-10-CM | POA: Diagnosis not present

## 2023-12-03 MED ORDER — AJOVY 225 MG/1.5ML ~~LOC~~ SOAJ: 225.0000 mg | SUBCUTANEOUS | 11 refills | Status: DC

## 2023-12-03 MED ORDER — NURTEC 75 MG PO TBDP: 75.0000 mg | ORAL_TABLET | ORAL | 5 refills | Status: DC | PRN

## 2023-12-03 NOTE — Patient Instructions (Addendum)
 Start Ajovy injection every 30 days   Start Nurtec as needed - do not take more than 1 in a 48 hour period   You will be called to schedule a CT head to ensure no new stroke  Restart working with physical therapy to help with balance   Follow up with cardiology as scheduled next week   Schedule follow up visit with your eye doctor for blurred vision concerns      Follow up in 4-6 months or call earlier if needed

## 2023-12-05 ENCOUNTER — Encounter: Payer: Self-pay | Admitting: Cardiovascular Disease

## 2023-12-05 ENCOUNTER — Encounter: Payer: Self-pay | Admitting: Adult Health

## 2023-12-07 MED ORDER — FUROSEMIDE 40 MG PO TABS: ORAL_TABLET | ORAL | 2 refills | Status: DC

## 2023-12-07 NOTE — Telephone Encounter (Signed)
 Called patient and notified her of the following from Dr. Mariah Milling.   We can refill the Lasix  40 twice daily through the weekend and back to 40 daily  Limit fluid intake, compression hose, leg elevation  Thx  TG   Patient verbalizes understanding. Prescription sent to preferred pharmacy.

## 2023-12-09 NOTE — Progress Notes (Unsigned)
 Patient ID: Hannah Zimmerman, female   DOB: 09/06/1971, 53 y.o.   MRN: [id number] Cardiology Office Note  Date:  12/10/2023   ID:  Hannah Zimmerman, DOB 07-11-1971, MRN [id number]  PCP:  Wilford Corner, PA-C   Chief Complaint  Patient presents with   Edema    Patient c/o swelling in abdomen, feet, leg & hands swelling.     HPI:  [name] is a very pleasant 53 year old woman with history of  Moderate LVH, no outflow tract obstruction, maximal wall thickness 19 mm Severe left atrial enlargement Atrial fibrillation, ablation x 2 as well as surgical maze with left atrial appendage clipping ICD Cryptogenic stroke, aortic fibroblastoma, postsurgical removal obesity,  Smoking,  previous history of tachycardia,  pulmonary hypertension  Gastric bypass surgery 07/2015, down 130 pounds, stable weight Hx of VT who presents for routine follow-up of her tachycardia and pulmonary hypertension, atrial fibrillation  Last seen by myself in clinic 9/24  She reports having gallbladder surgery several weeks ago Prior to surgery had fluid retention Symptoms seem to get worse following surgery Arms and legs feel heavy Feels her weight is 15 pounds above her baseline Has been taking extra Lasix, recently 40 mg twice a day  Device download reviewed, OptiVol running high Denies excessive fluid or salt intake  Reports arrhythmia relatively well-controlled on amiodarone Unable to tolerate beta-blocker secondary to bradycardia  EKG personally reviewed by myself on todays visit EKG Interpretation Date/Time:  Monday December 10 2023 15:34:05 EDT Ventricular Rate:  70 PR Interval:  248 QRS Duration:  206 QT Interval:  550 QTC Calculation: 594 R Axis:   -47  Text Interpretation: AV dual-paced rhythm with prolonged AV conduction When compared with ECG of 18-Jun-2023 16:11, No significant change was found Confirmed by Julien Nordmann [phone]) on 12/10/2023 3:45:33 PM     Prior cardiac  procedures reviewed S/p EP study 12/24/2019 1. Persistent atrial fibrillation. 2. Typical appearing atrial flutter  CONCLUSIONS: 1. Sinus rhythm upon presentation.   2. Successful electrical isolation and anatomical encircling of all four pulmonary veins with radiofrequency current.   Further ablation across the roof of the left atrium. 3. Cavo-tricuspid isthmus ablation was performed with complete bidirectional isthmus block achieved.  4. No inducible arrhythmias following ablation both on and off of dobutamine 5.  Cardioversion   PMH:   has a past medical history of (HFpEF) heart failure with preserved ejection fraction (HCC), Agatston coronary artery calcium score between 200 and 399, Complication of anesthesia (20 years ago), Heart murmur, History of hiatal hernia, Hypertrophic cardiomyopathy (HCC), Morbid obesity (HCC), Persistent atrial fibrillation (HCC), Rash, Stroke (HCC), Tobacco abuse, and Typical atrial flutter (HCC).  PSH:    Past Surgical History:  Procedure Laterality Date   ABDOMINAL HYSTERECTOMY     ANKLE SURGERY     left x 2   ATRIAL FIBRILLATION ABLATION N/A 12/24/2019   Procedure: ATRIAL FIBRILLATION ABLATION;  Surgeon: Regan Lemming, MD;  Location: [facility];  Service: Cardiovascular;  Laterality: N/A;   ATRIAL FIBRILLATION ABLATION N/A 08/11/2020   Procedure: ATRIAL FIBRILLATION ABLATION;  Surgeon: Regan Lemming, MD;  Location: [facility];  Service: Cardiovascular;  Laterality: N/A;   CARDIOVERSION N/A 09/24/2019   Procedure: CARDIOVERSION;  Surgeon: Antonieta Iba, MD;  Location: [facility] ORS;  Service: Cardiovascular;  Laterality: N/A;   CARDIOVERSION N/A 10/17/2019   Procedure: CARDIOVERSION;  Surgeon: Antonieta Iba, MD;  Location: [facility] ORS;  Service: Cardiovascular;  Laterality: N/A;  CARDIOVERSION N/A 11/08/2021   Procedure: CARDIOVERSION;  Surgeon: Antonieta Iba, MD;  Location: [facility] ORS;  Service: Cardiovascular;   Laterality: N/A;   CESAREAN SECTION     x 3   CLIPPING OF ATRIAL APPENDAGE N/A 03/06/2022   Procedure: CLIPPING OF ATRIAL APPENDAGE USING ATRICURE  CLIP SIZE ;  Surgeon: Loreli Slot, MD;  Location: [facility] OR;  Service: Open Heart Surgery;  Laterality: N/A;   EXCISION OF ATRIAL MYXOMA N/A 03/06/2022   Procedure: RESECTION OF AORTIC VALVE TUMOR;  Surgeon: Loreli Slot, MD;  Location: [facility] OR;  Service: Open Heart Surgery;  Laterality: N/A;   HERNIA REPAIR     x2 one was for sure a hiatal hernia   ICD IMPLANT N/A 12/15/2022   Procedure: ICD IMPLANT;  Surgeon: Regan Lemming, MD;  Location: [facility] INVASIVE CV LAB;  Service: Cardiovascular;  Laterality: N/A;   IR ANGIO EXTRACRAN SEL COM CAROTID INNOMINATE UNI L MOD SED  11/13/2021   IR CT HEAD LTD  11/13/2021   IR PERCUTANEOUS ART THROMBECTOMY/INFUSION INTRACRANIAL INC DIAG ANGIO  11/13/2021   IR THORACENTESIS ASP PLEURAL SPACE W/IMG GUIDE  03/14/2022   LAPAROSCOPIC GASTRIC SLEEVE RESECTION  2020   LAPAROSCOPIC GASTRIC SLEEVE RESECTION WITH HIATAL HERNIA REPAIR  08/17/2015   Procedure: LAPAROSCOPIC GASTRIC SLEEVE RESECTION WITH HIATAL HERNIA REPAIR;  Surgeon: Luretha Murphy, MD;  Location: [facility] ORS;  Service: General;;   LEFT HEART CATH AND CORONARY ANGIOGRAPHY N/A 10/30/2022   Procedure: LEFT HEART CATH AND CORONARY ANGIOGRAPHY;  Surgeon: Iran Ouch, MD;  Location: [facility] INVASIVE CV LAB;  Service: Cardiovascular;  Laterality: N/A;   MAZE N/A 03/06/2022   Procedure: MAZE;  Surgeon: Loreli Slot, MD;  Location: [facility] OR;  Service: Open Heart Surgery;  Laterality: N/A;   RADIOLOGY WITH ANESTHESIA N/A 11/12/2021   Procedure: IR WITH ANESTHESIA;  Surgeon: Julieanne Cotton, MD;  Location: [facility] OR;  Service: Radiology;  Laterality: N/A;   TEE WITHOUT CARDIOVERSION N/A 08/11/2020   Procedure: TRANSESOPHAGEAL ECHOCARDIOGRAM (TEE);  Surgeon: Regan Lemming, MD;  Location: [facility] INVASIVE CV LAB;  Service: Cardiovascular;   Laterality: N/A;   TEE WITHOUT CARDIOVERSION N/A 11/08/2021   Procedure: TRANSESOPHAGEAL ECHOCARDIOGRAM (TEE);  Surgeon: Antonieta Iba, MD;  Location: [facility] ORS;  Service: Cardiovascular;  Laterality: N/A;   TEE WITHOUT CARDIOVERSION N/A 03/06/2022   Procedure: TRANSESOPHAGEAL ECHOCARDIOGRAM (TEE);  Surgeon: Loreli Slot, MD;  Location: [facility] OR;  Service: Open Heart Surgery;  Laterality: N/A;   TUBAL LIGATION     x 2   UPPER GI ENDOSCOPY  08/17/2015   Procedure: UPPER GI ENDOSCOPY;  Surgeon: Luretha Murphy, MD;  Location: [facility] ORS;  Service: General;;    Current Outpatient Medications  Medication Sig Dispense Refill   acetaminophen (TYLENOL) 500 MG tablet Take 500 mg by mouth every 6 (six) hours as needed for moderate pain or headache.     amiodarone (PACERONE) 200 MG tablet Take 1 tablet (200 mg total) by mouth 2 (two) times daily. 180 tablet 3   amitriptyline (ELAVIL) 25 MG tablet TAKE 1 TABLET BY MOUTH EVERYDAY AT BEDTIME 30 tablet 0   aspirin EC 81 MG tablet Take 1 tablet (81 mg total) by mouth daily. Swallow whole. 30 tablet 12   b complex vitamins capsule Take 1 capsule by mouth daily.     cholecalciferol (VITAMIN D3) 25 MCG (1000 UNIT) tablet Take 1,000 Units by mouth daily.     Fremanezumab-vfrm (AJOVY) 225 MG/1.5ML SOAJ Inject 225 mg into  the skin every 30 (thirty) days. 1.68 mL 11   furosemide (LASIX) 40 MG tablet Take 40 MG twice daily for three days then decrease to 40 MG once daily. 36 tablet 2   gabapentin (NEURONTIN) 100 MG capsule Take 100 mg by mouth at bedtime.     Omega-3 Fatty Acids (FISH OIL) 1000 MG CAPS Take 100 mg by mouth daily.     potassium chloride (KLOR-CON) 10 MEQ tablet Take 10 mEq by mouth daily.     pravastatin (PRAVACHOL) 20 MG tablet Take 20 mg by mouth daily.     STUDY - LIBREXIA-AF - apixaban 5 mg or placebo capsule (PI-Sethi) Take 1 capsule (5 mg total) by mouth 2 (two) times daily. Take at approximately the same time of day with or without food.  Please bring bottle back with you to every visit; do not discard bottle. For Investigational Use only. 280 capsule 0   STUDY - LIBREXIA-AF - [id number] (Milvexian) 100 mg or placebo tablet (PI-Sethi) Take 1 tablet by mouth 2 (two) times daily. Take at approximately the same time of day with or without food. Please bring bottle back with you to every visit; do not discard bottle. For Investigational Use only. 280 tablet 0   Rimegepant Sulfate (NURTEC) 75 MG TBDP Take 1 tablet (75 mg total) by mouth as needed (take 1 tab at migraine onset. (Do not take more than 1 tablet in 24 hour period)). (Patient not taking: Reported on 12/10/2023) 8 tablet 5   No current facility-administered medications for this visit.    Allergies:   Atorvastatin, Atarax [hydroxyzine], Codeine, and Hydrocodone   Social History:  The patient  reports that she quit smoking about 14 months ago. Her smoking use included cigarettes. She started smoking about 32 years ago. She has a 7.8 pack-year smoking history. She has been exposed to tobacco smoke. She has never used smokeless tobacco. She reports current alcohol use of about 1.0 - 2.0 standard drink of alcohol per week. She reports that she does not use drugs.   Family History:   family history includes Diabetes in her mother; Ehlers-Danlos syndrome in her daughter; Familial dysautonomia in her daughter; Heart failure in her mother; Hypertension in her mother.    Review of Systems: Review of Systems  HENT: Negative.    Respiratory: Negative.    Cardiovascular: Negative.   Gastrointestinal: Negative.   Musculoskeletal: Negative.   Neurological: Negative.   Psychiatric/Behavioral: Negative.    All other systems reviewed and are negative.  PHYSICAL EXAM: VS:  BP 100/70 (BP Location: Left Arm, Patient Position: Sitting, Cuff Size: Normal)   Pulse 70   Ht 5\' 4"  (1.626 m)   Wt 159 lb 8 oz (72.3 kg)   SpO2 96%   BMI 27.38 kg/m  , BMI Body mass index is 27.38  kg/m. Constitutional:  oriented to person, place, and time. No distress.  HENT:  Head: Grossly normal Eyes:  no discharge. No scleral icterus.  Neck: No JVD, no carotid bruits  Cardiovascular: Regular rate and rhythm, no murmurs appreciated Pulmonary/Chest: Clear to auscultation bilaterally, no wheezes or rails Abdominal: Soft.  no distension.  no tenderness.  Musculoskeletal: Normal range of motion Neurological:  normal muscle tone. Coordination normal. No atrophy Skin: Skin warm and dry Psychiatric: normal affect, pleasant  Recent Labs: 01/01/2023: TSH 2.072 01/02/2023: ALT 20; BUN 21; Creatinine, Ser 0.96; Hemoglobin 11.4; Magnesium 2.5; Platelets 283; Potassium 4.1; Sodium 141    Lipid Panel Lab Results  Component Value  Date   CHOL 153 10/29/2022   HDL 71 10/29/2022   LDLCALC 75 10/29/2022   TRIG 35 10/29/2022      Wt Readings from Last 3 Encounters:  12/10/23 159 lb 8 oz (72.3 kg)  12/03/23 164 lb 3.2 oz (74.5 kg)  11/26/23 160 lb (72.6 kg)     ASSESSMENT AND PLAN:  Atrial fibrillation, persistent Prior ablation x 2, several cardioversions Maintained on amiodarone 200 twice daily Unable to tolerate beta-blocker  Smoking Smoking cessation recommended  Morbid obesity History of gastric bypass surgery, over 100 pound weight loss, Weight stable  Pulmonary hypertension/diastolic CHF Reports worsening fluid retention in the past several weeks despite Lasix 40 twice daily Recommend she try torsemide 20 twice daily to start with potassium 10 twice daily If needed could take extra potassium and torsemide for total of 3 a day Salt and fluid restriction recommended BMP in several weeks time   Orders Placed This Encounter  Procedures   EKG 12-Lead     Signed, Dossie Arbour, M.D., Ph.D. 12/10/2023  [facility] [place], [place] [phone]

## 2023-12-10 ENCOUNTER — Ambulatory Visit: Attending: Cardiovascular Disease | Admitting: Cardiovascular Disease

## 2023-12-10 ENCOUNTER — Encounter: Payer: Self-pay | Admitting: Cardiovascular Disease

## 2023-12-10 ENCOUNTER — Encounter: Payer: Self-pay | Admitting: Adult Health

## 2023-12-10 VITALS — BP 100/70 | HR 70 | Ht 64.0 in | Wt 159.5 lb

## 2023-12-10 DIAGNOSIS — I10 Essential (primary) hypertension: Secondary | ICD-10-CM | POA: Diagnosis not present

## 2023-12-10 DIAGNOSIS — I4729 Other ventricular tachycardia: Secondary | ICD-10-CM

## 2023-12-10 DIAGNOSIS — I5032 Chronic diastolic (congestive) heart failure: Secondary | ICD-10-CM | POA: Diagnosis not present

## 2023-12-10 DIAGNOSIS — I422 Other hypertrophic cardiomyopathy: Secondary | ICD-10-CM

## 2023-12-10 DIAGNOSIS — E782 Mixed hyperlipidemia: Secondary | ICD-10-CM

## 2023-12-10 DIAGNOSIS — I4891 Unspecified atrial fibrillation: Secondary | ICD-10-CM

## 2023-12-10 DIAGNOSIS — I4819 Other persistent atrial fibrillation: Secondary | ICD-10-CM | POA: Diagnosis not present

## 2023-12-10 DIAGNOSIS — Z79899 Other long term (current) drug therapy: Secondary | ICD-10-CM

## 2023-12-10 MED ORDER — POTASSIUM CHLORIDE ER 10 MEQ PO TBCR: 10.0000 meq | EXTENDED_RELEASE_TABLET | Freq: Two times a day (BID) | ORAL | 3 refills | Status: DC

## 2023-12-10 MED ORDER — TORSEMIDE 20 MG PO TABS: 20.0000 mg | ORAL_TABLET | Freq: Two times a day (BID) | ORAL | 3 refills | Status: DC

## 2023-12-10 NOTE — Patient Instructions (Addendum)
 Medication Instructions:  Potassium 10 meq twice a day with extra as needed Torsemide 20 mg twice a day with extra as needed  If you need a refill on your cardiac medications before your next appointment, please call your pharmacy.   Lab work: Your provider would like for you to return in 2-3 weeks to have the following labs drawn: BMP.   Please go to [facility] [address] ([facility] [address], [place] [address] You do not need an appointment.  They are open from 8 am- 4:30 pm.  Lunch from 1:00 pm- 2:00 pm You will not need to be fasting.   Testing/Procedures: No new testing needed  Follow-Up: At [facility], you and your health needs are our priority.  As part of our continuing mission to provide you with exceptional heart care, we have created designated Provider Care Teams.  These Care Teams include your primary Cardiologist (physician) and Advanced Practice Providers (APPs -  Physician Assistants and Nurse Practitioners) who all work together to provide you with the care you need, when you need it.  You will need a follow up appointment in 6 months  Providers on your designated Care Team:   Nicolasa Ducking, NP Eula Listen, PA-C Cadence Fransico Michael, [place]  COVID-19 Vaccine Information can be found at: [email or web address] For questions related to vaccine distribution or appointments, please email [email or web address] or call [phone].

## 2023-12-11 ENCOUNTER — Ambulatory Visit
Admission: RE | Admit: 2023-12-11 | Discharge: 2023-12-11 | Disposition: A | Discharge status: Home / Self Care | Source: Ambulatory Visit | Attending: Adult Health | Admitting: Adult Health

## 2023-12-11 DIAGNOSIS — R2689 Other abnormalities of gait and mobility: Secondary | ICD-10-CM

## 2023-12-12 ENCOUNTER — Encounter: Payer: Self-pay | Admitting: Cardiovascular Disease

## 2023-12-13 ENCOUNTER — Encounter: Payer: Self-pay | Admitting: Adult Health

## 2023-12-16 ENCOUNTER — Other Ambulatory Visit: Payer: Self-pay | Admitting: Adult Health

## 2023-12-18 ENCOUNTER — Other Ambulatory Visit (HOSPITAL_COMMUNITY): Payer: Self-pay | Admitting: Pharmacist

## 2023-12-18 MED ORDER — STUDY - LIBREXIA-AF - APIXABAN 5 MG OR PLACEBO CAPSULE (PI-SETHI): 5.0000 mg | ORAL_CAPSULE | Freq: Two times a day (BID) | ORAL | 0 refills | Status: DC

## 2023-12-18 MED ORDER — STUDY - LIBREXIA-AF - JNJ-70033093 (MILVEXIAN) 100 MG OR PLACEBO (PI-SETHI): 1.0000 | ORAL_TABLET | Freq: Two times a day (BID) | ORAL | 0 refills | Status: DC

## 2023-12-21 ENCOUNTER — Ambulatory Visit (INDEPENDENT_AMBULATORY_CARE_PROVIDER_SITE_OTHER): Payer: 59

## 2023-12-21 DIAGNOSIS — I422 Other hypertrophic cardiomyopathy: Secondary | ICD-10-CM

## 2023-12-22 ENCOUNTER — Encounter: Payer: Self-pay | Admitting: Cardiovascular Disease

## 2023-12-22 DIAGNOSIS — Z79899 Other long term (current) drug therapy: Secondary | ICD-10-CM

## 2023-12-22 LAB — CUP PACEART REMOTE DEVICE CHECK
Battery Remaining Longevity: 109 mo
Battery Voltage: 2.99 V
Brady Statistic RV Percent Paced: 96.98 %
Date Time Interrogation Session: 20250403220210
HighPow Impedance: 48 Ohm
Implantable Lead Connection Status: 753985
Implantable Lead Connection Status: 753985
Implantable Lead Implant Date: 20240329
Implantable Lead Implant Date: 20240329
Implantable Lead Location: 753859
Implantable Lead Location: 753860
Implantable Lead Model: 5076
Implantable Pulse Generator Implant Date: 20240329
Lead Channel Impedance Value: 285 Ohm
Lead Channel Impedance Value: 399 Ohm
Lead Channel Impedance Value: 456 Ohm
Lead Channel Pacing Threshold Amplitude: 0.875 V
Lead Channel Pacing Threshold Amplitude: 1.25 V
Lead Channel Pacing Threshold Pulse Width: 0.4 ms
Lead Channel Pacing Threshold Pulse Width: 0.4 ms
Lead Channel Sensing Intrinsic Amplitude: 0.9 mV
Lead Channel Sensing Intrinsic Amplitude: 3.1 mV
Lead Channel Setting Pacing Amplitude: 1.75 V
Lead Channel Setting Pacing Amplitude: 2 V
Lead Channel Setting Pacing Pulse Width: 0.4 ms
Lead Channel Setting Sensing Sensitivity: 0.3 mV
Zone Setting Status: 755011
Zone Setting Status: 755011
Zone Setting Status: 755011

## 2023-12-25 ENCOUNTER — Emergency Department
Admission: EM | Admit: 2023-12-25 | Discharge: 2023-12-26 | Disposition: A | Discharge status: Home / Self Care | Attending: Emergency Medicine | Admitting: Emergency Medicine

## 2023-12-25 DIAGNOSIS — N390 Urinary tract infection, site not specified: Secondary | ICD-10-CM | POA: Insufficient documentation

## 2023-12-25 DIAGNOSIS — K5900 Constipation, unspecified: Secondary | ICD-10-CM | POA: Diagnosis not present

## 2023-12-25 DIAGNOSIS — F17200 Nicotine dependence, unspecified, uncomplicated: Secondary | ICD-10-CM | POA: Insufficient documentation

## 2023-12-25 DIAGNOSIS — I5030 Unspecified diastolic (congestive) heart failure: Secondary | ICD-10-CM | POA: Insufficient documentation

## 2023-12-25 DIAGNOSIS — Z8673 Personal history of transient ischemic attack (TIA), and cerebral infarction without residual deficits: Secondary | ICD-10-CM | POA: Diagnosis not present

## 2023-12-25 DIAGNOSIS — R1030 Lower abdominal pain, unspecified: Secondary | ICD-10-CM

## 2023-12-25 DIAGNOSIS — Z7982 Long term (current) use of aspirin: Secondary | ICD-10-CM | POA: Diagnosis not present

## 2023-12-25 LAB — CBC
HCT: 33.8 % — ABNORMAL LOW (ref 36.0–46.0)
Hemoglobin: 10.9 g/dL — ABNORMAL LOW (ref 12.0–15.0)
MCH: 28.6 pg (ref 26.0–34.0)
MCHC: 32.2 g/dL (ref 30.0–36.0)
MCV: 88.7 fL (ref 80.0–100.0)
Platelets: 339 10*3/uL (ref 150–400)
RBC: 3.81 MIL/uL — ABNORMAL LOW (ref 3.87–5.11)
RDW: 18.3 % — ABNORMAL HIGH (ref 11.5–15.5)
WBC: 7.1 10*3/uL (ref 4.0–10.5)
nRBC: 0 % (ref 0.0–0.2)

## 2023-12-25 LAB — COMPREHENSIVE METABOLIC PANEL WITH GFR
ALT: 24 U/L (ref 0–44)
AST: 34 U/L (ref 15–41)
Albumin: 3.5 g/dL (ref 3.5–5.0)
Alkaline Phosphatase: 124 U/L (ref 38–126)
Anion gap: 8 (ref 5–15)
BUN: 31 mg/dL — ABNORMAL HIGH (ref 6–20)
CO2: 29 mmol/L (ref 22–32)
Calcium: 9 mg/dL (ref 8.9–10.3)
Chloride: 101 mmol/L (ref 98–111)
Creatinine, Ser: 1.31 mg/dL — ABNORMAL HIGH (ref 0.44–1.00)
GFR, Estimated: 49 mL/min — ABNORMAL LOW (ref >60)
Glucose, Bld: 94 mg/dL (ref 70–99)
Potassium: 4.5 mmol/L (ref 3.5–5.1)
Sodium: 138 mmol/L (ref 135–145)
Total Bilirubin: 0.5 mg/dL (ref 0.0–1.2)
Total Protein: 7.3 g/dL (ref 6.5–8.1)

## 2023-12-25 LAB — LIPASE, BLOOD: Lipase: 34 U/L (ref 11–51)

## 2023-12-25 MED ORDER — SODIUM CHLORIDE 0.9 % IV BOLUS
500.0000 mL | Freq: Once | INTRAVENOUS | Status: AC
  Administered 2023-12-26: 500 mL via INTRAVENOUS

## 2023-12-25 MED ORDER — MORPHINE SULFATE (PF) 4 MG/ML IV SOLN
4.0000 mg | Freq: Once | INTRAVENOUS | Status: AC
  Administered 2023-12-26: 4 mg via INTRAVENOUS
  Filled 2023-12-25: qty 1

## 2023-12-25 MED ORDER — ONDANSETRON HCL 4 MG/2ML IJ SOLN
4.0000 mg | Freq: Once | INTRAMUSCULAR | Status: AC
  Administered 2023-12-26: 4 mg via INTRAVENOUS
  Filled 2023-12-25: qty 2

## 2023-12-25 NOTE — ED Triage Notes (Signed)
 Pt presents to the ED POV from home for lower abdominal pain. Pt reports having a cholecystectomy about 3 weeks ago. Pt states that this pain started about 2.5 days ago. Denies N/V/D. States that the pain worsens with movement.

## 2023-12-25 NOTE — ED Provider Notes (Signed)
 [facility] Provider Note    Event Date/Time   First MD Initiated Contact with Patient 12/25/23 2337     (approximate)   History   Abdominal Pain   HPI {Remember to add pertinent medical, surgical, social, and/or OB history to HPI:1} Hannah Zimmerman is a 53 y.o. female  ***       Past Medical History   Past Medical History:  Diagnosis Date  . (HFpEF) heart failure with preserved ejection fraction (HCC)    a. 07/2019 Echo: EF 60-65%, mod LVH. Sev dil LA, mildly dil RA. Mod elev PASP; b. 07/2020 TEE: EF 50-55%, no rwma, sev conc LVH. Nl RV fxn. Mod dil RA. Mild-mod MR, mod TR.  . 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 07/2020 Cardiac CT: Cor Ca2+ = 338 (99th %'ile).  . Complication of anesthesia 20 years ago   a. spinal with first c section went too high stopped breathing, low bp with 2nd c section, no further issues with anesthesia  . Heart murmur    a. 07/2019 Echo: no significant valvular dzs; b. 07/2020 TEE: Mild-mod MR, mod TR.  Marland Kitchen History of hiatal hernia   . Hypertrophic cardiomyopathy (HCC)    a. 07/2019 Echo: Mod LVH; b. 01/2020 cMRI: EF 67%, HCM w/o obstruction. Max wall thickness 19mm. Sev LAE w/ L->R atrial level shunt. Large area of LGE @ mid-ventricular level in area of max wall thickness; c. 07/2020 Cardiac CT: Asymm hypertrophy up to 19mm in mid inferoseptum consistent w/ known HCM.  Marland Kitchen Morbid obesity (HCC)    a. 09/2014 s/p gastric bypass.  . Persistent atrial fibrillation (HCC)    a. Dx 07/2019-->CHA2DS2VASc = 2 (diast CHF/Fem)-->Eliquis; b. 09/2018 s/p DCCV (150J (biphasic) x 1); c. 12/2019 RFCA/PVI; d. 07/2020 repeat RFCA/PVI.  Marland Kitchen Rash    both arms from old bed bug bites, healing  . Stroke (HCC)   . Tobacco abuse   . Typical atrial flutter (HCC)    a. 12/2019 s/p RFCA.     Active Problem List   Patient Active Problem List   Diagnosis Date Noted  . AICD discharge 01/01/2023  . Iron deficiency anemia  01/01/2023  . Hypertrophic cardiomyopathy (HCC) 11/03/2022  . NSVT (nonsustained ventricular tachycardia) (HCC) 11/03/2022  . Demand ischemia (HCC) 10/31/2022  . NSTEMI (non-ST elevated myocardial infarction) (HCC) 10/29/2022  . Atrial fibrillation, chronic (HCC) 10/29/2022  . HLD (hyperlipidemia) 10/29/2022  . Chronic diastolic CHF (congestive heart failure) (HCC) 10/29/2022  . Depression 10/29/2022  . Stroke (HCC) 10/29/2022  . S/P Maze operation for atrial fibrillation 03/06/2022  . Secondary hypercoagulable state (HCC) 11/30/2021  . Middle cerebral artery embolism, 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  . Acute ischemic stroke (HCC) 11/12/2021  . Atypical atrial flutter (HCC)   . Nonrheumatic tricuspid valve regurgitation   . Bronchitis with acute wheezing 07/31/2019  . Persistent atrial fibrillation (HCC) 07/30/2019  . Acute exacerbation of CHF (congestive heart failure) (HCC) 07/30/2019  . Bronchitis 07/30/2019  . Atrial fibrillation with rapid ventricular response (HCC)   . 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 Tachycardia 06/05/2011  . SOB (shortness of breath) 06/05/2011  . Pulmonary HTN (HCC) 06/05/2011  . Smoking 06/05/2011     Past Surgical History   Past Surgical History:  Procedure Laterality Date  . ABDOMINAL HYSTERECTOMY    . ANKLE SURGERY     left x 2  . ATRIAL FIBRILLATION ABLATION N/A 12/24/2019   Procedure: ATRIAL FIBRILLATION  ABLATION;  Surgeon: Regan Lemming, MD;  Location: [facility] INVASIVE CV LAB;  Service: Cardiovascular;  Laterality: N/A;  . ATRIAL FIBRILLATION ABLATION N/A 08/11/2020   Procedure: ATRIAL FIBRILLATION ABLATION;  Surgeon: Regan Lemming, MD;  Location: [facility] INVASIVE CV LAB;  Service: Cardiovascular;  Laterality: N/A;  . CARDIOVERSION N/A 09/24/2019   Procedure: CARDIOVERSION;  Surgeon: Antonieta Iba, MD;  Location: [facility] ORS;  Service: Cardiovascular;  Laterality: N/A;  . CARDIOVERSION N/A 10/17/2019   Procedure:  CARDIOVERSION;  Surgeon: Antonieta Iba, MD;  Location: [facility] ORS;  Service: Cardiovascular;  Laterality: N/A;  . CARDIOVERSION N/A 11/08/2021   Procedure: CARDIOVERSION;  Surgeon: Antonieta Iba, MD;  Location: [facility] ORS;  Service: Cardiovascular;  Laterality: N/A;  . CESAREAN SECTION     x 3  . CLIPPING OF ATRIAL APPENDAGE N/A 03/06/2022   Procedure: CLIPPING OF ATRIAL APPENDAGE USING ATRICURE  CLIP SIZE ;  Surgeon: Loreli Slot, MD;  Location: [facility] OR;  Service: Open Heart Surgery;  Laterality: N/A;  . EXCISION OF ATRIAL MYXOMA N/A 03/06/2022   Procedure: RESECTION OF AORTIC VALVE TUMOR;  Surgeon: Loreli Slot, MD;  Location: [facility] OR;  Service: Open Heart Surgery;  Laterality: N/A;  . HERNIA REPAIR     x2 one was for sure a hiatal hernia  . ICD IMPLANT N/A 12/15/2022   Procedure: ICD IMPLANT;  Surgeon: Regan Lemming, MD;  Location: [facility] INVASIVE CV LAB;  Service: Cardiovascular;  Laterality: N/A;  . IR ANGIO EXTRACRAN SEL COM CAROTID INNOMINATE UNI L MOD SED  11/13/2021  . IR CT HEAD LTD  11/13/2021  . IR PERCUTANEOUS ART THROMBECTOMY/INFUSION INTRACRANIAL INC DIAG ANGIO  11/13/2021  . IR THORACENTESIS ASP PLEURAL SPACE W/IMG GUIDE  03/14/2022  . LAPAROSCOPIC GASTRIC SLEEVE RESECTION  2020  . LAPAROSCOPIC GASTRIC SLEEVE RESECTION WITH HIATAL HERNIA REPAIR  08/17/2015   Procedure: LAPAROSCOPIC GASTRIC SLEEVE RESECTION WITH HIATAL HERNIA REPAIR;  Surgeon: Luretha Murphy, MD;  Location: [facility] ORS;  Service: General;;  . LEFT HEART CATH AND CORONARY ANGIOGRAPHY N/A 10/30/2022   Procedure: LEFT HEART CATH AND CORONARY ANGIOGRAPHY;  Surgeon: Iran Ouch, MD;  Location: [facility] INVASIVE CV LAB;  Service: Cardiovascular;  Laterality: N/A;  . MAZE N/A 03/06/2022   Procedure: MAZE;  Surgeon: Loreli Slot, MD;  Location: [facility] OR;  Service: Open Heart Surgery;  Laterality: N/A;  . RADIOLOGY WITH ANESTHESIA N/A 11/12/2021   Procedure: IR WITH ANESTHESIA;  Surgeon:  Julieanne Cotton, MD;  Location: [facility] OR;  Service: Radiology;  Laterality: N/A;  . TEE WITHOUT CARDIOVERSION N/A 08/11/2020   Procedure: TRANSESOPHAGEAL ECHOCARDIOGRAM (TEE);  Surgeon: Regan Lemming, MD;  Location: [facility] INVASIVE CV LAB;  Service: Cardiovascular;  Laterality: N/A;  . TEE WITHOUT CARDIOVERSION N/A 11/08/2021   Procedure: TRANSESOPHAGEAL ECHOCARDIOGRAM (TEE);  Surgeon: Antonieta Iba, MD;  Location: [facility] ORS;  Service: Cardiovascular;  Laterality: N/A;  . TEE WITHOUT CARDIOVERSION N/A 03/06/2022   Procedure: TRANSESOPHAGEAL ECHOCARDIOGRAM (TEE);  Surgeon: Loreli Slot, MD;  Location: [facility] OR;  Service: Open Heart Surgery;  Laterality: N/A;  . TUBAL LIGATION     x 2  . UPPER GI ENDOSCOPY  08/17/2015   Procedure: UPPER GI ENDOSCOPY;  Surgeon: Luretha Murphy, MD;  Location: [facility] ORS;  Service: General;;     Home Medications   Prior to Admission medications   Medication Sig Start Date End Date Taking? Authorizing Provider  acetaminophen (TYLENOL) 500 MG tablet Take 500 mg by mouth every 6 (six) hours  as needed for moderate pain or headache.    [provider]  amiodarone (PACERONE) 200 MG tablet Take 1 tablet (200 mg total) by mouth 2 (two) times daily. 03/14/23   Camnitz, Will Daphine Deutscher, MD  amitriptyline (ELAVIL) 25 MG tablet TAKE 1 TABLET BY MOUTH EVERYDAY AT BEDTIME 12/17/23   Ihor Austin, NP  aspirin EC 81 MG tablet Take 1 tablet (81 mg total) by mouth daily. Swallow whole. 11/01/22   Willeen Niece, MD  b complex vitamins capsule Take 1 capsule by mouth daily.    [provider]  cholecalciferol (VITAMIN D3) 25 MCG (1000 UNIT) tablet Take 1,000 Units by mouth daily.    [provider]  Fremanezumab-vfrm (AJOVY) 225 MG/1.5ML SOAJ Inject 225 mg into the skin every 30 (thirty) days. 12/03/23   Ihor Austin, NP  gabapentin (NEURONTIN) 100 MG capsule Take 100 mg by mouth at bedtime.    [provider]  Omega-3 Fatty Acids (FISH  OIL) 1000 MG CAPS Take 100 mg by mouth daily.    [provider]  potassium chloride (KLOR-CON) 10 MEQ tablet Take 1 tablet (10 mEq total) by mouth 2 (two) times daily. May take an additional tablet ( 10 mEq) daily as needed. 12/10/23   Antonieta Iba, MD  pravastatin (PRAVACHOL) 20 MG tablet Take 20 mg by mouth daily.    [provider]  Rimegepant Sulfate (NURTEC) 75 MG TBDP Take 1 tablet (75 mg total) by mouth as needed (take 1 tab at migraine onset. (Do not take more than 1 tablet in 24 hour period)). Patient not taking: Reported on 12/10/2023 12/03/23   Ihor Austin, NP  STUDY - LIBREXIA-AF - apixaban 5 mg or placebo capsule (PI-Sethi) Take 1 capsule (5 mg total) by mouth 2 (two) times daily. Take at approximately the same time of day with or without food. Please bring bottle back with you to every visit; do not discard bottle. For Investigational Use only. 12/18/23   Micki Riley, MD  STUDY Lois Huxley - [id number] (Milvexian) 100 mg or placebo tablet (PI-Sethi) Take 1 tablet by mouth 2 (two) times daily. Take at approximately the same time of day with or without food. Please bring bottle back with you to every visit; do not discard bottle. For Investigational Use only. 12/18/23   Micki Riley, MD  torsemide (DEMADEX) 20 MG tablet Take 1 tablet (20 mg total) by mouth 2 (two) times daily. May take an additional tablet ( 20 MG) daily as needed for swelling. 12/10/23   Gollan, Tollie Pizza, MD     Allergies  Atorvastatin, Atarax [hydroxyzine], Codeine, and Hydrocodone   Family History   Family History  Problem Relation Age of Onset  . Hypertension Mother   . Diabetes Mother   . Heart failure Mother   . Ehlers-Danlos syndrome Daughter   . Familial dysautonomia Daughter   . Breast cancer Neg Hx      Physical Exam  Triage Vital Signs: ED Triage Vitals  Encounter Vitals Group     BP 12/25/23 1644 111/71     Systolic BP Percentile --      Diastolic BP Percentile  --      Pulse Rate 12/25/23 1644 74     Resp 12/25/23 1644 18     Temp 12/25/23 1644 98.1 F (36.7 C)     Temp Source 12/25/23 1644 Oral     SpO2 12/25/23 1644 100 %     Weight 12/25/23 1646 145 lb (65.8  kg)     Height 12/25/23 1646 5\' 4"  (1.626 m)     Head Circumference --      Peak Flow --      Pain Score 12/25/23 1645 7     Pain Loc --      Pain Education --      Exclude from Growth Chart --     Updated Vital Signs: BP 111/71   Pulse 74   Temp 98.1 F (36.7 C) (Oral)   Resp 18   Ht 5\' 4"  (1.626 m)   Wt 65.8 kg   SpO2 100%   BMI 24.89 kg/m   {Only need to document appropriate and relevant physical exam:1} General: Awake, no distress. *** CV:  Good peripheral perfusion. *** Resp:  Normal effort. *** Abd:  No distention. *** Other:  ***   ED Results / Procedures / Treatments  Labs (all labs ordered are listed, but only abnormal results are displayed) Labs Reviewed  COMPREHENSIVE METABOLIC PANEL WITH GFR - Abnormal; Notable for the following components:      Result Value   BUN 31 (*)    Creatinine, Ser 1.31 (*)    GFR, Estimated 49 (*)    All other components within normal limits  CBC - Abnormal; Notable for the following components:   RBC 3.81 (*)    Hemoglobin 10.9 (*)    HCT 33.8 (*)    RDW 18.3 (*)    All other components within normal limits  LIPASE, BLOOD  URINALYSIS, ROUTINE W REFLEX MICROSCOPIC     EKG  ***   RADIOLOGY *** {You MUST document your own interpretation of imaging, as well as the fact that you reviewed the radiologist's report!:1}  Official radiology report(s): No results found.   PROCEDURES:  Critical Care performed: {CriticalCareYesNo:19197::"Yes, see critical care procedure note(s)","No"}  Procedures   MEDICATIONS ORDERED IN ED: Medications - No data to display   IMPRESSION / MDM / ASSESSMENT AND PLAN / ED COURSE  I reviewed the triage vital signs and the nursing notes.                               Differential diagnosis includes, but is not limited to, ***  Patient's presentation is most consistent with {EM COPA:27473}  {If the patient is on the monitor, remove the brackets and asterisks on the sentence below and remember to document it as a Procedure as well. Otherwise delete the sentence below:1} {**The patient is on the cardiac monitor to evaluate for evidence of arrhythmia and/or significant heart rate changes.**}  {Remember to include, when applicable, any/all of the following data: independent review of imaging independent review of labs (comment specifically on pertinent positives and negatives) review of specific prior hospitalizations, PCP/specialist notes, etc. discuss meds given and prescribed document any discussion with consultants (including hospitalists) any clinical decision tools you used and why (PECARN, NEXUS, etc.) did you consider admitting the patient? document social determinants of health affecting patient's care (homelessness, inability to follow up in a timely fashion, etc) document any pre-existing conditions increasing risk on current visit (e.g. diabetes and HTN increasing danger of high-risk chest pain/ACS) describes what meds you gave (especially parenteral) and why any other interventions?:1}      FINAL CLINICAL IMPRESSION(S) / ED DIAGNOSES   Final diagnoses:  None     Rx / DC Orders   ED Discharge Orders     None  Note:  This document was prepared using Dragon voice recognition software and may include unintentional dictation errors.

## 2023-12-26 ENCOUNTER — Emergency Department

## 2023-12-26 LAB — URINALYSIS, ROUTINE W REFLEX MICROSCOPIC
Bilirubin Urine: NEGATIVE
Glucose, UA: NEGATIVE mg/dL
Hgb urine dipstick: NEGATIVE
Ketones, ur: NEGATIVE mg/dL
Nitrite: NEGATIVE
Protein, ur: NEGATIVE mg/dL
Specific Gravity, Urine: 1.014 (ref 1.005–1.030)
pH: 6 (ref 5.0–8.0)

## 2023-12-26 MED ORDER — LACTULOSE 10 GM/15ML PO SOLN: 20.0000 g | Freq: Every day | ORAL | 0 refills | Status: DC | PRN

## 2023-12-26 MED ORDER — IOHEXOL 300 MG/ML  SOLN
100.0000 mL | Freq: Once | INTRAMUSCULAR | Status: AC | PRN
  Administered 2023-12-26: 100 mL via INTRAVENOUS

## 2023-12-26 MED ORDER — DICYCLOMINE HCL 20 MG PO TABS: 20.0000 mg | ORAL_TABLET | Freq: Four times a day (QID) | ORAL | 0 refills | Status: DC

## 2023-12-26 MED ORDER — FOSFOMYCIN TROMETHAMINE 3 G PO PACK
3.0000 g | PACK | Freq: Once | ORAL | Status: AC
Start: 2023-12-26 — End: 2023-12-26
  Administered 2023-12-26: 3 g via ORAL
  Filled 2023-12-26: qty 3

## 2023-12-26 NOTE — Discharge Instructions (Signed)
 You may take lactulose as needed for bowel movements.  Take Bentyl as needed for abdominal cramping.  Return to the ER for worsening symptoms, persistent vomiting, fever, difficulty breathing or other concerns.

## 2023-12-26 NOTE — Progress Notes (Unsigned)
 Electrophysiology Clinic Note    Date:  12/26/2023  Patient ID:  Hannah Zimmerman, DOB 1971-07-10, MRN [id number] PCP:  Hannah Corner, PA-C  Cardiologist:  Hannah Nordmann, MD Electrophysiologist: Hannah Lemming, MD  ***refresh  Discussed the use of AI scribe software for clinical note transcription with the patient, who gave verbal consent to proceed.   Patient Profile    Chief Complaint: ICD follow-up  History of Present Illness: Hannah Zimmerman is a 53 y.o. female with PMH notable for HCM, persis AFib, VT, CVA; seen today for Hannah Jorja Loa, MD for routine electrophysiology followup.   She is s/p AF ablation x 2, s/p surgical MAZE with left atrial appendage clipping.  She last saw Dr. Elberta Zimmerman 03/2023 where she was doing well.   On follow-up today, *** AF burden, symptoms *** palpitations *** bleeding concerns, on study w neurology??  - needs updated thyroid labs     Since last being seen in our clinic the patient reports doing ***.  she denies chest pain, palpitations, dyspnea, PND, orthopnea, nausea, vomiting, dizziness, syncope, edema, weight gain, or early satiety.      Arrhythmia/Device History MDT dual chamber PPM, imp 11/2022; dx HCM  + appropriate therapy  AAD Amiodarone     ROS:  Please see the history of present illness. All other systems are reviewed and otherwise negative.    Physical Exam    VS:  There were no vitals taken for this visit. BMI: There is no height or weight on file to calculate BMI.  Wt Readings from Last 3 Encounters:  12/25/23 145 lb (65.8 kg)  12/10/23 159 lb 8 oz (72.3 kg)  12/03/23 164 lb 3.2 oz (74.5 kg)     GEN- The patient is well appearing, alert and oriented x 3 today.   Lungs- Clear to ausculation bilaterally, normal work of breathing.  Heart- {Blank single:19197::"Regular","Irregularly irregular"} rate and rhythm, no murmurs, rubs or gallops Extremities- {EDEMA LEVEL:28147::"No"} peripheral  edema, warm, dry Skin-  *** device pocket well-healed, no tethering   Device interrogation done today and reviewed by myself:  Battery *** Lead thresholds, impedence, sensing stable *** *** episodes *** changes made today   Studies Reviewed   Previous EP, cardiology notes.    EKG is not ordered. Personal review of EKG from  12/10/2023  shows:  AV paced at 70bpm        Long term monitor, 11/30/2022 Predominant rhythm was sinus rhythm 2 VT episodes, longest 8 beats 69 SVT episodes, longest 12 seconds 585 pauses, longest 8.2 seconds Multiple episodes of junctional rhythm Pauses/junctional rhythm associated with triggered episodes 1.4% supraventricular ectopy 3.2% ventricular ectopy  TTE, 10/30/2022  1. Left ventricular ejection fraction, by estimation, is 65 to 70%. The left ventricle has normal function. The left ventricle has no regional wall motion abnormalities. There is moderate asymmetric left ventricular hypertrophy of the septal segment. Left ventricular diastolic parameters are indeterminate. No significant LVOT gradient even with Valsalva.   2. Right ventricular systolic function is normal. The right ventricular size is normal. There is mildly elevated pulmonary artery systolic pressure.   3. Left atrial size was mildly dilated.   4. Right atrial size was mildly dilated.   5. The mitral valve is normal in structure. Mild mitral valve regurgitation. No evidence of mitral stenosis.   6. Tricuspid valve regurgitation is moderate.   7. The aortic valve is normal in structure. Aortic valve regurgitation is not visualized. No aortic stenosis  is present.   8. The inferior vena cava is normal in size with <50% respiratory variability, suggesting right atrial pressure of 8 mmHg.      Assessment and Plan     #) VT #) NSVT #) HCM #) ICD in situ    #) persis AFib    #) Hypercoag d/t persis afib CHA2DS2-VASc Score = at least 4 [CHF History: 1, HTN History: 0, Diabetes  History: 0, Stroke History: 2, Vascular Disease History: 0, Age Score: 0, Gender Score: 1].  Therefore, the patient's annual risk of stroke is 4.8 %.     {Confirm score is correct.  If not, click here to update score.  REFRESH note.  :1}   Stroke ppx - ***, appropriately dosed No bleeding concerns    #) ***   {Are you ordering a CV Procedure (e.g. stress test, cath, DCCV, TEE, etc)?   Press F2        :[id number]}   Current medicines are reviewed at length with the patient today.   The patient {ACTIONS; HAS/DOES NOT HAVE:19233} concerns regarding her medicines.  The following changes were made today:  {NONE DEFAULTED:18576}  Labs/ tests ordered today include: *** No orders of the defined types were placed in this encounter.    Disposition: Follow up with {EPMDS:28135} or EP APP {EPFOLLOW UP:28173}   Signed, Hannah Don, NP  12/26/23  4:22 PM  Electrophysiology [facility]

## 2023-12-27 ENCOUNTER — Ambulatory Visit: Attending: Cardiology | Admitting: Cardiology

## 2023-12-27 ENCOUNTER — Encounter: Payer: Self-pay | Admitting: Cardiology

## 2023-12-27 VITALS — BP 90/60 | HR 69 | Ht 64.0 in | Wt 146.4 lb

## 2023-12-27 DIAGNOSIS — Z5181 Encounter for therapeutic drug level monitoring: Secondary | ICD-10-CM

## 2023-12-27 DIAGNOSIS — I422 Other hypertrophic cardiomyopathy: Secondary | ICD-10-CM

## 2023-12-27 DIAGNOSIS — Z95810 Presence of automatic (implantable) cardiac defibrillator: Secondary | ICD-10-CM | POA: Diagnosis not present

## 2023-12-27 DIAGNOSIS — I4819 Other persistent atrial fibrillation: Secondary | ICD-10-CM | POA: Diagnosis not present

## 2023-12-27 DIAGNOSIS — I4729 Other ventricular tachycardia: Secondary | ICD-10-CM | POA: Diagnosis not present

## 2023-12-27 DIAGNOSIS — I472 Ventricular tachycardia, unspecified: Secondary | ICD-10-CM

## 2023-12-27 DIAGNOSIS — Z79899 Other long term (current) drug therapy: Secondary | ICD-10-CM

## 2023-12-27 LAB — CUP PACEART INCLINIC DEVICE CHECK
Date Time Interrogation Session: 20250410130236
Implantable Lead Connection Status: 753985
Implantable Lead Connection Status: 753985
Implantable Lead Implant Date: 20240329
Implantable Lead Implant Date: 20240329
Implantable Lead Location: 753859
Implantable Lead Location: 753860
Implantable Lead Model: 5076
Implantable Pulse Generator Implant Date: 20240329

## 2023-12-27 NOTE — Patient Instructions (Signed)
 Medication Instructions:   Your Physician recommend you continue on your current medication as directed.     *If you need a refill on your cardiac medications before your next appointment, please call your pharmacy*  Lab Work:  Your provider would like for you to have following labs drawn today TSH andT4.    If you have labs (blood work) drawn today and your tests are completely normal, you will receive your results only by: MyChart Message (if you have MyChart) OR A paper copy in the mail If you have any lab test that is abnormal or we need to change your treatment, we will call you to review the results.  Testing/Procedures: None ordered.  Follow-Up: At [facility], you and your health needs are our priority.  As part of our continuing mission to provide you with exceptional heart care, our providers are all part of one team.  This team includes your primary Cardiologist (physician) and Advanced Practice Providers or APPs (Physician Assistants and Nurse Practitioners) who all work together to provide you with the care you need, when you need it.  Your next appointment:   6 month(s)  Provider:   Sherie Don, NP    We recommend signing up for the patient portal called "MyChart".  Sign up information is provided on this After Visit Summary.  MyChart is used to connect with patients for Virtual Visits (Telemedicine).  Patients are able to view lab/test results, encounter notes, upcoming appointments, etc.  Non-urgent messages can be sent to your provider as well.   To learn more about what you can do with MyChart, go to [email or web address].

## 2023-12-28 LAB — T4, FREE: Free T4: 1.17 ng/dL (ref 0.82–1.77)

## 2023-12-28 LAB — TSH: TSH: 3.39 u[IU]/mL (ref 0.450–4.500)

## 2023-12-31 MED ORDER — TORSEMIDE 20 MG PO TABS: ORAL_TABLET | ORAL | 3 refills | Status: DC

## 2023-12-31 NOTE — Addendum Note (Signed)
 Addended by: Elvia Hammans on: 12/31/2023 10:35 AM   Modules accepted: Orders

## 2023-12-31 NOTE — Telephone Encounter (Addendum)
 Called and spoke with patient. Notified her of the following from Dr. Gollan.  Seen by Lyle San April 10 at that time ICD download showing OptiVol was low indicating not much fluid retention  This was on torsemide 20 twice daily, Amalia Badder recommended 20 daily   I suspect she would likely need somewhere in middle, likely torsemide 20 daily alternating with 20 twice a day  Extra 20 as needed for worsening leg swelling  We did see some mild dehydration on renal function in April 8 so need to be careful not to overdo torsemide  How much torsemide daily will depend on her fluid and salt intake  Okay to repeat BMP in 1 month on a steady torsemide regimen  Thx  TGollan   Patient verbalized understanding. Prescription sent to preferred pharmacy and orders placed for BMP.

## 2024-01-10 ENCOUNTER — Encounter: Payer: Self-pay | Admitting: Optometry

## 2024-01-13 ENCOUNTER — Other Ambulatory Visit: Payer: Self-pay | Admitting: Adult Health

## 2024-01-14 NOTE — Telephone Encounter (Signed)
 Last seen on 12/03/23 per note " Continue amitriptyline  25 mg nightly -hesitant to increase further due to current use of amiodarone "  Follow up scheduled on 04/24/24

## 2024-01-28 NOTE — Progress Notes (Signed)
 Remote ICD transmission.

## 2024-01-28 NOTE — Addendum Note (Signed)
 Addended by: Lott Rouleau A on: 01/28/2024 12:26 PM   Modules accepted: Orders

## 2024-02-07 ENCOUNTER — Encounter: Payer: Self-pay | Admitting: Emergency Medicine

## 2024-02-27 LAB — BASIC METABOLIC PANEL WITH GFR
BUN/Creatinine Ratio: 14 (ref 9–23)
BUN: 21 mg/dL (ref 6–24)
CO2: 23 mmol/L (ref 20–29)
Calcium: 8.8 mg/dL (ref 8.7–10.2)
Chloride: 100 mmol/L (ref 96–106)
Creatinine, Ser: 1.45 mg/dL — ABNORMAL HIGH (ref 0.57–1.00)
Glucose: 107 mg/dL — ABNORMAL HIGH (ref 70–99)
Potassium: 4.2 mmol/L (ref 3.5–5.2)
Sodium: 140 mmol/L (ref 134–144)
eGFR: 43 mL/min/1.73 — ABNORMAL LOW

## 2024-02-28 ENCOUNTER — Other Ambulatory Visit: Payer: Self-pay | Admitting: Family Medicine

## 2024-02-28 DIAGNOSIS — R748 Abnormal levels of other serum enzymes: Secondary | ICD-10-CM

## 2024-02-28 DIAGNOSIS — R791 Abnormal coagulation profile: Secondary | ICD-10-CM

## 2024-02-29 ENCOUNTER — Ambulatory Visit
Admission: RE | Admit: 2024-02-29 | Discharge: 2024-02-29 | Disposition: A | Discharge status: Home / Self Care | Source: Ambulatory Visit | Attending: Family Medicine | Admitting: Family Medicine

## 2024-02-29 DIAGNOSIS — R791 Abnormal coagulation profile: Secondary | ICD-10-CM | POA: Insufficient documentation

## 2024-02-29 DIAGNOSIS — R748 Abnormal levels of other serum enzymes: Secondary | ICD-10-CM | POA: Insufficient documentation

## 2024-03-02 ENCOUNTER — Ambulatory Visit: Payer: Self-pay | Admitting: Cardiovascular Disease

## 2024-03-03 ENCOUNTER — Other Ambulatory Visit: Payer: Self-pay | Admitting: Emergency Medicine

## 2024-03-03 ENCOUNTER — Encounter: Payer: Self-pay | Admitting: Cardiovascular Disease

## 2024-03-03 MED ORDER — TORSEMIDE 20 MG PO TABS: 20.0000 mg | ORAL_TABLET | Freq: Every day | ORAL | 3 refills | Status: DC

## 2024-03-14 ENCOUNTER — Encounter (HOSPITAL_COMMUNITY): Payer: Self-pay | Admitting: Interventional Radiology

## 2024-03-17 ENCOUNTER — Other Ambulatory Visit (HOSPITAL_COMMUNITY): Payer: Self-pay | Admitting: Pharmacist

## 2024-03-17 MED ORDER — STUDY - LIBREXIA-AF - APIXABAN 5 MG OR PLACEBO CAPSULE (PI-SETHI): 5.0000 mg | ORAL_CAPSULE | Freq: Two times a day (BID) | ORAL | 0 refills | Status: DC

## 2024-03-17 MED ORDER — STUDY - LIBREXIA-AF - JNJ-70033093 (MILVEXIAN) 100 MG OR PLACEBO (PI-SETHI): 1.0000 | ORAL_TABLET | Freq: Two times a day (BID) | ORAL | 0 refills | Status: DC

## 2024-03-18 ENCOUNTER — Other Ambulatory Visit: Payer: Self-pay | Admitting: Gastroenterology

## 2024-03-18 DIAGNOSIS — R7989 Other specified abnormal findings of blood chemistry: Secondary | ICD-10-CM

## 2024-03-20 ENCOUNTER — Encounter: Payer: Self-pay | Admitting: Cardiovascular Disease

## 2024-03-20 ENCOUNTER — Ambulatory Visit
Admission: RE | Admit: 2024-03-20 | Discharge: 2024-03-20 | Disposition: A | Discharge status: Home / Self Care | Source: Ambulatory Visit | Attending: Gastroenterology | Admitting: Gastroenterology

## 2024-03-20 ENCOUNTER — Ambulatory Visit: Payer: 59 | Attending: Family Medicine

## 2024-03-20 DIAGNOSIS — R7989 Other specified abnormal findings of blood chemistry: Secondary | ICD-10-CM

## 2024-03-20 DIAGNOSIS — I422 Other hypertrophic cardiomyopathy: Secondary | ICD-10-CM

## 2024-03-20 LAB — CUP PACEART REMOTE DEVICE CHECK
Battery Remaining Longevity: 108 mo
Battery Voltage: 2.99 V
Brady Statistic AP VP Percent: 96.49 %
Brady Statistic AP VS Percent: 3.46 %
Brady Statistic AS VP Percent: 0.01 %
Brady Statistic AS VS Percent: 0.04 %
Brady Statistic RA Percent Paced: 99.98 %
Brady Statistic RV Percent Paced: 96.5 %
Date Time Interrogation Session: 20250702211513
HighPow Impedance: 62 Ohm
Implantable Lead Connection Status: 753985
Implantable Lead Connection Status: 753985
Implantable Lead Implant Date: 20240329
Implantable Lead Implant Date: 20240329
Implantable Lead Location: 753859
Implantable Lead Location: 753860
Implantable Lead Model: 5076
Implantable Pulse Generator Implant Date: 20240329
Lead Channel Impedance Value: 285 Ohm
Lead Channel Impedance Value: 399 Ohm
Lead Channel Impedance Value: 437 Ohm
Lead Channel Pacing Threshold Amplitude: 0.875 V
Lead Channel Pacing Threshold Amplitude: 1.125 V
Lead Channel Pacing Threshold Pulse Width: 0.4 ms
Lead Channel Pacing Threshold Pulse Width: 0.4 ms
Lead Channel Sensing Intrinsic Amplitude: 1 mV
Lead Channel Sensing Intrinsic Amplitude: 8.6 mV
Lead Channel Setting Pacing Amplitude: 1.75 V
Lead Channel Setting Pacing Amplitude: 1.75 V
Lead Channel Setting Pacing Pulse Width: 0.4 ms
Lead Channel Setting Sensing Sensitivity: 0.3 mV
Zone Setting Status: 755011
Zone Setting Status: 755011
Zone Setting Status: 755011

## 2024-03-24 ENCOUNTER — Ambulatory Visit: Payer: Self-pay | Admitting: Cardiology

## 2024-03-24 NOTE — Progress Notes (Unsigned)
 Cardiology Office Note    Date:  03/25/2024   ID:  Powell JONELLE Harari, DOB [birth year], MRN [id number]  PCP:  Cyrus Selinda Moose, PA-C  Cardiologist:  Evalene Lunger, MD  Electrophysiologist:  Soyla Gladis Norton, MD   Chief Complaint: Volume overload  History of Present Illness:   Hannah Zimmerman is a 53 y.o. 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  Echo in 07/2019 showed an EF of 60 to 65%, moderately increased LVH, normal RV systolic function and ventricular cavity size with no increase in RV wall thickness, severely dilated left atrium, mildly dilated right atrium, moderately elevated RVSP, and a rhythm of A-fib.  She was subsequently evaluated by EP and underwent A-fib ablation in 12/2019.  She underwent cardiac MRI in 01/2020 that showed an EF of 67% with findings suggestive of hypertrophic cardiomyopathy without obstruction and no findings to suggest cardiac amyloidosis.  In the setting of recurrent A-fib she underwent repeat A-fib ablation in 07/2021.  Echo in 10/2021, performed in the setting of CVA, showed an EF of 60 to 65%, no regional wall motion abnormalities, severe concentric LVH, normal RV systolic function with moderately enlarged ventricular cavity size, moderately dilated right atrium, severely dilated left atrium, mild mitral regurgitation soft tissue density on the noncoronary cusp extending into the LVOT felt to likely represent a fibroblastoma or valvular myxoma.  She subsequently underwent surgical resection of papillary fibroblastoma with Maze procedure and left atrial appendage clipping.    She  was admitted in 10/2022 with chest pain with mildly elevated troponin.  Echo in 10/2022 showed an EF of 65 to 70%, no regional wall motion abnormalities, moderate asymmetric LVH of the septal segment, no significant LVOT gradient, normal RV systolic function and ventricular cavity size, mildly elevated RVSP estimated at 38.9 mmHg, mild biatrial enlargement, mild mitral regurgitation, moderate tricuspid regurgitation, and aortic valve normal in structure.  LHC in 10/2022 showed mild nonobstructive CAD with mid to distal RCA stenosis of 20%.  Left ventricular tracing consistent with hypertrophic cardiomyopathy with mild resting unprovoked LVOT obstruction with a peak gradient of 26 mmHg.  At that time, she was not started on beta-blocker given history of junctional bradycardia as well as documented 2.4-second pauses on telemetry.  She was evaluated by EP with outpatient cardiac monitoring showing 2 episodes of NSVT lasting up to 8 beats, 69 episodes of SVT lasting up to 12 seconds, 585 pauses lasting up to 8.2 seconds, multiple episodes of junctional rhythm with pauses and junctional rhythm associated patient triggered events, 1.4% burden of supraventricular ectopy, and 3.2% burden of ventricular ectopy.  In this setting she underwent dual-chamber ICD implantation in 11/2022.  She was evaluated in 12/2023 for lower abdominal pain with CT of the abdomen/pelvis showing reflux of contrast into the hepatic venous system consistent with some degree of right heart failure and the passive congestion of the liver and hepatic changes felt to reflect congestive hepatopathy.  More recently, the patient had a liver ultrasound done in 02/2024 that showed a dilated IVC with recommendation for the patient to take torsemide  40 mg daily alternating with 20 mg daily.  She had follow-up labs drawn on 03/17/2024 that showed improving  AST/ALT and increasing alkaline phosphatase levels with recommendation to reach out to cardiology for  ongoing management of fluid overload.  (AST 164, ALT 96, alkaline phosphatase 250 on 02/26/2024 trending to AST 50, ALT 44, alkaline phosphatase 441). OptiVol on 03/20/2024 shows a slight fluid accumulation, though below threshold.  Right upper quadrant ultrasound in 03/2024 showed diffusely heterogeneous hepatic parenchyma indicating nonspecific hepatocellular dysfunction with no hepatic lesion identified.  She comes in today accompanied by her daughter and continues to feel like she is volume up, feeling like her abdomen is distended and with mild lower extremity swelling.  She is without symptoms of a frank chest pain.  No dizziness, presyncope, or syncope.  Her weight is up 5 pounds today when compared to her visit in our office in 12/2023.  She remains on torsemide  20 mg twice daily.  Also currently on amiodarone  200 mg daily for history of VT.   Labs independently reviewed: 02/2024 - potassium 4.1, BUN 21, serum creatinine 1.3, albumin  3.8, AST 50 (164), ALT 44 (96), alk phos 441 (250), Hgb 10.8, PLT 194, TC 130, TG 116, HDL 63, LDL 44, TSH 5.536, A1c 6.1  Past Medical History:  Diagnosis Date   (HFpEF) heart failure with preserved ejection fraction (HCC)    a. 07/2019 Echo: EF 60-65%, mod LVH. Sev dil LA, mildly dil RA. Mod elev PASP; b. 07/2020 TEE: EF 50-55%, no rwma, sev conc LVH. Nl RV fxn. Mod dil RA. Mild-mod MR, mod TR.   Agatston coronary artery calcium  score between 200 and 399    a. 07/2020 Cardiac CT: Cor Ca2+ = 338 (99th %'ile).   Complication of anesthesia 20 years ago   a. spinal with first c section went too high stopped breathing, low bp with 2nd c section, no further issues with anesthesia   Heart murmur    a. 07/2019 Echo: no significant valvular dzs; b. 07/2020 TEE: Mild-mod MR, mod TR.   History of hiatal hernia    Hypertrophic cardiomyopathy (HCC)    a. 07/2019 Echo: Mod LVH; b. 01/2020 cMRI: EF 67%, HCM w/o obstruction. Max wall thickness 19mm. Sev LAE w/ L->R atrial level  shunt. Large area of LGE @ mid-ventricular level in area of max wall thickness; c. 07/2020 Cardiac CT: Asymm hypertrophy up to 19mm in mid inferoseptum consistent w/ known HCM.   Morbid obesity (HCC)    a. 09/2014 s/p gastric bypass.   Persistent atrial fibrillation (HCC)    a. Dx 07/2019-->CHA2DS2VASc = 2 (diast CHF/Fem)-->Eliquis ; b. 09/2018 s/p DCCV (150J (biphasic) x 1); c. 12/2019 RFCA/PVI; d. 07/2020 repeat RFCA/PVI.   Rash    both arms from old bed bug bites, healing   Stroke [facility])    Tobacco abuse    Typical atrial flutter (HCC)    a. 12/2019 s/p RFCA.    Past Surgical History:  Procedure Laterality Date   ABDOMINAL HYSTERECTOMY     ANKLE SURGERY     left x 2   ATRIAL FIBRILLATION ABLATION N/A 12/24/2019   Procedure: ATRIAL FIBRILLATION ABLATION;  Surgeon: Inocencio Soyla Lunger, MD;  Location: [facility] INVASIVE CV LAB;  Service: Cardiovascular;  Laterality: N/A;   ATRIAL FIBRILLATION ABLATION N/A 08/11/2020   Procedure: ATRIAL FIBRILLATION ABLATION;  Surgeon: Inocencio Soyla Lunger, MD;  Location: [facility] IN[facility];  Service: Cardiovascular;  Laterality: N/A;   CARDIOVERSION N/A 09/24/2019   Procedure: CARDIOVERSION;  Surgeon: Perla Evalene PARAS, MD;  Location: [facility] ORS;  Service: Cardiovascular;  Laterality: N/A;   CARDIOVERSION N/A 10/17/2019  Procedure: CARDIOVERSION;  Surgeon: Perla Evalene PARAS, MD;  Location: [facility] ORS;  Service: Cardiovascular;  Laterality: N/A;   CARDIOVERSION N/A 11/08/2021   Procedure: CARDIOVERSION;  Surgeon: Perla Evalene PARAS, MD;  Location: [facility] ORS;  Service: Cardiovascular;  Laterality: N/A;   CESAREAN SECTION     x 3   CLIPPING OF ATRIAL APPENDAGE N/A 03/06/2022   Procedure: CLIPPING OF ATRIAL APPENDAGE USING ATRICURE  CLIP SIZE ;  Surgeon: Kerrin Elspeth BROCKS, MD;  Location[facility];  Service: Open Heart Surgery;  Laterality: N/A;   EXCISION OF ATRIAL MYXOMA N/A 03/06/2022   Procedure: RESECTION OF AORTIC VALVE TUMOR;  Surgeon: Kerrin Elspeth BROCKS,  MD;  Location: [facility] OR;  Service: Open Heart Surgery;  Laterality: N/A;   HERNIA REPAIR     x2 one was for sure a hiatal hernia   ICD IMPLANT N/A 12/15/2022   Procedure: ICD IMPLANT;  Surgeon: Inocencio Soyla Lunger, MD;  Location: [facility] INVASIVE CV LAB;  Service: Cardiovascular;  Laterality: N/A;   IR ANGIO EXTRACRAN SEL COM CAROTID INNOMINATE UNI L MOD SED  11/13/2021   IR CT HEAD LTD  11/13/2021   IR PERCUTANEOUS ART THROMBECTOMY/INFUSION INTRACRANIAL INC DIAG ANGIO  11/13/2021   IR THORACENTESIS ASP PLEURAL SPACE W/IMG GUIDE  03/14/2022   LAPAROSCOPIC GASTRIC SLEEVE RESECTION  2020   LAPAROSCOPIC GASTRIC SLEEVE RESECTION WITH HIATAL HERNIA REPAIR  08/17/2015   Procedure: LAPAROSCOPIC GASTRIC SLEEVE RESECTION WITH HIATAL HERNIA REPAIR;  Surgeon: Donnice Lunger, MD;  Location: [facility] ORS;  Service: General;;   LEFT HEART CATH AND CORONARY ANGIOGRAPHY N/A 10/30/2022   Procedure: LEFT HEART CATH AND CORONARY ANGIOGRAPHY;  Surgeon: Darron Deatrice LABOR, MD;  Location: [facility] INVASIVE CV LAB;  Service: Cardiovascular;  Laterality: N/A;   MAZE N/A 03/06/2022   Procedure: MAZE;  Surgeon: Kerrin Elspeth BROCKS, MD;  Location: [facility] OR;  Service: Open Heart Surgery;  Laterality: N/A;   RADIOLOGY WITH ANESTHESIA N/A 11/12/2021   Procedure: IR WITH ANESTHESIA;  Surgeon: Dolphus Carrion, MD;  Location: [facility] OR;  Service: Radiology;  Laterality: N/A;   TEE WITHOUT CARDIOVERSION N/A 08/11/2020   Procedure: TRANSESOPHAGEAL ECHOCARDIOGRAM (TEE);  Surgeon: Inocencio Soyla Lunger, MD;  Location: [facility] INVASIVE CV LAB;  Service: Cardiovascular;  Laterality: N/A;   TEE WITHOUT CARDIOVERSION N/A 11/08/2021   Procedure: TRANSESOPHAGEAL ECHOCARDIOGRAM (TEE);  Surgeon: Perla Evalene PARAS, MD;  Location: [facility] ORS;  Service: Cardiovascular;  Laterality: N/A;   TEE WITHOUT CARDIOVERSION N/A 03/06/2022   Procedure: TRANSESOPHAGEAL ECHOCARDIOGRAM (TEE);  Surgeon: Kerrin Elspeth BROCKS, MD;  Location: [facility] OR;  Service: Open Heart Surgery;   Laterality: N/A;   TUBAL LIGATION     x 2   UPPER GI ENDOSCOPY  08/17/2015   Procedure: UPPER GI ENDOSCOPY;  Surgeon: Donnice Lunger, MD;  Location: [facility] ORS;  Service: General;;    Current Medications: Current Meds  Medication Sig   amiodarone  (PACERONE ) 200 MG tablet Take 1 tablet (200 mg total) by mouth 2 (two) times daily.   amitriptyline  (ELAVIL ) 25 MG tablet TAKE 1 TABLET BY MOUTH EVERYDAY AT BEDTIME   aspirin  EC 81 MG tablet Take 1 tablet (81 mg total) by mouth daily. Swallow whole.   b complex vitamins capsule Take 1 capsule by mouth daily.   busPIRone (BUSPAR) 5 MG tablet Take 5 mg by mouth 2 (two) times daily.   cholecalciferol  (VITAMIN D3) 25 MCG (1000 UNIT) tablet Take 1,000 Units by mouth daily.   Fremanezumab -vfrm (AJOVY ) 225 MG/1.5ML SOAJ Inject 225 mg into the skin every  30 (thirty) days.   gabapentin (NEURONTIN) 100 MG capsule Take 100 mg by mouth at bedtime.   Omega-3 Fatty Acids (FISH OIL) 1000 MG CAPS Take 100 mg by mouth daily.   pravastatin  (PRAVACHOL ) 20 MG tablet Take 20 mg by mouth daily.   spironolactone  (ALDACTONE ) 25 MG tablet Take 0.5 tablets (12.5 mg total) by mouth daily.   STUDY - LIBREXIA-AF - apixaban  5 mg or placebo capsule (PI-Sethi) Take 1 capsule (5 mg total) by mouth 2 (two) times daily. Take at approximately the same time of day with or without food. Please bring bottle back with you to every visit; do not discard bottle. For Investigational Use only.   STUDY - LIBREXIA-AF - [id number] (Milvexian) 100 mg or placebo tablet (PI-Sethi) Take 1 tablet by mouth 2 (two) times daily. Take at approximately the same time of day with or without food. Please bring bottle back with you to every visit; do not discard bottle. For Investigational Use only.   torsemide  (DEMADEX ) 20 MG tablet Take 1 tablet (20 mg total) by mouth daily.   [DISCONTINUED] potassium chloride  (KLOR-CON ) 10 MEQ tablet Take 1 tablet (10 mEq total) by mouth 2 (two) times daily. May take an  additional tablet ( 10 mEq) daily as needed.    Allergies:   Atorvastatin , Atarax [hydroxyzine], Codeine, and Hydrocodone   Social History   Socioeconomic History   Marital status: Significant Other    Spouse name: Not on file   Number of children: 3   Years of education: two years of college   Highest education level: Not on file  Occupational History   Occupation: office assistant  Tobacco Use   Smoking status: Former    Current packs/day: 0.00    Average packs/day: 0.3 packs/day for 31.0 years (7.8 ttl pk-yrs)    Types: Cigarettes    Start date: 10/04/1991    Quit date: 10/03/2022    Years since quitting: 1.4    Passive exposure: Current   Smokeless tobacco: Never  Vaping Use   Vaping status: Never Used  Substance and Sexual Activity   Alcohol use: Yes    Alcohol/week: 1.0 - 2.0 standard drink of alcohol    Types: 1 - 2 Standard drinks or equivalent per week    Comment: Occaional use - 1-2 mixed drinks prior to stroke since d/c nothing 11/30/21   Drug use: No   Sexual activity: Yes  Other Topics Concern   Not on file  Social History Narrative   Lives at home with significant other.   Some caffeine daily I her green tea.   Right-handed.   Social Drivers of Corporate investment banker Strain: Low Risk  (03/17/2024)   Received from [facility]   Overall Financial Resource Strain (CARDIA)    Difficulty of Paying Living Expenses: Not very hard  Food Insecurity: Food Insecurity Present (03/17/2024)   Received from [facility]   Hunger Vital Sign    Within the past 12 months, you worried that your food would run out before you got the money to buy more.: Sometimes true    Within the past 12 months, the food you bought just didn't last and you didn't have money to get more.: Never true  Transportation Needs: No Transportation Needs (03/17/2024)   Received from [facility] - Transportation    In the past 12  months, has lack of transportation kept you from medical appointments or from getting  medications?: No    Lack of Transportation (Non-Medical): No  Physical Activity: Insufficiently Active (03/05/2023)   Received from [facility]   Exercise Vital Sign    On average, how many days per week do you engage in moderate to strenuous exercise (like a brisk walk)?: 7 days    On average, how many minutes do you engage in exercise at this level?: 20 min  Stress: Stress Concern Present (03/05/2023)   Received from [facility] - Occupational Stress Questionnaire    Feeling of Stress : To some extent  Social Connections: Moderately Isolated (03/05/2023)   Received from [facility]   Social Connection and Isolation Panel    In a typical week, how many times do you talk on the phone with family, friends, or neighbors?: More than three times a week    How often do you get together with friends or relatives?: More than three times a week    How often do you attend church or religious services?: More than 4 times per year    Do you belong to any clubs or organizations such as church groups, unions, fraternal or athletic groups, or school groups?: No    How often do you attend meetings of the clubs or organizations you belong to?: Never    Are you married, widowed, divorced, separated, never married, or living with a partner?: Divorced     Family History:  The patient's family history includes Diabetes in her mother; Ehlers-Danlos syndrome in her daughter; Familial dysautonomia in her daughter; Heart failure in her mother; Hypertension in her mother. There is no history of Breast cancer.  ROS:   12-point review of systems is negative unless otherwise noted in the HPI.   EKGs/Labs/Other Studies Reviewed:    Studies reviewed were summarized above. The additional studies were reviewed today:  Zio patch  10/2022: Predominant rhythm was sinus rhythm 2 VT episodes, longest 8 beats 69 SVT episodes, longest 12 seconds 585 pauses, longest 8.2 seconds Multiple episodes of junctional rhythm Pauses/junctional rhythm associated with triggered episodes 1.4% supraventricular ectopy 3.2% ventricular ectopy __________  LHC 10/30/2022:   Mid RCA to Dist RCA lesion is 20% stenosed.   1. mild nonobstructive coronary artery disease. 2.  Left ventricular angiography was not performed.  EF was normal by echo. 3.  Typical spike and dome appearance of left ventricular tracing consistent with hypertrophic cardiomyopathy.  Mild resting unprovoked LVOT obstruction with peak systolic gradient of 26 mmHg.   Recommendations: Suspect that elevated troponin is likely due to supply demand mismatch in the setting of hypertrophic cardiomyopathy likely with underlying uncontrolled tachycardia.  I am hesitant to add a beta-blocker given history of junctional bradycardia as well as documented 2.4-second pauses on telemetry.  I consulted Dr. 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 __________  2D echo 10/30/2022: 1. Left ventricular ejection fraction, by estimation, is 65 to 70%. The  left ventricle has normal function. The left ventricle has no regional  wall motion abnormalities. There is moderate asymmetric left ventricular  hypertrophy of the septal segment.  Left ventricular diastolic parameters are indeterminate. No significant  LVOT gradient even with Valsalva.   2. Right ventricular systolic function is normal. The right ventricular  size is normal. There is mildly elevated pulmonary artery systolic  pressure.   3. Left atrial size was mildly dilated.   4. Right atrial size was  mildly dilated.   5. The mitral valve is normal in structure. Mild mitral valve  regurgitation. No evidence of mitral stenosis.   6. Tricuspid valve regurgitation is moderate.    7. The aortic valve is normal in structure. Aortic valve regurgitation is  not visualized. No aortic stenosis is present.   8. The inferior vena cava is normal in size with <50% respiratory  variability, suggesting right atrial pressure of 8 mmHg.  __________  Intraoperative TEE 03/06/2022: POST-OP IMPRESSIONS  _ Left Ventricle: has hyperdynamic systolic function, with an ejection  fraction  of 75%. The cavity size was decreased. The wall motion is normal.  _ Right Ventricle: normal function. The wall motion is normal.  _ Aorta: there is no dissection present in the aorta.  _ Aortic Valve: No stenosis present. There is trace central regurgitation.  _ Mitral Valve: There is moderate regurgitation.  _ Tricuspid Valve: There is moderate regurgitation.   PRE-OP FINDINGS   Left Ventricle: The left ventricle has normal systolic function, with an  ejection fraction of 60-65%. The cavity size was normal. There is severe  concentric left ventricular hypertrophy.  __________  Vascular imaging 03/02/2022: Summary:  Right Carotid: Velocities in the right ICA are consistent with a 1-39%  stenosis.   Left Carotid: Velocities in the left ICA are consistent with a 1-39%  stenosis.  Vertebrals: Bilateral vertebral arteries demonstrate antegrade flow.  Subclavians: Normal flow hemodynamics were seen in bilateral subclavian               arteries.  __________  Coronary CTA 01/27/2022: Coronary calcium  score: The patient's coronary artery calcium  score is 393, which places the patient in the 99th percentile.   Coronary arteries: Normal coronary origins. Right dominance. Evaluation was limited as nitroglycerin  was not administered.   Right Coronary Artery: Dominant. Minimal mixed 1-24% proximal stenosis (CADRADS1).   Left Main Coronary Artery: Normal. Bifurcates into the LAD and LCx arteries.   Left Anterior Descending Coronary Artery: Minimal mixed proximal 1-24% stenosis (CADRADS1). The  distal LAD is not visualized.   Left Circumflex Artery: Large AV groove vessel with minimal mixed 1-24% proximal stenosis (CADRADS1). There is a large distal OM1 branch without disease.   Aorta: Normal size, 30 mm at the mid ascending aorta (level of the PA bifurcation) measured double oblique. No calcifications. No dissection.   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   Other findings:   Normal pulmonary vein drainage into the left atrium.   Normal left atrial appendage without a thrombus.   Significantly dilated main pulmonary artery to 40 mm, suggestive of pulmonary hypertension.   Severe proximal ventricular septal thickening c/w diagnosis of HCM.   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   IMPRESSION: 1. Minimal mixed non-obstructive CAD, CADRADS = 1 (findings limited due to lack of nitroglycerin  administration).   2. Coronary calcium  score of 393. This was 99th percentile for age and sex matched control.   3. Normal coronary origin with right dominance.   4. 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   5. Significantly dilated main pulmonary artery to 40 mm, suggestive of pulmonary hypertension.   6. Severe proximal ventricular septal thickening c/w diagnosis of HCM.  7. 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 __________  2D echo 11/13/2021: 1. Left ventricular ejection fraction, by estimation, is 60 to 65%. The  left ventricle has normal function. The left ventricle has no regional  wall motion abnormalities. There is severe concentric left ventricular  hypertrophy. Indeterminate diastolic  filling due to E-A fusion. Elevated left ventricular end-diastolic  pressure.   2.  Right ventricular systolic function is normal. The right ventricular  size is moderately enlarged.   3. Right atrial size was moderately dilated.   4. Left atrial size was severely dilated.   5. The mitral valve is normal in structure. Mild mitral valve  regurgitation. No evidence of mitral stenosis.   6. There is a soft tissue density on the noncoronary cusp extending into  the LVOT. 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 Differential dx includes  endocarditis (in right clinical setting) or valvular myxmoa. Images from  TEE done 11/08/21 demonstrate the   same soft tissue density (images 34-37 and 42 on TEE). There is also a  prominent nodule of Arantius on the [facility] that is normal finding.   7. Recommend cMRI for further tissue evaluation of the aortic valve mass. __________  TEE 11/08/2021: 1. Left ventricular ejection fraction, by estimation, is 50-55 %. The  left ventricle has normal function. The left ventricle has no regional  wall motion abnormalities. There is mild concentric left ventricular  hypertrophy.   2. Right ventricular systolic function is normal. The right ventricular  size is normal.   3. Left atrial size was severely dilated. No left atrial/left atrial  appendage thrombus was detected.   4. Right atrial size was moderately dilated.   5. The mitral valve is normal in structure. No evidence of mitral valve  regurgitation. No evidence of mitral stenosis.   6. Tricuspid valve regurgitation is moderate.   7. The aortic valve is normal in structure. Aortic valve regurgitation is  not visualized. Aortic valve sclerosis is present, with no evidence of  aortic valve stenosis.   8. The inferior vena cava is normal in size with greater than 50%  respiratory variability, suggesting right atrial pressure of 3 mmHg.   9. Agitated saline contrast bubble study was negative, with no evidence  of any interatrial shunt.  ___________  TEE 08/07/2020: 1. Left ventricular ejection  fraction, by estimation, is 50 to 55%. The  left ventricle has low normal function. The left ventricle has no regional  wall motion abnormalities. There is severe concentric left ventricular  hypertrophy. Left ventricular  diastolic function could not be evaluated.   2. Right ventricular systolic function is normal. The right ventricular  size is normal.   3. Left atrial size was severely dilated. No left atrial/left atrial  appendage thrombus was detected. The LAA emptying velocity was 36 cm/s.   4. Right atrial size was moderately dilated.   5. The mitral valve is grossly normal. Mild to moderate mitral valve  regurgitation.   6. Tricuspid valve regurgitation is moderate.   7. The aortic valve is tricuspid. There is mild calcification of the  aortic valve. There is mild thickening of the aortic valve. Aortic valve  regurgitation is not visualized.   8. The left upper pulmonary vein and left lower pulmonary vein are  abnormal.   9. Evidence of atrial level shunting detected by color flow Doppler.  __________  Cardiac MRI 01/28/2020: IMPRESSION: 1.  Normal LV chamber size, with hyperdynamic function, LVEF 67%.   2.  Normal RV  chamber size, RVEF 70%.   3. 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 Maximal wall thickness at the mid ventricular level is 19 mm.   4. Large area of dense delayed myocardial enhancement at the mid ventricular level in the area of maximal wall thickness. Visually appears > 15% of the myocardial mass.   5. Severe left atrial enlargement. Iatrogenic left to right atrial level shunt.   6.  No findings to suggests cardiac amyloidosis.  ECV 22%. __________  2D echo 07/31/2019: 1. Left ventricular ejection fraction, by visual estimation, is 60 to  65%. The left ventricle has normal function. There is moderately increased  left ventricular hypertrophy.   2. Left ventricular diastolic parameters are  indeterminate.   3. Global right ventricle has normal systolic function.The right  ventricular size is normal. No increase in right ventricular wall  thickness.   4. Left atrial size was severely dilated.   5. Right atrial size was mildly dilated   6. Moderately elevated pulmonary artery systolic pressure.   7. Rhythm is atrial fibrillation.     EKG:  EKG is ordered today.  The EKG ordered today demonstrates AV dual paced rhythm with prolonged AV conduction, 71 bpm  Recent Labs: 12/25/2023: ALT 24; Hemoglobin 10.9; Platelets 339 12/27/2023: TSH 3.390 02/26/2024: BUN 21; Creatinine, Ser 1.45; Potassium 4.2; Sodium 140  Recent Lipid Panel    Component Value Date/Time   CHOL 153 10/29/2022 1639   CHOL 165 08/02/2022 1507   TRIG 35 10/29/2022 1639   HDL 71 10/29/2022 1639   HDL 69 08/02/2022 1507   CHOLHDL 2.2 10/29/2022 1639   VLDL 7 10/29/2022 1639   LDLCALC 75 10/29/2022 1639   LDLCALC 83 08/02/2022 1507    PHYSICAL EXAM:    VS:  BP (!) 115/58 (BP Location: Left Arm, Patient Position: Sitting, Cuff Size: Normal)   Pulse 71   Ht 5' 4 (1.626 m)   Wt 151 lb 9.6 oz (68.8 kg)   SpO2 98%   BMI 26.02 kg/m   BMI: Body mass index is 26.02 kg/m.  Physical Exam Vitals reviewed.  Constitutional:      Appearance: She is well-developed.  HENT:     Head: Normocephalic and atraumatic.  Eyes:     General:        Right eye: No discharge.        Left eye: No discharge.  Neck:     Vascular: No JVD.  Cardiovascular:     Rate and Rhythm: Normal rate and regular rhythm.     Heart sounds: S1 normal and S2 normal. Heart sounds not distant. No midsystolic click and no opening snap. Murmur heard.     Systolic murmur is present with a grade of 1/6 at the upper left sternal border.     No friction rub.  Pulmonary:     Effort: Pulmonary effort is normal. No respiratory distress.     Breath sounds: Examination of the right-upper field reveals wheezing. Examination of the left-upper field  reveals wheezing. Wheezing present. No decreased breath sounds, rhonchi or rales.  Chest:     Chest wall: No tenderness.  Abdominal:     General: There is no distension.     Palpations: Abdomen is soft.     Tenderness: There is no abdominal tenderness.  Musculoskeletal:     Cervical back: Normal range of motion.     Comments: Trivial bilateral pretibial edema.  Skin:    General: Skin is warm and  dry.     Nails: There is no clubbing.  Neurological:     Mental Status: She is alert and oriented to person, place, and time.  Psychiatric:        Speech: Speech normal.        Behavior: Behavior normal.        Thought Content: Thought content normal.        Judgment: Judgment normal.     Wt Readings from Last 3 Encounters:  03/25/24 151 lb 9.6 oz (68.8 kg)  12/27/23 146 lb 6 oz (66.4 kg)  12/25/23 145 lb (65.8 kg)     ASSESSMENT & PLAN:   HCM/HFpEF/pulmonary hypertension: Recent imaging concerning for RV dysfunction with possible congestive hepatopathy.  OptiVol demonstrates some fluid accumulation, though below threshold.  Add spironolactone  12.5 mg daily with a follow-up BMP 1 week thereafter.  Obtain echo to evaluate for progressive cardiomyopathy, RV function, and RVSP.  Based on results may need to consider referral to advanced heart failure clinic.  She will continue torsemide  20 mg daily for now.  Nonobstructive CAD: No symptoms suggestive of angina.  Remains on aspirin  81 mg.  No longer on statin with liver dysfunction.  May need to consider PCSK9 inhibitor.  Persistent A-fib: Status post prior ablation x 2.  Maintained on amiodarone  per EP.  Remains on apixaban .  Follow-up with EP as directed.  History of VT: Status post ICD with no discharges or alarms.  Remains on amiodarone  under the direction of EP with recommendation for them to weigh in regarding liver function as outlined below.  Has previously not tolerated beta-blocker.  Abnormal LFTs: Suspected to be in the setting  of congestive hepatopathy.  However, query if her abnormal LFTs are related to amiodarone  use.  We will reach out to EP to have them weigh in on amiodarone  use and if they feel like this medication is contributing to liver dysfunction.  History of CVA: No new deficits.  On aspirin  and apixaban .  HTN: Blood pressure is well-controlled in the office today.      Disposition: F/u with Dr. Gollan or an APP in 1 month.   Medication Adjustments/Labs and Tests Ordered: Current medicines are reviewed at length with the patient today.  Concerns regarding medicines are outlined above. Medication changes, Labs and Tests ordered today are summarized above and listed in the Patient Instructions accessible in Encounters.   Signed, Bernardino Bring, PA-C 03/25/2024 5:08 PM     [facility] [place] [address] [place], [place] [address] [phone]

## 2024-03-25 ENCOUNTER — Encounter: Payer: Self-pay | Admitting: Physician Assistant

## 2024-03-25 ENCOUNTER — Ambulatory Visit: Attending: Physician Assistant | Admitting: Physician Assistant

## 2024-03-25 VITALS — BP 115/58 | HR 71 | Ht 64.0 in | Wt 151.6 lb

## 2024-03-25 DIAGNOSIS — I5032 Chronic diastolic (congestive) heart failure: Secondary | ICD-10-CM | POA: Diagnosis not present

## 2024-03-25 DIAGNOSIS — I10 Essential (primary) hypertension: Secondary | ICD-10-CM

## 2024-03-25 DIAGNOSIS — R945 Abnormal results of liver function studies: Secondary | ICD-10-CM

## 2024-03-25 DIAGNOSIS — I422 Other hypertrophic cardiomyopathy: Secondary | ICD-10-CM

## 2024-03-25 DIAGNOSIS — I4819 Other persistent atrial fibrillation: Secondary | ICD-10-CM

## 2024-03-25 DIAGNOSIS — I2510 Atherosclerotic heart disease of native coronary artery without angina pectoris: Secondary | ICD-10-CM | POA: Diagnosis not present

## 2024-03-25 DIAGNOSIS — Z95810 Presence of automatic (implantable) cardiac defibrillator: Secondary | ICD-10-CM

## 2024-03-25 DIAGNOSIS — I50812 Chronic right heart failure: Secondary | ICD-10-CM | POA: Diagnosis not present

## 2024-03-25 DIAGNOSIS — I272 Pulmonary hypertension, unspecified: Secondary | ICD-10-CM

## 2024-03-25 DIAGNOSIS — I472 Ventricular tachycardia, unspecified: Secondary | ICD-10-CM

## 2024-03-25 DIAGNOSIS — Z79899 Other long term (current) drug therapy: Secondary | ICD-10-CM

## 2024-03-25 MED ORDER — SPIRONOLACTONE 25 MG PO TABS: 12.5000 mg | ORAL_TABLET | Freq: Every day | ORAL | 3 refills | Status: DC

## 2024-03-25 NOTE — Patient Instructions (Signed)
 Medication Instructions:  Your physician recommends the following medication changes.  STOP TAKING: Potassium  START TAKING: Spironolactone  12.5 mg daily  *If you need a refill on your cardiac medications before your next appointment, please call your pharmacy*  Lab Work: Your provider would like for you to return in 1 week to have the following labs drawn: BMeT.   Please go to [facility] [address] ([facility] [address], [place] [address] You do not need an appointment.  They are open from 8 am- 4:30 pm.  Lunch from 1:00 pm- 2:00 pm You DO NOT need to be fasting.  If you have labs (blood work) drawn today and your tests are completely normal, you will receive your results only by: MyChart Message (if you have MyChart) OR A paper copy in the mail If you have any lab test that is abnormal or we need to change your treatment, we will call you to review the results.  Testing/Procedures: Your physician has requested that you have an echocardiogram. Echocardiography is a painless test that uses sound waves to create images of your heart. It provides your doctor with information about the size and shape of your heart and how well your heart's chambers and valves are working.   You may receive an ultrasound enhancing agent through an IV if needed to better visualize your heart during the echo. This procedure takes approximately one hour.  There are no restrictions for this procedure.  This will take place at [address] [facility]) [address], [place] [address]  Please note: We ask at that you not bring children with you during ultrasound (echo/ vascular) testing. Due to room size and safety concerns, children are not allowed in the ultrasound rooms during exams. Our front office staff cannot provide observation of children in our lobby area while testing is being conducted. An adult accompanying a patient to their appointment will only be allowed in  the ultrasound room at the discretion of the ultrasound technician under special circumstances. We apologize for any inconvenience.  Follow-Up: At [facility], you and your health needs are our priority.  As part of our continuing mission to provide you with exceptional heart care, our providers are all part of one team.  This team includes your primary Cardiologist (physician) and Advanced Practice Providers or APPs (Physician Assistants and Nurse Practitioners) who all work together to provide you with the care you need, when you need it.  Your next appointment:   1 month(s)  Provider:   You may see Timothy Gollan, MD or Bernardino Bring, PA-C

## 2024-03-28 NOTE — Progress Notes (Signed)
 Received reply from EP regarding the amiodarone .  Hannah Soyla Lunger, MD  Rebecah Dangerfield, Bernardino HERO, PA-C I think it is reasonable to hold amiodarone  for a month or 2 and recheck LFTs.  If LFTs have improved with improvement in congestive hepatopathy, could always restart amiodarone  and recheck after 3 to 4 months.  The other option would be to continue amiodarone  and recheck LFTs once congestive hepatopathy has improved.  As she has had therapy for VT, may be most beneficial to treat her hepatopathy prior to stopping amiodarone .  Could also reduce the dose to 100 mg daily.  Recommendations: -Patient is on amiodarone  for VT and is managed by EP.  I will route to last EP provider that saw the patient to decide how they would like to manage this medication moving forward given elevated liver function

## 2024-03-30 ENCOUNTER — Other Ambulatory Visit: Payer: Self-pay | Admitting: Cardiology

## 2024-04-01 ENCOUNTER — Other Ambulatory Visit: Payer: Self-pay | Admitting: Certified Nurse Midwife

## 2024-04-01 ENCOUNTER — Telehealth: Payer: Self-pay

## 2024-04-01 DIAGNOSIS — Z1231 Encounter for screening mammogram for malignant neoplasm of breast: Secondary | ICD-10-CM

## 2024-04-01 NOTE — Telephone Encounter (Signed)
-----   Message from Suzann Riddle sent at 04/01/2024  7:46 AM EDT ----- Please call patient to reduce amiodarone  to 100mg  daily ----- Message ----- From: Abigail Bernardino CHRISTELLA DEVONNA Sent: 03/28/2024   5:17 PM EDT To: Suzann Riddle, NP  Suzann,   Please see the reply from Dr. Inocencio I cut-and-paste it into this chart regarding management of amiodarone  with elevated LFTs.  She is on this medication for VT which is managed by EP.  I will defer amiodarone  management to EP

## 2024-04-01 NOTE — Telephone Encounter (Signed)
 Patient is returning your call.

## 2024-04-01 NOTE — Telephone Encounter (Signed)
 Attempted to contact patient to discuss medication adjustment recommendations.  Patient did not answer, unable to leave voicemail as it was full. Will try again later.

## 2024-04-01 NOTE — Telephone Encounter (Signed)
 Called patient- advised to cut amiodarone  to 100 mg daily. However, yesterday RX was sent to pharmacy to take 200 mg amiodarone  BID, - am I okay to decrease to the 100 mg daily tablet? Patient states she only takes 200 mg daily, and will cut the tablets in half.   Advised I would call back with recommendations.

## 2024-04-02 ENCOUNTER — Other Ambulatory Visit: Payer: Self-pay

## 2024-04-02 MED ORDER — AMIODARONE HCL 100 MG PO TABS: 100.0000 mg | ORAL_TABLET | Freq: Two times a day (BID) | ORAL | 1 refills | Status: DC

## 2024-04-02 MED ORDER — AMIODARONE HCL 100 MG PO TABS: 100.0000 mg | ORAL_TABLET | Freq: Every day | ORAL | 1 refills | Status: DC

## 2024-04-02 NOTE — Telephone Encounter (Signed)
 Called patient, updated RX in the system to Amiodarone  100 mg daily.   Patient made aware, verbalized understanding.

## 2024-04-16 ENCOUNTER — Ambulatory Visit: Payer: Self-pay | Admitting: Physician Assistant

## 2024-04-16 ENCOUNTER — Ambulatory Visit: Attending: Physician Assistant

## 2024-04-16 DIAGNOSIS — I50812 Chronic right heart failure: Secondary | ICD-10-CM | POA: Diagnosis not present

## 2024-04-16 DIAGNOSIS — Z79899 Other long term (current) drug therapy: Secondary | ICD-10-CM

## 2024-04-16 DIAGNOSIS — I272 Pulmonary hypertension, unspecified: Secondary | ICD-10-CM

## 2024-04-16 LAB — ECHOCARDIOGRAM COMPLETE
AR max vel: 2.28 cm2
AV Area VTI: 2.24 cm2
AV Area mean vel: 2.09 cm2
AV Mean grad: 4 mmHg
AV Peak grad: 6.8 mmHg
Ao pk vel: 1.3 m/s
Area-P 1/2: 5.84 cm2
S' Lateral: 4.06 cm

## 2024-04-17 LAB — BASIC METABOLIC PANEL WITH GFR
BUN/Creatinine Ratio: 16 (ref 9–23)
BUN: 20 mg/dL (ref 6–24)
CO2: 23 mmol/L (ref 20–29)
Calcium: 9 mg/dL (ref 8.7–10.2)
Chloride: 102 mmol/L (ref 96–106)
Creatinine, Ser: 1.29 mg/dL — ABNORMAL HIGH (ref 0.57–1.00)
Glucose: 60 mg/dL — ABNORMAL LOW (ref 70–99)
Potassium: 3.8 mmol/L (ref 3.5–5.2)
Sodium: 140 mmol/L (ref 134–144)
eGFR: 50 mL/min/1.73 — ABNORMAL LOW (ref >59)

## 2024-04-17 MED ORDER — TORSEMIDE 20 MG PO TABS: 40.0000 mg | ORAL_TABLET | Freq: Every day | ORAL | 3 refills | Status: DC

## 2024-04-21 ENCOUNTER — Other Ambulatory Visit: Payer: Self-pay | Admitting: Adult Health

## 2024-04-21 ENCOUNTER — Encounter: Admitting: Cardiology

## 2024-04-23 NOTE — Progress Notes (Unsigned)
 [facility] [address] [place]. [place] [address] [phone]       OFFICE FOLLOW UP NOTE  Ms. [name] Date of Birth:  [birth year] Medical Record Number:  [id number]    Primary neurologist: Dr. Rosemarie Reason for visit: Stroke follow-up, tension headaches   Chief complaint: No chief complaint on file.     HPI:  Update 04/24/2024 JM: Hannah Zimmerman returns for follow-up visit after prior visit 5 months ago noting frequent headaches and was started on Ajovy  in addition to amitriptyline  for prevention and Nurtec for rescue, also noted worsening imbalance and blurred vision over the past 6 weeks and recommended proceeding with CT scan which showed no acute abnormality and referred to PT and ophthalmology.       History provided for reference purposes only Update 12/03/2023 JM: Hannah Zimmerman returns for 80-month follow-up unaccompanied.  At prior visit, was started on amitriptyline  25mg  qhs for persistent daily headaches, topiramate  discontinued due to brain fog sensation and started Nurtec as needed for more severe headaches.  She reports continued headaches although improved since initiating amitriptyline , currently experiencing about 15 to 16/month with about 5 severe migraines per month.  She has not yet tried Nurtec as pharmacist mentioned interaction with amiodarone .  Use of Tylenol  as needed for more severe migraines with limited benefit, denies overuse.  Headaches continue to be present in occipital area, can be tender to touch.  Headaches occasionally associated with visual aura.  She also mentions worsening balance over the past 6 weeks.  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  Has had a couple falls but thankfully no significant injury.  Denies dizziness, lightheadedness or vertigo sensation.  Denies lower extremity numbness/tingling.   Denies swallowing difficulty.  Does have chronic slurred speech post stroke which can fluctuate, notes worsening slurred speech with stress which she feels is worse from baseline.  She feels generalized weakness, like she has 20 pound weights on her arms and legs, notes increased issues with edema and has follow-up visit next week with cardiology to further discuss.  She did start PT about 5 weeks ago at [facility] clinic due to bilateral lower extremity weakness (ordered by PCP) but was only able to participate in 1 session due to episode of shingles and recently undergoing cholecystectomy on 3/10.  Also notes worsening blurred vision bilaterally, denies visual loss or diplopia, has not yet seen ophthalmology.  She continues to participate in [place] research trial (on apixaban  or placebo), remains on pravastatin .  Monitors glucose levels at home which have been normal, monitors blood pressure at home and typically ranges 100/60-80s.   Update 05/30/2023 JM: Hannah Zimmerman returns for follow-up visit accompanied by her daughter.  At prior visit, complained of daily bitemporal headaches, suspected tension type headaches with analgesic rebound component and cervical muscle tightness, started on topiramate  25 mg BID. Repeat CTH no new or acute findings.   Today, she complains of persistent daily headaches, denies benefit with topiramate  25 mg BID, reports worsening brain fog on current dosage.  She can have worsening headaches about 3x per week which are debilitating.  Located occipital mainly but can be generalized at times. Will use Tylenol  only occasionally but denies benefit.  Does have chronic neck pain, recent cervical x-ray showed degenerative disc disease worse at C5-C6 and C6-C7, reports PCP plans on sending to specialist for further evaluation.   She also mentions fluctuation of  left hand weakness with numbness/tingling, slurred speech and imbalance. This has been persistent since her stroke. Denies correlation  with specific triggers such as fatigue, stress, overexertion or headaches. She tries to stay active, plans on starting water aerobics soon. Denies new stroke/TIA symptoms.  Remains on Eliquis , aspirin  and pravastatin .  Routinely follows with PCP and cardiology for stroke risk factor management.  Update 02/01/2023 Dr. Rosemarie : She returns for follow-up after last visit 6 months ago.  She states she is doing well.  She has had no recurrent stroke or TIA symptoms.  She had an ICD placed on 12/15/2022 by Dr. Inocencio and a week later while in the grocery store she felt sharp.  He is rehospitalized and found to have rapid heart rate and started on amiodarone .  He has done well since then.  She however has a new complaint of daily headaches that she is after last 6 months.  She describes  these as bitemporal moderate severity occasionally spreading to the back of her head.  There is no accompanying nausea or vomiting or light or sound sensitivities.  She does have to lie down sometimes to get relief.  She takes Tylenol  which takes the edge off but headache does not completely go away.  She does admit to some muscle tightness in the back of her head and neck.  Has no prior history of similar headaches.  She does have remote history of migraine headaches but those are quite different.  She is unable to identify specific triggers or relieving factors for the headaches.  She has not had any recent brain imaging done.  She remains on Eliquis  which she is tolerating well with easy bruising but no bleeding episodes.  She is tolerating Pravachol  well without muscle aches and pains.  Last lipid profile on 10/29/2022 showed LDL cholesterol to be 75 mg percent.  Hemoglobin A1c was borderline at 6.1.  She continues to have mild short-term memory difficulties but these stable and nonprogressive.  She is independent in all activities of daily living.  Update 08/02/2022 Dr. Rosemarie; she returns for follow-up after last visit with  me 6  months ago.  She is accompanied by her daughter.  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  Surgery went well without complications but she has noticed worsening of her existing stroke symptoms with speech and word finding difficulties, depth perception on the right and she is tired.  She denies any new strokelike symptoms.  She is back on Eliquis  and is tolerating it well without bleeding or bruising.  She is tolerating Lipitor well without muscle aches.  Her blood pressure is under good control and today it is 130/93.  Having mild short-term memory difficulties and trouble remembering recent information.  He has not been participating in cognitively challenging activities.  He has not had any evaluation for her memory loss.  Initial visit 01/18/22 Dr. Rosemarie ; Mr. Chesterfield is a 53 year old pleasant Caucasian lady seen today for initial office consultation visit for stroke.  History is obtained from the Hannah Zimmerman and her daughter as well as review of electronic medical records and opossum reviewed pertinent available imaging films in PACS.  She has past medical history of atrial fibrillation/flutter, congestive heart failure, tobacco abuse who presented to [facility] on 11/12/2021 with sudden onset of lethargy, left-sided weakness, slurred speech and gaze deviation.  NIH stroke scale was 24 on admission.  She was not a candidate for IV thrombolysis as she was on Eliquis .  Noncontrast CAT scan of the head showed aspect score of 10 with developing some hypodensity in the right hemisphere and dense right MCA sign.  CT angiogram of the head and neck showed nonopacification proximal to mid right ICA extending into the intracranial portion of the terminus and proximal right MCA.  Hannah Zimmerman was emergently transferred to [facility] and underwent successful mechanical thrombectomy with TICI3  revascularization by Dr. Dolphus.  She was kept in the intensive care unit and closely monitored and blood pressure was tightly controlled.  She did very well and noticed significant improvement in the left-sided deficits.  Echocardiogram showed ejection fraction of 60 to 65% with severe concentric left ventricular hypertrophy.  LDL cholesterol was 87 mg percent.  Hemoglobin A1c of 6.1.  MRI scan of the brain shows patchy infarction in the right basal ganglia with some mild petechial hemorrhage.  There was also subtle diffusion signal in the adjacent right frontal and insular cortex.  Discussion was made about switching Eliquis  to alternative anticoagulants but Hannah Zimmerman's insurance co-pay would be quite high and hence it was decided to continue Eliquis .  Hannah Zimmerman was discharged home with home physical and Occupational Therapy.  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  The Hannah Zimmerman states her left-sided deficits have mostly resolved.  She has cut back smoking but still smokes about 6 cigarettes a day.  She is complaining of chronic headaches since her stroke.  These are mostly in the back of the head and top of the neck they are mild to moderate dull in quality.  No accompanying light or sound sensitivity or nausea.  She takes Tylenol  2 tablets twice daily for last several months.  She does admit to significant increase stress in her life and she has not been participating in any stress laxation activity.  She does complain of decreased strength and difficulty with focusing and plans to see an eye doctor.     ROS:   14 system review of systems is positive for those listed in HPI and all other systems negative  PMH:  Past Medical History:  Diagnosis Date   (HFpEF) heart failure with preserved ejection fraction (HCC)    a. 07/2019 Echo: EF 60-65%, mod LVH. Sev dil LA, mildly dil RA. Mod elev PASP; b. 07/2020 TEE:  EF 50-55%, no rwma, sev conc LVH. Nl RV fxn. Mod dil RA. Mild-mod MR, mod TR.   Agatston coronary artery calcium  score between 200 and 399    a. 07/2020 Cardiac CT: Cor Ca2+ = 338 (99th %'ile).   Complication of anesthesia 20 years ago   a. spinal with first c section went too high stopped breathing, low bp with 2nd c section, no further issues with anesthesia   Heart murmur    a. 07/2019 Echo: no significant valvular dzs; b. 07/2020 TEE: Mild-mod MR, mod TR.   History of hiatal hernia    Hypertrophic cardiomyopathy (HCC)    a. 07/2019 Echo: Mod LVH; b. 01/2020 cMRI: EF 67%, HCM w/o obstruction. Max wall thickness 19mm. Sev LAE w/ L->R atrial level shunt. Large area of LGE @ mid-ventricular level in area of max wall thickness; c. 07/2020 Cardiac CT: Asymm hypertrophy up to 19mm in mid inferoseptum consistent w/ known HCM.   Morbid obesity (HCC)    a. 09/2014 s/p gastric bypass.  Persistent atrial fibrillation (HCC)    a. Dx 07/2019-->CHA2DS2VASc = 2 (diast CHF/Fem)-->Eliquis ; b. 09/2018 s/p DCCV (150J (biphasic) x 1); c. 12/2019 RFCA/PVI; d. 07/2020 repeat RFCA/PVI.   Rash    both arms from old bed bug bites, healing   Stroke [facility])    Tobacco abuse    Typical atrial flutter (HCC)    a. 12/2019 s/p RFCA.    Social History:  Social History   Socioeconomic History   Marital status: Significant Other    Spouse name: Not on file   Number of children: 3   Years of education: two years of college   Highest education level: Not on file  Occupational History   Occupation: office assistant  Tobacco Use   Smoking status: Former    Current packs/day: 0.00    Average packs/day: 0.3 packs/day for 31.0 years (7.8 ttl pk-yrs)    Types: Cigarettes    Start date: 10/04/1991    Quit date: 10/03/2022    Years since quitting: 1.5    Passive exposure: Current   Smokeless tobacco: Never  Vaping Use   Vaping status: Never Used  Substance and Sexual Activity   Alcohol use: Yes    Alcohol/week: 1.0 -  2.0 standard drink of alcohol    Types: 1 - 2 Standard drinks or equivalent per week    Comment: Occaional use - 1-2 mixed drinks prior to stroke since d/c nothing 11/30/21   Drug use: No   Sexual activity: Yes  Other Topics Concern   Not on file  Social History Narrative   Lives at home with significant other.   Some caffeine daily I her green tea.   Right-handed.   Social Drivers of Corporate investment banker Strain: Low Risk  (04/01/2024)   Received from [facility]   Overall Financial Resource Strain (CARDIA)    Difficulty of Paying Living Expenses: Not very hard  Food Insecurity: No Food Insecurity (04/01/2024)   Received from [facility]   Hunger Vital Sign    Within the past 12 months, you worried that your food would run out before you got the money to buy more.: Never true    Within the past 12 months, the food you bought just didn't last and you didn't have money to get more.: Never true  Recent Concern: Food Insecurity - Food Insecurity Present (03/17/2024)   Received from [facility]   Hunger Vital Sign    Within the past 12 months, you worried that your food would run out before you got the money to buy more.: Sometimes true    Within the past 12 months, the food you bought just didn't last and you didn't have money to get more.: Never true  Transportation Needs: No Transportation Needs (04/01/2024)   Received from [facility] - Transportation    In the past 12 months, has lack of transportation kept you from medical appointments or from getting medications?: No    Lack of Transportation (Non-Medical): No  Physical Activity: Insufficiently Active (03/05/2023)   Received from [facility]   Exercise Vital Sign    On average, how many days per week do you engage in moderate to strenuous exercise (like a brisk walk)?: 7 days    On average, how many minutes do you engage in  exercise at this level?: 20 min  Stress: Stress Concern Present (03/05/2023)   Received from [facility]   [employer] of Occupational Health - Occupational Stress Questionnaire    Feeling of Stress : To some extent  Social Connections: Moderately Isolated (03/05/2023)   Received from [facility]   Social Connection and Isolation Panel    In a typical week, how many times do you talk on the phone with family, friends, or neighbors?: More than three times a week    How often do you get together with friends or relatives?: More than three times a week    How often do you attend church or religious services?: More than 4 times per year    Do you belong to any clubs or organizations such as church groups, unions, fraternal or athletic groups, or school groups?: No    How often do you attend meetings of the clubs or organizations you belong to?: Never    Are you married, widowed, divorced, separated, never married, or living with a partner?: Divorced  Intimate Partner Violence: Not At Risk (01/03/2023)   Humiliation, Afraid, Rape, and Kick questionnaire    Fear of Current or Ex-Partner: No    Emotionally Abused: No    Physically Abused: No    Sexually Abused: No    Medications:   Current Outpatient Medications on File Prior to Visit  Medication Sig Dispense Refill   acetaminophen  (TYLENOL ) 500 MG tablet Take 500 mg by mouth every 6 (six) hours as needed for moderate pain or headache. (Hannah Zimmerman not taking: Reported on 03/25/2024)     amiodarone  (PACERONE ) 100 MG tablet Take 1 tablet (100 mg total) by mouth daily. 90 tablet 1   amitriptyline  (ELAVIL ) 25 MG tablet TAKE 1 TABLET BY MOUTH EVERYDAY AT BEDTIME 30 tablet 2   aspirin  EC 81 MG tablet Take 1 tablet (81 mg total) by mouth daily. Swallow whole. 30 tablet 12   b complex vitamins capsule Take 1 capsule by mouth daily.     busPIRone (BUSPAR) 5 MG tablet Take 5 mg by mouth 2 (two) times daily.      cholecalciferol  (VITAMIN D3) 25 MCG (1000 UNIT) tablet Take 1,000 Units by mouth daily.     dicyclomine  (BENTYL ) 20 MG tablet Take 1 tablet (20 mg total) by mouth every 6 (six) hours. (Hannah Zimmerman not taking: Reported on 03/25/2024) 20 tablet 0   Fremanezumab -vfrm (AJOVY ) 225 MG/1.5ML SOAJ Inject 225 mg into the skin every 30 (thirty) days. 1.68 mL 11   gabapentin (NEURONTIN) 100 MG capsule Take 100 mg by mouth at bedtime.     lactulose  (CHRONULAC ) 10 GM/15ML solution Take 30 mLs (20 g total) by mouth daily as needed for mild constipation. (Hannah Zimmerman not taking: Reported on 03/25/2024) 120 mL 0   Omega-3 Fatty Acids (FISH OIL) 1000 MG CAPS Take 100 mg by mouth daily.     pravastatin  (PRAVACHOL ) 20 MG tablet Take 20 mg by mouth daily.     Rimegepant Sulfate (NURTEC) 75 MG TBDP Take 1 tablet (75 mg total) by mouth as needed (take 1 tab at migraine onset. (Do not take more than 1 tablet in 24 hour period)). (Hannah Zimmerman not taking: Reported on 03/25/2024) 8 tablet 5   spironolactone  (ALDACTONE ) 25 MG tablet Take 0.5 tablets (12.5 mg total) by mouth daily. 45 tablet 3   STUDY - LIBREXIA-AF - apixaban  5 mg or placebo capsule (PI-Sethi) Take 1 capsule (5 mg total) by mouth 2 (two) times daily. Take at approximately the same time of day with or without food. Please bring bottle back with  you to every visit; do not discard bottle. For Investigational Use only. 280 capsule 0   STUDY - LIBREXIA-AF - [id number] (Milvexian) 100 mg or placebo tablet (PI-Sethi) Take 1 tablet by mouth 2 (two) times daily. Take at approximately the same time of day with or without food. Please bring bottle back with you to every visit; do not discard bottle. For Investigational Use only. 280 tablet 0   torsemide  (DEMADEX ) 20 MG tablet Take 2 tablets (40 mg total) by mouth daily. 180 tablet 3   No current facility-administered medications on file prior to visit.    Allergies:   Allergies  Allergen Reactions   Atorvastatin  Nausea Only   Atarax  [Hydroxyzine] Palpitations   Codeine Hives        Hydrocodone Hives    Physical Exam There were no vitals filed for this visit.  There is no height or weight on file to calculate BMI.  General: well developed, well nourished, pleasant middle-age Caucasian female, seated, in no evident distress Head: head normocephalic and atraumatic.   Neck: supple with no carotid or supraclavicular bruits Cardiovascular: regular rate and rhythm, no murmurs Musculoskeletal: no deformity Skin:  no rash/petichiae; bilateral LE edema  Vascular:  Normal pulses all extremities  Neurologic Exam Mental Status: Awake and fully alert.  No evidence of aphasia or dysarthria during visit.  Oriented to place and time. Recent and remote memory intact. Attention span, concentration and fund of knowledge appropriate. Mood and affect appropriate. Cranial Nerves: Pupils equal, briskly reactive to light. Extraocular movements full without nystagmus. Visual fields full to confrontation. Hearing intact. Facial sensation intact. Face, tongue, palate moves normally and symmetrically.  Motor: Normal bulk and tone. Normal strength in all tested extremity muscles except slightly weak left grip strength.  Diminished fine finger movements on the left.  Orbits right over left upper extremity.  Unable to appreciate lower extremity weakness. Sensory.: intact to touch , pinprick , position and vibratory sensation.  Coordination: Rapid alternating movements slightly impaired left hand. Finger-to-nose and heel-to-shin performed accurately bilaterally. Gait and Station: Arises from chair without difficulty. Stance is normal. Gait demonstrates slow cautious steps with decreased stride length and step height bilaterally with increased difficulty with turns.  Able to tandem walk and heel toe without great difficulty.  Romberg positive. Reflexes: 1+ and symmetric. Toes downgoing.         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  Hannah Zimmerman has been extremely well with hardly any residual deficits.  Vascular risk factors of atrial fibrillation, hyperlipidemia, hypertension and smoking.  She has had worsening of her stroke deficits following an open heart surgery in June 2023 along with mild cognitive impairment which appears stable.  Complaints of daily persistent headache since around 08/2022 likely transformed tension headaches with analgesic rebound, CTH negative for new findings.  Today, 12/03/2023, reports worsening persistent imbalance over the past 6 weeks as well as worsening blurred vision.  Notes improvement of headaches on amitriptyline  but still occurring 15 to 16/month and about 5 migrainous headaches per month.    PLAN  Tension headaches Migrainous type headaches Start Ajovy  monthly injection Start Nurtec as needed, do not take more than 1 in a 48-hour period due to current use of amiodarone  Continue amitriptyline  25 mg nightly -hesitant to increase further due to current use of amiodarone  No current medication overuse  Previously tried/failed: Amitriptyline , topiramate , triptans contraindicated  Gait impairment with imbalance Worsening blurred vision Chronic issue poststroke but has been more persistent, suspect multifactoral but due to history of prior stroke, recommend completion of CT head to rule out vascular etiology Recommend restarting PT at [facility] Recommend follow-up with ophthalmology for blurred vision concerns  R MCA stroke Reports fluctuation of left hand weakness with numbness, slurred speech and balance. Present since stroke onset.  Continue aspirin  and pravastatin  and participation in [place] research trial for secondary stroke prevention measures managed by PCP/cardiology. Reports use of aspirin  recommended  by cardiology, is not needed from a stroke standpoint.  Continue close follow-up with PCP for stroke risk factor management including BP goal<130/90 and HLD with LDL goal<70 and pre-DM with A1c goal <7    Follow-up in 4-6 months or call earlier if needed    I personally spent a total of *** minutes in the care of the Hannah Zimmerman today including {Time Based Coding:210964241}.   Harlene Bogaert, AGNP-BC  [facility] [address] [place], [place] [address]  Phone [phone] Fax [phone] Note: This document was prepared with digital dictation and possible smart phrase technology. Any transcriptional errors that result from this process are unintentional.

## 2024-04-24 ENCOUNTER — Encounter: Payer: Self-pay | Admitting: Adult Health

## 2024-04-24 ENCOUNTER — Ambulatory Visit: Admitting: Adult Health

## 2024-04-24 VITALS — BP 90/64 | HR 76 | Ht 64.0 in | Wt 151.4 lb

## 2024-04-24 DIAGNOSIS — Z8673 Personal history of transient ischemic attack (TIA), and cerebral infarction without residual deficits: Secondary | ICD-10-CM | POA: Diagnosis not present

## 2024-04-24 DIAGNOSIS — G43E11 Chronic migraine with aura, intractable, with status migrainosus: Secondary | ICD-10-CM

## 2024-04-24 DIAGNOSIS — R29818 Other symptoms and signs involving the nervous system: Secondary | ICD-10-CM

## 2024-04-24 DIAGNOSIS — G44229 Chronic tension-type headache, not intractable: Secondary | ICD-10-CM

## 2024-04-24 MED ORDER — NORTRIPTYLINE HCL 25 MG PO CAPS: 25.0000 mg | ORAL_CAPSULE | Freq: Every day | ORAL | 11 refills | Status: AC

## 2024-04-24 MED ORDER — EMGALITY 120 MG/ML ~~LOC~~ SOAJ: 240.0000 mg | Freq: Once | SUBCUTANEOUS | 0 refills | Status: AC

## 2024-04-24 MED ORDER — EMGALITY 120 MG/ML ~~LOC~~ SOAJ: 120.0000 mg | SUBCUTANEOUS | 11 refills | Status: AC

## 2024-04-24 NOTE — Patient Instructions (Addendum)
 Your Plan:  Switch monthly injectable from Ajovy  to Emgality  - please give at least 3 injections to see if beneficial.   Will start Emgality  with a loading dose (will take 2 injections) and then will repeat every 30 days with only 1 injection  Will switch amitriptyline  to nortriptyline  25 mg nightly - after 1 week if headaches persist, please let me know  Will hold off on rescue medication currently until migraines better under control   Continue close follow-up with PCP and cardiology  You will be called to complete a home sleep study      Follow up in 6 months or call earlier if needed      Thank you for coming to see us  at [facility]. I hope we have been able to provide you high quality care today.  You may receive a patient satisfaction survey over the next few weeks. We would appreciate your feedback and comments so that we may continue to improve ourselves and the health of our patients.

## 2024-04-28 ENCOUNTER — Other Ambulatory Visit: Payer: Self-pay | Admitting: Gastroenterology

## 2024-04-28 DIAGNOSIS — R7989 Other specified abnormal findings of blood chemistry: Secondary | ICD-10-CM

## 2024-04-30 ENCOUNTER — Encounter: Payer: Self-pay | Admitting: Physician Assistant

## 2024-04-30 ENCOUNTER — Encounter: Payer: Self-pay | Admitting: Adult Health

## 2024-04-30 ENCOUNTER — Ambulatory Visit: Attending: Physician Assistant | Admitting: Physician Assistant

## 2024-04-30 VITALS — BP 108/78 | HR 75 | Ht 64.0 in | Wt 151.0 lb

## 2024-04-30 DIAGNOSIS — I10 Essential (primary) hypertension: Secondary | ICD-10-CM

## 2024-04-30 DIAGNOSIS — I422 Other hypertrophic cardiomyopathy: Secondary | ICD-10-CM

## 2024-04-30 DIAGNOSIS — I2510 Atherosclerotic heart disease of native coronary artery without angina pectoris: Secondary | ICD-10-CM

## 2024-04-30 DIAGNOSIS — I4819 Other persistent atrial fibrillation: Secondary | ICD-10-CM

## 2024-04-30 DIAGNOSIS — I272 Pulmonary hypertension, unspecified: Secondary | ICD-10-CM

## 2024-04-30 DIAGNOSIS — Z95810 Presence of automatic (implantable) cardiac defibrillator: Secondary | ICD-10-CM

## 2024-04-30 DIAGNOSIS — Z8673 Personal history of transient ischemic attack (TIA), and cerebral infarction without residual deficits: Secondary | ICD-10-CM

## 2024-04-30 DIAGNOSIS — I472 Ventricular tachycardia, unspecified: Secondary | ICD-10-CM

## 2024-04-30 DIAGNOSIS — Z79899 Other long term (current) drug therapy: Secondary | ICD-10-CM

## 2024-04-30 DIAGNOSIS — R945 Abnormal results of liver function studies: Secondary | ICD-10-CM

## 2024-04-30 NOTE — Patient Instructions (Signed)
 Medication Instructions:  Your physician recommends that you continue on your current medications as directed. Please refer to the Current Medication list given to you today.   *If you need a refill on your cardiac medications before your next appointment, please call your pharmacy*  Lab Work: Your provider would like for you to have following labs drawn today CMeT.   If you have labs (blood work) drawn today and your tests are completely normal, you will receive your results only by: MyChart Message (if you have MyChart) OR A paper copy in the mail If you have any lab test that is abnormal or we need to change your treatment, we will call you to review the results.  Follow-Up: At [facility], you and your health needs are our priority.  As part of our continuing mission to provide you with exceptional heart care, our providers are all part of one team.  This team includes your primary Cardiologist (physician) and Advanced Practice Providers or APPs (Physician Assistants and Nurse Practitioners) who all work together to provide you with the care you need, when you need it.  Your next appointment:   1 month(s)  Provider:   You may see Timothy Gollan, MD or Bernardino Bring, PA-C

## 2024-04-30 NOTE — Progress Notes (Signed)
 Cardiology Office Note    Date:  04/30/2024   ID:  Powell JONELLE Harari, DOB Feb 12, 1971, MRN [id number]  PCP:  Cyrus Selinda Moose, PA-C  Cardiologist:  Evalene Lunger, MD  Electrophysiologist:  Soyla Gladis Norton, MD   Chief Complaint: Follow-up  History of Present Illness:   Hannah Zimmerman is a 53 y.o. 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   Echo in 07/2019 showed an EF of 60 to 65%, moderately increased LVH, normal RV systolic function and ventricular cavity size with no increase in RV wall thickness, severely dilated left atrium, mildly dilated right atrium, moderately elevated RVSP, and a rhythm of A-fib.  She was subsequently evaluated by EP and underwent A-fib ablation in 12/2019.  She underwent cardiac MRI in 01/2020 that showed an EF of 67% with findings suggestive of hypertrophic cardiomyopathy without obstruction and no findings to suggest cardiac amyloidosis.  In the setting of recurrent A-fib she underwent repeat A-fib ablation in 07/2021.  Echo in 10/2021, performed in the setting of CVA, showed an EF of 60 to 65%, no regional wall motion abnormalities, severe concentric LVH, normal RV systolic function with moderately enlarged ventricular cavity size, moderately dilated right atrium, severely dilated left atrium, mild mitral regurgitation soft tissue density on the noncoronary cusp extending into the LVOT felt to likely represent a fibroblastoma or valvular myxoma.  She subsequently underwent surgical resection of papillary fibroblastoma with Maze procedure and left atrial appendage clipping.     She was  admitted in 10/2022 with chest pain with mildly elevated troponin.  Echo in 10/2022 showed an EF of 65 to 70%, no regional wall motion abnormalities, moderate asymmetric LVH of the septal segment, no significant LVOT gradient, normal RV systolic function and ventricular cavity size, mildly elevated RVSP estimated at 38.9 mmHg, mild biatrial enlargement, mild mitral regurgitation, moderate tricuspid regurgitation, and aortic valve normal in structure.  LHC in 10/2022 showed mild nonobstructive CAD with mid to distal RCA stenosis of 20%.  Left ventricular tracing consistent with hypertrophic cardiomyopathy with mild resting unprovoked LVOT obstruction with a peak gradient of 26 mmHg.  At that time, she was not started on beta-blocker given history of junctional bradycardia as well as documented 2.4-second pauses on telemetry.  She was evaluated by EP with outpatient cardiac monitoring showing 2 episodes of NSVT lasting up to 8 beats, 69 episodes of SVT lasting up to 12 seconds, 585 pauses lasting up to 8.2 seconds, multiple episodes of junctional rhythm with pauses and junctional rhythm associated patient triggered events, 1.4% burden of supraventricular ectopy, and 3.2% burden of ventricular ectopy.  In this setting she underwent dual-chamber ICD implantation in 11/2022.   She was evaluated in 12/2023 for lower abdominal pain with CT of the abdomen/pelvis showing reflux of contrast into the hepatic venous system consistent with some degree of right heart failure and the passive congestion of the liver and hepatic changes felt to reflect congestive hepatopathy.   More recently, the patient had a liver ultrasound done in 02/2024 that showed a dilated IVC with recommendation for the patient to take torsemide  40 mg daily alternating with 20 mg daily.  She had follow-up labs drawn  on 03/17/2024 that showed improving AST/ALT and increasing alkaline phosphatase levels with recommendation to reach out to cardiology for ongoing  management of fluid overload.  (AST 164, ALT 96, alkaline phosphatase 250 on 02/26/2024 trending to AST 50, ALT 44, alkaline phosphatase 441). OptiVol on 03/20/2024 shows a slight fluid accumulation, though below threshold.  Right upper quadrant ultrasound in 03/2024 showed diffusely heterogeneous hepatic parenchyma indicating nonspecific hepatocellular dysfunction with no hepatic lesion identifie  She was evaluated on 03/25/2024 continuing to feel like she was holding onto excess fluid noting that her abdomen felt distended and also with mild lower extremity swelling.  She was without symptoms of frank chest pain.  At that time she was taking torsemide  and amiodarone .  OptiVol demonstrated some fluid accumulation, though was below threshold.  She was started on spironolactone  12.5 mg daily with continuation of torsemide .  We also reached out to EP who recommended amiodarone  be decreased to 100 mg daily.  Echo on 04/16/2024 showed an EF of 45 to 50%, no regional wall motion abnormalities, mild LVH, indeterminate LV diastolic function parameters, normal RV systolic function and ventricular cavity size, severely elevated RVSP estimated at 68 mmHg, severely dilated left atrium, mildly dilated right atrium, moderate mitral regurgitation, moderate to severe tricuspid regurgitation, dilated IVC suggestive of an estimated right atrial pressure of 15 mmHg.  Based on echo results, torsemide  was increased to 40 mg daily with continuation of spironolactone  and recommendation for the patient to be evaluated by the advanced heart failure service.  She comes in today accompanied by her daughter and is doing well from a cardiac perspective, without symptoms of angina or cardiac decompensation.  She notes vigorous urine output on titrated dose of torsemide .  With this she feels like her abdomen is less distended and notes an improvement in lower extremity swelling.  No progressive orthopnea.  She indicates that she will be undergoing  liver biopsy next week and has notified her drug study program of the need to interrupt therapy.  Weight stable by our scale.   Labs independently reviewed: 03/2024 - albumin  4.0, alkaline phosphatase 186, AST/ALT normal, BUN 20, serum creatinine 1.29, potassium 3.8 02/2024 - AST 50 (164), ALT 44 (96), alk phos 441 (250), Hgb 10.8, PLT 194, TC 130, TG 116, HDL 63, LDL 44, TSH 5.536, A1c 6.1    Past Medical History:  Diagnosis Date   (HFpEF) heart failure with preserved ejection fraction (HCC)    a. 07/2019 Echo: EF 60-65%, mod LVH. Sev dil LA, mildly dil RA. Mod elev PASP; b. 07/2020 TEE: EF 50-55%, no rwma, sev conc LVH. Nl RV fxn. Mod dil RA. Mild-mod MR, mod TR.   Agatston coronary artery calcium  score between 200 and 399    a. 07/2020 Cardiac CT: Cor Ca2+ = 338 (99th %'ile).   Complication of anesthesia 20 years ago   a. spinal with first c section went too high stopped breathing, low bp with 2nd c section, no further issues with anesthesia   Heart murmur    a. 07/2019 Echo: no significant valvular dzs; b. 07/2020 TEE: Mild-mod MR, mod TR.   History of hiatal hernia    Hypertrophic cardiomyopathy (HCC)    a. 07/2019 Echo: Mod LVH; b. 01/2020 cMRI: EF 67%, HCM w/o obstruction. Max wall thickness 19mm. Sev LAE w/ L->R atrial level shunt. Large area of LGE @ mid-ventricular level in area of max wall thickness; c. 07/2020 Cardiac CT: Asymm hypertrophy up to 19mm in mid inferoseptum consistent w/ known  HCM.   Morbid obesity (HCC)    a. 09/2014 s/p gastric bypass.   Persistent atrial fibrillation (HCC)    a. Dx 07/2019-->CHA2DS2VASc = 2 (diast CHF/Fem)-->Eliquis ; b. 09/2018 s/p DCCV (150J (biphasic) x 1); c. 12/2019 RFCA/PVI; d. 07/2020 repeat RFCA/PVI.   Rash    both arms from old bed bug bites, healing   Stroke [facility])    Tobacco abuse    Typical atrial flutter (HCC)    a. 12/2019 s/p RFCA.    Past Surgical History:  Procedure Laterality Date   ABDOMINAL HYSTERECTOMY     ANKLE SURGERY      left x 2   ATRIAL FIBRILLATION ABLATION N/A 12/24/2019   Procedure: ATRIAL FIBRILLATION ABLATION;  Surgeon: Inocencio Soyla Lunger, MD;  Location: [facility] INVASIVE CV LAB;  Service: Cardiovascular;  Laterality: N/A;   ATRIAL FIBRILLATION ABLATION N/A 08/11/2020   Procedure: ATRIAL FIBRILLATION ABLATION;  Surgeon: Inocencio Soyla Lunger, MD;  Location: [facility] INVASIVE CV LAB;  Service: Cardiovascular;  Laterality: N/A;   CARDIOVERSION N/A 09/24/2019   Procedure: CARDIOVERSION;  Surgeon: Perla Evalene PARAS, MD;  Location: [facility] ORS;  Service: Cardiovascular;  Laterality: N/A;   CARDIOVERSION N/A 10/17/2019   Procedure: CARDIOVERSION;  Surgeon: Perla Evalene PARAS, MD;  Location: [facility] ORS;  Service: Cardiovascular;  Laterality: N/A;   CARDIOVERSION N/A 11/08/2021   Procedure: CARDIOVERSION;  Surgeon: Perla Evalene PARAS, MD;  Location: [facility] ORS;  Service: Cardiovascular;  Laterality: N/A;   CESAREAN SECTION     x 3   CHOLECYSTECTOMY, LAPAROSCOPIC  11/2023   CLIPPING OF ATRIAL APPENDAGE N/A 03/06/2022   Procedure: CLIPPING OF ATRIAL APPENDAGE USING ATRICURE  CLIP SIZE ;  Surgeon: Kerrin Elspeth BROCKS, MD;  Location: [facility] OR;  Service: Open Heart Surgery;  Laterality: N/A;   EXCISION OF ATRIAL MYXOMA N/A 03/06/2022   Procedure: RESECTION OF AORTIC VALVE TUMOR;  Surgeon: Kerrin Elspeth BROCKS, MD;  Location: [facility] OR;  Service: Open Heart Surgery;  Laterality: N/A;   HERNIA REPAIR     x2 one was for sure a hiatal hernia   ICD IMPLANT N/A 12/15/2022   Procedure: ICD IMPLANT;  Surgeon: Inocencio Soyla Lunger, MD;  Location: [facility] INVASIVE CV LAB;  Service: Cardiovascular;  Laterality: N/A;   IR ANGIO EXTRACRAN SEL COM CAROTID INNOMINATE UNI L MOD SED  11/13/2021   IR CT HEAD LTD  11/13/2021   IR PERCUTANEOUS ART THROMBECTOMY/INFUSION INTRACRANIAL INC DIAG ANGIO  11/13/2021   IR THORACENTESIS ASP PLEURAL SPACE W/IMG GUIDE  03/14/2022   LAPAROSCOPIC GASTRIC SLEEVE RESECTION  2020   LAPAROSCOPIC GASTRIC SLEEVE RESECTION  WITH HIATAL HERNIA REPAIR  08/17/2015   Procedure: LAPAROSCOPIC GASTRIC SLEEVE RESECTION WITH HIATAL HERNIA REPAIR;  Surgeon: Donnice Lunger, MD;  Location: [facility] ORS;  Service: General;;   LEFT HEART CATH AND CORONARY ANGIOGRAPHY N/A 10/30/2022   Procedure: LEFT HEART CATH AND CORONARY ANGIOGRAPHY;  Surgeon: Darron Deatrice LABOR, MD;  Location: [facility] INVASIVE CV LAB;  Service: Cardiovascular;  Laterality: N/A;   MAZE N/A 03/06/2022   Procedure: MAZE;  Surgeon: Kerrin Elspeth BROCKS, MD;  Location: [facility] OR;  Service: Open Heart Surgery;  Laterality: N/A;   RADIOLOGY WITH ANESTHESIA N/A 11/12/2021   Procedure: IR WITH ANESTHESIA;  Surgeon: Dolphus Carrion, MD;  Location: [facility] OR;  Service: Radiology;  Laterality: N/A;   TEE WITHOUT CARDIOVERSION N/A 08/11/2020   Procedure: TRANSESOPHAGEAL ECHOCARDIOGRAM (TEE);  Surgeon: Inocencio Soyla Lunger, MD;  Location: [facility] INVASIVE CV LAB;  Service: Cardiovascular;  Laterality: N/A;   TEE WITHOUT CARDIOVERSION N/A  11/08/2021   Procedure: TRANSESOPHAGEAL ECHOCARDIOGRAM (TEE);  Surgeon: Perla Evalene PARAS, MD;  Location: [facility] ORS;  Service: Cardiovascular;  Laterality: N/A;   TEE WITHOUT CARDIOVERSION N/A 03/06/2022   Procedure: TRANSESOPHAGEAL ECHOCARDIOGRAM (TEE);  Surgeon: Kerrin Elspeth BROCKS, MD;  Location: [facility] OR;  Service: Open Heart Surgery;  Laterality: N/A;   TUBAL LIGATION     x 2   UPPER GI ENDOSCOPY  08/17/2015   Procedure: UPPER GI ENDOSCOPY;  Surgeon: Donnice Lunger, MD;  Location: [facility] ORS;  Service: General;;    Current Medications: Current Meds  Medication Sig   amiodarone  (PACERONE ) 100 MG tablet Take 1 tablet (100 mg total) by mouth daily.   aspirin  EC 81 MG tablet Take 1 tablet (81 mg total) by mouth daily. Swallow whole.   b complex vitamins capsule Take 1 capsule by mouth daily.   busPIRone (BUSPAR) 5 MG tablet Take 5 mg by mouth 2 (two) times daily.   cholecalciferol  (VITAMIN D3) 25 MCG (1000 UNIT) tablet Take 1,000 Units by mouth daily.    estradiol (ESTRACE) 0.1 MG/GM vaginal cream Place 1 Applicatorful vaginally daily.   gabapentin (NEURONTIN) 100 MG capsule Take 100 mg by mouth at bedtime.   nortriptyline  (PAMELOR ) 25 MG capsule Take 1 capsule (25 mg total) by mouth at bedtime.   Omega-3 Fatty Acids (FISH OIL) 1000 MG CAPS Take 100 mg by mouth daily.   spironolactone  (ALDACTONE ) 25 MG tablet Take 0.5 tablets (12.5 mg total) by mouth daily.   STUDY - LIBREXIA-AF - apixaban  5 mg or placebo capsule (PI-Sethi) Take 1 capsule (5 mg total) by mouth 2 (two) times daily. Take at approximately the same time of day with or without food. Please bring bottle back with you to every visit; do not discard bottle. For Investigational Use only.   STUDY - LIBREXIA-AF - [id number] (Milvexian) 100 mg or placebo tablet (PI-Sethi) Take 1 tablet by mouth 2 (two) times daily. Take at approximately the same time of day with or without food. Please bring bottle back with you to every visit; do not discard bottle. For Investigational Use only.   torsemide  (DEMADEX ) 20 MG tablet Take 2 tablets (40 mg total) by mouth daily.   ursodiol (ACTIGALL) 300 MG capsule Take 300 mg by mouth 3 (three) times daily.    Allergies:   Atorvastatin , Atarax [hydroxyzine], Codeine, and Hydrocodone   Social History   Socioeconomic History   Marital status: Significant Other    Spouse name: Not on file   Number of children: 3   Years of education: two years of college   Highest education level: Not on file  Occupational History   Occupation: office assistant  Tobacco Use   Smoking status: Former    Current packs/day: 0.00    Average packs/day: 0.3 packs/day for 31.0 years (7.8 ttl pk-yrs)    Types: Cigarettes    Start date: 10/04/1991    Quit date: 10/03/2022    Years since quitting: 1.5    Passive exposure: Current   Smokeless tobacco: Never  Vaping Use   Vaping status: Every Day   Substances: Nicotine   Substance and Sexual Activity   Alcohol use: Yes     Alcohol/week: 1.0 - 2.0 standard drink of alcohol    Types: 1 - 2 Standard drinks or equivalent per week    Comment: Occaional use - 1-2 mixed drinks prior to stroke since d/c nothing 11/30/21   Drug use: No   Sexual activity: Yes  Other Topics Concern  Not on file  Social History Narrative   Lives at home with significant other.   Some caffeine daily I her green tea.   Right-handed.   Social Drivers of Corporate investment banker Strain: Low Risk  (04/01/2024)   Received from [facility]   Overall Financial Resource Strain (CARDIA)    Difficulty of Paying Living Expenses: Not very hard  Food Insecurity: No Food Insecurity (04/01/2024)   Received from [facility]   Hunger Vital Sign    Within the past 12 months, you worried that your food would run out before you got the money to buy more.: Never true    Within the past 12 months, the food you bought just didn't last and you didn't have money to get more.: Never true  Recent Concern: Food Insecurity - Food Insecurity Present (03/17/2024)   Received from [facility]   Hunger Vital Sign    Within the past 12 months, you worried that your food would run out before you got the money to buy more.: Sometimes true    Within the past 12 months, the food you bought just didn't last and you didn't have money to get more.: Never true  Transportation Needs: No Transportation Needs (04/01/2024)   Received from [facility] - Transportation    In the past 12 months, has lack of transportation kept you from medical appointments or from getting medications?: No    Lack of Transportation (Non-Medical): No  Physical Activity: Insufficiently Active (03/05/2023)   Received from [facility]   Exercise Vital Sign    On average, how many days per week do you engage in moderate to strenuous exercise (like a brisk walk)?: 7 days    On average, how many minutes  do you engage in exercise at this level?: 20 min  Stress: Stress Concern Present (03/05/2023)   Received from [facility] - Occupational Stress Questionnaire    Feeling of Stress : To some extent  Social Connections: Moderately Isolated (03/05/2023)   Received from [facility]   Social Connection and Isolation Panel    In a typical week, how many times do you talk on the phone with family, friends, or neighbors?: More than three times a week    How often do you get together with friends or relatives?: More than three times a week    How often do you attend church or religious services?: More than 4 times per year    Do you belong to any clubs or organizations such as church groups, unions, fraternal or athletic groups, or school groups?: No    How often do you attend meetings of the clubs or organizations you belong to?: Never    Are you married, widowed, divorced, separated, never married, or living with a partner?: Divorced     Family History:  The patient's family history includes Diabetes in her mother; Ehlers-Danlos syndrome in her daughter; Familial dysautonomia in her daughter; Heart failure in her mother; Hypertension in her mother. There is no history of Breast cancer.  ROS:   12-point review of systems is negative unless otherwise noted in the HPI.   EKGs/Labs/Other Studies Reviewed:    Studies reviewed were summarized above. The additional studies were reviewed today:  2D echo 04/16/2024: 1. Left ventricular ejection fraction, by estimation, is 45 to 50%. Left  ventricular ejection  fraction by PLAX is 47 %. The left ventricle has  mildly decreased function. The left ventricle has no regional wall motion  abnormalities. There is mild left  ventricular hypertrophy. Left ventricular diastolic parameters are  indeterminate. The average left ventricular global longitudinal strain is  -10.3 %. The  global longitudinal strain is abnormal.   2. Right ventricular systolic function is normal. The right ventricular  size is normal. There is severely elevated pulmonary artery systolic  pressure. The estimated right ventricular systolic pressure is 68.0 mmHg.   3. Left atrial size was severely dilated.   4. Right atrial size was mildly dilated.   5. The mitral valve is normal in structure. Moderate mitral valve  regurgitation. No evidence of mitral stenosis.   6. Tricuspid valve regurgitation is moderate to severe.   7. The aortic valve is normal in structure. Aortic valve regurgitation is  not visualized. No aortic stenosis is present.   8. The inferior vena cava is dilated in size with <50% respiratory  variability, suggesting right atrial pressure of 15 mmHg.  __________  Zio patch 10/2022: Predominant rhythm was sinus rhythm 2 VT episodes, longest 8 beats 69 SVT episodes, longest 12 seconds 585 pauses, longest 8.2 seconds Multiple episodes of junctional rhythm Pauses/junctional rhythm associated with triggered episodes 1.4% supraventricular ectopy 3.2% ventricular ectopy __________   LHC 10/30/2022:   Mid RCA to Dist RCA lesion is 20% stenosed.   1. mild nonobstructive coronary artery disease. 2.  Left ventricular angiography was not performed.  EF was normal by echo. 3.  Typical spike and dome appearance of left ventricular tracing consistent with hypertrophic cardiomyopathy.  Mild resting unprovoked LVOT obstruction with peak systolic gradient of 26 mmHg.   Recommendations: Suspect that elevated troponin is likely due to supply demand mismatch in the setting of hypertrophic cardiomyopathy likely with underlying uncontrolled tachycardia.  I am hesitant to add a beta-blocker given history of junctional bradycardia as well as documented 2.4-second pauses on telemetry.  I consulted Dr. 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 __________   2D echo 10/30/2022: 1. Left ventricular ejection fraction, by estimation, is 65 to 70%. The  left ventricle has normal function. The left ventricle has no regional  wall motion abnormalities. There is moderate asymmetric left ventricular  hypertrophy of the septal segment.  Left ventricular diastolic parameters are indeterminate. No significant  LVOT gradient even with Valsalva.   2. Right ventricular systolic function is normal. The right ventricular  size is normal. There is mildly elevated pulmonary artery systolic  pressure.   3. Left atrial size was mildly dilated.   4. Right atrial size was mildly dilated.   5. The mitral valve is normal in structure. Mild mitral valve  regurgitation. No evidence of mitral stenosis.   6. Tricuspid valve regurgitation is moderate.   7. The aortic valve is normal in structure. Aortic valve regurgitation is  not visualized. No aortic stenosis is present.   8. The inferior vena cava is normal in size with <50% respiratory  variability, suggesting right atrial pressure of 8 mmHg.  __________   Intraoperative TEE 03/06/2022: POST-OP IMPRESSIONS  _ Left Ventricle: has hyperdynamic systolic function, with an ejection  fraction  of 75%. The cavity size was decreased. The wall motion is normal.  _ Right Ventricle: normal function. The wall motion is normal.  _ Aorta: there is no dissection present in the aorta.  _ Aortic Valve: No stenosis present. There  is trace central regurgitation.  _ Mitral Valve: There is moderate regurgitation.  _ Tricuspid Valve: There is moderate regurgitation.   PRE-OP FINDINGS   Left Ventricle: The left ventricle has normal systolic function, with an  ejection fraction of 60-65%. The cavity size was normal. There is severe  concentric left ventricular hypertrophy.  __________   Vascular imaging 03/02/2022: Summary:  Right Carotid: Velocities in the right ICA are consistent with a 1-39%  stenosis.    Left Carotid: Velocities in the left ICA are consistent with a 1-39%  stenosis.  Vertebrals: Bilateral vertebral arteries demonstrate antegrade flow.  Subclavians: Normal flow hemodynamics were seen in bilateral subclavian               arteries.  __________   Coronary CTA 01/27/2022: Coronary calcium  score: The patient's coronary artery calcium  score is 393, which places the patient in the 99th percentile.   Coronary arteries: Normal coronary origins. Right dominance. Evaluation was limited as nitroglycerin  was not administered.   Right Coronary Artery: Dominant. Minimal mixed 1-24% proximal stenosis (CADRADS1).   Left Main Coronary Artery: Normal. Bifurcates into the LAD and LCx arteries.   Left Anterior Descending Coronary Artery: Minimal mixed proximal 1-24% stenosis (CADRADS1). The distal LAD is not visualized.   Left Circumflex Artery: Large AV groove vessel with minimal mixed 1-24% proximal stenosis (CADRADS1). There is a large distal OM1 branch without disease.   Aorta: Normal size, 30 mm at the mid ascending aorta (level of the PA bifurcation) measured double oblique. No calcifications. No dissection.   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   Other findings:   Normal pulmonary vein drainage into the left atrium.   Normal left atrial appendage without a thrombus.   Significantly dilated main pulmonary artery to 40 mm, suggestive of pulmonary hypertension.   Severe proximal ventricular septal thickening c/w diagnosis of HCM.   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   IMPRESSION: 1. Minimal mixed non-obstructive CAD, CADRADS = 1 (findings limited due to lack of nitroglycerin  administration).   2. Coronary calcium  score of 393. This was 99th percentile for age and sex matched control.   3. Normal coronary  origin with right dominance.   4. 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   5. Significantly dilated main pulmonary artery to 40 mm, suggestive of pulmonary hypertension.   6. Severe proximal ventricular septal thickening c/w diagnosis of HCM.   7. 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 __________   2D echo 11/13/2021: 1. Left ventricular ejection fraction, by estimation, is 60 to 65%. The  left ventricle has normal function. The left ventricle has no regional  wall motion abnormalities. There is severe concentric left ventricular  hypertrophy. Indeterminate diastolic  filling due to E-A fusion. Elevated left ventricular end-diastolic  pressure.   2. Right ventricular systolic function is normal. The right ventricular  size is moderately enlarged.   3. Right atrial size was moderately dilated.   4. Left atrial size was severely dilated.   5. The mitral valve is normal in structure. Mild mitral valve  regurgitation. No evidence of mitral stenosis.   6. There is a soft tissue density on the noncoronary cusp extending into  the LVOT. 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 Differential dx includes  endocarditis (in right clinical setting) or valvular myxmoa. Images from  TEE done 11/08/21 demonstrate the   same soft tissue density (images 34-37 and 42 on TEE). There is also a  prominent nodule of Arantius on the [facility] that is normal finding.   7. Recommend cMRI for further tissue evaluation of the aortic valve mass. __________   TEE 11/08/2021: 1. Left ventricular ejection fraction, by estimation, is 50-55 %. The  left ventricle has normal function. The left ventricle has no regional  wall motion abnormalities. There is mild concentric left ventricular  hypertrophy.   2. Right ventricular systolic function is normal. The right ventricular   size is normal.   3. Left atrial size was severely dilated. No left atrial/left atrial  appendage thrombus was detected.   4. Right atrial size was moderately dilated.   5. The mitral valve is normal in structure. No evidence of mitral valve  regurgitation. No evidence of mitral stenosis.   6. Tricuspid valve regurgitation is moderate.   7. The aortic valve is normal in structure. Aortic valve regurgitation is  not visualized. Aortic valve sclerosis is present, with no evidence of  aortic valve stenosis.   8. The inferior vena cava is normal in size with greater than 50%  respiratory variability, suggesting right atrial pressure of 3 mmHg.   9. Agitated saline contrast bubble study was negative, with no evidence  of any interatrial shunt.  ___________   TEE 08/07/2020: 1. Left ventricular ejection fraction, by estimation, is 50 to 55%. The  left ventricle has low normal function. The left ventricle has no regional  wall motion abnormalities. There is severe concentric left ventricular  hypertrophy. Left ventricular  diastolic function could not be evaluated.   2. Right ventricular systolic function is normal. The right ventricular  size is normal.   3. Left atrial size was severely dilated. No left atrial/left atrial  appendage thrombus was detected. The LAA emptying velocity was 36 cm/s.   4. Right atrial size was moderately dilated.   5. The mitral valve is grossly normal. Mild to moderate mitral valve  regurgitation.   6. Tricuspid valve regurgitation is moderate.   7. The aortic valve is tricuspid. There is mild calcification of the  aortic valve. There is mild thickening of the aortic valve. Aortic valve  regurgitation is not visualized.   8. The left upper pulmonary vein and left lower pulmonary vein are  abnormal.   9. Evidence of atrial level shunting detected by color flow Doppler.  __________   Cardiac MRI 01/28/2020: IMPRESSION: 1.  Normal LV chamber size, with  hyperdynamic function, LVEF 67%.   2.  Normal RV chamber size, RVEF 70%.   3. 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 Maximal wall thickness at the mid ventricular level is 19 mm.   4. Large area of dense delayed myocardial enhancement at the mid ventricular level in the area of maximal wall thickness. Visually appears > 15% of the myocardial mass.   5. Severe left atrial enlargement. Iatrogenic left to right atrial level shunt.   6.  No findings to suggests cardiac amyloidosis.  ECV 22%. __________   2D echo 07/31/2019: 1. Left ventricular ejection fraction, by visual estimation, is 60 to  65%. The left ventricle has normal function. There is moderately increased  left ventricular hypertrophy.   2. Left ventricular diastolic parameters are indeterminate.   3. Global right  ventricle has normal systolic function.The right  ventricular size is normal. No increase in right ventricular wall  thickness.   4. Left atrial size was severely dilated.   5. Right atrial size was mildly dilated   6. Moderately elevated pulmonary artery systolic pressure.   7. Rhythm is atrial fibrillation.    EKG:  EKG is ordered today.  The EKG ordered today demonstrates AV dual paced rhythm with prolonged AV conduction, 75 bpm  Recent Labs: 12/25/2023: ALT 24; Hemoglobin 10.9; Platelets 339 12/27/2023: TSH 3.390 04/16/2024: BUN 20; Creatinine, Ser 1.29; Potassium 3.8; Sodium 140  Recent Lipid Panel    Component Value Date/Time   CHOL 153 10/29/2022 1639   CHOL 165 08/02/2022 1507   TRIG 35 10/29/2022 1639   HDL 71 10/29/2022 1639   HDL 69 08/02/2022 1507   CHOLHDL 2.2 10/29/2022 1639   VLDL 7 10/29/2022 1639   LDLCALC 75 10/29/2022 1639   LDLCALC 83 08/02/2022 1507    PHYSICAL EXAM:    VS:  BP 108/78   Pulse 75   Ht 5' 4 (1.626 m)   Wt 151 lb (68.5 kg)   SpO2 97%   BMI 25.92 kg/m   BMI: Body mass index is 25.92 kg/m.  Physical  Exam Vitals reviewed.  Constitutional:      Appearance: She is well-developed.  HENT:     Head: Normocephalic and atraumatic.  Eyes:     General:        Right eye: No discharge.        Left eye: No discharge.  Cardiovascular:     Rate and Rhythm: Normal rate and regular rhythm.     Heart sounds: S1 normal and S2 normal. Heart sounds not distant. No midsystolic click and no opening snap. Murmur heard.     Systolic murmur is present at the upper left sternal border.     No friction rub.  Pulmonary:     Effort: Pulmonary effort is normal. No respiratory distress.     Breath sounds: Normal breath sounds. No decreased breath sounds, wheezing, rhonchi or rales.  Abdominal:     General: There is no distension.  Musculoskeletal:     Cervical back: Normal range of motion.     Right lower leg: No edema.     Left lower leg: No edema.  Skin:    General: Skin is warm and dry.     Nails: There is no clubbing.  Neurological:     Mental Status: She is alert and oriented to person, place, and time.  Psychiatric:        Speech: Speech normal.        Behavior: Behavior normal.        Thought Content: Thought content normal.        Judgment: Judgment normal.     Wt Readings from Last 3 Encounters:  04/30/24 151 lb (68.5 kg)  04/24/24 151 lb 6.4 oz (68.7 kg)  03/25/24 151 lb 9.6 oz (68.8 kg)     ASSESSMENT & PLAN:   HCM/HFpEF/pulmonary hypertension: Volume status and symptoms are improved on higher dose torsemide  40 mg daily and spironolactone  12.5 mg daily.  Recent echo showed preserved LV systolic function, normal RV systolic function, and elevated RVSP.  Check CMP to trend renal function and LFT to help guide diuresis.  If there is significant elevation in renal function, would need to consider RHC to further understand hemodynamics and guide pharmacotherapy.  Based on these results, may need to  revisit advanced heart failure referral.  Nonobstructive CAD: No symptoms suggestive of  angina.  Remains on aspirin  81 mg.  No longer on statin with liver dysfunction.  Pending further liver workup, may need to rechallenge with statin versus transitioning to PCSK9 inhibitor.  Persistent A-fib: Status post prior ablation x 2.  Maintained on amiodarone  per EP.  Remains on apixaban .  Follow-up with EP as directed.  History of VT: Status post ICD with no discharges or alarms.  Now on lower dose amiodarone  100 mg daily given abnormal LFTs as directed by EP.  Follow-up as directed.  Has previously not tolerated beta-blocker.  Abnormal LFTs: AST/ALT normalized with improving alkaline phosphatase on most recent labs.  Query if this was in the setting of amiodarone  use, congestive hepatopathy, or possible primary liver abnormality.  Trend CMP today.  She is scheduled for liver biopsy next week and has notified the drug study program for the need to interrupt pharmacotherapy.  Now on lower dose amiodarone  100 mg per EP.  History of CVA: No new deficits.  On aspirin  and apixaban .  Followed by neurology.  HTN: Blood pressure is well-controlled in the office today.    Disposition: F/u with Dr. Gollan or an APP in 1 month, and EP as directed.    Medication Adjustments/Labs and Tests Ordered: Current medicines are reviewed at length with the patient today.  Concerns regarding medicines are outlined above. Medication changes, Labs and Tests ordered today are summarized above and listed in the Patient Instructions accessible in Encounters.   Signed, Bernardino Bring, PA-C 04/30/2024 5:32 PM     [facility] [place] [address] [place], [place] [address] [phone]

## 2024-05-01 ENCOUNTER — Ambulatory Visit: Payer: Self-pay | Admitting: Physician Assistant

## 2024-05-01 DIAGNOSIS — Z79899 Other long term (current) drug therapy: Secondary | ICD-10-CM

## 2024-05-01 LAB — COMPREHENSIVE METABOLIC PANEL WITH GFR
ALT: 19 IU/L (ref 0–32)
AST: 29 IU/L (ref 0–40)
Albumin: 4.4 g/dL (ref 3.8–4.9)
Alkaline Phosphatase: 179 IU/L — ABNORMAL HIGH (ref 44–121)
BUN/Creatinine Ratio: 19 (ref 9–23)
BUN: 27 mg/dL — ABNORMAL HIGH (ref 6–24)
Bilirubin Total: 0.4 mg/dL (ref 0.0–1.2)
CO2: 28 mmol/L (ref 20–29)
Calcium: 9.5 mg/dL (ref 8.7–10.2)
Chloride: 97 mmol/L (ref 96–106)
Creatinine, Ser: 1.44 mg/dL — ABNORMAL HIGH (ref 0.57–1.00)
Globulin, Total: 2.8 g/dL (ref 1.5–4.5)
Glucose: 72 mg/dL (ref 70–99)
Potassium: 4 mmol/L (ref 3.5–5.2)
Sodium: 139 mmol/L (ref 134–144)
Total Protein: 7.2 g/dL (ref 6.0–8.5)
eGFR: 44 mL/min/1.73 — ABNORMAL LOW (ref >59)

## 2024-05-02 ENCOUNTER — Telehealth: Payer: Self-pay

## 2024-05-02 ENCOUNTER — Other Ambulatory Visit (HOSPITAL_COMMUNITY): Payer: Self-pay

## 2024-05-02 ENCOUNTER — Telehealth: Payer: Self-pay | Admitting: Cardiology

## 2024-05-02 NOTE — Telephone Encounter (Signed)
 Pharmacy Patient Advocate Encounter   Received notification from Patient Advice Request messages that prior authorization for Emgality  120mg /ml autoinjectors is required/requested.   Insurance verification completed.   The patient is insured through [facility] .   Per test claim: PA required; PA submitted to above mentioned insurance via Latent Key/confirmation #/EOC [id number] Status is pending

## 2024-05-02 NOTE — Telephone Encounter (Signed)
 Called to confirm/remind patient of their appointment at the [facility] on 05/05/24.   Appointment:   [x] Confirmed  [] Left mess   [] No answer/No voice mail  [] VM Full/unable to leave message  [] Phone not in service  Patient reminded to bring all medications and/or complete list.  Confirmed patient has transportation. Gave directions, instructed to utilize valet parking.

## 2024-05-05 ENCOUNTER — Other Ambulatory Visit (HOSPITAL_COMMUNITY): Payer: Self-pay

## 2024-05-05 ENCOUNTER — Other Ambulatory Visit: Payer: Self-pay | Admitting: Interventional Radiology

## 2024-05-05 ENCOUNTER — Ambulatory Visit (HOSPITAL_BASED_OUTPATIENT_CLINIC_OR_DEPARTMENT_OTHER): Admitting: Cardiology

## 2024-05-05 ENCOUNTER — Encounter: Payer: Self-pay | Admitting: Cardiology

## 2024-05-05 VITALS — BP 103/70 | HR 72 | Wt 155.0 lb

## 2024-05-05 DIAGNOSIS — Z8673 Personal history of transient ischemic attack (TIA), and cerebral infarction without residual deficits: Secondary | ICD-10-CM | POA: Diagnosis not present

## 2024-05-05 DIAGNOSIS — I5032 Chronic diastolic (congestive) heart failure: Secondary | ICD-10-CM | POA: Diagnosis not present

## 2024-05-05 DIAGNOSIS — E669 Obesity, unspecified: Secondary | ICD-10-CM | POA: Diagnosis not present

## 2024-05-05 DIAGNOSIS — Z79899 Other long term (current) drug therapy: Secondary | ICD-10-CM | POA: Diagnosis not present

## 2024-05-05 DIAGNOSIS — I130 Hypertensive heart and chronic kidney disease with heart failure and stage 1 through stage 4 chronic kidney disease, or unspecified chronic kidney disease: Secondary | ICD-10-CM | POA: Diagnosis not present

## 2024-05-05 DIAGNOSIS — I509 Heart failure, unspecified: Secondary | ICD-10-CM | POA: Diagnosis not present

## 2024-05-05 DIAGNOSIS — I421 Obstructive hypertrophic cardiomyopathy: Secondary | ICD-10-CM

## 2024-05-05 DIAGNOSIS — Z01818 Encounter for other preprocedural examination: Secondary | ICD-10-CM

## 2024-05-05 DIAGNOSIS — I480 Paroxysmal atrial fibrillation: Secondary | ICD-10-CM | POA: Diagnosis not present

## 2024-05-05 DIAGNOSIS — K761 Chronic passive congestion of liver: Secondary | ICD-10-CM | POA: Diagnosis present

## 2024-05-05 DIAGNOSIS — Z9049 Acquired absence of other specified parts of digestive tract: Secondary | ICD-10-CM | POA: Diagnosis not present

## 2024-05-05 DIAGNOSIS — R7989 Other specified abnormal findings of blood chemistry: Secondary | ICD-10-CM | POA: Diagnosis not present

## 2024-05-05 DIAGNOSIS — I2510 Atherosclerotic heart disease of native coronary artery without angina pectoris: Secondary | ICD-10-CM | POA: Diagnosis not present

## 2024-05-05 DIAGNOSIS — Z7982 Long term (current) use of aspirin: Secondary | ICD-10-CM | POA: Diagnosis not present

## 2024-05-05 DIAGNOSIS — Z9884 Bariatric surgery status: Secondary | ICD-10-CM | POA: Diagnosis not present

## 2024-05-05 DIAGNOSIS — I472 Ventricular tachycardia, unspecified: Secondary | ICD-10-CM | POA: Diagnosis not present

## 2024-05-05 DIAGNOSIS — Z7901 Long term (current) use of anticoagulants: Secondary | ICD-10-CM | POA: Diagnosis not present

## 2024-05-05 DIAGNOSIS — Z95810 Presence of automatic (implantable) cardiac defibrillator: Secondary | ICD-10-CM | POA: Diagnosis not present

## 2024-05-05 DIAGNOSIS — Z5941 Food insecurity: Secondary | ICD-10-CM | POA: Diagnosis not present

## 2024-05-05 DIAGNOSIS — F17290 Nicotine dependence, other tobacco product, uncomplicated: Secondary | ICD-10-CM | POA: Diagnosis not present

## 2024-05-05 DIAGNOSIS — I422 Other hypertrophic cardiomyopathy: Secondary | ICD-10-CM | POA: Diagnosis not present

## 2024-05-05 DIAGNOSIS — N183 Chronic kidney disease, stage 3 unspecified: Secondary | ICD-10-CM | POA: Diagnosis not present

## 2024-05-05 MED ORDER — SPIRONOLACTONE 25 MG PO TABS: 25.0000 mg | ORAL_TABLET | Freq: Every day | ORAL | 5 refills | Status: AC

## 2024-05-05 MED ORDER — TORSEMIDE 20 MG PO TABS: ORAL_TABLET | ORAL | 5 refills | Status: DC

## 2024-05-05 NOTE — Telephone Encounter (Signed)
 Pharmacy Patient Advocate Encounter  Received notification from [facility] that Prior Authorization for Emgality   has been APPROVED from 05/02/2024 to 05/02/2025. Ran test claim, Copay is $1,433.05. This test claim was processed through [facility]- copay amounts may vary at other pharmacies due to pharmacy/plan contracts, or as the patient moves through the different stages of their insurance plan.   PA #/Case ID/Reference #: [id number]   PT HAS VERY HIGH COPAY!!!!!!    I added an Emgality  savings card and copay is $35.00    Please provide PT with copay card information to give to her pharmacy-

## 2024-05-05 NOTE — Progress Notes (Signed)
 Patient for US  guided Liver Biopsy on Tues 05/06/24, I called and spoke with the patient on the phone and gave pre-procedure instructions. Pt was made aware to be here at 9a, last dose of ASA 81mg  was Fri 04/11/24, NPO after MN prior to procedure as well as driver post procedure/recovery/discharge. Pt stated understanding.  Called 04/28/24

## 2024-05-05 NOTE — Patient Instructions (Addendum)
 Medication Changes:  INCREASE SPIRONOLACTONE  TO 25 MG ONCE DAILY  INCREASE TORSEMIDE  TO 40 MG IN THE MORNING AND 20 MG IN THE AFTERNOON   Lab Work:  Go over to the [facility]. Go pass the gift shop and have your blood work completed IN 1 WEEK AFTER STARTING SPIRONOLACTONE   We will only call you if the results are abnormal or if the provider would like to make medication changes.  No news is good news.   Testing/Procedures:  Your provider has ordered a Right Heart Catheterization and a Genetic testing.  The nurse will be giving you a call tomorrow regarding information on your Right Heart Cath and your appointment.  Genetic testing has been collected, this has to be sent to [place]  for processing and can take 1-2 weeks for us  to get results back.  We will let you know the results once reviewed by your provider.    Follow-Up in: 1 MONTH WITH DR. ROLAN.   Thank you for choosing [facility].    At the [facility], you and your health needs are our priority. We have a designated team specialized in the treatment of Heart Failure. This Care Team includes your primary Heart Failure Specialized Cardiologist (physician), Advanced Practice Providers (APPs- Physician Assistants and Nurse Practitioners), and Pharmacist who all work together to provide you with the care you need, when you need it.   You may see any of the following providers on your designated Care Team at your next follow up:  Dr. Toribio Fuel Dr. Ezra ROLAN Dr. Ria Commander Dr. Morene Brownie Ellouise Class, FNP Jaun Bash, RPH-CPP  Please be sure to bring in all your medications bottles to every appointment.   Need to Contact Us :  If you have any questions or concerns before your next appointment please send us  a message through [facility] or call our office at [phone].    TO LEAVE A MESSAGE FOR THE NURSE SELECT OPTION 2, PLEASE LEAVE A  MESSAGE INCLUDING: YOUR NAME DATE OF BIRTH CALL BACK NUMBER REASON FOR CALL**this is important as we prioritize the call backs  YOU WILL RECEIVE A CALL BACK THE SAME DAY AS LONG AS YOU CALL BEFORE 4:00 PM

## 2024-05-05 NOTE — H&P (Signed)
 Chief Complaint: Patient was seen in consultation today for elevated LFTs.   Referring Physician(s): Anna,Kiran  Supervising Physician: Jenna Hacker  Patient Status: [facility] - Out-pt  History of Present Illness: Hannah Zimmerman is a 53 y.o. female with a medical history significant for hypertrophic cardiomyopathy with heart failure (ICD in place; amiodarone ), atrial fibrillation s/p ablation x 2, MAZE (aspirin /Eliquis ), morbid obesity s/p gastric bypass, stroke and elevated liver function tests. She was recently referred to GI for abnormal labs which have worsened since January 2025. An ultrasound doppler showed no abnormalities but did show increased pressures in the right heart system which can lead to congestive hepatopathy. It is likely her abnormal LFTs are due to a combination of heart failure and amiodarone  therapy.   Interventional Radiology has been asked to evaluate this patient for an image-guided non-focal liver biopsy for further work up.   Past Medical History:  Diagnosis Date   (HFpEF) heart failure with preserved ejection fraction (HCC)    a. 07/2019 Echo: EF 60-65%, mod LVH. Sev dil LA, mildly dil RA. Mod elev PASP; b. 07/2020 TEE: EF 50-55%, no rwma, sev conc LVH. Nl RV fxn. Mod dil RA. Mild-mod MR, mod TR.   Agatston coronary artery calcium  score between 200 and 399    a. 07/2020 Cardiac CT: Cor Ca2+ = 338 (99th %'ile).   Complication of anesthesia 20 years ago   a. spinal with first c section went too high stopped breathing, low bp with 2nd c section, no further issues with anesthesia   Heart murmur    a. 07/2019 Echo: no significant valvular dzs; b. 07/2020 TEE: Mild-mod MR, mod TR.   History of hiatal hernia    Hypertrophic cardiomyopathy (HCC)    a. 07/2019 Echo: Mod LVH; b. 01/2020 cMRI: EF 67%, HCM w/o obstruction. Max wall thickness 19mm. Sev LAE w/ L->R atrial level shunt. Large area of LGE @ mid-ventricular level in area of max wall thickness; c. 07/2020  Cardiac CT: Asymm hypertrophy up to 19mm in mid inferoseptum consistent w/ known HCM.   Morbid obesity (HCC)    a. 09/2014 s/p gastric bypass.   Persistent atrial fibrillation (HCC)    a. Dx 07/2019-->CHA2DS2VASc = 2 (diast CHF/Fem)-->Eliquis ; b. 09/2018 s/p DCCV (150J (biphasic) x 1); c. 12/2019 RFCA/PVI; d. 07/2020 repeat RFCA/PVI.   Rash    both arms from old bed bug bites, healing   Stroke [facility])    Tobacco abuse    Typical atrial flutter (HCC)    a. 12/2019 s/p RFCA.    Past Surgical History:  Procedure Laterality Date   ABDOMINAL HYSTERECTOMY     ANKLE SURGERY     left x 2   ATRIAL FIBRILLATION ABLATION N/A 12/24/2019   Procedure: ATRIAL FIBRILLATION ABLATION;  Surgeon: Inocencio Soyla Lunger, MD;  Location: [facility] INVASIVE CV LAB;  Service: Cardiovascular;  Laterality: N/A;   ATRIAL FIBRILLATION ABLATION N/A 08/11/2020   Procedure: ATRIAL FIBRILLATION ABLATION;  Surgeon: Inocencio Soyla Lunger, MD;  Location: [facility] INVASIVE CV LAB;  Service: Cardiovascular;  Laterality: N/A;   CARDIOVERSION N/A 09/24/2019   Procedure: CARDIOVERSION;  Surgeon: Perla Evalene PARAS, MD;  Location: [facility] ORS;  Service: Cardiovascular;  Laterality: N/A;   CARDIOVERSION N/A 10/17/2019   Procedure: CARDIOVERSION;  Surgeon: Perla Evalene PARAS, MD;  Location: [facility] ORS;  Service: Cardiovascular;  Laterality: N/A;   CARDIOVERSION N/A 11/08/2021   Procedure: CARDIOVERSION;  Surgeon: Perla Evalene PARAS, MD;  Location: [facility] ORS;  Service: Cardiovascular;  Laterality: N/A;  CESAREAN SECTION     x 3   CHOLECYSTECTOMY, LAPAROSCOPIC  11/2023   CLIPPING OF ATRIAL APPENDAGE N/A 03/06/2022   Procedure: CLIPPING OF ATRIAL APPENDAGE USING ATRICURE  CLIP SIZE ;  Surgeon: Kerrin Elspeth BROCKS, MD;  Location: [facility] OR;  Service: Open Heart Surgery;  Laterality: N/A;   EXCISION OF ATRIAL MYXOMA N/A 03/06/2022   Procedure: RESECTION OF AORTIC VALVE TUMOR;  Surgeon: Kerrin Elspeth BROCKS, MD;  Location: [facility] OR;  Service: Open Heart  Surgery;  Laterality: N/A;   HERNIA REPAIR     x2 one was for sure a hiatal hernia   ICD IMPLANT N/A 12/15/2022   Procedure: ICD IMPLANT;  Surgeon: Inocencio Soyla Lunger, MD;  Location: [facility] INVASIVE CV LAB;  Service: Cardiovascular;  Laterality: N/A;   IR ANGIO EXTRACRAN SEL COM CAROTID INNOMINATE UNI L MOD SED  11/13/2021   IR CT HEAD LTD  11/13/2021   IR PERCUTANEOUS ART THROMBECTOMY/INFUSION INTRACRANIAL INC DIAG ANGIO  11/13/2021   IR THORACENTESIS ASP PLEURAL SPACE W/IMG GUIDE  03/14/2022   LAPAROSCOPIC GASTRIC SLEEVE RESECTION  2020   LAPAROSCOPIC GASTRIC SLEEVE RESECTION WITH HIATAL HERNIA REPAIR  08/17/2015   Procedure: LAPAROSCOPIC GASTRIC SLEEVE RESECTION WITH HIATAL HERNIA REPAIR;  Surgeon: Donnice Lunger, MD;  Location: [facility] ORS;  Service: General;;   LEFT HEART CATH AND CORONARY ANGIOGRAPHY N/A 10/30/2022   Procedure: LEFT HEART CATH AND CORONARY ANGIOGRAPHY;  Surgeon: Darron Deatrice LABOR, MD;  Location: [facility] INVASIVE CV LAB;  Service: Cardiovascular;  Laterality: N/A;   MAZE N/A 03/06/2022   Procedure: MAZE;  Surgeon: Kerrin Elspeth BROCKS, MD;  Location: [facility] OR;  Service: Open Heart Surgery;  Laterality: N/A;   RADIOLOGY WITH ANESTHESIA N/A 11/12/2021   Procedure: IR WITH ANESTHESIA;  Surgeon: Dolphus Carrion, MD;  Location: [facility] OR;  Service: Radiology;  Laterality: N/A;   TEE WITHOUT CARDIOVERSION N/A 08/11/2020   Procedure: TRANSESOPHAGEAL ECHOCARDIOGRAM (TEE);  Surgeon: Inocencio Soyla Lunger, MD;  Location: [facility] INVASIVE CV LAB;  Service: Cardiovascular;  Laterality: N/A;   TEE WITHOUT CARDIOVERSION N/A 11/08/2021   Procedure: TRANSESOPHAGEAL ECHOCARDIOGRAM (TEE);  Surgeon: Perla Evalene PARAS, MD;  Location: [facility] ORS;  Service: Cardiovascular;  Laterality: N/A;   TEE WITHOUT CARDIOVERSION N/A 03/06/2022   Procedure: TRANSESOPHAGEAL ECHOCARDIOGRAM (TEE);  Surgeon: Kerrin Elspeth BROCKS, MD;  Location: [facility] OR;  Service: Open Heart Surgery;  Laterality: N/A;   TUBAL LIGATION     x 2    UPPER GI ENDOSCOPY  08/17/2015   Procedure: UPPER GI ENDOSCOPY;  Surgeon: Donnice Lunger, MD;  Location: [facility] ORS;  Service: General;;    Allergies: Atorvastatin , Atarax [hydroxyzine], Codeine, and Hydrocodone  Medications: Prior to Admission medications   Medication Sig Start Date End Date Taking? Authorizing Provider  ACCU-CHEK GUIDE TEST test strip 1 each by Other route as needed for other.    [provider]  amiodarone  (PACERONE ) 100 MG tablet Take 1 tablet (100 mg total) by mouth daily. 04/02/24   Riddle, Suzann, NP  aspirin  EC 81 MG tablet Take 1 tablet (81 mg total) by mouth daily. Swallow whole. 11/01/22   Leotis Bogus, MD  b complex vitamins capsule Take 1 capsule by mouth daily.    [provider]  busPIRone (BUSPAR) 5 MG tablet Take 5 mg by mouth 2 (two) times daily. 03/12/24 03/12/25  [provider]  cholecalciferol  (VITAMIN D3) 25 MCG (1000 UNIT) tablet Take 1,000 Units by mouth daily.    [provider]  estradiol (ESTRACE) 0.1 MG/GM vaginal cream Place  1 Applicatorful vaginally daily. 04/03/24   [provider]  gabapentin (NEURONTIN) 100 MG capsule Take 100 mg by mouth at bedtime.    [provider]  Galcanezumab -gnlm (EMGALITY ) 120 MG/ML SOAJ Inject 120 mg into the skin every 30 (thirty) days. Patient not taking: Reported on 04/30/2024 04/24/24   Whitfield Raisin, NP  nortriptyline  (PAMELOR ) 25 MG capsule Take 1 capsule (25 mg total) by mouth at bedtime. 04/24/24   Whitfield Raisin, NP  Omega-3 Fatty Acids (FISH OIL) 1000 MG CAPS Take 100 mg by mouth daily.    [provider]  spironolactone  (ALDACTONE ) 25 MG tablet Take 0.5 tablets (12.5 mg total) by mouth daily. 03/25/24 06/23/24  Abigail Bernardino HERO, PA-C  STUDY - LIBREXIA-AF - apixaban  5 mg or placebo capsule (PI-Sethi) Take 1 capsule (5 mg total) by mouth 2 (two) times daily. Take at approximately the same time of day with or without food. Please bring bottle back with you to  every visit; do not discard bottle. For Investigational Use only. 03/17/24   Sethi, Pramod S, MD  STUDY - LIBREXIA-AF - [id number] (Milvexian) 100 mg or placebo tablet (PI-Sethi) Take 1 tablet by mouth 2 (two) times daily. Take at approximately the same time of day with or without food. Please bring bottle back with you to every visit; do not discard bottle. For Investigational Use only. 03/17/24   Sethi, Pramod S, MD  torsemide  (DEMADEX ) 20 MG tablet Take 2 tablets (40 mg total) by mouth daily. 04/17/24   Abigail Bernardino HERO, PA-C  ursodiol (ACTIGALL) 300 MG capsule Take 300 mg by mouth 3 (three) times daily. 03/26/24 03/26/25  [provider]     Family History  Problem Relation Age of Onset   Hypertension Mother    Diabetes Mother    Heart failure Mother    Ehlers-Danlos syndrome Daughter    Familial dysautonomia Daughter    Breast cancer Neg Hx     Social History   Socioeconomic History   Marital status: Significant Other    Spouse name: Not on file   Number of children: 3   Years of education: two years of college   Highest education level: Not on file  Occupational History   Occupation: office assistant  Tobacco Use   Smoking status: Former    Current packs/day: 0.00    Average packs/day: 0.3 packs/day for 31.0 years (7.8 ttl pk-yrs)    Types: Cigarettes    Start date: 10/04/1991    Quit date: 10/03/2022    Years since quitting: 1.5    Passive exposure: Current   Smokeless tobacco: Never  Vaping Use   Vaping status: Every Day  Substance and Sexual Activity   Alcohol use: Yes    Alcohol/week: 1.0 - 2.0 standard drink of alcohol    Types: 1 - 2 Standard drinks or equivalent per week    Comment: Occaional use - 1-2 mixed drinks prior to stroke since d/c nothing 11/30/21   Drug use: No   Sexual activity: Yes  Other Topics Concern   Not on file  Social History Narrative   Lives at home with significant other.   Some caffeine daily I her green tea.   Right-handed.    Social Drivers of Corporate investment banker Strain: Low Risk  (04/01/2024)   Received from [facility]   Overall Financial Resource Strain (CARDIA)    Difficulty of Paying Living Expenses: Not very hard  Food Insecurity: No Food Insecurity (04/01/2024)  Received from [facility]   Hunger Vital Sign    Within the past 12 months, you worried that your food would run out before you got the money to buy more.: Never true    Within the past 12 months, the food you bought just didn't last and you didn't have money to get more.: Never true  Recent Concern: Food Insecurity - Food Insecurity Present (03/17/2024)   Received from [facility]   Hunger Vital Sign    Within the past 12 months, you worried that your food would run out before you got the money to buy more.: Sometimes true    Within the past 12 months, the food you bought just didn't last and you didn't have money to get more.: Never true  Transportation Needs: No Transportation Needs (04/01/2024)   Received from [facility] - Transportation    In the past 12 months, has lack of transportation kept you from medical appointments or from getting medications?: No    Lack of Transportation (Non-Medical): No  Physical Activity: Insufficiently Active (03/05/2023)   Received from [facility]   Exercise Vital Sign    On average, how many days per week do you engage in moderate to strenuous exercise (like a brisk walk)?: 7 days    On average, how many minutes do you engage in exercise at this level?: 20 min  Stress: Stress Concern Present (03/05/2023)   Received from [facility] - Occupational Stress Questionnaire    Feeling of Stress : To some extent  Social Connections: Moderately Isolated (03/05/2023)   Received from [facility]   Social Connection and Isolation  Panel    In a typical week, how many times do you talk on the phone with family, friends, or neighbors?: More than three times a week    How often do you get together with friends or relatives?: More than three times a week    How often do you attend church or religious services?: More than 4 times per year    Do you belong to any clubs or organizations such as church groups, unions, fraternal or athletic groups, or school groups?: No    How often do you attend meetings of the clubs or organizations you belong to?: Never    Are you married, widowed, divorced, separated, never married, or living with a partner?: Divorced    Review of Systems: A 12 point ROS discussed and pertinent positives are indicated in the HPI above.  All other systems are negative.  Review of Systems  All other systems reviewed and are negative.   Vital Signs: BP 112/80   Pulse 73   Temp 97.8 F (36.6 C) (Temporal)   Resp 15   Ht 5' 4 (1.626 m)   Wt 152 lb 11.2 oz (69.3 kg)   SpO2 99%   BMI 26.21 kg/m   Physical Exam Constitutional:      General: She is not in acute distress.    Appearance: She is not ill-appearing.  HENT:     Mouth/Throat:     Mouth: Mucous membranes are moist.     Pharynx: Oropharynx is clear.  Cardiovascular:     Rate and Rhythm: Normal rate.     Comments: Paced rhythm.  Pulmonary:     Effort: Pulmonary effort is normal.  Abdominal:     Tenderness: There is no  abdominal tenderness.  Skin:    General: Skin is warm and dry.  Neurological:     Mental Status: She is alert and oriented to person, place, and time.      Labs:  CBC: Recent Labs    12/25/23 1648 05/06/24 0922  WBC 7.1 7.2  HGB 10.9* 12.3  HCT 33.8* 37.0  PLT 339 244    COAGS: Recent Labs    05/06/24 0922  INR 1.0    BMP: Recent Labs    12/25/23 1648 02/26/24 1500 04/16/24 1002 04/30/24 1146  NA 138 140 140 139  K 4.5 4.2 3.8 4.0  CL 101 100 102 97  CO2 29 23 23 28   GLUCOSE 94 107* 60*  72  BUN 31* 21 20 27*  CALCIUM  9.0 8.8 9.0 9.5  CREATININE 1.31* 1.45* 1.29* 1.44*  GFRNONAA 49*  --   --   --     LIVER FUNCTION TESTS: Recent Labs    12/25/23 1648 04/30/24 1146  BILITOT 0.5 0.4  AST 34 29  ALT 24 19  ALKPHOS 124 179*  PROT 7.3 7.2  ALBUMIN  3.5 4.4    TUMOR MARKERS: No results for input(s): AFPTM, CEA, CA199, CHROMGRNA in the last 8760 hours.  Assessment and Plan:  Elevated LFTs: Hannah Zimmerman, 53 year old female, presents today to the [facility] Interventional Radiology department for an image-guided non-focal liver biopsy.   Risks and benefits of this procedure were discussed with the patient and/or patient's family including, but not limited to bleeding, infection, damage to adjacent structures or low yield requiring additional tests.  All of the questions were answered and there is agreement to proceed. She has been NPO. She is a participant in a drug trial and is currently either taking Eliquis  or Labrexa plus 81 mg aspirin  daily. These drugs have been held for 5 days.   Consent signed and in chart. She has been NPO.   Thank you for this interesting consult.  I greatly enjoyed meeting Hannah Zimmerman and look forward to participating in their care.  A copy of this report was sent to the requesting provider on this date.  Electronically Signed: Warren Dais, AGACNP-BC 05/06/2024, 10:03 AM   I spent a total of  30 Minutes   in face to face in clinical consultation, greater than 50% of which was counseling/coordinating care for elevated LFTs.

## 2024-05-06 ENCOUNTER — Ambulatory Visit
Admission: RE | Admit: 2024-05-06 | Discharge: 2024-05-06 | Disposition: A | Discharge status: Home / Self Care | Payer: Self-pay | Source: Ambulatory Visit | Attending: Gastroenterology | Admitting: Gastroenterology

## 2024-05-06 ENCOUNTER — Other Ambulatory Visit: Payer: Self-pay

## 2024-05-06 DIAGNOSIS — Z9884 Bariatric surgery status: Secondary | ICD-10-CM | POA: Insufficient documentation

## 2024-05-06 DIAGNOSIS — N183 Chronic kidney disease, stage 3 unspecified: Secondary | ICD-10-CM | POA: Insufficient documentation

## 2024-05-06 DIAGNOSIS — E669 Obesity, unspecified: Secondary | ICD-10-CM | POA: Insufficient documentation

## 2024-05-06 DIAGNOSIS — Z7982 Long term (current) use of aspirin: Secondary | ICD-10-CM | POA: Insufficient documentation

## 2024-05-06 DIAGNOSIS — Z9049 Acquired absence of other specified parts of digestive tract: Secondary | ICD-10-CM | POA: Insufficient documentation

## 2024-05-06 DIAGNOSIS — I480 Paroxysmal atrial fibrillation: Secondary | ICD-10-CM | POA: Insufficient documentation

## 2024-05-06 DIAGNOSIS — I422 Other hypertrophic cardiomyopathy: Secondary | ICD-10-CM | POA: Insufficient documentation

## 2024-05-06 DIAGNOSIS — Z01818 Encounter for other preprocedural examination: Secondary | ICD-10-CM

## 2024-05-06 DIAGNOSIS — Z8673 Personal history of transient ischemic attack (TIA), and cerebral infarction without residual deficits: Secondary | ICD-10-CM | POA: Insufficient documentation

## 2024-05-06 DIAGNOSIS — K761 Chronic passive congestion of liver: Secondary | ICD-10-CM | POA: Insufficient documentation

## 2024-05-06 DIAGNOSIS — I130 Hypertensive heart and chronic kidney disease with heart failure and stage 1 through stage 4 chronic kidney disease, or unspecified chronic kidney disease: Secondary | ICD-10-CM | POA: Insufficient documentation

## 2024-05-06 DIAGNOSIS — I472 Ventricular tachycardia, unspecified: Secondary | ICD-10-CM | POA: Insufficient documentation

## 2024-05-06 DIAGNOSIS — Z7901 Long term (current) use of anticoagulants: Secondary | ICD-10-CM | POA: Insufficient documentation

## 2024-05-06 DIAGNOSIS — Z79899 Other long term (current) drug therapy: Secondary | ICD-10-CM | POA: Insufficient documentation

## 2024-05-06 DIAGNOSIS — F17290 Nicotine dependence, other tobacco product, uncomplicated: Secondary | ICD-10-CM | POA: Insufficient documentation

## 2024-05-06 DIAGNOSIS — Z5941 Food insecurity: Secondary | ICD-10-CM | POA: Insufficient documentation

## 2024-05-06 DIAGNOSIS — R7989 Other specified abnormal findings of blood chemistry: Secondary | ICD-10-CM | POA: Insufficient documentation

## 2024-05-06 DIAGNOSIS — Z95810 Presence of automatic (implantable) cardiac defibrillator: Secondary | ICD-10-CM | POA: Insufficient documentation

## 2024-05-06 DIAGNOSIS — I509 Heart failure, unspecified: Secondary | ICD-10-CM | POA: Insufficient documentation

## 2024-05-06 DIAGNOSIS — I2510 Atherosclerotic heart disease of native coronary artery without angina pectoris: Secondary | ICD-10-CM | POA: Insufficient documentation

## 2024-05-06 LAB — CBC
HCT: 37 % (ref 36.0–46.0)
Hemoglobin: 12.3 g/dL (ref 12.0–15.0)
MCH: 29.1 pg (ref 26.0–34.0)
MCHC: 33.2 g/dL (ref 30.0–36.0)
MCV: 87.5 fL (ref 80.0–100.0)
Platelets: 244 K/uL (ref 150–400)
RBC: 4.23 MIL/uL (ref 3.87–5.11)
RDW: 19.7 % — ABNORMAL HIGH (ref 11.5–15.5)
WBC: 7.2 K/uL (ref 4.0–10.5)
nRBC: 0 % (ref 0.0–0.2)

## 2024-05-06 LAB — PROTIME-INR
INR: 1 (ref 0.8–1.2)
Prothrombin Time: 13.8 s (ref 11.4–15.2)

## 2024-05-06 MED ORDER — MIDAZOLAM HCL 2 MG/2ML IJ SOLN
INTRAMUSCULAR | Status: AC | PRN
  Administered 2024-05-06: .5 mg via INTRAVENOUS
  Administered 2024-05-06: 1 mg via INTRAVENOUS
  Administered 2024-05-06: .5 mg via INTRAVENOUS

## 2024-05-06 MED ORDER — MIDAZOLAM HCL 2 MG/2ML IJ SOLN
INTRAMUSCULAR | Status: AC
  Filled 2024-05-06: qty 4

## 2024-05-06 MED ORDER — FENTANYL CITRATE (PF) 100 MCG/2ML IJ SOLN
INTRAMUSCULAR | Status: AC
  Filled 2024-05-06: qty 4

## 2024-05-06 MED ORDER — LIDOCAINE 1 % OPTIME INJ - NO CHARGE
10.0000 mL | Freq: Once | INTRAMUSCULAR | Status: AC
  Administered 2024-05-06: 10 mL via INTRADERMAL
  Filled 2024-05-06: qty 10

## 2024-05-06 MED ORDER — FENTANYL CITRATE (PF) 100 MCG/2ML IJ SOLN
INTRAMUSCULAR | Status: AC | PRN
  Administered 2024-05-06: 25 ug via INTRAVENOUS
  Administered 2024-05-06: 50 ug via INTRAVENOUS
  Administered 2024-05-06: 25 ug via INTRAVENOUS

## 2024-05-06 MED ORDER — LIDOCAINE HCL (PF) 1 % IJ SOLN
10.0000 mL | Freq: Once | INTRAMUSCULAR | Status: DC
  Filled 2024-05-06: qty 10

## 2024-05-06 MED ORDER — ONDANSETRON HCL 4 MG/2ML IJ SOLN
4.0000 mg | Freq: Once | INTRAMUSCULAR | Status: AC
  Administered 2024-05-06: 4 mg via INTRAVENOUS

## 2024-05-06 MED ORDER — SODIUM CHLORIDE 0.9 % IV SOLN: INTRAVENOUS | Status: DC

## 2024-05-06 MED ORDER — ONDANSETRON HCL 4 MG/2ML IJ SOLN
INTRAMUSCULAR | Status: AC
  Filled 2024-05-06: qty 2

## 2024-05-06 NOTE — Discharge Instructions (Signed)
 Liver Biopsy, Care After These instructions give you information about how to care for yourself after your procedure. Your health care provider may also give you more specific instructions. If you have problems or questions, contact your health care provider. What can I expect after the procedure? After your procedure, it is common to have: Pain and soreness in the area where the biopsy was done. Bruising around the area where the biopsy was done. Sleepiness and fatigue for 1-2 days. Follow these instructions at home: Medicines Take over-the-counter and prescription medicines only as told by your health care provider. If you were prescribed an antibiotic medicine, take it as told by your health care provider. Do not stop taking the antibiotic even if you start to feel better. Do not take medicines such as aspirin and ibuprofen unless your health care provider tells you to take them. These medicines thin your blood and can increase the risk of bleeding. If you are taking prescription pain medicine, take actions to prevent or treat constipation. Your health care provider may recommend that you: Drink enough fluid to keep your urine pale yellow. Eat foods that are high in fiber, such as fresh fruits and vegetables, whole grains, and beans. Limit foods that are high in fat and processed sugars, such as fried or sweet foods. Take an over-the-counter or prescription medicine for constipation. Incision care Wash your hands with soap and water before you change your bandage (dressing). If soap and water are not available, use hand sanitizer. Change your bandage tomorrow after you shower and then remove the next day. Check your incision area every day for signs of infection. Check for: Redness, swelling, or pain. Fluid or blood. Warmth. Pus or a bad smell. You may shower tomorrow No lifting more than 5 lbs for 3 days Return to your normal activities as told by your health care provider.  Do not  drive or use heavy machinery for 24hrs or while taking prescription pain medicine. Do not play contact sports for 2 weeks after the procedure. General instructions  Do not drink alcohol in the first week after the procedure. Have someone stay with you for at least 24 hours after the procedure. It is your responsibility to obtain your test results. Ask your health care provider, or the department that is doing the test: When will my results be ready? How will I get my results? What are my treatment options? What other tests do I need? What are my next steps? Keep all follow-up visits as told by your health care provider. This is important. Contact a health care provider if: You have increased bleeding from an incision, resulting in more than a small spot of blood. You have redness, swelling, or increasing pain in any incisions. You notice a discharge or a bad smell coming from any of your incisions. You have a fever or chills. Get help right away if: You develop swelling, bloating, or pain in your abdomen. You become dizzy or faint. You develop a rash. You have nausea or you vomit. You faint, or you have shortness of breath or difficulty breathing. You develop chest pain. You have problems with your speech or vision. You have trouble with your balance or moving your arms or legs. Summary After the liver biopsy, it is common to have pain, soreness, and bruising in the area, as well as sleepiness and fatigue. Take over-the-counter and prescription medicines only as told by your health care provider. Follow instructions from your health care provider about how  to care for your incision. Check the incision area daily for signs of infection. This information is not intended to replace advice given to you by your health care provider. Make sure you discuss any questions you have with your health care provider. Document Released: 03/24/2005 Document Revised: 10/28/2018 Document Reviewed:  09/14/2017 Elsevier Patient Education  2020 [employer].

## 2024-05-06 NOTE — H&P (View-Only) (Signed)
 PCP: Cyrus Selinda Moose, PA-C Cardiology: Dr. Perla HF Cardiology: Dr. Rolan  Chief complaint: CHF  53 y.o. with history of hypertrophic cardiomyopathy, VT, bradycardia with dual chamber Medtronic ICD, paroxysmal atrial fibrillation, papillary fibroelastoma s/p resection was referred by Dr. Gollan for CHF evaluation.  Patient carries history of HCM, in the past imaging has shown nonobstructive HCM.  She tells me that her mother has HCM and had positive gene testing though she does not know which gene (it was not done through the [facility] system).  She had a cardiac MRI in 5/21 that showed LV EF 67%, moderate asymmetric hypertrophy of the mid septum, large area of mid-wall LGE in the mid septum, ECV 22%, RV EF 70%. Last echo in 7/25 showed EF 45-50%, mild LVH, normal RV, PASP 68 mmHg, severe LAE, moderate MR, IVC dilated.  No LVOT gradient or SAM reported.  Cath in 2/24 showed no significant CAD.  She had AF ablations in 4/21 and 11/22.  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  She had surgery in 2023 with fibroelastoma excision as well as Maze and LA appendage clip.  She was noted to have junctional bradycardia and long pauses in 3/24, Medtronic dual chamber PPM was placed. Recently, alkaline phosphatase has been elevated, and she is planned for a liver biopsy this month.   She reports dyspnea and fatigue with inclines and stairs.  She does ok walking on flat ground and can even get around [place].  No chest pain or lightheadedness.  No syncope.  Diuretic dose has been increased recently. She feels like her exercise tolerance has worsened recently.   ECG (04/30/24, personally reviewed): A-V sequential pacing  Labs (6/25): LDL 49 Labs (8/25): K 4, creatinine 1.44, AST/ALT normal, alkaline phosphatase 179  Medtronic device interrogation: 98% v-pacing, 100% a-pacing, thoracic impedance starting to trend down.   PMH: 1. VT/NSVT: Medtronic dual chamber  ICD.  2. Atrial fibrillation: S/p ablations in 4/21 and 11/22, s/p Maze and LA appendage clip in 2023.  3. Papillary fibroelastoma: Aortic valve, s/p surgical excision in 2023.  The fibroelastoma was associated with CVA.  4. Bradycardia: With junctional rhythm and pauses up to 8.2 seconds.   - Medtronic dual chamber ICD placed in 3/24.  5. Obesity: Gastric bypass in 2016.  6. HTN 7. 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  8. Hypertrophic cardiomyopathy: Mother has gene+ HCM.   - Cardiac MRI (5/21): LV EF 67%, moderate asymmetric hypertrophy of the mid septum, large area of mid-wall LGE in the mid septum, ECV 22%, RV EF 70%.  - Echo (7/25): EF 45-50%, mild LVH, normal RV, PASP 68 mmHg, severe LAE, moderate MR, IVC dilated.  No LVOT gradient or SAM reported.  9. Elevated LFTs.  10. CKD stage 3  FH: Mother with HCM, gene+.  Daughter with POTS.   Social History   Socioeconomic History   Marital status: Significant Other    Spouse name: Not on file   Number of children: 3   Years of education: two years of college   Highest education level: Not on file  Occupational History   Occupation: office assistant  Tobacco Use   Smoking status: Former    Current packs/day: 0.00    Average packs/day: 0.3 packs/day for 31.0 years (7.8 ttl pk-yrs)    Types: Cigarettes    Start date: 10/04/1991    Quit date: 10/03/2022    Years since quitting: 1.5    Passive exposure:  Current   Smokeless tobacco: Never  Vaping Use   Vaping status: Every Day  Substance and Sexual Activity   Alcohol use: Yes    Alcohol/week: 1.0 - 2.0 standard drink of alcohol    Types: 1 - 2 Standard drinks or equivalent per week    Comment: Occaional use - 1-2 mixed drinks prior to stroke since d/c nothing 11/30/21   Drug use: No   Sexual activity: Yes  Other Topics Concern   Not on file  Social History Narrative   Lives at home with significant other.   Some caffeine daily I her green tea.   Right-handed.   Social  Drivers of Corporate investment banker Strain: Low Risk  (04/01/2024)   Received from [facility]   Overall Financial Resource Strain (CARDIA)    Difficulty of Paying Living Expenses: Not very hard  Food Insecurity: No Food Insecurity (04/01/2024)   Received from [facility]   Hunger Vital Sign    Within the past 12 months, you worried that your food would run out before you got the money to buy more.: Never true    Within the past 12 months, the food you bought just didn't last and you didn't have money to get more.: Never true  Recent Concern: Food Insecurity - Food Insecurity Present (03/17/2024)   Received from [facility]   Hunger Vital Sign    Within the past 12 months, you worried that your food would run out before you got the money to buy more.: Sometimes true    Within the past 12 months, the food you bought just didn't last and you didn't have money to get more.: Never true  Transportation Needs: No Transportation Needs (04/01/2024)   Received from [facility] - Transportation    In the past 12 months, has lack of transportation kept you from medical appointments or from getting medications?: No    Lack of Transportation (Non-Medical): No  Physical Activity: Insufficiently Active (03/05/2023)   Received from [facility]   Exercise Vital Sign    On average, how many days per week do you engage in moderate to strenuous exercise (like a brisk walk)?: 7 days    On average, how many minutes do you engage in exercise at this level?: 20 min  Stress: Stress Concern Present (03/05/2023)   Received from [facility] - Occupational Stress Questionnaire    Feeling of Stress : To some extent  Social Connections: Moderately Isolated (03/05/2023)   Received from [facility]   Social Connection and Isolation Panel    In  a typical week, how many times do you talk on the phone with family, friends, or neighbors?: More than three times a week    How often do you get together with friends or relatives?: More than three times a week    How often do you attend church or religious services?: More than 4 times per year    Do you belong to any clubs or organizations such as church groups, unions, fraternal or athletic groups, or school groups?: No    How often do you attend meetings of the clubs or organizations you belong to?: Never    Are you married, widowed, divorced, separated, never married, or living with a partner?: Divorced  Intimate Partner Violence: Not At Risk (01/03/2023)   Humiliation, Afraid, Rape,  and Kick questionnaire    Fear of Current or Ex-Partner: No    Emotionally Abused: No    Physically Abused: No    Sexually Abused: No   ROS: All systems reviewed and negative except as per HPI.   Current Outpatient Medications  Medication Sig Dispense Refill   ACCU-CHEK GUIDE TEST test strip 1 each by Other route as needed for other.     amiodarone  (PACERONE ) 100 MG tablet Take 1 tablet (100 mg total) by mouth daily. 90 tablet 1   aspirin  EC 81 MG tablet Take 1 tablet (81 mg total) by mouth daily. Swallow whole. 30 tablet 12   b complex vitamins capsule Take 1 capsule by mouth daily.     busPIRone (BUSPAR) 5 MG tablet Take 5 mg by mouth 2 (two) times daily.     cholecalciferol  (VITAMIN D3) 25 MCG (1000 UNIT) tablet Take 1,000 Units by mouth daily.     estradiol (ESTRACE) 0.1 MG/GM vaginal cream Place 1 Applicatorful vaginally daily.     gabapentin (NEURONTIN) 100 MG capsule Take 100 mg by mouth at bedtime.     nortriptyline  (PAMELOR ) 25 MG capsule Take 1 capsule (25 mg total) by mouth at bedtime. 30 capsule 11   Omega-3 Fatty Acids (FISH OIL) 1000 MG CAPS Take 100 mg by mouth daily.     spironolactone  (ALDACTONE ) 25 MG tablet Take 1 tablet (25 mg total) by mouth daily. 30 tablet 5   STUDY - LIBREXIA-AF -  apixaban  5 mg or placebo capsule (PI-Sethi) Take 1 capsule (5 mg total) by mouth 2 (two) times daily. Take at approximately the same time of day with or without food. Please bring bottle back with you to every visit; do not discard bottle. For Investigational Use only. 280 capsule 0   STUDY - LIBREXIA-AF - [id number] (Milvexian) 100 mg or placebo tablet (PI-Sethi) Take 1 tablet by mouth 2 (two) times daily. Take at approximately the same time of day with or without food. Please bring bottle back with you to every visit; do not discard bottle. For Investigational Use only. 280 tablet 0   torsemide  (DEMADEX ) 20 MG tablet Take 40 MG in the morning and 20 MG in the afternoon 45 tablet 5   ursodiol (ACTIGALL) 300 MG capsule Take 300 mg by mouth 3 (three) times daily.     Galcanezumab -gnlm (EMGALITY ) 120 MG/ML SOAJ Inject 120 mg into the skin every 30 (thirty) days. (Patient not taking: Reported on 05/05/2024) 1.12 mL 11   No current facility-administered medications for this visit.   Facility-Administered Medications Ordered in Other Visits  Medication Dose Route Frequency Provider Last Rate Last Admin   0.9 %  sodium chloride  infusion   Intravenous Continuous Carim, Charles A, PA-C 10 mL/hr at 05/06/24 0925 New Bag at 05/06/24 0925   ondansetron  (ZOFRAN ) 4 MG/2ML injection            BP 103/70 (BP Location: Right Arm, Patient Position: Sitting, Cuff Size: Normal)   Pulse 72   Wt 155 lb (70.3 kg)   SpO2 100%   BMI 26.61 kg/m  General: NAD Neck: JVP 8-9 cm with HJR, no thyromegaly or thyroid  nodule.  Lungs: Clear to auscultation bilaterally with normal respiratory effort. CV: Nondisplaced PMI.  Heart regular S1/S2, no S3/S4, no murmur.  No peripheral edema.  No carotid bruit.  Normal pedal pulses.  Abdomen: Soft, nontender, no hepatosplenomegaly, no distention.  Skin: Intact without lesions or rashes.  Neurologic: Alert and oriented x 3.  Psych: Normal affect. Extremities: No clubbing or  cyanosis.  HEENT: Normal.   Assessment/Plan:  1. Hypertrophic cardiomyopathy: Mother with HCM, gene+ but we do not know which (testing not in [facility] system). She had a cardiac MRI in 5/21 that showed LV EF 67%, moderate asymmetric hypertrophy of the mid septum, large area of mid-wall LGE in the mid septum (>15% of myocardium), ECV 22%, RV EF 70%. Last echo in 7/25 showed EF 45-50%, mild LVH, normal RV, PASP 68 mmHg, severe LAE, moderate MR, IVC dilated.  No LVOT gradient or SAM reported.  Of note, cath in 2/24 showed minimal CAD. She does not have a prominent murmur on exam. She has been thought to have nonobstructive HCM variant.  She has a Medtronic ICD.  On exam, she is volume overloaded with NYHA class II-III symptoms.  - I will increase torsemide  to 40 qam/20 qpm with BMET/BNP in 1 week.  - Increase spironolactone  to 25 mg daily.  - Eventually add SGLT2 inhibitor.  - I will arrange for RHC to assess filling pressures and PA pressure.  We discussed risks/benefits and she agrees to procedure.  - I will arrange for repeat echo to be done at [facility] with maneuvers to bring out LVOT gradient if present.  - We do not know what gene abnormality her mother had, patient will try to find out.  In the mean time, I will send [facility] testing on her to see if this can pick up her HCM mutation.   - LV EF 45-50% on last echo, fall in EF could be due to RV pacing since dual chamber ICD placed in 3/24.  She was RV paced 98% of the time on device interrogation. May need to address this with EP in the future.  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 She is pacer dependent and RV-pacing 98% of the time.  3. Atrial fibrillation: Paroxysmal.  S/p AF ablation x 2 and Maze with LA appendage clipping.  She is currently a-pacing.  - Continue amiodarone  100 mg daily.  AST/ALT have been normal recently, but AP has been elevated and she is going to undergo liver biopsy.  -  Continue apixaban  vs study drug.  4. S/p papillary fibroelastoma excision from aortic valve.  5. VT/NSVT: On amiodarone  and has MDT ICD.   Followup after RHC with me.   I spent 56 minutes reviewing records, interviewing/examining patient, and managing orders.   Ezra Shuck 05/06/2024

## 2024-05-06 NOTE — Progress Notes (Signed)
 PCP: Cyrus Selinda Moose, PA-C Cardiology: Dr. Perla HF Cardiology: Dr. Rolan  Chief complaint: CHF  53 y.o. with history of hypertrophic cardiomyopathy, VT, bradycardia with dual chamber Medtronic ICD, paroxysmal atrial fibrillation, papillary fibroelastoma s/p resection was referred by Dr. Gollan for CHF evaluation.  Patient carries history of HCM, in the past imaging has shown nonobstructive HCM.  She tells me that her mother has HCM and had positive gene testing though she does not know which gene (it was not done through the [facility] system).  She had a cardiac MRI in 5/21 that showed LV EF 67%, moderate asymmetric hypertrophy of the mid septum, large area of mid-wall LGE in the mid septum, ECV 22%, RV EF 70%. Last echo in 7/25 showed EF 45-50%, mild LVH, normal RV, PASP 68 mmHg, severe LAE, moderate MR, IVC dilated.  No LVOT gradient or SAM reported.  Cath in 2/24 showed no significant CAD.  She had AF ablations in 4/21 and 11/22.  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  She had surgery in 2023 with fibroelastoma excision as well as Maze and LA appendage clip.  She was noted to have junctional bradycardia and long pauses in 3/24, Medtronic dual chamber PPM was placed. Recently, alkaline phosphatase has been elevated, and she is planned for a liver biopsy this month.   She reports dyspnea and fatigue with inclines and stairs.  She does ok walking on flat ground and can even get around [place].  No chest pain or lightheadedness.  No syncope.  Diuretic dose has been increased recently. She feels like her exercise tolerance has worsened recently.   ECG (04/30/24, personally reviewed): A-V sequential pacing  Labs (6/25): LDL 49 Labs (8/25): K 4, creatinine 1.44, AST/ALT normal, alkaline phosphatase 179  Medtronic device interrogation: 98% v-pacing, 100% a-pacing, thoracic impedance starting to trend down.   PMH: 1. VT/NSVT: Medtronic dual chamber  ICD.  2. Atrial fibrillation: S/p ablations in 4/21 and 11/22, s/p Maze and LA appendage clip in 2023.  3. Papillary fibroelastoma: Aortic valve, s/p surgical excision in 2023.  The fibroelastoma was associated with CVA.  4. Bradycardia: With junctional rhythm and pauses up to 8.2 seconds.   - Medtronic dual chamber ICD placed in 3/24.  5. Obesity: Gastric bypass in 2016.  6. HTN 7. 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  8. Hypertrophic cardiomyopathy: Mother has gene+ HCM.   - Cardiac MRI (5/21): LV EF 67%, moderate asymmetric hypertrophy of the mid septum, large area of mid-wall LGE in the mid septum, ECV 22%, RV EF 70%.  - Echo (7/25): EF 45-50%, mild LVH, normal RV, PASP 68 mmHg, severe LAE, moderate MR, IVC dilated.  No LVOT gradient or SAM reported.  9. Elevated LFTs.  10. CKD stage 3  FH: Mother with HCM, gene+.  Daughter with POTS.   Social History   Socioeconomic History   Marital status: Significant Other    Spouse name: Not on file   Number of children: 3   Years of education: two years of college   Highest education level: Not on file  Occupational History   Occupation: office assistant  Tobacco Use   Smoking status: Former    Current packs/day: 0.00    Average packs/day: 0.3 packs/day for 31.0 years (7.8 ttl pk-yrs)    Types: Cigarettes    Start date: 10/04/1991    Quit date: 10/03/2022    Years since quitting: 1.5    Passive exposure:  Current   Smokeless tobacco: Never  Vaping Use   Vaping status: Every Day  Substance and Sexual Activity   Alcohol use: Yes    Alcohol/week: 1.0 - 2.0 standard drink of alcohol    Types: 1 - 2 Standard drinks or equivalent per week    Comment: Occaional use - 1-2 mixed drinks prior to stroke since d/c nothing 11/30/21   Drug use: No   Sexual activity: Yes  Other Topics Concern   Not on file  Social History Narrative   Lives at home with significant other.   Some caffeine daily I her green tea.   Right-handed.   Social  Drivers of Corporate investment banker Strain: Low Risk  (04/01/2024)   Received from [facility]   Overall Financial Resource Strain (CARDIA)    Difficulty of Paying Living Expenses: Not very hard  Food Insecurity: No Food Insecurity (04/01/2024)   Received from [facility]   Hunger Vital Sign    Within the past 12 months, you worried that your food would run out before you got the money to buy more.: Never true    Within the past 12 months, the food you bought just didn't last and you didn't have money to get more.: Never true  Recent Concern: Food Insecurity - Food Insecurity Present (03/17/2024)   Received from [facility]   Hunger Vital Sign    Within the past 12 months, you worried that your food would run out before you got the money to buy more.: Sometimes true    Within the past 12 months, the food you bought just didn't last and you didn't have money to get more.: Never true  Transportation Needs: No Transportation Needs (04/01/2024)   Received from [facility] Transportation    In the past 12 months, has lack of transportation kept you from medical appointments or from getting medications?: No    Lack of Transportation (Non-Medical): No  Physical Activity: Insufficiently Active (03/05/2023)   Received from [facility]   Exercise Vital Sign    On average, how many days per week do you engage in moderate to strenuous exercise (like a brisk walk)?: 7 days    On average, how many minutes do you engage in exercise at this level?: 20 min  Stress: Stress Concern Present (03/05/2023)   Received from [facility] - Occupational Stress Questionnaire    Feeling of Stress : To some extent  Social Connections: Moderately Isolated (03/05/2023)   Received from [facility]   Social Connection and Isolation Panel    In  a typical week, how many times do you talk on the phone with family, friends, or neighbors?: More than three times a week    How often do you get together with friends or relatives?: More than three times a week    How often do you attend church or religious services?: More than 4 times per year    Do you belong to any clubs or organizations such as church groups, unions, fraternal or athletic groups, or school groups?: No    How often do you attend meetings of the clubs or organizations you belong to?: Never    Are you married, widowed, divorced, separated, never married, or living with a partner?: Divorced  Intimate Partner Violence: Not At Risk (01/03/2023)   Humiliation, Afraid, Rape,  and Kick questionnaire    Fear of Current or Ex-Partner: No    Emotionally Abused: No    Physically Abused: No    Sexually Abused: No   ROS: All systems reviewed and negative except as per HPI.   Current Outpatient Medications  Medication Sig Dispense Refill   ACCU-CHEK GUIDE TEST test strip 1 each by Other route as needed for other.     amiodarone  (PACERONE ) 100 MG tablet Take 1 tablet (100 mg total) by mouth daily. 90 tablet 1   aspirin  EC 81 MG tablet Take 1 tablet (81 mg total) by mouth daily. Swallow whole. 30 tablet 12   b complex vitamins capsule Take 1 capsule by mouth daily.     busPIRone (BUSPAR) 5 MG tablet Take 5 mg by mouth 2 (two) times daily.     cholecalciferol  (VITAMIN D3) 25 MCG (1000 UNIT) tablet Take 1,000 Units by mouth daily.     estradiol (ESTRACE) 0.1 MG/GM vaginal cream Place 1 Applicatorful vaginally daily.     gabapentin (NEURONTIN) 100 MG capsule Take 100 mg by mouth at bedtime.     nortriptyline  (PAMELOR ) 25 MG capsule Take 1 capsule (25 mg total) by mouth at bedtime. 30 capsule 11   Omega-3 Fatty Acids (FISH OIL) 1000 MG CAPS Take 100 mg by mouth daily.     spironolactone  (ALDACTONE ) 25 MG tablet Take 1 tablet (25 mg total) by mouth daily. 30 tablet 5   STUDY - LIBREXIA-AF -  apixaban  5 mg or placebo capsule (PI-Sethi) Take 1 capsule (5 mg total) by mouth 2 (two) times daily. Take at approximately the same time of day with or without food. Please bring bottle back with you to every visit; do not discard bottle. For Investigational Use only. 280 capsule 0   STUDY - LIBREXIA-AF - [id number] (Milvexian) 100 mg or placebo tablet (PI-Sethi) Take 1 tablet by mouth 2 (two) times daily. Take at approximately the same time of day with or without food. Please bring bottle back with you to every visit; do not discard bottle. For Investigational Use only. 280 tablet 0   torsemide  (DEMADEX ) 20 MG tablet Take 40 MG in the morning and 20 MG in the afternoon 45 tablet 5   ursodiol (ACTIGALL) 300 MG capsule Take 300 mg by mouth 3 (three) times daily.     Galcanezumab -gnlm (EMGALITY ) 120 MG/ML SOAJ Inject 120 mg into the skin every 30 (thirty) days. (Patient not taking: Reported on 05/05/2024) 1.12 mL 11   No current facility-administered medications for this visit.   Facility-Administered Medications Ordered in Other Visits  Medication Dose Route Frequency Provider Last Rate Last Admin   0.9 %  sodium chloride  infusion   Intravenous Continuous Carim, Charles A, PA-C 10 mL/hr at 05/06/24 0925 New Bag at 05/06/24 0925   ondansetron  (ZOFRAN ) 4 MG/2ML injection            BP 103/70 (BP Location: Right Arm, Patient Position: Sitting, Cuff Size: Normal)   Pulse 72   Wt 155 lb (70.3 kg)   SpO2 100%   BMI 26.61 kg/m  General: NAD Neck: JVP 8-9 cm with HJR, no thyromegaly or thyroid  nodule.  Lungs: Clear to auscultation bilaterally with normal respiratory effort. CV: Nondisplaced PMI.  Heart regular S1/S2, no S3/S4, no murmur.  No peripheral edema.  No carotid bruit.  Normal pedal pulses.  Abdomen: Soft, nontender, no hepatosplenomegaly, no distention.  Skin: Intact without lesions or rashes.  Neurologic: Alert and oriented x 3.  Psych: Normal affect. Extremities: No clubbing or  cyanosis.  HEENT: Normal.   Assessment/Plan:  1. Hypertrophic cardiomyopathy: Mother with HCM, gene+ but we do not know which (testing not in [facility] system). She had a cardiac MRI in 5/21 that showed LV EF 67%, moderate asymmetric hypertrophy of the mid septum, large area of mid-wall LGE in the mid septum (>15% of myocardium), ECV 22%, RV EF 70%. Last echo in 7/25 showed EF 45-50%, mild LVH, normal RV, PASP 68 mmHg, severe LAE, moderate MR, IVC dilated.  No LVOT gradient or SAM reported.  Of note, cath in 2/24 showed minimal CAD. She does not have a prominent murmur on exam. She has been thought to have nonobstructive HCM variant.  She has a Medtronic ICD.  On exam, she is volume overloaded with NYHA class II-III symptoms.  - I will increase torsemide  to 40 qam/20 qpm with BMET/BNP in 1 week.  - Increase spironolactone  to 25 mg daily.  - Eventually add SGLT2 inhibitor.  - I will arrange for RHC to assess filling pressures and PA pressure.  We discussed risks/benefits and she agrees to procedure.  - I will arrange for repeat echo to be done at [facility] with maneuvers to bring out LVOT gradient if present.  - We do not know what gene abnormality her mother had, patient will try to find out.  In the mean time, I will send [facility] testing on her to see if this can pick up her HCM mutation.   - LV EF 45-50% on last echo, fall in EF could be due to RV pacing since dual chamber ICD placed in 3/24.  She was RV paced 98% of the time on device interrogation. May need to address this with EP in the future.  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 She is pacer dependent and RV-pacing 98% of the time.  3. Atrial fibrillation: Paroxysmal.  S/p AF ablation x 2 and Maze with LA appendage clipping.  She is currently a-pacing.  - Continue amiodarone  100 mg daily.  AST/ALT have been normal recently, but AP has been elevated and she is going to undergo liver biopsy.  -  Continue apixaban  vs study drug.  4. S/p papillary fibroelastoma excision from aortic valve.  5. VT/NSVT: On amiodarone  and has MDT ICD.   Followup after RHC with me.   I spent 56 minutes reviewing records, interviewing/examining patient, and managing orders.   Hannah Zimmerman 05/06/2024

## 2024-05-06 NOTE — Procedures (Signed)
 Interventional Radiology Procedure Note  Procedure: liver biopsy Guided Biopsy of left lobe  Complications: None  Estimated Blood Loss: < 10 mL  Findings: 18 G core biopsy of liver performed under U/S guidance.  1 core samples obtained and sent to Pathology.  Hannah Zimmerman Banner, MD

## 2024-05-07 LAB — SURGICAL PATHOLOGY

## 2024-05-08 ENCOUNTER — Encounter: Payer: Self-pay | Admitting: Cardiology

## 2024-05-15 LAB — BASIC METABOLIC PANEL WITH GFR
BUN/Creatinine Ratio: 20 (ref 9–23)
BUN: 33 mg/dL — ABNORMAL HIGH (ref 6–24)
CO2: 25 mmol/L (ref 20–29)
Calcium: 9.7 mg/dL (ref 8.7–10.2)
Chloride: 98 mmol/L (ref 96–106)
Creatinine, Ser: 1.63 mg/dL — ABNORMAL HIGH (ref 0.57–1.00)
Glucose: 69 mg/dL — ABNORMAL LOW (ref 70–99)
Potassium: 4.3 mmol/L (ref 3.5–5.2)
Sodium: 141 mmol/L (ref 134–144)
eGFR: 38 mL/min/1.73 — ABNORMAL LOW (ref >59)

## 2024-05-20 ENCOUNTER — Telehealth (HOSPITAL_COMMUNITY): Payer: Self-pay

## 2024-05-20 ENCOUNTER — Ambulatory Visit (INDEPENDENT_AMBULATORY_CARE_PROVIDER_SITE_OTHER): Payer: Self-pay | Admitting: Neurology

## 2024-05-20 ENCOUNTER — Other Ambulatory Visit: Payer: Self-pay

## 2024-05-20 DIAGNOSIS — R29818 Other symptoms and signs involving the nervous system: Secondary | ICD-10-CM

## 2024-05-20 DIAGNOSIS — I422 Other hypertrophic cardiomyopathy: Secondary | ICD-10-CM

## 2024-05-20 DIAGNOSIS — G43E11 Chronic migraine with aura, intractable, with status migrainosus: Secondary | ICD-10-CM

## 2024-05-20 DIAGNOSIS — Z8673 Personal history of transient ischemic attack (TIA), and cerebral infarction without residual deficits: Secondary | ICD-10-CM

## 2024-05-20 DIAGNOSIS — G4733 Obstructive sleep apnea (adult) (pediatric): Secondary | ICD-10-CM

## 2024-05-20 DIAGNOSIS — I4819 Other persistent atrial fibrillation: Secondary | ICD-10-CM

## 2024-05-20 NOTE — Progress Notes (Signed)
Orders placed for right heart cath

## 2024-05-20 NOTE — Telephone Encounter (Signed)
-----   Message from Nurse [place] R sent at 05/09/2024 10:27 AM EDT ----- Regarding: rhc precert 9/3  Test: right heart cath  Insurance: aetna  CPT: [id number]   Location: [place]  Dx: hypertrophic cardiomyopathy  Provider: dm  Scheduled Date: 05/21/24

## 2024-05-21 ENCOUNTER — Other Ambulatory Visit: Payer: Self-pay

## 2024-05-21 ENCOUNTER — Telehealth: Payer: Self-pay

## 2024-05-21 ENCOUNTER — Other Ambulatory Visit (HOSPITAL_COMMUNITY): Payer: Self-pay

## 2024-05-21 ENCOUNTER — Ambulatory Visit (HOSPITAL_COMMUNITY)
Admission: RE | Admit: 2024-05-21 | Discharge: 2024-05-21 | Disposition: A | Discharge status: Home / Self Care | Attending: Cardiology | Admitting: Cardiology

## 2024-05-21 ENCOUNTER — Encounter (HOSPITAL_COMMUNITY)
Admission: RE | Disposition: A | Discharge status: Home / Self Care | Payer: Self-pay | Source: Home / Self Care | Attending: Cardiology

## 2024-05-21 ENCOUNTER — Encounter (HOSPITAL_COMMUNITY): Payer: Self-pay | Admitting: Cardiology

## 2024-05-21 DIAGNOSIS — I422 Other hypertrophic cardiomyopathy: Secondary | ICD-10-CM | POA: Insufficient documentation

## 2024-05-21 DIAGNOSIS — I421 Obstructive hypertrophic cardiomyopathy: Secondary | ICD-10-CM

## 2024-05-21 DIAGNOSIS — Z8673 Personal history of transient ischemic attack (TIA), and cerebral infarction without residual deficits: Secondary | ICD-10-CM | POA: Diagnosis not present

## 2024-05-21 DIAGNOSIS — F17290 Nicotine dependence, other tobacco product, uncomplicated: Secondary | ICD-10-CM | POA: Insufficient documentation

## 2024-05-21 DIAGNOSIS — I130 Hypertensive heart and chronic kidney disease with heart failure and stage 1 through stage 4 chronic kidney disease, or unspecified chronic kidney disease: Secondary | ICD-10-CM | POA: Diagnosis not present

## 2024-05-21 DIAGNOSIS — Z79899 Other long term (current) drug therapy: Secondary | ICD-10-CM | POA: Diagnosis not present

## 2024-05-21 DIAGNOSIS — Z7901 Long term (current) use of anticoagulants: Secondary | ICD-10-CM | POA: Diagnosis not present

## 2024-05-21 DIAGNOSIS — I272 Pulmonary hypertension, unspecified: Secondary | ICD-10-CM | POA: Diagnosis not present

## 2024-05-21 DIAGNOSIS — I509 Heart failure, unspecified: Secondary | ICD-10-CM | POA: Diagnosis not present

## 2024-05-21 DIAGNOSIS — I472 Ventricular tachycardia, unspecified: Secondary | ICD-10-CM | POA: Insufficient documentation

## 2024-05-21 DIAGNOSIS — N183 Chronic kidney disease, stage 3 unspecified: Secondary | ICD-10-CM | POA: Diagnosis not present

## 2024-05-21 DIAGNOSIS — I480 Paroxysmal atrial fibrillation: Secondary | ICD-10-CM | POA: Diagnosis not present

## 2024-05-21 DIAGNOSIS — Z95810 Presence of automatic (implantable) cardiac defibrillator: Secondary | ICD-10-CM | POA: Insufficient documentation

## 2024-05-21 LAB — POCT I-STAT 7, (LYTES, BLD GAS, ICA,H+H)
Acid-Base Excess: 4 mmol/L — ABNORMAL HIGH (ref 0.0–2.0)
Acid-Base Excess: 3 mmol/L — ABNORMAL HIGH (ref 0.0–2.0)
Bicarbonate: 29.4 mmol/L — ABNORMAL HIGH (ref 20.0–28.0)
Bicarbonate: 27.9 mmol/L (ref 20.0–28.0)
Calcium, Ion: 1.16 mmol/L (ref 1.15–1.40)
Calcium, Ion: 1.11 mmol/L — ABNORMAL LOW (ref 1.15–1.40)
HCT: 35 % — ABNORMAL LOW (ref 36.0–46.0)
HCT: 35 % — ABNORMAL LOW (ref 36.0–46.0)
Hemoglobin: 11.9 g/dL — ABNORMAL LOW (ref 12.0–15.0)
Hemoglobin: 11.9 g/dL — ABNORMAL LOW (ref 12.0–15.0)
O2 Saturation: 68 %
O2 Saturation: 68 %
Potassium: 3.5 mmol/L (ref 3.5–5.1)
Potassium: 3.4 mmol/L — ABNORMAL LOW (ref 3.5–5.1)
Sodium: 139 mmol/L (ref 135–145)
Sodium: 141 mmol/L (ref 135–145)
TCO2: 31 mmol/L (ref 22–32)
TCO2: 29 mmol/L (ref 22–32)
pCO2 arterial: 44.5 mmHg (ref 32–48)
pCO2 arterial: 42.9 mmHg (ref 32–48)
pH, Arterial: 7.427 (ref 7.35–7.45)
pH, Arterial: 7.422 (ref 7.35–7.45)
pO2, Arterial: 35 mmHg — CL (ref 83–108)
pO2, Arterial: 35 mmHg — CL (ref 83–108)

## 2024-05-21 SURGERY — RIGHT HEART CATH
Anesthesia: LOCAL

## 2024-05-21 MED ORDER — ONDANSETRON HCL 4 MG/2ML IJ SOLN: 4.0000 mg | Freq: Four times a day (QID) | INTRAMUSCULAR | Status: DC | PRN

## 2024-05-21 MED ORDER — HYDRALAZINE HCL 20 MG/ML IJ SOLN
10.0000 mg | INTRAMUSCULAR | Status: DC | PRN
Start: 2024-05-21 — End: 2024-05-21

## 2024-05-21 MED ORDER — HEPARIN (PORCINE) IN NACL 1000-0.9 UT/500ML-% IV SOLN
INTRAVENOUS | Status: DC | PRN
  Administered 2024-05-21: 500 mL

## 2024-05-21 MED ORDER — SODIUM CHLORIDE 0.9% FLUSH
3.0000 mL | INTRAVENOUS | Status: DC | PRN
Start: 2024-05-21 — End: 2024-05-21

## 2024-05-21 MED ORDER — FREE WATER: 250.0000 mL | Freq: Once | Status: DC

## 2024-05-21 MED ORDER — LABETALOL HCL 5 MG/ML IV SOLN: 10.0000 mg | INTRAVENOUS | Status: DC | PRN

## 2024-05-21 MED ORDER — SODIUM CHLORIDE 0.9% FLUSH: 3.0000 mL | Freq: Two times a day (BID) | INTRAVENOUS | Status: DC

## 2024-05-21 MED ORDER — EMPAGLIFLOZIN 10 MG PO TABS: 10.0000 mg | ORAL_TABLET | Freq: Every day | ORAL | 3 refills | Status: DC

## 2024-05-21 MED ORDER — SODIUM CHLORIDE 0.9 % IV SOLN: 250.0000 mL | INTRAVENOUS | Status: DC | PRN

## 2024-05-21 MED ORDER — LIDOCAINE HCL (PF) 1 % IJ SOLN
INTRAMUSCULAR | Status: AC
  Filled 2024-05-21: qty 30

## 2024-05-21 MED ORDER — SODIUM CHLORIDE 0.9% FLUSH: 3.0000 mL | INTRAVENOUS | Status: DC | PRN

## 2024-05-21 MED ORDER — LIDOCAINE HCL (PF) 1 % IJ SOLN
INTRAMUSCULAR | Status: DC | PRN
  Administered 2024-05-21: 2 mL via INTRADERMAL

## 2024-05-21 MED ORDER — ASPIRIN 81 MG PO CHEW: 81.0000 mg | CHEWABLE_TABLET | ORAL | Status: DC

## 2024-05-21 MED ORDER — DAPAGLIFLOZIN PROPANEDIOL 10 MG PO TABS: 10.0000 mg | ORAL_TABLET | Freq: Every day | ORAL | 6 refills | Status: DC

## 2024-05-21 MED ORDER — EMPAGLIFLOZIN 10 MG PO TABS
10.0000 mg | ORAL_TABLET | Freq: Every day | ORAL | Status: DC
  Filled 2024-05-21: qty 1

## 2024-05-21 MED ORDER — ACETAMINOPHEN 325 MG PO TABS: 650.0000 mg | ORAL_TABLET | ORAL | Status: DC | PRN

## 2024-05-21 SURGICAL SUPPLY — 5 items
CATH BALLN WEDGE 5F 110CM (CATHETERS) IMPLANT
PACK CARDIAC CATHETERIZATION (CUSTOM PROCEDURE TRAY) ×1 IMPLANT
SHEATH GLIDE SLENDER 4/5FR (SHEATH) IMPLANT
TRANSDUCER W/STOPCOCK (MISCELLANEOUS) IMPLANT
TUBING ART PRESS 72 MALE/FEM (TUBING) IMPLANT

## 2024-05-21 NOTE — Interval H&P Note (Signed)
 History and Physical Interval Note:  05/21/2024 10:35 AM  Hannah Zimmerman  has presented today for surgery, with the diagnosis of cardiomyopathy.  The various methods of treatment have been discussed with the patient and family. After consideration of risks, benefits and other options for treatment, the patient has consented to  Procedure(s): RIGHT HEART CATH (N/A) as a surgical intervention.  The patient's history has been reviewed, patient examined, no change in status, stable for surgery.  I have reviewed the patient's chart and labs.  Questions were answered to the patient's satisfaction.     Quentez Lober [employer]

## 2024-05-21 NOTE — Telephone Encounter (Signed)
 Spoke to pt to make aware of her order for echo. Pt states that her aetna coverage had lapsed and that she would give us  a call when she has new insurance coverage so that her echo can be precerted.

## 2024-05-21 NOTE — Telephone Encounter (Signed)
 Per Dr. Rolan pt to have echo and start jardiance .

## 2024-05-21 NOTE — Progress Notes (Signed)
 [facility]  RANDYE TREICHLER 53 year old female 09-19-1970   HOME SLEEP TEST REPORT ( by Watch PAT)   STUDY DATE:   05-20-2024   Return of data 05-21-2024   ORDERING CLINICIAN:  Harlene Bogaert, NP for Dr Rosemarie.  REFERRING CLINICIAN:    CLINICAL INFORMATION/HISTORY:  Dr Rosemarie is primary Neurologist : patient with a history of stroke and AICD implant, MCA infarction, Atrial fibrillation, pulmonary Hypertension.  Update 04/24/2024 JM: Patient returns for follow-up visit accompanied by her daughter after prior visit 5 months ago noting frequent headaches and was started on Ajovy  in addition to amitriptyline  for prevention and Nurtec for rescue, also noted worsening imbalance and blurred vision over the past 6 weeks and recommended proceeding with CT scan which showed no acute abnormality and referred to PT and ophthalmology.   Currently, she reports having a migraine every day usually occurring in the late afternoon. Never in same spot, can be occpital, top of head, right or left temporal or frontal. Starts with dull ache and then feels like ice pick. Previously taking Tylenol  almost daily which she felt kept headaches at bay but was required to discontinue recently due to elevated LFTs.  Did try Nurtec but denies benefit.  Denies any benefit with Ajovy , last injection 2 weeks ago.  She remains on amitriptyline  but questions trying nortriptyline . She will occasionally wake with a headache. She does experience excessive daytime fatigue.  Denies snoring, witnessed apneas or insomnia. Mother has sleep apnea. She has never previously been tested.  She does have chronic neck pain but this has been well-controlled with gabapentin.   No new stroke/TIA symptoms.      Epworth sleepiness score: NA /24.   BMI: 26 kg/m   Neck Circumference: NA   FINDINGS:   Sleep Summary:   Total Recording Time (hours, min): 7 hours 44-minute      Total Sleep Time (hours, min):    7 hours  4             Percent REM (%):    20 point                                    Respiratory Indices:   Calculated pAHI (per AASM or CMS guideline): Following AASM guidelines, this patient's total apnea hypopnea index is 2.0  /h             REM pAHI:    0.0                                             NREM pAHI:    2.2                           Snoring:  none                                               Oxygen Saturation Statistics:   Oxygen Saturation (%) Mean:   95 %            O2 Saturation Range (%):  between 85-99%                                O2 Saturation (minutes) <89%:    < 1 minute       Pulse Rate Statistics:   Pulse Mean (bpm):  69 bpm,  between 58- 101 bpm,                 IMPRESSION:  This HST  did not find any Sleep apnea, hypoxia or tachy-brady cardia.    RECOMMENDATION: no sleep intervention needed.    Any patient should be cautioned not to drive, work at heights, or operate dangerous or heavy equipment when tired or sleepy.   Review of good sleep hygiene measures is accessible to any sleep clinic patient and can be reiterated through online material- I we recommend the Guide to better Sleep   by the NIH.   The referring physician will be notified of the test results.       INTERPRETING PHYSICIAN:  Dedra Gores, MD  [facility] and [facility] Sleep Board certified by The [employer] of Sleep Medicine and Diplomate of the [employer] of Sleep Medicine. Board certified In Neurology through the ABPN, Fellow of the [employer] of Neurology.

## 2024-05-21 NOTE — Discharge Instructions (Addendum)
 Brachial Site Care   This sheet gives you information about how to care for yourself after your procedure. Your health care provider may also give you more specific instructions. If you have problems or questions, contact your health care provider. What can I expect after the procedure? After the procedure, it is common to have: Bruising and tenderness at the catheter insertion area. Follow these instructions at home:  Insertion site care Follow instructions from your health care provider about how to take care of your insertion site. Make sure you: Wash your hands with soap and water  before you change your bandage (dressing). If soap and water  are not available, use hand sanitizer. Remove your dressing as told by your health care provider. In 24 hours Check your insertion site every day for signs of infection. Check for: Redness, swelling, or pain. Pus or a bad smell. Warmth. You may shower 24-48 hours after the procedure. Do not apply powder or lotion to the site.  Activity For 24 hours after the procedure, or as directed by your health care provider: Do not push or pull heavy objects with the affected arm. Do not drive yourself home from the hospital or clinic. You may drive 24 hours after the procedure unless your health care provider tells you not to. Do not lift anything that is heavier than 10 lb (4.5 kg), or the limit that you are told, until your health care provider says that it is safe.  For 24 hours      Start Farxiga  10 mg daily

## 2024-05-23 ENCOUNTER — Encounter: Payer: Self-pay | Admitting: Cardiology

## 2024-05-27 DIAGNOSIS — Z8673 Personal history of transient ischemic attack (TIA), and cerebral infarction without residual deficits: Secondary | ICD-10-CM | POA: Insufficient documentation

## 2024-05-27 NOTE — Procedures (Signed)
 [facility]  Hannah Zimmerman 53 year old female 05/25/1971   HOME SLEEP TEST REPORT ( by Watch PAT)   STUDY DATE:   05-20-2024   Return of data 05-21-2024   ORDERING CLINICIAN:  Harlene Bogaert, NP for Dr Rosemarie.  REFERRING CLINICIAN:    CLINICAL INFORMATION/HISTORY:  Dr Rosemarie is primary Neurologist : patient with a history of stroke and AICD implant, MCA infarction, Atrial fibrillation, pulmonary Hypertension.  Update 04/24/2024 JM: Patient returns for follow-up visit accompanied by her daughter after prior visit 5 months ago noting frequent headaches and was started on Ajovy  in addition to amitriptyline  for prevention and Nurtec for rescue, also noted worsening imbalance and blurred vision over the past 6 weeks and recommended proceeding with CT scan which showed no acute abnormality and referred to PT and ophthalmology.   Currently, she reports having a migraine every day usually occurring in the late afternoon. Never in same spot, can be occpital, top of head, right or left temporal or frontal. Starts with dull ache and then feels like ice pick. Previously taking Tylenol  almost daily which she felt kept headaches at bay but was required to discontinue recently due to elevated LFTs.  Did try Nurtec but denies benefit.  Denies any benefit with Ajovy , last injection 2 weeks ago.  She remains on amitriptyline  but questions trying nortriptyline . She will occasionally wake with a headache. She does experience excessive daytime fatigue.  Denies snoring, witnessed apneas or insomnia. Mother has sleep apnea. She has never previously been tested.  She does have chronic neck pain but this has been well-controlled with gabapentin.   No new stroke/TIA symptoms.      Epworth sleepiness score: NA /24.   BMI: 26 kg/m   Neck Circumference: NA   FINDINGS:   Sleep Summary:   Total Recording Time (hours, min): 7 hours 44-minute      Total Sleep Time (hours, min):    7 hours 4              Percent REM (%):    20 point                                    Respiratory Indices:   Calculated pAHI (per AASM or CMS guideline): Following AASM guidelines, this patient's total apnea hypopnea index is 2.0  /h             REM pAHI:    0.0                                             NREM pAHI:    2.2                           Snoring:  none                                               Oxygen Saturation Statistics:   Oxygen Saturation (%) Mean:   95 %            O2 Saturation Range (%):    between  85-99%                                O2 Saturation (minutes) <89%:    < 1 minute       Pulse Rate Statistics:   Pulse Mean (bpm):  69 bpm,  between 58- 101 bpm,                 IMPRESSION:  This HST  did not find any Sleep apnea, hypoxia or tachy-brady cardia.    RECOMMENDATION: no sleep intervention needed.    Any patient should be cautioned not to drive, work at heights, or operate dangerous or heavy equipment when tired or sleepy.   Review of good sleep hygiene measures is accessible to any sleep clinic patient and can be reiterated through online material- I we recommend the Guide to better Sleep   by the NIH.   The referring physician will be notified of the test results.       INTERPRETING PHYSICIAN:  Dedra Gores, MD  [facility] and [facility] Sleep Board certified by The [employer] of Sleep Medicine and Diplomate of the [employer] of Sleep Medicine. Board certified In Neurology through the ABPN, Fellow of the [employer].

## 2024-05-29 ENCOUNTER — Ambulatory Visit: Payer: Self-pay | Admitting: Adult Health

## 2024-06-03 ENCOUNTER — Telehealth: Payer: Self-pay

## 2024-06-03 NOTE — Telephone Encounter (Signed)
 Called pt per dr mclean to have her put on the schedule this week at [facility]. Ok to double book but preferable 06/06/24 at 2:45.

## 2024-06-04 ENCOUNTER — Telehealth: Payer: Self-pay | Admitting: Pharmacist

## 2024-06-04 ENCOUNTER — Other Ambulatory Visit (HOSPITAL_COMMUNITY): Payer: Self-pay

## 2024-06-04 ENCOUNTER — Encounter: Payer: Self-pay | Admitting: Cardiology

## 2024-06-04 NOTE — Telephone Encounter (Signed)
 Patient reports difficulty with amiodarone  cost. Insurance is inactive. Cash price at [facility] [facility] is $9 and [facility] at [employer] is $15.76.

## 2024-06-05 ENCOUNTER — Telehealth: Payer: Self-pay

## 2024-06-05 MED ORDER — AMIODARONE HCL 100 MG PO TABS: 100.0000 mg | ORAL_TABLET | Freq: Every day | ORAL | 11 refills | Status: AC

## 2024-06-05 NOTE — Telephone Encounter (Signed)
 Per dr. Mclean, pt needs to follow up this week. The pt is unable to make appt at this time due to loss of insurance.

## 2024-06-12 ENCOUNTER — Ambulatory Visit: Payer: Self-pay | Admitting: Physician Assistant

## 2024-06-17 ENCOUNTER — Other Ambulatory Visit (HOSPITAL_COMMUNITY): Payer: Self-pay | Admitting: Neurology

## 2024-06-17 MED ORDER — STUDY - LIBREXIA-AF - JNJ-70033093 (MILVEXIAN) 100 MG OR PLACEBO (PI-SETHI): 1.0000 | ORAL_TABLET | Freq: Two times a day (BID) | ORAL | 0 refills | Status: AC

## 2024-06-17 MED ORDER — STUDY - LIBREXIA-AF - APIXABAN 5 MG OR PLACEBO CAPSULE (PI-SETHI): 5.0000 mg | ORAL_CAPSULE | Freq: Two times a day (BID) | ORAL | 0 refills | Status: AC

## 2024-06-19 ENCOUNTER — Ambulatory Visit: Payer: 59

## 2024-06-19 DIAGNOSIS — I422 Other hypertrophic cardiomyopathy: Secondary | ICD-10-CM

## 2024-06-20 LAB — CUP PACEART REMOTE DEVICE CHECK
Battery Remaining Longevity: 97 mo
Battery Voltage: 2.99 V
Brady Statistic AP VP Percent: 87.91 %
Brady Statistic AP VS Percent: 9.94 %
Brady Statistic AS VP Percent: 0.08 %
Brady Statistic AS VS Percent: 2.06 %
Brady Statistic RA Percent Paced: 99.36 %
Brady Statistic RV Percent Paced: 88 %
Date Time Interrogation Session: 20251001195632
HighPow Impedance: 65 Ohm
Implantable Lead Connection Status: 753985
Implantable Lead Connection Status: 753985
Implantable Lead Implant Date: 20240329
Implantable Lead Implant Date: 20240329
Implantable Lead Location: 753859
Implantable Lead Location: 753860
Implantable Lead Model: 5076
Implantable Pulse Generator Implant Date: 20240329
Lead Channel Impedance Value: 323 Ohm
Lead Channel Impedance Value: 437 Ohm
Lead Channel Impedance Value: 456 Ohm
Lead Channel Pacing Threshold Amplitude: 0.875 V
Lead Channel Pacing Threshold Amplitude: 1.375 V
Lead Channel Pacing Threshold Pulse Width: 0.4 ms
Lead Channel Pacing Threshold Pulse Width: 0.4 ms
Lead Channel Sensing Intrinsic Amplitude: 1.4 mV
Lead Channel Sensing Intrinsic Amplitude: 6 mV
Lead Channel Setting Pacing Amplitude: 1.75 V
Lead Channel Setting Pacing Amplitude: 2.25 V
Lead Channel Setting Pacing Pulse Width: 0.4 ms
Lead Channel Setting Sensing Sensitivity: 0.3 mV
Zone Setting Status: 755011
Zone Setting Status: 755011
Zone Setting Status: 755011

## 2024-06-20 NOTE — Progress Notes (Signed)
 Remote ICD Transmission

## 2024-06-23 ENCOUNTER — Ambulatory Visit: Payer: Self-pay | Admitting: Cardiology

## 2024-06-24 ENCOUNTER — Ambulatory Visit (HOSPITAL_COMMUNITY)
Admission: RE | Admit: 2024-06-24 | Discharge: 2024-06-24 | Disposition: A | Discharge status: Home / Self Care | Source: Ambulatory Visit | Attending: Student in an Organized Health Care Education/Training Program | Admitting: Student in an Organized Health Care Education/Training Program

## 2024-06-24 DIAGNOSIS — I421 Obstructive hypertrophic cardiomyopathy: Secondary | ICD-10-CM

## 2024-06-24 DIAGNOSIS — I422 Other hypertrophic cardiomyopathy: Secondary | ICD-10-CM | POA: Insufficient documentation

## 2024-06-25 ENCOUNTER — Ambulatory Visit (HOSPITAL_COMMUNITY): Payer: Self-pay | Admitting: Cardiology

## 2024-06-25 LAB — ECHOCARDIOGRAM COMPLETE
AR max vel: 2.5 cm2
AV Area VTI: 2.52 cm2
AV Area mean vel: 2.29 cm2
AV Mean grad: 3.5 mmHg
AV Peak grad: 6.6 mmHg
Ao pk vel: 1.29 m/s
Area-P 1/2: 2.97 cm2
MV M vel: 3.83 m/s
MV Peak grad: 58.6 mmHg
Radius: 0.9 cm
S' Lateral: 2.9 cm

## 2024-06-27 ENCOUNTER — Telehealth: Payer: Self-pay | Admitting: Cardiology

## 2024-06-27 NOTE — Progress Notes (Signed)
 Remote ICD Transmission

## 2024-06-27 NOTE — Telephone Encounter (Signed)
 Called to confirm/remind patient of [facility].   Appointment:   [x] Confirmed  [] Left mess   [] No answer/No voice mail  [] VM Full/unable to leave message  [] Phone not in service  Patient reminded to bring all medications and/or complete list.  Confirmed patient has transportation. Gave directions, instructed to utilize valet parking.

## 2024-06-30 ENCOUNTER — Ambulatory Visit: Payer: Self-pay | Admitting: Physician Assistant

## 2024-06-30 ENCOUNTER — Other Ambulatory Visit
Admission: RE | Admit: 2024-06-30 | Discharge: 2024-06-30 | Disposition: A | Discharge status: Home / Self Care | Source: Ambulatory Visit | Attending: Cardiology | Admitting: Cardiology

## 2024-06-30 ENCOUNTER — Ambulatory Visit (HOSPITAL_BASED_OUTPATIENT_CLINIC_OR_DEPARTMENT_OTHER): Payer: Self-pay | Admitting: Physician Assistant

## 2024-06-30 ENCOUNTER — Other Ambulatory Visit (HOSPITAL_COMMUNITY): Payer: Self-pay

## 2024-06-30 VITALS — BP 102/85 | HR 83 | Wt 157.0 lb

## 2024-06-30 DIAGNOSIS — I480 Paroxysmal atrial fibrillation: Secondary | ICD-10-CM

## 2024-06-30 DIAGNOSIS — I50810 Right heart failure, unspecified: Secondary | ICD-10-CM | POA: Insufficient documentation

## 2024-06-30 DIAGNOSIS — I361 Nonrheumatic tricuspid (valve) insufficiency: Secondary | ICD-10-CM | POA: Diagnosis not present

## 2024-06-30 DIAGNOSIS — I422 Other hypertrophic cardiomyopathy: Secondary | ICD-10-CM

## 2024-06-30 DIAGNOSIS — I472 Ventricular tachycardia, unspecified: Secondary | ICD-10-CM

## 2024-06-30 LAB — BASIC METABOLIC PANEL WITH GFR
Anion gap: 12 (ref 5–15)
BUN: 24 mg/dL — ABNORMAL HIGH (ref 6–20)
CO2: 28 mmol/L (ref 22–32)
Calcium: 8.9 mg/dL (ref 8.9–10.3)
Chloride: 100 mmol/L (ref 98–111)
Creatinine, Ser: 1.32 mg/dL — ABNORMAL HIGH (ref 0.44–1.00)
GFR, Estimated: 49 mL/min — ABNORMAL LOW (ref >60)
Glucose, Bld: 85 mg/dL (ref 70–99)
Potassium: 3.8 mmol/L (ref 3.5–5.1)
Sodium: 140 mmol/L (ref 135–145)

## 2024-06-30 LAB — CBC
HCT: 35.9 % — ABNORMAL LOW (ref 36.0–46.0)
Hemoglobin: 12 g/dL (ref 12.0–15.0)
MCH: 30.9 pg (ref 26.0–34.0)
MCHC: 33.4 g/dL (ref 30.0–36.0)
MCV: 92.5 fL (ref 80.0–100.0)
Platelets: 244 K/uL (ref 150–400)
RBC: 3.88 MIL/uL (ref 3.87–5.11)
RDW: 18 % — ABNORMAL HIGH (ref 11.5–15.5)
WBC: 9.1 K/uL (ref 4.0–10.5)
nRBC: 0 % (ref 0.0–0.2)

## 2024-06-30 LAB — BRAIN NATRIURETIC PEPTIDE: B Natriuretic Peptide: 806.3 pg/mL — ABNORMAL HIGH (ref 0.0–100.0)

## 2024-06-30 MED ORDER — TORSEMIDE 20 MG PO TABS: 40.0000 mg | ORAL_TABLET | Freq: Two times a day (BID) | ORAL | 5 refills | Status: DC

## 2024-06-30 MED ORDER — POTASSIUM CHLORIDE CRYS ER 20 MEQ PO TBCR: 20.0000 meq | EXTENDED_RELEASE_TABLET | Freq: Every day | ORAL | 1 refills | Status: AC

## 2024-06-30 NOTE — Progress Notes (Signed)
 PCP: Cyrus Selinda Moose, PA-C Cardiology: Dr. Perla HF Cardiology: Dr. Rolan  Chief complaint: CHF  53 y.o. with history of hypertrophic cardiomyopathy, VT, bradycardia with dual chamber Medtronic ICD, paroxysmal atrial fibrillation, papillary fibroelastoma s/p resection was referred by Dr. Gollan for CHF evaluation.  Patient carries history of HCM, in the past imaging has shown nonobstructive HCM.  She tells me that her mother has HCM and had positive gene testing though she does not know which gene (it was not done through the [facility] system).  She had a cardiac MRI in 5/21 that showed LV EF 67%, moderate asymmetric hypertrophy of the mid septum, large area of mid-wall LGE in the mid septum, ECV 22%, RV EF 70%. Last echo in 7/25 showed EF 45-50%, mild LVH, normal RV, PASP 68 mmHg, severe LAE, moderate MR, IVC dilated.  No LVOT gradient or SAM reported.  Cath in 2/24 showed no significant CAD.  She had AF ablations in 4/21 and 11/22.  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  She had surgery in 2023 with fibroelastoma excision as well as Maze and LA appendage clip.  She was noted to have junctional bradycardia and long pauses in 3/24, Medtronic dual chamber PPM was placed. Recently, alkaline phosphatase has been elevated, and she is planned for a liver biopsy.   Seen by Dr. Rolan in 08/25. She was volume overloaded. Diuretics and GDMT titrated. Underwent RHC on 05/21/24 w/ normal right-sided filing pressures, elevated PCWP with prominent v waves, mild pulmonary venous hypertension and preserved CO. Echo 06/24/24 w/ EF 45-50%, 13 mm basal septal hypertrophy, no clear inducible gradient w/ valsava, RV severely enlarged w/ RVSP 59 mmHg, severe BAE, mild to moderate MR, massive TR.  She is here today for CHF follow-up. She is accompanied by her daughter who assists with the history. Stopped taking Jardiance  about 4-5 days ago d/t cost. The last few days she's  noticed more lower extremity edema and is concerned she's retaining fluid. She has been wearing compression stockings, especially after recent car ride to and from the beach. No shortness of breath with ordinary activity or abdominal bloating. Taking all other medications as prescribed.  No tobacco use. Occasional ETOH use.   PMH: 1. VT/NSVT: Medtronic dual chamber ICD.  2. Atrial fibrillation: S/p ablations in 4/21 and 11/22, s/p Maze and LA appendage clip in 2023.  3. Papillary fibroelastoma: Aortic valve, s/p surgical excision in 2023.  The fibroelastoma was associated with CVA.  4. Bradycardia: With junctional rhythm and pauses up to 8.2 seconds.   - Medtronic dual chamber ICD placed in 3/24.  5. Obesity: Gastric bypass in 2016.  6. HTN 7. 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  8. Hypertrophic cardiomyopathy: Mother has gene+ HCM.   - Cardiac MRI (5/21): LV EF 67%, moderate asymmetric hypertrophy of the mid septum, large area of mid-wall LGE in the mid septum, ECV 22%, RV EF 70%.  - Echo (7/25): EF 45-50%, mild LVH, normal RV, PASP 68 mmHg, severe LAE, moderate MR, IVC dilated.  No LVOT gradient or SAM reported.  - RHC on 05/21/24 w/ normal right-sided filing pressures, elevated PCWP with prominent v waves, mild pulmonary venous hypertension and preserved CO Echo 06/24/24 w/ EF 45-50%, 13 mm basal septal hypertrophy, no clear inducible gradient w/ valsava, RV severely enlarged w/ RVSP 59 mmHg, severe BAE, mild to moderate MR, massive TR 9. Elevated LFTs.  10. CKD stage 3  FH: Mother with HCM,  gene+.  Two daughters have POTS and Ehlers-Danlos syndrome.   Social History   Socioeconomic History   Marital status: Significant Other    Spouse name: Not on file   Number of children: 3   Years of education: two years of college   Highest education level: Not on file  Occupational History   Occupation: office assistant  Tobacco Use   Smoking status: Former    Current packs/day: 0.00     Average packs/day: 0.3 packs/day for 31.0 years (7.8 ttl pk-yrs)    Types: Cigarettes    Start date: 10/04/1991    Quit date: 10/03/2022    Years since quitting: 1.7    Passive exposure: Current   Smokeless tobacco: Never  Vaping Use   Vaping status: Every Day  Substance and Sexual Activity   Alcohol use: Yes    Alcohol/week: 1.0 - 2.0 standard drink of alcohol    Types: 1 - 2 Standard drinks or equivalent per week    Comment: Occaional use - 1-2 mixed drinks prior to stroke since d/c nothing 11/30/21   Drug use: No   Sexual activity: Yes  Other Topics Concern   Not on file  Social History Narrative   Lives at home with significant other.   Some caffeine daily I her green tea.   Right-handed.   Social Drivers of Corporate investment banker Strain: Low Risk  (04/01/2024)   Received from [facility]   Overall Financial Resource Strain (CARDIA)    Difficulty of Paying Living Expenses: Not very hard  Food Insecurity: No Food Insecurity (04/01/2024)   Received from [facility]   Hunger Vital Sign    Within the past 12 months, you worried that your food would run out before you got the money to buy more.: Never true    Within the past 12 months, the food you bought just didn't last and you didn't have money to get more.: Never true  Recent Concern: Food Insecurity - Food Insecurity Present (03/17/2024)   Received from [facility]   Hunger Vital Sign    Within the past 12 months, you worried that your food would run out before you got the money to buy more.: Sometimes true    Within the past 12 months, the food you bought just didn't last and you didn't have money to get more.: Never true  Transportation Needs: No Transportation Needs (04/01/2024)   Received from [facility] - Transportation    In the past 12 months, has lack of transportation kept you from medical appointments or from getting medications?:  No    Lack of Transportation (Non-Medical): No  Physical Activity: Insufficiently Active (03/05/2023)   Received from [facility]   Exercise Vital Sign    On average, how many days per week do you engage in moderate to strenuous exercise (like a brisk walk)?: 7 days    On average, how many minutes do you engage in exercise at this level?: 20 min  Stress: Stress Concern Present (03/05/2023)   Received from [facility] - Occupational Stress Questionnaire    Feeling of Stress : To some extent  Social Connections: Moderately Isolated (03/05/2023)   Received from [facility]   Social Connection and Isolation Panel    In a typical week, how many times do you talk on the phone with family,  friends, or neighbors?: More than three times a week    How often do you get together with friends or relatives?: More than three times a week    How often do you attend church or religious services?: More than 4 times per year    Do you belong to any clubs or organizations such as church groups, unions, fraternal or athletic groups, or school groups?: No    How often do you attend meetings of the clubs or organizations you belong to?: Never    Are you married, widowed, divorced, separated, never married, or living with a partner?: Divorced  Intimate Partner Violence: Not At Risk (01/03/2023)   Humiliation, Afraid, Rape, and Kick questionnaire    Fear of Current or Ex-Partner: No    Emotionally Abused: No    Physically Abused: No    Sexually Abused: No   ROS: All systems reviewed and negative except as per HPI.   Current Outpatient Medications  Medication Sig Dispense Refill   ACCU-CHEK GUIDE TEST test strip 1 each by Other route as needed for other.     amiodarone  (PACERONE ) 100 MG tablet Take 1 tablet (100 mg total) by mouth daily. 30 tablet 11   aspirin  EC 81 MG tablet Take 1 tablet (81 mg total) by mouth  daily. Swallow whole. 30 tablet 12   busPIRone (BUSPAR) 5 MG tablet Take 5 mg by mouth 2 (two) times daily.     cholecalciferol  (VITAMIN D3) 25 MCG (1000 UNIT) tablet Take 1,000 Units by mouth daily.     empagliflozin  (JARDIANCE ) 10 MG TABS tablet Take 1 tablet (10 mg total) by mouth daily before breakfast. 30 tablet 3   estradiol (ESTRACE) 0.1 MG/GM vaginal cream Place 1 Applicatorful vaginally daily.     gabapentin (NEURONTIN) 100 MG capsule Take 100 mg by mouth 2 (two) times daily.     Galcanezumab -gnlm (EMGALITY ) 120 MG/ML SOAJ Inject 120 mg into the skin every 30 (thirty) days. 1.12 mL 11   Multiple Vitamins-Minerals (MULTIVITAMIN WITH MINERALS) tablet Take 1 tablet by mouth daily. Woman     nortriptyline  (PAMELOR ) 25 MG capsule Take 1 capsule (25 mg total) by mouth at bedtime. 30 capsule 11   Omega-3 Fatty Acids (FISH OIL) 1000 MG CAPS Take 100 mg by mouth daily.     spironolactone  (ALDACTONE ) 25 MG tablet Take 1 tablet (25 mg total) by mouth daily. 30 tablet 5   STUDY - LIBREXIA-AF - apixaban  5 mg or placebo capsule (PI-Sethi) Take 1 capsule (5 mg total) by mouth 2 (two) times daily. Take at approximately the same time of day with or without food. Please bring bottle back with you to every visit; do not discard bottle. For Investigational Use only. 420 capsule 0   STUDY - LIBREXIA-AF - [id number] (Milvexian) 100 mg or placebo tablet (PI-Sethi) Take 1 tablet by mouth 2 (two) times daily. Take at approximately the same time of day with or without food. Please bring bottle back with you to every visit; do not discard bottle. For Investigational Use only. 420 tablet 0   torsemide  (DEMADEX ) 20 MG tablet Take 40 MG in the morning and 20 MG in the afternoon 45 tablet 5   ursodiol (ACTIGALL) 300 MG capsule Take 300 mg by mouth 3 (three) times daily.     No current facility-administered medications for this visit.   BP 102/85   Pulse 83   Wt 157 lb (71.2 kg)   SpO2 100%   BMI 26.95 kg/m  General:  Appears older than stated age Cor: JVP to midneck with HJR. Regular rate & rhythm. 2/6 TR murmur Lungs: clear Abdomen: soft, nontender, nondistended. Extremities: 1+ edema Neuro: alert & orientedx3. Affect pleasant  ReDS 30%, normal  Medtronic Device Interrogation: No AF, No VT/VF, activity 2.7 hrs/day, 98% AP/70% VP, OptiVol began trending up with downtrending thoracic impedance about a week ago   Assessment/Plan:  1. Hypertrophic cardiomyopathy: Mother with HCM, gene+ but we do not know which (testing not in [facility] system). She had a cardiac MRI in 5/21 that showed LV EF 67%, moderate asymmetric hypertrophy of the mid septum, large area of mid-wall LGE in the mid septum (>15% of myocardium), ECV 22%, RV EF 70%. Last echo in 7/25 showed EF 45-50%, mild LVH, normal RV, PASP 68 mmHg, severe LAE, moderate MR, IVC dilated.  No LVOT gradient or SAM reported.  Of note, cath in 2/24 showed minimal CAD. She has been thought to have nonobstructive HCM variant.  She has a Medtronic ICD.  RHC on 05/21/24 w/ normal right-sided filing pressures, elevated PCWP with prominent v waves, mild pulmonary venous hypertension and preserved CO. Echo 06/24/24 w/ EF 45-50%, 13 mm basal septal hypertrophy, no clear inducible gradient w/ valsava, no reported SAM, RV severely enlarged w/ RVSP 59 mmHg, severe BAE, mild to moderate MR, massive TR.  - NYHA II. Volume up on exam and by device check, recent cessation of SGLT2i likely contributing. Predominately right-sided HF. - Unable to afford Jardiance . Cost would be $400/month with copay card until deductible is met.  - increase Torsemide  to 40 mg BID, add KCL 20 mEq daily. 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 Will refer to Dr. Fairy. Encouraged her daughter, who is present, to get screened - LV EF 45-50% on last echo, fall in EF could be due to RV pacing since dual chamber ICD  placed in 3/24.  She was RV paced 70% of time on device check. May need to have her see EP in the future for consideration of device upgrade 2. HFpEF w/ RV failure: - Last echo w/ management as above 3. TR: Severe. - Last echo 10/25 as above - Discussed with Dr. Rolan. Will arrange for TEE to be completed at [facility] in [place].  Informed Consent   Shared Decision Making/Informed Consent{  The risks [esophageal damage, perforation (1:10,000 risk), bleeding, pharyngeal hematoma as well as other potential complications associated with conscious sedation including aspiration, arrhythmia, respiratory failure and death], benefits (treatment guidance and diagnostic support) and alternatives of a transesophageal echocardiogram were discussed in detail with Ms. Dombek and she is willing to proceed.      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 She is pacer dependent, A-pacing 98% and RV-pacing 70 % of the time.  5. Atrial fibrillation: Paroxysmal.  S/p AF ablation x 2 and Maze with LA appendage clipping.   - Continue amiodarone  100 mg daily.  AST/ALT have been normal recently, but ALP has been elevated. Seen at [facility]. Liver biopsy 8/25 w/ features of congestive hepatopathy. Check TSH at f/u - Continue apixaban  vs study drug.  6. S/p papillary fibroelastoma excision from aortic valve.  7. VT/NSVT: On amiodarone  and has MDT ICD.  - No recent episodes  Followup: 3-4 weeks with APP or Dr. Rolan in [facility] 06/30/2024

## 2024-06-30 NOTE — Patient Instructions (Signed)
 Medication Changes:  STOP Jardiance   INCREASE Torsemide  40mg  (2 tabs) two times daily  START Potassium 20meq (1 tab) daily  Lab Work:  Go over to the [facility]ICAL [facility]. Go pass the gift shop and have your blood work completed TODAY AND IN 1 WEEK.   We will only call you if the results are abnormal or if the provider would like to make medication changes.  No news is good news.     Testing/Procedures:  Your physician has requested that you have a TEE. During a TEE, sound waves are used to create images of your heart. It provides your doctor with information about the size and shape of your heart and how well your heart's chambers and valves are working. In this test, a transducer is attached to the end of a flexible tube that's guided down your throat and into your esophagus (the tube leading from you mouth to your stomach) to get a more detailed image of your heart. You are not awake for the procedure. Please see the instruction sheet given to you today. For further information please visit [email or web address]     Dear Hannah Zimmerman  You are scheduled for a TEE (Transesophageal Echocardiogram) on Wednesday, October 29 with Dr. Ezra Shuck.  Please arrive at the [facility] (Main Entrance A) at [facility]: [address] [place], [place] [address] at 8:00 AM (This time is 1 hour(s) before your procedure to ensure your preparation).   Free valet parking service is available. You will check in at ADMITTING.   *Please Note: You will receive a call the day before your procedure to confirm the appointment time. That time may have changed from the original time based on the schedule for that day.*   DIET:  Nothing to eat or drink after midnight except a sip of water  with medications (see medication instructions below)  MEDICATION INSTRUCTIONS: !!IF ANY NEW MEDICATIONS ARE STARTED AFTER TODAY, PLEASE NOTIFY YOUR PROVIDER AS SOON AS POSSIBLE!!  FYI: Medications such as  Semaglutide (Ozempic, [place]), Tirzepatide (Mounjaro, Zepbound), Dulaglutide (Trulicity), etc (GLP1 agonists) AND Canagliflozin (Invokana), Dapagliflozin  (Farxiga ), Empagliflozin  (Jardiance ), Ertugliflozin (Steglatro), Bexagliflozin [employer]) or any combination with one of these drugs such as Invokamet (Canagliflozin/Metformin), Synjardy (Empagliflozin /Metformin), etc (SGLT2 inhibitors) must be held around the time of a procedure. This is not a comprehensive list of all of these drugs. Please review all of your medications and talk to your provider if you take any one of these. If you are not sure, ask your provider.   DO NOT TAKE THE MORNING OF YOUR PROCEDURE: Spironolactone  or Torsemide          Continue taking your anticoagulant (blood thinner): Apixaban  (Eliquis ).  You will need to continue this after your procedure until you are told by your provider that it is safe to stop.    LABS:  Today  FYI:  For your safety, and to allow us  to monitor your vital signs accurately during the surgery/procedure we request: If you have artificial nails, gel coating, SNS etc, please have those removed prior to your surgery/procedure. Not having the nail coverings /polish removed may result in cancellation or delay of your surgery/procedure.  Your support person will be asked to wait in the waiting room during your procedure.  It is OK to have someone drop you off and come back when you are ready to be discharged.  You cannot drive after the procedure and will need someone to drive you home.  Bring your insurance cards.  *  Special Note: Every effort is made to have your procedure done on time. Occasionally there are emergencies that occur at the hospital that may cause delays. Please be patient if a delay does occur.        Follow-Up in: Please follow up with the [facility] in 3-4 weeks with Dr. Rolan.    Thank you for choosing  [facility]  [facility].    At the Advanced [facility] Failure Clinic, you and your health needs are our priority. We have a designated team specialized in the treatment of Heart Failure. This Care Team includes your primary Heart Failure Specialized Cardiologist (physician), Advanced Practice Providers (APPs- Physician Assistants and Nurse Practitioners), and Pharmacist who all work together to provide you with the care you need, when you need it.   You may see any of the following providers on your designated Care Team at your next follow up:  Dr. Toribio Fuel Dr. Ezra Rolan Dr. Ria Commander Dr. Morene Brownie Ellouise Class, FNP Jaun Bash, RPH-CPP  Please be sure to bring in all your medications bottles to every appointment.   Need to Contact Us :  If you have any questions or concerns before your next appointment please send us  a message through [facility] or call our office at [phone].    TO LEAVE A MESSAGE FOR THE NURSE SELECT OPTION 2, PLEASE LEAVE A MESSAGE INCLUDING: YOUR NAME DATE OF BIRTH CALL BACK NUMBER REASON FOR CALL**this is important as we prioritize the call backs  YOU WILL RECEIVE A CALL BACK THE SAME DAY AS LONG AS YOU CALL BEFORE 4:00 PM

## 2024-07-10 ENCOUNTER — Other Ambulatory Visit: Payer: Self-pay

## 2024-07-10 DIAGNOSIS — I50810 Right heart failure, unspecified: Secondary | ICD-10-CM

## 2024-07-10 NOTE — Progress Notes (Signed)
 Orders placed for TEE.  No precert required

## 2024-07-15 NOTE — Progress Notes (Signed)
 Called patient with pre-procedure instructions for tomorrow.   Patient informed of:   Time to arrive for procedure (0800). Remain NPO past midnight.  Must have a ride home and a responsible adult to remain with them for 24 hours post procedure. Instructed to take am meds with sip of water .  Patient reports understanding and no other questions at this time.

## 2024-07-16 ENCOUNTER — Other Ambulatory Visit: Payer: Self-pay

## 2024-07-16 ENCOUNTER — Encounter (HOSPITAL_COMMUNITY): Payer: Self-pay | Admitting: Cardiology

## 2024-07-16 ENCOUNTER — Ambulatory Visit (HOSPITAL_COMMUNITY)
Admission: RE | Admit: 2024-07-16 | Discharge: 2024-07-16 | Disposition: A | Source: Ambulatory Visit | Attending: Cardiology | Admitting: Cardiology

## 2024-07-16 ENCOUNTER — Ambulatory Visit (HOSPITAL_COMMUNITY)

## 2024-07-16 ENCOUNTER — Ambulatory Visit (HOSPITAL_COMMUNITY)
Admission: RE | Admit: 2024-07-16 | Discharge: 2024-07-16 | Disposition: A | Discharge status: Home / Self Care | Attending: Cardiology | Admitting: Cardiology

## 2024-07-16 ENCOUNTER — Encounter (HOSPITAL_COMMUNITY)
Admission: RE | Disposition: A | Discharge status: Home / Self Care | Payer: Self-pay | Source: Home / Self Care | Attending: Cardiology

## 2024-07-16 DIAGNOSIS — I509 Heart failure, unspecified: Secondary | ICD-10-CM

## 2024-07-16 DIAGNOSIS — I5032 Chronic diastolic (congestive) heart failure: Secondary | ICD-10-CM | POA: Diagnosis not present

## 2024-07-16 DIAGNOSIS — I472 Ventricular tachycardia, unspecified: Secondary | ICD-10-CM | POA: Insufficient documentation

## 2024-07-16 DIAGNOSIS — I50810 Right heart failure, unspecified: Secondary | ICD-10-CM

## 2024-07-16 DIAGNOSIS — I130 Hypertensive heart and chronic kidney disease with heart failure and stage 1 through stage 4 chronic kidney disease, or unspecified chronic kidney disease: Secondary | ICD-10-CM | POA: Diagnosis not present

## 2024-07-16 DIAGNOSIS — I252 Old myocardial infarction: Secondary | ICD-10-CM | POA: Diagnosis not present

## 2024-07-16 DIAGNOSIS — I2510 Atherosclerotic heart disease of native coronary artery without angina pectoris: Secondary | ICD-10-CM | POA: Diagnosis not present

## 2024-07-16 DIAGNOSIS — I361 Nonrheumatic tricuspid (valve) insufficiency: Secondary | ICD-10-CM

## 2024-07-16 DIAGNOSIS — Z950 Presence of cardiac pacemaker: Secondary | ICD-10-CM | POA: Insufficient documentation

## 2024-07-16 DIAGNOSIS — I422 Other hypertrophic cardiomyopathy: Secondary | ICD-10-CM | POA: Diagnosis not present

## 2024-07-16 DIAGNOSIS — Z95810 Presence of automatic (implantable) cardiac defibrillator: Secondary | ICD-10-CM | POA: Insufficient documentation

## 2024-07-16 DIAGNOSIS — I480 Paroxysmal atrial fibrillation: Secondary | ICD-10-CM | POA: Diagnosis not present

## 2024-07-16 DIAGNOSIS — Z7984 Long term (current) use of oral hypoglycemic drugs: Secondary | ICD-10-CM | POA: Diagnosis not present

## 2024-07-16 DIAGNOSIS — Z87891 Personal history of nicotine dependence: Secondary | ICD-10-CM | POA: Diagnosis not present

## 2024-07-16 DIAGNOSIS — I071 Rheumatic tricuspid insufficiency: Secondary | ICD-10-CM

## 2024-07-16 DIAGNOSIS — N183 Chronic kidney disease, stage 3 unspecified: Secondary | ICD-10-CM | POA: Insufficient documentation

## 2024-07-16 DIAGNOSIS — Z79899 Other long term (current) drug therapy: Secondary | ICD-10-CM | POA: Insufficient documentation

## 2024-07-16 SURGERY — TRANSESOPHAGEAL ECHOCARDIOGRAM (TEE) (CATHLAB)
Anesthesia: Monitor Anesthesia Care

## 2024-07-16 MED ORDER — PROPOFOL 10 MG/ML IV BOLUS
INTRAVENOUS | Status: DC | PRN
Start: 2024-07-16 — End: 2024-07-16
  Administered 2024-07-16: 50 mg via INTRAVENOUS
  Administered 2024-07-16: 100 mg via INTRAVENOUS

## 2024-07-16 MED ORDER — SODIUM CHLORIDE 0.9 % IV SOLN: INTRAVENOUS | Status: DC

## 2024-07-16 MED ORDER — LIDOCAINE HCL (CARDIAC) PF 100 MG/5ML IV SOSY
PREFILLED_SYRINGE | INTRAVENOUS | Status: DC | PRN
  Administered 2024-07-16: 60 mg via INTRAVENOUS

## 2024-07-16 MED ORDER — PHENYLEPHRINE 80 MCG/ML (10ML) SYRINGE FOR IV PUSH (FOR BLOOD PRESSURE SUPPORT)
PREFILLED_SYRINGE | INTRAVENOUS | Status: DC | PRN
  Administered 2024-07-16: 160 ug via INTRAVENOUS
  Administered 2024-07-16: 80 ug via INTRAVENOUS
  Administered 2024-07-16: 160 ug via INTRAVENOUS

## 2024-07-16 MED ORDER — PROPOFOL 500 MG/50ML IV EMUL
INTRAVENOUS | Status: DC | PRN
  Administered 2024-07-16: 150 ug/kg/min via INTRAVENOUS

## 2024-07-16 NOTE — Anesthesia Postprocedure Evaluation (Signed)
 Anesthesia Post Note  Patient: Hannah Zimmerman  Procedure(s) Performed: TRANSESOPHAGEAL ECHOCARDIOGRAM     Patient location during evaluation: PACU Anesthesia Type: MAC Level of consciousness: awake and alert Pain management: pain level controlled Vital Signs Assessment: post-procedure vital signs reviewed and stable Respiratory status: spontaneous breathing, nonlabored ventilation, respiratory function stable and patient connected to nasal cannula oxygen Cardiovascular status: blood pressure returned to baseline and stable Postop Assessment: no apparent nausea or vomiting Anesthetic complications: no   No notable events documented.  Last Vitals:  Vitals:   07/16/24 0955 07/16/24 1000  BP: 97/71 92/70  Pulse: 70 72  Resp: 14 11  Temp:    SpO2: 97% 99%    Last Pain:  Vitals:   07/16/24 0950  TempSrc:   PainSc: 0-No pain                 [name]

## 2024-07-16 NOTE — Transfer of Care (Signed)
 Immediate Anesthesia Transfer of Care Note  Patient: Hannah Zimmerman  Procedure(s) Performed: TRANSESOPHAGEAL ECHOCARDIOGRAM  Patient Location: PACU and Cath Lab  Anesthesia Type:MAC  Level of Consciousness: drowsy  Airway & Oxygen Therapy: Patient Spontanous Breathing and Patient connected to nasal cannula oxygen  Post-op Assessment: Report given to RN and Post -op Vital signs reviewed and stable  Post vital signs: Reviewed and stable  Last Vitals:  Vitals Value Taken Time  BP 88/64 0930  Temp 97.8   Pulse 66   Resp 18   SpO2 96     Last Pain:  Vitals:   07/16/24 0823  TempSrc:   PainSc: 0-No pain         Complications: No notable events documented.

## 2024-07-16 NOTE — Anesthesia Procedure Notes (Signed)
 Procedure Name: MAC Date/Time: 07/16/2024 9:02 AM  Performed by: Viviana Almarie DASEN, CRNAPre-anesthesia Checklist: Patient identified, Suction available, Patient being monitored, Emergency Drugs available and Timeout performed Patient Re-evaluated:Patient Re-evaluated prior to induction Oxygen Delivery Method: Nasal cannula Induction Type: IV induction Placement Confirmation: positive ETCO2

## 2024-07-16 NOTE — CV Procedure (Signed)
 Procedure: TEE  Sedation: Per anesthesiology  Indication: Severe TR by echo  Finding: Please see echo section for full report.  Normal LV size with moderate asymmetric basal septal hypertrophy, EF appears to be around 50% with septal-lateral dyssynchrony.  Mildly dilated RV size with normal systolic function. Peak RV-RA gradient 43 mmHg.  Moderate left atrial enlargement, the LA appendage appears to have been surgically ligated.  Moderate right atrial enlargement.  No ASD/PFO, bubble study negative.  Pacemaker in RV/RA.  There was moderate-severe tricuspid regurgitation.  Trivial mitral regurgitation, no mitral valve systolic anterior motion.  Trileaflet aortic valve with no stenosis or regurgitation.  There was no LVOT gradient.   Impression: Moderate-severe TR with preserved RV function.   Hannah Zimmerman 07/16/2024 9:33 AM

## 2024-07-16 NOTE — Anesthesia Preprocedure Evaluation (Addendum)
 Anesthesia Evaluation  Patient identified by MRN, date of birth, ID band Patient awake    Reviewed: Allergy & Precautions, NPO status , Patient's Chart, lab work & pertinent test results, reviewed documented beta blocker date and time   History of Anesthesia Complications (+) history of anesthetic complications  Airway Mallampati: II  TM Distance: >3 FB     Dental  (+) Chipped, Missing   Pulmonary neg COPD, former smoker   breath sounds clear to auscultation       Cardiovascular + CAD, + Past MI and +CHF  (-) Cardiac Stents and (-) CABG + dysrhythmias Ventricular Tachycardia + Valvular Problems/Murmurs (severe TR)  Rhythm:Regular Rate:Normal  Echo 06/24/24 w/ EF 45-50%, 13 mm basal septal hypertrophy, no clear inducible gradient w/ valsava, RV severely enlarged w/ RVSP 59 mmHg, severe BAE, mild to moderate MR, massive TR   Neuro/Psych neg Seizures PSYCHIATRIC DISORDERS  Depression    CVA, Residual Symptoms    GI/Hepatic hiatal hernia,,,(+) neg Cirrhosis        Endo/Other    Renal/GU Renal disease     Musculoskeletal   Abdominal   Peds  Hematology  (+) Blood dyscrasia, anemia   Anesthesia Other Findings   Reproductive/Obstetrics                              Anesthesia Physical Anesthesia Plan  ASA: 3  Anesthesia Plan: MAC   Post-op Pain Management:    Induction: Intravenous  PONV Risk Score and Plan: 2 and Ondansetron  and Dexamethasone   Airway Management Planned:   Additional Equipment:   Intra-op Plan:   Post-operative Plan:   Informed Consent: I have reviewed the patients History and Physical, chart, labs and discussed the procedure including the risks, benefits and alternatives for the proposed anesthesia with the patient or authorized representative who has indicated his/her understanding and acceptance.     Dental advisory given  Plan Discussed with:  CRNA  Anesthesia Plan Comments:         Anesthesia Quick Evaluation

## 2024-07-18 LAB — ECHO TEE: Est EF: 50

## 2024-07-23 ENCOUNTER — Ambulatory Visit (HOSPITAL_COMMUNITY)
Admission: RE | Admit: 2024-07-23 | Discharge: 2024-07-23 | Disposition: A | Discharge status: Home / Self Care | Source: Ambulatory Visit | Attending: Cardiology | Admitting: Cardiology

## 2024-07-23 ENCOUNTER — Other Ambulatory Visit (HOSPITAL_COMMUNITY): Payer: Self-pay

## 2024-07-23 VITALS — BP 96/66 | HR 76 | Wt 149.2 lb

## 2024-07-23 DIAGNOSIS — Z7901 Long term (current) use of anticoagulants: Secondary | ICD-10-CM | POA: Insufficient documentation

## 2024-07-23 DIAGNOSIS — I472 Ventricular tachycardia, unspecified: Secondary | ICD-10-CM | POA: Diagnosis not present

## 2024-07-23 DIAGNOSIS — I130 Hypertensive heart and chronic kidney disease with heart failure and stage 1 through stage 4 chronic kidney disease, or unspecified chronic kidney disease: Secondary | ICD-10-CM | POA: Insufficient documentation

## 2024-07-23 DIAGNOSIS — K761 Chronic passive congestion of liver: Secondary | ICD-10-CM | POA: Diagnosis not present

## 2024-07-23 DIAGNOSIS — R7989 Other specified abnormal findings of blood chemistry: Secondary | ICD-10-CM | POA: Diagnosis not present

## 2024-07-23 DIAGNOSIS — Z87891 Personal history of nicotine dependence: Secondary | ICD-10-CM | POA: Insufficient documentation

## 2024-07-23 DIAGNOSIS — Z79899 Other long term (current) drug therapy: Secondary | ICD-10-CM | POA: Insufficient documentation

## 2024-07-23 DIAGNOSIS — I081 Rheumatic disorders of both mitral and tricuspid valves: Secondary | ICD-10-CM | POA: Diagnosis not present

## 2024-07-23 DIAGNOSIS — I5032 Chronic diastolic (congestive) heart failure: Secondary | ICD-10-CM | POA: Insufficient documentation

## 2024-07-23 DIAGNOSIS — I422 Other hypertrophic cardiomyopathy: Secondary | ICD-10-CM | POA: Insufficient documentation

## 2024-07-23 DIAGNOSIS — I480 Paroxysmal atrial fibrillation: Secondary | ICD-10-CM | POA: Diagnosis not present

## 2024-07-23 DIAGNOSIS — I272 Pulmonary hypertension, unspecified: Secondary | ICD-10-CM | POA: Diagnosis not present

## 2024-07-23 DIAGNOSIS — Z95810 Presence of automatic (implantable) cardiac defibrillator: Secondary | ICD-10-CM | POA: Diagnosis not present

## 2024-07-23 DIAGNOSIS — Z8673 Personal history of transient ischemic attack (TIA), and cerebral infarction without residual deficits: Secondary | ICD-10-CM | POA: Diagnosis not present

## 2024-07-23 LAB — COMPREHENSIVE METABOLIC PANEL WITH GFR
ALT: 20 U/L (ref 0–44)
AST: 33 U/L (ref 15–41)
Albumin: 3.9 g/dL (ref 3.5–5.0)
Alkaline Phosphatase: 102 U/L (ref 38–126)
Anion gap: 13 (ref 5–15)
BUN: 29 mg/dL — ABNORMAL HIGH (ref 6–20)
CO2: 26 mmol/L (ref 22–32)
Calcium: 9 mg/dL (ref 8.9–10.3)
Chloride: 97 mmol/L — ABNORMAL LOW (ref 98–111)
Creatinine, Ser: 1.83 mg/dL — ABNORMAL HIGH (ref 0.44–1.00)
GFR, Estimated: 33 mL/min — ABNORMAL LOW (ref >60)
Glucose, Bld: 80 mg/dL (ref 70–99)
Potassium: 3.8 mmol/L (ref 3.5–5.1)
Sodium: 136 mmol/L (ref 135–145)
Total Bilirubin: 0.7 mg/dL (ref 0.0–1.2)
Total Protein: 7.3 g/dL (ref 6.5–8.1)

## 2024-07-23 LAB — TSH: TSH: 6.006 u[IU]/mL — ABNORMAL HIGH (ref 0.350–4.500)

## 2024-07-23 LAB — BRAIN NATRIURETIC PEPTIDE: B Natriuretic Peptide: 571.8 pg/mL — ABNORMAL HIGH (ref 0.0–100.0)

## 2024-07-23 NOTE — Patient Instructions (Addendum)
 There has been no changes to your medications.   Labs done today, your results will be available in MyChart, we will contact you for abnormal readings.  Your physician recommends that you schedule a follow-up appointment in: 3 months.  If you have any questions or concerns before your next appointment please send us  a message through [place] or call our office at [phone].    TO LEAVE A MESSAGE FOR THE NURSE SELECT OPTION 2, PLEASE LEAVE A MESSAGE INCLUDING: YOUR NAME DATE OF BIRTH CALL BACK NUMBER REASON FOR CALL**this is important as we prioritize the call backs  YOU WILL RECEIVE A CALL BACK THE SAME DAY AS LONG AS YOU CALL BEFORE 4:00 PM  At the [facility], you and your health needs are our priority. As part of our continuing mission to provide you with exceptional heart care, we have created designated Provider Care Teams. These Care Teams include your primary Cardiologist (physician) and Advanced Practice Providers (APPs- Physician Assistants and Nurse Practitioners) who all work together to provide you with the care you need, when you need it.   You may see any of the following providers on your designated Care Team at your next follow up: Dr Toribio Fuel Dr Ezra Shuck Dr. Morene Brownie Greig Mosses, NP Caffie Shed, [place] [facility] [place], [place] Beckey Coe, NP Jordan Lee, NP Ellouise Class, NP Tinnie Redman, PharmD Jaun Bash, PharmD   Please be sure to bring in all your medications bottles to every appointment.    Thank you for choosing [facility]

## 2024-07-24 ENCOUNTER — Ambulatory Visit (HOSPITAL_COMMUNITY): Payer: Self-pay | Admitting: Cardiology

## 2024-07-24 NOTE — Progress Notes (Signed)
 PCP: Cyrus Selinda Moose, PA-C Cardiology: Dr. Perla HF Cardiology: Dr. Rolan  Chief complaint: CHF  53 y.o. with history of hypertrophic cardiomyopathy, VT, bradycardia with dual chamber Medtronic ICD, paroxysmal atrial fibrillation, papillary fibroelastoma s/p resection was referred by Dr. Gollan for CHF evaluation.  Patient carries history of HCM, in the past imaging has shown nonobstructive HCM.  She tells me that her mother has HCM; they both carry a pathogenic MYBPC3 variant.  She had a cardiac MRI in 5/21 that showed LV EF 67%, moderate asymmetric hypertrophy of the mid septum, large area of mid-wall LGE in the mid septum, ECV 22%, RV EF 70%. Last echo in 7/25 showed EF 45-50%, mild LVH, normal RV, PASP 68 mmHg, severe LAE, moderate MR, IVC dilated.  No LVOT gradient or SAM reported.  Cath in 2/24 showed no significant CAD.  She had AF ablations in 4/21 and 11/22.  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  She had surgery in 2023 with fibroelastoma excision as well as Maze and LA appendage clip.  She was noted to have junctional bradycardia and long pauses in 3/24, Medtronic dual chamber PPM was placed.   Liver biopsy done with elevated LFTs in 8/25, this showed congestive hepatopathy.   Underwent RHC on 05/21/24 w/ normal right-sided filing pressures, elevated PCWP with prominent v waves, mild pulmonary venous hypertension and preserved CO. Echo 06/24/24 with EF 45-50%, mild-moderate asymmetric basal septal hypertrophy, no clear inducible gradient w/ valsava, RV severely enlarged w/ RVSP 59 mmHg, severe BAE, mild to moderate MR, massive TR.  Given severe TR, she had TEE done in 10/25.  This showed EF 50%, septal-lateral dyssynchrony, moderate asymmetric hypertrophy of the basal septum with no LVOT gradient or mitral valve SAM, no MR, RV-RA gradient 43 mmHg, mild RV enlargement with normal RV systolic function, moderate-severe TR.   She is here today  for CHF follow-up. She is accompanied by her daughter. Unable to get affordable coverage for SGLT2 inhibitor.   Overall feeling good since torsemide  increased.  No current exertional dyspnea.  No chest pain.  No orthopnea/PND.  No lightheadedness or palpitations.   Medtronic device interrogation: No AF/VT, 64% v-paced, 96% a-paced, stable thoracic impedance.   Labs (10/25): BNP 806, K 3.8, creatinine 1.32  PMH: 1. VT/NSVT: Medtronic dual chamber ICD.  2. Atrial fibrillation: S/p ablations in 4/21 and 11/22, s/p Maze and LA appendage clip in 2023.  3. Papillary fibroelastoma: Aortic valve, s/p surgical excision in 2023.  The fibroelastoma was associated with CVA.  4. Bradycardia: With junctional rhythm and pauses up to 8.2 seconds.   - Medtronic dual chamber ICD placed in 3/24.  5. Obesity: Gastric bypass in 2016.  6. HTN 7. 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  8. Hypertrophic cardiomyopathy: She had a pathogenic MYBPC3 mutation as does her mother. Nonobstructive HCM variant.  - Cardiac MRI (5/21): LV EF 67%, moderate asymmetric hypertrophy of the mid septum, large area of mid-wall LGE in the mid septum, ECV 22%, RV EF 70%.  - Echo (7/25): EF 45-50%, mild LVH, normal RV, PASP 68 mmHg, severe LAE, moderate MR, IVC dilated.  No LVOT gradient or SAM reported.  - RHC (9/25): mean RA 5, PA 44/26 mean 29, mean PCWP 18 with v-waves to 28, CI 2.48, PVR 2.56.  - Echo (10/25): EF 45-50%, mild-moderate asymmetric septal hypertrophy, no LVOT gradient at rest or with valsalva, no mitral valve SAM, mild-moderate MR, severe RV enlargement with  normal RV systolic function, PASP 59 mmHg, severe biatrial enlargement, severe TR.  - TEE (10/25): EF 50%, septal-lateral dyssynchrony, moderate asymmetric hypertrophy of the basal septum with no LVOT gradient or mitral valve SAM, no MR, RV-RA gradient 43 mmHg, mild RV enlargement with normal RV systolic function, moderate-severe TR.  9. Elevated LFTs: Liver biopsy in  8/25 with congestive hepatopathy.  10. CKD stage 3  FH: Mother with HCM, gene+.  Two daughters have POTS and Ehlers-Danlos syndrome.   Social History   Socioeconomic History   Marital status: Significant Other    Spouse name: Not on file   Number of children: 3   Years of education: two years of college   Highest education level: Not on file  Occupational History   Occupation: office assistant  Tobacco Use   Smoking status: Former    Current packs/day: 0.00    Average packs/day: 0.3 packs/day for 31.0 years (7.8 ttl pk-yrs)    Types: Cigarettes    Start date: 10/04/1991    Quit date: 10/03/2022    Years since quitting: 1.8    Passive exposure: Current   Smokeless tobacco: Never  Vaping Use   Vaping status: Every Day  Substance and Sexual Activity   Alcohol use: Yes    Alcohol/week: 1.0 - 2.0 standard drink of alcohol    Types: 1 - 2 Standard drinks or equivalent per week    Comment: Occaional use - 1-2 mixed drinks prior to stroke since d/c nothing 11/30/21   Drug use: No   Sexual activity: Yes  Other Topics Concern   Not on file  Social History Narrative   Lives at home with significant other.   Some caffeine daily I her green tea.   Right-handed.   Social Drivers of Corporate Investment Banker Strain: Low Risk  (04/01/2024)   Received from [facility]   Overall Financial Resource Strain (CARDIA)    Difficulty of Paying Living Expenses: Not very hard  Food Insecurity: No Food Insecurity (04/01/2024)   Received from [facility]   Hunger Vital Sign    Within the past 12 months, you worried that your food would run out before you got the money to buy more.: Never true    Within the past 12 months, the food you bought just didn't last and you didn't have money to get more.: Never true  Recent Concern: Food Insecurity - Food Insecurity Present (03/17/2024)   Received from [facility]   Hunger Vital Sign    Within the  past 12 months, you worried that your food would run out before you got the money to buy more.: Sometimes true    Within the past 12 months, the food you bought just didn't last and you didn't have money to get more.: Never true  Transportation Needs: No Transportation Needs (04/01/2024)   Received from [facility] - Transportation    In the past 12 months, has lack of transportation kept you from medical appointments or from getting medications?: No    Lack of Transportation (Non-Medical): No  Physical Activity: Insufficiently Active (03/05/2023)   Received from [facility]   Exercise Vital Sign    On average, how many days per week do you engage in moderate to strenuous exercise (like a brisk walk)?: 7 days    On average, how many minutes do you engage in exercise at this level?: 20 min  Stress:  Stress Concern Present (03/05/2023)   Received from [facility] - Occupational Stress Questionnaire    Feeling of Stress : To some extent  Social Connections: Moderately Isolated (03/05/2023)   Received from [facility]   Social Connection and Isolation Panel    In a typical week, how many times do you talk on the phone with family, friends, or neighbors?: More than three times a week    How often do you get together with friends or relatives?: More than three times a week    How often do you attend church or religious services?: More than 4 times per year    Do you belong to any clubs or organizations such as church groups, unions, fraternal or athletic groups, or school groups?: No    How often do you attend meetings of the clubs or organizations you belong to?: Never    Are you married, widowed, divorced, separated, never married, or living with a partner?: Divorced  Intimate Partner Violence: Not At Risk (01/03/2023)   Humiliation, Afraid, Rape, and Kick questionnaire     Fear of Current or Ex-Partner: No    Emotionally Abused: No    Physically Abused: No    Sexually Abused: No   ROS: All systems reviewed and negative except as per HPI.   Current Outpatient Medications  Medication Sig Dispense Refill   ACCU-CHEK GUIDE TEST test strip 1 each by Other route as needed for other.     amiodarone  (PACERONE ) 100 MG tablet Take 1 tablet (100 mg total) by mouth daily. 30 tablet 11   aspirin  EC 81 MG tablet Take 1 tablet (81 mg total) by mouth daily. Swallow whole. 30 tablet 12   busPIRone (BUSPAR) 5 MG tablet Take 5 mg by mouth 2 (two) times daily.     cholecalciferol  (VITAMIN D3) 25 MCG (1000 UNIT) tablet Take 1,000 Units by mouth daily.     Multiple Vitamins-Minerals (MULTIVITAMIN WITH MINERALS) tablet Take 1 tablet by mouth daily. Woman     Omega-3 Fatty Acids (FISH OIL) 1000 MG CAPS Take 100 mg by mouth daily.     potassium chloride  (KLOR-CON  M) 20 MEQ tablet Take 1 tablet (20 mEq total) by mouth daily. 90 tablet 1   spironolactone  (ALDACTONE ) 25 MG tablet Take 1 tablet (25 mg total) by mouth daily. 30 tablet 5   STUDY - LIBREXIA-AF - apixaban  5 mg or placebo capsule (PI-Sethi) Take 1 capsule (5 mg total) by mouth 2 (two) times daily. Take at approximately the same time of day with or without food. Please bring bottle back with you to every visit; do not discard bottle. For Investigational Use only. 420 capsule 0   STUDY - LIBREXIA-AF - [id number] (Milvexian) 100 mg or placebo tablet (PI-Sethi) Take 1 tablet by mouth 2 (two) times daily. Take at approximately the same time of day with or without food. Please bring bottle back with you to every visit; do not discard bottle. For Investigational Use only. 420 tablet 0   torsemide  (DEMADEX ) 20 MG tablet Take 2 tablets (40 mg total) by mouth 2 (two) times daily. 120 tablet 5   estradiol (ESTRACE) 0.1 MG/GM vaginal cream Place 1 Applicatorful vaginally daily. (Patient not taking: Reported on 07/23/2024)     gabapentin  (NEURONTIN) 100 MG capsule Take 100 mg by mouth 2 (two) times daily.     Galcanezumab -gnlm (EMGALITY ) 120 MG/ML SOAJ Inject 120 mg into the skin every  30 (thirty) days. 1.12 mL 11   nortriptyline  (PAMELOR ) 25 MG capsule Take 1 capsule (25 mg total) by mouth at bedtime. 30 capsule 11   ursodiol (ACTIGALL) 300 MG capsule Take 300 mg by mouth 3 (three) times daily.     No current facility-administered medications for this encounter.   BP 96/66   Pulse 76   Wt 67.7 kg (149 lb 3.2 oz)   SpO2 97%   BMI 25.61 kg/m  General: NAD Neck: No JVD, no thyromegaly or thyroid  nodule.  Lungs: Clear to auscultation bilaterally with normal respiratory effort. CV: Nondisplaced PMI.  Heart regular S1/S2, no S3/S4, 1/6 HSM LLSB.  No peripheral edema.  No carotid bruit.  Normal pedal pulses.  Abdomen: Soft, nontender, no hepatosplenomegaly, no distention.  Skin: Intact without lesions or rashes.  Neurologic: Alert and oriented x 3.  Psych: Normal affect. Extremities: No clubbing or cyanosis.  HEENT: Normal.   Assessment/Plan:  1. Hypertrophic cardiomyopathy: She had a pathogenic MYBPC3 mutation as does her mother. Nonobstructive HCM variant.  She had a cardiac MRI in 5/21 that showed LV EF 67%, moderate asymmetric hypertrophy of the mid septum, large area of mid-wall LGE in the mid septum (>15% of myocardium), ECV 22%, RV EF 70%.  She has a Medtronic ICD.  She has not been found to have LV outflow gradient or mitral valve SAM. Last study was a TEE in 10/25 showing EF 50%, septal-lateral dyssynchrony, moderate asymmetric hypertrophy of the basal septum with no LVOT gradient or mitral valve SAM, no MR, RV-RA gradient 43 mmHg, mild RV enlargement with normal RV systolic function, moderate-severe TR.  She is not currently volume overloaded by exam or Optivol, NYHA class I-II.  - Unable to obtain SGLT2 inhibitor as it is prohibitively expensive via her insurance.  - Continue torsemide  40 mg bid, BMET/BNP today.  -  Continue spironolactone  25 mg daily - She has an appointment with Dr. Fairy for genetic counseling (MYBPC3 mutation).  - She is RV pacing 64% of the time with mildly depressed EF and CHF.  She will see Dr. Inocencio in February, would consider CRT upgrade.  2. Tricuspid regurgitation: Moderate to severe with mild RV enlargement on TEE in 10/25.  Better control of RV failure with higher dose of torsemide , currently NYHA class I-II.  Will need to follow this closely.  She has had a prior sternotomy for removal of aortic fibroelastoma, so repeat sternotomy for tricuspid repair would be high risk. She may be a candidate for a percutaneous tricuspid repair in future though she has an ICD lead across the valve.   3. Junctional bradycardia/long pauses: Medtronic dual chamber PPM. She is pacer dependent, A-pacing 96% and RV-pacing 64% of the time.  - As above, consider CRT upgrade with mildly decreased EF and CHF.  4. Atrial fibrillation: Paroxysmal.  S/p AF ablation x 2 and Maze with LA appendage clipping.  A-paced today.  - Continue amiodarone  100 mg daily.  Check LFTs and TSH today.  She will need a regular eye exam.  - Continue apixaban  vs study drug.  5. S/p papillary fibroelastoma excision from aortic valve. AoV looked ok on 10/25 TEE.  6. VT/NSVT: On amiodarone  and has MDT ICD.  - No recent episodes 7. H/o elevated LFTs: Biopsy in 8/25 showed congestive hepatopathy, do not think this was due to amiodarone .  - Check LFTs today.   Followup 3 months   I spent 31 minutes reviewing records, interviewing/examining patient, and managing orders.  Hannah Zimmerman 07/24/2024

## 2024-07-25 ENCOUNTER — Telehealth (HOSPITAL_COMMUNITY): Payer: Self-pay | Admitting: *Deleted

## 2024-07-25 DIAGNOSIS — I5032 Chronic diastolic (congestive) heart failure: Secondary | ICD-10-CM

## 2024-07-25 MED ORDER — TORSEMIDE 20 MG PO TABS: ORAL_TABLET | ORAL | 3 refills | Status: AC

## 2024-07-25 NOTE — Telephone Encounter (Signed)
 Called patient per Dr. Rolan with following lab results and instructions:  Creatinine up, decrease torsemide  to 40 qam/20 qpm. BMET in 10 days.  Pt verbalized understanding of same. She will get labs at [facility] in 10 days. No appointment necessary, lab orders entered.

## 2024-07-29 ENCOUNTER — Ambulatory Visit: Attending: Genetic Counselor | Admitting: Genetic Counselor

## 2024-07-29 DIAGNOSIS — I422 Other hypertrophic cardiomyopathy: Secondary | ICD-10-CM

## 2024-07-29 NOTE — Progress Notes (Deleted)
   Post-test Genetic Consultation notes   Hannah Zimmerman is here today for his HCM post-test genetic consult. He tells me that his symptom shave worsened since I last met him in December, 2024. His dyspnea and fatigue has worsened significantly but he continues to work. Also reports having cardioversion for Afib in January and being treated with Lasix  for fluid buildup in February.  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 I explained to the patient that this variant leads to gene splicing defect that ultimately results in haploinsufficiency where the defective protein is removed from the cells. This is a known MYBPC3 functional mechanism that causes HCM. In addition, this mutation has been reported in several patients with HCM and has been found to segregate with disease in an affected family.   As this is an autosomal dominant condition, his son is at a 50% risk of inheriting the familial MYBPC3 pathogenic variant. It is important that his son and younger sister undergo genetic consult and testing for this familial mutation. He expressed understanding and will discuss this with them. He tells me that his son has no functional limitations and is currently asymptomatic, walking nearly 3 miles daily. Genetic testing for this variant will confirm their genetic status and requirement for HCM screening.   Clinical screening involves echocardiogram and EKG at regular intervals, frequency is typically determined by age, with those e over the age of 12 getting screened every 3-5 years.   Reiterated the protections afforded by [employer] so he can discuss this with his son and sister. He verbalized understanding.  Danford Pac, Ph.D, [facility] Clinical Molecular Geneticist

## 2024-07-29 NOTE — Progress Notes (Cosign Needed Addendum)
 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   It is recommended that her daughters and siblings undergo genetic testing for this familial pathogenic variant to determine if they inherited this variant. Relatives that have this pathogenic variant should seek regular cardiac screening for HCM.  Post-test Genetic Consultation notes   Akita Maxim is here today for her HCM post-test genetic consult. Her daughter, Natalie is here too for this visit.  This is a telemedicine visit- patient identity was confirmed with two unique identifiers.   Delcie tells me that she underwent a free genetic test for HCM and was found to have a mutation in MYBPC3 gene. A copy of the report is not posted in EPIC and she does not have a copy of her test result. Her notes indicate that she and mother with CHF harbor  the same pathogenic variant. However, could not find a copy of her mother's test report.  Explained to her that the test report needs to be reviewed to ensure that it is clinically actionable. If the report detected a true pathogenic variant, then as HCM is an autosomal dominant condition, her children are at a 50% risk of inheriting the familial MYBPC3 pathogenic variant.   She tells me that her 29 y.o. son was tragically involved in a motorbike accident a couple of months ago. However, both her daughters- elder- Mitzie and he younger, Laneta are open to undergo screening for HCM and genetic testing if required. Laneta had a normal echocardiogram and EKG in September 224 and Mitzie had a normal screen 6 months ago. She tells me that her siblings, one a nurse and the other a lab tech, did not express interest in being screened as they are currently asymptomatic.  Genetic testing for this variant will confirm their genetic status and  requirement for HCM screening. Clinical screening involves echocardiogram and EKG at regular intervals, frequency is typically determined by age, with those e over the age of 41 getting screened every 3-5 years.   Reiterated the protections afforded by G[employer] so she can discuss this with her daughters and siblings. She verbalized understanding. Meanwhile, I have reached out to Dr. Rolan to have he test report uploaded in EPIC.  Danford Pac, Ph.D, [facility] Clinical Molecular Geneticist

## 2024-07-30 ENCOUNTER — Other Ambulatory Visit (HOSPITAL_COMMUNITY): Payer: Self-pay | Admitting: Cardiology

## 2024-08-01 ENCOUNTER — Encounter: Payer: Self-pay | Admitting: Cardiology

## 2024-08-07 ENCOUNTER — Encounter: Payer: Self-pay | Admitting: Cardiology

## 2024-09-19 ENCOUNTER — Ambulatory Visit (INDEPENDENT_AMBULATORY_CARE_PROVIDER_SITE_OTHER): Payer: 59

## 2024-09-19 DIAGNOSIS — I422 Other hypertrophic cardiomyopathy: Secondary | ICD-10-CM

## 2024-09-21 LAB — CUP PACEART REMOTE DEVICE CHECK
Battery Remaining Longevity: 103 mo
Battery Voltage: 2.99 V
Brady Statistic AP VP Percent: 72.5 %
Brady Statistic AP VS Percent: 21.78 %
Brady Statistic AS VP Percent: 0.19 %
Brady Statistic AS VS Percent: 5.53 %
Brady Statistic RA Percent Paced: 98.14 %
Brady Statistic RV Percent Paced: 72.69 %
Date Time Interrogation Session: 20260101231300
HighPow Impedance: 73 Ohm
Implantable Lead Connection Status: 753985
Implantable Lead Connection Status: 753985
Implantable Lead Implant Date: 20240329
Implantable Lead Implant Date: 20240329
Implantable Lead Location: 753859
Implantable Lead Location: 753860
Implantable Lead Model: 5076
Implantable Pulse Generator Implant Date: 20240329
Lead Channel Impedance Value: 323 Ohm
Lead Channel Impedance Value: 418 Ohm
Lead Channel Impedance Value: 456 Ohm
Lead Channel Pacing Threshold Amplitude: 0.875 V
Lead Channel Pacing Threshold Amplitude: 1.125 V
Lead Channel Pacing Threshold Pulse Width: 0.4 ms
Lead Channel Pacing Threshold Pulse Width: 0.4 ms
Lead Channel Sensing Intrinsic Amplitude: 1.4 mV
Lead Channel Sensing Intrinsic Amplitude: 12 mV
Lead Channel Setting Pacing Amplitude: 1.75 V
Lead Channel Setting Pacing Amplitude: 1.75 V
Lead Channel Setting Pacing Pulse Width: 0.4 ms
Lead Channel Setting Sensing Sensitivity: 0.3 mV
Zone Setting Status: 755011
Zone Setting Status: 755011
Zone Setting Status: 755011

## 2024-09-22 ENCOUNTER — Ambulatory Visit: Payer: Self-pay | Admitting: Cardiology

## 2024-09-25 NOTE — Progress Notes (Signed)
 Remote ICD Transmission

## 2024-10-16 NOTE — Progress Notes (Incomplete)
 "  ADVANCED HF CLINIC NOTE   PCP: Cyrus Selinda Moose, PA-C Cardiology: Dr. Perla HF Cardiology: Dr. Rolan  Chief complaint: CHF  54 y.o. with history of hypertrophic cardiomyopathy, VT, bradycardia with dual chamber Medtronic ICD, paroxysmal atrial fibrillation, papillary fibroelastoma s/p resection was referred by Dr. Gollan for CHF evaluation.  Patient carries history of HCM, in the past imaging has shown nonobstructive HCM.  She tells me that her mother has HCM; they both carry a pathogenic MYBPC3 variant.  She had a cardiac MRI in 5/21 that showed LV EF 67%, moderate asymmetric hypertrophy of the mid septum, large area of mid-wall LGE in the mid septum, ECV 22%, RV EF 70%. Last echo in 7/25 showed EF 45-50%, mild LVH, normal RV, PASP 68 mmHg, severe LAE, moderate MR, IVC dilated.  No LVOT gradient or SAM reported.  Cath in 2/24 showed no significant CAD.  She had AF ablations in 4/21 and 11/22.  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  She had surgery in 2023 with fibroelastoma excision as well as Maze and LA appendage clip.  She was noted to have junctional bradycardia and long pauses in 3/24, Medtronic dual chamber PPM was placed.   Liver biopsy done with elevated LFTs in 8/25, this showed congestive hepatopathy.   Underwent RHC on 05/21/24 w/ normal right-sided filing pressures, elevated PCWP with prominent v waves, mild pulmonary venous hypertension and preserved CO. Echo 06/24/24 with EF 45-50%, mild-moderate asymmetric basal septal hypertrophy, no clear inducible gradient w/ valsava, RV severely enlarged w/ RVSP 59 mmHg, severe BAE, mild to moderate MR, massive TR.  Given severe TR, she had TEE done in 10/25.  This showed EF 50%, septal-lateral dyssynchrony, moderate asymmetric hypertrophy of the basal septum with no LVOT gradient or mitral valve SAM, no MR, RV-RA gradient 43 mmHg, mild RV enlargement with normal RV systolic function,  moderate-severe TR.   Today she returns for AHF follow up. Overall feeling ***. Denies palpitations, CP, dizziness, edema, or PND/Orthopnea. *** SOB. Appetite ok. No fever or chills. Weight at home *** pounds. Taking all medications. Denies ETOH, tobacco or drug use.   Medtronic device interrogation: No AF/VT, 64% v-paced, 96% a-paced, stable thoracic impedance.    PMH: 1. VT/NSVT: Medtronic dual chamber ICD.  2. Atrial fibrillation: S/p ablations in 4/21 and 11/22, s/p Maze and LA appendage clip in 2023.  3. Papillary fibroelastoma: Aortic valve, s/p surgical excision in 2023.  The fibroelastoma was associated with CVA.  4. Bradycardia: With junctional rhythm and pauses up to 8.2 seconds.   - Medtronic dual chamber ICD placed in 3/24.  5. Obesity: Gastric bypass in 2016.  6. HTN 7. 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  8. Hypertrophic cardiomyopathy: She had a pathogenic MYBPC3 mutation as does her mother. Nonobstructive HCM variant.  - Cardiac MRI (5/21): LV EF 67%, moderate asymmetric hypertrophy of the mid septum, large area of mid-wall LGE in the mid septum, ECV 22%, RV EF 70%.  - Echo (7/25): EF 45-50%, mild LVH, normal RV, PASP 68 mmHg, severe LAE, moderate MR, IVC dilated.  No LVOT gradient or SAM reported.  - RHC (9/25): mean RA 5, PA 44/26 mean 29, mean PCWP 18 with v-waves to 28, CI 2.48, PVR 2.56.  - Echo (10/25): EF 45-50%, mild-moderate asymmetric septal hypertrophy, no LVOT gradient at rest or with valsalva, no mitral valve SAM, mild-moderate MR, severe RV enlargement with normal RV systolic function, PASP 59 mmHg,  severe biatrial enlargement, severe TR.  - TEE (10/25): EF 50%, septal-lateral dyssynchrony, moderate asymmetric hypertrophy of the basal septum with no LVOT gradient or mitral valve SAM, no MR, RV-RA gradient 43 mmHg, mild RV enlargement with normal RV systolic function, moderate-severe TR.  9. Elevated LFTs: Liver biopsy in 8/25 with congestive hepatopathy.  10. CKD  stage 3  FH: Mother with HCM, gene+.  Two daughters have POTS and Ehlers-Danlos syndrome.   Social History   Socioeconomic History   Marital status: Significant Other    Spouse name: Not on file   Number of children: 3   Years of education: two years of college   Highest education level: Not on file  Occupational History   Occupation: office assistant  Tobacco Use   Smoking status: Former    Current packs/day: 0.00    Average packs/day: 0.3 packs/day for 31.0 years (7.8 ttl pk-yrs)    Types: Cigarettes    Start date: 10/04/1991    Quit date: 10/03/2022    Years since quitting: 2.0    Passive exposure: Current   Smokeless tobacco: Never  Vaping Use   Vaping status: Every Day  Substance and Sexual Activity   Alcohol use: Yes    Alcohol/week: 1.0 - 2.0 standard drink of alcohol    Types: 1 - 2 Standard drinks or equivalent per week    Comment: Occaional use - 1-2 mixed drinks prior to stroke since d/c nothing 11/30/21   Drug use: No   Sexual activity: Yes  Other Topics Concern   Not on file  Social History Narrative   Lives at home with significant other.   Some caffeine daily I her green tea.   Right-handed.   Social Drivers of Health   Tobacco Use: Medium Risk (05/20/2024)   Received from [facility]   Patient History    Smoking Tobacco Use: Former    Smokeless Tobacco Use: Never    Passive Exposure: Past  Physicist, Medical Strain: Low Risk  (04/01/2024)   Received from [facility]   Overall Financial Resource Strain (CARDIA)    Difficulty of Paying Living Expenses: Not very hard  Food Insecurity: No Food Insecurity (04/01/2024)   Received from [facility]   Epic    Within the past 12 months, you worried that your food would run out before you got the money to buy more.: Never true    Within the past 12 months, the food you bought just didn't last and you didn't have money to get more.: Never true  Recent  Concern: Food Insecurity - Food Insecurity Present (03/17/2024)   Received from [facility]   Epic    Within the past 12 months, you worried that your food would run out before you got the money to buy more.: Sometimes true    Within the past 12 months, the food you bought just didn't last and you didn't have money to get more.: Never true  Transportation Needs: No Transportation Needs (04/01/2024)   Received from [facility] - Transportation    In the past 12 months, has lack of transportation kept you from medical appointments or from getting medications?: No    Lack of Transportation (Non-Medical): No  Physical Activity: Insufficiently Active (03/05/2023)   Received from [facility]   Exercise Vital Sign    On average, how many days per week do you engage in moderate to strenuous  exercise (like a brisk walk)?: 7 days    On average, how many minutes do you engage in exercise at this level?: 20 min  Stress: Stress Concern Present (03/05/2023)   Received from [facility] - Occupational Stress Questionnaire    Feeling of Stress : To some extent  Social Connections: Moderately Isolated (03/05/2023)   Received from [facility]   Social Connection and Isolation Panel    In a typical week, how many times do you talk on the phone with family, friends, or neighbors?: More than three times a week    How often do you get together with friends or relatives?: More than three times a week    How often do you attend church or religious services?: More than 4 times per year    Do you belong to any clubs or organizations such as church groups, unions, fraternal or athletic groups, or school groups?: No    How often do you attend meetings of the clubs or organizations you belong to?: Never    Are you married, widowed, divorced, separated, never married, or living with a  partner?: Divorced  Intimate Partner Violence: Not At Risk (01/03/2023)   Humiliation, Afraid, Rape, and Kick questionnaire    Fear of Current or Ex-Partner: No    Emotionally Abused: No    Physically Abused: No    Sexually Abused: No  Depression (PHQ2-9): Not on file  Alcohol Screen: Not on file  Housing: Low Risk  (09/29/2024)   Received from [facility]   Epic    In the last 12 months, was there a time when you were not able to pay the mortgage or rent on time?: No    In the past 12 months, how many times have you moved where you were living?: 0    At any time in the past 12 months, were you homeless or living in a shelter (including now)?: No  Utilities: Not At Risk (04/01/2024)   Received from [facility]   Epic    In the past 12 months has the electric, gas, oil, or water  company threatened to shut off services in your home?: No  Health Literacy: Adequate Health Literacy (03/05/2023)   Received from [facility]   B1300 Health Literacy    Frequency of need for help with medical instructions: Never   ROS: All systems reviewed and negative except as per HPI.   Current Outpatient Medications  Medication Sig Dispense Refill   ACCU-CHEK GUIDE TEST test strip 1 each by Other route as needed for other.     amiodarone  (PACERONE ) 100 MG tablet Take 1 tablet (100 mg total) by mouth daily. 30 tablet 11   aspirin  EC 81 MG tablet Take 1 tablet (81 mg total) by mouth daily. Swallow whole. 30 tablet 12   busPIRone (BUSPAR) 5 MG tablet Take 5 mg by mouth 2 (two) times daily.     cholecalciferol  (VITAMIN D3) 25 MCG (1000 UNIT) tablet Take 1,000 Units by mouth daily.     estradiol (ESTRACE) 0.1 MG/GM vaginal cream Place 1 Applicatorful vaginally daily. (Patient not taking: Reported on 07/23/2024)     gabapentin (NEURONTIN) 100 MG capsule Take 100 mg by mouth 2 (two) times daily.     Galcanezumab -gnlm (EMGALITY ) 120 MG/ML SOAJ Inject 120 mg into  the skin every 30 (thirty) days. 1.12 mL 11   Multiple Vitamins-Minerals (MULTIVITAMIN WITH  MINERALS) tablet Take 1 tablet by mouth daily. Woman     nortriptyline  (PAMELOR ) 25 MG capsule Take 1 capsule (25 mg total) by mouth at bedtime. 30 capsule 11   Omega-3 Fatty Acids (FISH OIL) 1000 MG CAPS Take 100 mg by mouth daily.     potassium chloride  (KLOR-CON  M) 20 MEQ tablet Take 1 tablet (20 mEq total) by mouth daily. 90 tablet 1   spironolactone  (ALDACTONE ) 25 MG tablet Take 1 tablet (25 mg total) by mouth daily. 30 tablet 5   STUDY - LIBREXIA-AF - apixaban  5 mg or placebo capsule (PI-Sethi) Take 1 capsule (5 mg total) by mouth 2 (two) times daily. Take at approximately the same time of day with or without food. Please bring bottle back with you to every visit; do not discard bottle. For Investigational Use only. 420 capsule 0   STUDY - LIBREXIA-AF - [id number] (Milvexian) 100 mg or placebo tablet (PI-Sethi) Take 1 tablet by mouth 2 (two) times daily. Take at approximately the same time of day with or without food. Please bring bottle back with you to every visit; do not discard bottle. For Investigational Use only. 420 tablet 0   torsemide  (DEMADEX ) 20 MG tablet Take 40 mg in am and 20 mg in pm 270 tablet 3   ursodiol (ACTIGALL) 300 MG capsule Take 300 mg by mouth 3 (three) times daily.     No current facility-administered medications for this visit.   There were no vitals taken for this visit.  Physical exam:  General:  *** appearing.  No respiratory difficulty Neck: JVD *** cm.  Cor: Regular rate & rhythm. No murmurs. Lungs: clear Extremities: no edema  Neuro: alert & oriented x 3. Affect pleasant.   Assessment/Plan:  1. Hypertrophic cardiomyopathy: She had a pathogenic MYBPC3 mutation as does her mother. Nonobstructive HCM variant.  She had a cardiac MRI in 5/21 that showed LV EF 67%, moderate asymmetric hypertrophy of the mid septum, large area of mid-wall LGE in the mid septum (>15% of  myocardium), ECV 22%, RV EF 70%.  She has a Medtronic ICD.  She has not been found to have LV outflow gradient or mitral valve SAM. Last study was a TEE in 10/25 showing EF 50%, septal-lateral dyssynchrony, moderate asymmetric hypertrophy of the basal septum with no LVOT gradient or mitral valve SAM, no MR, RV-RA gradient 43 mmHg, mild RV enlargement with normal RV systolic function, moderate-severe TR.  She is not currently volume overloaded by exam or Optivol, NYHA class I-II.  - Unable to obtain SGLT2 inhibitor as it is prohibitively expensive via her insurance.  - Continue torsemide  40 mg bid, BMET/BNP today.  - Continue spironolactone  25 mg daily - She has an appointment with Dr. Fairy for genetic counseling (MYBPC3 mutation).  - She is RV pacing 64% of the time with mildly depressed EF and CHF.  She will see Dr. Inocencio in February, would consider CRT upgrade.  2. Tricuspid regurgitation: Moderate to severe with mild RV enlargement on TEE in 10/25.  Better control of RV failure with higher dose of torsemide , currently NYHA class I-II.  Will need to follow this closely.  She has had a prior sternotomy for removal of aortic fibroelastoma, so repeat sternotomy for tricuspid repair would be high risk. She may be a candidate for a percutaneous tricuspid repair in future though she has an ICD lead across the valve.   3. Junctional bradycardia/long pauses: Medtronic dual chamber PPM. She is pacer dependent, A-pacing 96%  and RV-pacing 64% of the time.  - As above, consider CRT upgrade with mildly decreased EF and CHF.  4. Atrial fibrillation: Paroxysmal.  S/p AF ablation x 2 and Maze with LA appendage clipping.  A-paced today.  - Continue amiodarone  100 mg daily.  Check LFTs and TSH today.  She will need a regular eye exam.  - Continue apixaban  vs study drug.  5. S/p papillary fibroelastoma excision from aortic valve. AoV looked ok on 10/25 TEE.  6. VT/NSVT: On amiodarone  and has MDT ICD.  - No recent  episodes 7. H/o elevated LFTs: Biopsy in 8/25 showed congestive hepatopathy, do not think this was due to amiodarone .  - Check LFTs today.   Followup 3 months ***    Beckey LITTIE Coe 10/16/2024  "

## 2024-10-22 ENCOUNTER — Telehealth: Payer: Self-pay

## 2024-10-22 NOTE — Telephone Encounter (Signed)
 Called to confirm/remind patient of their appointment at the [facility] on 10/23/24.   Appointment:   [x] Confirmed  [] Left mess   [] No answer/No voice mail  [] VM Full/unable to leave message  [] Phone not in service  Patient reminded to bring all medications and/or complete list.  Confirmed patient has transportation. Gave directions, instructed to utilize valet parking.

## 2024-10-23 ENCOUNTER — Ambulatory Visit (HOSPITAL_COMMUNITY)

## 2024-10-28 ENCOUNTER — Ambulatory Visit: Admitting: Cardiology

## 2024-10-28 ENCOUNTER — Ambulatory Visit: Admitting: Adult Health

## 2024-11-11 ENCOUNTER — Ambulatory Visit (HOSPITAL_COMMUNITY)

## 2024-11-18 ENCOUNTER — Ambulatory Visit: Admitting: Adult Health

## 2024-12-19 ENCOUNTER — Ambulatory Visit: Payer: 59
